# Patient Record
Sex: Female | Born: 1970
Health system: Southern US, Community
[De-identification: ages and names within clinical notes are randomized; demographics above are authoritative.]

## PROBLEM LIST (undated history)

## (undated) DIAGNOSIS — F429 Obsessive-compulsive disorder, unspecified: Secondary | ICD-10-CM

## (undated) DIAGNOSIS — F32A Depression, unspecified: Secondary | ICD-10-CM

## (undated) DIAGNOSIS — F4310 Post-traumatic stress disorder, unspecified: Secondary | ICD-10-CM

## (undated) DIAGNOSIS — F419 Anxiety disorder, unspecified: Secondary | ICD-10-CM

## (undated) DIAGNOSIS — S14106A Unspecified injury at C6 level of cervical spinal cord, initial encounter: Secondary | ICD-10-CM

## (undated) DIAGNOSIS — F329 Major depressive disorder, single episode, unspecified: Secondary | ICD-10-CM

## (undated) HISTORY — PX: BACK SURGERY: SHX140

## (undated) HISTORY — PX: NECK SURGERY: SHX720

---

## 2000-02-01 ENCOUNTER — Other Ambulatory Visit: Admission: RE | Admit: 2000-02-01 | Discharge: 2000-02-03 | Payer: Self-pay

## 2001-04-11 ENCOUNTER — Other Ambulatory Visit: Admission: RE | Admit: 2001-04-11 | Discharge: 2001-04-11 | Payer: Self-pay | Admitting: Internal Medicine

## 2001-06-26 ENCOUNTER — Emergency Department (HOSPITAL_COMMUNITY): Admission: EM | Admit: 2001-06-26 | Discharge: 2001-06-27 | Payer: Self-pay | Admitting: Emergency Medicine

## 2001-06-26 ENCOUNTER — Encounter: Payer: Self-pay | Admitting: *Deleted

## 2001-07-10 ENCOUNTER — Ambulatory Visit (HOSPITAL_COMMUNITY): Admission: RE | Admit: 2001-07-10 | Discharge: 2001-07-10 | Payer: Self-pay | Admitting: General Surgery

## 2001-07-11 ENCOUNTER — Ambulatory Visit (HOSPITAL_COMMUNITY): Admission: RE | Admit: 2001-07-11 | Discharge: 2001-07-11 | Payer: Self-pay | Admitting: General Surgery

## 2001-07-11 ENCOUNTER — Encounter: Payer: Self-pay | Admitting: General Surgery

## 2001-07-18 ENCOUNTER — Encounter: Payer: Self-pay | Admitting: General Surgery

## 2001-07-18 ENCOUNTER — Ambulatory Visit (HOSPITAL_COMMUNITY): Admission: RE | Admit: 2001-07-18 | Discharge: 2001-07-18 | Payer: Self-pay | Admitting: General Surgery

## 2001-08-21 ENCOUNTER — Ambulatory Visit (HOSPITAL_COMMUNITY): Admission: RE | Admit: 2001-08-21 | Discharge: 2001-08-21 | Payer: Self-pay | Admitting: General Surgery

## 2001-08-21 ENCOUNTER — Encounter: Payer: Self-pay | Admitting: General Surgery

## 2001-10-09 ENCOUNTER — Inpatient Hospital Stay (HOSPITAL_COMMUNITY)
Admission: AD | Admit: 2001-10-09 | Discharge: 2002-01-25 | Payer: Self-pay | Admitting: Physical Medicine & Rehabilitation

## 2001-11-14 ENCOUNTER — Encounter: Payer: Self-pay | Admitting: Urology

## 2001-12-28 ENCOUNTER — Encounter: Payer: Self-pay | Admitting: Physical Medicine & Rehabilitation

## 2002-01-10 ENCOUNTER — Encounter: Payer: Self-pay | Admitting: Physical Medicine & Rehabilitation

## 2002-01-17 ENCOUNTER — Encounter: Payer: Self-pay | Admitting: Physical Medicine & Rehabilitation

## 2002-01-25 ENCOUNTER — Encounter: Payer: Self-pay | Admitting: Physical Medicine & Rehabilitation

## 2002-04-04 ENCOUNTER — Ambulatory Visit (HOSPITAL_COMMUNITY): Admission: RE | Admit: 2002-04-04 | Discharge: 2002-04-04 | Payer: Self-pay | Admitting: Internal Medicine

## 2002-04-04 ENCOUNTER — Encounter: Payer: Self-pay | Admitting: Internal Medicine

## 2002-04-17 ENCOUNTER — Encounter: Payer: Self-pay | Admitting: Internal Medicine

## 2002-04-17 ENCOUNTER — Ambulatory Visit (HOSPITAL_COMMUNITY): Admission: RE | Admit: 2002-04-17 | Discharge: 2002-04-17 | Payer: Self-pay | Admitting: Internal Medicine

## 2002-05-23 ENCOUNTER — Emergency Department (HOSPITAL_COMMUNITY): Admission: EM | Admit: 2002-05-23 | Discharge: 2002-05-23 | Payer: Self-pay | Admitting: Emergency Medicine

## 2002-06-07 ENCOUNTER — Emergency Department (HOSPITAL_COMMUNITY): Admission: EM | Admit: 2002-06-07 | Discharge: 2002-06-07 | Payer: Self-pay | Admitting: Emergency Medicine

## 2012-05-04 DIAGNOSIS — H6120 Impacted cerumen, unspecified ear: Secondary | ICD-10-CM | POA: Diagnosis not present

## 2012-05-04 DIAGNOSIS — IMO0002 Reserved for concepts with insufficient information to code with codable children: Secondary | ICD-10-CM | POA: Diagnosis not present

## 2012-08-07 DIAGNOSIS — IMO0002 Reserved for concepts with insufficient information to code with codable children: Secondary | ICD-10-CM | POA: Diagnosis not present

## 2012-08-07 DIAGNOSIS — F3342 Major depressive disorder, recurrent, in full remission: Secondary | ICD-10-CM | POA: Diagnosis not present

## 2013-03-14 DIAGNOSIS — IMO0002 Reserved for concepts with insufficient information to code with codable children: Secondary | ICD-10-CM | POA: Diagnosis not present

## 2013-03-14 DIAGNOSIS — Z79899 Other long term (current) drug therapy: Secondary | ICD-10-CM | POA: Diagnosis not present

## 2013-03-14 DIAGNOSIS — G8929 Other chronic pain: Secondary | ICD-10-CM | POA: Diagnosis not present

## 2013-03-14 DIAGNOSIS — F411 Generalized anxiety disorder: Secondary | ICD-10-CM | POA: Diagnosis not present

## 2013-10-02 DIAGNOSIS — F411 Generalized anxiety disorder: Secondary | ICD-10-CM | POA: Diagnosis not present

## 2013-10-02 DIAGNOSIS — Z6825 Body mass index (BMI) 25.0-25.9, adult: Secondary | ICD-10-CM | POA: Diagnosis not present

## 2014-03-25 DIAGNOSIS — IMO0002 Reserved for concepts with insufficient information to code with codable children: Secondary | ICD-10-CM | POA: Diagnosis not present

## 2014-03-25 DIAGNOSIS — R51 Headache: Secondary | ICD-10-CM | POA: Diagnosis not present

## 2014-03-25 DIAGNOSIS — G8929 Other chronic pain: Secondary | ICD-10-CM | POA: Diagnosis not present

## 2014-08-22 DIAGNOSIS — R51 Headache: Secondary | ICD-10-CM | POA: Diagnosis not present

## 2014-08-22 DIAGNOSIS — G43909 Migraine, unspecified, not intractable, without status migrainosus: Secondary | ICD-10-CM | POA: Diagnosis not present

## 2014-08-28 ENCOUNTER — Emergency Department (HOSPITAL_COMMUNITY)
Admission: EM | Admit: 2014-08-28 | Discharge: 2014-08-28 | Disposition: A | Discharge status: Home / Self Care | Payer: Medicare Other | Attending: Emergency Medicine | Admitting: Emergency Medicine

## 2014-08-28 ENCOUNTER — Encounter (HOSPITAL_COMMUNITY): Payer: Self-pay | Admitting: Emergency Medicine

## 2014-08-28 DIAGNOSIS — S6990XA Unspecified injury of unspecified wrist, hand and finger(s), initial encounter: Secondary | ICD-10-CM | POA: Diagnosis not present

## 2014-08-28 DIAGNOSIS — S41112A Laceration without foreign body of left upper arm, initial encounter: Secondary | ICD-10-CM

## 2014-08-28 DIAGNOSIS — W261XXA Contact with sword or dagger, initial encounter: Secondary | ICD-10-CM | POA: Diagnosis not present

## 2014-08-28 DIAGNOSIS — Z8659 Personal history of other mental and behavioral disorders: Secondary | ICD-10-CM | POA: Diagnosis not present

## 2014-08-28 DIAGNOSIS — T148XXA Other injury of unspecified body region, initial encounter: Secondary | ICD-10-CM | POA: Diagnosis not present

## 2014-08-28 DIAGNOSIS — Z87828 Personal history of other (healed) physical injury and trauma: Secondary | ICD-10-CM | POA: Diagnosis not present

## 2014-08-28 DIAGNOSIS — S51809A Unspecified open wound of unspecified forearm, initial encounter: Secondary | ICD-10-CM | POA: Diagnosis not present

## 2014-08-28 DIAGNOSIS — W260XXA Contact with knife, initial encounter: Secondary | ICD-10-CM | POA: Insufficient documentation

## 2014-08-28 DIAGNOSIS — S59909A Unspecified injury of unspecified elbow, initial encounter: Secondary | ICD-10-CM | POA: Diagnosis not present

## 2014-08-28 DIAGNOSIS — Z23 Encounter for immunization: Secondary | ICD-10-CM | POA: Insufficient documentation

## 2014-08-28 DIAGNOSIS — Y929 Unspecified place or not applicable: Secondary | ICD-10-CM | POA: Insufficient documentation

## 2014-08-28 DIAGNOSIS — Y9389 Activity, other specified: Secondary | ICD-10-CM | POA: Insufficient documentation

## 2014-08-28 HISTORY — DX: Unspecified injury at c6 level of cervical spinal cord, initial encounter: S14.106A

## 2014-08-28 HISTORY — DX: Major depressive disorder, single episode, unspecified: F32.9

## 2014-08-28 HISTORY — DX: Depression, unspecified: F32.A

## 2014-08-28 MED ORDER — BACITRACIN ZINC 500 UNIT/GM EX OINT
TOPICAL_OINTMENT | CUTANEOUS | Status: AC
  Administered 2014-08-28: 1
  Filled 2014-08-28: qty 2.7

## 2014-08-28 MED ORDER — LIDOCAINE-EPINEPHRINE (PF) 1 %-1:200000 IJ SOLN
20.0000 mL | Freq: Once | INTRAMUSCULAR | Status: DC
  Filled 2014-08-28: qty 10

## 2014-08-28 MED ORDER — POVIDONE-IODINE 10 % EX SOLN
CUTANEOUS | Status: DC
Start: 2014-08-28 — End: 2014-08-28
  Filled 2014-08-28: qty 118

## 2014-08-28 MED ORDER — TETANUS-DIPHTH-ACELL PERTUSSIS 5-2.5-18.5 LF-MCG/0.5 IM SUSP
0.5000 mL | Freq: Once | INTRAMUSCULAR | Status: AC
  Administered 2014-08-28: 0.5 mL via INTRAMUSCULAR
  Filled 2014-08-28: qty 0.5

## 2014-08-28 MED ORDER — LIDOCAINE-EPINEPHRINE (PF) 1 %-1:200000 IJ SOLN
INTRAMUSCULAR | Status: AC
  Filled 2014-08-28: qty 10

## 2014-08-28 NOTE — ED Notes (Signed)
Non-adherent dressing with Kerlix applied to sutured wound.

## 2014-08-28 NOTE — ED Notes (Signed)
Laceration to left forearm with knife while cutting potato.  Bleeding controlled.

## 2014-08-28 NOTE — ED Provider Notes (Signed)
TIME SEEN: 11:48 AM  This chart was scribed for Natasha Maw Ward, DO by Annye Asa, ED Scribe. This patient was seen in room APA07/APA07 and the patient's care was started at 11:48 PM.   CHIEF COMPLAINT: laceration to the left forearm  HPI:  HPI Comments: Natasha [name] is a 43 y.o. female with a history of a C6 spinal cord injury who presents to the Emergency Department complaining of laceration on her left forearm that occurred PTA. Patient notes that she was peeling potatoes and slipped accidentally, cutting her forearm. There is associated controlled bleeding. She denies any other injury. She has a history of spinal cord injury and reports a baseline weakness in her upper extremities. Because of this weakness she has to cut vegetables with them against her arm and she cannot hold them with her hand. Patient notes that she is not UTD on her TDP vaccine. She reports that she has a history of depression but explained that this was an intentional. No SI or HI. No new focal numbness or weakness.   ROS: See HPI Constitutional: no fever  Eyes: no drainage  ENT: no runny nose   Cardiovascular:  no chest pain  Resp: no SOB  GI: no vomiting GU: no dysuria Integumentary: no rash  Allergy: no hives  Musculoskeletal: no leg swelling  Neurological: no slurred speech ROS otherwise negative  PAST MEDICAL HISTORY/PAST SURGICAL HISTORY:  Past Medical History  Diagnosis Date  . C6 spinal cord injury   . Depression     MEDICATIONS:  Prior to Admission medications   Not on File    ALLERGIES:  No Known Allergies  SOCIAL HISTORY:  History  Substance Use Topics  . Smoking status: Never Smoker   . Smokeless tobacco: Not on file  . Alcohol Use: No    FAMILY HISTORY: No family history on file.  EXAM: BP 119/93  Pulse 92  Temp(Src) 98.2 F (36.8 C) (Oral)  Resp 16  Ht 5' (1.524 m)  Wt 125 lb (56.7 kg)  BMI 24.41 kg/m2  SpO2 100%  LMP 08/14/2014 CONSTITUTIONAL: Alert and  oriented and responds appropriately to questions. Well-appearing; well-nourished HEAD: Normocephalic EYES: Conjunctivae clear, PERRL ENT: normal nose; no rhinorrhea; moist mucous membranes; pharynx without lesions noted NECK: Supple, no meningismus, no LAD  CARD: RRR; S1 and S2 appreciated; no murmurs, no clicks, no rubs, no gallops RESP: Normal chest excursion without splinting or tachypnea; breath sounds clear and equal bilaterally; no wheezes, no rhonchi, no rales,  ABD/GI: Normal bowel sounds; non-distended; soft, non-tender, no rebound, no guarding BACK:  The back appears normal and is non-tender to palpation, there is no CVA tenderness EXT: Normal ROM in all joints; non-tender to palpation; no edema; normal capillary refill; no cyanosis    SKIN: Normal color for age and race; warm, 4 cm superficial laceration on the volar aspect of the proximal left forearm that is hemostatic; normal sensation throughout the extremity; slightly decreased grip strength (baseline due to previous spinal injury); 2+ radial pulses bilaterally NEURO: Moves all extremities equally PSYCH: The patient's mood and manner are appropriate. Grooming and personal hygiene are appropriate. No SI or HI. Contracts for safety.  MEDICAL DECISION MAKING: Patient here with laceration to her left upper extremity. Wound is hemostatic and clean. Have cleaned the wound, irrigated and sutured using 9 prolene simple interrupted sutures.  Have updated patient's tetanus. No other injuries on exam. She is at her neurologic baseline. Normal pulses and capillary refill. We'll discharge him  with instructions for wound care and return precautions. She verbalized understanding and is comfortable with plan.    LACERATION REPAIR PROCEDURE NOTE The patient's identification was confirmed and consent was obtained. This procedure was performed by Natasha Maw Ward, DO at 12:15 PM. Site: Left volar forearm Sterile procedures observed Anesthetic used  (type and amt): 10 mL 1% lidocaine with epinephrine Suture type/size: 3-0 and 5-0 prolene Length:4 cm # of Sutures: 9 Technique: Simple interrupted Complexity superficial Antibx ointment applied Tetanus ordered Site anesthetized, irrigated with NS, explored without evidence of foreign body, wound well approximated, site covered with dry, sterile dressing.  Patient tolerated procedure well without complications. Instructions for care discussed verbally and patient provided with additional written instructions for homecare and f/u.     I personally performed the services described in this documentation, which was scribed in my presence. The recorded information has been reviewed and is accurate.    Natasha Maw Ward, DO 08/28/14 1453

## 2014-08-28 NOTE — ED Notes (Signed)
MD at bedside. 

## 2014-08-28 NOTE — Discharge Instructions (Signed)

## 2014-08-28 NOTE — ED Notes (Signed)
Pt presents to ED with an approx 2 cm laceration to LT upper forearm. Bleeding controlled at this time, cap refill brisk with strong radial pulse. NAD noted.

## 2014-09-06 DIAGNOSIS — IMO0002 Reserved for concepts with insufficient information to code with codable children: Secondary | ICD-10-CM | POA: Diagnosis not present

## 2014-09-06 DIAGNOSIS — M62838 Other muscle spasm: Secondary | ICD-10-CM | POA: Diagnosis not present

## 2014-09-06 DIAGNOSIS — Z4802 Encounter for removal of sutures: Secondary | ICD-10-CM | POA: Diagnosis not present

## 2015-01-09 DIAGNOSIS — Z6824 Body mass index (BMI) 24.0-24.9, adult: Secondary | ICD-10-CM | POA: Diagnosis not present

## 2015-01-09 DIAGNOSIS — F419 Anxiety disorder, unspecified: Secondary | ICD-10-CM | POA: Diagnosis not present

## 2015-01-09 DIAGNOSIS — G43909 Migraine, unspecified, not intractable, without status migrainosus: Secondary | ICD-10-CM | POA: Diagnosis not present

## 2015-05-08 DIAGNOSIS — F419 Anxiety disorder, unspecified: Secondary | ICD-10-CM | POA: Diagnosis not present

## 2015-05-08 DIAGNOSIS — Z6824 Body mass index (BMI) 24.0-24.9, adult: Secondary | ICD-10-CM | POA: Diagnosis not present

## 2015-06-09 ENCOUNTER — Inpatient Hospital Stay (HOSPITAL_COMMUNITY)
Admission: AD | Admit: 2015-06-09 | Discharge: 2015-06-25 | DRG: 885 | Disposition: A | Discharge status: Home / Self Care | Payer: Medicare Other | Attending: Psychiatry | Admitting: Psychiatry

## 2015-06-09 ENCOUNTER — Encounter (HOSPITAL_COMMUNITY): Payer: Self-pay | Admitting: *Deleted

## 2015-06-09 DIAGNOSIS — F322 Major depressive disorder, single episode, severe without psychotic features: Secondary | ICD-10-CM

## 2015-06-09 DIAGNOSIS — F332 Major depressive disorder, recurrent severe without psychotic features: Secondary | ICD-10-CM | POA: Diagnosis not present

## 2015-06-09 DIAGNOSIS — G4700 Insomnia, unspecified: Secondary | ICD-10-CM | POA: Diagnosis present

## 2015-06-09 DIAGNOSIS — F410 Panic disorder [episodic paroxysmal anxiety] without agoraphobia: Secondary | ICD-10-CM | POA: Diagnosis present

## 2015-06-09 DIAGNOSIS — G43909 Migraine, unspecified, not intractable, without status migrainosus: Secondary | ICD-10-CM | POA: Diagnosis present

## 2015-06-09 DIAGNOSIS — F329 Major depressive disorder, single episode, unspecified: Secondary | ICD-10-CM | POA: Diagnosis present

## 2015-06-09 DIAGNOSIS — R45851 Suicidal ideations: Secondary | ICD-10-CM | POA: Diagnosis not present

## 2015-06-09 MED ORDER — ALUM & MAG HYDROXIDE-SIMETH 200-200-20 MG/5ML PO SUSP
30.0000 mL | ORAL | Status: DC | PRN
  Administered 2015-06-13 – 2015-06-25 (×2): 30 mL via ORAL
  Filled 2015-06-09 (×2): qty 30

## 2015-06-09 MED ORDER — ACETAMINOPHEN 325 MG PO TABS: 650.0000 mg | ORAL_TABLET | Freq: Four times a day (QID) | ORAL | Status: DC | PRN

## 2015-06-09 MED ORDER — MAGNESIUM HYDROXIDE 400 MG/5ML PO SUSP: 30.0000 mL | Freq: Every day | ORAL | Status: DC | PRN

## 2015-06-09 MED ORDER — TRAZODONE HCL 50 MG PO TABS
50.0000 mg | ORAL_TABLET | Freq: Every evening | ORAL | Status: DC | PRN
  Administered 2015-06-09 – 2015-06-10 (×2): 50 mg via ORAL
  Filled 2015-06-09 (×6): qty 1

## 2015-06-09 MED ORDER — TRAZODONE HCL 50 MG PO TABS
50.0000 mg | ORAL_TABLET | Freq: Every day | ORAL | Status: DC
  Administered 2015-06-09: 50 mg via ORAL
  Filled 2015-06-09 (×2): qty 1

## 2015-06-09 NOTE — Progress Notes (Signed)
[facility] Group Notes:  (Nursing/MHT/Case Management/Adjunct)  Date:  06/09/2015  Time:  8:56 PM  Type of Therapy:  Psychoeducational Skills  Participation Level:  Active  Participation Quality:  Appropriate  Affect:  Appropriate  Cognitive:  Appropriate  Insight:  Appropriate  Engagement in Group:  Engaged  Modes of Intervention:  Discussion  Summary of Progress/Problems: Tonight in wrap up group Victorino DikeJennifer said that today started low for her but she was brought here and she realizes she needs the help so she wants to stay positive about that. She mentioned how everyone (staff and patients) are extremely nice to her so that has helped her to get adjusted.  Madaline SavageDiamond N Criston Chancellor 06/09/2015, 8:56 PM

## 2015-06-09 NOTE — Progress Notes (Signed)
D: Patient in room on approach. Pt mood and affect appeared depressed and flat. Pt report she has been off her medication for about a year. Pt reports she wanted to see how she can do off her medication. Pt reports history of suicide attempt and does not want to progress to an actual plan to hurt herself. Pt verbally contract to come to staff if feeling unsafe. Pt denies HI/AVH and pain. Pt attended evening wrap up group and engage in discussion. Pt denies any needs or concerns.  Cooperative with assessment.    A: Met with pt 1:1. Medications administered as prescribed. Support and encouragement provided. Pt encouraged to discuss feelings and come to staff with any question or concerns.   R: Patient  is safe and complaint with medications.

## 2015-06-09 NOTE — Progress Notes (Signed)
D: Pt is 44 y/o caucasian female [facility] with depression and SI. Pt Presents with flat affect and depressed mood on initial contact. Pt endorsed SI, verbally contracts for safety while in hospital, stated "I don't feel safe at home". Pt denied HI and AVH.  Pt has a h/o MDD for 23 years with cutting and past suicide attempts. Per chart and as confirmed with pt, last suicide attempt was in 2002 when she jumped off a parking garage [facility] which resulted in physical limitations due to a Spinal Cord Injury. Pt reported not seeing her psychiatrist X 6 months or greater, stated she was feeling good so she stopped seeing him and she was having problems with transportation as well due to her mother being ill with parkinson disease and was worried about safety as her mother is her main source of transport.  A: Skin assessment and search done as per protocol. Unresolved redness noted on right foot for which pt has been using vicks vapor rubb. One tattoo noted on left ankle. No contraband found in pt's belongings. Availability and emotional support offered. Pt encouraged to voice needs and concerns to promote safety. Unit orientation and schedules discussed with pt. Supper and fluids given. Q 15 minutes checks maintained for safety as ordered and pt monitored as such without falls or outburst thus far. R: Pt receptive to care. Verbalized understanding on education and admission packets. Denies adverse medication reactions from home meds (Klonopin, Flexaril and Immitrex). Remains safe on unit. Continue POC.

## 2015-06-09 NOTE — Tx Team (Signed)
Initial Interdisciplinary Treatment Plan   PATIENT STRESSORS: Financial difficulties Health problems Medication change or noncompliance Traumatic event---Death of father last week.   PATIENT STRENGTHS: Ability for insight Average or above average intelligence Capable of independent living Communication skills General fund of knowledge Motivation for treatment/growth Work skills   PROBLEM LIST: Problem List/Patient Goals Date to be addressed Date deferred Reason deferred Estimated date of resolution  "I need to get back on my medicines, I've not seen a psychiatrist in 6 months" 06-09-15     Alteration in mood (Depression) 06-09-15     Risk of self injurious behavior (SI, previous attempts, cutting) 06-09-15                                          DISCHARGE CRITERIA:  Ability to meet basic life and health needs Adequate post-discharge living arrangements Improved stabilization in mood, thinking, and/or behavior Medical problems require only outpatient monitoring Motivation to continue treatment in a less acute level of care Reduction of life-threatening or endangering symptoms to within safe limits Verbal commitment to aftercare and medication compliance  PRELIMINARY DISCHARGE PLAN: Outpatient therapy Return to previous living arrangement  PATIENT/FAMIILY INVOLVEMENT: This treatment plan has been presented to and reviewed with the patient, Natasha [name].  Sherryl Manges 06/09/2015, 7:34 PM
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
## 2015-06-10 ENCOUNTER — Encounter (HOSPITAL_COMMUNITY): Payer: Self-pay | Admitting: Psychiatry

## 2015-06-10 DIAGNOSIS — R45851 Suicidal ideations: Secondary | ICD-10-CM

## 2015-06-10 DIAGNOSIS — F332 Major depressive disorder, recurrent severe without psychotic features: Principal | ICD-10-CM

## 2015-06-10 LAB — URINALYSIS W MICROSCOPIC (NOT AT ARMC)
Bilirubin Urine: NEGATIVE
Glucose, UA: NEGATIVE mg/dL
Hgb urine dipstick: NEGATIVE
Ketones, ur: >80 mg/dL — AB
Leukocytes, UA: NEGATIVE
Nitrite: NEGATIVE
Protein, ur: NEGATIVE mg/dL
Specific Gravity, Urine: 1.016 (ref 1.005–1.030)
Urobilinogen, UA: 0.2 mg/dL (ref 0.0–1.0)
pH: 6 (ref 5.0–8.0)

## 2015-06-10 MED ORDER — BUPROPION HCL ER (XL) 150 MG PO TB24
150.0000 mg | ORAL_TABLET | Freq: Every day | ORAL | Status: DC
  Administered 2015-06-10 – 2015-06-13 (×4): 150 mg via ORAL
  Filled 2015-06-10 (×7): qty 1

## 2015-06-10 MED ORDER — CLONAZEPAM 1 MG PO TABS
1.0000 mg | ORAL_TABLET | Freq: Two times a day (BID) | ORAL | Status: DC
  Administered 2015-06-10 – 2015-06-11 (×2): 1 mg via ORAL
  Filled 2015-06-10 (×2): qty 1

## 2015-06-10 MED ORDER — SUMATRIPTAN SUCCINATE 25 MG PO TABS
25.0000 mg | ORAL_TABLET | Freq: Two times a day (BID) | ORAL | Status: DC | PRN
  Administered 2015-06-10 – 2015-06-25 (×15): 25 mg via ORAL
  Filled 2015-06-10 (×15): qty 1
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
## 2015-06-10 NOTE — Progress Notes (Signed)
[facility] Group Notes:  (Nursing/MHT/Case Management/Adjunct)  Date:  06/10/2015  Time:  8:53 PM  Type of Therapy:  Psychoeducational Skills  Participation Level:  Active  Participation Quality:  Appropriate  Affect:  Appropriate  Cognitive:  Appropriate  Insight:  Good  Engagement in Group:  Engaged  Modes of Intervention:  Discussion  Summary of Progress/Problems: Tonight in wrap up group Marsia stated that her day was very up and down for her. Right now she said she's at a 4, being around the group she said she does better. She said she talked a lot more today and the therapy dog was a plus as well.  Madaline SavageDiamond N Keitha Kolk 06/10/2015, 8:53 PM

## 2015-06-10 NOTE — [facility] Suicide Risk Assessment (Signed)
[facility] Admission Suicide Risk Assessment   Nursing information obtained from:    Demographic factors:   44 year old female , lives with mother  Current Mental Status:   see below Loss Factors:   neurologic sequelae from spinal injury, loss of father, strain with brother , mother has developed Parkinson's  Historical Factors:   Depression, history of severe suicide attempt in the past  Risk Reduction Factors:   resilience  Total Time spent with patient: 45 minutes Principal Problem:  Major Depression Diagnosis:   Patient Active Problem List   Diagnosis Date Noted  . MDD (major depressive disorder) [F32.2] 06/09/2015     Continued Clinical Symptoms:  Alcohol Use Disorder Identification Test Final Score (AUDIT): 1 The "Alcohol Use Disorders Identification Test", Guidelines for Use in Primary Care, Second Edition.  World Science writer [facility]). Score between 0-7:  no or low risk or alcohol related problems. Score between 8-15:  moderate risk of alcohol related problems. Score between 16-19:  high risk of alcohol related problems. Score 20 or above:  warrants further diagnostic evaluation for alcohol dependence and treatment.   CLINICAL FACTORS:  44 year old female. On disability. Lives with mother .  States she has a history of severe depression and in 2002 had a serious suicide attempt by jumping off a garage deck, sustaining spinal cord injury which left her quadriplegic for a period of time. She has gradually improved, but  Still has quadriparesia, worse on hands. States she had been " doing OK" but has had  A series of recent stressors which has resulted in worsening depression. Her mother, with whom she lives, has developed Parkinson's and needed a brief stay in a Nursing Home. Her father, with whom she had a strained relationship, died earlier this year 04/10/23 ).  She states she developed worsening depression, some passive thoughts of dying. Denies hallucinations  She had a history  of  Responding well to Wellbutrin XL in the past, and had actually stopped medication several years ago, because she felt better, and " at the time did not think I needed medications any longer".  She has no history of seizures or eating disorder . She has been managed with Klonopin for anxiety prior to admission- denies side effects- denies abuse. States she takes 1 mgr BID ,which is less than prescribed . Dx- Major Depression, severe , recurrent, without psychotic features, quadriparesia from spinal cord injury 2002.  Plan- start Wellbutrin XL 150 mgrs QDAY- side effects reviewed, continue Klonopin 1 mgr BID, patient ambulates independently with cane .      Musculoskeletal: Strength & Muscle Tone: decreased Gait & Station: slow gait, but describes it as steady - uses cane  Patient leans: N/A  Psychiatric Specialty Exam: Physical Exam  Review of Systems  Constitutional: Negative.   HENT: Negative.   Eyes: Negative.   Cardiovascular: Negative.   Gastrointestinal: Negative.   Genitourinary: Negative.   Musculoskeletal: Negative.   Skin: Negative.   Neurological: Positive for focal weakness.  Endo/Heme/Allergies: Negative.   Psychiatric/Behavioral: Positive for depression and suicidal ideas.  all other systems negative   Blood pressure 109/79, pulse 101, temperature 98.6 F (37 C), temperature source Oral, resp. rate 14, height  (1.575 m), weight 135 lb (61.236 kg), last menstrual period 06/02/2015, SpO2 98 %.Body mass index is 24.69 kg/(m^2).  General Appearance: Fairly Groomed  Patent attorney::  Good  Speech:  Normal Rate  Volume:  Normal  Mood:  Depressed  Affect:  Constricted  and but reactive   Thought Process:  Goal Directed and Linear  Orientation:  Full (Time, Place, and Person)  Thought Content:  denies hallucinations, no delusions  Suicidal Thoughts:  Yes.  without intent/plan- denies any current plan or intention of hurting self/killing self.   Homicidal Thoughts:   No  Memory:  recent and remote grossly intact  Judgement:  Fair  Insight:  Present  Psychomotor Activity:  Decreased  Concentration:  Good  Recall:  Good  Fund of Knowledge:Good  Language: Good  Akathisia:  Negative  Handed:  Right  AIMS (if indicated):     Assets:  Communication Skills Desire for Improvement Resilience  Sleep:     Cognition: WNL  ADL's:  Fair      COGNITIVE FEATURES THAT CONTRIBUTE TO RISK:  Loss of executive function    SUICIDE RISK:   Moderate:  Frequent suicidal ideation with limited intensity, and duration, some specificity in terms of plans, no associated intent, good self-control, limited dysphoria/symptomatology, some risk factors present, and identifiable protective factors, including available and accessible social support.  PLAN OF CARE: Patient will be admitted to inpatient psychiatric unit for stabilization and safety. Will provide and encourage milieu participation. Provide medication management and maked adjustments as needed.  Will follow daily.    Medical Decision Making:  Review of Psycho-Social Stressors (1), Review or order clinical lab tests (1), Established Problem, Worsening (2) and Review of Medication Regimen & Side Effects (2)  I certify that inpatient services furnished can reasonably be expected to improve the patient's condition.   COBOS, FERNANDO 06/10/2015, 5:50 PM
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
## 2015-06-10 NOTE — Progress Notes (Signed)
Recreation Therapy Notes  Animal-Assisted Activity (AAA) Program Checklist/Progress Notes Patient Eligibility Criteria Checklist & Daily Group note for Rec Tx Intervention  Date: 06.28.16 Time: 2:45 pm Location: [address] Dayroom  AAA/T Program Assumption of Risk Form signed by Patient/ or Parent Legal Guardian yes  Patient is free of allergies or sever asthma yes  Patient reports no fear of animals yes  Patient reports no history of cruelty to animalsyes  Patient understands his/her participation is voluntary yes  Patient washes hands before animal contact yes  Patient washes hands after animal contact yes  Behavioral Response: Engaged  Education: Hand Washing, Appropriate Animal Interaction   Education Outcome: Acknowledges understanding/In group clarification offered  Clinical Observations/Feedback: Patient attended group.   Nyaira Hodgens, LRT/CTRS         Dewan Emond A 06/10/2015 4:03 PM 

## 2015-06-10 NOTE — Plan of Care (Signed)
Problem: Diagnosis: Increased Risk For Suicide Attempt Goal: STG-Patient Will Attend All Groups On The Unit Outcome: Progressing Pt seem upbeat and attended evening wrap up group and engage in discussion.

## 2015-06-10 NOTE — Tx Team (Signed)
Interdisciplinary Treatment Plan Update (Adult)  Date:  06/10/2015  Time Reviewed:  1:24 PM   Progress in Treatment: Attending groups: Patient is attending groups. Participating in groups:  Patient engages in discussion Taking medication as prescribed:  Patient is taking medications Tolerating medication:  Patient is tolerating medications Family/Significant othe contact made:   No, will asked for consent to make collateral contact Patient understands diagnosis:Yes, patient understands diagnosis and need for treatment Discussing patient identified problems/goals with staff:  Yes, patient is able to express goals/problems Medical problems stabilized or resolved:  Yes Denies suicidal/homicidal ideation: Yes, patient is denying SI/HI. Issues/concerns per patient self-inventory:   Other:  Discharge Plan or Barriers:  To be determined  Reason for Continuation of Hospitalization: Anxiety Depression Medication stabilization Suicidal ideation  Comments:  Continue medication stabilization  Additional comments:  Patient and CSW reviewed Patient Discharge Process Letter/Patient Involvement Form.  Patient verbalized understanding and signed form.  Patient and CSW also reviewed and identified patient's goals and treatment plan.  Patient verbalized understanding and agreed to plan.  Estimated length of stay:  New goal(s):  Review of initial/current patient goals per problem list:  Please see plan of careInterdisciplinary Treatment Plan Update (Adult)  Attendees: Patient 06/10/2015 1:24 PM   Family:   06/10/2015 1:24 PM   Physician:  Nehemiah MassedFernando Cobos, MD 06/10/2015 1:24 PM   Nursing:   Earl ManySara Twyman, RN 06/10/2015 1:24 PM   Clinical Social Worker:  Juline PatchQuylle Graclyn Lawther, LCSW 06/10/2015 1:24 PM   Clinical Social Worker:  Belenda CruiseKristin Drinkard, LCSW-A 06/10/2015 1:24 PM   Case Manager:  Onnie BoerJennifer [name], RN 06/10/2015 1:24 PM   Other:  Liborio NixonPatrice White, RN 06/10/2015 1:24 PM  Other:   06/10/2015  1:24 PM   Other:   06/10/2015 1:24 PM   Other:  06/10/2015 1:24 PM   Other:  06/10/2015 1:24 PM   Other:  Chad CordialValerie Enoch, [facility] Transition Team Coordinator 06/10/2015 1:24 PM   Other:   06/10/2015 1:24 PM   Other:  06/10/2015 1:24 PM   Other:   06/10/2015 1:24 PM    Scribe for Treatment Team:   Wynn BankerHodnett, Vala Raffo Hairston, 06/10/2015   1:24 PM

## 2015-06-10 NOTE — [facility] Group Notes (Signed)
[facility] LCSW Group Therapy      Feelings About Diagnosis 1:15 - 2:30 PM         06/10/2015    Type of Therapy:  Group Therapy  Participation Level:  Active  Participation Quality:  Appropriate  Affect:  Appropriate  Cognitive:  Alert and Appropriate  Insight:  Developing/Improving and Engaged  Engagement in Therapy:  Developing/Improving and Engaged  Modes of Intervention:  Discussion, Education, Exploration, Problem-Solving, Rapport Building, Support  Summary of Progress/Problems:  Patient actively participated in group. Patient discussed past and present diagnosis and the effects it has had on  life.  Patient talked about family and society being judgmental and the stigma associated with having a mental health diagnosis.  She shared she feels her illness has caused her to miss out on meeting goals she had for her life.  She stated she has often felt labeled and less than others.  She is hopeful that someday she can move forward and work toward her educational goals again.  Wynn BankerHodnett, Hatice Bubel Hairston 06/10/2015

## 2015-06-10 NOTE — Progress Notes (Signed)
Patient ID: Natasha SartoriusJenifer K [name], female   DOB: Feb 18, 1971, 44 y.o.   MRN: [id number] PER STATE REGULATIONS 482.30  THIS CHART WAS REVIEWED FOR MEDICAL NECESSITY WITH RESPECT TO THE PATIENT'S ADMISSION/DURATION OF STAY.  NEXT REVIEW DATE: 06/13/15  Loura HaltBARBARA Edrik Rundle, RN, BSN CASE MANAGER

## 2015-06-10 NOTE — Progress Notes (Signed)
Pt attended spiritual care group on grief and loss facilitated by chaplain Burnis KingfisherMatthew Jarion Hawthorne. Group opened with brief discussion and psycho-social ed around grief and loss in relationships and in relation to self - identifying life patterns, circumstances, changes that cause losses. Established group norm of speaking from own life experience. Group goal of establishing open and affirming space for members to share loss and experience with grief, normalize grief experience and provide psycho social education and grief support.  Group drew on narrative and Alderian therapeutic modalities.  Demetric spoke with group about the death of her father, describing anger and grief around who she wished her father could have been and the childhood / relationship she wish she could have had with him.  Asked the group whether it was "normal" to grief losses of "what could have been."  She received affirmation / normalization from the group.  Described feeling particularly isolated in her grief, because others in her family and community experienced her father differently.  That is, they did not know how painful her relationship with him was.   In the course of group she described his loss feeling surreal, feeling as though she "shouldn't" be angry, feeling frustration around the unfairness of death - that two of her friends who "were good people" died early in their life.   Diem was surprised that her father died three months ago, but the death feels like last week.  The facilitator and group normalized her experience, provided emapathic presence.  Several group members spoke about what keeps them going in places of isolation.     Maquita was called from room for treatment team.  She was not able to return before end of group.       06/10/15 1400  Clinical Encounter Type  Visited With Patient;Other (Comment) (Group)  Visit Type Behavioral Health;Psychological support;Spiritual support;Other (Comment) (Group)   Spiritual Encounters  Spiritual Needs Grief support;Emotional  Stress Factors  Patient Stress Factors Loss;Family relationships

## 2015-06-10 NOTE — Progress Notes (Signed)
D: Patient in dayroom interacting with peers. Pt reports her day was well. Pt reports meeting with the treatment team has given her hope that things are going in the right direction. Pt reports she happy to be back on her Klonopin. Pt mood and affect appeared depressed and flat. Pt denies SI/HI/AVH and pain verbally contract to come to staff if feeling unsafe. Pt attended evening wrap up group and engage in discussion. Pt denies any needs or concerns.  Cooperative with assessment.    A: Met with pt 1:1. Medications administered as prescribed. Support and encouragement provided. Pt encouraged to discuss feelings and come to staff with any question or concerns.   R: Patient  is safe and complaint with medications.

## 2015-06-10 NOTE — Progress Notes (Signed)
Patient ID: Natasha SartoriusJenifer K [name], female   DOB: [birth year], 44 y.o.   MRN: [id number]   Pt currently presents with a flat affect and depressed behavior. Per self inventory, pt rates depression at a 7, hopelessness 7 and anxiety 12. Pt's daily goal is to "talk to dr/resources" and they intend to do so by "be attentive." Pt reports poor sleep, good concentration, low energy and a fair appetite.   Pt provided with medications per providers orders. Pt's labs and vitals were monitored throughout the day. Pt supported emotionally and encouraged to express concerns and questions. Pt educated on medications.  Pt's safety ensured with 15 minute and environmental checks. Pt currently denies SI/HI and A/V hallucinations. Pt verbally agrees to seek staff if SI/HI or A/VH occurs and to consult with staff before acting on these thoughts. Pt refuses to go to cafeteria for lunch today and states "I'm just not hungry." Pt also refuses ensures and says, "I just really don't like them. I'll think about, okay?" Will continue POC.

## 2015-06-10 NOTE — Tx Team (Deleted)
Initial Interdisciplinary Treatment Plan   PATIENT STRESSORS: Financial difficulties Health problems Medication change or noncompliance   PATIENT STRENGTHS: Ability for insight Average or above average intelligence General fund of knowledge   PROBLEM LIST: Problem List/Patient Goals Date to be addressed Date deferred Reason deferred Estimated date of resolution  depression 06/10/2015     Suicidal ideation 06/10/2015     ( I don't feel safe " 06/10/2015     Medication noncompliant 06/10/2015                                    DISCHARGE CRITERIA:  Ability to meet basic life and health needs Improved stabilization in mood, thinking, and/or behavior Medical problems require only outpatient monitoring Motivation to continue treatment in a less acute level of care Verbal commitment to aftercare and medication compliance  PRELIMINARY DISCHARGE PLAN: Outpatient therapy Placement in alternative living arrangements Return to previous living arrangement  PATIENT/FAMIILY INVOLVEMENT: This treatment plan has been presented to and reviewed with the patient, Natasha [name], The patient and family have been given the opportunity to ask questions and make suggestions.  JEHU-APPIAH, Nochum Fenter K 06/10/2015, 5:31 AM

## 2015-06-10 NOTE — Progress Notes (Signed)
Patient ID: [name], female   DOB: 1971-07-09, 44 y.o.   MRN: [id number]  Adult Psychoeducational Group Note  Date:  06/10/2015 Time: 09:00am  Group Topic/Focus:  Orientation:   The focus of this group is to educate the patient on the purpose and policies of crisis stabilization and provide a format to answer questions about their admission.  The group details unit policies and expectations of patients while admitted.  Participation Level:  Active  Participation Quality:  Appropriate and Attentive  Affect:  Flat  Cognitive:  Alert, Appropriate and Oriented  Insight: Improving  Engagement in Group:  Engaged  Modes of Intervention:  Discussion, Education, Orientation and Support  Additional Comments:  Pt able to identify one daily goal to accomplish today.   Aurora Maskwyman, Dylan Monforte E 06/10/2015, 2:56 PM

## 2015-06-11 LAB — CBC WITH DIFFERENTIAL/PLATELET
Basophils Absolute: 0 10*3/uL (ref 0.0–0.1)
Basophils Relative: 0 % (ref 0–1)
Eosinophils Absolute: 0 10*3/uL (ref 0.0–0.7)
Eosinophils Relative: 0 % (ref 0–5)
HCT: 43.1 % (ref 36.0–46.0)
Hemoglobin: 14.7 g/dL (ref 12.0–15.0)
Lymphocytes Relative: 23 % (ref 12–46)
Lymphs Abs: 2.1 10*3/uL (ref 0.7–4.0)
MCH: 29.3 pg (ref 26.0–34.0)
MCHC: 34.1 g/dL (ref 30.0–36.0)
MCV: 86 fL (ref 78.0–100.0)
Monocytes Absolute: 0.6 10*3/uL (ref 0.1–1.0)
Monocytes Relative: 7 % (ref 3–12)
Neutro Abs: 6.3 10*3/uL (ref 1.7–7.7)
Neutrophils Relative %: 70 % (ref 43–77)
Platelets: UNDETERMINED 10*3/uL (ref 150–400)
RBC: 5.01 MIL/uL (ref 3.87–5.11)
RDW: 13.3 % (ref 11.5–15.5)
WBC: 9 10*3/uL (ref 4.0–10.5)

## 2015-06-11 LAB — TSH: TSH: 4.924 u[IU]/mL — ABNORMAL HIGH (ref 0.350–4.500)

## 2015-06-11 LAB — BASIC METABOLIC PANEL
Anion gap: 12 (ref 5–15)
BUN: 11 mg/dL (ref 6–20)
CO2: 18 mmol/L — ABNORMAL LOW (ref 22–32)
Calcium: 8.8 mg/dL — ABNORMAL LOW (ref 8.9–10.3)
Chloride: 105 mmol/L (ref 101–111)
Creatinine, Ser: 0.62 mg/dL (ref 0.44–1.00)
GFR calc Af Amer: >60 mL/min (ref >60)
GFR calc non Af Amer: >60 mL/min (ref >60)
Glucose, Bld: 77 mg/dL (ref 65–99)
Potassium: 4 mmol/L (ref 3.5–5.1)
Sodium: 135 mmol/L (ref 135–145)

## 2015-06-11 MED ORDER — CLONAZEPAM 1 MG PO TABS: 2.0000 mg | ORAL_TABLET | Freq: Every day | ORAL | Status: DC

## 2015-06-11 MED ORDER — CLONAZEPAM 1 MG PO TABS
2.0000 mg | ORAL_TABLET | Freq: Every day | ORAL | Status: DC
  Administered 2015-06-11 – 2015-06-24 (×14): 2 mg via ORAL
  Filled 2015-06-11 (×14): qty 2

## 2015-06-11 NOTE — Progress Notes (Signed)
Pt has refused to eat meals today. Pt refused breakfast, lunch and dinner. Pt also refused snacks. Pt is consuming fluids.

## 2015-06-11 NOTE — Progress Notes (Signed)
Adult Psychoeducational Group Note  Date:  06/11/2015 Time:  9:10 PM  Group Topic/Focus:  Wrap-Up Group:   The focus of this group is to help patients review their daily goal of treatment and discuss progress on daily workbooks.  Participation Level:  Active  Participation Quality:  Appropriate  Affect:  Appropriate  Cognitive:  Appropriate  Insight: Appropriate  Engagement in Group:  Engaged  Modes of Intervention:  Discussion  Additional Comments: The expressed that she attend group.The patient also said that the group made here day great. Octavio Mannshigpen, Graycen Degan Lee 06/11/2015, 9:10 PM

## 2015-06-11 NOTE — Progress Notes (Signed)
Recreation Therapy Notes  Date: 06.29.16 Time: 9:30 am Location: [address] Dayroom  Group Topic: Stress Management  Goal Area(s) Addresses:  Patient will verbalize importance of using healthy stress management.  Patient will identify positive emotions associated with healthy stress management.   Intervention: Stress Management  Activity : Progressive Muscle Relaxation. LRT introduced and educated patients on stress management technique of progressive muscle relaxation. A script was used to to deliver the technique to patients. Patients were asked to follow script read aloud by LRT to engage in the stress management technique.  Education: Stress Management, Discharge Planning.   Education Outcome: Acknowledges edcuation/In group clarification offered/Needs additional education  Clinical Observations/Feedback: Patient did not attend group.  Caroll RancherMarjette Ruhi Kopke , LRT/CTRS         Caroll RancherLindsay, [name] A 06/11/2015 3:15 PM

## 2015-06-11 NOTE — Progress Notes (Signed)
Patient ID: Natasha SartoriusJenifer K Sobieski, female   DOB: 01/28/1971, 44 y.o.   MRN: [id number]  D: Patient pleasant on approach. Reports feeling better tonight and is starting to feel tired early. Hoping with the increase in Klonopin that she will sleep well tonight. Reports had some self harm thoughts earlier today but not at present. Using crutch and wheelchair as needed. A: Staff will monitor on q 15 minute checks, follow treatment plan, and give meds as ordered. R: Cooperative on the unit. Wanted to go to bed early while she was tired

## 2015-06-11 NOTE — Progress Notes (Signed)
D: Pt presents with flat affect and depressed mood. Pt rates depression 5/10. Anxiety 4/10. Hopeless 7/10. Pt reported self harm thoughts of cutting. Pt denies suicidal thoughts. Pt verbally contracts not to harm self. Pt reported that her self harm thoughts worsens at bedtime when she is all alone. Pt reported poor sleep and stated that the trazodone makes her feel restless. Pt requesting to take klonopin at bedtime. Dr. Jama Flavorsobos made aware of pt request. Pt compliant with attending groups.  A: Medications administered as ordered per MD. Verbal support given. Pt encouraged to attend groups. Pt encouraged to report to the nurses station when self harm thoughts increase. 15 minute checks performed for safety.  R: Pt receptive to treatment.

## 2015-06-11 NOTE — [facility] Group Notes (Signed)
[facility] LCSW Aftercare Discharge Planning Group Note   06/11/2015 9:34 AM    Participation Quality:  Appropraite  Mood/Affect:  Appropriate  Depression Rating:  6  Anxiety Rating:  8  Thoughts of Suicide:  Yes  Will you contract for safety? Yes  Current AVH:  No  Plan for Discharge/Comments:  Patient attended discharge planning group and actively participated in group. She reports she was not being seen outpatient prior to admission and is requesting to be referred to [facility].  Suicide prevention education reviewed and SPE document provided.   Transportation Means: Patient has transportation.   Supports:  Patient has a support system.   Roanne Haye, Joesph JulyQuylle Hairston

## 2015-06-11 NOTE — Progress Notes (Signed)
Pt woke up c/o chest pain. Pt reports she feel anxious, has racing thoughts. Vital signs: B/P 118/81, P 75, O2 100%. Pt refuse medication and went back to bed. Will continue to monitor.

## 2015-06-11 NOTE — Progress Notes (Signed)
[facility] MD Progress Note  06/11/2015 12:53 PM LYRIK DOCKSTADER  MRN:  [id number] Subjective:   Patient reports some improvement but continues to feel depressed, sad, and at times has urges to cut self, although denies any cutting on the unit or any intention of cutting self at this time. Denies medication  Side effects at this time. Objective : I have discussed case with treatment team and have met with patient. She remains depressed, sad, but today affect somewhat more reactive . She continues to ruminate about stressors such as her mother's Parkinson's Disorder and her father's recent death. States that she had a poor relationship with father, but feels that she never got to have a sense of closure once he passed away. She is going to groups and is visible in milieu. She denies medication side effects. She ambulates slowly due to being quadriparetic, but denies falls and states she is able to manage largely independently at home . She is responsive to support , encouragement and review of coping skills. Affect tended to improve during session.  Patient insightful about her history of cutting- states she normally cuts not to attempt suicide but to " get some release from the feelings ", and also because sometimes " it's like I need to atone, for example , if I get angry with my mother and then feel guilty about it because I know she has Parkinson's and it's not her fault " She states she is trying to develop better coping skills to address negative emotions and as noted, denies any current plans or intention of cutting on unit .  Sleep is fair- does not like Trazodone . Has been on Klonopin for years, and states has no side effects from it- states she takes 2 mgrs QHS without side effects.  Of note, UA is remarkable for ketones ( patient states she has not been eating much recently) and for bacteria- no nitirites , no WBC. Patient has no symptoms of UTI or fever.  TSH slightly elevated . Principal  Problem: MDD (major depressive disorder) Diagnosis:   Patient Active Problem List   Diagnosis Date Noted  . MDD (major depressive disorder) [F32.2] 06/09/2015   Total Time spent with patient: 25 minutes    Past Medical History:  Past Medical History  Diagnosis Date  . C6 spinal cord injury   . Depression     Past Surgical History  Procedure Laterality Date  . Back surgery     Family History: History reviewed. No pertinent family history. Social History:  History  Alcohol Use No     History  Drug Use No    History   Social History  . Marital Status: Single    Spouse Name: N/A  . Number of Children: N/A  . Years of Education: N/A   Social History Main Topics  . Smoking status: Never Smoker   . Smokeless tobacco: Not on file  . Alcohol Use: No  . Drug Use: No  . Sexual Activity: Not on file   Other Topics Concern  . None   Social History Narrative   Additional History:    Sleep: Fair  Appetite:  Good   Assessment:   Musculoskeletal: Strength & Muscle Tone: [place]: slow, walks with cane Patient leans: N/A   Psychiatric Specialty Exam: Physical Exam  ROS denies fever, chills, denies dysuria, denies urgency  Blood pressure 140/67, pulse 91, temperature 98.5 F (36.9 C), temperature source Oral, resp. rate 16, height _0  (  1.575 m), weight 135 lb (61.236 kg), last menstrual period 06/02/2015, SpO2 98 %.Body mass index is 24.69 kg/(m^2).  General Appearance: improved grooming  Eye Contact::  Good  Speech:  Normal Rate  Volume:  Normal  Mood:  depressed  Affect:  constricted, but more reactive today, smiles at times appropriately  Thought Process:  Goal Directed and Linear  Orientation:  Full (Time, Place, and Person)  Thought Content:  denies hallucinations, no delusions   Suicidal Thoughts:  No- reports urges or thoughts of cutting self as a way of addressing anxiety, depression, but denies any plan or intention of hurting  self . At this time denies suicidal ideations  Homicidal Thoughts:  No  Memory:  recent and remote grossly intact   Judgement:  Other:  present  Insight:  Present  Psychomotor Activity:  walks slowly  Concentration:  Good  Recall:  Good  Fund of Knowledge:Good  Language: Good  Akathisia:  Negative  Handed:  Right  AIMS (if indicated):     Assets:  Communication Skills Desire for Improvement Resilience  ADL's:  Improving   Cognition: WNL  Sleep:        Current Medications: Current Facility-Administered Medications  Medication Dose Route Frequency Provider Last Rate Last Dose  . acetaminophen (TYLENOL) tablet 650 mg  650 mg Oral Q6H PRN Nicholaus Bloom, MD      . alum & mag hydroxide-simeth (MAALOX/MYLANTA) 200-200-20 MG/5ML suspension 30 mL  30 mL Oral Q4H PRN Nicholaus Bloom, MD      . buPROPion (WELLBUTRIN XL) 24 hr tablet 150 mg  150 mg Oral Daily [name], MD   150 mg at 06/11/15 0751  . [START ON 06/12/2015] clonazePAM (KLONOPIN) tablet 2 mg  2 mg Oral QHS Parminder Trapani A Grant Henkes, MD      . magnesium hydroxide (MILK OF MAGNESIA) suspension 30 mL  30 mL Oral Daily PRN Nicholaus Bloom, MD      . SUMAtriptan (IMITREX) tablet 25 mg  25 mg Oral Q12H PRN [name], MD   25 mg at 06/11/15 1013    Lab Results:  Results for orders placed or performed during the hospital encounter of 06/09/15 (from the past 48 hour(s))  Urinalysis with microscopic     Status: Abnormal   Collection Time: 06/10/15  7:46 PM  Result Value Ref Range   Color, Urine YELLOW YELLOW   APPearance CLOUDY (A) CLEAR   Specific Gravity, Urine 1.016 1.005 - 1.030   pH 6.0 5.0 - 8.0   Glucose, UA NEGATIVE NEGATIVE mg/dL   Hgb urine dipstick NEGATIVE NEGATIVE   Bilirubin Urine NEGATIVE NEGATIVE   Ketones, ur >80 (A) NEGATIVE mg/dL   Protein, ur NEGATIVE NEGATIVE mg/dL   Urobilinogen, UA 0.2 0.0 - 1.0 mg/dL   Nitrite NEGATIVE NEGATIVE   Leukocytes, UA NEGATIVE NEGATIVE   Bacteria, UA MANY (A) RARE    Squamous Epithelial / LPF MANY (A) RARE    Comment: Performed at [facility]  CBC with Differential/Platelet     Status: None   Collection Time: 06/11/15  6:16 AM  Result Value Ref Range   WBC 9.0 4.0 - 10.5 K/uL    Comment: WHITE COUNT CONFIRMED ON SMEAR   RBC 5.01 3.87 - 5.11 MIL/uL   Hemoglobin 14.7 12.0 - 15.0 g/dL   HCT 43.1 36.0 - 46.0 %   MCV 86.0 78.0 - 100.0 fL   MCH 29.3 26.0 - 34.0 pg   MCHC 34.1  30.0 - 36.0 g/dL   RDW 13.3 11.5 - 15.5 %   Platelets PLATELET CLUMPS NOTED ON SMEAR, UNABLE TO ESTIMATE 150 - 400 K/uL   Neutrophils Relative % 70 43 - 77 %   Lymphocytes Relative 23 12 - 46 %   Monocytes Relative 7 3 - 12 %   Eosinophils Relative 0 0 - 5 %   Basophils Relative 0 0 - 1 %   Neutro Abs 6.3 1.7 - 7.7 K/uL   Lymphs Abs 2.1 0.7 - 4.0 K/uL   Monocytes Absolute 0.6 0.1 - 1.0 K/uL   Eosinophils Absolute 0.0 0.0 - 0.7 K/uL   Basophils Absolute 0.0 0.0 - 0.1 K/uL   Smear Review PLATELET CLUMPS NOTED ON SMEAR     Comment: Performed at [place] metabolic panel     Status: Abnormal   Collection Time: 06/11/15  6:16 AM  Result Value Ref Range   Sodium 135 135 - 145 mmol/L   Potassium 4.0 3.5 - 5.1 mmol/L   Chloride 105 101 - 111 mmol/L   CO2 18 (L) 22 - 32 mmol/L   Glucose, Bld 77 65 - 99 mg/dL   BUN 11 6 - 20 mg/dL   Creatinine, Ser 0.62 0.44 - 1.00 mg/dL   Calcium 8.8 (L) 8.9 - 10.3 mg/dL   GFR calc non Af Amer >60 >60 mL/min   GFR calc Af Amer >60 >60 mL/min    Comment: (NOTE) The eGFR has been calculated using the CKD EPI equation. This calculation has not been validated in all clinical situations. eGFR's persistently <60 mL/min signify possible Chronic Kidney Disease.    Anion gap 12 5 - 15    Comment: Performed at [facility]  TSH     Status: Abnormal   Collection Time: 06/11/15  6:16 AM  Result Value Ref Range   TSH 4.924 (H) 0.350 - 4.500 uIU/mL    Comment: Performed at [facility]    Physical Findings: AIMS: Facial and Oral Movements Muscles of Facial Expression: None, normal Lips and Perioral Area: None, normal Jaw: None, normal Tongue: None, normal,Extremity Movements Upper (arms, wrists, hands, fingers): None, normal Lower (legs, knees, ankles, toes): None, normal, Trunk Movements Neck, shoulders, hips: None, normal, Overall Severity Severity of abnormal movements (highest score from questions above): None, normal Incapacitation due to abnormal movements: None, normal Patient's awareness of abnormal movements (rate only patient's report): No Awareness, Dental Status Current problems with teeth and/or dentures?: No Does patient usually wear dentures?: No  CIWA:    COWS:      Assessment - still depressed, anxious, ruminative about stressors, and endorsing thoughts of cutting, which she has a history of engaging in to address negative affects. She states she has refrained from cutting and does not have plan or intention to do so. Responds to support, and affect improves during session. Sleep is fair. Reports at home takes Klonopin 2 mgrs QHS without side effects and with much improvement of insomnia.  Tolerating medications well thus far. TSH slightly increased - UA has bacteria but no WBC and patient is asypmtomatic . At this time tolerating  Wellbutrin trial well. Has been on Wellbutrin XL in the past with no side effects and good response .   Treatment Plan Summary: Daily contact with patient to assess and evaluate symptoms and progress in treatment, Medication management, Plan continue inpatient treatment and continue medications as below  Wellbutrin XL 150 mgrs QAM to  address depression Change Klonopin to 2 mgrs QHS to address insomnia D/C Trazodone , as patient states not working well and causing her to feel " groggy". Follow up on slightly elevated TSH - will order FT3, FT4.   Medical Decision Making:  Established Problem,  Stable/Improving (1), Review of Psycho-Social Stressors (1), Review or order clinical lab tests (1) and Review of Medication Regimen & Side Effects (2)     Jonie Burdell 06/11/2015, 12:53 PM

## 2015-06-11 NOTE — [facility] Group Notes (Signed)
[facility] LCSW Group Therapy  Emotional Regulation 1:15 - 2: 30 PM        06/11/2015     Type of Therapy:  Group Therapy  Participation Level:  Appropriate  Participation Quality:  Appropriate  Affect:  Depressed  Cognitive:  Appropriate  Insight:  Developing/Improving Engaged  Engagement in Therapy:  Developing/Improving Engaged  Modes of Intervention:  Discussion Exploration Problem-Solving Supportive  Summary of Progress/Problems:  Group topic was emotional regulations.  Patient participated in the discussion and was able to identify anger as the emotion she needs to regulate.  She talked about the unfairness of her mother's illness and how she does not know how she will be able to take care of her as the illness progresses.  Wynn BankerHodnett, Chaka Boyson Hairston 06/11/2015

## 2015-06-12 LAB — T4, FREE: Free T4: 0.92 ng/dL (ref 0.61–1.12)

## 2015-06-12 NOTE — [facility] Group Notes (Signed)
Adult Psychoeducational Group Note  Date:  06/12/2015 Time:  0900am  Group Topic/Focus:  Goals Group:   The focus of this group is to help patients establish daily goals to achieve during treatment and discuss how the patient can incorporate goal setting into their daily lives to aide in recovery. Orientation:   The focus of this group is to educate the patient on the purpose and policies of crisis stabilization and provide a format to answer questions about their admission.  The group details unit policies and expectations of patients while admitted.  Participation Level:  Active  Participation Quality:  Appropriate and Attentive  Affect:  Appropriate  Cognitive:  Alert and Appropriate  Insight: Appropriate and Good  Engagement in Group:  Engaged  Modes of Intervention:  Discussion, Education, Orientation and Support  Natasha [name], Natasha [name] 06/12/2015, 9:57 AM

## 2015-06-12 NOTE — Progress Notes (Signed)
Patient ID: Natasha [name], female   DOB: 05/22/1971, 44 y.o.   MRN: [id number] [facility] MD Progress Note  06/12/2015 2:42 PM Natasha [name]  MRN:  [id number] Subjective:   Patient  Reports ongoing depression although she denies any suicidal ideations currently. She states she still feels a sense of being overwhelmed by her psychosocial stressors, primarily  Her mother's medical illnesses/physical decline ( mother has Parkinson's Disease- patient lives with mother ). She states that Wellbutrin trial seems to be helping, and she does describe some improvement, although as noted, it is partial at this time. She complains of some anorexia/loss of appetite, which may be related to Wellbutrin trial. Objective : I have discussed case with treatment team and have met with patient. As noted, remains depressed, but some degree of improvement- not currently actively suicidal but still ruminative about stressors . She describes ongoing feelings of being unprepared, overwhelmed to deal with her mother's medical illness/decline. No disruptive behaviors on the unit- has been going to groups and is visible in milieu. History of self cutting to address  Anxiety, depression- denies any cutting on unit, and at this time denies self injurious plans/ intentions.  Patient has a history of quadriparesia, related to serious suicide attempt by jumping, which resulted in spinal cord damage. She is able to mobilize independently , but slowly. She spoke today about traumatic experiences following her suicide attempt ( was completely quadriplegic for months, and spent almost 2 years in a NH)- she denies, however, PTSD type symptoms at present. Responds to support, encouragement, empathy.  FT4 WNL  Principal Problem: MDD (major depressive disorder) Diagnosis:   Patient Active Problem List   Diagnosis Date Noted  . MDD (major depressive disorder) [F32.2] 06/09/2015   Total Time spent with patient: 25 minutes    Past Medical  History:  Past Medical History  Diagnosis Date  . C6 spinal cord injury   . Depression     Past Surgical History  Procedure Laterality Date  . Back surgery     Family History: History reviewed. No pertinent family history. Social History:  History  Alcohol Use No     History  Drug Use No    History   Social History  . Marital Status: Single    Spouse Name: N/A  . Number of Children: N/A  . Years of Education: N/A   Social History Main Topics  . Smoking status: Never Smoker   . Smokeless tobacco: Not on file  . Alcohol Use: No  . Drug Use: No  . Sexual Activity: Not on file   Other Topics Concern  . None   Social History Narrative   Additional History:    Sleep: improved   Appetite:  Poor- describes anorexia    Assessment:   Musculoskeletal: Strength & Muscle Tone: quariparesia Gait & Station: slow, walks with cane Patient leans: N/A   Psychiatric Specialty Exam: Physical Exam  ROS denies fever, chills, denies dysuria, denies urgency  Blood pressure 105/90, pulse 118, temperature 98.7 F (37.1 C), temperature source Oral, resp. rate 16, height 5' 2" (1.575 m), weight 135 lb (61.236 kg), last menstrual period 06/02/2015, SpO2 98 %.Body mass index is 24.69 kg/(m^2).  General Appearance: improved grooming  Eye Contact::  Good  Speech:  Normal Rate  Volume:  Normal  Mood:  depressed, but somewhat improved compared to admission  Affect:  Constricted and but reactive  Thought Process:  Goal Directed and Linear  Orientation:  Full (Time, Place, and   Person)  Thought Content:  denies hallucinations, no delusions   Suicidal Thoughts:  No- currently denies plan or intentions of hurting self /denies thoughts of self cutting today.   Homicidal Thoughts:  No  Memory:  recent and remote grossly intact   Judgement:  Other:  present  Insight:  Present  Psychomotor Activity:  walks slowly  Concentration:  Good  Recall:  Good  Fund of Knowledge:Good  Language:  Good  Akathisia:  Negative  Handed:  Right  AIMS (if indicated):     Assets:  Communication Skills Desire for Improvement Resilience  ADL's:  Improving   Cognition: WNL  Sleep:        Current Medications: Current Facility-Administered Medications  Medication Dose Route Frequency Provider Last Rate Last Dose  . acetaminophen (TYLENOL) tablet 650 mg  650 mg Oral Q6H PRN Irving A Lugo, MD      . alum & mag hydroxide-simeth (MAALOX/MYLANTA) 200-200-20 MG/5ML suspension 30 mL  30 mL Oral Q4H PRN Irving A Lugo, MD      . buPROPion (WELLBUTRIN XL) 24 hr tablet 150 mg  150 mg Oral Daily Fernando A Cobos, MD   150 mg at 06/12/15 0824  . clonazePAM (KLONOPIN) tablet 2 mg  2 mg Oral QHS Fernando A Cobos, MD   2 mg at 06/11/15 2056  . magnesium hydroxide (MILK OF MAGNESIA) suspension 30 mL  30 mL Oral Daily PRN Irving A Lugo, MD      . SUMAtriptan (IMITREX) tablet 25 mg  25 mg Oral Q12H PRN Fernando A Cobos, MD   25 mg at 06/11/15 1013    Lab Results:  Results for orders placed or performed during the hospital encounter of 06/09/15 (from the past 48 hour(s))  Urinalysis with microscopic     Status: Abnormal   Collection Time: 06/10/15  7:46 PM  Result Value Ref Range   Color, Urine YELLOW YELLOW   APPearance CLOUDY (A) CLEAR   Specific Gravity, Urine 1.016 1.005 - 1.030   pH 6.0 5.0 - 8.0   Glucose, UA NEGATIVE NEGATIVE mg/dL   Hgb urine dipstick NEGATIVE NEGATIVE   Bilirubin Urine NEGATIVE NEGATIVE   Ketones, ur >80 (A) NEGATIVE mg/dL   Protein, ur NEGATIVE NEGATIVE mg/dL   Urobilinogen, UA 0.2 0.0 - 1.0 mg/dL   Nitrite NEGATIVE NEGATIVE   Leukocytes, UA NEGATIVE NEGATIVE   Bacteria, UA MANY (A) RARE   Squamous Epithelial / LPF MANY (A) RARE    Comment: Performed at [facility]  CBC with Differential/Platelet     Status: None   Collection Time: 06/11/15  6:16 AM  Result Value Ref Range   WBC 9.0 4.0 - 10.5 K/uL    Comment: WHITE COUNT CONFIRMED ON SMEAR   RBC  5.01 3.87 - 5.11 MIL/uL   Hemoglobin 14.7 12.0 - 15.0 g/dL   HCT 43.1 36.0 - 46.0 %   MCV 86.0 78.0 - 100.0 fL   MCH 29.3 26.0 - 34.0 pg   MCHC 34.1 30.0 - 36.0 g/dL   RDW 13.3 11.5 - 15.5 %   Platelets PLATELET CLUMPS NOTED ON SMEAR, UNABLE TO ESTIMATE 150 - 400 K/uL   Neutrophils Relative % 70 43 - 77 %   Lymphocytes Relative 23 12 - 46 %   Monocytes Relative 7 3 - 12 %   Eosinophils Relative 0 0 - 5 %   Basophils Relative 0 0 - 1 %   Neutro Abs 6.3 1.7 - 7.7 K/uL     Lymphs Abs 2.1 0.7 - 4.0 K/uL   Monocytes Absolute 0.6 0.1 - 1.0 K/uL   Eosinophils Absolute 0.0 0.0 - 0.7 K/uL   Basophils Absolute 0.0 0.0 - 0.1 K/uL   Smear Review PLATELET CLUMPS NOTED ON SMEAR     Comment: Performed at [facility]  Basic metabolic panel     Status: Abnormal   Collection Time: 06/11/15  6:16 AM  Result Value Ref Range   Sodium 135 135 - 145 mmol/L   Potassium 4.0 3.5 - 5.1 mmol/L   Chloride 105 101 - 111 mmol/L   CO2 18 (L) 22 - 32 mmol/L   Glucose, Bld 77 65 - 99 mg/dL   BUN 11 6 - 20 mg/dL   Creatinine, Ser 0.62 0.44 - 1.00 mg/dL   Calcium 8.8 (L) 8.9 - 10.3 mg/dL   GFR calc non Af Amer >60 >60 mL/min   GFR calc Af Amer >60 >60 mL/min    Comment: (NOTE) The eGFR has been calculated using the CKD EPI equation. This calculation has not been validated in all clinical situations. eGFR's persistently <60 mL/min signify possible Chronic Kidney Disease.    Anion gap 12 5 - 15    Comment: Performed at [facility]  TSH     Status: Abnormal   Collection Time: 06/11/15  6:16 AM  Result Value Ref Range   TSH 4.924 (H) 0.350 - 4.500 uIU/mL    Comment: Performed at [facility]  T4, free     Status: None   Collection Time: 06/12/15  6:35 AM  Result Value Ref Range   Free T4 0.92 0.61 - 1.12 ng/dL    Comment: Performed at [facility]    Physical Findings: AIMS: Facial and Oral Movements Muscles of Facial Expression: None,  normal Lips and Perioral Area: None, normal Jaw: None, normal Tongue: None, normal,Extremity Movements Upper (arms, wrists, hands, fingers): None, normal Lower (legs, knees, ankles, toes): None, normal, Trunk Movements Neck, shoulders, hips: None, normal, Overall Severity Severity of abnormal movements (highest score from questions above): None, normal Incapacitation due to abnormal movements: None, normal Patient's awareness of abnormal movements (rate only patient's report): No Awareness, Dental Status Current problems with teeth and/or dentures?: No Does patient usually wear dentures?: No  CIWA:    COWS:      Assessment -  Patient is depressed , sad, ruminative, but better than upon admission. Today does not describe any further urges to cut self , and is able to contract for safety on the unit. She is sleeping better, and thus far has tolerated Klonopin well ( has been on this medication for years, without side effects- denies misuse or abuse ). Wellbutrin possibly contributing to anorexia- we discussed and as she feels medication is helping , and is optimistic about responding to it, we decided to continue current medication trial at this time. TSH slightly elevated, but FT4 WNL.   Treatment Plan Summary: Daily contact with patient to assess and evaluate symptoms and progress in treatment, Medication management, Plan continue inpatient treatment and continue medications as below  Wellbutrin XL 150 mgrs QAM to address depression Continue  Klonopin  2 mgrs QHS to address insomnia Monitor appetite , PO intake . Imitrex PRNs for Migraine if needed   Medical Decision Making:  Established Problem, Stable/Improving (1), Review of Psycho-Social Stressors (1), Review or order clinical lab tests (1) and Review of Medication Regimen & Side Effects (2)       COBOS, Piatt 06/12/2015, 2:42 PM

## 2015-06-12 NOTE — Progress Notes (Signed)
Patient ID: Natasha SartoriusJenifer K [name], female   DOB: 02-03-1971, 44 y.o.   MRN: [id number]   D: Patient pleasant on approach today. Reports that she slept well last night but says she woke up one time and thought roommate was somebody else but then she realized she was in the hospital. Did not attend Karaoke tonight but sat in hallway and spoke with a peer during that time. Depression continues with some passive thought to harm self today. Using wheelchair and crutch to get around unit. A: Staff will monitor on q 15 minute checks, follow treatment plan, and give medications as ordered. R: Cooperative on the unit.

## 2015-06-12 NOTE — [facility] Group Notes (Signed)
[facility] LCSW Group Therapy  [facility] 1:15 - 2:30 PM  06/12/2015 3:09 PM   Type of Therapy:  Group Therapy  Participation Level:  Minimal  Participation Quality:  Attentive  Affect:  Appropriate  Cognitive:  Appropriate  Insight:  Developing/Improving   Engagement in Therapy:  Developing/Improving   Modes of Intervention:  Discussion, Education, Exploration, Problem-Solving, Rapport Building, Support   Summary of Progress/Problems:   Patient was attentive to speaker from the [employer] as he shared his story of dealing with mental health/substance abuse issues and overcoming it by working a recovery program. Patient shared she really enjoyed the speaker and asked questions about the presentation.  Patient received information on their agency.    Wynn BankerHodnett, Enrico Eaddy Hairston 06/12/2015 3:10 PM

## 2015-06-12 NOTE — Progress Notes (Signed)
D:  Per pt self inventory pt reports sleeping good, appetite poor, energy level low, ability to pay attention good, rates depression at a 6 out of 10, hopelessness at a 5 out of 10, anxiety at a 4 out of 10, denies HI/AVH,  Endorses SI on and off, no plan, contracts for safety, goal today:  "try to eat and participate"     A:  Emotional support provided, Encouraged pt to continue with treatment plan and attend all group activities, q15 min checks maintained for safety.  R:  Pt is receptive, going to groups, pleasant with staff and other patients on the unit.

## 2015-06-12 NOTE — Progress Notes (Signed)
Pt did not attend karaoke group tonight.

## 2015-06-12 NOTE — [facility] Suicide Risk Assessment (Signed)
[facility] INPATIENT:  Family/Significant Other Suicide Prevention Education  Suicide Prevention Education:  Education Completed; Natasha [name], Mother, [phone];  has been identified by the patient as the family member/significant other with whom the patient will be residing, and identified as the person(s) who will aid the patient in the event of a mental health crisis (suicidal ideations/suicide attempt).  With written consent from the patient, the family member/significant other has been provided the following suicide prevention education, prior to the and/or following the discharge of the patient.  Mother advised of having lived with patient and knows what to do in a crisis.  The suicide prevention education provided includes the following:  Suicide risk factors  Suicide prevention and interventions  National Suicide Hotline telephone number  [facility] assessment telephone number  [facility] Emergency Assistance 911  [facility] and/or [facility] telephone number  Request made of family/significant other to:  Remove weapons (e.g., guns, rifles, knives), all items previously/currently identified as safety concern.  .Mother advised patient does not have access to weapons.      Remove drugs/medications (over-the-counter, prescriptions, illicit drugs), all items previously/currently identified as a safety concern.  The family member/significant other verbalizes understanding of the suicide prevention education information provided.  The family member/significant other agrees to remove the items of safety concern listed above.  Natasha [name] 06/12/2015, 3:10 PM

## 2015-06-13 LAB — T3, FREE: T3, Free: 1.9 pg/mL — ABNORMAL LOW (ref 2.0–4.4)

## 2015-06-13 MED ORDER — BUPROPION HCL ER (XL) 300 MG PO TB24
300.0000 mg | ORAL_TABLET | Freq: Every day | ORAL | Status: DC
  Administered 2015-06-14 – 2015-06-19 (×6): 300 mg via ORAL
  Filled 2015-06-13 (×7): qty 1

## 2015-06-13 MED ORDER — MIRTAZAPINE 7.5 MG PO TABS
7.5000 mg | ORAL_TABLET | Freq: Every day | ORAL | Status: DC
  Administered 2015-06-13 – 2015-06-16 (×4): 7.5 mg via ORAL
  Filled 2015-06-13 (×6): qty 1

## 2015-06-13 NOTE — Progress Notes (Signed)
Pt slept during group.

## 2015-06-13 NOTE — Progress Notes (Signed)
D.  Pt pleasant on approach, denies complaints at this time.  Interacting appropriately with peers on the unit.  Positive for evening wrap up group.  Requested night time medications after wrap up group so that she could go to bed early.  Denies HI/hallucinatons at this time.  Passive SI although earlier she was found to be making superficial scratches with her eye glasses on her wrist.  Pt does contract for safety on the unit at this time.  A.  Support and encouragement offered  R.  Pt remains safe on the unit, will continue to monitor.

## 2015-06-13 NOTE — Progress Notes (Addendum)
Patient ID: Natasha [name], female   DOB: 02-05-1971, 44 y.o.   MRN: [id number] [facility] MD Progress Note  06/13/2015 2:48 PM Natasha [name]  MRN:  [id number] Subjective:   Patient states she continues to struggle with depression, sadness and chronic - currently passive - thoughts of death.  Yesterday she impulsively rubbed her eyeglasses on her wrist , causing some inflammation, redness, but no skin break. She denies any suicidal intent in this, but rather an attempt to seek relief or release from her depression at the time. She denies medication side effects.  Objective : I have discussed case with treatment team and have met with patient. Patient visible on unit, going to most groups- no disruptive behaviors on unit- as noted , did have an episode of scratching wrist yesterday. Denies any current suicidal plan or intention, but states she has chronic, recurrent suicidal ideations, and worries that her mother's physical decline from Parkinson's Disorder, might cause suicidal ideations to worsen after she returns home.  Responds fairly well to support, encouragement, empathy, and review of coping skills. She reports decreased appetite since she started Wellbutrin XL trial, but  Does not want to stop medication as she does feel it is helping .  We discussed other options and agrees to Remeron trial , to help address depression, and also help with appetite and sleep. Patient mobilizes slowly but steadily using cane, denies falls. Insomnia has improved partially but significantly on QHS Klonopin - she denies side effects. Denies excessive sedation.   Principal Problem: MDD (major depressive disorder) Diagnosis:   Patient Active Problem List   Diagnosis Date Noted  . MDD (major depressive disorder) [F32.2] 06/09/2015   Total Time spent with patient: 25 minutes    Past Medical History:  Past Medical History  Diagnosis Date  . C6 spinal cord injury   . Depression     Past Surgical History   Procedure Laterality Date  . Back surgery     Family History: History reviewed. No pertinent family history. Social History:  History  Alcohol Use No     History  Drug Use No    History   Social History  . Marital Status: Single    Spouse Name: N/A  . Number of Children: N/A  . Years of Education: N/A   Social History Main Topics  . Smoking status: Never Smoker   . Smokeless tobacco: Not on file  . Alcohol Use: No  . Drug Use: No  . Sexual Activity: Not on file   Other Topics Concern  . None   Social History Narrative   Additional History:    Sleep:  Fair, but improving   Appetite:  Poor- describes anorexia    Assessment:   Musculoskeletal: Strength & Muscle Tone: [place]: slow, walks with cane Patient leans: N/A   Psychiatric Specialty Exam: Physical Exam  ROS denies fever, chills, denies dysuria, denies urgency  Blood pressure 119/80, pulse 93, temperature 98.5 F (36.9 C), temperature source Oral, resp. rate 18, height $RemoveBe'5\' 2"'IEYpQSyyW$  (1.575 m), weight 135 lb (61.236 kg), last menstrual period 06/02/2015, SpO2 98 %.Body mass index is 24.69 kg/(m^2).  General Appearance: improved grooming  Eye Contact::  Good  Speech:  Normal Rate  Volume:  Normal  Mood:  Reports ongoing depression, but feels medication " may be helping "   Affect:   More reactive today, smiles at times appropriately   Thought Process:  Goal Directed and Linear  Orientation:  Full (Time,  Place, and Person)  Thought Content:  denies hallucinations, no delusions   Suicidal Thoughts:  Describes chronic history of intermittent suicidal ideations- at this time denies any current thoughts, plans or intentions of suicide.   Homicidal Thoughts:  No  Memory:  recent and remote grossly intact   Judgement:  Other:  present  Insight:  Present  Psychomotor Activity:  walks slowly  Concentration:  Good  Recall:  Good  Fund of Knowledge:Good  Language: Good  Akathisia:  Negative   Handed:  Right  AIMS (if indicated):     Assets:  Communication Skills Desire for Improvement Resilience  ADL's:  Improving   Cognition: WNL  Sleep:  Number of Hours: 4.5     Current Medications: Current Facility-Administered Medications  Medication Dose Route Frequency Provider Last Rate Last Dose  . acetaminophen (TYLENOL) tablet 650 mg  650 mg Oral Q6H PRN Nicholaus Bloom, MD      . alum & mag hydroxide-simeth (MAALOX/MYLANTA) 200-200-20 MG/5ML suspension 30 mL  30 mL Oral Q4H PRN Nicholaus Bloom, MD      . buPROPion (WELLBUTRIN XL) 24 hr tablet 150 mg  150 mg Oral Daily [name], MD   150 mg at 06/13/15 0816  . clonazePAM (KLONOPIN) tablet 2 mg  2 mg Oral QHS [name], MD   2 mg at 06/12/15 2305  . magnesium hydroxide (MILK OF MAGNESIA) suspension 30 mL  30 mL Oral Daily PRN Nicholaus Bloom, MD      . SUMAtriptan [facility]) tablet 25 mg  25 mg Oral Q12H PRN [name], MD   25 mg at 06/13/15 0818    Lab Results:  Results for orders placed or performed during the hospital encounter of 06/09/15 (from the past 48 hour(s))  T3, free     Status: Abnormal   Collection Time: 06/12/15  6:35 AM  Result Value Ref Range   T3, Free 1.9 (L) 2.0 - 4.4 pg/mL    Comment: (NOTE) Performed At: [facility] [place], [place] [id number] Lindon Romp MD [id number] Performed at [facility]   T4, free     Status: None   Collection Time: 06/12/15  6:35 AM  Result Value Ref Range   Free T4 0.92 0.61 - 1.12 ng/dL    Comment: Performed at [facility]    Physical Findings: AIMS: Facial and Oral Movements Muscles of Facial Expression: None, normal Lips and Perioral Area: None, normal Jaw: None, normal Tongue: None, normal,Extremity Movements Upper (arms, wrists, hands, fingers): None, normal Lower (legs, knees, ankles, toes): None, normal, Trunk Movements Neck, shoulders, hips: None, normal, Overall Severity Severity of  abnormal movements (highest score from questions above): None, normal Incapacitation due to abnormal movements: None, normal Patient's awareness of abnormal movements (rate only patient's report): No Awareness, Dental Status Current problems with teeth and/or dentures?: No Does patient usually wear dentures?: No  CIWA:    COWS:      Assessment - patient is presenting with partially improved mood /affect, but continues to report chronic depression, and ruminates about how her mother's chronic illness may affect her mood/depression after she returns home . Yesterday had episode of scratching wrist with her eyewear-  To alleviate stress/ negative emotions. She has some erythema but no skin break /bleeding. Has history of self cutting in the past. At this time denies any ongoing thoughts of hurting self on unit and contracts for safety. Continues to report some  anorexia related to Wellbturin XL but wants to continue trial.  Does agree to Remeron  Trial as augmentation, which may also help appetite . TSH slightly elevated, T3/T4 unremarkable- no clear symptoms of hypothyroidism reported - discussed this with patient , recommended repeat TSH in 5-6 weeks to monitor.    Treatment Plan Summary: Daily contact with patient to assess and evaluate symptoms and progress in treatment, Medication management, Plan continue inpatient treatment and continue medications as below  Increase Wellbutrin XL  To 300  mgrs QAM to address depression Continue  Klonopin  2 mgrs QHS to address insomnia/night time anxiety Start Remeron 7.5 mgrs QHS for depression Imitrex PRNs for Migraine if needed   Medical Decision Making:  Established Problem, Stable/Improving (1), Review of Psycho-Social Stressors (1), Review or order clinical lab tests (1) and Review of Medication Regimen & Side Effects (2)     COBOS, FERNANDO 06/13/2015, 2:48 PM

## 2015-06-13 NOTE — Plan of Care (Signed)
Problem: Consults Goal: Suicide Risk Patient Education (See Patient Education module for education specifics)  Outcome: Progressing Nurse discussed SI thoughts/coping skills with pt.

## 2015-06-13 NOTE — Progress Notes (Addendum)
D:  Patient's self inventory sheet, patient slept good last night, sleep medication was helpful.  Poor appetite, low energy level, good concentration.  Rated depression, hopeless 10, anxiety 5.  Denied withdrawals.  SI, contracts for safety.  Denied physical problems.  Denied pain.  Goal is to talk to MD.   A:  Medications administered per MD orders.  Emotional support and encouragement given patient. R:  Denied HI.  Denied A/V hallucinations.  SI, contracts for safety.  Safety maintained with 15 minute  Checks.  Patient had taken her eye glasses this morning and rubbed on her lower left arm which was reddish, no broken skin.  Patient asked nurse to take her eyeglasses which were put in med drawer.  Patient informed, NP and MD also informed.  Later patient stated she was feeling better.

## 2015-06-13 NOTE — [facility] Group Notes (Signed)
[facility]   06/13/2015 10:17 AM    Participation Quality:  Appropraite  Mood/Affect:  Appropriate  Depression Rating:  8  Anxiety Rating:  2  Thoughts of Suicide:  Yes  Will you contract for safety?  Yes  Current AVH:  No  Plan for Discharge/Comments:  Patient attended discharge planning group and actively participated in group. She reported in group of having difficulty talking.  CSW had NP to come into group to check on patient.  Suicide prevention education reviewed and SPE document provided.   Transportation Means: Patient has transportation.   Supports:  Patient has a support system.   Natasha [name], Joesph JulyQuylle Hairston

## 2015-06-13 NOTE — Progress Notes (Signed)
Patient ID: Natasha [name], femalMelven Sartoriuse   DOB: 02-23-1971, 44 y.o.   MRN: [id number] PER STATE REGULATIONS 482.30  THIS CHART WAS REVIEWED FOR MEDICAL NECESSITY WITH RESPECT TO THE PATIENT'S ADMISSION/ DURATION OF STAY.  NEXT REVIEW DATE: 06/17/2015   Willa RoughJENNIFER JONES Raffi Milstein, RN, BSN CASE MANAGER

## 2015-06-13 NOTE — Tx Team (Addendum)
Interdisciplinary Treatment Plan Update (Adult)  Date:  06/13/2015  Time Reviewed:  9:36 AM   Progress in Treatment: Attending groups: Patient is attending groups. Participating in groups:  Patient engages in discussion Taking medication as prescribed:  Patient is taking medications Tolerating medication:  Patient is tolerating medications Family/Significant othe contact made:   Yes,  collateral contact with mother Patient understands diagnosis:Yes, patient understands diagnosis and need for treatment Discussing patient identified problems/goals with staff:  Yes, patient is able to express goals/problems Medical problems stabilized or resolved:  Yes Denies suicidal/homicidal ideation: No.  Patient is endorsing SI but able to contract for safety. Issues/concerns per patient self-inventory:   Other:  Discharge Plan or Barriers:  Follow up to be scheduled by mother with [facility]  Reason for Continuation of Hospitalization: Anxiety Depression Medication stabilization Suicidal ideation  Comments:  Continue medication stabilization  Additional comments:  Patient and CSW reviewed Patient Discharge Process Letter/Patient Involvement Form.  Patient verbalized understanding and signed form.  Patient and CSW also reviewed and identified patient's goals and treatment plan.  Patient verbalized understanding and agreed to plan.  Estimated length of stay: 3-4 days  New goal(s):  Review of initial/current patient goals per problem list:  Please see plan of careInterdisciplinary Treatment Plan Update (Adult)  Attendees: Patient 06/13/2015 9:36 AM   Family:   06/13/2015 9:36 AM   Physician:  Nehemiah MassedFernando Cobos, MD 06/13/2015 9:36 AM   Nursing:  Quintella ReichertBeverly Knight, RN 06/13/2015 9:36 AM   Clinical Social Worker:  Juline PatchQuylle Mikyle Sox, LCSW 06/13/2015 9:36 AM   Clinical Social Worker:  Samuella BruinKristin Drinkard, LCSW-A 06/13/2015 9:36 AM   Case Manager:  Onnie BoerJennifer [name], RN 06/13/2015 9:36 AM   Other:   06/13/2015 9:36 AM   Other:   06/13/2015  9:36 AM   Other:  06/13/2015 9:36 AM   Other:  06/13/2015 9:36 AM   Other:  06/13/2015 9:36 AM   Other:  Chad CordialValerie Enoch, [facility] Transition Team Coordinator 06/13/2015 9:36 AM   Other:   06/13/2015 9:36 AM   Other:  06/13/2015 9:36 AM   Other:   06/13/2015 9:36 AM    Scribe for Treatment Team:   Wynn BankerHodnett, Shell Yandow Hairston, 06/13/2015   9:36 AM

## 2015-06-13 NOTE — [facility] Group Notes (Signed)
[facility] LCSW Group Therapy 06/13/2015 1:15 PM Type of Therapy: Group Therapy Participation Level: Active  Participation Quality: Attentive, Sharing and Supportive  Affect: Appropriate  Cognitive: Alert and Oriented  Insight: Developing/Improving and Engaged  Engagement in Therapy: Developing/Improving and Engaged  Modes of Intervention: Clarification, Confrontation, Discussion, Education, Exploration, Limit-setting, Orientation, Problem-solving, Rapport Building, Dance movement psychotherapisteality Testing, Socialization and Support  Summary of Progress/Problems: The topic for today was feelings about relapse. Pt discussed what relapse prevention is to them and identified triggers that they are on the path to relapse. Pt processed their feeling towards relapse and was able to relate to peers. Pt discussed coping skills that can be used for relapse prevention. Patient identified her relapse behavior as falling back into depression or suicidal ideations. She shared that after being in denial about her depression for several years, she finally sought help for her mental illness. She reports that she used to feel "weak" for her depression and judged herself harshly for needing help, but that since her hospitalization, she is coming to view her mental well being as important as her physical wellbeing. CSW and other group members provided patient with emotional support and encouragement.   Samuella BruinKristin Rhia Blatchford, MSW, [employer]CSWA Clinical Social Worker [facility] [phone]

## 2015-06-13 NOTE — Progress Notes (Signed)
Recreation Therapy Notes  Date: 07.01.16 Time: 9:30 am Location: [address] Group Room  Group Topic: Stress Management  Goal Area(s) Addresses:  Patient will verbalize importance of using healthy stress management.  Patient will identify positive emotions associated with healthy stress management.   Intervention: Stress Management  Activity :  Guided Training and development officermagery Script.  LRT introduced and educated patients on stress management technique of guided imagery.  A script was used to deliver the technique to patients.  Patients were asked to follow script read aloud by LRT to engage in practicing the stress management technique.  Education:  Stress Management, Discharge Planning.   Education Outcome: Acknowledges edcuation/In group clarification offered/Needs additional education  Clinical Observations/Feedback: Patient did not attend group.   Caroll RancherMarjette Jesusmanuel Erbes, LRT/CTRS  Lillia AbedLindsay, Terril Chestnut A 06/13/2015 1:30 PM

## 2015-06-14 DIAGNOSIS — F322 Major depressive disorder, single episode, severe without psychotic features: Secondary | ICD-10-CM

## 2015-06-14 MED ORDER — ENSURE ENLIVE PO LIQD
237.0000 mL | Freq: Two times a day (BID) | ORAL | Status: DC
  Administered 2015-06-15 – 2015-06-22 (×6): 237 mL via ORAL

## 2015-06-14 NOTE — [facility] Group Notes (Signed)
[facility] LCSW Group Therapy Note  06/14/2015 11:15 AM  Type of Therapy and Topic:  Group Therapy: Avoiding Self-Sabotaging and Enabling Behaviors  Participation Level:  Active   Description of Group:   Learn how to identify obstacles, self-sabotaging and enabling behaviors, what are they, why do we do them and what needs do these behaviors meet? Discuss unhealthy relationships and how to have positive healthy boundaries with those that sabotage and enable. Explore aspects of self-sabotage and enabling in yourself and how to limit these self-destructive behaviors in everyday life.  Therapeutic Goals: 1. Patient will identify one obstacle that relates to self-sabotage and enabling behaviors 2. Patient will identify one personal self-sabotaging or enabling behavior they did prior to admission 3. Patient able to establish a plan to change the above identified behavior they did prior to admission:  4. Patient will demonstrate ability to communicate their needs through discussion and/or role plays.   Summary of Patient Progress: The main focus of today's process group was to explain to the adolescent what "self-sabotage" means and use Motivational Interviewing to discuss what benefits, negative or positive, were involved in a self-identified self-sabotaging behavior. We then talked about reasons the patient may want to change the behavior and their current desire to change. Patient shared stressors of living with her mother and constant intrusiveness of mother. Others in group offered support regarding asking others (friends of her mothers, family) for help yet patient continued to report belief that "asking for help is a sign of weakness." Patient reports she often keeps pain (emotional as well as physical) to herself for "fear of running people away."   Therapeutic Modalities:   Cognitive Behavioral Therapy Person-Centered Therapy Motivational Interviewing   Carney Bernatherine C Adryan Druckenmiller, LCSW

## 2015-06-14 NOTE — Progress Notes (Signed)
D: Patient reports her mood has been up and down. Pt reports this morning was rough but it got better as the day progresses. Pt continue to have passive SI without a plan. Pt reports she talked to her mom who is doing well and have support around her. Pt stated she does not feel guilty if she hurt herself because her mother will be taken care of. Pt contract to come to staff if the feeling gets worse. Pt reports she feels safe here and is engaging in groups and staying in dayroom so she will not to be alone. Pt reports she is sleeping a lot better at night. Pt denies HI/AVH. Cooperative with assessment. No acute distressed noted at this time.   A: Met with pt 1:1. Medications administered as prescribed. Support and encouragement provided. Pt encouraged to discuss feelings and come to staff with any question or concerns.   R: Patient remains safe. She is complaint with medications.

## 2015-06-14 NOTE — Progress Notes (Addendum)
D:  Patient's self inventory sheet, patient has fair sleep, sleep medication is helpful.  Poor appetite, low energy level, good concentration.  Rated depression and anxiety 7, hopeless 8.  Denied withdrawals.  SI, contracts for safety.  Worst pain in past 24 hours knee, shoulder #5.  Goal is to have hope.  "I'm trying."  A:  Medications administered per MD orders.  Emotional support and encouragement given patient. R:  Denied HI.  Denied A/V hallucinations.  SI, contracts for safety.  Safety maintained with 15 minute checks. Patient stated that she has thoughts of hurting herself.  That she is very depressed and feels very hopeless.  That she cannot face what she will have to face after discharge.  She feels very hopeless.  Needs to take care of her mother.  Knows that she cannot work, has no interests in life.  Nurse encouraged patient to eat lunch.  Encouraged patient to drink fluids.   Patient has continually decline ensure today.  Encouraged patient to eat meals/drink fluids.

## 2015-06-14 NOTE — Progress Notes (Signed)
Patient ID: Natasha SartoriusJenifer K Al, female   DOB: 1970-12-21, 44 y.o.   MRN: [id number] [facility]  06/14/2015 3:59 PM Natasha [name]  MRN:  [id number] Subjective:   Pt states: "I'm a lot worse than I was. My depression is worse and I'm having suicidal thoughts".   Objective: Pt seen and chart reviewed. Pt presents as depressed, tearful, hopeless, and worthless. Pt reports that she feels burdened by taking care of her mother with Parkinson's and that she feels helpless and hopeless about this. Pt reports that she feels like a failure, has no purpose in life, and has no identity, now that she is handicapped as a result of her suicide attempt.   During detailed discussion, pt discovered that she feels as though she forces herself to do many things, feeling as though life is rehearsed and mundane, but is honest with herself in that she truly chooses to do these things, yet is very uncomfortable with such process. Therefore, pt is receptive to the idea of exploring a shift in thought process toward consciously choosing to take care of her mother instead of ruminating about how she "has to" do so. Pt will begin with writing exercises that identify activities she is overwhelmed with and will explore her thought process and rumination to determine how to establish a feeling of control, a sense of direction and purpose, and a sense of satisfaction secondary to doing tasks which she feels are important, yet previously forced upon her.   Pt does continue to affirm suicidal ideation, yet denies plan. Denies homicidal ideation and psychosis and does not appear to be responding to internal stimuli. Pt has good insight and is motivated to change, but needs guidance on journaling and coping mechanisms. Pt reports that her only coping mechanisms thus far are reading.     Principal Problem: MDD (major depressive disorder) Diagnosis:   Patient Active Problem List   Diagnosis Date Noted  . MDD (major depressive  disorder) [F32.2] 06/09/2015   Total Time spent with patient: 25 minutes    Past Medical History:  Past Medical History  Diagnosis Date  . C6 spinal cord injury   . Depression     Past Surgical History  Procedure Laterality Date  . Back surgery     Family History: History reviewed. No pertinent family history. Social History:  History  Alcohol Use No     History  Drug Use No    History   Social History  . Marital Status: Single    Spouse Name: N/A  . Number of Children: N/A  . Years of Education: N/A   Social History Main Topics  . Smoking status: Never Smoker   . Smokeless tobacco: Not on file  . Alcohol Use: No  . Drug Use: No  . Sexual Activity: Not on file   Other Topics Concern  . None   Social History Narrative   Additional History:    Sleep:  Fair, but improving   Appetite:  Fair, improving   Assessment: See above  Musculoskeletal: Strength & Muscle Tone: quadriparesia Gait & Station: slow, walks with cane Patient leans: N/A  Psychiatric Specialty Exam: Physical Exam  Review of Systems  Psychiatric/Behavioral: Positive for depression and suicidal ideas. Negative for hallucinations. The patient is nervous/anxious.   All other systems reviewed and are negative.  denies fever, chills, denies dysuria, denies urgency  Blood pressure 125/89, pulse 109, temperature 98.2 F (36.8 C), temperature source Oral, resp. rate 16, height 5'  2" (1.575 m), weight 61.236 kg (135 lb), last menstrual period 06/02/2015, SpO2 98 %.Body mass index is 24.69 kg/(m^2).  General Appearance: improved grooming  Eye Contact::  Good  Speech:  Normal Rate  Volume:  Normal  Mood:  Reports ongoing depression, but feels medication " may be helping "   Affect:   More reactive today, smiles at times appropriately   Thought Process:  Goal Directed and Linear  Orientation:  Full (Time, Place, and Person)  Thought Content:  denies hallucinations, no delusions   Suicidal  Thoughts:  Describes chronic history of intermittent suicidal ideations- at this time, reports "better off if I were gone" but denies plan/intent   Homicidal Thoughts:  No  Memory:  recent and remote grossly intact   Judgement:  Other:  present  Insight:  Present  Psychomotor Activity:  walks slowly  Concentration:  Good  Recall:  Good  Fund of Knowledge:Good  Language: Good  Akathisia:  Negative  Handed:  Right  AIMS (if indicated):     Assets:  Communication Skills Desire for Improvement Resilience  ADL's:  Improving   Cognition: WNL  Sleep:  Number of Hours: 5.25     Current Medications: Current Facility-Administered Medications  Medication Dose Route Frequency Provider Last Rate Last Dose  . acetaminophen (TYLENOL) tablet 650 mg  650 mg Oral Q6H PRN Rachael Fee, MD      . alum & mag hydroxide-simeth (MAALOX/MYLANTA) 200-200-20 MG/5ML suspension 30 mL  30 mL Oral Q4H PRN Rachael Fee, MD   30 mL at 06/13/15 1525  . buPROPion (WELLBUTRIN XL) 24 hr tablet 300 mg  300 mg Oral Daily Craige Cotta, MD   300 mg at 06/14/15 0801  . clonazePAM (KLONOPIN) tablet 2 mg  2 mg Oral QHS Craige Cotta, MD   2 mg at 06/13/15 2117  . feeding supplement (ENSURE ENLIVE) (ENSURE ENLIVE) liquid 237 mL  237 mL Oral BID BM Beau Fanny, FNP      . magnesium hydroxide (MILK OF MAGNESIA) suspension 30 mL  30 mL Oral Daily PRN Rachael Fee, MD      . mirtazapine (REMERON) tablet 7.5 mg  7.5 mg Oral QHS Rockey Situ Cobos, MD   7.5 mg at 06/13/15 2117  . SUMAtriptan (IMITREX) tablet 25 mg  25 mg Oral Q12H PRN Craige Cotta, MD   25 mg at 06/13/15 2117    Lab Results:  No results found for this or any previous visit (from the past 48 hour(s)).  Physical Findings: AIMS: Facial and Oral Movements Muscles of Facial Expression: None, normal Lips and Perioral Area: None, normal Jaw: None, normal Tongue: None, normal,Extremity Movements Upper (arms, wrists, hands, fingers): None,  normal Lower (legs, knees, ankles, toes): None, normal, Trunk Movements Neck, shoulders, hips: None, normal, Overall Severity Severity of abnormal movements (highest score from questions above): None, normal Incapacitation due to abnormal movements: None, normal Patient's awareness of abnormal movements (rate only patient's report): No Awareness, Dental Status Current problems with teeth and/or dentures?: No Does patient usually wear dentures?: No  CIWA:  CIWA-Ar Total: 1 COWS:  COWS Total Score: 3   Assessment  -Depression is worsening secondary to rumination about lack of sense of purpose -Wellbutrin helping slightly in terms of motivation by report, but lacking efficacy for depression by report (informed pt that it may be too soon) -Pt notices no difference on Remeron thus far -TSH slightly elevated, T3/T4 unremarkable- no clear symptoms of hypothyroidism  reported - discussed this with patient , recommended repeat TSH in 5-6 weeks to monitor.  Treatment Plan Summary: MDD (major depressive disorder), moderately improving with intermittent anxiolytic exacerbation, treated as below:   Daily contact with patient to assess and evaluate symptoms and progress in treatment, Medication management, Plan continue inpatient treatment and continue medications as below  -continue Wellbutrin XL  To 300  mgrs QAM to address depression -continue  Klonopin  2 mgrs QHS to address insomnia/night time anxiety -continue Remeron 7.5 mgrs QHS for depression -continue Imitrex  PO q12h PRN for Migraine  Medical Decision Making:  Review of Psycho-Social Stressors (1), Review or order clinical lab tests (1), Established Problem, Worsening (2) and Review of Medication Regimen & Side Effects (2)  Beau Fanny, FNP-BC 06/14/2015, 3:59 PM I agree with assessment and plan Madie Reno A. Dub Mikes, M.D.

## 2015-06-14 NOTE — Progress Notes (Signed)
Adult Psychoeducational Group Note  Date:  06/14/2015 Time:  9:45 PM  Group Topic/Focus:  Wrap-Up Group:   The focus of this group is to help patients review their daily goal of treatment and discuss progress on daily workbooks.  Participation Level:  Active  Participation Quality:  Appropriate  Affect:  Appropriate  Cognitive:  Alert  Insight: Appropriate  Engagement in Group:  Engaged  Modes of Intervention:  Discussion  Additional Comments:  Pt rated her day "6 or 7". She stated she slept well last night with the med adjustment, but when the medication started to ware off she started to feel her depression increasing again.   Kaleen OdeaCOOKE, Maclaine Ahola R 06/14/2015, 9:45 PM

## 2015-06-14 NOTE — [facility] Group Notes (Signed)
The focus of this group is to educate the patient on the purpose and policies of crisis stabilization and provide a format to answer questions about their admission.  The group details unit policies and expectations of patients while admitted.  Patient attended 0900 nurse education orientation group this morning.  Patient actively participated, appropriate affect.  Patient was alert, appropriate insight and appropriate engagement.  Today patient will work on 3 goals for discharge.  

## 2015-06-15 DIAGNOSIS — F322 Major depressive disorder, single episode, severe without psychotic features: Secondary | ICD-10-CM

## 2015-06-15 MED ORDER — HYDROXYZINE HCL 50 MG PO TABS
50.0000 mg | ORAL_TABLET | Freq: Three times a day (TID) | ORAL | Status: DC | PRN
  Administered 2015-06-15 – 2015-06-25 (×16): 50 mg via ORAL
  Filled 2015-06-15 (×6): qty 1
  Filled 2015-06-15: qty 6
  Filled 2015-06-15 (×10): qty 1

## 2015-06-15 NOTE — Progress Notes (Signed)
Pt with complaints of anxiety, at the start of group time with this Clinical research associatewriter and staff. She was given a packet and then observed in front of writer trying to remove staple out of packet to hurt herself. She reported '' I used the staple from before so that I could push it where the lab stuck me. '' she then during group repeatedly hit her head with her cane despite being redirected during group. She continues to report suicidal ideation with Clinical research associatewriter, was able to contract for safety this am, but has repeatedly demonstrated self injurious behaviors. Discussed above with Renata Capriceonrad NP and orders received for 1.1. Monitoring. Notified Environmental education officerhalita RN - AC. Patient placed on 1.1. At this time. Will continue to monitor 1.1. As ordered for safety.

## 2015-06-15 NOTE — [facility] Group Notes (Signed)
[facility] Group Notes:  (Nursing/MHT/Case Management/Adjunct)  Date:  06/15/2015  Time:  12:32 PM  Type of Therapy:  Nurse Education  Participation Level:  Minimal  Participation Quality:  Inattentive  Affect:  Appropriate  Cognitive:  Alert  Insight:  Lacking  Engagement in Group:  Engaged  Modes of Intervention:  Discussion and Education  Summary of Progress/Problems: The purpose of this group is to discuss the topic of the day which is healthy support systems. Patient attended group but appeared anxious. Patient at one time was seen hitting her head against her assistive device. Patient reported that she was anxious. Her goals for today is to, "stay engaged and I need to eat today."   Marzetta BoardDopson, Jahnae Mcadoo E 06/15/2015, 12:32 PM

## 2015-06-15 NOTE — Progress Notes (Signed)
Pt remains 1.1. Monitoring for safety. Pt remains safe, continues to have depressed, anxious mood. She is not engaged in any self injurious behaviors at this time, nor has she been since being on 1.1. Pt is safe, will continue to monitor q 15 minutes for safety.

## 2015-06-15 NOTE — Progress Notes (Signed)
Adult Psychoeducational Group Note  Date:  06/15/2015 Time:  2000   Group Topic/Focus:  Wrap-Up Group:   The focus of this group is to help patients review their daily goal of treatment and discuss progress on daily workbooks.  Participation Level:  Minimal  Participation Quality:  Appropriate  Affect:  Flat  Cognitive:  Appropriate  Insight: Improving  Engagement in Group:  Engaged  Modes of Intervention:  Rapport Building  Additional Comments:  PT. STATED SHE HAD GOALS TODAY BUT THEY DIDN'T GO WELL.  PT. DID NOT GO INTO DETAIL WITH WHAT THOSE GOALS WERE.  PATIENT STATED SHE WAS ABLE TO EAT FOOD TODAY.  PATIENT STATED SHE SPOKE TO A FAMILY MEMBER TODAY.  PATIENT STATED SHE WAS GLAD TO BE HERE.    Estevan OaksWhitaker, Sumie Remsen Shaunte 06/15/2015, 9:06 PM

## 2015-06-15 NOTE — Plan of Care (Signed)
Problem: Diagnosis: Increased Risk For Suicide Attempt Goal: STG-Patient Will Comply With Medication Regime Outcome: Progressing Pt has been compliant with medications, increased observation today to 1.1. For safety due to self injurious behaviors.

## 2015-06-15 NOTE — Progress Notes (Signed)
Patient ID: Natasha SartoriusJenifer K Krajewski, female   DOB: [birth year], 44 y.o.   MRN: [id number] [facility]  06/15/2015 3:51 PM Natasha SartoriusJenifer K Eleazer  MRN:  [id number] Subjective:   Pt states: "I'm really not making any progress".  Objective: Pt seen and chart reviewed. Pt continues to present as depressed, tearful, hopeless, and worthless. Pt reports that she doesn't know where to begin and that the goals we set together during the assessment yesterday are more than she can handle. Discussed the idea of breaking large goals into smaller pieces so pt can evaluate one step at a time, primarily written down in her journal so that she can more easily process and monitor her progress. Pt resistant to this idea but willing to try. Pt reports very poor appetite and that she has not been drinking water or having snacks or Ensures. Pt in agreement to try small snacks and eat more. Per nursing staff, pt did eat pretzels and a drink although this is concerning. Pt minimizes suicidal ideation but is visibly depressed and worthless, reporting that perhaps "it might be better" if she weren't here.   Principal Problem: MDD (major depressive disorder) Diagnosis:   Patient Active Problem List   Diagnosis Date Noted  . Major depressive disorder, single episode, severe without psychotic features [F32.2]   . MDD (major depressive disorder) [F32.2] 06/09/2015   Total Time spent with patient: 25 minutes    Past Medical History:  Past Medical History  Diagnosis Date  . C6 spinal cord injury   . Depression     Past Surgical History  Procedure Laterality Date  . Back surgery     Family History: History reviewed. No pertinent family history. Social History:  History  Alcohol Use No     History  Drug Use No    History   Social History  . Marital Status: Single    Spouse Name: N/A  . Number of Children: N/A  . Years of Education: N/A   Social History Main Topics  . Smoking status: Never Smoker   . Smokeless  tobacco: Not on file  . Alcohol Use: No  . Drug Use: No  . Sexual Activity: Not on file   Other Topics Concern  . None   Social History Narrative   Additional History:    Sleep:  Fair, but improving   Appetite:  Poor   Assessment: See above  Musculoskeletal: Strength & Muscle Tone: quadriparesia Gait & Station: slow, walks with cane Patient leans: N/A  Psychiatric Specialty Exam: Physical Exam  Review of Systems  Psychiatric/Behavioral: Positive for depression and suicidal ideas. Negative for hallucinations. The patient is nervous/anxious.   All other systems reviewed and are negative.  denies fever, chills, denies dysuria, denies urgency  Blood pressure 133/94, pulse 97, temperature 97.6 F (36.4 C), temperature source Oral, resp. rate 16, height 5\' 2"  (1.575 m), weight 61.236 kg (135 lb), last menstrual period 06/02/2015, SpO2 98 %.Body mass index is 24.69 kg/(m^2).  General Appearance: improved grooming  Eye Contact::  Good  Speech:  Normal Rate  Volume:  Normal  Mood:  Reports ongoing depression, but feels medication " may be helping "   Affect:   More reactive today, smiles at times appropriately   Thought Process:  Goal Directed and Linear  Orientation:  Full (Time, Place, and Person)  Thought Content:  denies hallucinations, no delusions   Suicidal Thoughts:  Describes chronic history of intermittent suicidal ideations- at this time, reports "better off  if I were gone" but denies plan/intent   Homicidal Thoughts:  No  Memory:  recent and remote grossly intact   Judgement:  Other:  present  Insight:  Present  Psychomotor Activity:  walks slowly  Concentration:  Good  Recall:  Good  Fund of Knowledge:Good  Language: Good  Akathisia:  Negative  Handed:  Right  AIMS (if indicated):     Assets:  Communication Skills Desire for Improvement Resilience  ADL's:  Improving   Cognition: WNL  Sleep:  Number of Hours: 3     Current Medications: Current  Facility-Administered Medications  Medication Dose Route Frequency Provider Last Rate Last Dose  . acetaminophen (TYLENOL) tablet 650 mg  650 mg Oral Q6H PRN Rachael Fee, MD      . alum & mag hydroxide-simeth (MAALOX/MYLANTA) 200-200-20 MG/5ML suspension 30 mL  30 mL Oral Q4H PRN Rachael Fee, MD   30 mL at 06/13/15 1525  . buPROPion (WELLBUTRIN XL) 24 hr tablet 300 mg  300 mg Oral Daily Craige Cotta, MD   300 mg at 06/15/15 0818  . clonazePAM (KLONOPIN) tablet 2 mg  2 mg Oral QHS Craige Cotta, MD   2 mg at 06/14/15 2231  . feeding supplement (ENSURE ENLIVE) (ENSURE ENLIVE) liquid 237 mL  237 mL Oral BID BM Beau Fanny, FNP   237 mL at 06/15/15 0818  . hydrOXYzine (ATARAX/VISTARIL) tablet 50 mg  50 mg Oral TID PRN Beau Fanny, FNP   50 mg at 06/15/15 0951  . magnesium hydroxide (MILK OF MAGNESIA) suspension 30 mL  30 mL Oral Daily PRN Rachael Fee, MD      . mirtazapine (REMERON) tablet 7.5 mg  7.5 mg Oral QHS Rockey Situ Cobos, MD   7.5 mg at 06/14/15 2231  . SUMAtriptan (IMITREX) tablet 25 mg  25 mg Oral Q12H PRN Craige Cotta, MD   25 mg at 06/15/15 1329    Lab Results:  No results found for this or any previous visit (from the past 48 hour(s)).  Physical Findings: AIMS: Facial and Oral Movements Muscles of Facial Expression: None, normal Lips and Perioral Area: None, normal Jaw: None, normal Tongue: None, normal,Extremity Movements Upper (arms, wrists, hands, fingers): None, normal Lower (legs, knees, ankles, toes): None, normal, Trunk Movements Neck, shoulders, hips: None, normal, Overall Severity Severity of abnormal movements (highest score from questions above): None, normal Incapacitation due to abnormal movements: None, normal Patient's awareness of abnormal movements (rate only patient's report): No Awareness, Dental Status Current problems with teeth and/or dentures?: No Does patient usually wear dentures?: No  CIWA:  CIWA-Ar Total: 1 COWS:  COWS Total  Score: 3   Assessment  -Depression is worsening secondary to rumination about lack of sense of purpose -Wellbutrin helping slightly in terms of motivation by report, but lacking efficacy for depression by report (informed pt that it may be too soon) -Pt notices no difference on Remeron thus far -TSH slightly elevated, T3/T4 unremarkable- no clear symptoms of hypothyroidism reported - discussed this with patient , recommended repeat TSH in 5-6 weeks to monitor.  Treatment Plan Summary: MDD (major depressive disorder), intermittently improving with anxiolytic exacerbation, treated as below:   Daily contact with patient to assess and evaluate symptoms and progress in treatment, Medication management, Plan continue inpatient treatment and continue medications as below  -continue Wellbutrin XL  To 300  mgrs QAM to address depression -continue  Klonopin  2 mgrs QHS to address insomnia/night time  anxiety -continue Remeron 7.5 mgrs QHS for depression -continue Imitrex 25mg  PO q12h PRN for Migraine  *Dietitian consult for caloric management and ideas to get pt to eat  Medical Decision Making:  Review of Psycho-Social Stressors (1), Review or order clinical lab tests (1), Established Problem, Worsening (2) and Review of Medication Regimen & Side Effects (2)  Beau Fanny, FNP-BC 06/15/2015, 3:51 PM I agree with assessment and plan Madie Reno A. Dub Mikes, M.D.

## 2015-06-15 NOTE — Plan of Care (Signed)
Problem: Diagnosis: Increased Risk For Suicide Attempt Goal: STG-Patient Will Comply With Medication Regime Outcome: Progressing Pt compliant with medication     

## 2015-06-15 NOTE — Progress Notes (Signed)
Pt reports prn for anxiety helpful and is requesting to come off 1.1 observation that she can '' contract now '' notified Renata Capriceonrad NP. Patient made aware Np to come and see later today.

## 2015-06-15 NOTE — Progress Notes (Signed)
Patient ID: Natasha SartoriusJenifer K Durocher, female   DOB: 02/28/1971, 44 y.o.   MRN: [id number] D: Client visible on unit, in day room watching TV and playing cards, reports depression "5" of 10 and anxiety "7" of 10. "medication work, but don't think they have reached their full effect yet" "I really tired though" Client reports mom came to visit today although she really didn't want her to "I need my space" "I finally had to tell her I need to do this alone and after this I'll be home" client reports mom has Parkinson's disease, is 46 yo and she doesn't really want her out driving. "I'm just worried how she going to take what I told her" Client reports suicidal ideations "I have them all the time" A: Writer provided emotional support, encouraged her to notify staff if thoughts become an actual plan of hurting self. Staff will monitor 1:1 for safety as client does not contract for safety. R: Client is safe on the unit, maintained with 1:1 sitter.

## 2015-06-15 NOTE — Progress Notes (Signed)
Pt remains on 1.1. For safety . She is currently resting in the dayroom, in no acute distress. Patient in no acute distress at this time. She is safe, will continue to monitor 1.1 as ordered.

## 2015-06-15 NOTE — [facility] Group Notes (Signed)
[facility] LCSW Group Therapy  06/15/2015 11:15 AM  Type of Therapy:  Group Therapy  Participation Level:  Active  Participation Quality:  Attentive and responsive to direct questions  Affect:  Flat  Cognitive:  Oriented  Insight:  Limited  Engagement in Therapy:  Limited  Modes of Intervention:  Discussion, Problem-solving, Rapport Building, Socialization and Support  Summary of Progress/Problems: Topic for today was thoughts and feelings regarding discharge. We discussed fears of upcoming changes including judegements, expectations and stigma of mental health issues. We then discussed supports: what constitutes a supportive framework, identification of supports and what to do when others are not supportive. Patient's then identified a specific coping tool to use when others are not available.  Patient shared that she was unable to report anything she is looking forward to in the nexts 6 months. Later as others described their goals she identified with desire to have more serenity in her life yet was unable/unwilling to cite anything she could do to increase her serenity. Patient was able to identify need for group support later today following a difficult phone call she needs to make. Others offered support.   Carney Bernatherine C Riata Ikeda, LCSW

## 2015-06-16 NOTE — Plan of Care (Signed)
Problem: Alteration in mood Goal: STG-Patient is able to discuss feelings and issues (Patient is able to discuss feelings and issues leading to depression)  Outcome: Progressing Client felt comfortable enough with writer to discuss feelings about living with mom. "I need some space" "I told her I need to work through this alone and then I'll be home"

## 2015-06-16 NOTE — Progress Notes (Addendum)
1215  Patient's 1:1 has been discontinued.  Patient on phone talking to her mother.  Patient stated she continues to have SI thoughts, no plan and is not looking for a plan to hurt herself, contracts for safety.  Denied HI.  Denied A/V hallucinations.  Patient sitting in dayroom, cane beside her for use.  Respirations even and unlabored.  No signs/symptoms of pain/distress noted on patient's face/body movements.  Emotional support and encouragement given patient.  Safety maintained.  1415  Patient ate her lunch in dayroom.  Patient continues SI thoughts, no plan, contracts for safety.  Denied HI.  Denied A/V hallucinations.  Patient has been given several cups of gingerale today.  Respirations even and unlabored.  No signs/symptoms of pain/distress noted on patient's face/body movements.  Safety maintained with 15 minute checks.  1615  Patient has been sitting in dayroom this afternoon, watching tv and talking to peers and staff.  Patient continue to state she does have SI thoughts, no plan, contracts for safety.   That she does feel better today than yesterday.  Denied HI.  Denied A/V hallucinations.  Denied pain.  Respirations even and unlabored.  No signs/symptoms of pain/distress noted on patient's face/body movements.  1815  Patient was given broth, ginger ale for dinner.  Patient continues to sit in dayroom with cane, watching tv and talking to peers and staff.  Patient continues to say that she has suicidal thoughts, no plan, contracts for safety.  Denied HI.  Denied A/V hallucinations.  Denied pain.  Patient has taken short naps while sitting in dayroom.  Respirations even and unlabored.  No signs/symptoms of pain/distress noted on patient's face/body movements.  Safety maintained with 15 minute checks.

## 2015-06-16 NOTE — Progress Notes (Signed)
Post 1:1 Note  Pt day room withdrawn to self. Pt denied SI at this time but can't say what may happen when she leaves; she states, "I am not going to hurt myself while am here but I can't say what would happen when I go home". Pt also contracted for safety saying she would let staff know when she feels suicidal. 15 minutes safety checks continue.

## 2015-06-16 NOTE — Plan of Care (Signed)
Problem: Consults Goal: Suicide Risk Patient Education (See Patient Education module for education specifics)  Outcome: Progressing Nurse discussed suicidal thoughts, plans, coping skills with patient.

## 2015-06-16 NOTE — [facility] Group Notes (Signed)
   [facility]  06/16/2015  8:45 AM   Participation Quality: Alert, Appropriate and Oriented  Mood/Affect: Appropriate  Depression Rating: 5  Anxiety Rating: 7-8  Thoughts of Suicide: Pt endorses passive SI but contracts for safety  Will you contract for safety? Yes  Current AVH: Pt denies  Plan for Discharge/Comments: Pt attended discharge planning group and actively participated in group. CSW provided pt with today's workbook. Patient reports feeling "better" today.  Transportation Means: Pt reports access to transportation  Supports: No supports mentioned at this time  Samuella BruinKristin Eiley Mcginnity, MSW, [employer]CS[employer] Clinical Social Worker [facility] [phone]

## 2015-06-16 NOTE — Progress Notes (Addendum)
0745  Patient sitting in dayroom with 1:1 for safety.  Patient stated she always has SI thoughts, no plan, has stopped looking for ways to hurt herself. Contracts for safety.  Denied HI.  Denied A/V hallucinations.  Denied pain.  Ambulated to dayroom with cane.  Used wheelchair from bed to bathroom this morning.  Respirations even and unlabored.  No signs/symptoms of pain/distress noted on patient's face/body movements.  Patient stated she does not want 1:1 now.  That she will not hurt herself.  Stated she slept good last night and her appetite has returned.  That she ate a good breakfast.  1:1 remains for safety.  0900  Patient continues to sit in dayroom with cane beside chair and 1:1 present.  Respirations even and unlabored.  No signs/symptoms of pain/distress noted on patient's face/body movements.  Patient stated she still wants to have 1:1 discontinued.  That she always has SI thoughts but is trying to control those thoughts, does not have a plan to hurt herself, contracts for safety.  Denied HI.  Denied A/V hallucinations.  Denied pain.  1:1 continues for safety.

## 2015-06-17 MED ORDER — MIRTAZAPINE 15 MG PO TABS
15.0000 mg | ORAL_TABLET | Freq: Every day | ORAL | Status: DC
  Administered 2015-06-17 – 2015-06-18 (×2): 15 mg via ORAL
  Filled 2015-06-17 (×3): qty 1

## 2015-06-17 NOTE — [facility] Group Notes (Signed)
[facility] LCSW Group Therapy  06/17/2015   1:15 PM   Type of Therapy:  Group Therapy  Participation Level:  Active  Participation Quality:  Attentive, Sharing and Supportive  Affect:  Appropriate  Cognitive:  Alert and Oriented  Insight:  Developing/Improving and Engaged  Engagement in Therapy:  Developing/Improving and Engaged  Modes of Intervention:  Clarification, Confrontation, Discussion, Education, Exploration, Limit-setting, Orientation, Problem-solving, Rapport Building, Dance movement psychotherapisteality Testing, Socialization and Support  Summary of Progress/Problems: The topic for group therapy was feelings about diagnosis.  Pt actively participated in group discussion on their past and current diagnosis and how they feel towards this.  Pt also identified how society and family members judge them, based on their diagnosis as well as stereotypes and stigmas.  Patient discussed her conflict between knowing about her mental illness vs. Acceptance of her mental illness. She shared that she does not share her diagnosis with others due to feelings of shame and guilt. She discussed that she has a different perspective on the world and can relate to others better due to experiencing mental illness. CSW and other group members provided patient with emotional support and encouragement.  Samuella BruinKristin Bianco Cange, MSW, [employer]CS[employer] Clinical Social Worker [facility] [phone]

## 2015-06-17 NOTE — [facility] Group Notes (Signed)
The focus of this group is to educate the patient on the purpose and policies of crisis stabilization and provide a format to answer questions about their admission.  The group details unit policies and expectations of patients while admitted.  Patient attended 0900 nurse education orientation group this morning.  Patient actively participated, appropriate affect.  Patient was alert, appropriate insight and engagement.  Today patient will work on 3 goals for discharge.  

## 2015-06-17 NOTE — Progress Notes (Signed)
Recreation Therapy Notes  Animal-Assisted Activity (AAA) Program Checklist/Progress Notes Patient Eligibility Criteria Checklist & Daily Group note for Rec Tx Intervention  Date: 07.05.16 Time: 230 pm Location: 400 Hall Dayroom   AAA/T Program Assumption of Risk Form signed by Patient/ or Parent Legal Guardian yes  Patient is free of allergies or sever asthma yes  Patient reports no fear of animals yes  Patient reports no history of cruelty to animalsyes  Patient understands his/her participation is voluntary yes  Patient washes hands before animal contact yes  Patient washes hands after animal contact yes  Behavioral Response:  Engaged  Education: Charity fundraiserHand Washing, Appropriate Animal Interaction   Education Outcome: Acknowledges understanding/In group clarification offered/Needs additional education.   Clinical Observations/Feedback: Patient attended group.   Natasha RancherMarjette Skyy [name], LRT/CTRS   Natasha RancherLindsay, Natasha [name] A 06/17/2015 4:47 PM

## 2015-06-17 NOTE — Progress Notes (Signed)
Patient ID: Natasha [name], female   DOB: May 20, 1971, 44 y.o.   MRN: [id number] [facility] MD Progress Note  06/17/2015 1:22 PM [name]  MRN:  [id number] Subjective:  Patient reports overall improvement compared to admission and feels less depressed. She does continue to have chronic self injurious ideations, particularly of cutting or scratching on herself , but at this time does not endorse suicidal ideations. She does state she remains very anxious about being discharged and facing her stressors again- particularly concern that mother has Parkinson's Disorder and is deteriorating.  She tends to have a lot of guilt relating to not being able to help mother more, and these guilty ideations often lead to thoughts of cutting as a way of "atoning " . She is cognizant of this psychodynamic process , and states she is working to address it. Regarding medications , states she feels they are working and states " my mood is really much better". She complains of anorexia, which may be related to Wellbutrin  Trial, but is reluctant to D/C this medication as feeling better. Objective : I have discussed case with treatment team and have met with patient. Patient visible on unit, going to groups At this time off 1:1, and denying any current SI. She does have chronic self injurious ideations ( scratching, cutting thoughts ) which are chronic and have been present  ( on and off, depending on psychosocial stressors , as per her description) for years . She reports her mood is better, and feels medications are helping significantly and are overall well tolerated, although complains of anorexia, which may be related to Wellbutrin trial.. No disruptive behaviors on the unit. Significant anxiety about discharge and returning to psychosocial stressors.  Responsive to support and encouragement , empathy.  Principal Problem: MDD (major depressive disorder) Diagnosis:   Patient Active Problem List   Diagnosis Date Noted   . Major depressive disorder, single episode, severe without psychotic features [F32.2]   . MDD (major depressive disorder) [F32.2] 06/09/2015   Total Time spent with patient: 25 minutes    Past Medical History:  Past Medical History  Diagnosis Date  . C6 spinal cord injury   . Depression     Past Surgical History  Procedure Laterality Date  . Back surgery     Family History: History reviewed. No pertinent family history. Social History:  History  Alcohol Use No     History  Drug Use No    History   Social History  . Marital Status: Single    Spouse Name: N/A  . Number of Children: N/A  . Years of Education: N/A   Social History Main Topics  . Smoking status: Never Smoker   . Smokeless tobacco: Not on file  . Alcohol Use: No  . Drug Use: No  . Sexual Activity: Not on file   Other Topics Concern  . None   Social History Narrative   Additional History:    Sleep:  Fair  Appetite:  Poor   Assessment:   Musculoskeletal: Strength & Muscle Tone: [place]: slow, walks with cane Patient leans: N/A   Psychiatric Specialty Exam: Physical Exam  ROS denies fever, chills, denies dysuria, denies urgency  Blood pressure 119/71, pulse 88, temperature 98.6 F (37 C), temperature source Oral, resp. rate 16, height 5' 2"  (1.575 m), weight 135 lb (61.236 kg), last menstrual period 06/02/2015, SpO2 98 %.Body mass index is 24.69 kg/(m^2).  General Appearance: improved grooming  Eye Contact::  Good  Speech:  Normal Rate  Volume:  Normal  Mood:  Reports mood as improved   Affect:   Reactive, smiles at times appropriately. Also labile, and briefly tearful when discussing mother 's chronic illness   Thought Process:  Goal Directed and Linear  Orientation:  Full (Time, Place, and Person)  Thought Content:  denies hallucinations, no delusions   Suicidal Thoughts:  Describes chronic history of intermittent self injurious - at this time denies any current  thoughts, plans or intentions of suicide.   Homicidal Thoughts:  No  Memory:  recent and remote grossly intact   Judgement:  Other:  present  Insight:  Present  Psychomotor Activity:  walks slowly due to quadriparesia, denies falls   Concentration:  Good  Recall:  Good  Fund of Knowledge:Good  Language: Good  Akathisia:  Negative  Handed:  Right  AIMS (if indicated):     Assets:  Communication Skills Desire for Improvement Resilience  ADL's:  Improving   Cognition: WNL  Sleep:  Number of Hours: 3     Current Medications: Current Facility-Administered Medications  Medication Dose Route Frequency Provider Last Rate Last Dose  . acetaminophen (TYLENOL) tablet 650 mg  650 mg Oral Q6H PRN Nicholaus Bloom, MD      . alum & mag hydroxide-simeth (MAALOX/MYLANTA) 200-200-20 MG/5ML suspension 30 mL  30 mL Oral Q4H PRN Nicholaus Bloom, MD   30 mL at 06/13/15 1525  . buPROPion (WELLBUTRIN XL) 24 hr tablet 300 mg  300 mg Oral Daily [name], MD   300 mg at 06/17/15 0725  . clonazePAM (KLONOPIN) tablet 2 mg  2 mg Oral QHS [name], MD   2 mg at 06/16/15 2154  . feeding supplement (ENSURE ENLIVE) (ENSURE ENLIVE) liquid 237 mL  237 mL Oral BID BM Benjamine Mola, FNP   237 mL at 06/15/15 0818  . hydrOXYzine (ATARAX/VISTARIL) tablet 50 mg  50 mg Oral TID PRN Benjamine Mola, FNP   50 mg at 06/17/15 0725  . magnesium hydroxide (MILK OF MAGNESIA) suspension 30 mL  30 mL Oral Daily PRN Nicholaus Bloom, MD      . mirtazapine (REMERON) tablet 7.5 mg  7.5 mg Oral QHS [name] Dionte Blaustein, MD   7.5 mg at 06/16/15 2155  . SUMAtriptan (IMITREX) tablet 25 mg  25 mg Oral Q12H PRN [name], MD   25 mg at 06/15/15 1329    Lab Results:  No results found for this or any previous visit (from the past 48 hour(s)).  Physical Findings: AIMS: Facial and Oral Movements Muscles of Facial Expression: None, normal Lips and Perioral Area: None, normal Jaw: None, normal Tongue: None, normal,Extremity  Movements Upper (arms, wrists, hands, fingers): None, normal Lower (legs, knees, ankles, toes): None, normal, Trunk Movements Neck, shoulders, hips: None, normal, Overall Severity Severity of abnormal movements (highest score from questions above): None, normal Incapacitation due to abnormal movements: None, normal Patient's awareness of abnormal movements (rate only patient's report): No Awareness, Dental Status Current problems with teeth and/or dentures?: No Does patient usually wear dentures?: No  CIWA:  CIWA-Ar Total: 2 COWS:  COWS Total Score: 4   Assessment -  Mood is improved, and at this time affect is more reactive. Regarding self injurious ideations, these are chronic, and have exacerbated over recent weeks related to mother deteriorating medically. Denies , however, any suicidal ideations, and recent thoughts have been more about scratching or cutting on herself,  but without any suicidal intent. At this time feels she is able to abstain from self injurious behaviors/acting out on thoughts . Anxiety persists, and revolves mostly around returning home and dealing with mother's chronic illness. Reports anorexia, possibly related to Wellbutrin XL trial, but reluctant to stop medication as feeling better. Berton Lan is chronic, stable, and well managed by patient.    Treatment Plan Summary: Daily contact with patient to assess and evaluate symptoms and progress in treatment, Medication management, Plan continue inpatient treatment and continue medications as below   Continue Wellbutrin XL   300  mgrs QAM to address depression Monitor weight daily, to monitor report of poor appetite  Continue  Klonopin  2 mgrs QHS to address insomnia/night time anxiety Increase  Remeron  To 15 mgrs QHS for depression/insomnia Imitrex PRNs for Migraine if needed  Treatment team working on disposition plans /options Medical Decision Making:  Established Problem, Stable/Improving (1), Review of  Psycho-Social Stressors (1), Review or order clinical lab tests (1) and Review of Medication Regimen & Side Effects (2)     Sheritta Deeg 06/17/2015, 1:22 PM

## 2015-06-17 NOTE — Plan of Care (Signed)
Problem: Consults Goal: Depression Patient Education See Patient Education Module for education specifics.  Outcome: Progressing Nurse discussed depression/coping skills with patient.        

## 2015-06-17 NOTE — Progress Notes (Signed)
Adult Psychoeducational Group Note  Date:  06/17/2015 Time:  11:04 AM  Group Topic/Focus:  Recovery Goals:   The focus of this group is to identify appropriate goals for recovery and establish a plan to achieve them.  Participation Level:  Active  Participation Quality:  Appropriate  Affect:  Appropriate  Cognitive:  Appropriate  Insight: Good  Engagement in Group:  Engaged  Modes of Intervention:  Discussion   Additional Comments:  Pt attended recovery group this morning. Pt participate in group. Pt was able to share in group.    Oreoluwa Gilmer A 06/17/2015, 11:04 AM

## 2015-06-17 NOTE — Progress Notes (Signed)
Patient ID: [name], female   DOB: 10/15/1971, 44 y.o.   MRN: [id number] PER STATE REGULATIONS 482.30  THIS CHART WAS REVIEWED FOR MEDICAL NECESSITY WITH RESPECT TO THE PATIENT'S ADMISSION/ DURATION OF STAY.  NEXT REVIEW DATE: 06/21/2015  Natasha RoughJENNIFER JONES Charle Clear, RN, BSN CASE MANAGER'

## 2015-06-17 NOTE — Progress Notes (Signed)
D:  Patient's self inventory sheet, patient sleeps good, sleep medication is helpful.  Poor appetite, normal energy level, good concentration.  Rated depression 7, hopeless and anxiety 10.  Denied withdrawals.  SI, contracts for safety, no plan.  Denied physical problems and pain.  Goal is to concentrate on her problems to help herself.  Plans to talk to MD.   A  Medications administered per MD orders.  Emotional support and encouragement given patient. R:  Denied HI.  Denied A/V hallucinations.  SI, no plan, contracts for safety. Patient stated she feels that she should be farther along than she is at this time.  Patient stated her parents compared her to other people's children, that she is afraid she will not measure up.  That she is always being tested, and this time the test is whether or not she can take care of her mother during her mother's illness.  She was taught to "measure up, keep appearances".  Did not matter what was going on inside of her.  Neighbors could not know the truth.  Always put on a front.  "If I go home and can't make the grade, I'm going to do it again and this time I won't fail the test."

## 2015-06-17 NOTE — Progress Notes (Signed)
D: Pt with flat affect continues to c/o severely depressed with some mild anxiety.  Pt denies SI/HI and contracted for safety at this time.  Pt however verbalizes not knowing what she would do once she leaves; she states, "I will contract for safety while am here but don't can't say that I wouldn't finish it once I get home-there wouldn't be mistakes this time around."  Pt also states however that it will be hard to leave her mom with Parkinson alone by herself.  Pt is alert and oriented x 4 but withdrawn to self A: Medications administered as prescribed.  Support, encouragement and safe environment provided. 15-minute safety checks continue. R: Pt was med compliant. Safety checks continue.

## 2015-06-17 NOTE — Progress Notes (Signed)
PT'S WEIGHT IS 127 LBS
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
## 2015-06-17 NOTE — Progress Notes (Signed)
Adult Psychoeducational Group Note  Date:  06/17/2015 Time:  10:13 PM  Group Topic/Focus:  Wrap-Up Group:   The focus of this group is to help patients review their daily goal of treatment and discuss progress on daily workbooks.  Participation Level:  Active  Participation Quality:  Appropriate  Affect:  Appropriate   Cognitive:  Appropriate  Insight: Good  Engagement in Group:  Engaged  Modes of Intervention:  Support  Additional Comments:  Patient expressed that she had an ok day, rated her day a 4. Stated that she wanted to set small goals for herself rather than big goals so she could accomplish them and through her day.  Natasha DiceKiara M [name] Kunzler 06/17/2015, 10:13 PM

## 2015-06-18 NOTE — Progress Notes (Signed)
Recreation Therapy Notes  Date: 07.06.16 Time: 930 am Location: 300 Hall Group Room  Group Topic: Stress Management  Goal Area(s) Addresses:  Patient will verbalize importance of using healthy stress management.  Patient will identify positive emotions associated with healthy stress management.   Intervention: Stress Management  Activity :  Guided Imagery.  LRT introduced and educated patients on the stress management technique of guided imagery.  A script was used to deliver the technique to patients.  Patients were asked to follow the script read a loud by LRT to engage in practicing the stress management technique.  Education:  Stress Management, Discharge Planning.   Education Outcome: Acknowledges edcuation/In group clarification offered/Needs additional education  Clinical Observations/Feedback: Did not attend group.    Deonta Bomberger, LRT/CTRS         Taiven Greenley A 06/18/2015 3:48 PM 

## 2015-06-18 NOTE — [facility] Group Notes (Signed)
[facility] LCSW Group Therapy 06/18/2015  1:15 PM Type of Therapy: Group Therapy Participation Level: Active  Participation Quality: Attentive, Sharing and Supportive  Affect: Depressed and Flat  Cognitive: Alert and Oriented  Insight: Developing/Improving and Engaged  Engagement in Therapy: Developing/Improving and Engaged  Modes of Intervention: Clarification, Confrontation, Discussion, Education, Exploration, Limit-setting, Orientation, Problem-solving, Rapport Building, Dance movement psychotherapisteality Testing, Socialization and Support  Summary of Progress/Problems: The topic for group today was emotional regulation. This group focused on both positive and negative emotion identification and allowed group members to process ways to identify feelings, regulate negative emotions, and find healthy ways to manage internal/external emotions. Group members were asked to reflect on a time when their reaction to an emotion led to a negative outcome and explored how alternative responses using emotion regulation would have benefited them. Group members were also asked to discuss a time when emotion regulation was utilized when a negative emotion was experienced. Patient engaged in discussion regarding emotions, stressors, and coping skills. Patient identified often feeling "overwhelmed or conflicted". CSW and other group members provided patient with emotional support and encouragement.   Samuella BruinKristin Cletis Muma, MSW, [employer] Clinical Social Worker [facility] [phone]

## 2015-06-18 NOTE — Progress Notes (Signed)
Patient ID: Natasha [name], female   DOB: 1971-02-18, 44 y.o.   MRN: [id number] [facility] MD Progress Note  06/18/2015 6:04 PM Natasha [name]  MRN:  [id number] Subjective: Patient states her mood is better. She continues to have suicidal ideations- she states she has no intention of hurting herself on the unit , and is able to contract for safety at this time. She states she has chronic suicidal ideations, and tends to think of it as " a rational problem- is it worth living or is it not?"  She states that issues that would cause her to feel that life is not worth living would be deterioration of mother's health, loneliness .  States, however, that she has been visited by mother regularly, and that mother is currently doing better, and " seemed to be doing amazingly well when she came to visit".  Objective : I have discussed case with treatment team and have met with patient. Patient visible on unit, going to groups.  No disruptive behaviors on unit.  No self injurious ideations on unit at this time. As noted, she states she is not currently having imminent suicidal ideations or plans, but that she tends to consider suicide as a " rational way out if life gets too miserable".  Suicidal ideations have been chronic , and as noted patient does have history of suicide attempts in the past .  Wellbutrin may be associated with anorexia. She is drinking fluids, and vitals are stable  Mood is improved, and she is presenting with improved mood and affect. She also expresses being happy that her mother seems to be doing better. Responsive to support and encouragement , empathy. She states that factors that would protect her from suicide include not wanting to make her mother suffer, and states she retains some hope that " I'll be able to stay happier in the future ".   Principal Problem: MDD (major depressive disorder) Diagnosis:   Patient Active Problem List   Diagnosis Date Noted  . Major depressive disorder,  single episode, severe without psychotic features [F32.2]   . MDD (major depressive disorder) [F32.2] 06/09/2015   Total Time spent with patient: 25 minutes    Past Medical History:  Past Medical History  Diagnosis Date  . C6 spinal cord injury   . Depression     Past Surgical History  Procedure Laterality Date  . Back surgery     Family History: History reviewed. No pertinent family history. Social History:  History  Alcohol Use No     History  Drug Use No    History   Social History  . Marital Status: Single    Spouse Name: N/A  . Number of Children: N/A  . Years of Education: N/A   Social History Main Topics  . Smoking status: Never Smoker   . Smokeless tobacco: Not on file  . Alcohol Use: No  . Drug Use: No  . Sexual Activity: Not on file   Other Topics Concern  . None   Social History Narrative   Additional History:    Sleep:  Fair  Appetite:  Poor   Assessment:   Musculoskeletal: Strength & Muscle Tone: [place]: slow, walks with cane Patient leans: N/A   Psychiatric Specialty Exam: Physical Exam  ROS denies fever, chills, denies dysuria, denies urgency  Blood pressure 119/71, pulse 88, temperature 98.6 F (37 C), temperature source Oral, resp. rate 16, height 5' 2"  (1.575 m), weight 127 lb (57.607  kg), last menstrual period 06/02/2015, SpO2 98 %.Body mass index is 23.22 kg/(m^2).  General Appearance: Well Groomed  Engineer, water::  Good  Speech:  Normal Rate  Volume:  Normal  Mood:  Reports mood as improved   Affect:   Affect more reactive   Thought Process:  Goal Directed and Linear  Orientation:  Full (Time, Place, and Person)  Thought Content:  denies hallucinations, no delusions   Suicidal Thoughts:  Chronic suicidal ideations, at this time describes suicidal ideations as a " rational decision I have to make if things get worse ". Denies any current plan or intention of hurting self , SI on unit .  Homicidal  Thoughts:  No  Memory:  recent and remote grossly intact   Judgement:  Other:  present  Insight:  Present  Psychomotor Activity:  walks slowly due to quadriparesia, denies falls   Concentration:  Good  Recall:  Good  Fund of Knowledge:Good  Language: Good  Akathisia:  Negative  Handed:  Right  AIMS (if indicated):     Assets:  Communication Skills Desire for Improvement Resilience  ADL's:  Improving   Cognition: WNL  Sleep:  Number of Hours: 3     Current Medications: Current Facility-Administered Medications  Medication Dose Route Frequency Provider Last Rate Last Dose  . acetaminophen (TYLENOL) tablet 650 mg  650 mg Oral Q6H PRN Nicholaus Bloom, MD      . alum & mag hydroxide-simeth (MAALOX/MYLANTA) 200-200-20 MG/5ML suspension 30 mL  30 mL Oral Q4H PRN Nicholaus Bloom, MD   30 mL at 06/13/15 1525  . buPROPion (WELLBUTRIN XL) 24 hr tablet 300 mg  300 mg Oral Daily [name], MD   300 mg at 06/18/15 0740  . clonazePAM (KLONOPIN) tablet 2 mg  2 mg Oral QHS [name], MD   2 mg at 06/17/15 2305  . feeding supplement (ENSURE ENLIVE) (ENSURE ENLIVE) liquid 237 mL  237 mL Oral BID BM Benjamine Mola, FNP   237 mL at 06/18/15 0742  . hydrOXYzine (ATARAX/VISTARIL) tablet 50 mg  50 mg Oral TID PRN Benjamine Mola, FNP   50 mg at 06/17/15 1654  . magnesium hydroxide (MILK OF MAGNESIA) suspension 30 mL  30 mL Oral Daily PRN Nicholaus Bloom, MD      . mirtazapine (REMERON) tablet 15 mg  15 mg Oral QHS [name], MD   15 mg at 06/17/15 2305  . SUMAtriptan (IMITREX) tablet 25 mg  25 mg Oral Q12H PRN [name], MD   25 mg at 06/18/15 0740    Lab Results:  No results found for this or any previous visit (from the past 48 hour(s)).  Physical Findings: AIMS: Facial and Oral Movements Muscles of Facial Expression: None, normal Lips and Perioral Area: None, normal Jaw: None, normal Tongue: None, normal,Extremity Movements Upper (arms, wrists, hands, fingers): None,  normal Lower (legs, knees, ankles, toes): None, normal, Trunk Movements Neck, shoulders, hips: None, normal, Overall Severity Severity of abnormal movements (highest score from questions above): None, normal Incapacitation due to abnormal movements: None, normal Patient's awareness of abnormal movements (rate only patient's report): No Awareness, Dental Status Current problems with teeth and/or dentures?: No Does patient usually wear dentures?: No  CIWA:  CIWA-Ar Total: 2 COWS:  COWS Total Score: 4   Assessment -   She is less depressed . Mood improving, and affect more reactive. She does continue to ruminate about her stressors, but states  mother is doing better, which is a relief  For her. Suicidal ideations are chronic, intermittent, and patient states she finds comfort in thinking about suicide as " a way out if things get really bad". At this time she speaks of suicide as a rational decision making process but identifies mother's improvement and how hurting self would cause suffering to her mother as protective factors .  Anorexia may be related to Wellbutrin - weight today 127 lbs, BMI 23. She remains very reluctant to stop Wellbutrin, states " I am doing much better on it, I don't want to stop it".      Treatment Plan Summary: Daily contact with patient to assess and evaluate symptoms and progress in treatment, Medication management, Plan continue inpatient treatment and continue medications as below   Continue Wellbutrin XL   300  mgrs QAM to address depression Monitor weight daily, to monitor report of poor appetite Check BMP in AM , as has been eating poorly .  Continue  Klonopin  2 mgrs QHS to address insomnia/night time anxiety Continue  Remeron  15 mgrs QHS for depression/insomnia Imitrex PRNs for Migraine if needed  Treatment team working on disposition plans /options Medical Decision Making:  Established Problem, Stable/Improving (1), Review of Psycho-Social Stressors (1),  Review or order clinical lab tests (1) and Review of Medication Regimen & Side Effects (2)     Natasha [name] 06/18/2015, 6:04 PM

## 2015-06-18 NOTE — Clinical Social Work Note (Signed)
CSW spoke with patient's mother Pam Konczal, [phone] to update on patient's progDesma Maximress and discharge plans. Mother is trying to make follow up appointment for patient (fee required to schedule appointment). CSW will continue to follow.  Samuella BruinKristin Misako Roeder, MSW, [employer] Clinical Social Worker [facility] [phone]

## 2015-06-18 NOTE — Progress Notes (Addendum)
Pt states she has no appetite and will not eat or drink. Pt was instructed she must drink and was given a pitcher of gatorade. She insist that she is not trying to starve herself. NP made aware and a dietary consult was ordered. Pt is at times unsteady on her feet and uses a crutch to assist with ambulation.She does contract for safety but did state she has passive SI . Pt plans to live with her mom upon discharge. She has been in the dayroom with the other pts this am. Her affect is blunted and flat. Pt was given imtrex for the new onset of a migraine headache. She rated the pain a 4/10. 10:30a-head pain is gone. 11:30am- pt stated she was always told by her dad who was a doctor that you can never fail.Pt stated if she tired to kill herself again she will not fail. Her two plans are to check into a hotel and either take an overdose of pills which she has researched lethal doses or bleed out. Pt did contract for safety. Dr. Jama Flavorsobos made aware. Pt stated ,"I can not fail again since I already failed once. " My mom is okay at home alone and part of me thinks she still needs my help." "I have had ECT in the past which helped me a lot. " Pt did say [place] and work in [employer] or become a professor. She was encouraged to be in the dayroom with the other pts. 2p-Pt has been encouraged to continue to push po fluids. She did not go to lunch today. NP aware. Pt shared that when younger she escaped,"the perfect family by riding horses and entering in competitions." The pt is still very close to her riding instructor who resides in [place]. She also has a very good friend that would like for her to go cross country on a train ride.The pt would like to join her friend but has guilt feelings that she should stay and care for her mom. Pt stated her dad was a doctor in [place] and was very mean and had a dark side. Her mom was a physical therapist. The pt stated her whole life has resolved around  always succeeding and never failing. She stated even though she has passive thoughts of SI she is always afraid she will fail in another Suicide attempt. 6:30p- Pt was given 50mg  of visteral for her nerves.

## 2015-06-18 NOTE — Progress Notes (Signed)
D: Pt continues to verbalize severe depression and anxiety.  Pt denies SI/HI; it however took a moment for PT to say "NO" to SI.  Pt was able to contract for safety.  Pt states that she felt better until after a phone call with her mom; she states, "I felt a little better earlier today until I spoke to my mom on the phone; she was really mean to me. I which I had never talked to her." Pt still thinks her mom needs her and may stick around for her.  A: Medications administered as prescribed.  Support, encouragement, and safe environment provided.  15-minute safety checks continue. R: Pt was med compliant.  Will continually monitor Pt for safety.

## 2015-06-18 NOTE — [facility] Group Notes (Signed)
   [facility]  06/18/2015  8:45 AM   Participation Quality: Alert, Appropriate and Oriented  Mood/Affect: Appropriate  Depression Rating: 5  Anxiety Rating: Patient reports anxiety that is higher than 5  Thoughts of Suicide: Pt endorses passive SI, can contract for safety  Will you contract for safety? Yes  Current AVH: Pt denies  Plan for Discharge/Comments: Pt attended discharge planning group and actively participated in group. CSW provided pt with today's workbook. Patient plans to return home to follow up with outpatient services- patient's mother is supposed to be assisting in paying deposit for upcoming outpatient appointment.  Transportation Means: Pt reports access to transportation  Supports: No supports mentioned at this time  Samuella BruinKristin Mavis Gravelle, MSW, [employer] Clinical Social Worker [facility] [phone]

## 2015-06-19 ENCOUNTER — Other Ambulatory Visit: Payer: Self-pay

## 2015-06-19 DIAGNOSIS — F332 Major depressive disorder, recurrent severe without psychotic features: Secondary | ICD-10-CM | POA: Diagnosis present

## 2015-06-19 LAB — BASIC METABOLIC PANEL
Anion gap: 12 (ref 5–15)
BUN: 6 mg/dL (ref 6–20)
CO2: 19 mmol/L — ABNORMAL LOW (ref 22–32)
Calcium: 8.9 mg/dL (ref 8.9–10.3)
Chloride: 105 mmol/L (ref 101–111)
Creatinine, Ser: 0.67 mg/dL (ref 0.44–1.00)
GFR calc Af Amer: >60 mL/min (ref >60)
GFR calc non Af Amer: >60 mL/min (ref >60)
Glucose, Bld: 84 mg/dL (ref 65–99)
Potassium: 3.4 mmol/L — ABNORMAL LOW (ref 3.5–5.1)
Sodium: 136 mmol/L (ref 135–145)

## 2015-06-19 LAB — URINE MICROSCOPIC-ADD ON

## 2015-06-19 LAB — URINALYSIS, ROUTINE W REFLEX MICROSCOPIC
Glucose, UA: NEGATIVE mg/dL
Ketones, ur: >80 mg/dL — AB
Nitrite: NEGATIVE
Protein, ur: NEGATIVE mg/dL
Specific Gravity, Urine: 1.015 (ref 1.005–1.030)
Urobilinogen, UA: 1 mg/dL (ref 0.0–1.0)
pH: 5.5 (ref 5.0–8.0)

## 2015-06-19 MED ORDER — BUPROPION HCL ER (XL) 150 MG PO TB24
150.0000 mg | ORAL_TABLET | Freq: Every day | ORAL | Status: DC
  Administered 2015-06-20 – 2015-06-25 (×6): 150 mg via ORAL
  Filled 2015-06-19 (×2): qty 1
  Filled 2015-06-19: qty 3
  Filled 2015-06-19 (×7): qty 1

## 2015-06-19 MED ORDER — OLANZAPINE 5 MG PO TBDP
5.0000 mg | ORAL_TABLET | Freq: Every day | ORAL | Status: DC
  Administered 2015-06-19 – 2015-06-24 (×6): 5 mg via ORAL
  Filled 2015-06-19 (×7): qty 1
  Filled 2015-06-19: qty 3

## 2015-06-19 MED ORDER — LITHIUM CARBONATE 150 MG PO CAPS
150.0000 mg | ORAL_CAPSULE | Freq: Two times a day (BID) | ORAL | Status: DC
Start: 2015-06-19 — End: 2015-06-22
  Administered 2015-06-19 – 2015-06-22 (×6): 150 mg via ORAL
  Filled 2015-06-19 (×11): qty 1

## 2015-06-19 NOTE — Progress Notes (Signed)
D: Patient continues to endorse SI.  She is able to contract for safety, however, cannot contract upon discharge.  She rates her depression as a 5; hopelessness a 5/6; anxiety is a 9.  She presents with flat, depressed and sad affect.  She attended group and stated, "I'm going to go over some things with Dr. Jama Flavorsobos.  I'm make a deal with him; it's very scary. I might want my medications changed."  Her appetite remains poor stating it is "zero".  She refuses any ensure and did not go to breakfast this morning.  She complains of poor sleep.  A: Continue to monitor medication management and MD orders.  Safety checks completed every 15 minutes per protocol.  Offer encouragement and support when needed. R: Patient has been interacting with staff and peers.

## 2015-06-19 NOTE — Progress Notes (Signed)
Pt attended karaoke group this evening.  

## 2015-06-19 NOTE — Progress Notes (Signed)
[facility] MD Progress Note  06/19/2015 10:30 PM Natasha [name]  MRN:  [id number] Subjective:  Natasha [name] endorses persistent depression with suicidal ideas. States the Remeron is not helping for sleep. States she goes to bed thinking ruminating having all those negative thought. She is more agitated and does not have an appetite since started on the Wellbutrin but does not want to give up on it Principal Problem: MDD (major depressive disorder) Diagnosis:   Patient Active Problem List   Diagnosis Date Noted  . Major depressive disorder, single episode, severe without psychotic features [F32.2]   . MDD (major depressive disorder) [F32.2] 06/09/2015   Total Time spent with patient: 30 minutes   Past Medical History:  Past Medical History  Diagnosis Date  . C6 spinal cord injury   . Depression     Past Surgical History  Procedure Laterality Date  . Back surgery     Family History: History reviewed. No pertinent family history. Social History:  History  Alcohol Use No     History  Drug Use No    History   Social History  . Marital Status: Single    Spouse Name: N/A  . Number of Children: N/A  . Years of Education: N/A   Social History Main Topics  . Smoking status: Never Smoker   . Smokeless tobacco: Not on file  . Alcohol Use: No  . Drug Use: No  . Sexual Activity: Not on file   Other Topics Concern  . None   Social History Narrative   Additional History:    Sleep: Poor  Appetite:  Poor   Assessment:   Musculoskeletal: Strength & Muscle Tone: within normal limits Gait & Station: uses braces to help herself  Patient leans: Left   Psychiatric Specialty Exam: Physical Exam  Review of Systems  Constitutional: Positive for malaise/fatigue.  Eyes: Negative.   Respiratory: Negative.   Cardiovascular: Negative.   Gastrointestinal: Negative.   Genitourinary: Negative.   Musculoskeletal: Negative.   Skin: Negative.   Neurological: Positive for weakness and  headaches.  Endo/Heme/Allergies: Negative.   Psychiatric/Behavioral: Positive for depression and suicidal ideas. The patient is nervous/anxious and has insomnia.     Blood pressure 108/91, pulse 117, temperature 98.3 F (36.8 C), temperature source Oral, resp. rate 18, height $RemoveBe'5\' 2"'xNrjRTQdo$  (1.575 m), weight 57.698 kg (127 lb 3.2 oz), last menstrual period 06/02/2015, SpO2 98 %.Body mass index is 23.26 kg/(m^2).  General Appearance: Fairly Groomed  Engineer, water::  Fair  Speech:  Clear and Coherent  Volume:  Decreased  Mood:  Anxious and Depressed  Affect:  Depressed  Thought Process:  Coherent and Goal Directed  Orientation:  Full (Time, Place, and Person)  Thought Content:  symptoms events worries concerns  Suicidal Thoughts:  Yes.  without intent/plan  Homicidal Thoughts:  No  Memory:  Immediate;   Fair Recent;   Fair Remote;   Fair  Judgement:  Fair  Insight:  Present  Psychomotor Activity:  Normal  Concentration:  Fair  Recall:  [employer] of Knowledge:Fair  Language: Fair  Akathisia:  No  Handed:  Right  AIMS (if indicated):     Assets:  Desire for Improvement  ADL's:  Intact  Cognition: WNL  Sleep:  Number of Hours: 6     Current Medications: Current Facility-Administered Medications  Medication Dose Route Frequency Provider Last Rate Last Dose  . acetaminophen (TYLENOL) tablet 650 mg  650 mg Oral Q6H PRN Nicholaus Bloom, MD      .  alum & mag hydroxide-simeth (MAALOX/MYLANTA) 200-200-20 MG/5ML suspension 30 mL  30 mL Oral Q4H PRN Nicholaus Bloom, MD   30 mL at 06/13/15 1525  . [START ON 06/20/2015] buPROPion (WELLBUTRIN XL) 24 hr tablet 150 mg  150 mg Oral Daily Nicholaus Bloom, MD      . clonazePAM Bobbye [place]) tablet 2 mg  2 mg Oral QHS [name], MD   2 mg at 06/19/15 2143  . feeding supplement (ENSURE ENLIVE) (ENSURE ENLIVE) liquid 237 mL  237 mL Oral BID BM Benjamine Mola, FNP   237 mL at 06/18/15 0742  . hydrOXYzine (ATARAX/VISTARIL) tablet 50 mg  50 mg Oral TID PRN Benjamine Mola, FNP   50 mg at 06/19/15 0925  . lithium carbonate capsule 150 mg  150 mg Oral BID WC Nicholaus Bloom, MD   150 mg at 06/19/15 1713  . magnesium hydroxide (MILK OF MAGNESIA) suspension 30 mL  30 mL Oral Daily PRN Nicholaus Bloom, MD      . OLANZapine zydis (ZYPREXA) disintegrating tablet 5 mg  5 mg Oral QHS Nicholaus Bloom, MD   5 mg at 06/19/15 2142  . SUMAtriptan (IMITREX) tablet 25 mg  25 mg Oral Q12H PRN [name], MD   25 mg at 06/19/15 0754    Lab Results:  Results for orders placed or performed during the hospital encounter of 06/09/15 (from the past 48 hour(s))  Basic metabolic panel     Status: Abnormal   Collection Time: 06/19/15  6:13 AM  Result Value Ref Range   Sodium 136 135 - 145 mmol/L   Potassium 3.4 (L) 3.5 - 5.1 mmol/L   Chloride 105 101 - 111 mmol/L   CO2 19 (L) 22 - 32 mmol/L   Glucose, Bld 84 65 - 99 mg/dL   BUN 6 6 - 20 mg/dL   Creatinine, Ser 0.67 0.44 - 1.00 mg/dL   Calcium 8.9 8.9 - 10.3 mg/dL   GFR calc non Af Amer >60 >60 mL/min   GFR calc Af Amer >60 >60 mL/min    Comment: (NOTE) The eGFR has been calculated using the CKD EPI equation. This calculation has not been validated in all clinical situations. eGFR's persistently <60 mL/min signify possible Chronic Kidney Disease.    Anion gap 12 5 - 15    Comment: Performed at [facility]  Urinalysis, Routine w reflex microscopic (not at [facility])     Status: Abnormal   Collection Time: 06/19/15  3:32 PM  Result Value Ref Range   Color, Urine YELLOW YELLOW   APPearance CLOUDY (A) CLEAR   Specific Gravity, Urine 1.015 1.005 - 1.030   pH 5.5 5.0 - 8.0   Glucose, UA NEGATIVE NEGATIVE mg/dL   Hgb urine dipstick MODERATE (A) NEGATIVE   Bilirubin Urine SMALL (A) NEGATIVE   Ketones, ur >80 (A) NEGATIVE mg/dL   Protein, ur NEGATIVE NEGATIVE mg/dL   Urobilinogen, UA 1.0 0.0 - 1.0 mg/dL   Nitrite NEGATIVE NEGATIVE   Leukocytes, UA SMALL (A) NEGATIVE    Comment: Performed at [facility]  Urine microscopic-add on     Status: Abnormal   Collection Time: 06/19/15  3:32 PM  Result Value Ref Range   Squamous Epithelial / LPF MANY (A) RARE   WBC, UA 3-6 <3 WBC/hpf   RBC / HPF 0-2 <3 RBC/hpf   Bacteria, UA MANY (A) RARE    Comment: Performed at [facility]    Physical Findings: AIMS: Facial and Oral Movements Muscles of Facial Expression: None, normal Lips and Perioral Area: None, normal Jaw: None, normal Tongue: None, normal,Extremity Movements Upper (arms, wrists, hands, fingers): None, normal Lower (legs, knees, ankles, toes): None, normal, Trunk Movements Neck, shoulders, hips: None, normal, Overall Severity Severity of abnormal movements (highest score from questions above): None, normal Incapacitation due to abnormal movements: None, normal Patient's awareness of abnormal movements (rate only patient's report): No Awareness, Dental Status Current problems with teeth and/or dentures?: No Does patient usually wear dentures?: No  CIWA:  CIWA-Ar Total: 2 COWS:  COWS Total Score: 4  Treatment Plan Summary: Daily contact with patient to assess and evaluate symptoms and progress in treatment and Medication management Supportive approach/coping skills Depression; will continue the Wellbutrin but will decrease back to 150 mg and ease her into taking the 300 mg Insomnia, ruminative thinking; will use Zyprexa Zydis at night hoping it will help sleep as well as the ruminative thinking and could augment the Wellbutrin Suicidal ideas; will  Introduce lithium as an agent that can protect against suicide, 150 mg BID Work with CBT/mindfulness  Medical Decision Making:  Review of Psycho-Social Stressors (1), Review of Medication Regimen & Side Effects (2) and Review of New Medication or Change in Dosage (2)     Legrand Lasser A 06/19/2015, 10:30 PM

## 2015-06-19 NOTE — [facility] Group Notes (Signed)
[facility] LCSW Group Therapy 06/19/2015 1:15 PM Type of Therapy: Group Therapy Participation Level: Active  Participation Quality: Attentive, Sharing and Supportive  Affect: Appropriate  Cognitive: Alert and Oriented  Insight: Developing/Improving and Engaged  Engagement in Therapy: Developing/Improving and Engaged  Modes of Intervention: Activity, Clarification, Confrontation, Discussion, Education, Exploration, Limit-setting, Orientation, Problem-solving, Rapport Building, Dance movement psychotherapisteality Testing, Socialization and Support  Summary of Progress/Problems: Patient was attentive and engaged with speaker from [employer]. Patient was attentive to speaker while they shared their story of dealing with mental health and overcoming it. Patient expressed interest in their programs and services and received information on their agency. Patient processed ways they can relate to the speaker.   Natasha BruinKristin Lamoine [name], MSW, [employer]CSWA Clinical Social Worker [facility] [phone]

## 2015-06-19 NOTE — [facility] Group Notes (Signed)
[facility] Group Notes:  (Nursing/MHT/Case Management/Adjunct)  Date:  06/19/2015  Time:  0915  Type of Therapy:  Psychoeducational Skills  Participation Level:  Active  Participation Quality:  Appropriate  Affect:  Anxious  Cognitive:  Alert  Insight:  Lacking  Engagement in Group:  Supportive  Modes of Intervention:  Support  Summary of Progress/Problems: Patient states she has some big decisions to make today.  Endorses active SI.  Contracts for safety.  Feels she may have to have her medications changed.  Cranford MonBeaudry, Diantha Paxson Evans 06/19/2015, 11:57 AM

## 2015-06-19 NOTE — Progress Notes (Signed)
D: Pt in the dayroom withdrawn to self. Patient states she feels better today. She however continues to complain of severe depression with vague SI, and mild anxiety; Pt was however able to contract for safety. Pt is also worried about starting a new antidepressant; she states, "I hope it wouldn't make my depression worse."  Pt is alert and oriented x 4.  A: Medications administered as prescribed.  Support, encouragement and safe environment provided. 15-minute safety checks continue. R: Pt was med compliant. Safety checks continue.

## 2015-06-19 NOTE — Clinical Social Work Note (Signed)
[facility], demographics given, under review.  Rn notified that facility will require UA and EKG to review patient and place on wait list.  Santa GeneraAnne Cunningham, LCSW Clinical Social Worker

## 2015-06-19 NOTE — Progress Notes (Addendum)
EKG was done(3pm). May, NP made aware pt is still not eating or drinking.  The pt was confronted and denies that she is trying to starve herself however she continues to refuse to eat or drink. 3pm-pt was given a pitcher of gatorade to drink and will be monitored closely. 5:20p-Pt refused to go to the cafeteria this pm and refused to have food brought back./ pt still has a pitcher of gatorade and was given some gingerale. Pt contracted with the nurse that she will start going to the cafeteria tomorrow at breakfast. NP and MD aware pt is still not eating. (5:20pm)Pt stated,"I am not doing this on purpose but I have no appetite. "Nurse monitored pt to make sure she was not cheeking her medications.NP aware of pts potassium level. 7p-Report to Wm. Wrigley Jr. CompanySandra [name].

## 2015-06-19 NOTE — Progress Notes (Signed)
NUTRITION NOTE  Pt seen for consult for poor appetite with meal refusal, refusal to drink Ensure, and depression with 7 lb weight loss since admit 10 days ago.  Pt reports that she has struggled with depression for a long time and that she was previously on medication which caused her not to feel hungry. She states that at first that is what occurred in this instance but that it has progressed to her feeling an aversion to food. She denies nausea or abdominal pain associated with these feelings. Rather, she simply is turned off by the thought of eating and cannot make herself do it at this time. She states that this is not intentional and discloses that she has intentionally restricted in the past and that she is not currently.  She is not concerned by weight loss since admission and states that her energy level, considering she has severe depression, has been relatively stable. She has been able to do better with fluids and encouraged her to drink fluids when they are provided. She states she can only drink ~1 cup before becoming full.  Pt indicates that her MD has talked with her about switching medications and she was initially reluctant but is now considering it. Encouraged her to talk with MD about this and that a switch could help with appetite although it may take a few days to notice a difference.  Pt appreciative and does not have additional questions at this time.    Trenton GammonJessica Tyrae Alcoser, RD, LDN Inpatient Clinical Dietitian Pager # [phone] After hours/weekend pager # [phone]

## 2015-06-19 NOTE — Progress Notes (Signed)
Patient ID: Natasha SartoriusJenifer K [name], female   DOB: [birth year], 44 y.o.   MRN: [id number] D: Client in bed reports depression "6" of 10, "Dr. Dub MikesLugo changed my medication, think I might be on the right track now" "thinking about getting home" "always have suicidal thoughts" Client contracts for safety, denies plan. A: Writer provided emotional support, encouraged food, drink, and group. Staff will monitor q6315min for safety R: Client is safe on the unit. "I think I might be at the point where I could eat some crackers" Client ate two bags of pretzels and drank cup of Gatorade.

## 2015-06-19 NOTE — Clinical Social Work Note (Signed)
Per Vivien RossettiBarbara Davis, [facility]  Santa GeneraAnne Hykeem Ojeda, LCSW Clinical Social Worker

## 2015-06-20 MED ORDER — LORAZEPAM 1 MG PO TABS
1.0000 mg | ORAL_TABLET | Freq: Once | ORAL | Status: AC
  Administered 2015-06-20: 1 mg via ORAL

## 2015-06-20 MED ORDER — QUETIAPINE FUMARATE 200 MG PO TABS
200.0000 mg | ORAL_TABLET | Freq: Once | ORAL | Status: DC
  Filled 2015-06-20 (×2): qty 1

## 2015-06-20 MED ORDER — LORAZEPAM 1 MG PO TABS
ORAL_TABLET | ORAL | Status: AC
  Filled 2015-06-20: qty 1

## 2015-06-20 NOTE — Progress Notes (Signed)
D- Patient is depressed.  Her depression increased following a phone call she had.  Patient did not disclose information about the call, however she continuously referenced the call throughout the day (ie "the day started off rough especially after the call").  Patient is passive SI.  Patient reports that she has suicidal thoughts which increased after she got off the phone.  Patient states "I would never hurt myself here.  It's when I get home that I'm worried about".  Patient did not go to lunch but drank about 80% of an Ensure.  She expressed excitement about drinking that amount and stated that she would have finished the remainder but got full. On patient's Self-inventory sheet, she reports low energy and on a scale from 0-10 with "10" being the worst, she rated depression a "4", feelings of hopelessness a "6", and anxiety a "9".  Patient got tearful during the shift and expressed that she was not happy with herself but continues to contract for safety. A- Support and encouragement provided.  Routine safety checks conducted every 15 minutes.  Patient informed to notify staff with problems or concerns. R- Patient contracts for safety at this time. Safety maintained on the unit.

## 2015-06-20 NOTE — Progress Notes (Signed)
Patient ID: Natasha [name], female   DOB: Apr 11, 1971, 44 y.o.   MRN: [id number] [facility] MD Progress Note  06/20/2015 3:08 PM JALIA ZUNIGA  MRN:  [id number] Subjective:   Patient states "I feel a bit better since my medications were changed. I am really nervous about going back to the same situation. I was surprised that I rated my depression at four today. I am always pessimistic that it won't last. Of course after I talked to my mother it got a little worse. I am eating a bit better. I did not eat for nine days. The Ensure is good for me."  Subjective:  Patient is seen and chart is reviewed. She is visible on the unit and attending groups. Patient is compliant with medication regimen with no adverse effects reported. The patient reports some slight improvement in her depressive symptoms. She endorses some suicidal thoughts especially when getting anxious about going home. Denies having a specific plan. Her appetite is reported to be improving since the Wellbutrin was decreased stating "I drank an Ensure and ate some pretzels." [name] continues to ruminate about her symptoms worsening after she leaves the hospital. Patient wants Dr. Parke Poisson to set a discharge date because "I am a planner. I need to know when things will happen." Patient denies that doing this would makes her anxiety worse but stated "I can't live here in the hospital."   Principal Problem: Severe recurrent major depression without psychotic features Diagnosis:   Patient Active Problem List   Diagnosis Date Noted  . Severe recurrent major depression without psychotic features [F33.2] 06/19/2015   Total Time spent with patient: 30 minutes   Past Medical History:  Past Medical History  Diagnosis Date  . C6 spinal cord injury   . Depression     Past Surgical History  Procedure Laterality Date  . Back surgery     Family History: History reviewed. No pertinent family history. Social History:  History  Alcohol Use No      History  Drug Use No    History   Social History  . Marital Status: Single    Spouse Name: N/A  . Number of Children: N/A  . Years of Education: N/A   Social History Main Topics  . Smoking status: Never Smoker   . Smokeless tobacco: Not on file  . Alcohol Use: No  . Drug Use: No  . Sexual Activity: Not on file   Other Topics Concern  . None   Social History Narrative   Additional History:    Sleep: Fair  Appetite:  Fair  Assessment:   Musculoskeletal: Strength & Muscle Tone: within normal limits Gait & Station: uses braces to help herself  Patient leans: Left  Psychiatric Specialty Exam: Physical Exam  Review of Systems  Eyes: Negative.   Respiratory: Negative.   Cardiovascular: Negative.   Gastrointestinal: Negative.   Genitourinary: Negative.   Musculoskeletal: Negative.   Skin: Negative.   Neurological: Positive for weakness and headaches.  Endo/Heme/Allergies: Negative.   Psychiatric/Behavioral: Positive for depression and suicidal ideas. Negative for hallucinations, memory loss and substance abuse. The patient is nervous/anxious and has insomnia.     Blood pressure 103/70, pulse 102, temperature 98.3 F (36.8 C), temperature source Oral, resp. rate 16, height 5' 2"  (1.575 m), weight 57.698 kg (127 lb 3.2 oz), last menstrual period 06/02/2015, SpO2 98 %.Body mass index is 23.26 kg/(m^2).  General Appearance: Fairly Groomed  Engineer, water::  Fair  Speech:  Clear and Coherent  Volume:  Normal  Mood:  Anxious and Depressed  Affect:  Depressed  Thought Process:  Coherent and Goal Directed  Orientation:  Full (Time, Place, and Person)  Thought Content:  symptoms events worries concerns  Suicidal Thoughts:  Yes.  without intent/plan  Homicidal Thoughts:  No  Memory:  Immediate;   Fair Recent;   Fair Remote;   Fair  Judgement:  Fair  Insight:  Present  Psychomotor Activity:  Normal  Concentration:  Fair  Recall:  [employer] of Knowledge:Fair   Language: Fair  Akathisia:  No  Handed:  Right  AIMS (if indicated):     Assets:  Communication Skills Desire for Improvement Leisure Time Resilience  ADL's:  Intact  Cognition: WNL  Sleep:  Number of Hours: 4.75   Current Medications: Current Facility-Administered Medications  Medication Dose Route Frequency Provider Last Rate Last Dose  . acetaminophen (TYLENOL) tablet 650 mg  650 mg Oral Q6H PRN Nicholaus Bloom, MD      . alum & mag hydroxide-simeth (MAALOX/MYLANTA) 200-200-20 MG/5ML suspension 30 mL  30 mL Oral Q4H PRN Nicholaus Bloom, MD   30 mL at 06/13/15 1525  . buPROPion (WELLBUTRIN XL) 24 hr tablet 150 mg  150 mg Oral Daily Nicholaus Bloom, MD   150 mg at 06/20/15 0747  . clonazePAM (KLONOPIN) tablet 2 mg  2 mg Oral QHS [name], MD   2 mg at 06/19/15 2143  . feeding supplement (ENSURE ENLIVE) (ENSURE ENLIVE) liquid 237 mL  237 mL Oral BID BM Benjamine Mola, FNP   237 mL at 06/20/15 1219  . hydrOXYzine (ATARAX/VISTARIL) tablet 50 mg  50 mg Oral TID PRN Benjamine Mola, FNP   50 mg at 06/20/15 1226  . lithium carbonate capsule 150 mg  150 mg Oral BID WC Nicholaus Bloom, MD   150 mg at 06/20/15 0749  . magnesium hydroxide (MILK OF MAGNESIA) suspension 30 mL  30 mL Oral Daily PRN Nicholaus Bloom, MD      . OLANZapine zydis (ZYPREXA) disintegrating tablet 5 mg  5 mg Oral QHS Nicholaus Bloom, MD   5 mg at 06/19/15 2142  . QUEtiapine (SEROQUEL) tablet 200 mg  200 mg Oral Once Harriet Butte, NP   200 mg at 06/20/15 0045  . SUMAtriptan (IMITREX) tablet 25 mg  25 mg Oral Q12H PRN [name], MD   25 mg at 06/20/15 0807    Lab Results:  Results for orders placed or performed during the hospital encounter of 06/09/15 (from the past 48 hour(s))  Basic metabolic panel     Status: Abnormal   Collection Time: 06/19/15  6:13 AM  Result Value Ref Range   Sodium 136 135 - 145 mmol/L   Potassium 3.4 (L) 3.5 - 5.1 mmol/L   Chloride 105 101 - 111 mmol/L   CO2 19 (L) 22 - 32 mmol/L    Glucose, Bld 84 65 - 99 mg/dL   BUN 6 6 - 20 mg/dL   Creatinine, Ser 0.67 0.44 - 1.00 mg/dL   Calcium 8.9 8.9 - 10.3 mg/dL   GFR calc non Af Amer >60 >60 mL/min   GFR calc Af Amer >60 >60 mL/min    Comment: (NOTE) The eGFR has been calculated using the CKD EPI equation. This calculation has not been validated in all clinical situations. eGFR's persistently <60 mL/min signify possible Chronic Kidney Disease.    Anion gap 12 5 - 15  Comment: Performed at [facility]  Urinalysis, Routine w reflex microscopic (not at [facility])     Status: Abnormal   Collection Time: 06/19/15  3:32 PM  Result Value Ref Range   Color, Urine YELLOW YELLOW   APPearance CLOUDY (A) CLEAR   Specific Gravity, Urine 1.015 1.005 - 1.030   pH 5.5 5.0 - 8.0   Glucose, UA NEGATIVE NEGATIVE mg/dL   Hgb urine dipstick MODERATE (A) NEGATIVE   Bilirubin Urine SMALL (A) NEGATIVE   Ketones, ur >80 (A) NEGATIVE mg/dL   Protein, ur NEGATIVE NEGATIVE mg/dL   Urobilinogen, UA 1.0 0.0 - 1.0 mg/dL   Nitrite NEGATIVE NEGATIVE   Leukocytes, UA SMALL (A) NEGATIVE    Comment: Performed at [facility]  Urine microscopic-add on     Status: Abnormal   Collection Time: 06/19/15  3:32 PM  Result Value Ref Range   Squamous Epithelial / LPF MANY (A) RARE   WBC, UA 3-6 <3 WBC/hpf   RBC / HPF 0-2 <3 RBC/hpf   Bacteria, UA MANY (A) RARE    Comment: Performed at [facility]    Physical Findings: AIMS: Facial and Oral Movements Muscles of Facial Expression: None, normal Lips and Perioral Area: None, normal Jaw: None, normal Tongue: None, normal,Extremity Movements Upper (arms, wrists, hands, fingers): None, normal Lower (legs, knees, ankles, toes): None, normal, Trunk Movements Neck, shoulders, hips: None, normal, Overall Severity Severity of abnormal movements (highest score from questions above): None, normal Incapacitation due to abnormal movements: None,  normal Patient's awareness of abnormal movements (rate only patient's report): No Awareness, Dental Status Current problems with teeth and/or dentures?: No Does patient usually wear dentures?: No  CIWA:  CIWA-Ar Total: 2 COWS:  COWS Total Score: 4  Treatment Plan Summary: Daily contact with patient to assess and evaluate symptoms and progress in treatment and Medication management Supportive approach/coping skills Depression; will continue the Wellbutrin at 150 mg for depression Insomnia, ruminative thinking; will use Zyprexa Zydis 5 mg at hs Suicidal ideas; will continue lithium as an agent that can protect against suicide, 150 mg BID Continue Klonopin 2 mg at hs for insomnia/anxiety Work with CBT/mindfulness  Medical Decision Making:  Review of Psycho-Social Stressors (1), Review of Medication Regimen & Side Effects (2) and Review of New Medication or Change in Dosage (2)  DAVIS, LAURA, NP-C 06/20/2015, 3:08 PM I agree with assessment and plan Geralyn Flash A. Sabra Heck, M.D.

## 2015-06-20 NOTE — Clinical Social Work Note (Signed)
CSW provided patient with information on Al-An[employer] & income based housing options.  Samuella BruinKristin Terissa Haffey, MSW, [employer]CSWA Clinical Social Worker [facility] [phone]

## 2015-06-20 NOTE — Progress Notes (Signed)
[facility] Group Notes:  (Nursing/MHT/Case Management/Adjunct)  Date:  06/20/2015  Time:  11:26 PM  Type of Therapy:  Group Therapy  Participation Level:  Minimal  Participation Quality:  Appropriate  Affect:  Flat  Cognitive:  Alert and Appropriate  Insight:  Improving  Engagement in Group:  Developing/Improving  Modes of Intervention:  Socialization and Support  Summary of Progress/Problems: Pt. Participated in group discussion and insight improve as the discussion progressed.  Pt. Stated her day was uneasy but used her support system and talk with staff.  Sondra ComeWilson, Amory Simonetti J 06/20/2015, 11:26 PM

## 2015-06-20 NOTE — [facility] Group Notes (Signed)
   [facility]  06/20/2015  8:45 AM   Participation Quality: Alert, Appropriate and Oriented  Mood/Affect: Appropriate  Depression Rating: 4  Anxiety Rating: 9  Thoughts of Suicide: Pt endorses passive SI but contracts for safety  Will you contract for safety? Yes  Current AVH: Pt denies  Plan for Discharge/Comments: Pt attended discharge planning group and actively participated in group. CSW provided pt with today's workbook. Patient plans to return home to follow up with outpatient services at [facility].  Transportation Means: Pt reports access to transportation  Supports: No supports mentioned at this time  Natasha [name], MSW, [employer]CSWA Clinical Social Worker [facility] [phone]

## 2015-06-20 NOTE — [facility] Group Notes (Signed)
[facility] LCSW Group Therapy 06/20/2015 1:15 PM Type of Therapy: Group Therapy Participation Level: Active  Participation Quality: Attentive, Sharing and Supportive  Affect: Depressed and Flat  Cognitive: Alert and Oriented  Insight: Developing/Improving and Engaged  Engagement in Therapy: Developing/Improving and Engaged  Modes of Intervention: Clarification, Confrontation, Discussion, Education, Exploration, Limit-setting, Orientation, Problem-solving, Rapport Building, Dance movement psychotherapisteality Testing, Socialization and Support  Summary of Progress/Problems: The topic for today was feelings about relapse. Pt discussed what relapse prevention is to them and identified triggers that they are on the path to relapse. Pt processed their feeling towards relapse and was able to relate to peers. Pt discussed coping skills that can be used for relapse prevention. Patient identified suicidal thoughts and behaviors as her relapse behaviors. She discussed feeling "stuck" in her current living environment and feeling very hopeless about her future. CSW challenged patient's statement that she has no alternative choices and provided emotional support and encouragement.   Natasha BruinKristin Kynleigh [name], MSW, [employer]CSWA Clinical Social Worker [facility] [phone]
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
## 2015-06-21 ENCOUNTER — Encounter (HOSPITAL_COMMUNITY): Payer: Self-pay | Admitting: Registered Nurse

## 2015-06-21 DIAGNOSIS — G4700 Insomnia, unspecified: Secondary | ICD-10-CM

## 2015-06-21 NOTE — Progress Notes (Signed)
D- Patient's mood improved from previous day.  Patient has a brighter affect and is interacting well with her peers.  Patient still refuses to eat and will request a Feeding Supplement in the place of a meal.  Patient consumed 90% of her Ensure today which was an increase from the previous day.  On patient's self-inventory, patient describes her energy low, hopelessness 7, depression 5, and anxiety 9 with 10 being the worst.  Patient endorses passive SI and states but does contract for safety and states that she would never hurt [facility].  Denies HI, AVH, and pain.  A- Scheduled medications administered to patient, per MD orders. Support and encouragement provided.  Routine safety checks conducted every 15 minutes.  Patient informed to notify staff with problems or concerns. R- No adverse drug reactions noted. Patient compliant with medications and treatment plan. Patient remains safe at this time.

## 2015-06-21 NOTE — Progress Notes (Signed)
The focus of this group is to help patients review their daily goal of treatment and discuss progress on daily workbooks. Pt attended the evening group session but responded minimally to discussion prompts from the Writer. Pt shared that today was an okay day and cited catching up on her rest as a positive thing that happened. Pt reported having no additional requests from Nursing Staff this evening. Pt appeared depressed in group. The Writer checked with the Pt following wrap-up and found her to be more talkative. She confessed that she had actually felt more depressed and sad today, though she still contracted for safety. Pt also stated she was missing her cell phone, which ordinarily made her feel more connected to others. Natasha [name] mentioned that she did not attend recreation time in the gym earlier with her peers and was encouraged to do so tomorrow. "I might go." Pt thanked Retail bankerthe Writer for checking on her and agreed to let Nursing Staff know this evening if she felt unsafe or needed anything.

## 2015-06-21 NOTE — [facility] Group Notes (Signed)
[facility] Group Notes:  (Clinical Social Work)  06/21/2015     1:15-2:15PM  Summary of Progress/Problems:   The main focus of today's process group was to learn how to use a decisional balance exercise to move forward in the Stages of Change, which were described and discussed.  Motivational Interviewing and a worksheet plus whiteboard were utilized to help patients explore in depth the perceived benefits and costs of unhealthy coping techniques, as well as the  benefits and costs of replacing that with a healthy coping skills.   The patient expressed herself throughout group and was very involved, but often hopeless about the answers.  She intellectualized a lot of things and acknowledged that she is aware of this.  Type of Therapy:  Group Therapy - Process   Participation Level:  Active  Participation Quality:  Attentive and Sharing  Affect:  Depressed and Flat  Cognitive:  Alert and Appropriate  Insight:  Developing/Improving  Engagement in Therapy:  Engaged  Modes of Intervention:  Education, Motivational Interviewing  Ambrose MantleMareida Grossman-Orr, LCSW 06/21/2015, 4:48 PM

## 2015-06-21 NOTE — Progress Notes (Signed)
Patient ID: Natasha [name], female   DOB: 19-Jul-1971, 44 y.o.   MRN: [id number] [facility] MD Progress Note  06/21/2015 6:04 PM Natasha [name]  MRN:  [id number] Subjective:   Patient states that she feel "pretty good."  Patient states that she is taking Ensure now and that she is sleeping better.   States that she feels like she is improving.    Objective: Patient is seen and chart is reviewed. Patient states that she is attending and participating in group sessions.  Tolerating medications without adverse effects.     Principal Problem: Severe recurrent major depression without psychotic features Diagnosis:   Patient Active Problem List   Diagnosis Date Noted  . Severe recurrent major depression without psychotic features [F33.2] 06/19/2015   Total Time spent with patient: 30 minutes   Past Medical History:  Past Medical History  Diagnosis Date  . C6 spinal cord injury   . Depression     Past Surgical History  Procedure Laterality Date  . Back surgery     Family History: History reviewed. No pertinent family history. Social History:  History  Alcohol Use No     History  Drug Use No    History   Social History  . Marital Status: Single    Spouse Name: N/A  . Number of Children: N/A  . Years of Education: N/A   Social History Main Topics  . Smoking status: Never Smoker   . Smokeless tobacco: Not on file  . Alcohol Use: No  . Drug Use: No  . Sexual Activity: Not on file   Other Topics Concern  . None   Social History Narrative   Additional History:    Sleep: Fair  Appetite:  Fair  Assessment:   Musculoskeletal: Strength & Muscle Tone: within normal limits Gait & Station: uses braces to help herself  Patient leans: Left  Psychiatric Specialty Exam: Physical Exam  Review of Systems  Eyes: Negative.   Respiratory: Negative.   Cardiovascular: Negative.   Gastrointestinal: Negative.   Genitourinary: Negative.   Musculoskeletal: Negative.   Skin:  Negative.   Neurological: Positive for weakness and headaches.  Endo/Heme/Allergies: Negative.   Psychiatric/Behavioral: Positive for depression and suicidal ideas. Negative for hallucinations, memory loss and substance abuse. The patient is nervous/anxious and has insomnia.     Blood pressure 102/75, pulse 96, temperature 98.1 F (36.7 C), temperature source Oral, resp. rate 18, height  (1.575 m), weight 57.698 kg (127 lb 3.2 oz), last menstrual period 06/02/2015, SpO2 98 %.Body mass index is 23.26 kg/(m^2).  General Appearance: Fairly Groomed  Patent attorney::  Fair  Speech:  Clear and Coherent  Volume:  Normal  Mood:  Anxious and Depressed  Affect:  Depressed  Thought Process:  Coherent and Goal Directed  Orientation:  Full (Time, Place, and Person)  Thought Content:  symptoms events worries concerns  Suicidal Thoughts:  Denies at this time  Homicidal Thoughts:  No  Memory:  Immediate;   Fair Recent;   Fair Remote;   Fair  Judgement:  Fair  Insight:  Present  Psychomotor Activity:  Normal  Concentration:  Fair  Recall:  [employer] of Knowledge:Fair  Language: Fair  Akathisia:  No  Handed:  Right  AIMS (if indicated):     Assets:  Communication Skills Desire for Improvement Leisure Time Resilience  ADL's:  Intact  Cognition: WNL  Sleep:  Number of Hours: 6.5   Current Medications: Current Facility-Administered Medications  Medication Dose  Route Frequency Provider Last Rate Last Dose  . acetaminophen (TYLENOL) tablet 650 mg  650 mg Oral Q6H PRN Rachael FeeIrving A Lugo, MD      . alum & mag hydroxide-simeth (MAALOX/MYLANTA) 200-200-20 MG/5ML suspension 30 mL  30 mL Oral Q4H PRN Rachael FeeIrving A Lugo, MD   30 mL at 06/13/15 1525  . buPROPion (WELLBUTRIN XL) 24 hr tablet 150 mg  150 mg Oral Daily Rachael FeeIrving A Lugo, MD   150 mg at 06/21/15 0851  . clonazePAM (KLONOPIN) tablet 2 mg  2 mg Oral QHS Craige CottaFernando A Cobos, MD   2 mg at 06/20/15 2231  . feeding supplement (ENSURE ENLIVE) (ENSURE ENLIVE)  liquid 237 mL  237 mL Oral BID BM Beau FannyJohn C Withrow, FNP   237 mL at 06/21/15 1222  . hydrOXYzine (ATARAX/VISTARIL) tablet 50 mg  50 mg Oral TID PRN Beau FannyJohn C Withrow, FNP   50 mg at 06/20/15 1226  . lithium carbonate capsule 150 mg  150 mg Oral BID WC Rachael FeeIrving A Lugo, MD   150 mg at 06/21/15 [id number]  . magnesium hydroxide (MILK OF MAGNESIA) suspension 30 mL  30 mL Oral Daily PRN Rachael FeeIrving A Lugo, MD      . OLANZapine zydis (ZYPREXA) disintegrating tablet 5 mg  5 mg Oral QHS Rachael FeeIrving A Lugo, MD   5 mg at 06/20/15 2231  . QUEtiapine (SEROQUEL) tablet 200 mg  200 mg Oral Once Worthy FlankIjeoma E Nwaeze, NP   200 mg at 06/20/15 0045  . SUMAtriptan (IMITREX) tablet 25 mg  25 mg Oral Q12H PRN Craige CottaFernando A Cobos, MD   25 mg at 06/20/15 [id number]    Lab Results:  No results found for this or any previous visit (from the past 48 hour(s)).  Physical Findings: AIMS: Facial and Oral Movements Muscles of Facial Expression: None, normal Lips and Perioral Area: None, normal Jaw: None, normal Tongue: None, normal,Extremity Movements Upper (arms, wrists, hands, fingers): None, normal Lower (legs, knees, ankles, toes): None, normal, Trunk Movements Neck, shoulders, hips: None, normal, Overall Severity Severity of abnormal movements (highest score from questions above): None, normal Incapacitation due to abnormal movements: None, normal Patient's awareness of abnormal movements (rate only patient's report): No Awareness, Dental Status Current problems with teeth and/or dentures?: No Does patient usually wear dentures?: No  CIWA:  CIWA-Ar Total: 2 COWS:  COWS Total Score: 4  Treatment Plan Summary: Daily contact with patient to assess and evaluate symptoms and progress in treatment and Medication management Supportive approach/coping skills Depression; will continue the Wellbutrin at 150 mg for depression Insomnia, ruminative thinking; will use Zyprexa Zydis 5 mg at hs Suicidal ideas; will continue lithium as an agent that can protect  against suicide, 150 mg BID Continue Klonopin 2 mg at hs for insomnia/anxiety Work with CBT/mindfulness  Will continue with current treatment plan at this time  Medical Decision Making:  Review of Psycho-Social Stressors (1), Review of Medication Regimen & Side Effects (2) and Review of New Medication or Change in Dosage (2)  Rankin, Shuvon, FNP-BC 06/21/2015, 6:04 PM

## 2015-06-21 NOTE — Progress Notes (Signed)
D: Pt reports that she is feeling relieved after speaking with her physiatrist on Friday Afternoon. She is looking forward to hearing an update about her discharge plans on Monday. Pt reports that her SI remains constant. She continues to struggle with the possibility of acting upon [facility]. Pt is observed as visible and active within the milieu. Pt is negative for any HI/AVH. A: Writer administered scheduled medications to pt, per MD orders. Continued support and availability as needed was extended to this pt. Staff continue to monitor pt with q6915min checks.  R: No adverse drug reactions noted. Pt receptive to treatment. Pt remains safe at this time.

## 2015-06-22 MED ORDER — LITHIUM CARBONATE 300 MG PO CAPS
300.0000 mg | ORAL_CAPSULE | Freq: Two times a day (BID) | ORAL | Status: DC
  Administered 2015-06-22 – 2015-06-25 (×7): 300 mg via ORAL
  Filled 2015-06-22 (×2): qty 1
  Filled 2015-06-22: qty 6
  Filled 2015-06-22 (×3): qty 1
  Filled 2015-06-22: qty 6
  Filled 2015-06-22 (×4): qty 1

## 2015-06-22 MED ORDER — POTASSIUM CHLORIDE CRYS ER 20 MEQ PO TBCR
20.0000 meq | EXTENDED_RELEASE_TABLET | Freq: Two times a day (BID) | ORAL | Status: AC
  Administered 2015-06-22 – 2015-06-24 (×4): 20 meq via ORAL
  Filled 2015-06-22 (×4): qty 1

## 2015-06-22 NOTE — Progress Notes (Signed)
Nursing Progress Note: 7-7p  D- Mood is depressed and anxious,rates anxiety at 8/10. Affect is blunted and appropriate. Pt is able to contract for safety. Reports mood is better in am and gradually gets worst thru out the day.   A - Observed pt interacting in group and in the milieu.Support and encouragement offered, safety maintained with q 15 minutes. Group discussion included future planning pt would like to be less depress and anxious . C/o headache, reports suffering from chronic headaches and [employer] helps. Appetite remains poor, ensure taken.Refused lunch, offered options refused.  R-Contracts for safety and continues to follow treatment plan, working on learning new coping skills.

## 2015-06-22 NOTE — [facility] Group Notes (Signed)
[facility] Group Notes:  (Nursing/MHT/Case Management/Adjunct)  Date:  06/22/2015  Time: 0915 am  Type of Therapy:  Psychoeducational Skills  Participation Level:  Did Not Attend  Cranford MonBeaudry, Raisa Ditto Evans 06/22/2015, 9:35 AM

## 2015-06-22 NOTE — Progress Notes (Signed)
D: Pt reports that her mood did not have that same "boost" that she had on Friday. She is afraid to be discharged with her same feelings of SI. Pt desires to feel better. Despite pt's mood, she still puts effort in participating within the milieu. Pt is currently negative for any HI/AVH.  A: Writer administered scheduled medications to pt, per MD orders. Continued support and availability as needed was extended to this pt. Staff continue to monitor pt with q615min checks.  R: No adverse drug reactions noted. Pt receptive to treatment. Pt verbally contracts for safety.Pt remains safe at this time.

## 2015-06-22 NOTE — [facility] Group Notes (Signed)
[facility] Group Notes:  (Clinical Social Work)  06/22/2015  1:15-2:15PM  Summary of Progress/Problems:   The main focus of today's process group was to   1)  discuss the importance of adding supports  2)  define health supports versus unhealthy supports  3)  identify the patient's current unhealthy supports and plan how to handle them  4)  Identify the patient's current healthy supports and plan what to add.  An emphasis was placed on using counselor, doctor, therapy groups, 12-step groups, and problem-specific support groups to expand supports.    The patient expressed full comprehension of the concepts presented, and agreed that there is a need to add more supports.  The patient stated her horse and 2 dogs are several sources of positive support that she currently uses.  She is interested in others, expressed hope.  She did apologize for herself numerous times throughout group, although there was no true cause for such.  Type of Therapy:  Process Group with Motivational Interviewing  Participation Level:  Active  Participation Quality:  Attentive, Sharing and Supportive  Affect:  Anxious, Blunted and Depressed  Cognitive:  Alert  Insight:  Developing/Improving  Engagement in Therapy:  Engaged  Modes of Intervention:   Education, Support and Processing, Activity  Ambrose MantleMareida Grossman-Orr, LCSW 06/22/2015

## 2015-06-22 NOTE — Progress Notes (Signed)
Patient ID: Natasha [name], female   DOB: 03-29-1971, 44 y.o.   MRN: [id number] [facility] MD Progress Note  06/22/2015 12:45 PM Natasha [name]  MRN:  [id number] Subjective:   Patient states "I feel more depressed. I've been upset over not having a scheduled discharge date. I feel like people are judging me and I worry what people think. But I am truly fighting this. I have been struggling since my 20's. The destructive thoughts come and go. Last night I thought about harming myself but told a tech instead. That is huge for me to do."   Objective: Patient is seen and chart is reviewed. Patient states that she is attending and participating in group sessions.  Tolerating medications without adverse effects. However, she continues to have a very poor appetite. Nursing staff report that the patient has continued to lose weight. Patient reports that she is only drinking two Ensure's per day. She is requesting that her Lithium be increased to help with recurrent suicidal thoughts. Patient educated about how her poor nutrition could be affecting how medications are working. The patient appeared to give this some thoughtful consideration. She agreed to eat lunch today and try to increase her intake of food.  Informed the patient of her recorded weight loss on the flow-sheets.   Principal Problem: Severe recurrent major depression without psychotic features Diagnosis:   Patient Active Problem List   Diagnosis Date Noted  . Severe recurrent major depression without psychotic features [F33.2] 06/19/2015   Total Time spent with patient: 30 minutes   Past Medical History:  Past Medical History  Diagnosis Date  . C6 spinal cord injury   . Depression     Past Surgical History  Procedure Laterality Date  . Back surgery     Family History: History reviewed. No pertinent family history. Social History:  History  Alcohol Use No     History  Drug Use No    History   Social History  . Marital Status:  Single    Spouse Name: N/A  . Number of Children: N/A  . Years of Education: N/A   Social History Main Topics  . Smoking status: Never Smoker   . Smokeless tobacco: Not on file  . Alcohol Use: No  . Drug Use: No  . Sexual Activity: Not on file   Other Topics Concern  . None   Social History Narrative   Additional History:    Sleep: Fair  Appetite:  Fair  Assessment:   Musculoskeletal: Strength & Muscle Tone: within normal limits Gait & Station: uses braces to help herself  Patient leans: Left  Psychiatric Specialty Exam: Physical Exam  Psychiatric: Her speech is normal. She is withdrawn. Cognition and memory are normal. She expresses impulsivity. She exhibits a depressed mood. She expresses suicidal ideation.    Review of Systems  HENT: Negative.   Eyes: Negative.   Respiratory: Negative.   Cardiovascular: Negative.   Gastrointestinal: Negative.   Genitourinary: Negative.   Musculoskeletal: Negative.   Skin: Negative.   Neurological: Negative.   Endo/Heme/Allergies: Negative.   Psychiatric/Behavioral: Positive for depression and suicidal ideas. Negative for hallucinations, memory loss and substance abuse. The patient is nervous/anxious and has insomnia.     Blood pressure 111/83, pulse 110, temperature 98.2 F (36.8 C), temperature source Oral, resp. rate 16, height  (1.575 m), weight 56.7 kg (125 lb), last menstrual period 06/02/2015, SpO2 98 %.Body mass index is 22.86 kg/(m^2).  General Appearance: Fairly Groomed  Eye  Contact::  Fair  Speech:  Clear and Coherent  Volume:  Normal  Mood:  Anxious and Depressed  Affect:  Depressed  Thought Process:  Coherent and Goal Directed  Orientation:  Full (Time, Place, and Person)  Thought Content:  symptoms events worries concerns  Suicidal Thoughts:  Denies at this time  Homicidal Thoughts:  No  Memory:  Immediate;   Fair Recent;   Fair Remote;   Fair  Judgement:  Fair  Insight:  Present  Psychomotor  Activity:  Normal  Concentration:  Fair  Recall:  [employer] of Knowledge:Fair  Language: Fair  Akathisia:  No  Handed:  Right  AIMS (if indicated):     Assets:  Communication Skills Desire for Improvement Leisure Time Resilience  ADL's:  Intact  Cognition: WNL  Sleep:  Number of Hours: 6.5   Current Medications: Current Facility-Administered Medications  Medication Dose Route Frequency Provider Last Rate Last Dose  . acetaminophen (TYLENOL) tablet 650 mg  650 mg Oral Q6H PRN Rachael Fee, MD      . alum & mag hydroxide-simeth (MAALOX/MYLANTA) 200-200-20 MG/5ML suspension 30 mL  30 mL Oral Q4H PRN Rachael Fee, MD   30 mL at 06/13/15 1525  . buPROPion (WELLBUTRIN XL) 24 hr tablet 150 mg  150 mg Oral Daily Rachael Fee, MD   150 mg at 06/22/15 0801  . clonazePAM (KLONOPIN) tablet 2 mg  2 mg Oral QHS Craige Cotta, MD   2 mg at 06/21/15 2256  . feeding supplement (ENSURE ENLIVE) (ENSURE ENLIVE) liquid 237 mL  237 mL Oral BID BM Beau Fanny, FNP   237 mL at 06/22/15 0947  . hydrOXYzine (ATARAX/VISTARIL) tablet 50 mg  50 mg Oral TID PRN Beau Fanny, FNP   50 mg at 06/22/15 0146  . lithium carbonate capsule 300 mg  300 mg Oral BID WC Thermon Leyland, NP      . magnesium hydroxide (MILK OF MAGNESIA) suspension 30 mL  30 mL Oral Daily PRN Rachael Fee, MD      . OLANZapine zydis (ZYPREXA) disintegrating tablet 5 mg  5 mg Oral QHS Rachael Fee, MD   5 mg at 06/21/15 2256  . potassium chloride SA (K-DUR,KLOR-CON) CR tablet 20 mEq  20 mEq Oral BID Thermon Leyland, NP      . QUEtiapine (SEROQUEL) tablet 200 mg  200 mg Oral Once Worthy Flank, NP   200 mg at 06/20/15 0045  . SUMAtriptan (IMITREX) tablet 25 mg  25 mg Oral Q12H PRN Craige Cotta, MD   25 mg at 06/22/15 4098    Lab Results:  No results found for this or any previous visit (from the past 48 hour(s)).  Physical Findings: AIMS: Facial and Oral Movements Muscles of Facial Expression: None, normal Lips and Perioral  Area: None, normal Jaw: None, normal Tongue: None, normal,Extremity Movements Upper (arms, wrists, hands, fingers): None, normal Lower (legs, knees, ankles, toes): None, normal, Trunk Movements Neck, shoulders, hips: None, normal, Overall Severity Severity of abnormal movements (highest score from questions above): None, normal Incapacitation due to abnormal movements: None, normal Patient's awareness of abnormal movements (rate only patient's report): No Awareness, Dental Status Current problems with teeth and/or dentures?: No Does patient usually wear dentures?: No  CIWA:  CIWA-Ar Total: 2 COWS:  COWS Total Score: 4  Treatment Plan Summary: Daily contact with patient to assess and evaluate symptoms and progress in treatment and Medication management Supportive  approach/coping skills Depression; will continue the Wellbutrin at 150 mg for depression Insomnia, ruminative thinking; will continue Zyprexa Zydis 5 mg at hs Suicidal ideas; will increase lithium as an agent that can protect against suicide, 300 mg BID, Lithium level on 06/24/15 Order K-Dur 20 meq times four does for hypokalemia, repeat BMP on 06/24/15  Continue Klonopin 2 mg at hs for insomnia/anxiety Work with CBT/mindfulness Continue to encourage the patient to improve oral intake Will continue with current treatment plan at this time  Medical Decision Making:  Review of Psycho-Social Stressors (1), Review of Medication Regimen & Side Effects (2) and Review of New Medication or Change in Dosage (2)  Felipa Laroche, NP-C 06/22/2015, 12:45 PM

## 2015-06-22 NOTE — Plan of Care (Signed)
Problem: Ineffective individual coping Goal: STG: Patient will remain free from self harm Outcome: Progressing PT and this writer discussed urges to self harm, patient stated she not having any urges.

## 2015-06-22 NOTE — Plan of Care (Signed)
Problem: Diagnosis: Increased Risk For Suicide Attempt Goal: STG-Patient Will Report Suicidal Feelings to Staff Outcome: Progressing Patient and this Clinical research associatewriter discussed seeking help from staff when feeling SI

## 2015-06-22 NOTE — Progress Notes (Signed)
[facility] Group Notes:  (Nursing/MHT/Case Management/Adjunct)  Date:  06/22/2015  Time:  9:24 PM  Type of Therapy:  Psychoeducational Skills  Participation Level:  Active  Participation Quality:  Appropriate  Affect:  Flat  Cognitive:  Appropriate  Insight:  Appropriate  Engagement in Group:  Engaged  Modes of Intervention:  Education  Summary of Progress/Problems: The patient described her day as having been "pretty good" overall. The patient credited her positive day to being able to make it to the cafeteria for meals and because her medication has been changed. She is unclear about her discharge plans but has agreed to talk to the case manager tomorrow morning. As a theme for the day, her relapse prevention will include "staying in treatment" and building up a support system.   Hazle CocaGOODMAN, Stiven Kaspar S 06/22/2015, 9:24 PM

## 2015-06-22 NOTE — Plan of Care (Signed)
Problem: Diagnosis: Increased Risk For Suicide Attempt Goal: LTG-Patient Will Report Improved Mood and Deny Suicidal LTG (by discharge) Patient will report improved mood and deny suicidal ideation.  Outcome: Not Progressing Pt reports feeling like she [facility] . Pt continues to remain with constant SI.

## 2015-06-22 NOTE — Progress Notes (Addendum)
D: PT alert and oriented x 3. Pt denies pain SI/HI/AVH. Pt stated she was a little  Anxious about being discharged home to her mother's home. Advised to talk with her social worker or councilor about the transition. Pt asked for something for a headache 6 out of 10 at 0250. PRN dose of Imitrex  given with effective results.  A: Staff to monitor Q 15 mins for safety. Encouragement and support offered. Scheduled medications administered per orders. R: Patient remains safe on the unit. Patient attended group tonight. Patient visible on hte unit and interacting with peers. Patient taking administered medications.

## 2015-06-23 NOTE — [facility] Group Notes (Signed)
[facility] LCSW Group Therapy  06/23/2015 1:15pm  Type of Therapy:  Group Therapy vercoming Obstacles  Participation Level:  Active  Participation Quality:  Appropriate   Affect:  Appropriate  Cognitive:  Appropriate and Oriented  Insight:  Developing/Improving and Improving  Engagement in Therapy:  Improving  Modes of Intervention:  Discussion, Exploration, Problem-solving and Support  Description of Group:   In this group patients will be encouraged to explore what they see as obstacles to their own wellness and recovery. They will be guided to discuss their thoughts, feelings, and behaviors related to these obstacles. The group will process together ways to cope with barriers, with attention given to specific choices patients can make. Each patient will be challenged to identify changes they are motivated to make in order to overcome their obstacles. This group will be process-oriented, with patients participating in exploration of their own experiences as well as giving and receiving support and challenge from other group members.  Summary of Patient Progress: Pt participated appropriately in group discussion, engaging with other peers by asking questions and challenging thoughts. Pt expressed that she views obstacles as a challenge which often illicit feelings of fear, anger, and weariness. Pt described how she often internalizes those feelings towards the obstacles which increases her depression. Pt reports that she would like to find ways to express those feelings in a healthy manner.   Therapeutic Modalities:   Cognitive Behavioral Therapy Solution Focused Therapy Motivational Interviewing Relapse Prevention Therapy   Chad CordialLauren Carter, LCSWA 06/23/2015 5:27 PM

## 2015-06-23 NOTE — Clinical Social Work Note (Signed)
[facility] called to confirm that patient remains inpatient, [facility].  Santa GeneraAnne Cunningham, LCSW Clinical Social Worker

## 2015-06-23 NOTE — Progress Notes (Signed)
POST FALL NOTE: D: Pt discovered by [facility]HT Mack in the bathroom floor. Reported fall was not witnessed. Pt reports "It's not a big deal, I just twisted around too fast." Pt reports some light headedness prior to reported fall but denies upon RN assessment. Pt states "I hit my eyebrow on the sink." Pt denies pain, reports "stinging," pt unable to rate pain and minimizes experience. Pt does not appear to be in any distress or pain. Pt denies any other pain or injury. Pt is alert and oriented x4. Respirations are even and unlabored. Pt's VS are stable. Skin intact, no bleeding, some reddness noted on right eye brow. Pt refuses intervention. Pt refuses any additional procedures, stating "I don't need a CT or anything like that." No change in ROM post fall.  A: RN offered to contact family, pt refused. NP Anges and NP Charisse MarchLaura D. made aware of occurrence at 1705, verbal orders placed with read back and acknowledged. VS obtained, neuro assessment completed, will reassess/monitor neuro status and VS per providers orders. Pt refused cold pack. Fall precautions/prevention education reviewed with pt. 15 minute checks continued per protocol for patient safety.  R: Patient cooperative and receptive to nursing interventions. Pt refuses cold pack.

## 2015-06-23 NOTE — [facility] Group Notes (Signed)
[facility]  06/23/2015 8:45 AM  Participation Quality: Alert, Appropriate and Oriented  Mood/Affect: Flat Affect, reports improving mood  Depression Rating: 4  Anxiety Rating: 8-9  Thoughts of Suicide: Pt endorses passive SI/HI  Will you contract for safety? Yes  Current AVH: Pt denies  Plan for Discharge/Comments: Pt attended discharge planning group and actively participated in group. CSW discussed suicide prevention education with the group and encouraged them to discuss discharge planning and any relevant barriers. Pt reports that her depression is at a good level today, however feeling anxious due to uncertainty regarding discharge date. [place]  Transportation Means: Pt reports access to transportation  Supports: Mother  Chad CordialLauren Carter, Theresia MajorsLCSWA 06/23/2015 9:28 AM

## 2015-06-23 NOTE — Progress Notes (Signed)
Pt refused to go to dining room to eat, even after this nurse encouraged pt to go. Pt stated "I will drink an ensure, I'm just not hungry." Advised pt that even though she my not be hungry she still needed to intake some food at meals, That our bodies need the food for fuel. Ensure given,  Pt is actively drinking ensure at this time.

## 2015-06-23 NOTE — Progress Notes (Signed)
D: Pt alert and oriented x4. Pt continues on neuro checks for fall at 1700. Neuro checks have been WDL. Pt continues to have passive SI with no plan. No urges to self harm. Patient was able to verbally contract with this Clinical research associatewriter for safety. Pt does have a small reddened area on right eye brow. Pt states area is a little sore. Patient states pain is 2-3 out of 10.   A: Staff to monitor Q 15 mins for safety. Encouragement and support offered. Scheduled medications administered per orders. R: Patient remains safe on the unit. Patient attended group tonight. Patient visible on hte unit and interacting with peers. Patient taking administered medications.

## 2015-06-23 NOTE — Progress Notes (Signed)
Recreation Therapy Notes  Date: 07.11.16 Time: 9:30 am Location: [address] Group Room  Group Topic: Stress Management  Goal Area(s) Addresses:  Patient will verbalize importance of using healthy stress management.  Patient will identify positive emotions associated with healthy stress management.   Intervention: Stress Management  Activity :  Progressive Muscle Relaxation.  LRT introduced and educated patients on stress management technique of progressive muscle relaxation.  A script was used to deliver both techniques to patients.  Patients were asked to follow the script read a loud by the LRT to engage in practicing the technique.  Education:  Stress Management, Discharge Planning.   Education Outcome: Acknowledges edcuation/In group clarification offered/Needs additional education  Clinical Observations/Feedback: Patient did not attend group.    Ashaun Gaughan, LRT/CTRS         Jurell Basista A 06/23/2015 3:39 PM 

## 2015-06-23 NOTE — Progress Notes (Signed)
D: Patient is alert and oriented. Pt's mood and affect is depressed, flat, and anxious. Pt denies HI and AVH. Pt reports passive suicidal thoughts. Pt rates depression and hopelessness both 4/10, and anxiety 7/10. Pt reports her goal for the day is "eating and prepare for discharge." Pt ambulates with the assistance of wheelchair and forearm cane. Pt tachycardic upon standing. Pt refused ensure supplement, pt states "I'm going to try to eat lunch today." Pt reports fall this evening at 1700, see additional note. Pt C/O anxiety this evening which decreased with PRN medication. Pt C/O of "migraine" this evening, onset "after I ate." Pt is attending unit groups today. A: Active listening by RN. Encouragement/Support provided to pt. Pt encouraged to drink ensure supplement, pt encouraged to eat meals. Medication education reviewed with pt. PRN medication administered for anxiety and migraines per providers orders. Scheduled medications administered per providers orders (See MAR). 15 minute checks continued per protocol for patient safety.  R: Pt verbally contracts for safety, agrees not to harm self and agrees to come to staff with increased intensity of suicidal thoughts. Patient cooperative and receptive to nursing interventions. Pt remains safe.

## 2015-06-23 NOTE — Progress Notes (Signed)
Patient ID: Natasha [name], female   DOB: 1971-06-22, 44 y.o.   MRN: [id number] [facility] MD Progress Note  06/23/2015 1:47 PM CHEETARA HOGE  MRN:  [id number] Subjective:   Patient states she continues to feel very anxious, and that even thinking of discharge is very anxiety provoking. She states she worries about what is going to happen as her mother deteriorates further, due to Parkinson's Disease, although acknowledges that mother is doing very well at present, and even visiting her on unit often. She states " I am so conflicted, I know I can't be in a hospital for ever, but  The idea of going home makes me so nervous ". She has chronic suicidal ideations, and as noted in prior notes, often thinks of suicide as a way to avoid further stress or suffering . She states " on the other hand , I do not want to be like that, I know I should want to get better and to do better ".  She does state " my mood is getting better", and states she is less depressed than she had been . Regarding suppressed appetite, states it is " a little better now". At this time is not endorsing medication side effects.   Objective:  I have discussed case with treatment team and have met with patient. Patient does present with improved mood and affect, but continues to have chronic suicidal ideations, and ruminates about returning home and facing stressors ( mother with whom she lives chronically ill with Parkinson's Disease ) . She denies any current plan or intention of hurting self on unit. She is visible on unit and has been going to groups. Appetite has been poor , probably in part due to anxiety, depression and in part due to Wellbutrin XL- as mentioned in prior notes, patient reluctant to stop Wellbutrin as she does feel it is working for her. Weight today is 125 lbs, down from 127 lbs last week. She does state she is starting to eat better and it may be that her appetite is now improving with addition of Zyprexa . Denies  medication side effects. Patient does , with support, encouragement, review of  Her coping skills, tend to improve insofar as her mood, affect and motivation in returning home, but ruminations about home stressors are ongoing.  No further self injurious behaviors on unit.    Principal Problem: Severe recurrent major depression without psychotic features Diagnosis:   Patient Active Problem List   Diagnosis Date Noted  . Severe recurrent major depression without psychotic features [F33.2] 06/19/2015   Total Time spent with patient: 25 minutes    Past Medical History:  Past Medical History  Diagnosis Date  . C6 spinal cord injury   . Depression     Past Surgical History  Procedure Laterality Date  . Back surgery     Family History: History reviewed. No pertinent family history. Social History:  History  Alcohol Use No     History  Drug Use No    History   Social History  . Marital Status: Single    Spouse Name: N/A  . Number of Children: N/A  . Years of Education: N/A   Social History Main Topics  . Smoking status: Never Smoker   . Smokeless tobacco: Not on file  . Alcohol Use: No  . Drug Use: No  . Sexual Activity: Not on file   Other Topics Concern  . None   Social History Narrative   Additional  History:    Sleep: Fair  Appetite:  Fair  Assessment:   Musculoskeletal: Strength & Muscle Tone: within normal limits Gait & Station: uses braces to help herself  Patient leans: Left  Psychiatric Specialty Exam: Physical Exam  Psychiatric: Her speech is normal. She is withdrawn. Cognition and memory are normal. She expresses impulsivity. She exhibits a depressed mood. She expresses suicidal ideation.    Review of Systems  HENT: Negative.   Eyes: Negative.   Respiratory: Negative.   Cardiovascular: Negative.   Gastrointestinal: Negative.   Genitourinary: Negative.   Musculoskeletal: Negative.   Skin: Negative.   Neurological: Negative.    Endo/Heme/Allergies: Negative.   Psychiatric/Behavioral: Positive for depression and suicidal ideas. Negative for hallucinations, memory loss and substance abuse. The patient is nervous/anxious and has insomnia.     Blood pressure 109/77, pulse 117, temperature 97.9 F (36.6 C), temperature source Oral, resp. rate 16, height 5' 2"  (1.575 m), weight 125 lb (56.7 kg), last menstrual period 06/02/2015, SpO2 98 %.Body mass index is 22.86 kg/(m^2).  General Appearance: Well Groomed  Engineer, water::  Good  Speech:  Clear and Coherent  Volume:  Normal  Mood:  Anxious, Depressed and although mood has been improving gradually, and anxiety seems more prominent now   Affect:  Constricted, anxious   Thought Process:  Coherent and Goal Directed  Orientation:  Full (Time, Place, and Person)  Thought Content:  Rumination as above. No hallucinations, no delusions  Suicidal Thoughts:  Chronic thoughts of suicide, but denies plan or intention on unit, at this time, and contracts for safety on the unit. SI at this time relate to returning home and not being able to deal with mother's chronic illness   Homicidal Thoughts:  No  Memory:  Recent and remote grossly intact   Judgement:  Fair  Insight:  Present  Psychomotor Activity:  Normal  Concentration:  Good  Recall:  Good  Fund of Knowledge:Good  Language: Good  Akathisia:  No  Handed:  Right  AIMS (if indicated):     Assets:  Communication Skills Desire for Improvement Leisure Time Resilience  ADL's:  Intact  Cognition: WNL  Sleep:  Number of Hours: 5   Current Medications: Current Facility-Administered Medications  Medication Dose Route Frequency Provider Last Rate Last Dose  . acetaminophen (TYLENOL) tablet 650 mg  650 mg Oral Q6H PRN Nicholaus Bloom, MD      . alum & mag hydroxide-simeth (MAALOX/MYLANTA) 200-200-20 MG/5ML suspension 30 mL  30 mL Oral Q4H PRN Nicholaus Bloom, MD   30 mL at 06/13/15 1525  . buPROPion (WELLBUTRIN XL) 24 hr tablet  150 mg  150 mg Oral Daily Nicholaus Bloom, MD   150 mg at 06/23/15 8527  . clonazePAM (KLONOPIN) tablet 2 mg  2 mg Oral QHS [name], MD   2 mg at 06/22/15 2201  . feeding supplement (ENSURE ENLIVE) (ENSURE ENLIVE) liquid 237 mL  237 mL Oral BID BM Benjamine Mola, FNP   237 mL at 06/22/15 1333  . hydrOXYzine (ATARAX/VISTARIL) tablet 50 mg  50 mg Oral TID PRN Benjamine Mola, FNP   50 mg at 06/22/15 1630  . lithium carbonate capsule 300 mg  300 mg Oral BID WC Niel Hummer, NP   300 mg at 06/23/15 7824  . magnesium hydroxide (MILK OF MAGNESIA) suspension 30 mL  30 mL Oral Daily PRN Nicholaus Bloom, MD      . OLANZapine zydis [facility]) disintegrating tablet  5 mg  5 mg Oral QHS Nicholaus Bloom, MD   5 mg at 06/22/15 2201  . potassium chloride SA (K-DUR,KLOR-CON) CR tablet 20 mEq  20 mEq Oral BID Niel Hummer, NP   20 mEq at 06/23/15 0828  . QUEtiapine (SEROQUEL) tablet 200 mg  200 mg Oral Once Harriet Butte, NP   200 mg at 06/20/15 0045  . SUMAtriptan (IMITREX) tablet 25 mg  25 mg Oral Q12H PRN [name], MD   25 mg at 06/23/15 0252    Lab Results:  No results found for this or any previous visit (from the past 48 hour(s)).  Physical Findings: AIMS: Facial and Oral Movements Muscles of Facial Expression: None, normal Lips and Perioral Area: None, normal Jaw: None, normal Tongue: None, normal,Extremity Movements Upper (arms, wrists, hands, fingers): None, normal Lower (legs, knees, ankles, toes): None, normal, Trunk Movements Neck, shoulders, hips: None, normal, Overall Severity Severity of abnormal movements (highest score from questions above): None, normal Incapacitation due to abnormal movements: None, normal Patient's awareness of abnormal movements (rate only patient's report): No Awareness, Dental Status Current problems with teeth and/or dentures?: No Does patient usually wear dentures?: No  CIWA:  CIWA-Ar Total: 2 COWS:  COWS Total Score: 4   Assessment- patient's mood  seems to be improving gradually as evidenced by improved range of affect and self report that she is feeling better. Suicidal ideations persist and are chronic, but at this time are not so much in the present, but rather contingent on returning home and finding self unable to cope with mother's Parkinson's Disease, although states mother has actually been doing very well recently. Anxiety has increased, as she approaches discharge, and presents quite anxious. Anorexia is ongoing but has improved partially , probably related to Zyprexa trial. She is tolerating medications well.     Treatment Plan Summary: Daily contact with patient to assess and evaluate symptoms and progress in treatment and Medication management  Depression; will continue the Wellbutrin at 150 mg  QAM  Insomnia, ruminative thinking; will continue Zyprexa Zydis 5 mg QHS  Suicidal ideas;  Continue lithium 300 mg BID as a medication to decrease suicidal ideations-  Lithium level on 06/24/15 Anorexia: continue to encourage PO intake- likely appetite will continue to improve on Zyprexa . Anxiety, Insomnia: Continue Klonopin 2 mg QHS  Have encouraged patient to consider family meeting with  Mother, patient ambivalent but states she will think about it .  Medical Decision Making:  Review of Psycho-Social Stressors (1), Review of Medication Regimen & Side Effects (2) and Review of New Medication or Change in Dosage (2)  Neita Garnet,  MD  06/23/2015, 1:47 PM

## 2015-06-23 NOTE — Plan of Care (Signed)
Problem: Alteration in mood Goal: LTG-Patient reports reduction in suicidal thoughts (Patient reports reduction in suicidal thoughts and is able to verbalize a safety plan for whenever patient is feeling suicidal)  Outcome: Not Progressing Patient continues to endorse having passive suicidal thoughts today. Pt is able to verbally contract for safety.  Problem: Diagnosis: Increased Risk For Suicide Attempt Goal: STG-Patient Will Attend All Groups On The Unit Outcome: Progressing Patient is attending unit groups today. Goal: STG-Patient Will Comply With Medication Regime Outcome: Progressing Patient has adhered to medication regimen today with ease.

## 2015-06-23 NOTE — Progress Notes (Signed)
Patient ID: Melven SartoriusJenifer K Garmany, female   DOB: 12/13/1971, 44 y.o.   MRN: [id number] PER STATE REGULATIONS 482.30  THIS CHART WAS REVIEWED FOR MEDICAL NECESSITY WITH RESPECT TO THE PATIENT'S ADMISSION/ DURATION OF STAY.  NEXT REVIEW DATE: 06/25/2015  Willa RoughJENNIFER JONES Deslyn Cavenaugh, RN, BSN CASE MANAGER

## 2015-06-24 LAB — BASIC METABOLIC PANEL
Anion gap: 8 (ref 5–15)
BUN: 11 mg/dL (ref 6–20)
CO2: 24 mmol/L (ref 22–32)
Calcium: 9.3 mg/dL (ref 8.9–10.3)
Chloride: 106 mmol/L (ref 101–111)
Creatinine, Ser: 0.77 mg/dL (ref 0.44–1.00)
GFR calc Af Amer: >60 mL/min (ref >60)
GFR calc non Af Amer: >60 mL/min (ref >60)
Glucose, Bld: 97 mg/dL (ref 65–99)
Potassium: 3.9 mmol/L (ref 3.5–5.1)
Sodium: 138 mmol/L (ref 135–145)

## 2015-06-24 LAB — LITHIUM LEVEL: Lithium Lvl: 0.58 mmol/L — ABNORMAL LOW (ref 0.60–1.20)

## 2015-06-24 NOTE — Progress Notes (Signed)
Patient ID: Natasha [name], female   DOB: 09-22-1971, 44 y.o.   MRN: [id number] [facility] MD Progress Note  06/24/2015 12:40 PM Natasha [name]  MRN:  [id number] Subjective:   Patient reports ongoing significant anxiety about upcoming discharge, but states she is starting to feel less severely apprehensive about it, and a little more optimistic about her ability to manage home related stressors. She had a fall yesterday- states she did not seriously injure self , no LOC, no cuts, bruises or bleeding, no headache, and neuro checks reported as negative . She states she occasionally falls  Due to her quadriparesia, but it is relatively uncommon. Tolerating medications well- appetite still poor.   Objective:  I have discussed case with treatment team and have met with patient. As discussed with staff patient has chronic , ongoing suicidal ideations. Patient states she has been having these on and off for years, and that they often are not related to any actual intent but just " force of habit", or as a way of dealing with anxiety/depression " knowing that it is a way out if things get really bad ". She has had no further self injurious behaviors on unit. Although still quite anxious about discharge, today seems less so, and describes a gradually increasing sense of optimism that  She will be able to return home and " do all right". She denies medication side effects, other than poor appetite, although states she is " forcing myself to eat more". Of note, weight today is 126 lbs, up from 125 lbs .  She does not feel yesterday's fall was related to any medication side effect.  More responsive to support , encouragement and review of her coping skills . I have encouraged patient to consider IOP level of care after discharge , but she states this would be difficult due to where she lives and transportation challenges . Lithium level 0.58, BMP unremarkable .   Principal Problem: Severe recurrent major  depression without psychotic features Diagnosis:   Patient Active Problem List   Diagnosis Date Noted  . Severe recurrent major depression without psychotic features [F33.2] 06/19/2015   Total Time spent with patient: 25 minutes    Past Medical History:  Past Medical History  Diagnosis Date  . C6 spinal cord injury   . Depression     Past Surgical History  Procedure Laterality Date  . Back surgery     Family History: History reviewed. No pertinent family history. Social History:  History  Alcohol Use No     History  Drug Use No    History   Social History  . Marital Status: Single    Spouse Name: N/A  . Number of Children: N/A  . Years of Education: N/A   Social History Main Topics  . Smoking status: Never Smoker   . Smokeless tobacco: Not on file  . Alcohol Use: No  . Drug Use: No  . Sexual Activity: Not on file   Other Topics Concern  . None   Social History Narrative   Additional History:    Sleep:  Improved   Appetite:  Fair  Assessment:   Musculoskeletal: Strength & Muscle Tone: within normal limits Gait & Station: uses braces to help herself  Patient leans: Left  Psychiatric Specialty Exam: Physical Exam  Psychiatric: Her speech is normal. She is withdrawn. Cognition and memory are normal. She expresses impulsivity. She exhibits a depressed mood. She expresses suicidal ideation.    Review of Systems  HENT: Negative.   Eyes: Negative.   Respiratory: Negative.   Cardiovascular: Negative.   Gastrointestinal: Negative.   Genitourinary: Negative.   Musculoskeletal: Negative.   Skin: Negative.   Neurological: Negative.   Endo/Heme/Allergies: Negative.   Psychiatric/Behavioral: Positive for depression and suicidal ideas. Negative for hallucinations, memory loss and substance abuse. The patient is nervous/anxious and has insomnia.     Blood pressure 115/96, pulse 81, temperature 98.7 F (37.1 C), temperature source Oral, resp. rate 17, height  5' 2"  (1.575 m), weight 126 lb (57.153 kg), last menstrual period 06/02/2015, SpO2 99 %.Body mass index is 23.04 kg/(m^2).  General Appearance: Well Groomed  Engineer, water::  Good  Speech:  Clear and Coherent  Volume:  Normal  Mood:   Anxious, depressed, but partial improvement noted compared to prior   Affect:   Anxious, but more reactive affect, smiling at times appropriately  Thought Process:  Coherent and Goal Directed  Orientation:  Full (Time, Place, and Person)  Thought Content:  Rumination as above. No hallucinations, no delusions  Suicidal Thoughts:  Chronic thoughts of suicide, but denies any current plan or intention  Homicidal Thoughts:  No  Memory:  Recent and remote grossly intact   Judgement:  Fair  Insight:  Present  Psychomotor Activity:  Normal  Concentration:  Good  Recall:  Good  Fund of Knowledge:Good  Language: Good  Akathisia:  No  Handed:  Right  AIMS (if indicated):     Assets:  Communication Skills Desire for Improvement Leisure Time Resilience  ADL's:  Intact  Cognition: WNL  Sleep:  Number of Hours: 5   Current Medications: Current Facility-Administered Medications  Medication Dose Route Frequency Provider Last Rate Last Dose  . acetaminophen (TYLENOL) tablet 650 mg  650 mg Oral Q6H PRN Nicholaus Bloom, MD      . alum & mag hydroxide-simeth (MAALOX/MYLANTA) 200-200-20 MG/5ML suspension 30 mL  30 mL Oral Q4H PRN Nicholaus Bloom, MD   30 mL at 06/13/15 1525  . buPROPion (WELLBUTRIN XL) 24 hr tablet 150 mg  150 mg Oral Daily Nicholaus Bloom, MD   150 mg at 06/24/15 0802  . clonazePAM (KLONOPIN) tablet 2 mg  2 mg Oral QHS [name], MD   2 mg at 06/23/15 2150  . feeding supplement (ENSURE ENLIVE) (ENSURE ENLIVE) liquid 237 mL  237 mL Oral BID BM Benjamine Mola, FNP   237 mL at 06/22/15 1333  . hydrOXYzine (ATARAX/VISTARIL) tablet 50 mg  50 mg Oral TID PRN Benjamine Mola, FNP   50 mg at 06/24/15 0810  . lithium carbonate capsule 300 mg  300 mg Oral BID WC  Niel Hummer, NP   300 mg at 06/24/15 0802  . magnesium hydroxide (MILK OF MAGNESIA) suspension 30 mL  30 mL Oral Daily PRN Nicholaus Bloom, MD      . OLANZapine zydis (ZYPREXA) disintegrating tablet 5 mg  5 mg Oral QHS Nicholaus Bloom, MD   5 mg at 06/23/15 2150  . QUEtiapine (SEROQUEL) tablet 200 mg  200 mg Oral Once Harriet Butte, NP   200 mg at 06/20/15 0045  . SUMAtriptan (IMITREX) tablet 25 mg  25 mg Oral Q12H PRN [name], MD   25 mg at 06/24/15 1610    Lab Results:  Results for orders placed or performed during the hospital encounter of 06/09/15 (from the past 48 hour(s))  Lithium level     Status: Abnormal   Collection  Time: 06/24/15  6:20 AM  Result Value Ref Range   Lithium Lvl 0.58 (L) 0.60 - 1.20 mmol/L    Comment: Performed at [facility] metabolic panel     Status: None   Collection Time: 06/24/15  6:20 AM  Result Value Ref Range   Sodium 138 135 - 145 mmol/L   Potassium 3.9 3.5 - 5.1 mmol/L   Chloride 106 101 - 111 mmol/L   CO2 24 22 - 32 mmol/L   Glucose, Bld 97 65 - 99 mg/dL   BUN 11 6 - 20 mg/dL   Creatinine, Ser 0.77 0.44 - 1.00 mg/dL   Calcium 9.3 8.9 - 10.3 mg/dL   GFR calc non Af Amer >60 >60 mL/min   GFR calc Af Amer >60 >60 mL/min    Comment: (NOTE) The eGFR has been calculated using the CKD EPI equation. This calculation has not been validated in all clinical situations. eGFR's persistently <60 mL/min signify possible Chronic Kidney Disease.    Anion gap 8 5 - 15    Comment: Performed at [facility]    Physical Findings: AIMS: Facial and Oral Movements Muscles of Facial Expression: None, normal Lips and Perioral Area: None, normal Jaw: None, normal Tongue: None, normal,Extremity Movements Upper (arms, wrists, hands, fingers): None, normal Lower (legs, knees, ankles, toes): None, normal, Trunk Movements Neck, shoulders, hips: None, normal, Overall Severity Severity of abnormal movements (highest  score from questions above): None, normal Incapacitation due to abnormal movements: None, normal Patient's awareness of abnormal movements (rate only patient's report): No Awareness, Dental Status Current problems with teeth and/or dentures?: No Does patient usually wear dentures?: No  CIWA:  CIWA-Ar Total: 2 COWS:  COWS Total Score: 4   Assessment-  Although patient remains quite anxious about discharging, her anxiety seems to be less severe, and she reports an increased sense of optimism that she will be able to deal with stressors and " be OK". Suicidal ideations are chronic, but at this time patient denies current plans or intention of hurting self, and has not exhibited any ongoing self injurious behaviors on unit . Her mood presents as improved, and she does smile more, and seems less focused on negatives and fears at this time. She is tolerating new medications ( Lithium, Zyprexa) well at this time . Quadriparesia is chronic,stable, unchanged over a period of years- patient states she occasionally falls, but generally is able to ambulate with can assistance without difficulties .     Treatment Plan Summary: Daily contact with patient to assess and evaluate symptoms and progress in treatment and Medication management  Depression; will continue the Wellbutrin at 150 mg  QAM  Insomnia, ruminative thinking; will continue Zyprexa Zydis 5 mg QHS  Suicidal ideas;  Continue lithium 300 mg BID as a medication to decrease suicidal ideations. Anorexia: continue to encourage PO intake Anxiety, Insomnia: Continue Klonopin 2 mg QHS  Consider discharge soon as she continues to improve   Medical Decision Making:  Review of Psycho-Social Stressors (1), Review of Medication Regimen & Side Effects (2) and Review of New Medication or Change in Dosage (2)  Neita Garnet,  MD  06/24/2015, 12:40 PM

## 2015-06-24 NOTE — Progress Notes (Signed)
D: Pt presents with flat affect and depressed mood. Pt rates depression 4/10. Hopeless 6/10. Anxiety 9.5. Pt given vistaril as requested for anxiety. Pt endorse suicidal thoughts. Pt verbally contracts [facility]. Pt stated that if she do kill herself once she is d/c'd, she would go somewhere where she can't be found, "like a motel". Pt stated that she wouldn't overdose because that's not always effective. Pt stated that she might do something that causes her to bleed out. Pt stated that she do not plan on doing those things but she do think about it. Pt reported having to go back to her same living situation causes her to be increasingly depressed. Pt continues to use wheelchair d/t unsteady gait.  A: Medication administered as ordered per MD. Verbal support given. Pt encouraged to attend groups. Pt contracts for safety. Writer continues to monitor post fall vitals. 15 minute checks performed for safety. R: Pt stated goal is "prepare for tomorrow".

## 2015-06-24 NOTE — Progress Notes (Signed)
The focus of this group is to help patients review their daily goal of treatment and discuss progress on daily workbooks. Pt did not attend the evening group. 

## 2015-06-24 NOTE — [facility] Group Notes (Signed)
[facility] LCSW Group Therapy 06/24/2015 1:15 PM  Type of Therapy: Group Therapy- Feelings about Diagnosis  Participation Level: Active   Participation Quality:  Appropriate  Affect:  Appropriate  Cognitive: Alert and Oriented   Insight:  Developing   Engagement in Therapy: Developing/Improving and Engaged   Modes of Intervention: Clarification, Confrontation, Discussion, Education, Exploration, Limit-setting, Orientation, Problem-solving, Rapport Building, Dance movement psychotherapisteality Testing, Socialization and Support  Description of Group:   This group will allow patients to explore their thoughts and feelings about diagnoses they have received. Patients will be guided to explore their level of understanding and acceptance of these diagnoses. Facilitator will encourage patients to process their thoughts and feelings about the reactions of others to their diagnosis, and will guide patients in identifying ways to discuss their diagnosis with significant others in their lives. This group will be process-oriented, with patients participating in exploration of their own experiences as well as giving and receiving support and challenge from other group members.  Summary of Progress/Problems:  Pt continues to demonstrate developing insight AEB her ability to discuss symptoms and consider alternatives to coping strategies. Pt is receptive to feedback from peers. Pt identified a feeling of relief when when she was given a diagnosis. Pt continues to appropriately challenge peers and interact with them appropriately.  Therapeutic Modalities:   Cognitive Behavioral Therapy Solution Focused Therapy Motivational Interviewing Relapse Prevention Therapy  Chad CordialLauren Carter, LCSWA 06/24/2015 4:27 PM

## 2015-06-24 NOTE — Progress Notes (Signed)
Recreation Therapy Notes  Animal-Assisted Activity (AAA) Program Checklist/Progress Notes Patient Eligibility Criteria Checklist & Daily Group note for Rec Tx Intervention  Date: 07.12.16 Time: 2:45 pm Location: [address] Dayroom   AAA/T Program Assumption of Risk Form signed by Patient/ or Parent Legal Guardian yes  Patient is free of allergies or sever asthma yes  Patient reports no fear of animals yes  Patient reports no history of cruelty to animalsyes  Patient understands his/her participation is voluntary yes  Patient washes hands before animal contact yes  Patient washes hands after animal contact yes  Behavioral Response: Engaged  Education: Hand Washing, Appropriate Animal Interaction   Education Outcome: Acknowledges understanding/In group clarification offered/Needs additional education.   Clinical Observations/Feedback:  Patient attended group.   Linkin Vizzini, LRT/CTRS         Jelani Trueba A 06/24/2015 3:53 PM 

## 2015-06-24 NOTE — Progress Notes (Signed)
Pt refusing ensure as scheduled. Pt ate 25% of her lunch. Pt observed drinking fluids.
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
## 2015-06-24 NOTE — Progress Notes (Signed)
Pt attended spiritual care group on grief and loss facilitated by chaplain Kerith Sherley. Group opened with brief discussion and psycho-social ed around grief and loss in relationships and in relation to self - identifying life patterns, circumstances, changes that cause losses. Established group norm of speaking from own life experience. Group goal of establishing open and affirming space for members to share loss and experience with grief, normalize grief experience and provide psycho social education and grief support.  Group drew on narrative and Alderian therapeutic modalities.   Natasha [name] Wayne MDiv  

## 2015-06-24 NOTE — Progress Notes (Signed)
Adult Psychoeducational Group Note  Date:  06/24/2015 Time:  0900  Group Topic/Focus:  Recovery Goals:   The focus of this group is to identify appropriate goals for recovery and establish a plan to achieve them.  Participation Level:  Active  Participation Quality:  Appropriate  Affect:  Appropriate  Cognitive:  Alert and Appropriate  Insight: Appropriate  Engagement in Group:  Engaged  Modes of Intervention:  Education  Additional Comments:    Mandisa Persinger L 06/24/2015, 10:47 AM

## 2015-06-24 NOTE — Progress Notes (Signed)
D: Patient alert and oriented x 3. Patient is having a lot of anxiety on pending discharge. Patient Denied pain during assessment but came to this nurse at 2105 complaining of a headache 4 out of 10 and requested Imitrex. Imitrex giver per order. At 2210 reassessed pain was 1 out of 10. Pt stated "If I catch it before it gets bad the Imitrex works great." Patient states she is having passive SI but verbally contracted with this Clinical research associatewriter. Patient did not attend evening group. She isolated her self in room during that time.  A: Staff to monitor Q 15 mins for safety. Encouragement and support offered. Scheduled medications administered per orders. R: Patient remains safe on the unit. Patient attended group tonight. Patient visible on hte unit and interacting with peers. Patient taking administered medications.

## 2015-06-25 ENCOUNTER — Encounter (HOSPITAL_COMMUNITY): Payer: Self-pay | Admitting: Registered Nurse

## 2015-06-25 DIAGNOSIS — F332 Major depressive disorder, recurrent severe without psychotic features: Secondary | ICD-10-CM | POA: Insufficient documentation

## 2015-06-25 MED ORDER — BUPROPION HCL ER (XL) 150 MG PO TB24: 150.0000 mg | ORAL_TABLET | Freq: Every day | ORAL | Status: DC

## 2015-06-25 MED ORDER — OLANZAPINE 5 MG PO TBDP: 5.0000 mg | ORAL_TABLET | Freq: Every day | ORAL | Status: DC

## 2015-06-25 MED ORDER — CLONAZEPAM 2 MG PO TABS: 2.0000 mg | ORAL_TABLET | Freq: Every day | ORAL | Status: DC

## 2015-06-25 MED ORDER — SUMATRIPTAN SUCCINATE 50 MG PO TABS: 50.0000 mg | ORAL_TABLET | ORAL | Status: DC | PRN

## 2015-06-25 MED ORDER — LITHIUM CARBONATE 300 MG PO CAPS
300.0000 mg | ORAL_CAPSULE | Freq: Two times a day (BID) | ORAL | Status: DC
Start: 2015-06-25 — End: 2015-09-25

## 2015-06-25 MED ORDER — HYDROXYZINE HCL 50 MG PO TABS: 50.0000 mg | ORAL_TABLET | Freq: Three times a day (TID) | ORAL | Status: DC | PRN

## 2015-06-25 NOTE — Progress Notes (Signed)
Discharge note: Pt received both written and verbal discharge instructions. Pt verbalized understanding of discharge instructions. Pt agreed to f/u appt and med regimen. Pt contracts not to harm self and ready to discharge. Pt received sample meds, prescriptions and belongings. Pt picked up by friend and [facility].
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
## 2015-06-25 NOTE — Progress Notes (Signed)
Recreation Therapy Notes  Date: 07.13.16 Time: 9:30 am Location: [address] Dayroom  Group Topic: Stress Management  Goal Area(s) Addresses:  Patient will verbalize importance of using healthy stress management.  Patient will identify positive emotions associated with healthy stress management.   Intervention: Stress Management  Activity :  Guided Imagery.  LRT introduced and explained the stress management technique of guided imagery.  LRT used a script to deliver the technique.  Patients were asked to follow the scripts read a loud by the LRT to participate in the stress management technique.  Education:  Stress Management, Discharge Planning.   Education Outcome: Acknowledges edcuation/In group clarification offered/Needs additional education  Clinical Observations/Feedback: Patient did not attend group.   Christophr Calix, LRT/CTRS         Velma Hanna A 06/25/2015 1:39 PM 

## 2015-06-25 NOTE — Progress Notes (Signed)
Patient ID: Melven SartoriusJenifer K Ringel, female   DOB: [birth year], 44 y.o.   MRN: [id number] PER STATE REGULATIONS 482.30  THIS CHART WAS REVIEWED FOR MEDICAL NECESSITY WITH RESPECT TO THE PATIENT'S ADMISSION/ DURATION OF STAY.  NEXT REVIEW DATE: 06/29/2015  Willa RoughJENNIFER JONES Faustino Luecke, RN, BSN CASE MANAGER

## 2015-06-25 NOTE — Progress Notes (Signed)
  [facility] Dis[facility] :  Will you be returning to the same living situation after discharge:  Yes,  Pt will return home with mother At discharge, do you have transportation home?: Yes,  friend to provide transportation Do you have the ability to pay for your medications: Yes,  Pt provided with samples and prescriptions  Release of information consent forms completed and in the chart;  Patient's signature needed at discharge.  Patient to Follow up at: Follow-up Information    Follow up with [facility] On 07/08/2015.   Why:  Appointment with Dr. Betti Cruzeddy on Tuesday July 26th at 4:50pm. Please call office if you need to reschedule appointment. Ask about referral to therapist in office.   Contact information:   [address],  [place], [place] [address] Phone: [phone]      Follow [facility].   Why:  A referral for the Mental Health IOP program was made on 7/13. A staff member will contact you in the next few days to set up an appointment. If you do not hear from them, please call the number below to schedule intake appointment   Contact information:   [address] [place] [address] [phone]      Patient denies SI/HI: Yes,  Pt denies. However, Pt suffers with chronic, passive SI. See MD notes for details    Safety Planning and Suicide Prevention discussed: Yes,  with mother. See SPE note for further details  Have you used any form of tobacco in the last 30 days? (Cigarettes, Smokeless Tobacco, Cigars, and/or Pipes): No  Has patient been referred to the Quitline?: N/A patient is not a smoker  Elaina HoopsCarter, Jacklin Zwick M 06/25/2015, 4:45 PM

## 2015-06-25 NOTE — BHH Suicide Risk Assessment (Signed)
[facility] Discharge Suicide Risk Assessment   Demographic Factors:  44 year old single female, lives with mother, on disability  Total Time spent with patient: 30 minutes  Musculoskeletal: Strength & Muscle Tone: within normal limits Gait & Station: normal Patient leans: N/A  Psychiatric Specialty Exam: Physical Exam  ROS  Blood pressure 110/82, pulse 109, temperature 98.5 F (36.9 C), temperature source Oral, resp. rate 15, height  (1.575 m), weight 126 lb (57.153 kg), last menstrual period 06/02/2015, SpO2 99 %.Body mass index is 23.04 kg/(m^2).  General Appearance: Well Groomed  Eye Contact::  Good  Speech:  Normal Rate409  Volume:  Normal  Mood:  today feeling better, less depressed  Affect:  more reactive, less constricted - does remain anxious   Thought Process:  Goal Directed and Linear  Orientation:  Full (Time, Place, and Person)  Thought Content:  no hallucinations, no delusions   Suicidal Thoughts:  today denies any plan or intention of suicide or of hurting self  Homicidal Thoughts:  No  Memory:  recent and remote grossly intact   Judgement:  Other:  improved   Insight:  improved   Psychomotor Activity:  Normal  Concentration:  Good  Recall:  Good  Fund of Knowledge:Good  Language: Good  Akathisia:  No  Handed:  Right  AIMS (if indicated):     Assets:  Communication Skills Housing Resilience Social Support  Sleep:  Number of Hours: 5  Cognition: WNL  ADL's:  Improved    Have you used any form of tobacco in the last 30 days? (Cigarettes, Smokeless Tobacco, Cigars, and/or Pipes): No  Has this patient used any form of tobacco in the last 30 days? (Cigarettes, Smokeless Tobacco, Cigars, and/or Pipes) No  Mental Status Per Nursing Assessment::   On Admission:     Current Mental Status by Physician: At this time patient is alert , attentive, well related. Today she is feeling more prepared and more optimistic about discharge. She is expressing a lot of  interest and motivation in going to IOP, and as discussed with CSW, who has spoken with mother, mother has stated she will be able to transport patient for said program. Today patient is reporting improved mood and affect does seem more reactive. Although still anxious, she says she is feeling better today. She denies any current suicidal plans or intentions at the present time. There are no psychotic symptoms. She is tolerating medications well.  Appetite has been decreased on Wellbutrin trial, but now improving with ongoing treatment and addition of Zyprexa .[name] Kitchen  Loss Factors: Disability, mother has developed Parkinson's Disease , father passed away earlier this year .  Historical Factors: Prior suicide attempts  Risk Reduction Factors:   Sense of responsibility to family, Living with another person, especially a relative, Positive social support and Positive coping skills or problem solving skills  * Mother will be keeping, helping to manage patient's medications.  Continued Clinical Symptoms:  As noted patient is currently improved - mood is better, affect more reactive, less anxious and less apprehensive about discharge, denying any current suicidal plan or intention, and optimistic about continuing care as outpatient ( is being referred to IOP) . As noted, patient has  History of chronic, persistent suicidal ideations, and history of serious suicide attempt in the past - At this time states she feels medications are helping decrease these thoughts and has none today.   Cognitive Features That Contribute To Risk:  No gross cognitive deficits noted upon discharge.  Is alert , attentive, and oriented x 3   Suicide Risk:  Moderate:  Frequent suicidal ideation with limited intensity, and duration, some specificity in terms of plans, no associated intent, good self-control, limited dysphoria/symptomatology, some risk factors present, and identifiable protective factors, including available and  accessible social support.  Principal Problem: Severe recurrent major depression without psychotic features Discharge Diagnoses:  Patient Active Problem List   Diagnosis Date Noted  . Severe recurrent major depression without psychotic features [F33.2] 06/19/2015    Follow-up Information    Follow up with [facility] On 07/08/2015.   Why:  Appointment with Dr. Betti Cruzeddy on Tuesday July 26th at 4:50pm. Please call office if you need to reschedule appointment. Ask about referral to therapist in office.   Contact information:   [address],  [place], [place] [address] Phone: [phone]      [facility].   Contact information:   [address] [place] [address] [phone]      Plan Of Care/Follow-up recommendations:  Activity:  as tolerated Diet:  Regular Tests:  NA Other:  See below  Is patient on multiple antipsychotic therapies at discharge:  No   Has Patient had three or more failed trials of antipsychotic monotherapy by history:  No  Recommended Plan for Multiple Antipsychotic Therapies: NA  Patient is  Returning home after discharge. Mother will pick her up. Plans to follow up with IOP and states she is excited about this treatment option. At this time agrees to return to ED if symptoms worsen again.   Loralai Eisman 06/25/2015, 12:04 PM

## 2015-06-25 NOTE — [facility] Group Notes (Signed)
[facility] Note  06/25/2015 8:45 AM  Participation Quality: Alert, Appropriate and Oriented  Mood/Affect: Appropriate  Depression Rating: 4  Anxiety Rating: 10  Thoughts of Suicide: Pt endorses passive SI  Will you contract for safety? Yes  Current AVH: Pt denies  Plan for Discharge/Comments: Pt attended discharge planning group and actively participated in group. CSW discussed suicide prevention education with the group and encouraged them to discuss discharge planning and any relevant barriers. Pt expressed that she is very anxious about going home today but identified using distraction as a coping mechanism.  Transportation Means: Pt reports access to transportation  Supports: Emeline GeneralFriend  Alonia Dibuono Carter, Theresia MajorsLCSWA 06/25/2015 9:31 AM

## 2015-06-26 ENCOUNTER — Telehealth (HOSPITAL_COMMUNITY): Payer: Self-pay | Admitting: Psychiatry

## 2015-06-26 DIAGNOSIS — N898 Other specified noninflammatory disorders of vagina: Secondary | ICD-10-CM | POA: Diagnosis not present

## 2015-06-26 DIAGNOSIS — T192XXA Foreign body in vulva and vagina, initial encounter: Secondary | ICD-10-CM | POA: Diagnosis not present

## 2015-06-26 NOTE — Telephone Encounter (Signed)
D:  Placed call to patient's home number and cell number and left messages on both for her to call writer back re: new start date in [facility]-IOP (06-27-15 @ 8:30 a.m.).  A:  Informed Dr. Jama Flavorsobos and Dr. Ladona Ridgelaylor.

## 2015-06-27 ENCOUNTER — Encounter (HOSPITAL_COMMUNITY): Payer: Self-pay | Admitting: Psychiatry

## 2015-06-27 ENCOUNTER — Other Ambulatory Visit (HOSPITAL_COMMUNITY): Payer: Medicare Other | Attending: Psychiatry | Admitting: Behavioral Health

## 2015-06-27 DIAGNOSIS — F332 Major depressive disorder, recurrent severe without psychotic features: Secondary | ICD-10-CM | POA: Insufficient documentation

## 2015-06-27 DIAGNOSIS — F331 Major depressive disorder, recurrent, moderate: Secondary | ICD-10-CM

## 2015-06-27 DIAGNOSIS — R45851 Suicidal ideations: Secondary | ICD-10-CM | POA: Diagnosis not present

## 2015-06-27 NOTE — Telephone Encounter (Signed)
Patient using crutch to ambulate upon arrival. While Security retrieved wheelchair from foyer patient states "My cane slipped on the wet floor and I fell over." Patient insists "It was nothing we are fine." Patient assisted to wheelchair, no injury noted. Outpatient MD made aware. Patient escorted to outpatient for IOP appointment. Upon walking out, around noon patient states "I am fine, I do not need to be seen." Encouraged patient to seek attention if necessary, verbalizes understanding
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
## 2015-06-30 ENCOUNTER — Other Ambulatory Visit (HOSPITAL_COMMUNITY): Payer: Medicare Other | Admitting: Psychiatry

## 2015-06-30 DIAGNOSIS — F331 Major depressive disorder, recurrent, moderate: Secondary | ICD-10-CM

## 2015-06-30 DIAGNOSIS — F332 Major depressive disorder, recurrent severe without psychotic features: Secondary | ICD-10-CM | POA: Diagnosis not present

## 2015-06-30 NOTE — Progress Notes (Signed)
Natasha [name] is a 44 y.o. female patient, who was transitioned from the inpatient unit.  Met briefly with pt, to inquire about SI within the past twenty four hours (according to the daily self inventory sheet she had completed).  Pt admits to superficially cutting on her neck with a knife yesterday.  States she didn't have anything to do and was wanting to know how it would feel to cut on her neck.  According to pt, the cuts (superficial) bled a little.  Discussed safety options at length with pt.  Pt able to contract for safety.  A:  Encouraged pt to increase her self care activities.  Strongly encouraged patient to journal and to start the journal today.  Encouraged pt to not go straight home today; to maybe go out for lunch, library, movie, etc.  Informed Dr. Taylor.  R:  Pt receptive.        , RITA, CNA, M.Ed 

## 2015-06-30 NOTE — Progress Notes (Signed)
    Daily Group Progress Note  Program: IOP  Group Time:   Participation Level: Active/Minimal/None   Behavioral Response: Appropriate/Sharing/Drowsy/Resistant/Nonresponsive   Type of Therapy:  Group Therapy   Summary of Progress: This was client's first group and she came in late after her assessment and orientation to the program was complete. Client was open and sharing and states that it is difficult for her change her negative thinking and she gets into a cycle where she puts herself down often. She states that it might be easier for her look at her ways of coping and start there to help change her perspective and thoughts and feelings. She was receptive to other client's feedback and engaged in group session.  Clinician introduced the topic of the cognitive triangle and explained that our thoughts feelings and actions are all interconnected and if we can change one we can change them all. The group members were asked to think of a scenario when they lost a relationship that was important to them and give examples of how depression has affected their thoughts, feelings and coping skills. They were then asked to shift their thought process and give examples of how these thoughts, feelings and actions may change if they were to look at the situation in a different perspective. Clients gave examples of how changing thoughts contributes to different feelings and more positive ways of coping.  For the last part of group the Chaplain engaged clients in a grief and loss discussion.   PPL CorporationKristin Cheshire

## 2015-07-01 ENCOUNTER — Other Ambulatory Visit (HOSPITAL_COMMUNITY): Payer: Medicare Other | Admitting: Psychiatry

## 2015-07-01 DIAGNOSIS — F332 Major depressive disorder, recurrent severe without psychotic features: Secondary | ICD-10-CM | POA: Diagnosis not present

## 2015-07-01 DIAGNOSIS — F331 Major depressive disorder, recurrent, moderate: Secondary | ICD-10-CM

## 2015-07-01 NOTE — Progress Notes (Signed)
    Daily Group Progress Note  Program: IOP  Group Time: 9:00-10:30  Participation Level: Active  Behavioral Response: Appropriate  Type of Therapy:  Psycho-education Group  Summary of Progress: Pt. Participated in medication education group with Michelle NasutiElena      Group Time: 10:30-12:00  Participation Level:  Active  Behavioral Response: Appropriate  Type of Therapy: Group Therapy  Summary of Progress: Pt. Participated in discussion about developing practice of self-care. Pt. Discussed co-dependent relationship with her mother, stress of providing care for her mother who has Parkinson's disease.   Shaune PollackBrown, Jazzmyn Filion B, LPC

## 2015-07-02 ENCOUNTER — Other Ambulatory Visit (HOSPITAL_COMMUNITY): Payer: Medicare Other | Admitting: Psychiatry

## 2015-07-02 DIAGNOSIS — F331 Major depressive disorder, recurrent, moderate: Secondary | ICD-10-CM

## 2015-07-02 DIAGNOSIS — F332 Major depressive disorder, recurrent severe without psychotic features: Secondary | ICD-10-CM | POA: Diagnosis not present
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
## 2015-07-03 ENCOUNTER — Ambulatory Visit (HOSPITAL_COMMUNITY)
Admission: RE | Admit: 2015-07-03 | Discharge: 2015-07-03 | Disposition: A | Discharge status: Home / Self Care | Payer: Medicare Other | Attending: Psychiatry | Admitting: Psychiatry

## 2015-07-03 ENCOUNTER — Emergency Department (HOSPITAL_COMMUNITY)
Admission: EM | Admit: 2015-07-03 | Discharge: 2015-07-03 | Disposition: A | Discharge status: Home / Self Care | Payer: Medicare Other | Attending: Emergency Medicine | Admitting: Emergency Medicine

## 2015-07-03 ENCOUNTER — Encounter (HOSPITAL_COMMUNITY): Payer: Self-pay | Admitting: Emergency Medicine

## 2015-07-03 ENCOUNTER — Other Ambulatory Visit (HOSPITAL_COMMUNITY): Payer: Medicare Other | Admitting: Psychiatry

## 2015-07-03 DIAGNOSIS — Y998 Other external cause status: Secondary | ICD-10-CM | POA: Diagnosis not present

## 2015-07-03 DIAGNOSIS — Z62810 Personal history of physical and sexual abuse in childhood: Secondary | ICD-10-CM | POA: Diagnosis present

## 2015-07-03 DIAGNOSIS — S1081XA Abrasion of other specified part of neck, initial encounter: Secondary | ICD-10-CM | POA: Diagnosis not present

## 2015-07-03 DIAGNOSIS — X788XXA Intentional self-harm by other sharp object, initial encounter: Secondary | ICD-10-CM | POA: Insufficient documentation

## 2015-07-03 DIAGNOSIS — Y9289 Other specified places as the place of occurrence of the external cause: Secondary | ICD-10-CM | POA: Diagnosis not present

## 2015-07-03 DIAGNOSIS — Z79899 Other long term (current) drug therapy: Secondary | ICD-10-CM | POA: Insufficient documentation

## 2015-07-03 DIAGNOSIS — Z3202 Encounter for pregnancy test, result negative: Secondary | ICD-10-CM | POA: Diagnosis not present

## 2015-07-03 DIAGNOSIS — F1310 Sedative, hypnotic or anxiolytic abuse, uncomplicated: Secondary | ICD-10-CM | POA: Diagnosis not present

## 2015-07-03 DIAGNOSIS — Z62811 Personal history of psychological abuse in childhood: Secondary | ICD-10-CM | POA: Diagnosis present

## 2015-07-03 DIAGNOSIS — F322 Major depressive disorder, single episode, severe without psychotic features: Secondary | ICD-10-CM | POA: Diagnosis not present

## 2015-07-03 DIAGNOSIS — F329 Major depressive disorder, single episode, unspecified: Secondary | ICD-10-CM | POA: Insufficient documentation

## 2015-07-03 DIAGNOSIS — Z87828 Personal history of other (healed) physical injury and trauma: Secondary | ICD-10-CM | POA: Insufficient documentation

## 2015-07-03 DIAGNOSIS — F332 Major depressive disorder, recurrent severe without psychotic features: Secondary | ICD-10-CM | POA: Diagnosis present

## 2015-07-03 DIAGNOSIS — R45851 Suicidal ideations: Secondary | ICD-10-CM | POA: Diagnosis present

## 2015-07-03 DIAGNOSIS — F32A Depression, unspecified: Secondary | ICD-10-CM

## 2015-07-03 DIAGNOSIS — Y9389 Activity, other specified: Secondary | ICD-10-CM | POA: Insufficient documentation

## 2015-07-03 DIAGNOSIS — T1491 Suicide attempt: Secondary | ICD-10-CM | POA: Diagnosis not present

## 2015-07-03 DIAGNOSIS — Z7289 Other problems related to lifestyle: Secondary | ICD-10-CM

## 2015-07-03 DIAGNOSIS — IMO0002 Reserved for concepts with insufficient information to code with codable children: Secondary | ICD-10-CM

## 2015-07-03 DIAGNOSIS — S3982XS Other specified injuries of lower back, sequela: Secondary | ICD-10-CM | POA: Diagnosis not present

## 2015-07-03 LAB — RAPID URINE DRUG SCREEN, HOSP PERFORMED
Amphetamines: NOT DETECTED
Barbiturates: NOT DETECTED
Benzodiazepines: POSITIVE — AB
Cocaine: NOT DETECTED
Opiates: NOT DETECTED
Tetrahydrocannabinol: NOT DETECTED

## 2015-07-03 LAB — COMPREHENSIVE METABOLIC PANEL
ALT: 18 U/L (ref 14–54)
AST: 21 U/L (ref 15–41)
Albumin: 4.6 g/dL (ref 3.5–5.0)
Alkaline Phosphatase: 69 U/L (ref 38–126)
Anion gap: 8 (ref 5–15)
BUN: 9 mg/dL (ref 6–20)
CO2: 26 mmol/L (ref 22–32)
Calcium: 9.6 mg/dL (ref 8.9–10.3)
Chloride: 104 mmol/L (ref 101–111)
Creatinine, Ser: 0.75 mg/dL (ref 0.44–1.00)
GFR calc Af Amer: >60 mL/min (ref >60)
GFR calc non Af Amer: >60 mL/min (ref >60)
Glucose, Bld: 90 mg/dL (ref 65–99)
Potassium: 3.4 mmol/L — ABNORMAL LOW (ref 3.5–5.1)
Sodium: 138 mmol/L (ref 135–145)
Total Bilirubin: 0.5 mg/dL (ref 0.3–1.2)
Total Protein: 7.8 g/dL (ref 6.5–8.1)

## 2015-07-03 LAB — POC URINE PREG, ED: Preg Test, Ur: NEGATIVE

## 2015-07-03 LAB — CBC
HCT: 46.7 % — ABNORMAL HIGH (ref 36.0–46.0)
Hemoglobin: 15.7 g/dL — ABNORMAL HIGH (ref 12.0–15.0)
MCH: 28.9 pg (ref 26.0–34.0)
MCHC: 33.6 g/dL (ref 30.0–36.0)
MCV: 86 fL (ref 78.0–100.0)
Platelets: 290 10*3/uL (ref 150–400)
RBC: 5.43 MIL/uL — ABNORMAL HIGH (ref 3.87–5.11)
RDW: 13.1 % (ref 11.5–15.5)
WBC: 8.4 10*3/uL (ref 4.0–10.5)

## 2015-07-03 LAB — ACETAMINOPHEN LEVEL: Acetaminophen (Tylenol), Serum: <10 ug/mL — ABNORMAL LOW (ref 10–30)

## 2015-07-03 LAB — ETHANOL: Alcohol, Ethyl (B): <5 mg/dL (ref <5)

## 2015-07-03 LAB — SALICYLATE LEVEL: Salicylate Lvl: <4 mg/dL (ref 2.8–30.0)

## 2015-07-03 MED ORDER — ZOLPIDEM TARTRATE 5 MG PO TABS: 5.0000 mg | ORAL_TABLET | Freq: Every evening | ORAL | Status: DC | PRN

## 2015-07-03 MED ORDER — BUPROPION HCL ER (XL) 150 MG PO TB24: 150.0000 mg | ORAL_TABLET | Freq: Every day | ORAL | Status: DC

## 2015-07-03 MED ORDER — CLONAZEPAM 0.5 MG PO TABS: 2.0000 mg | ORAL_TABLET | Freq: Every day | ORAL | Status: DC

## 2015-07-03 MED ORDER — IBUPROFEN 200 MG PO TABS: 600.0000 mg | ORAL_TABLET | Freq: Three times a day (TID) | ORAL | Status: DC | PRN

## 2015-07-03 MED ORDER — ONDANSETRON HCL 4 MG PO TABS: 4.0000 mg | ORAL_TABLET | Freq: Three times a day (TID) | ORAL | Status: DC | PRN

## 2015-07-03 MED ORDER — ALUM & MAG HYDROXIDE-SIMETH 200-200-20 MG/5ML PO SUSP: 30.0000 mL | ORAL | Status: DC | PRN

## 2015-07-03 MED ORDER — LITHIUM CARBONATE 300 MG PO CAPS
300.0000 mg | ORAL_CAPSULE | Freq: Two times a day (BID) | ORAL | Status: DC
  Administered 2015-07-03: 300 mg via ORAL
  Filled 2015-07-03: qty 1

## 2015-07-03 MED ORDER — ACETAMINOPHEN 325 MG PO TABS: 650.0000 mg | ORAL_TABLET | ORAL | Status: DC | PRN

## 2015-07-03 MED ORDER — NICOTINE 21 MG/24HR TD PT24: 21.0000 mg | MEDICATED_PATCH | Freq: Every day | TRANSDERMAL | Status: DC | PRN

## 2015-07-03 MED ORDER — HYDROXYZINE HCL 25 MG PO TABS: 50.0000 mg | ORAL_TABLET | Freq: Three times a day (TID) | ORAL | Status: DC | PRN

## 2015-07-03 MED ORDER — OLANZAPINE 5 MG PO TBDP: 5.0000 mg | ORAL_TABLET | Freq: Every day | ORAL | Status: DC

## 2015-07-03 NOTE — ED Notes (Signed)
Pt wanded and belongings searched by security. 

## 2015-07-03 NOTE — ED Notes (Addendum)
Pt requests [facility] placement and was under the impression she was going back to [facility] facility. Spoke with TTS and SW. Patient says she spoke with MD Kobos and "he told me I could come right back there." Tom at bedside to speak with patient about options for placement. Patient acknowledges understanding.  Offered meal tray twice. Refused both times.

## 2015-07-03 NOTE — BH Assessment (Addendum)
B[facility] Assessment Progress Note  Berneice Heinrich, RN, [facility] spoke to this writer to report that this pt was being sent to [facility] for medical clearance.  She reports that pt needs psychiatric hospitalization, but that a bed appropriate to her needs is not available at this time and alternative placement will need to be sought.  At 15:35 Christiane Ha from [facility] called.  Pt has been accepted to their facility by Dr. Wendall Stade; she will go to the [facility] unit.  Please call report to [phone].  Pt is not agreeable to voluntary admission to any facility other than [facility].  This has been staffed with EDP Mancel Bale, MD, who agrees to initiate IVC.  Doylene Canning, MA Triage Specialist [phone]  Addendum:  At 16:20 petition was faxed to [facility]; service of Findings and Custody Order is pending at this time.  Petition and 1st Examination have been faxed to [facility] [facility].  Pt's nurse has been notified.  Doylene Canning, MA Triage Specialist [phone]

## 2015-07-03 NOTE — ED Provider Notes (Addendum)
CSN: [id number]     Arrival date & time 07/03/15  1024 History   First MD Initiated Contact with Patient 07/03/15 1028     Chief Complaint  Patient presents with  . Suicidal     (Consider location/radiation/quality/duration/timing/severity/associated sxs/prior Treatment) HPI   Natasha [name] is a 44 y.o. femaleWho presents for evaluation of suicidal ideation with self-inflicted injury and ongoing depression. She saw her therapist this morning who advised that she come here for treatment and readmission. She has been in outpatient intensive therapy, for 4 days, since hospital discharge. She denies other suicide attempts. She states that she was trying to carve her neck to see what it felt like as a trial, for future suicide attempt by slashing neck.she used a razor to injure herself. She has never done this previously.She states that she was feeling out of control when she did it during the early morning hours today. She is taking her usual prescribed medications. There are no other known modifying factors.   Past Medical History  Diagnosis Date  . C6 spinal cord injury   . Depression    Past Surgical History  Procedure Laterality Date  . Back surgery     Family History  Problem Relation Age of Onset  . Alcohol abuse Father   . Alcohol abuse Brother    History  Substance Use Topics  . Smoking status: Never Smoker   . Smokeless tobacco: Not on file  . Alcohol Use: No   OB History    No data available     Review of Systems  All other systems reviewed and are negative.     Allergies  Review of patient's allergies indicates no known allergies.  Home Medications   Prior to Admission medications   Medication Sig Start Date End Date Taking? Authorizing Provider  buPROPion (WELLBUTRIN XL) 150 MG 24 hr tablet Take 1 tablet (150 mg total) by mouth daily. 06/25/15   Shuvon B Rankin, NP  clonazePAM (KLONOPIN) 2 MG tablet Take 1 tablet (2 mg total) by mouth at bedtime. 06/25/15    Shuvon B Rankin, NP  hydrOXYzine (ATARAX/VISTARIL) 50 MG tablet Take 1 tablet (50 mg total) by mouth 3 (three) times daily as needed for anxiety. 06/25/15   Shuvon B Rankin, NP  lithium carbonate 300 MG capsule Take 1 capsule (300 mg total) by mouth 2 (two) times daily with a meal. 06/25/15   Shuvon B Rankin, NP  OLANZapine zydis (ZYPREXA) 5 MG disintegrating tablet Take 1 tablet (5 mg total) by mouth at bedtime. 06/25/15   Shuvon B Rankin, NP  SUMAtriptan (IMITREX) 50 MG tablet Take 1 tablet (50 mg total) by mouth every 2 (two) hours as needed for migraine. May repeat in 2 hours if headache persists or recurs. 06/25/15   Talmage Nap, NP   LMP 06/02/2015 (Exact Date) Physical Exam  Constitutional: She is oriented to person, place, and time. She appears well-developed and well-nourished. No distress.  HENT:  Head: Normocephalic and atraumatic.  Right Ear: External ear normal.  Left Ear: External ear normal.  Eyes: Conjunctivae and EOM are normal. Pupils are equal, round, and reactive to light.  Neck: Normal range of motion and phonation normal. Neck supple.  Cardiovascular: Normal rate, regular rhythm and normal heart sounds.   Pulmonary/Chest: Effort normal and breath sounds normal. She exhibits no bony tenderness.  Abdominal: Soft. There is no tenderness.  Musculoskeletal: Normal range of motion.  Neurological: She is alert and oriented to person, place,  and time. No cranial nerve deficit or sensory deficit. She exhibits normal muscle tone. Coordination normal.  Skin: Skin is warm, dry and intact.  5 Superficial inearabrasions (about 12 cm each) of the left neck, horizontal aspect, not bleeding or draining.  Psychiatric: She has a normal mood and affect. Her behavior is normal. Judgment and thought content normal.  Nursing note and vitals reviewed.   ED Course  Procedures (including critical care  time)  ________________________________________________________________________________ Note from Dr. Rosette Reveal about IOP treatment: Date of Admission: 06/27/2015  Date of Discharge: 07/03/2015  Reason for Admission:transition from inpatient for depression and suicidal thoughts  IOP Course: [name] attended and participated in groups. Initially she felt some better than she had before inpatient admission but gradually felt more and more that she could not control her impulses to hurt herself. She continued having no appetite and no encouragement seemed to make a difference. Today she made cuts on her neck with thoughts of killing herself. The impulses have a feel of obsessive compulsive thinking. She says she had these obsessive thoughts to kill herself and that she has to do it and do it right. At the same time she does not want to die and is more optimistic about her future. She realizes she will be seen as malingering and silly And hates that but has to do it anyway. IOP was helpful she says and she hopes she can come back.  Mental Status at Discharge:suicidal thoughts with cuts on her neck _________________________________________________________________________________   Medications - No data to display  Patient Vitals for the past 24 hrs:  BP Temp Temp src Pulse Resp SpO2  07/03/15 1033 110/81 mmHg 98 F (36.7 C) Oral 81 17 100 %    TTS consultation- to arrange for admission.   15:25-the patient has become resistant to staying. She will therefore be involuntarily committed and placed in a psychiatric facility.  CRITICAL CARE Performed by: Flint Melter Total critical care time: 35 minutes Critical care time was exclusive of separately billable procedures and treating other patients. Critical care was necessary to treat or prevent imminent or life-threatening deterioration. Critical care was time spent personally by me on the following activities: development of  treatment plan with patient and/or surrogate as well as nursing, discussions with consultants, evaluation of patient's response to treatment, examination of patient, obtaining history from patient or surrogate, ordering and performing treatments and interventions, ordering and review of laboratory studies, ordering and review of radiographic studies, pulse oximetry and re-evaluation of patient's condition.  Labs Review Labs Reviewed - No data to display  Imaging Review No results found.   EKG Interpretation None      MDM   Final diagnoses:  Depression  Self-inflicted injury    Depression with suicidal gesture, and progressive symptoms. Patient has made statements that are concerning for rapidly advancing risk behavior.  Nursing Notes Reviewed/ Care Coordinated, and agree without changes. Applicable Imaging Reviewed.  Interpretation of Laboratory Data incorporated into ED treatment  Plan- TTS evaluation, to arrange admission.   Mancel Bale, MD 07/03/15 1202  Mancel Bale, MD 07/03/15 1530  She has been [facility] at [facility] for [facility] treatment  Mancel Bale, MD 07/03/15 1546

## 2015-07-03 NOTE — ED Notes (Signed)
Bed: WA30 Expected date:  Expected time:  Means of arrival:  Comments: Tr4 

## 2015-07-03 NOTE — ED Notes (Signed)
Went to collect the blood sample,lead tech stated that they would collect the sample as soon as pt changed her clothes

## 2015-07-03 NOTE — Progress Notes (Signed)
Pt pcp listed as [facility]  Address: [address] [place], [place] [address]  Telephone: [phone] Telephone: [phone]

## 2015-07-03 NOTE — ED Notes (Addendum)
Pt brought in by [facility] from [facility] for medical clearance.  Pt is suicidal with self inflicted superficial lacerations to left side of neck.  Pt states that she has been battling depression for a while then got worse yesterday.  Pt uses walking crutch/cane and states that she has a wheelchair at home that she uses as well.

## 2015-07-03 NOTE — Progress Notes (Signed)
    Daily Group Progress Note  Program: IOP  Group Time: 9:00-10:30  Participation Level: Active  Behavioral Response: sharing, lethargic  Type of Therapy:  Group Therapy  Summary of Progress: Pt. Presented low energy, talkative. Pt. Reported that she has lost desire to eat. Pt. Discussed history of eating disorder twenty years ago. Pt. Reported that she has not eaten in seven days, has had some coffee and drank a meal replacement last night. Pt. Reported that she notices problems with her gait, low energy. Pt. Was instructed related to concerns about dehydration. Pt. Met with nurse manager who checked blood pressure which was low and provided water.      Group Time: 10:30-12:00  Participation Level:  Active  Behavioral Response: Appropriate  Type of Therapy: Psycho-education Group  Summary of Progress: Pt. Watched and discussed Cristie Hem video about self-compassion and participated in discussion about components of self-compassion i.e., mindfulness, words of kindness to self, acknowledgement of common humanity.   Nancie Neas, LPC
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
## 2015-07-03 NOTE — ED Notes (Addendum)
Pt. Is unable to use the restroom at this time, but is aware that we need a urine specimen. Pt. Was given coke. 

## 2015-07-03 NOTE — ED Notes (Signed)
Pt getting changed into paper scrubs.

## 2015-07-03 NOTE — Progress Notes (Signed)
Patient ID: [name], female   DOB: 03/04/1971, 44 y.o.   MRN: [id number] Discharge Note  Patient:  [name] is an 44 y.o., female DOB:  Apr 21, 1971  Date of Admission:  06/27/2015  Date of Discharge:  07/03/2015  Reason for Admission:transition from inpatient for depression and suicidal thoughts  IOP Course:  Ms Causey attended and participated in groups.  Initially she felt some better than she had before inpatient admission but gradually felt more and more that she could not control her impulses to hurt herself.  She continued having no appetite and no encouragement seemed to make a difference.  Today she made cuts on her neck with thoughts of killing herself.  The impulses have a feel of obsessive compulsive thinking.  She says she had these obsessive thoughts to kill herself and that she has to do it and do it right.  At the same time she does not want to die and is more optimistic about her future.  She realizes she will be seen as malingering and silly  And hates that but has to do it anyway.  IOP was helpful she says and she hopes she can come back.  Mental Status at Discharge:suicidal thoughts with cuts on her neck  Lab Results: No results found for this or any previous visit (from the past 48 hour(s)).   Current outpatient prescriptions:  .  buPROPion (WELLBUTRIN XL) 150 MG 24 hr tablet, Take 1 tablet (150 mg total) by mouth daily., Disp: 30 tablet, Rfl: 0 .  clonazePAM (KLONOPIN) 2 MG tablet, Take 1 tablet (2 mg total) by mouth at bedtime., Disp: 15 tablet, Rfl: 0 .  hydrOXYzine (ATARAX/VISTARIL) 50 MG tablet, Take 1 tablet (50 mg total) by mouth 3 (three) times daily as needed for anxiety., Disp: 60 tablet, Rfl: 0 .  lithium carbonate 300 MG capsule, Take 1 capsule (300 mg total) by mouth 2 (two) times daily with a meal., Disp: 60 capsule, Rfl: 0 .  OLANZapine zydis (ZYPREXA) 5 MG disintegrating tablet, Take 1 tablet (5 mg total) by mouth at bedtime., Disp: 30 tablet, Rfl:  0 .  SUMAtriptan (IMITREX) 50 MG tablet, Take 1 tablet (50 mg total) by mouth every 2 (two) hours as needed for migraine. May repeat in 2 hours if headache persists or recurs., Disp: 10 tablet, Rfl: 0  Axis Diagnosis:  Major depression, recurrent,severe without psychotic thoughts.  Rule out personality disorder   Level of Care:  IOP  Discharge destination:  Other:  inpatient hospital recommended to treat depression and suicidal thoughts  Is patient on multiple antipsychotic therapies at discharge:  No    Has Patient had three or more failed trials of antipsychotic monotherapy by history:  Negative  Patient phone:  [phone] (home)  Patient address:   [address] [place] [address],   Follow-up recommendations:  Activity:  continue current activity Diet:  encouraged to at least eat  Comments:  Needs inpatient treatment  The patient received suicide prevention pamphlet:  No Belongings returned:  na Carolanne Grumbling 07/03/2015, 9:46 AM

## 2015-07-04 ENCOUNTER — Other Ambulatory Visit (HOSPITAL_COMMUNITY): Payer: Medicare Other

## 2015-07-07 ENCOUNTER — Other Ambulatory Visit (HOSPITAL_COMMUNITY): Payer: Medicare Other

## 2015-07-08 ENCOUNTER — Other Ambulatory Visit (HOSPITAL_COMMUNITY): Payer: Medicare Other

## 2015-07-09 ENCOUNTER — Other Ambulatory Visit (HOSPITAL_COMMUNITY): Payer: Medicare Other

## 2015-07-10 ENCOUNTER — Other Ambulatory Visit (HOSPITAL_COMMUNITY): Payer: Medicare Other

## 2015-07-10 DIAGNOSIS — F332 Major depressive disorder, recurrent severe without psychotic features: Secondary | ICD-10-CM | POA: Diagnosis not present

## 2015-07-10 DIAGNOSIS — Z915 Personal history of self-harm: Secondary | ICD-10-CM | POA: Diagnosis not present

## 2015-07-10 DIAGNOSIS — R45851 Suicidal ideations: Secondary | ICD-10-CM | POA: Diagnosis not present

## 2015-07-10 DIAGNOSIS — F99 Mental disorder, not otherwise specified: Secondary | ICD-10-CM | POA: Diagnosis not present

## 2015-07-10 DIAGNOSIS — S14106S Unspecified injury at C6 level of cervical spinal cord, sequela: Secondary | ICD-10-CM | POA: Diagnosis not present

## 2015-07-10 DIAGNOSIS — F319 Bipolar disorder, unspecified: Secondary | ICD-10-CM | POA: Diagnosis not present

## 2015-07-10 DIAGNOSIS — F419 Anxiety disorder, unspecified: Secondary | ICD-10-CM | POA: Diagnosis not present

## 2015-07-10 DIAGNOSIS — F329 Major depressive disorder, single episode, unspecified: Secondary | ICD-10-CM | POA: Diagnosis not present

## 2015-07-10 DIAGNOSIS — F603 Borderline personality disorder: Secondary | ICD-10-CM | POA: Diagnosis not present

## 2015-07-11 ENCOUNTER — Other Ambulatory Visit (HOSPITAL_COMMUNITY): Payer: Medicare Other

## 2015-07-14 ENCOUNTER — Other Ambulatory Visit (HOSPITAL_COMMUNITY): Payer: Medicare Other

## 2015-07-15 ENCOUNTER — Other Ambulatory Visit (HOSPITAL_COMMUNITY): Payer: Medicare Other

## 2015-07-16 ENCOUNTER — Other Ambulatory Visit (HOSPITAL_COMMUNITY): Payer: Medicare Other

## 2015-07-17 ENCOUNTER — Other Ambulatory Visit (HOSPITAL_COMMUNITY): Payer: Medicare Other

## 2015-07-18 ENCOUNTER — Other Ambulatory Visit (HOSPITAL_COMMUNITY): Payer: Medicare Other

## 2015-07-21 ENCOUNTER — Other Ambulatory Visit (HOSPITAL_COMMUNITY): Payer: Medicare Other

## 2015-07-22 ENCOUNTER — Other Ambulatory Visit (HOSPITAL_COMMUNITY): Payer: Medicare Other

## 2015-07-23 ENCOUNTER — Other Ambulatory Visit (HOSPITAL_COMMUNITY): Payer: Medicare Other

## 2015-07-24 ENCOUNTER — Other Ambulatory Visit (HOSPITAL_COMMUNITY): Payer: Medicare Other

## 2015-07-25 ENCOUNTER — Other Ambulatory Visit (HOSPITAL_COMMUNITY): Payer: Medicare Other

## 2015-07-25 DIAGNOSIS — F338 Other recurrent depressive disorders: Secondary | ICD-10-CM | POA: Diagnosis not present

## 2015-07-25 DIAGNOSIS — F332 Major depressive disorder, recurrent severe without psychotic features: Secondary | ICD-10-CM | POA: Diagnosis not present

## 2015-07-28 ENCOUNTER — Other Ambulatory Visit (HOSPITAL_COMMUNITY): Payer: Medicare Other

## 2015-07-28 DIAGNOSIS — F322 Major depressive disorder, single episode, severe without psychotic features: Secondary | ICD-10-CM | POA: Diagnosis not present

## 2015-07-28 DIAGNOSIS — F332 Major depressive disorder, recurrent severe without psychotic features: Secondary | ICD-10-CM | POA: Diagnosis not present

## 2015-07-29 ENCOUNTER — Other Ambulatory Visit (HOSPITAL_COMMUNITY): Payer: Medicare Other

## 2015-07-30 ENCOUNTER — Other Ambulatory Visit (HOSPITAL_COMMUNITY): Payer: Medicare Other

## 2015-07-31 ENCOUNTER — Other Ambulatory Visit (HOSPITAL_COMMUNITY): Payer: Medicare Other

## 2015-08-01 ENCOUNTER — Other Ambulatory Visit (HOSPITAL_COMMUNITY): Payer: Medicare Other

## 2015-08-04 ENCOUNTER — Other Ambulatory Visit (HOSPITAL_COMMUNITY): Payer: Medicare Other

## 2015-08-05 ENCOUNTER — Other Ambulatory Visit (HOSPITAL_COMMUNITY): Payer: Medicare Other

## 2015-08-06 ENCOUNTER — Other Ambulatory Visit (HOSPITAL_COMMUNITY): Payer: Medicare Other

## 2015-08-07 ENCOUNTER — Other Ambulatory Visit (HOSPITAL_COMMUNITY): Payer: Medicare Other

## 2015-08-08 ENCOUNTER — Other Ambulatory Visit (HOSPITAL_COMMUNITY): Payer: Medicare Other

## 2015-08-11 ENCOUNTER — Other Ambulatory Visit (HOSPITAL_COMMUNITY): Payer: Medicare Other

## 2015-08-12 ENCOUNTER — Other Ambulatory Visit (HOSPITAL_COMMUNITY): Payer: Medicare Other

## 2015-08-13 ENCOUNTER — Other Ambulatory Visit (HOSPITAL_COMMUNITY): Payer: Medicare Other

## 2015-08-14 ENCOUNTER — Other Ambulatory Visit (HOSPITAL_COMMUNITY): Payer: Medicare Other

## 2015-08-15 ENCOUNTER — Other Ambulatory Visit (HOSPITAL_COMMUNITY): Payer: Medicare Other

## 2015-08-21 DIAGNOSIS — F331 Major depressive disorder, recurrent, moderate: Secondary | ICD-10-CM | POA: Diagnosis not present

## 2015-08-22 DIAGNOSIS — F332 Major depressive disorder, recurrent severe without psychotic features: Secondary | ICD-10-CM | POA: Diagnosis not present

## 2015-08-25 DIAGNOSIS — F332 Major depressive disorder, recurrent severe without psychotic features: Secondary | ICD-10-CM | POA: Diagnosis not present

## 2015-09-07 ENCOUNTER — Ambulatory Visit (HOSPITAL_COMMUNITY)
Admission: RE | Admit: 2015-09-07 | Discharge: 2015-09-07 | Disposition: A | Discharge status: Home / Self Care | Payer: Medicare Other | Source: Home / Self Care | Attending: Psychiatry | Admitting: Psychiatry

## 2015-09-07 ENCOUNTER — Encounter (HOSPITAL_COMMUNITY): Payer: Self-pay | Admitting: Emergency Medicine

## 2015-09-07 ENCOUNTER — Emergency Department (HOSPITAL_COMMUNITY)
Admission: EM | Admit: 2015-09-07 | Discharge: 2015-09-07 | Disposition: A | Discharge status: Home / Self Care | Payer: Medicare Other | Attending: Emergency Medicine | Admitting: Emergency Medicine

## 2015-09-07 DIAGNOSIS — Z79899 Other long term (current) drug therapy: Secondary | ICD-10-CM | POA: Insufficient documentation

## 2015-09-07 DIAGNOSIS — F32A Depression, unspecified: Secondary | ICD-10-CM

## 2015-09-07 DIAGNOSIS — Z87828 Personal history of other (healed) physical injury and trauma: Secondary | ICD-10-CM | POA: Diagnosis not present

## 2015-09-07 DIAGNOSIS — F332 Major depressive disorder, recurrent severe without psychotic features: Secondary | ICD-10-CM | POA: Insufficient documentation

## 2015-09-07 DIAGNOSIS — F329 Major depressive disorder, single episode, unspecified: Secondary | ICD-10-CM | POA: Insufficient documentation

## 2015-09-07 DIAGNOSIS — Z3202 Encounter for pregnancy test, result negative: Secondary | ICD-10-CM | POA: Diagnosis not present

## 2015-09-07 DIAGNOSIS — R45851 Suicidal ideations: Secondary | ICD-10-CM | POA: Diagnosis not present

## 2015-09-07 LAB — COMPREHENSIVE METABOLIC PANEL
ALT: 17 U/L (ref 14–54)
AST: 24 U/L (ref 15–41)
Albumin: 4.4 g/dL (ref 3.5–5.0)
Alkaline Phosphatase: 67 U/L (ref 38–126)
Anion gap: 8 (ref 5–15)
BUN: 8 mg/dL (ref 6–20)
CO2: 21 mmol/L — ABNORMAL LOW (ref 22–32)
Calcium: 9 mg/dL (ref 8.9–10.3)
Chloride: 107 mmol/L (ref 101–111)
Creatinine, Ser: 0.62 mg/dL (ref 0.44–1.00)
GFR calc Af Amer: >60 mL/min (ref >60)
GFR calc non Af Amer: >60 mL/min (ref >60)
Glucose, Bld: 88 mg/dL (ref 65–99)
Potassium: 3.9 mmol/L (ref 3.5–5.1)
Sodium: 136 mmol/L (ref 135–145)
Total Bilirubin: 0.8 mg/dL (ref 0.3–1.2)
Total Protein: 7.5 g/dL (ref 6.5–8.1)

## 2015-09-07 LAB — ACETAMINOPHEN LEVEL: Acetaminophen (Tylenol), Serum: <10 ug/mL — ABNORMAL LOW (ref 10–30)

## 2015-09-07 LAB — CBC
HCT: 42 % (ref 36.0–46.0)
Hemoglobin: 14 g/dL (ref 12.0–15.0)
MCH: 28.6 pg (ref 26.0–34.0)
MCHC: 33.3 g/dL (ref 30.0–36.0)
MCV: 85.7 fL (ref 78.0–100.0)
Platelets: 253 10*3/uL (ref 150–400)
RBC: 4.9 MIL/uL (ref 3.87–5.11)
RDW: 14 % (ref 11.5–15.5)
WBC: 7.9 10*3/uL (ref 4.0–10.5)

## 2015-09-07 LAB — I-STAT BETA HCG BLOOD, ED (MC, WL, AP ONLY): I-stat hCG, quantitative: <5 m[IU]/mL (ref <5)

## 2015-09-07 LAB — ETHANOL: Alcohol, Ethyl (B): <5 mg/dL (ref <5)

## 2015-09-07 LAB — SALICYLATE LEVEL: Salicylate Lvl: <4 mg/dL (ref 2.8–30.0)

## 2015-09-07 MED ORDER — BUPROPION HCL ER (XL) 300 MG PO TB24
300.0000 mg | ORAL_TABLET | Freq: Every day | ORAL | Status: DC
  Administered 2015-09-07: 300 mg via ORAL
  Filled 2015-09-07: qty 1

## 2015-09-07 MED ORDER — IBUPROFEN 200 MG PO TABS: 600.0000 mg | ORAL_TABLET | Freq: Three times a day (TID) | ORAL | Status: DC | PRN

## 2015-09-07 MED ORDER — LORAZEPAM 1 MG PO TABS: 1.0000 mg | ORAL_TABLET | Freq: Three times a day (TID) | ORAL | Status: DC | PRN

## 2015-09-07 MED ORDER — OLANZAPINE 5 MG PO TBDP: 5.0000 mg | ORAL_TABLET | Freq: Every day | ORAL | Status: DC

## 2015-09-07 MED ORDER — HYDROXYZINE HCL 25 MG PO TABS: 50.0000 mg | ORAL_TABLET | Freq: Three times a day (TID) | ORAL | Status: DC | PRN

## 2015-09-07 MED ORDER — CHOLECALCIFEROL 50 MCG (2000 UT) PO CAPS: 2000.0000 [IU] | ORAL_CAPSULE | Freq: Every day | ORAL | Status: DC

## 2015-09-07 MED ORDER — ONDANSETRON HCL 4 MG PO TABS: 4.0000 mg | ORAL_TABLET | Freq: Three times a day (TID) | ORAL | Status: DC | PRN

## 2015-09-07 MED ORDER — VITAMIN D3 25 MCG (1000 UNIT) PO TABS
2000.0000 [IU] | ORAL_TABLET | Freq: Every day | ORAL | Status: DC
  Administered 2015-09-07: 2000 [IU] via ORAL
  Filled 2015-09-07: qty 2

## 2015-09-07 MED ORDER — ZOLPIDEM TARTRATE 5 MG PO TABS: 5.0000 mg | ORAL_TABLET | Freq: Every evening | ORAL | Status: DC | PRN

## 2015-09-07 MED ORDER — SUMATRIPTAN SUCCINATE 50 MG PO TABS
50.0000 mg | ORAL_TABLET | ORAL | Status: DC | PRN
  Filled 2015-09-07: qty 1

## 2015-09-07 NOTE — ED Notes (Signed)
Bed: WA31 Expected date:  Expected time:  Means of arrival:  Comments: 

## 2015-09-07 NOTE — BH Assessment (Signed)
Tele Assessment Note   [name] is an 44 y.o. female.   Axis I: Major Depression, Recurrent severe Axis II: Deferred Axis III:  Past Medical History  Diagnosis Date  . C6 spinal cord injury   . Depression    Axis IV: other psychosocial or environmental problems, problems related to social environment and problems with access to health care services Axis V: 31-40 impairment in reality testing  Past Medical History:  Past Medical History  Diagnosis Date  . C6 spinal cord injury   . Depression     Past Surgical History  Procedure Laterality Date  . Back surgery      Family History:  Family History  Problem Relation Age of Onset  . Alcohol abuse Father   . Alcohol abuse Brother     Social History:  reports that she has never smoked. She does not have any smokeless tobacco history on file. She reports that she does not drink alcohol or use illicit drugs.  Additional Social History:  Alcohol / Drug Use Pain Medications: Pt denies  Prescriptions: Zyprexa, Klonopin, Wellbutrin Over the Counter: Pt denies History of alcohol / drug use?: No history of alcohol / drug abuse Longest period of sobriety (when/how long): NA  CIWA:   COWS:    PATIENT STRENGTHS: (choose at least two) Average or above average intelligence Communication skills  Allergies: No Known Allergies  Home Medications:  (Not in a hospital admission)  OB/GYN Status:  No LMP recorded.  General Assessment Data Location of Assessment: [facility] TTS Assessment: In system Is this a Tele or Face-to-Face Assessment?: Face-to-Face Is this an Initial Assessment or a Re-assessment for this encounter?: Initial Assessment Marital status: Single Maiden name: Boruff Is patient pregnant?: No Pregnancy Status: No Living Arrangements: Parent Can pt return to current living arrangement?: Yes Admission Status: Voluntary Is patient capable of signing voluntary admission?: Yes Referral Source:  Self/Family/Friend Insurance type: Medicare     Crisis Care Plan Living Arrangements: Parent Name of Psychiatrist: NA Name of Therapist: NA  Education Status Is patient currently in school?: No Current Grade: NA Highest grade of school patient has completed: BA Name of school: NA Contact person: NA  Risk to self with the past 6 months Suicidal Ideation: Yes-Currently Present Has patient been a risk to self within the past 6 months prior to admission? : Yes Suicidal Intent: Yes-Currently Present Has patient had any suicidal intent within the past 6 months prior to admission? : Yes Is patient at risk for suicide?: Yes Suicidal Plan?: Yes-Currently Present Has patient had any suicidal plan within the past 6 months prior to admission? : Yes Specify Current Suicidal Plan: Pt reports multiple Access to Means: Yes Specify Access to Suicidal Means: Pt states I think of ways daily What has been your use of drugs/alcohol within the last 12 months?: NA Previous Attempts/Gestures: Yes How many times?: 20 Other Self Harm Risks: cutting Triggers for Past Attempts: Unpredictable Intentional Self Injurious Behavior: Cutting Comment - Self Injurious Behavior: Pt reports cutting Family Suicide History: Yes Recent stressful life event(s): Other (Comment) (Unkn[place]w) Persecutory voices/beliefs?: No Depression: Yes Depression Symptoms: Despondent, Insomnia, Tearfulness, Isolating, Fatigue, Guilt, Loss of interest in usual pleasures, Feeling worthless/self pity, Feeling angry/irritable Substance abuse history and/or treatment for substance abuse?: No Suicide prevention information given to non-admitted patients: Not applicable  Risk to Others within the past 6 months Homicidal Ideation: No Does patient have any lifetime risk of violence toward others beyond the six months  prior to admission? : No Thoughts of Harm to Others: No Current Homicidal Intent: No Current Homicidal Plan: No Access to  Homicidal Means: No Identified Victim: NA History of harm to others?: No Assessment of Violence: None Noted Violent Behavior Description: NA Does patient have access to weapons?: No Criminal Charges Pending?: No Does patient have a court date: No Is patient on probation?: No  Psychosis Hallucinations: None noted Delusions: None noted  Mental Status Report Appearance/Hygiene: Unremarkable Eye Contact: Fair Motor Activity: Freedom of movement Speech: Logical/coherent Level of Consciousness: Alert Mood: Depressed Affect: Depressed Anxiety Level: Moderate Thought Processes: Coherent, Relevant Judgement: Unimpaired Orientation: Person, Place, Time, Situation, Appropriate for developmental age Obsessive Compulsive Thoughts/Behaviors: None  Cognitive Functioning Concentration: Normal Memory: Recent Intact, Remote Intact IQ: Average Insight: Fair Impulse Control: Fair Appetite: Poor Weight Loss: 0 Weight Gain: 0 Sleep: Decreased Total Hours of Sleep: 4 Vegetative Symptoms: None  ADLScreening [facility] Assessment Services) Patient's cognitive ability adequate to safely complete daily activities?: No Patient able to express need for assistance with ADLs?: Yes Independently performs ADLs?: No  Prior Inpatient Therapy Prior Inpatient Therapy: Yes Prior Therapy Dates: 30+ Prior Therapy Facilty/Provider(s): Multiple Reason for Treatment: MDD  Prior Outpatient Therapy Prior Outpatient Therapy: Yes Prior Therapy Dates: 2016 Prior Therapy Facilty/Provider(s): [facility] Reason for Treatment: MDD Does patient have an ACCT team?: No Does patient have Intensive In-House Services?  : No Does patient have [facility] services? : No Does patient have P4CC services?: No  ADL Screening (condition at time of admission) Patient's cognitive ability adequate to safely complete daily activities?: No Is the patient deaf or have difficulty hearing?: No Does the patient have difficulty  seeing, even when wearing glasses/contacts?: No Does the patient have difficulty concentrating, remembering, or making decisions?: No Patient able to express need for assistance with ADLs?: Yes Does the patient have difficulty dressing or bathing?: No Independently performs ADLs?: No Does the patient have difficulty walking or climbing stairs?: No Weakness of Legs: None Weakness of Arms/Hands: None       Abuse/Neglect Assessment (Assessment to be complete while patient is alone) Physical Abuse: Denies Verbal Abuse: Denies Sexual Abuse: Denies Exploitation of patient/patient's resources: Denies Self-Neglect: Denies Values / Beliefs Cultural Requests During Hospitalization: None Spiritual Requests During Hospitalization: None   Advance Directives (For Healthcare) Does patient have an advance directive?: No Would patient like information on creating an advanced directive?: No - patient declined information    Additional Information 1:1 In Past 12 Months?: No CIRT Risk: No Elopement Risk: No Does patient have medical clearance?: No     Disposition:  Disposition Initial Assessment Completed for this Encounter: Yes Disposition of Patient: Inpatient treatment program Type of inpatient treatment program: Adult  Emmit Pomfret 09/07/2015 3:49 PM

## 2015-09-07 NOTE — ED Provider Notes (Signed)
CSN: [id number]     Arrival date & time 09/07/15  1625 History   First MD Initiated Contact with Patient 09/07/15 1656     Chief Complaint  Patient presents with  . Depression  . Suicidal     (Consider location/radiation/quality/duration/timing/severity/associated sxs/prior Treatment) HPI Comments: 44 year old female with a past medical history of depression presenting from Behavioral Health for medical clearance. Patient reports ongoing depression for many years, chronic suicidal thoughts. For a while, her symptoms were well controlled until a few months ago when she was admitted. After the admission, she was well controlled and started to feel much better until recently her depression has returned. She presented today to behavioral health with return of depression and suicidal thoughts, told him that she felt scared and they advised her to go to the emergency department. She is supposed to see an outpatient psychiatrist in 3 days for intensive outpatient therapy. Ideally she would like to have outpatient therapy and have her medications straightened out. At her last admission, states she was "jumped around" from facility to facility and does not want to have to go through that again. Denies any suicide attempts. Denies alcohol or drug use. No other complaints at this time.  Patient is a 44 y.o. female presenting with depression. The history is provided by the patient.  Depression This is a chronic problem. The current episode started more than 1 year ago. The problem occurs constantly. The problem has been gradually worsening.    Past Medical History  Diagnosis Date  . C6 spinal cord injury   . Depression    Past Surgical History  Procedure Laterality Date  . Back surgery     Family History  Problem Relation Age of Onset  . Alcohol abuse Father   . Alcohol abuse Brother    Social History  Substance Use Topics  . Smoking status: Never Smoker   . Smokeless tobacco: None  . Alcohol  Use: No   OB History    No data available     Review of Systems  Psychiatric/Behavioral: Positive for depression, suicidal ideas and dysphoric mood. The patient is nervous/anxious.   All other systems reviewed and are negative.     Allergies  Review of patient's allergies indicates no known allergies.  Home Medications   Prior to Admission medications   Medication Sig Start Date End Date Taking? Authorizing Provider  buPROPion (WELLBUTRIN XL) 300 MG 24 hr tablet Take 300 mg by mouth daily. 07/24/15  Yes Historical Provider, MD  Cholecalciferol (PA VITAMIN D-3) 2000 UNITS CAPS Take 1 tablet by mouth daily. 07/23/15  Yes Historical Provider, MD  clonazePAM (KLONOPIN) 2 MG tablet Take 1 tablet (2 mg total) by mouth at bedtime. 06/25/15  Yes Shuvon B Rankin, NP  cyclobenzaprine (FLEXERIL) 10 MG tablet Take 10 mg by mouth 2 (two) times daily as needed for muscle spasms.  06/26/15  Yes Historical Provider, MD  hydrOXYzine (ATARAX/VISTARIL) 50 MG tablet Take 1 tablet (50 mg total) by mouth 3 (three) times daily as needed for anxiety. 06/25/15  Yes Shuvon B Rankin, NP  OLANZapine zydis (ZYPREXA) 5 MG disintegrating tablet Take 1 tablet (5 mg total) by mouth at bedtime. 06/25/15  Yes Shuvon B Rankin, NP  SUMAtriptan (IMITREX) 50 MG tablet Take 1 tablet (50 mg total) by mouth every 2 (two) hours as needed for migraine. May repeat in 2 hours if headache persists or recurs. 06/25/15  Yes Shuvon B Rankin, NP  buPROPion (WELLBUTRIN XL) 150 MG 24  hr tablet Take 1 tablet (150 mg total) by mouth daily. Patient not taking: Reported on 09/07/2015 06/25/15   Shuvon B Rankin, NP  lithium carbonate 300 MG capsule Take 1 capsule (300 mg total) by mouth 2 (two) times daily with a meal. Patient not taking: Reported on 09/07/2015 06/25/15   Shuvon B Rankin, NP   BP 130/91 mmHg  Pulse 84  Temp(Src) 98 F (36.7 C) (Oral)  Resp 16  SpO2 100%  LMP 08/07/2015 (Approximate) Physical Exam  Constitutional: She is  oriented to person, place, and time. She appears well-developed and well-nourished. No distress.  HENT:  Head: Normocephalic and atraumatic.  Mouth/Throat: Oropharynx is clear and moist.  Eyes: Conjunctivae and EOM are normal.  Neck: Normal range of motion. Neck supple.  Cardiovascular: Normal rate, regular rhythm and normal heart sounds.   Pulmonary/Chest: Effort normal and breath sounds normal. No respiratory distress.  Musculoskeletal: Normal range of motion. She exhibits no edema.  Neurological: She is alert and oriented to person, place, and time. No sensory deficit.  Skin: Skin is warm and dry.  Psychiatric: Her speech is normal and behavior is normal. She exhibits a depressed mood. She expresses suicidal ideation. She expresses no homicidal ideation. She expresses no suicidal plans.  Nursing note and vitals reviewed.   ED Course  Procedures (including critical care time) Labs Review Labs Reviewed  COMPREHENSIVE METABOLIC PANEL - Abnormal; Notable for the following:    CO2 21 (*)    All other components within normal limits  ACETAMINOPHEN LEVEL - Abnormal; Notable for the following:    Acetaminophen (Tylenol), Serum <10 (*)    All other components within normal limits  ETHANOL  SALICYLATE LEVEL  CBC  URINE RAPID DRUG SCREEN, HOSP PERFORMED  I-STAT BETA HCG BLOOD, ED ([facility], [facility], [facility] ONLY)    Imaging Review No results found. I have personally reviewed and evaluated these images and lab results as part of my medical decision-making.   EKG Interpretation None      MDM   Final diagnoses:  Depression   Non-toxic appearing, NAD. AFVSS. Chronic depression and chronic suicidal ideations without plan. Has close outpatient follow-up. Sent here from behavioral health for medical clearance for inpatient treatment. Medically cleared. It is noted patient's mother had mentioned to nursing that the patient would prefer to follow outpatient. Seen by Dr. Denton Lank, contracted for safety and  was discharged home.  [name] Speed, PA-C 09/07/15 1958  Cathren Laine, MD 09/07/15 2125

## 2015-09-07 NOTE — ED Notes (Signed)
Pt states that she has had an ongoing problem with depression and chronic suicidal thoughts and went to [facility] today to get continuity of care and told them she is suicidal so they sent her here for medical clearance and to see a counselor. Pt denies any active thoughts of wanting to act on her suicide. States that she simply wants to get back in with [facility] outpatient therapy and get her meds straightened out. Does not want to go to another facility. Alert and oriented.

## 2015-09-07 NOTE — Discharge Instructions (Signed)
It was our pleasure to provide your ER care today - we hope that you feel better.  Follow up with your therapist/psychiatrist as planned.  Return to ER right away if worse, worsening or severe depression, thoughts of self harm, medical emergency, other concern.      Depression Depression refers to feeling sad, low, down in the dumps, blue, gloomy, or empty. In general, there are two kinds of depression: 1. Normal sadness or normal grief. This kind of depression is one that we all feel from time to time after upsetting life experiences, such as the loss of a job or the ending of a relationship. This kind of depression is considered normal, is short lived, and resolves within a few days to 2 weeks. Depression experienced after the loss of a loved one (bereavement) often lasts longer than 2 weeks but normally gets better with time. 2. Clinical depression. This kind of depression lasts longer than normal sadness or normal grief or interferes with your ability to function at home, at work, and in school. It also interferes with your personal relationships. It affects almost every aspect of your life. Clinical depression is an illness. Symptoms of depression can also be caused by conditions other than those mentioned above, such as:  Physical illness. Some physical illnesses, including underactive thyroid gland (hypothyroidism), severe anemia, specific types of cancer, diabetes, uncontrolled seizures, heart and lung problems, strokes, and chronic pain are commonly associated with symptoms of depression.  Side effects of some prescription medicine. In some people, certain types of medicine can cause symptoms of depression.  Substance abuse. Abuse of alcohol and illicit drugs can cause symptoms of depression. SYMPTOMS Symptoms of normal sadness and normal grief include the following:  Feeling sad or crying for short periods of time.  Not caring about anything (apathy).  Difficulty sleeping or  sleeping too much.  No longer able to enjoy the things you used to enjoy.  Desire to be by oneself all the time (social isolation).  Lack of energy or motivation.  Difficulty concentrating or remembering.  Change in appetite or weight.  Restlessness or agitation. Symptoms of clinical depression include the same symptoms of normal sadness or normal grief and also the following symptoms:  Feeling sad or crying all the time.  Feelings of guilt or worthlessness.  Feelings of hopelessness or helplessness.  Thoughts of suicide or the desire to harm yourself (suicidal ideation).  Loss of touch with reality (psychotic symptoms). Seeing or hearing things that are not real (hallucinations) or having false beliefs about your life or the people around you (delusions and paranoia). DIAGNOSIS  The diagnosis of clinical depression is usually based on how bad the symptoms are and how long they have lasted. Your health care provider will also ask you questions about your medical history and substance use to find out if physical illness, use of prescription medicine, or substance abuse is causing your depression. Your health care provider may also order blood tests. TREATMENT  Often, normal sadness and normal grief do not require treatment. However, sometimes antidepressant medicine is given for bereavement to ease the depressive symptoms until they resolve. The treatment for clinical depression depends on how bad the symptoms are but often includes antidepressant medicine, counseling with a mental health professional, or both. Your health care provider will help to determine what treatment is best for you. Depression caused by physical illness usually goes away with appropriate medical treatment of the illness. If prescription medicine is causing depression, talk with  your health care provider about stopping the medicine, decreasing the dose, or changing to another medicine. Depression caused by the  abuse of alcohol or illicit drugs goes away when you stop using these substances. Some adults need professional help in order to stop drinking or using drugs. SEEK IMMEDIATE MEDICAL CARE IF:  You have thoughts about hurting yourself or others.  You lose touch with reality (have psychotic symptoms).  You are taking medicine for depression and have a serious side effect. FOR MORE INFORMATION  National Alliance on Mental Illness: www.nami.[employer] of Mental Health: http://www.maynard.net/ Document Released: 11/26/2000 Document Revised: 04/15/2014 Document Reviewed: 02/28/2012 [facility] Patient Information 2015 [place], [place]. This information is not intended to replace advice given to you by your health care provider. Make sure you discuss any questions you have with your health care provider.

## 2015-09-07 NOTE — ED Notes (Signed)
Mother at desk and reported that the pt does not need to stay overnight and she plans to take care of her home and will take full responsibility of pt care. Spoken to [facility] counselor concerning pt's and family concerns. He reported that they will not be able to speak with the psych MD until tomorrow. Pt and mother made aware.

## 2015-09-07 NOTE — ED Provider Notes (Signed)
Patient indicates she has long hx depression, and chronic suicidal thoughts.  She indicates had come in with interest of discussion coordinating care between therapist, psychiatrist, including her outpt meds and ect tx.   Patient now indicates that she does not want to stay in ED overnight to see psychiatrist, and that she already has outpt appt w her therapist in the next day, indicating she will follow up there tomorrow.  She does not feel she needs a new assessment, was just trying to better coordinate her care.  Pts mother verifies pts story.  Patient requests d/c to home, she denies any current thoughts of harm to self.  Neither mother nor pt feel she is danger to self, both verbally contract for safety, and commit to following up tomorrow.   Pt exhibits normal mood/affect. Denies SI.  Pt currently appears stable for d/c.      Cathren Laine, MD 09/07/15 [phone]

## 2015-09-11 ENCOUNTER — Inpatient Hospital Stay (HOSPITAL_COMMUNITY)
Admission: AD | Admit: 2015-09-11 | Discharge: 2015-09-25 | DRG: 885 | Disposition: A | Discharge status: Home / Self Care | Payer: Medicare Other | Attending: Psychiatry | Admitting: Psychiatry

## 2015-09-11 ENCOUNTER — Encounter (HOSPITAL_COMMUNITY): Payer: Self-pay

## 2015-09-11 ENCOUNTER — Other Ambulatory Visit (HOSPITAL_COMMUNITY): Payer: Medicare Other | Admitting: Licensed Clinical Social Worker

## 2015-09-11 ENCOUNTER — Encounter (HOSPITAL_COMMUNITY): Payer: Self-pay | Admitting: Licensed Clinical Social Worker

## 2015-09-11 DIAGNOSIS — G4700 Insomnia, unspecified: Secondary | ICD-10-CM | POA: Diagnosis not present

## 2015-09-11 DIAGNOSIS — F332 Major depressive disorder, recurrent severe without psychotic features: Secondary | ICD-10-CM | POA: Diagnosis not present

## 2015-09-11 DIAGNOSIS — R45851 Suicidal ideations: Secondary | ICD-10-CM

## 2015-09-11 DIAGNOSIS — Z811 Family history of alcohol abuse and dependence: Secondary | ICD-10-CM | POA: Diagnosis not present

## 2015-09-11 DIAGNOSIS — F410 Panic disorder [episodic paroxysmal anxiety] without agoraphobia: Secondary | ICD-10-CM | POA: Diagnosis present

## 2015-09-11 DIAGNOSIS — Z820 Family history of epilepsy and other diseases of the nervous system: Secondary | ICD-10-CM | POA: Diagnosis not present

## 2015-09-11 DIAGNOSIS — R630 Anorexia: Secondary | ICD-10-CM | POA: Diagnosis present

## 2015-09-11 MED ORDER — CLONAZEPAM 1 MG PO TABS
2.0000 mg | ORAL_TABLET | Freq: Every day | ORAL | Status: DC
  Administered 2015-09-11 – 2015-09-24 (×14): 2 mg via ORAL
  Filled 2015-09-11 (×14): qty 2

## 2015-09-11 MED ORDER — ALUM & MAG HYDROXIDE-SIMETH 200-200-20 MG/5ML PO SUSP: 30.0000 mL | ORAL | Status: DC | PRN

## 2015-09-11 MED ORDER — ACETAMINOPHEN 325 MG PO TABS
650.0000 mg | ORAL_TABLET | Freq: Four times a day (QID) | ORAL | Status: DC | PRN
  Administered 2015-09-13 – 2015-09-21 (×7): 650 mg via ORAL
  Filled 2015-09-11 (×7): qty 2

## 2015-09-11 MED ORDER — MAGNESIUM HYDROXIDE 400 MG/5ML PO SUSP: 30.0000 mL | Freq: Every day | ORAL | Status: DC | PRN

## 2015-09-11 MED ORDER — BUPROPION HCL ER (XL) 150 MG PO TB24
150.0000 mg | ORAL_TABLET | Freq: Every day | ORAL | Status: DC
  Administered 2015-09-11 – 2015-09-12 (×2): 150 mg via ORAL
  Filled 2015-09-11 (×5): qty 1

## 2015-09-11 MED ORDER — LITHIUM CARBONATE 150 MG PO CAPS
150.0000 mg | ORAL_CAPSULE | Freq: Two times a day (BID) | ORAL | Status: DC
  Administered 2015-09-11 – 2015-09-16 (×11): 150 mg via ORAL
  Filled 2015-09-11 (×15): qty 1

## 2015-09-11 MED ORDER — METHOCARBAMOL 750 MG PO TABS
750.0000 mg | ORAL_TABLET | Freq: Four times a day (QID) | ORAL | Status: DC | PRN
  Filled 2015-09-11 (×2): qty 1

## 2015-09-11 MED ORDER — HYDROXYZINE HCL 50 MG PO TABS
50.0000 mg | ORAL_TABLET | Freq: Three times a day (TID) | ORAL | Status: DC | PRN
  Administered 2015-09-12 – 2015-09-25 (×13): 50 mg via ORAL
  Filled 2015-09-11 (×15): qty 1

## 2015-09-11 NOTE — Psych (Signed)
Natasha [name] is a 44 y.o. female patient who came for appointment for PHP.  Therapist explained that PHP has been a program focus on college age students ages 86 to 46 years old and it was determined by the [facility] doctor that she would not meet criteria. Patient refused PHP referral to [facility] and she does not think PHP is what she needs.  She explains that her suicide thoughts are worse and she is scared due to worsening depression. She believes that she needs inpatient. She has researched on the internet about obtaining cyanide pills. She said that her c-spine injury is related to a past suicide attempt. She explains that there can be no room for error since this would cause problems especially since she she lives with her mom who has Parkinson's disease. In thinking about suicide, she thinks about where she would be and who would find her. Patient explains that her depression has worsened due to her life situation. She does not know what is going to happen with her mom and she believes she should be more compassionate but she does not know how to be more compassionate. She feels that she has run into roadblocks with the system and that going from place to place for treatment has made things worse. She also explains that her psychiatrist, Dr. Betti Cruz, cancelled on her first appointment with her and that she is out of Zyprexa, Lithium, Wellbutrin(not working anyway), and only still has the Klonopin for her c-spine. Her maintenance ECT is to be set up with [facility]. She believes she needs inpatient to get medications adjusted and set up a plan for support while she gets maintenance ECT. She reports history of suicide attempts by cutting. She was in IOP last August and relates that she cut her neck and was placed inpatient due to concern of harm. She said though that this was not a suicide attempt but that she was only practicing.   Plan: Patient is being referred inpatient to [facility] due to suicide  thoughts, with plan and steps she has taken in furtherance of her plan.  She has has history of suicide attempts, currently worsening depression and having either her medications not working or out of medications.  Therapist escorted patient to TSS.         Bowman,Mary A

## 2015-09-11 NOTE — Progress Notes (Signed)
Patient did not attend the evening karaoke group. Pt was notified that group was beginning but remained on unit.  

## 2015-09-11 NOTE — Tx Team (Signed)
Initial Interdisciplinary Treatment Plan   PATIENT STRESSORS: Health problems Traumatic event   PATIENT STRENGTHS: Ability for insight Active sense of humor Average or above average intelligence Communication skills Supportive family/friends   PROBLEM LIST: Problem List/Patient Goals Date to be addressed Date deferred Reason deferred Estimated date of resolution  Thoughts of cutting self 09/11/2015     Resume/adjust medications 09/11/2015                                                DISCHARGE CRITERIA:  Ability to meet basic life and health needs Improved stabilization in mood, thinking, and/or behavior  PRELIMINARY DISCHARGE PLAN: Outpatient therapy  PATIENT/FAMIILY INVOLVEMENT: This treatment plan has been presented to and reviewed with the patient, [name], and/or family member.  The patient and family have been given the opportunity to ask questions and make suggestions.  Maurine Simmering 09/11/2015, 1:27 PM

## 2015-09-11 NOTE — Progress Notes (Signed)
D: Patient in in the dayroom on approach.  Patient has affect and depressed mood.  Patient state she has been SI prior to admission with multiple plans she had been researching.  Patient complains of anxiety and cannot identify triggers.  Patient states her goal is to get better. Patient currentlydenies SI/HI/AVH and verbally contracts for safety.  A: Staff to monitor Q 15 mins for safety.  Encouragement and support offered.  Scheduled medications administered per orders. R: Patient remains safe on the unit.  Patient did not attend group tonight.  Patient visible on the unit.  Patient taking administered medications.

## 2015-09-11 NOTE — Progress Notes (Signed)
Natasha [name], 44, was admitted to [address] hall after citing thoughts of cutting herself to "bleed out" as well as just thoughts of cutting. She denies current SI and contracts for safety. She says she feels safe on unit. She denies HI/AVH/pain. She reports feeling anxious and tired. She reports running out of meds and not having an outpatient provider. Pt was discharged from here in June. Pt's skin assessment showed no break in skin integrity. Pt has a C6 spinal cord injury from 2002, which stemmed from a suicide attempt. She has a small 1-2 inch scar on her left neck from surgery, as well as some bruising to the right foot, an area where she reports poor circulation. Her fingers are contracted. She reports limited mobility and uses a cane to ambulate. She requests a wheelchair to use at night. Pt was oriented to unit and shown to room, where she is now resting with eyes closed. Will continue to monitor for needs/safety.
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
## 2015-09-11 NOTE — BHH Suicide Risk Assessment (Signed)
[facility] Admission Suicide Risk Assessment   Nursing information obtained from:   patient and chart Demographic factors:   44 year old single female, lives with mother, unemployed  Current Mental Status:   see below Loss Factors:   chronic mental illness, quadriparesia, mother has parkinson's disease  Historical Factors:   history of depression, history of severe suicide attempt in the past  Risk Reduction Factors:   resilience  Total Time spent with patient: 45 minutes Principal Problem: MDD (major depressive disorder), recurrent severe, without psychosis Diagnosis:   Patient Active Problem List   Diagnosis Date Noted  . MDD (major depressive disorder), recurrent severe, without psychosis [F33.2] 09/11/2015  . Major depressive disorder, recurrent, severe without psychotic features [F33.2]   . Severe recurrent major depression without psychotic features [F33.2] 06/19/2015     Continued Clinical Symptoms:  Alcohol Use Disorder Identification Test Final Score (AUDIT): 1 The "Alcohol Use Disorders Identification Test", Guidelines for Use in Primary Care, Second Edition.  World Science writer [facility]). Score between 0-7:  no or low risk or alcohol related problems. Score between 8-15:  moderate risk of alcohol related problems. Score between 16-19:  high risk of alcohol related problems. Score 20 or above:  warrants further diagnostic evaluation for alcohol dependence and treatment.   CLINICAL FACTORS:  Patient is a 44 year old female, well known to our service from prior psychiatric admission ( 6/27 through 06/25/15). She has had more recent psychiatric admissions at other hospitals, most recently at [facility] , where she had a total of 6 ECT treatments . She has a history of severe, recurrent MDD, without psychotic symptoms, and she has a history of chronic suicidal ideations. She does have a history of a severe, nearly fatal suicide attempt by jumping off a building years ago, resulting in  [place].  She reports she returned home ( lives with mother, who has developed Parkinson's Disease ) about 2-3 weeks ago, but has again been feeling more depressed and feeling suicidal.  She states her medications have been " changed around a lot " recently and feels this is partly causing her depression. She has a history of doing better on Lithium, Wellbutrin, Zyprexa in the past , but states tends to tolerate lower doses better and higher doses cause her to feel worse . She admits ECT has helped but that " I feel depressed soon after I stop". She denies ECT side effects or memory loss . Dx- MDD, Severe, Chronic, no psychotic features  Plan- Admit to inpatient unit - Wellbutrin XL 150 mgrs QAM, Lithium 150 mgrs BID. Consider maintenance ECT as outpatient  Treatment .     Musculoskeletal: Strength & Muscle Tone: decreased Gait & Station: slow, walks with cane  Patient leans: N/A  Psychiatric Specialty Exam: Physical Exam  ROS  Blood pressure 103/84, pulse 90, temperature 98.6 F (37 C), temperature source Oral, resp. rate 17, height 5' 1.5" (1.562 m), weight 140 lb (63.504 kg), last menstrual period 08/07/2015, SpO2 100 %.Body mass index is 26.03 kg/(m^2).  General Appearance: Fairly Groomed  Patent attorney::  Good  Speech:  Normal Rate  Volume:  Normal  Mood:  Depressed  Affect:  Constricted and but reactive   Thought Process:  Linear  Orientation:  Full (Time, Place, and Person)  Thought Content:  no hallucinations,no delusions  Suicidal Thoughts:  No- at this time denies active SI, denies plan or intention of hurting self on unit and is able to contract for safety on unit  Homicidal Thoughts:  No  Memory:  recent and remote grossly intact   Judgement:  Fair  Insight:  Present  Psychomotor Activity:  Normal  Concentration:  Good  Recall:  Good  Fund of Knowledge:Good  Language: Good  Akathisia:  Negative  Handed:  Right  AIMS (if indicated):     Assets:  Communication  Skills Resilience  Sleep:     Cognition: WNL  ADL's:   Fair      COGNITIVE FEATURES THAT CONTRIBUTE TO RISK:  Closed-mindedness and Loss of executive function    SUICIDE RISK:   Moderate:  Frequent suicidal ideation with limited intensity, and duration, some specificity in terms of plans, no associated intent, good self-control, limited dysphoria/symptomatology, some risk factors present, and identifiable protective factors, including available and accessible social support.  PLAN OF CARE: Patient will be admitted to inpatient psychiatric unit for stabilization and safety. Will provide and encourage milieu participation. Provide medication management and maked adjustments as needed.  Will follow daily.    Medical Decision Making:  Review of Psycho-Social Stressors (1), Review or order clinical lab tests (1), Established Problem, Worsening (2) and Review of Medication Regimen & Side Effects (2)  I certify that inpatient services furnished can reasonably be expected to improve the patient's condition.   COBOS, FERNANDO 09/11/2015, 6:07 PM

## 2015-09-11 NOTE — Tx Team (Signed)
Interdisciplinary Treatment Plan Update (Adult) Date: 09/11/2015   Date: 09/11/2015 1:37 PM  Progress in Treatment:  Attending groups: Yes  Participating in groups: Yes  Taking medication as prescribed: Yes  Tolerating medication: Yes  Family/Significant othe contact made: No, CSW assessing for appropriate contact Patient understands diagnosis: Yes AEB seeking help for depression Discussing patient identified problems/goals with staff: Yes  Medical problems stabilized or resolved: Yes  Denies suicidal/homicidal ideation: No, Natasha [name] recently admitted with SI Patient has not harmed self or Others: Yes   New problem(s) identified: None identified at this time.   Discharge Plan or Barriers: CSW will assess for appropriate discharge plan and relevant barriers.   Additional comments: n/a   Reason for Continuation of Hospitalization:  Anxiety Depression Medication stabilization Suicidal ideation  Estimated length of stay: 3-5 days  Review of initial/current patient goals per problem list:   1.  Goal(s): Patient will participate in aftercare plan  Met:  No  Target date: 3-5 days from date of admission   As evidenced by: Patient will participate within aftercare plan AEB aftercare provider and housing plan at discharge being identified.  09/11/15: CSW to work with Natasha [name] to assess for appropriate discharge plan and faciliate appointments and referrals as needed prior to d/c.  2.  Goal (s): Patient will exhibit decreased depressive symptoms and suicidal ideations.  Met:  No  Target date: 3-5 days from date of admission   As evidenced by: Patient will utilize self rating of depression at 3 or below and demonstrate decreased signs of depression or be deemed stable for discharge by MD. 09/11/15: Natasha [name] was admitted with symptoms of depression, rating 10/10. Natasha [name] continues to present with flat affect and depressive symptoms.  Natasha [name] will demonstrate decreased symptoms of depression and rate depression  at 3/10 or lower prior to discharge.  3.  Goal(s): Patient will demonstrate decreased signs and symptoms of anxiety.  Met:  No  Target date: 3-5 days from date of admission   As evidenced by: Patient will utilize self rating of anxiety at 3 or below and demonstrated decreased signs of anxiety, or be deemed stable for discharge by MD 09/11/15: Natasha [name] was admitted with increased levels of anxiety and is currently rating those symptoms highly. Natasha [name] will demonstrated decreased symptoms of anxiety and rate it at 3/10 prior to d/c.  Attendees:  Patient:    Family:    Physician: Dr. Parke Poisson, MD  09/11/2015 1:37 PM  Nursing: Lars Pinks, RN Case manager  09/11/2015 1:37 PM  Clinical Social Worker Norman Clay, MSW 09/11/2015 1:37 PM  Other: Lucinda Dell, Beverly Sessions Liasion 09/11/2015 1:37 PM  Clinical: Kerby Nora, RN 09/11/2015 1:37 PM  Other: , RN Charge Nurse 09/11/2015 1:37 PM  Other:     Peri Maris, Latanya Presser MSW

## 2015-09-11 NOTE — BHH Group Notes (Signed)
[facility] [employer] Group Therapy 09/11/2015 1:15pm  Type of Therapy: Mental Health Association Presentation  Participation Level: Active  Participation Quality: Attentive  Affect: Flat  Cognitive: Oriented  Insight: Developing/Improving  Engagement in Therapy: Engaged  Modes of Intervention: Discussion, Education and Socialization  Summary of Progress/Problems: Mental Health Association (MHA) Speaker came to talk about his personal journey with substance abuse and addiction. The pt processed ways by which to relate to the speaker. MHA speaker provided handouts and educational information pertaining to groups and services offered by the [facility]. Pt was engaged in speaker's presentation and was receptive to resources provided.    Chad Cordial, LCSWA 09/11/2015 1:36 PM

## 2015-09-11 NOTE — Progress Notes (Signed)
Patient ID: Natasha [name], female   DOB: March 25, 1971, 44 y.o.   MRN: [id number] PER STATE REGULATIONS 482.30  THIS CHART WAS REVIEWED FOR MEDICAL NECESSITY WITH RESPECT TO THE PATIENT'S ADMISSION/DURATION OF STAY.  NEXT REVIEW DATE:09/15/15  Loura Halt, RN, BSN CASE MANAGER

## 2015-09-12 MED ORDER — BUPROPION HCL ER (XL) 300 MG PO TB24
300.0000 mg | ORAL_TABLET | Freq: Every day | ORAL | Status: DC
  Administered 2015-09-13 – 2015-09-19 (×7): 300 mg via ORAL
  Filled 2015-09-12 (×9): qty 1

## 2015-09-12 NOTE — Progress Notes (Addendum)
D Pt done well today. Took meds. Processed with this nurse. Trying to help her understand reality vs feeings. A She  Enjoys playing cards with other pts. R Safety is in place.

## 2015-09-12 NOTE — Progress Notes (Signed)
Adult Psychoeducational Group Note  Date:  09/12/2015 Time:  9:28 PM  Group Topic/Focus:  Wrap-Up Group:   The focus of this group is to help patients review their daily goal of treatment and discuss progress on daily workbooks.  Participation Level:  Active  Participation Quality:  Appropriate and Attentive  Affect:  Appropriate  Cognitive:  Appropriate  Insight: Appropriate and Good  Engagement in Group:  Engaged  Modes of Intervention:  Education  Additional Comments:  Pt relapse prevention goal is to seek after care plan upon discharging. Pt also tends to seek outside help such as seeing a therapist or psychiatrist. Pt looks forward to building a support network.   Merlinda Frederick 09/12/2015, 9:28 PM

## 2015-09-12 NOTE — [facility] Group Notes (Signed)
[facility] LCSW Group Therapy 09/12/2015 1:15pm  Type of Therapy: Group Therapy- Feelings Around Relapse and Recovery  Participation Level: Inactive  Participation Quality:  Withdrawn/Minimal  Affect:  Flat  Cognitive: Alert and Oriented   Insight:  Developing   Engagement in Therapy: Limited   Modes of Intervention: Clarification, Confrontation, Discussion, Education, Exploration, Limit-setting, Orientation, Problem-solving, Rapport Building, Dance movement psychotherapist, Socialization and Support  Summary of Progress/Problems: The topic for today was feelings about relapse. The group discussed what relapse prevention is to them and identified triggers that they are on the path to relapse. Members also processed their feeling towards relapse and were able to relate to common experiences. Group also discussed coping skills that can be used for relapse prevention.  Pt did not participate in group therapy, was observed to be withdrawn with minimal eye contact.   Therapeutic Modalities:   Cognitive Behavioral Therapy Solution-Focused Therapy Assertiveness Training Relapse Prevention Therapy    Natasha [name] [phone] 09/12/2015 4:43 PM

## 2015-09-12 NOTE — Progress Notes (Signed)
[facility] MD Progress Note  09/12/2015 5:00 PM Natasha [name]  MRN:  [id number] Subjective:   Patient reports ongoing depression, sadness, and ruminations about her stressors . She denies medication side effects, but feels medications are helping " only a little ". Objective : I have discussed case with treatment team and have met with patient. She remains depressed, sad, but not tearful, and affect is somewhat more reactive . Smiles briefly at times.  She ruminates about her stressors- her main concern is mother's deteriorating physical health. She lives with mother, and has limited support other than her . Mother has developed Parkinson's Disease, and as per  Patient this condition has been progressing . Of note, patient does identify mother as a protective factor preventing her from suicide, as states that her mother depends on her for daily activities, dealing with finances, companionship, etc. Patient has a history of chronic suicidal ideations, which do not completely resolve even during periods of time when she is doing better. She ahs history of severe suicide attempts in the past, and is quadri-paretic following a suicide attempt by jumping. She is visible on unit, day room, and has been going to some groups. She denies medication side effects at present . We discussed Wellbutrin titration and she agrees .  Principal Problem: MDD (major depressive disorder), recurrent severe, without psychosis Diagnosis:   Patient Active Problem List   Diagnosis Date Noted  . MDD (major depressive disorder), recurrent severe, without psychosis [F33.2] 09/11/2015  . Major depressive disorder, recurrent, severe without psychotic features [F33.2]   . Severe recurrent major depression without psychotic features [F33.2] 06/19/2015   Total Time spent with patient: 25 minutes     Past Medical History:  Past Medical History  Diagnosis Date  . C6 spinal cord injury   . Depression     Past Surgical History   Procedure Laterality Date  . Back surgery     Family History:  Family History  Problem Relation Age of Onset  . Alcohol abuse Father   . Alcohol abuse Brother     Social History:  History  Alcohol Use  . Yes    Comment: One drink a week at most; rarely drinks     History  Drug Use No    Social History   Social History  . Marital Status: Single    Spouse Name: N/A  . Number of Children: N/A  . Years of Education: N/A   Social History Main Topics  . Smoking status: Never Smoker   . Smokeless tobacco: Never Used     Comment: No smoking hx; no need for cessation materials  . Alcohol Use: Yes     Comment: One drink a week at most; rarely drinks  . Drug Use: No  . Sexual Activity: No   Other Topics Concern  . None   Social History Narrative   Additional Social History:    Pain Medications: Pt denies  Prescriptions: Zyprexa, Klonopin, Wellbutrin Over the Counter: Pt denies History of alcohol / drug use?: No history of alcohol / drug abuse Longest period of sobriety (when/how long): NA  Sleep: Fair  Appetite:  Fair  Current Medications: Current Facility-Administered Medications  Medication Dose Route Frequency Provider Last Rate Last Dose  . acetaminophen (TYLENOL) tablet 650 mg  650 mg Oral Q6H PRN Benjamine Mola, FNP      . alum & mag hydroxide-simeth (MAALOX/MYLANTA) 200-200-20 MG/5ML suspension 30 mL  30 mL Oral Q4H PRN Benjamine Mola,  FNP      . buPROPion (WELLBUTRIN XL) 24 hr tablet 150 mg  150 mg Oral Daily Benjamine Mola, FNP   150 mg at 09/12/15 1016  . clonazePAM (KLONOPIN) tablet 2 mg  2 mg Oral QHS Benjamine Mola, FNP   2 mg at 09/11/15 2259  . hydrOXYzine (ATARAX/VISTARIL) tablet 50 mg  50 mg Oral TID PRN Benjamine Mola, FNP   50 mg at 09/12/15 1152  . lithium carbonate capsule 150 mg  150 mg Oral BID WC Benjamine Mola, FNP   150 mg at 09/12/15 1014  . magnesium hydroxide (MILK OF MAGNESIA) suspension 30 mL  30 mL Oral Daily PRN Benjamine Mola, FNP       . methocarbamol (ROBAXIN) tablet 750 mg  750 mg Oral Q6H PRN Benjamine Mola, FNP        Lab Results: No results found for this or any previous visit (from the past 48 hour(s)).  Physical Findings: AIMS: Facial and Oral Movements Muscles of Facial Expression: None, normal Lips and Perioral Area: None, normal Jaw: None, normal Tongue: None, normal,Extremity Movements Upper (arms, wrists, hands, fingers): None, normal Lower (legs, knees, ankles, toes): None, normal, Trunk Movements Neck, shoulders, hips: None, normal, Overall Severity Severity of abnormal movements (highest score from questions above): None, normal Incapacitation due to abnormal movements: None, normal Patient's awareness of abnormal movements (rate only patient's report): No Awareness, Dental Status Current problems with teeth and/or dentures?: No Does patient usually wear dentures?: No  CIWA:    COWS:     Musculoskeletal: Strength & Muscle Tone: decreased Gait & Station: walks with cane, or mobilizes in wheelchair  Patient leans: N/A  Psychiatric Specialty Exam: ROS denies shortness of breath or chest pain  Blood pressure 116/80, pulse 109, temperature 98.2 F (36.8 C), temperature source Oral, resp. rate 16, height 5' 1.5" (1.562 m), weight 140 lb (63.504 kg), last menstrual period 08/07/2015, SpO2 100 %.Body mass index is 26.03 kg/(m^2).  General Appearance: improved grooming   Eye Contact::  Good  Speech:  Normal Rate  Volume:  Decreased  Mood:  Depressed  Affect:  cosntricted but briefly reactive   Thought Process:  Linear  Orientation:  Full (Time, Place, and Person)  Thought Content:  no psychotic symptoms, ruminative about stressors   Suicidal Thoughts:  Yes.  without intent/plan- chronic suicidal thoughts- at this time denies any plan , intention of hurting self, identifies mother as protective factor against  hurting self,  Contracts for safety on the unit   Homicidal Thoughts:  No  Memory:   recent and remote grossly intact   Judgement:  Fair  Insight:  Present  Psychomotor Activity:  Decreased  Concentration:  Good  Recall:  Good  Fund of Knowledge:Good  Language: Good  Akathisia:  Negative  Handed:  Right  AIMS (if indicated):     Assets:  Communication Skills Resilience  ADL's:   Improved   Cognition: WNL  Sleep:  Number of Hours: 4.5  Assessment - patient has chronic , severe depression, and chronic suicidal ruminations. At this time presents partially improved , with a somewhat improved range of affect. Able to contract for safety on the unit at this time. Denies medication side effects at this time and is agreeing to titrate Wellbutrin XL . We discussed restarting Zyprexa, which she had been taking in the past for insomnia /antidepressant augmentation ( patient is not psychotic), but she prefers not to restart This medication at  this time. Treatment Plan Summary: Daily contact with patient to assess and evaluate symptoms and progress in treatment, Medication management, Plan inpatient treatment and medications as below  Increase Wellbutrin XL to 300 mgrs QDAY to address depression Continue Klonopin 2 mgrs QHS to address insomnia, anxiety Continue Lithium 150 mgrs BID as antidepressant augmentation and to help decrease long term suicidal risk. Continue Vistaril PRNs for severe anxiety if needed  Encourage increased group participation to work on coping skills and help with symptom reduction  COBOS, Edgar 09/12/2015, 5:00 PM

## 2015-09-12 NOTE — [facility] Group Notes (Signed)
[facility] LCSW Aftercare Discharge Planning Group Note  09/12/2015 8:45 AM  Participation Quality: Alert, Appropriate and Oriented  Mood/Affect: Flat  Depression Rating: 10  Anxiety Rating: 10  Thoughts of Suicide: Pt endorses SI  Will you contract for safety? Yes  Current AVH: Pt denies  Plan for Discharge/Comments: Pt attended discharge planning group and actively participated in group. CSW discussed suicide prevention education with the group and encouraged them to discuss discharge planning and any relevant barriers. Pt reports that she is very depressed and that her aftercare plan "fell apart" last admission after she "relapsed severely" during IOP. She reports that her mother's health is declining significantly which is a major stressor.  Transportation Means: Pt reports access to transportation  Supports: No supports mentioned at this time  Chad Cordial, LCSWA 09/12/2015 9:20 AM
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
## 2015-09-12 NOTE — Progress Notes (Signed)
D: Natasha [name] states today was not the worst nor the best for her. Her mother came to visit today and Natasha [name] states she was not in the best mood with her mother and she feels guilty about this. She rates her depression tonight 8/10. She still endorses having little interest in doing things here and feeling hopeless but not as much as when she initially was admitted. She has been attending groups.  A: Encouraged Natasha [name] to call her mother back later before she went to bed if she thought that would help with her guilt. She did call her mother tonight and stated she felt much better after speaking with her.  R: Continue to monitor patient for safety and medication effectiveness.

## 2015-09-13 NOTE — Progress Notes (Signed)
D: Pt has depressed affect and mood.  Pt reports "I said some stuff I needed to say earlier" when asked what her goal was today.  Pt reports thoughts of self-harm and verbally contracts for safety.  Pt denies HI, denies hallucinations, denies pain.  Pt has been visible in milieu interacting with peers and staff appropriately.  Pt attended and participated in evening group.   A: Introduced self to pt.  Met with pt and provided support and encouragement.  Actively listened to pt.  Medications administered per order.   R: Pt is compliant with medications.  Pt verbally contracts for safety.  Will continue to monitor and assess.

## 2015-09-13 NOTE — [facility] Group Notes (Signed)
[facility] Group Notes:  (Clinical Social Work)  09/13/2015     1:15-2:15PM  Summary of Progress/Problems:   The main focus of today's process group was to learn how to use a decisional balance exercise to move forward in the Stages of Change, which were described and discussed.  Patients listed needs on the whiteboard and unhealthy coping techniques often used to fill needs.  Motivational Interviewing and the whiteboard were utilized to help patients explore in depth the perceived benefits and costs of unhealthy coping techniques, as well as the  benefits and costs of replacing that with a healthy coping skills.     The patient expressed that her own unhealthy coping involves isolation.  She was very involved in the discussion and thoughtful about her comments.  She talked at length about how deceptive the trap of isolation is, making her feel that it is the best thing to do, and eventually leading her to believe suicide is really the most reasonable thing for her to do.  She asked clinician and group numerous times how to develop more of a support system to try to avoid this.  Type of Therapy:  Group Therapy - Process   Participation Level:  Active  Participation Quality:  Appropriate, Attentive and Sharing  Affect:  Depressed and Flat  Cognitive:  Appropriate and Oriented  Insight:  Engaged  Engagement in Therapy:  Engaged  Modes of Intervention:  Education, Motivational Interviewing  Ambrose Mantle, LCSW 09/13/2015, 2:52 PM

## 2015-09-13 NOTE — Progress Notes (Signed)
[name] cont to struggle with her intense emotions.She is Anxious  about being honest with her mother ( whom she lives with ) and she struggles with the  guilt she feels when she entertains ideas of NOT living with her mother ( who suffers from Parkinsons). She requested this nurse sit and speak with her today to process  these internal conflicts and stated she appreciated   The insight and the ability to practice new coping skills.   A She requested and was given  Prn vistaril today ( for c/o anxiety) and stated relief afterwards.    R Safety in place.

## 2015-09-13 NOTE — Tx Team (Signed)
Initial Interdisciplinary Treatment Plan   PATIENT STRESSORS: Financial difficulties Health problems Legal issue Marital or family conflict Medication change or noncompliance Substance abuse   PATIENT STRENGTHS: Ability for insight Active sense of humor Average or above average intelligence Capable of independent living Communication skills   PROBLEM LIST: Problem List/Patient Goals Date to be addressed Date deferred Reason deferred Estimated date of resolution  Depression with HI  [id number]     Suicidal Attempt with SI                                                 DISCHARGE CRITERIA:  Ability to meet basic life and health needs Adequate post-discharge living arrangements Improved stabilization in mood, thinking, and/or behavior  PRELIMINARY DISCHARGE PLAN: Attend aftercare/continuing care group Attend PHP/IOP Attend 12-step recovery group  PATIENT/FAMIILY INVOLVEMENT: This treatment plan has been presented to and reviewed with the patient, [name], and/or family member, .  The patient and family have been given the opportunity to ask questions and make suggestions.  Rich Brave 09/13/2015, 1:50 PM

## 2015-09-13 NOTE — [facility] Group Notes (Signed)
[facility] Group Notes:  (Nursing/MHT/Case Management/Adjunct)  Date:  09/13/2015  Time:  9:27 AM  Type of Therapy:  Psychoeducational Skills  Participation Level:  Active  Participation Quality:  Appropriate  Affect:  Appropriate  Cognitive:  Appropriate  Insight:  Appropriate  Engagement in Group:  Engaged  Modes of Intervention:  Discussion  Summary of Progress/Problems: Pt did attend self inventory group.  Jacquelyne Balint Shanta 09/13/2015, 9:27 AM

## 2015-09-13 NOTE — Progress Notes (Signed)
The focus of this group is to help patients review their daily goal of treatment and discuss progress on daily workbooks. Pt attended the evening group but responded minimally to discussion prompts from the Writer. Pt was initially hesitant to participate in group, saying "I don't want to be a buzzkill." Both the Writer and many of the Pt's peers assured her that she was not a buzzkill. She subsequently appeared receptive to this encouragement. Pt commented the she found the Nursing Staff very helpful today. Isra reported having no additional needs this evening and refused her snack after group. Pt appeared depressed in group.

## 2015-09-13 NOTE — Plan of Care (Signed)
Problem: Diagnosis: Increased Risk For Suicide Attempt Goal: STG-Patient Will Attend All Groups On The Unit Outcome: Progressing Pt attended evening group on 09/13/15.     

## 2015-09-13 NOTE — [facility] Group Notes (Signed)
[facility] Group Notes:  (Nursing/MHT/Case Management/Adjunct)  Date:  09/13/2015  Time:  11:26 AM  Type of Therapy:  Psychoeducational Skills  Participation Level:  Active  Participation Quality:  Appropriate  Affect:  Appropriate  Cognitive:  Appropriate  Insight:  Appropriate  Engagement in Group:  Engaged  Modes of Intervention:  Discussion  Summary of Progress/Problems: Pt did attend healthy coping skills group.    Jacquelyne Balint Shanta 09/13/2015, 11:26 AM

## 2015-09-13 NOTE — Progress Notes (Signed)
Patient ID: Natasha [name], female   DOB: 30-Oct-1971, 44 y.o.   MRN: [id number] [facility] MD Progress Note  09/13/2015 2:43 PM Natasha [name]  MRN:  [id number] Subjective:    She reports feeling " the same", with chronic depression, and chronic suicidal thoughts . She does acknowledge she has been able to enjoy herself a little more, and has been going to groups, interacting more with peers, and reading , which is a favorite pastime for her . At present denies medication side effects, but states she feels current doses " not really working a whole lot yet ".  Objective : I have discussed case with treatment team and have met with patient. She reports ongoing depression, sadness. She states she felt guilty yesterday evening when her mother came to visit because she feels she was somewhat abrupt with mother and asked her to leave before visiting hours had ended. Tends to ruminate, but states she has been able to distract self more by going to groups or reading. She stated " the book I am reading right now is really good, I love that author". As per above, seems less anhedonic, less intensely ruminative about suicide , as she had been prior to admission. She contracts for safety on the unit, denies any plan or intention of hurting self on unit at this time. Wants to proceed with current medication regimen, titrate Wellbutrin further.  She is visible in day room and going to some groups .  Principal Problem: MDD (major depressive disorder), recurrent severe, without psychosis ([place]) Diagnosis:   Patient Active Problem List   Diagnosis Date Noted  . MDD (major depressive disorder), recurrent severe, without psychosis [F33.2] 09/11/2015  . Major depressive disorder, recurrent, severe without psychotic features [F33.2]   . Severe recurrent major depression without psychotic features [F33.2] 06/19/2015   Total Time spent with patient: 20 minutes    Past Medical History:  Past Medical History  Diagnosis  Date  . C6 spinal cord injury   . Depression     Past Surgical History  Procedure Laterality Date  . Back surgery     Family History:  Family History  Problem Relation Age of Onset  . Alcohol abuse Father   . Alcohol abuse Brother     Social History:  History  Alcohol Use  . Yes    Comment: One drink a week at most; rarely drinks     History  Drug Use No    Social History   Social History  . Marital Status: Single    Spouse Name: N/A  . Number of Children: N/A  . Years of Education: N/A   Social History Main Topics  . Smoking status: Never Smoker   . Smokeless tobacco: Never Used     Comment: No smoking hx; no need for cessation materials  . Alcohol Use: Yes     Comment: One drink a week at most; rarely drinks  . Drug Use: No  . Sexual Activity: No   Other Topics Concern  . None   Social History Narrative   Additional Social History:    Pain Medications: Pt denies  Prescriptions: Zyprexa, Klonopin, Wellbutrin Over the Counter: Pt denies History of alcohol / drug use?: No history of alcohol / drug abuse Longest period of sobriety (when/how long): NA  Sleep:  improving  Appetite:  Fair  Current Medications: Current Facility-Administered Medications  Medication Dose Route Frequency Provider Last Rate Last Dose  . acetaminophen (TYLENOL) tablet 650 mg  650 mg Oral Q6H PRN Benjamine Mola, FNP   650 mg at 09/13/15 1104  . alum & mag hydroxide-simeth (MAALOX/MYLANTA) 200-200-20 MG/5ML suspension 30 mL  30 mL Oral Q4H PRN Benjamine Mola, FNP      . buPROPion (WELLBUTRIN XL) 24 hr tablet 300 mg  300 mg Oral Daily [name], MD   300 mg at 09/13/15 0819  . clonazePAM (KLONOPIN) tablet 2 mg  2 mg Oral QHS Benjamine Mola, FNP   2 mg at 09/12/15 2242  . hydrOXYzine (ATARAX/VISTARIL) tablet 50 mg  50 mg Oral TID PRN Benjamine Mola, FNP   50 mg at 09/13/15 1104  . lithium carbonate capsule 150 mg  150 mg Oral BID WC Benjamine Mola, FNP   150 mg at 09/13/15  0819  . magnesium hydroxide (MILK OF MAGNESIA) suspension 30 mL  30 mL Oral Daily PRN Benjamine Mola, FNP      . methocarbamol (ROBAXIN) tablet 750 mg  750 mg Oral Q6H PRN Benjamine Mola, FNP        Lab Results: No results found for this or any previous visit (from the past 48 hour(s)).  Physical Findings: AIMS: Facial and Oral Movements Muscles of Facial Expression: None, normal Lips and Perioral Area: None, normal Jaw: None, normal Tongue: None, normal,Extremity Movements Upper (arms, wrists, hands, fingers): None, normal Lower (legs, knees, ankles, toes): None, normal, Trunk Movements Neck, shoulders, hips: None, normal, Overall Severity Severity of abnormal movements (highest score from questions above): None, normal Incapacitation due to abnormal movements: None, normal Patient's awareness of abnormal movements (rate only patient's report): No Awareness, Dental Status Current problems with teeth and/or dentures?: No Does patient usually wear dentures?: No  CIWA:  CIWA-Ar Total: 0 COWS:  COWS Total Score: 2  Musculoskeletal: Strength & Muscle Tone: decreased Gait & Station: walks with cane, or mobilizes in wheelchair  Patient leans: N/A  Psychiatric Specialty Exam: ROS denies shortness of breath or chest pain  Blood pressure 116/64, pulse 115, temperature 98.1 F (36.7 C), temperature source Oral, resp. rate 16, height 5' 1.5" (1.562 m), weight 140 lb (63.504 kg), last menstrual period 08/07/2015, SpO2 100 %.Body mass index is 26.03 kg/(m^2).  General Appearance: improved grooming   Eye Contact::  Good  Speech:  Normal Rate  Volume:  Normal  Mood:  Depressed  Affect:   Less constricted, smiles at times appropriately  Thought Process:  Linear  Orientation:  Full (Time, Place, and Person)  Thought Content:   No hallucinations, no delusions  Suicidal Thoughts:  Yes.  without intent/plan- chronic suicidal thoughts- at this time denies any plan , intention of hurting self,  identifies mother as protective factor against  hurting self,  Contracts for safety on the unit   Homicidal Thoughts:  No  Memory:  recent and remote grossly intact   Judgement:  Fair  Insight:  Present  Psychomotor Activity:   Improving- has been more visible in day room, less isolated   Concentration:  Good  Recall:  Good  Fund of Knowledge:Good  Language: Good  Akathisia:  Negative  Handed:  Right  AIMS (if indicated):     Assets:  Communication Skills Resilience  ADL's:   Improved   Cognition: WNL  Sleep:  Number of Hours: 4.25  Assessment - patient is improving gradually compared to admission- although reports ongoing depression, sadness, ruminations about mother's medical illness, and chronic suicidal ideations, presents with improving range of affect, less anhedonic,  more interactive and reading as a favorite pastime. Thus far she is tolerating medications well ( Wellbutrin XL and Lithium ) .  At this time denies any current plan or intention of hurting self .  Treatment Plan Summary: Daily contact with patient to assess and evaluate symptoms and progress in treatment, Medication management, Plan inpatient treatment and medications as below  Continue  Wellbutrin XL  300 mgrs QDAY to address depression Continue Klonopin 2 mgrs QHS to address insomnia, anxiety Continue Lithium 150 mgrs BID as antidepressant augmentation and to help decrease long term suicidal risk. Continue Vistaril PRNs for severe anxiety if needed  Encourage increased group participation to work on coping skills and help with symptom reduction  COBOS, [place] 09/13/2015, 2:43 PM

## 2015-09-14 MED ORDER — CLONAZEPAM 0.5 MG PO TABS
ORAL_TABLET | ORAL | Status: AC
Start: 2015-09-14 — End: 2015-09-15
  Filled 2015-09-14: qty 1

## 2015-09-14 MED ORDER — MEGESTROL ACETATE 40 MG/ML PO SUSP
400.0000 mg | Freq: Every day | ORAL | Status: DC
  Administered 2015-09-14 – 2015-09-18 (×5): 400 mg via ORAL
  Filled 2015-09-14 (×6): qty 10

## 2015-09-14 MED ORDER — OLANZAPINE 2.5 MG PO TABS
2.5000 mg | ORAL_TABLET | Freq: Every day | ORAL | Status: DC
  Administered 2015-09-14 – 2015-09-15 (×2): 2.5 mg via ORAL
  Filled 2015-09-14 (×5): qty 1

## 2015-09-14 MED ORDER — CLONAZEPAM 0.5 MG PO TABS
0.5000 mg | ORAL_TABLET | Freq: Once | ORAL | Status: AC
  Administered 2015-09-14: 0.5 mg via ORAL

## 2015-09-14 MED ORDER — ENSURE ENLIVE PO LIQD
237.0000 mL | Freq: Two times a day (BID) | ORAL | Status: DC
  Administered 2015-09-14 – 2015-09-17 (×5): 237 mL via ORAL

## 2015-09-14 NOTE — [facility] Group Notes (Signed)
[facility] Group Notes: (Clinical Social Work)   09/14/2015      Type of Therapy:  Group Therapy   Participation Level:  Did Not Attend despite MHT prompting   Hermione Havlicek Grossman-Orr, LCSW 09/14/2015, 3:08 PM     

## 2015-09-14 NOTE — Plan of Care (Signed)
Problem: Diagnosis: Increased Risk For Suicide Attempt Goal: STG-Patient Will Comply With Medication Regime Outcome: Progressing Patience is compliant with medications at this time and is utilizing PRN medications.

## 2015-09-14 NOTE — Progress Notes (Signed)
Patient ID: Natasha [name], female   DOB: 12/30/1970, 44 y.o.   MRN: [id number] [facility] MD Progress Note  09/14/2015 5:22 PM Natasha [name]  MRN:  [id number] Subjective:    She reports feeling  Depressed and anxious . States she is making efforts to address these feelings by reading, going to groups, talking to people.  At this time denies medication side effects. Remains ruminative about her mother's declining health. . Reports increased anxiety  And anorexia today, which may be related to Wellbutrin  Trial.  Objective : I have discussed case with treatment team and have met with patient. As noted, patient reports ongoing depression, sadness, low energy level,some anhedonia, although she states she has enjoyed reading and speaking with people on the unit. As noted, describes some increased anxiety today, and is unsure why- she does have significant stress and worry revolving around her mother's medical illness ( Parkinson's) and her gradual decline in health. We discussed medication issues- she states Wellbutrin has helped better than others so at this time we decided to continue Wellbutrin. She did well with Zyprexa in the past, which was well tolerated and appeared to improve anorexia, improve sleep , and decrease night time anxious ruminations in the past .  She is visible on unit,  day room . Mobilizing in wheel chair .   Principal Problem: MDD (major depressive disorder), recurrent severe, without psychosis ([place]) Diagnosis:   Patient Active Problem List   Diagnosis Date Noted  . MDD (major depressive disorder), recurrent severe, without psychosis ([place]) [F33.2] 09/11/2015  . Major depressive disorder, recurrent, severe without psychotic features ([place]) [F33.2]   . Severe recurrent major depression without psychotic features ([place]) [F33.2] 06/19/2015   Total Time spent with patient: 20 minutes    Past Medical History:  Past Medical History  Diagnosis Date  . C6 spinal cord injury   .  Depression     Past Surgical History  Procedure Laterality Date  . Back surgery     Family History:  Family History  Problem Relation Age of Onset  . Alcohol abuse Father   . Alcohol abuse Brother     Social History:  History  Alcohol Use  . Yes    Comment: One drink a week at most; rarely drinks     History  Drug Use No    Social History   Social History  . Marital Status: Single    Spouse Name: N/A  . Number of Children: N/A  . Years of Education: N/A   Social History Main Topics  . Smoking status: Never Smoker   . Smokeless tobacco: Never Used     Comment: No smoking hx; no need for cessation materials  . Alcohol Use: Yes     Comment: One drink a week at most; rarely drinks  . Drug Use: No  . Sexual Activity: No   Other Topics Concern  . None   Social History Narrative   Additional Social History:    Pain Medications: Pt denies  Prescriptions: Zyprexa, Klonopin, Wellbutrin Over the Counter: Pt denies History of alcohol / drug use?: No history of alcohol / drug abuse Longest period of sobriety (when/how long): NA  Sleep:  Fair   Appetite:   Poor today  Current Medications: Current Facility-Administered Medications  Medication Dose Route Frequency Provider Last Rate Last Dose  . acetaminophen (TYLENOL) tablet 650 mg  650 mg Oral Q6H PRN Benjamine Mola, FNP   650 mg at 09/14/15 0753  .  alum & mag hydroxide-simeth (MAALOX/MYLANTA) 200-200-20 MG/5ML suspension 30 mL  30 mL Oral Q4H PRN Benjamine Mola, FNP      . buPROPion (WELLBUTRIN XL) 24 hr tablet 300 mg  300 mg Oral Daily [name], MD   300 mg at 09/14/15 0752  . clonazePAM (KLONOPIN) tablet 2 mg  2 mg Oral QHS Benjamine Mola, FNP   2 mg at 09/13/15 2155  . feeding supplement (ENSURE ENLIVE) (ENSURE ENLIVE) liquid 237 mL  237 mL Oral BID BM [name] [name], MD   237 mL at 09/14/15 1427  . hydrOXYzine (ATARAX/VISTARIL) tablet 50 mg  50 mg Oral TID PRN Benjamine Mola, FNP   50 mg at 09/14/15  1204  . lithium carbonate capsule 150 mg  150 mg Oral BID WC Benjamine Mola, FNP   150 mg at 09/14/15 1704  . magnesium hydroxide (MILK OF MAGNESIA) suspension 30 mL  30 mL Oral Daily PRN Benjamine Mola, FNP      . megestrol (MEGACE) 40 MG/ML suspension 400 mg  400 mg Oral Daily [name] [name], MD   400 mg at 09/14/15 1320  . methocarbamol (ROBAXIN) tablet 750 mg  750 mg Oral Q6H PRN Benjamine Mola, FNP        Lab Results: No results found for this or any previous visit (from the past 48 hour(s)).  Physical Findings: AIMS: Facial and Oral Movements Muscles of Facial Expression: None, normal Lips and Perioral Area: None, normal Jaw: None, normal Tongue: None, normal,Extremity Movements Upper (arms, wrists, hands, fingers): None, normal Lower (legs, knees, ankles, toes): None, normal, Trunk Movements Neck, shoulders, hips: None, normal, Overall Severity Severity of abnormal movements (highest score from questions above): None, normal Incapacitation due to abnormal movements: None, normal Patient's awareness of abnormal movements (rate only patient's report): No Awareness, Dental Status Current problems with teeth and/or dentures?: No Does patient usually wear dentures?: No  CIWA:  CIWA-Ar Total: 0 COWS:  COWS Total Score: 0  Musculoskeletal: Strength & Muscle Tone: decreased Gait & Station: walks with cane, or mobilizes in wheelchair  Patient leans: N/A  Psychiatric Specialty Exam: ROS denies shortness of breath or chest pain, reports some anorexia today  Blood pressure 106/85, pulse 89, temperature 98.1 F (36.7 C), temperature source Oral, resp. rate 16, height 5' 1.5" (1.562 m), weight 140 lb (63.504 kg), last menstrual period 08/07/2015, SpO2 100 %.Body mass index is 26.03 kg/(m^2).  General Appearance: improved grooming   Eye Contact::  Good  Speech:  Normal Rate  Volume:  Normal  Mood:  Depressed  Affect:    Constricted, but improved compared to admission , anxious    Thought Process:  Linear  Orientation:  Full (Time, Place, and Person)  Thought Content:   No hallucinations, no delusions  Suicidal Thoughts:  Yes.  without intent/plan- chronic suicidal thoughts- at this time  Continues to deny any  Current plan , intention of hurting self,  And has identified her sense of duty to her  mother as protective factor against  hurting self,  Contracts for safety on the unit   Homicidal Thoughts:  No  Memory:  recent and remote grossly intact   Judgement:  Fair  Insight:  Present  Psychomotor Activity:   Improving- has been more visible in day room, less isolated   Concentration:  Good  Recall:  Good  Fund of Knowledge:Good  Language: Good  Akathisia:  Negative  Handed:  Right  AIMS (if  indicated):     Assets:  Communication Skills Resilience  ADL's:   Improved   Cognition: WNL  Sleep:  Number of Hours: 3.25  Assessment -  Patient has chronic depression, chronic suicidal ruminations. She is partially improved compared to admission presentation but reports ongoing depression, and feels more anxious today, with ongoing worries and ruminations about her mother's health. She is on Wellbutrin XL and recent increase to 300 mgrs may be contributing to anorexia and some anxiety, but has history of responding better to this medication than to others and at this time is agreeing to continue this trial. Has history of good response to Zyprexa as augmentation and on last admission improved on Wellbutrin/Zyprexa combination. Agrees to Zyprexa trial.  Treatment Plan Summary: Daily contact with patient to assess and evaluate symptoms and progress in treatment, Medication management, Plan inpatient treatment and medications as below  Continue  Wellbutrin XL  300 mgrs QDAY to address depression- monitor for further side effects. Start Zyprexa 2.5 mgrs QHS initially, to address depression, anxiety, insomnia, mood disorder - side effects reviewed  Start Megace to assist with  anorexia., as discussed with Pharmacist .  Continue Klonopin 2 mgrs QHS to address insomnia, anxiety Continue Lithium 150 mgrs BID as antidepressant augmentation and to help decrease long term suicidal risk. Continue Vistaril PRNs for severe anxiety if needed  Encourage increased group participation to work on coping skills and help with symptom reduction Of note, patient states she did well on ECT course, felt better on it, and had no side effects, but quickly lapsed back into depression after stopping ECT - we have discussed referring for maintenance ECT after discharge as a treatment option.   Genesi Stefanko, [place] 09/14/2015, 5:22 PM

## 2015-09-14 NOTE — [facility] Group Notes (Signed)
[facility] Group Notes:  (Nursing/MHT/Case Management/Adjunct)  Date:  09/14/2015  Time:  10:51 AM  Type of Therapy:  Nurse Education  Participation Level:  Active  Participation Quality:  Drowsy  Affect:  Anxious and Depressed  Cognitive:  Appropriate  Insight:  Limited  Engagement in Group:  Engaged  Modes of Intervention:  Discussion and Education  Summary of Progress/Problems: Patient attended group however reports she does not want her mother to visit because seeing her mom makes her feel more depressed. Patient was encouraged to focus on a goal for the day and review booklet of the day which is healthy support systems.   Marzetta Board E 09/14/2015, 10:51 AM

## 2015-09-14 NOTE — Progress Notes (Signed)
Patient ID: Natasha [name], female   DOB: 04/07/1971, 44 y.o.   MRN: [id number]  DAR: Pt. Denies HI and A/V Hallucinations. She reports her sleep was good last night, appetite is poor, energy level is low, and concentration level is good. She has not eaten anything solid today. Writer encouraged patient to eat snacks and meals however patient refused. Patient was given 2 Ensures throughout the day and was able to drink some. However, appetite remains very poor. She rates her depression is 8/10, hopelessness is 10/10, and 10/10 for anxiety. She continues to report SI which patient says is chronic and continuous for her. She is able to contract for safety. Patient did report headache and she received PRN Tylenol which provided some relief. Patient reported high anxiety throughout the day. She received PRN Vistaril however reports that was not helpful. Writer administered a one time dose of 0.5 mg of Klonopin and she reports it was effective. Patient came to writer a little while after and reported high anxiety again. Writer spoke to patient 1:1 and offered support and listening. Patient revealed that the loud sounds on the hall were a stressor. After the football game was over the hall quieted down and Clinical research associate assessed that patient's anxiety was decreasing. Support and encouragement provided to the patient. Scheduled medications administered to patient per physician's orders. Patient is receptive and cooperative. She continues to use her wheelchair as needed. Q15 minute checks are maintained for safety.

## 2015-09-14 NOTE — Progress Notes (Signed)
Adult Psychoeducational Group Note  Date:  09/14/2015 Time:  8:55 PM  Group Topic/Focus:  Wrap-Up Group:   The focus of this group is to help patients review their daily goal of treatment and discuss progress on daily workbooks.  Participation Level:  Active  Participation Quality:  Appropriate and Attentive  Affect:  Appropriate  Cognitive:  Appropriate  Insight: Appropriate and Good  Engagement in Group:  Engaged  Modes of Intervention:  Education  Additional Comments:  Pt's was asked to provide whom they consider a health support system based on their group topic this morning. Pt mentioned she need to build a support system because as of right now she do not have a support system.   Merlinda Frederick 09/14/2015, 8:55 PM

## 2015-09-14 NOTE — Progress Notes (Signed)
D: Pt presents flat in affect and depressed in mood. Anxiety inferred and verbalized by patient. Pt reports that she continues to have SI but no direct plans to use at [facility]. Pt reports that she came to [facility] to be safe. Pt verbally contracts for safety. Pt actively participates within the milieu.  A: Writer administered scheduled medications to pt, per MD orders. Continued support and availability as needed was extended to this pt. Staff continue to monitor pt with q63min checks.  R: No adverse drug reactions noted. Pt receptive to treatment. Pt remains safe at this time.

## 2015-09-14 NOTE — Progress Notes (Signed)
Per report, pt has not been eating for the past few days.  She was given an ensure last night and ensure was ordered BID between meals.  She did not go to breakfast this morning.

## 2015-09-15 MED ORDER — SUMATRIPTAN SUCCINATE 25 MG PO TABS
25.0000 mg | ORAL_TABLET | ORAL | Status: DC | PRN
  Administered 2015-09-15 – 2015-09-25 (×3): 25 mg via ORAL
  Filled 2015-09-15 (×3): qty 1

## 2015-09-15 NOTE — [facility] Group Notes (Signed)
[facility] LCSW Group Therapy  09/15/2015 1:15pm  Type of Therapy:  Group Therapy vercoming Obstacles  Participation Level:  Withdrawn but Participates  Participation Quality:  Appropriate   Affect:  Flat  Cognitive:  Appropriate and Oriented  Insight:  Developing/Improving and Improving  Engagement in Therapy:  Improving  Modes of Intervention:  Discussion, Exploration, Problem-solving and Support  Description of Group:   In this group patients will be encouraged to explore what they see as obstacles to their own wellness and recovery. They will be guided to discuss their thoughts, feelings, and behaviors related to these obstacles. The group will process together ways to cope with barriers, with attention given to specific choices patients can make. Each patient will be challenged to identify changes they are motivated to make in order to overcome their obstacles. This group will be process-oriented, with patients participating in exploration of their own experiences as well as giving and receiving support and challenge from other group members.  Summary of Patient Progress: Pt participated voluntarily, however when participated was observed to be withdrawn with low volume of speech. Pt identified fear as an obstacle in her life currently as it causes her to not address other obstacles in her life. She expressed that she has a goal of becoming more stable and independent by overcoming the obstacle of depression.    Therapeutic Modalities:   Cognitive Behavioral Therapy Solution Focused Therapy Motivational Interviewing Relapse Prevention Therapy   Chad Cordial, LCSWA 09/15/2015 2:57 PM

## 2015-09-15 NOTE — Progress Notes (Signed)
Adult Psychoeducational Group Note  Date:  09/15/2015 Time:  8:45 PM  Group Topic/Focus:  Wrap-Up Group:   The focus of this group is to help patients review their daily goal of treatment and discuss progress on daily workbooks.  Participation Level:  Minimal  Participation Quality:  Attentive  Affect:  Depressed  Cognitive:  Appropriate  Insight: Appropriate  Engagement in Group:  Limited  Modes of Intervention:  Discussion  Additional Comments:  Pt seemed depressed during wrap-up group and spoke very softly. Pt reported that her day started off "horrible" but progressed throughout the day. Pt noted that a positive note of interacting with her mother today. Pt reported that her goal for the day was "to get through it", which she feels that she achieved.   Natasha [name] 09/15/2015, 9:20 PM

## 2015-09-15 NOTE — Progress Notes (Signed)
Patient ID: Natasha [name], female   DOB: 1971/09/14, 44 y.o.   MRN: [id number] PER STATE REGULATIONS 482.30  THIS CHART WAS REVIEWED FOR MEDICAL NECESSITY WITH RESPECT TO THE PATIENT'S ADMISSION/ DURATION OF STAY.  NEXT REVIEW DATE: 09/19/2015  Willa Rough, RN, BSN CASE MANAGER

## 2015-09-15 NOTE — Progress Notes (Signed)
Patient ID: MONALISA BAYLESS, female   DOB: [birth year], 44 y.o.   MRN: [id number]  Pt currently presents with a flat affect and depressed, needy behavior. Per self inventory, pt rates depression at a 8-10, hopelessness 10-11 and anxiety 10-11. Pt's daily goal is to "distract myself" and they intend to do so by "go to groups." Pt reports good sleep, a poor appetite, low energy and poor concentration. Pt reports to Clinical research associate, "I feel a little better today, I don't know what happened yesterday I felt like I was having chest pains."  Pt provided with medications per providers orders. Pt's labs and vitals were monitored throughout the day. Pt supported emotionally and encouraged to express concerns and questions. Pt encouraged to use coping skills like deep breathing and talking with staff during periods of increased anxiety. Pt educated on medications. Pt's safety ensured with 15 minute and environmental checks. Pt endorses SI, no plan. States "I wouldn't do anything while I'm here." Pt currently denies HI and A/V hallucinations. Pt verbally agrees to seek staff if HI or A/VH occurs, if SI worsens and to consult with staff before acting on these thoughts. Will continue POC.

## 2015-09-15 NOTE — [facility] Group Notes (Signed)
[facility] LCSW Aftercare Discharge Planning Group Note  09/15/2015 8:45 AM  Participation Quality: Alert, Appropriate and Oriented  Mood/Affect: Flat and Withdrawn  Depression Rating: "11"  Anxiety Rating: "11"  Thoughts of Suicide: Pt endorses SI  Will you contract for safety? Yes  Current AVH: Pt denies  Plan for Discharge/Comments: Pt attended discharge planning group and actively participated in group. CSW discussed suicide prevention education with the group and encouraged them to discuss discharge planning and any relevant barriers. Pt remains flat and depressed and expresses that she is feeling no better since Friday.  Transportation Means: Pt reports access to transportation  Supports: No supports mentioned at this time  Chad Cordial, LCSWA 09/15/2015 9:41 AM

## 2015-09-15 NOTE — Plan of Care (Signed)
Problem: Alteration in mood Goal: LTG-Patient reports reduction in suicidal thoughts (Patient reports reduction in suicidal thoughts and is able to verbalize a safety plan for whenever patient is feeling suicidal)  Outcome: Not Progressing D: Pt continues to present with constant SI. Pt reports that [facility]. Pt verbally contracts for safety.

## 2015-09-15 NOTE — Progress Notes (Signed)
Patient ID: ANESIA BLACKWELL, female   DOB: 09/07/1971, 44 y.o.   MRN: [id number] Patient ID: ALEXISMARIE FLAIM, female   DOB: Mar 03, 1971, 44 y.o.   MRN: [id number] [facility] MD Progress Note  09/15/2015 1:59 PM LOGYN DEDOMINICIS  MRN:  [id number]  Subjective: Natasha [name] is seen, chart reviewed. She remains very depressed, tearful & negative. She presents with a lot of regrets & self blame for attempting suicide in 2002 that rendered her almost paralyzed. She says she suffere from chronic suicidal ideations. She complains of being & feeling frustrated because of familial stressors. Natasha [name] says she lives with her mother who is battling parkinson's disease. She says she yells & screams at her mother due to her own anxiety & worsening depression. She blames that she has failed in life with nothing to for her life. She is tolerating her treatment regimen without any adverse effects reported.  Objective: I have discussed case with treatment team and have met with patient. As noted, Natasha [name] reports ongoing depression, sadness, low energy level,some anhedonia, although she states she has enjoyed reading and speaking with people on the unit. As noted, describes some increased anxiety, and is unsure why- she does have significant stress and worry revolving around her mother's medical illness ( Parkinson's) and her gradual decline in health. We discussed medication issues- she states Wellbutrin has helped better than others so at this time we decided to continue Wellbutrin. She did well with Zyprexa in the past, which was well tolerated and appeared to improve anorexia, improve sleep , and decrease night time anxious ruminations in the past. She is visible on unit,  day room . Mobilizing in wheel chair/crutch .   Principal Problem: MDD (major depressive disorder), recurrent severe, without psychosis ([place]) Diagnosis:   Patient Active Problem List   Diagnosis Date Noted  . MDD (major depressive disorder), recurrent severe,  without psychosis ([place]) [F33.2] 09/11/2015  . Major depressive disorder, recurrent, severe without psychotic features ([place]) [F33.2]   . Severe recurrent major depression without psychotic features ([place]) [F33.2] 06/19/2015   Total Time spent with patient: 15 minutes  Past Medical History:  Past Medical History  Diagnosis Date  . C6 spinal cord injury   . Depression     Past Surgical History  Procedure Laterality Date  . Back surgery     Family History:  Family History  Problem Relation Age of Onset  . Alcohol abuse Father   . Alcohol abuse Brother    Social History:  History  Alcohol Use  . Yes    Comment: One drink a week at most; rarely drinks     History  Drug Use No    Social History   Social History  . Marital Status: Single    Spouse Name: N/A  . Number of Children: N/A  . Years of Education: N/A   Social History Main Topics  . Smoking status: Never Smoker   . Smokeless tobacco: Never Used     Comment: No smoking hx; no need for cessation materials  . Alcohol Use: Yes     Comment: One drink a week at most; rarely drinks  . Drug Use: No  . Sexual Activity: No   Other Topics Concern  . None   Social History Narrative   Additional Social History:    Pain Medications: Pt denies  Prescriptions: Zyprexa, Klonopin, Wellbutrin Over the Counter: Pt denies History of alcohol / drug use?: No history of alcohol / drug abuse Longest period of  sobriety (when/how long): NA  Sleep:  Fair   Appetite: "I don't have an appetite"  Current Medications: Current Facility-Administered Medications  Medication Dose Route Frequency Provider Last Rate Last Dose  . acetaminophen (TYLENOL) tablet 650 mg  650 mg Oral Q6H PRN Benjamine Mola, FNP   650 mg at 09/15/15 3614  . alum & mag hydroxide-simeth (MAALOX/MYLANTA) 200-200-20 MG/5ML suspension 30 mL  30 mL Oral Q4H PRN Benjamine Mola, FNP      . buPROPion (WELLBUTRIN XL) 24 hr tablet 300 mg  300 mg Oral Daily [name], MD   300 mg at 09/15/15 0824  . clonazePAM (KLONOPIN) tablet 2 mg  2 mg Oral QHS Benjamine Mola, FNP   2 mg at 09/14/15 2204  . feeding supplement (ENSURE ENLIVE) (ENSURE ENLIVE) liquid 237 mL  237 mL Oral BID BM [name] Cobos, MD   237 mL at 09/14/15 1427  . hydrOXYzine (ATARAX/VISTARIL) tablet 50 mg  50 mg Oral TID PRN Benjamine Mola, FNP   50 mg at 09/15/15 1145  . lithium carbonate capsule 150 mg  150 mg Oral BID WC Benjamine Mola, FNP   150 mg at 09/15/15 0825  . magnesium hydroxide (MILK OF MAGNESIA) suspension 30 mL  30 mL Oral Daily PRN Benjamine Mola, FNP      . megestrol (MEGACE) 40 MG/ML suspension 400 mg  400 mg Oral Daily [name], MD   400 mg at 09/15/15 0825  . methocarbamol (ROBAXIN) tablet 750 mg  750 mg Oral Q6H PRN Benjamine Mola, FNP      . OLANZapine (ZYPREXA) tablet 2.5 mg  2.5 mg Oral QHS [name], MD   2.5 mg at 09/14/15 2203   Lab Results: No results found for this or any previous visit (from the past 48 hour(s)).  Physical Findings: AIMS: Facial and Oral Movements Muscles of Facial Expression: None, normal Lips and Perioral Area: None, normal Jaw: None, normal Tongue: None, normal,Extremity Movements Upper (arms, wrists, hands, fingers): None, normal Lower (legs, knees, ankles, toes): None, normal, Trunk Movements Neck, shoulders, hips: None, normal, Overall Severity Severity of abnormal movements (highest score from questions above): None, normal Incapacitation due to abnormal movements: None, normal Patient's awareness of abnormal movements (rate only patient's report): No Awareness, Dental Status Current problems with teeth and/or dentures?: No Does patient usually wear dentures?: No  CIWA:  CIWA-Ar Total: 0 COWS:  COWS Total Score: 0  Musculoskeletal: Strength & Muscle Tone: decreased Gait & Station: walks with cane, or mobilizes in wheelchair  Patient leans: N/A  Psychiatric Specialty Exam: ROS denies shortness of breath or  chest pain, reports some anorexia today  Blood pressure 124/83, pulse 105, temperature 98.5 F (36.9 C), temperature source Oral, resp. rate 16, height 5' 1.5" (1.562 m), weight 63.504 kg (140 lb), last menstrual period 08/07/2015, SpO2 100 %.Body mass index is 26.03 kg/(m^2).  General Appearance: improved grooming   Eye Contact::  Good  Speech:  Normal Rate  Volume:  Normal  Mood:  Depressed  Affect: Constricted, but improved compared to admission , anxious   Thought Process:  Linear  Orientation:  Full (Time, Place, and Person)  Thought Content:   No hallucinations, no delusions  Suicidal Thoughts:  Yes.  without intent/plan- chronic suicidal thoughts- at this time  Continues to deny any  Current plan , intention of hurting self,  And has identified her sense of duty to her  mother as protective  factor against  hurting self,  Contracts for safety on the unit   Homicidal Thoughts:  No  Memory:  recent and remote grossly intact   Judgement:  Fair  Insight:  Present  Psychomotor Activity:   Improving- has been more visible in day room, less isolated   Concentration:  Good  Recall:  Good  Fund of Knowledge:Good  Language: Good  Akathisia:  Negative  Handed:  Right  AIMS (if indicated):     Assets:  Communication Skills Resilience  ADL's:   Improved   Cognition: WNL  Sleep:  Number of Hours: 5.25   Assessment -  Patient has chronic depression, chronic suicidal ruminations. She is partially improved compared to admission presentation but reports ongoing depression, and feels more anxious on daily basis with ongoing worries and ruminations about her mother's health. She is on Wellbutrin XL and recent increase to 300 mgrs may be contributing to anorexia and some anxiety, but has history of responding better to this medication than to others and at this time is agreeing to continue this trial. Has history of good response to Zyprexa as augmentation and on last admission improved on  Wellbutrin/Zyprexa combination. Agrees to Zyprexa trial.  Treatment Plan Summary: Daily contact with patient to assess and evaluate symptoms and progress in treatment, Medication management, Plan inpatient treatment and medications as below  Continue  Wellbutrin XL  300 mg daily to address depression- monitor for further side effects. Start  continue Zyprexa 2.5 mgrs QHS initially, to address depression, anxiety, insomnia, mood disorder - side effects reviewed   Start Megace to assist with anorexia., as discussed with Pharmacist .   Continue Klonopin 2 mgrs QHS to address insomnia, anxiety  Continue Lithium 150 mgrs BID as antidepressant augmentation and to help decrease long term suicidal risk.  Continue Vistaril PRNs for severe anxiety if needed   Encourage increased group participation to work on coping skills and help with symptom reduction Of note, patient states she did well on ECT course, felt better on it, and had no side effects, but quickly lapsed back into depression after stopping ECT - we have discussed referring for maintenance ECT after discharge as a treatment option.   Encarnacion Slates, [place], FNP 09/15/2015, 1:59 PM I agree with assessment and plan Woodroe Chen. Sabra Heck, M.D.

## 2015-09-15 NOTE — Progress Notes (Signed)
D: Patient in the hallway sitting in her wheelchair on approach.  Patient appears sad and depressed.  Patient rates depression 9/10 and anxiety 10/10.  Patient states she has no had an appetite today.  Patient states her goal for today was to get through her visit with her mother.  Patient states she was able to get through the visit and her mother brought her books to read.  Patient states she is passive SI but verbally contracts for safety.  Patient denies HI and denies AVH. A: Staff to monitor Q 15 mins for safety.  Encouragement and support offered.  Scheduled medications administered per orders. R: Patient remains safe on the unit.  Patient attended group tonight.  Patient visible on the unit.  Patient taking adminisitered medications.

## 2015-09-16 MED ORDER — OLANZAPINE 5 MG PO TABS
5.0000 mg | ORAL_TABLET | Freq: Every day | ORAL | Status: DC
  Administered 2015-09-16 – 2015-09-17 (×2): 5 mg via ORAL
  Filled 2015-09-16 (×4): qty 1

## 2015-09-16 MED ORDER — LITHIUM CARBONATE 150 MG PO CAPS
150.0000 mg | ORAL_CAPSULE | Freq: Three times a day (TID) | ORAL | Status: DC
  Administered 2015-09-17 – 2015-09-25 (×26): 150 mg via ORAL
  Filled 2015-09-16 (×33): qty 1

## 2015-09-16 NOTE — Progress Notes (Signed)
Patient ID: HOUSTON SURGES, female   DOB: 1971/05/14, 44 y.o.   MRN: [id number]  Pt currently presents with a flat affect and manipulative, needy behavior. Pt reports a negative attitude towards self and recovery this morning and worries that "he (the doctor) won't think I'm doing well enough." Per self inventory, pt rates depression at a 7-8, hopelessness 10+ and anxiety 10+. Pt's daily goal is to "drink at least one ensure."  Pt reports poor sleep, a poor appetite, low energy and good concentration. Pt complains of "anxiety and chest pain" at various times during the day. Pts vitals monitored, pt given 1:1 and pt states "Ok, I'm sorry, I'm better now."  Pt provided with medications per providers orders. Pt's labs and vitals were monitored throughout the day. Pt supported emotionally and encouraged to express concerns and questions. Pt educated on medications, alternative stress techniques and diet/nutrition.  Pt's safety ensured with 15 minute and environmental checks. Pt currently denies HI and A/V hallucinations. Pt verbally agrees to seek staff if HI or A/VH occurs. Pt endorses SI but "never while I'm here [facility])." Pt also agrees to consult with staff before acting on harmful thoughts. Will continue POC.

## 2015-09-16 NOTE — Progress Notes (Signed)
Adult Psychoeducational Group Note  Date:  09/16/2015 Time:  9:28 PM  Group Topic/Focus:  Wrap-Up Group:   The focus of this group is to help patients review their daily goal of treatment and discuss progress on daily workbooks.  Participation Level:  Active  Participation Quality:  Appropriate  Affect:  Appropriate  Cognitive:  Appropriate  Insight: Appropriate  Engagement in Group:  Engaged  Modes of Intervention:  Discussion  Additional Comments: The patient expressed that she attended group.The patient also said group was about excepting diagnosis.  Octavio Manns 09/16/2015, 9:28 PM

## 2015-09-16 NOTE — Progress Notes (Signed)
D- Patient found in room upon approach. Patient denies SI/ HI/AVH and pain. Contracts for safety during inpatient stay and states that if she had the means to harm herself that she would not in this current moment. States that this is the first time in the long time she has decreased suicidal ideation.  Patient rates depression a 8/10  and anxiety a 10/10 and claims that she feels "fragile" but stated, "Once I talked to the doctor and called my mother tonight I feel reassured".  A- Nurse provided reassurance and helped maintain a safe environment. Given scheduled medications. Patient monitored every 15 minutes for safety.  R-  Patient visible on the unit, attended evening group, took all scheduled medications and remained safe.

## 2015-09-16 NOTE — [facility] Group Notes (Signed)
[facility] LCSW Group Therapy 09/16/2015 1:15 PM  Type of Therapy: Group Therapy- Feelings about Diagnosis  Participation Level: Active   Participation Quality:  Appropriate  Affect:  Flat  Cognitive: Alert and Oriented   Insight:  Developing   Engagement in Therapy: Developing/Improving and Engaged   Modes of Intervention: Clarification, Confrontation, Discussion, Education, Exploration, Limit-setting, Orientation, Problem-solving, Rapport Building, Dance movement psychotherapist, Socialization and Support  Description of Group:   This group will allow patients to explore their thoughts and feelings about diagnoses they have received. Patients will be guided to explore their level of understanding and acceptance of these diagnoses. Facilitator will encourage patients to process their thoughts and feelings about the reactions of others to their diagnosis, and will guide patients in identifying ways to discuss their diagnosis with significant others in their lives. This group will be process-oriented, with patients participating in exploration of their own experiences as well as giving and receiving support and challenge from other group members.  Summary of Progress/Problems:  Pt continues to present with flat affect and depressed mood but participates actively in therapy. Pt maintains low volume and rate of speech. Pt is also self-deprecating in her participation, with negative self-talk and has difficulty receiving positive feedback from peers. Pt identified feelings of embarrassment related to her interactions with family due to her diagnosis. She reports in turn feeling high levels of guilt, also because she "did this to her self" in reference to her previous suicide attempt.   Therapeutic Modalities:   Cognitive Behavioral Therapy Solution Focused Therapy Motivational Interviewing Relapse Prevention Therapy  Chad Cordial, LCSWA 09/16/2015 2:59 PM
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
## 2015-09-16 NOTE — Plan of Care (Signed)
Problem: Diagnosis: Increased Risk For Suicide Attempt Goal: LTG-Patient Will Report Improved Mood and Deny Suicidal LTG (by discharge) Patient will report improved mood and deny suicidal ideation.  Outcome: Progressing Patient states that she does not have suicidal ideation and that she would not harm herself even if given the means to do so. States that this is the first time in a long time she has felt this way. Anxious mood has improved.

## 2015-09-16 NOTE — Tx Team (Signed)
Interdisciplinary Treatment Plan Update (Adult) Date: 09/16/2015   Date: 09/16/2015 8:38 AM  Progress in Treatment:  Attending groups: Yes  Participating in groups: Yes, minimally   Taking medication as prescribed: Yes  Tolerating medication: Yes  Family/Significant othe contact made: No, CSW attempting to make contact with mother Patient understands diagnosis: Yes AEB seeking help for depression Discussing patient identified problems/goals with staff: Yes  Medical problems stabilized or resolved: Yes  Denies suicidal/homicidal ideation: No, Pt endorses passive Patient has not harmed self or Others: Yes   New problem(s) identified: None identified at this time.   Discharge Plan or Barriers: CSW will assess for appropriate discharge plan and relevant barriers.   09/16/15: Pt is considering outpatient E[facility] at [facility] and an IOP program  Additional comments: n/a   Reason for Continuation of Hospitalization:  Anxiety Depression Medication stabilization Suicidal ideation  Estimated length of stay: 3-5 days  Review of initial/current patient goals per problem list:   1.  Goal(s): Patient will participate in aftercare plan  Met:  Progressing  Target date: 3-5 days from date of admission   As evidenced by: Patient will participate within aftercare plan AEB aftercare provider and housing plan at discharge being identified.  09/11/15: CSW to work with Pt to assess for appropriate discharge plan and faciliate appointments and referrals as needed prior to d/c. 09/16/15: CSW made referral to [place]; pt will return home and would like to follow-up with an IOP program.  2.  Goal (s): Patient will exhibit decreased depressive symptoms and suicidal ideations.  Met:  No  Target date: 3-5 days from date of admission   As evidenced by: Patient will utilize self rating of depression at 3 or below and demonstrate decreased signs of depression or be deemed stable for discharge by  MD. 09/11/15: Pt was admitted with symptoms of depression, rating 10/10. Pt continues to present with flat affect and depressive symptoms.  Pt will demonstrate decreased symptoms of depression and rate depression at 3/10 or lower prior to discharge. 09/16/15: Pt rating depression at "11/10"; endorses passive SI  3.  Goal(s): Patient will demonstrate decreased signs and symptoms of anxiety.  Met:  No  Target date: 3-5 days from date of admission   As evidenced by: Patient will utilize self rating of anxiety at 3 or below and demonstrated decreased signs of anxiety, or be deemed stable for discharge by MD 09/11/15: Pt was admitted with increased levels of anxiety and is currently rating those symptoms highly. Pt will demonstrated decreased symptoms of anxiety and rate it at 3/10 prior to d/c. 09/16/15: Pt rates anxiety at "11/10" and expresses that her anxiety is not improving.  Attendees:  Patient:    Family:    Physician: Dr. Parke Poisson, MD  09/16/2015 8:38 AM  Nursing: Lars Pinks, RN Case manager  09/16/2015 8:38 AM  Clinical Social Worker Norman Clay, MSW 09/16/2015 8:38 AM  Other: Jake Bathe Liasion 09/16/2015 8:38 AM  Clinical: Marcella Dubs, RN 09/16/2015 8:38 AM  Other: , RN Charge Nurse 09/16/2015 8:38 AM  Other:     Peri Maris, Latanya Presser MSW

## 2015-09-16 NOTE — Progress Notes (Signed)
Patient ID: Natasha [name], female   DOB: 29-Aug-1971, 44 y.o.   MRN: [id number] Adult Psychoeducational Group Note  Date:  09/16/2015 Time: 09:00am  Group Topic/Focus:  Goals Group:   The focus of this group is to help patients establish daily goals to achieve during treatment and discuss how the patient can incorporate goal setting into their daily lives to aide in recovery.  Participation Level:  Active  Participation Quality:  Appropriate  Affect:  Flat  Cognitive:  Appropriate  Insight: Appropriate  Engagement in Group:  Engaged  Modes of Intervention:  Discussion, Education, Orientation and Support  Additional Comments:  Pt able to identify daily goal to accomplish with treatment team today.  Aurora Mask 09/16/2015, 9:38 AM

## 2015-09-16 NOTE — Progress Notes (Signed)
Recreation Therapy Notes  Animal-Assisted Activity (AAA) Program Checklist/Progress Notes Patient Eligibility Criteria Checklist & Daily Group note for Rec Tx Intervention  Date: 10.04.2016 Time: 2:45pm Location: [address]    AAA/T Program Assumption of Risk Form signed by Patient/ or Parent Legal Guardian yes  Patient is free of allergies or sever asthma yes  Patient reports no fear of animals yes  Patient reports no history of cruelty to animals yes  Patient understands his/her participation is voluntary yes  Patient washes hands before animal contact yes  Patient washes hands after animal contact yes  Behavioral Response: Attentive  Education: Hand Washing, Appropriate Animal Interaction   Education Outcome: Acknowledges education.   Clinical Observations/Feedback: Patient actively engaged in session, petting therapy dog appropriately and interacting with peers appropriately.   [name] [name], LRT/CTRS  [name] L 09/16/2015 3:14 PM

## 2015-09-17 NOTE — Progress Notes (Signed)
Recreation Therapy Notes   Date: 10.05.2016 Time: 9:30am Location: [address]   Group Topic: Stress Management  Goal Area(s) Addresses:  Patient will actively participate in stress management techniques presented during session.   Behavioral Response: Did not attend.   [name] Eland Lamantia, LRT/CTRS        Eryanna Regal L 09/17/2015 1:15 PM

## 2015-09-17 NOTE — Progress Notes (Signed)
D: Per patient self inventory form pt reports she slept fair last. She reports a poor appetite, low energy level, good concentration. She rates depression 6/10, hopelessness 6/10, anxiety 9/10- all on 0-10 scale, 10 being the worse. Pt reports passive SI, reports " I would never act on these thoughts." Pt able to verbally contract for safety. Denies HI. Denies AVH. Pt reports she will "work on her coping skills." Pt denies physical pain. Using wheelchair to assist with ambulation.  A:Special checks q 15 mins in place for safety.  Medication administered per MD order(See eMAR). Encouragement and support provided. Pt encouraged to drink fluids and eat meals.  R:Safety maintained. No falls. Pt able to verbally contract for safety. Will continue to monitor.

## 2015-09-17 NOTE — Progress Notes (Signed)
D: Patient alert and oriented x 4. Patient denies pain/SI/HI/AVH. Patient uses wheelchair on the unit and in room. Patient states earlier in the day she was having passive SI but would never act out on thoughts. Will continue to monitor.  A: Staff to monitor Q 15 mins for safety. Encouragement and support offered. Scheduled medications administered per orders. R: Patient remains safe on the unit. Patient attended group tonight. Patient visible on hte unit and interacting with peers. Patient taking administered medications.

## 2015-09-17 NOTE — [facility] Group Notes (Addendum)
[facility] LCSW Aftercare Discharge Planning Group Note  09/17/2015 8:45 AM  Participation Quality: Alert, Appropriate and Oriented  Mood/Affect: Improving, some brighter  Depression Rating: 6/7  Anxiety Rating: 9/10  Thoughts of Suicide: Pt endorses chronic SI but reports no intent while hospitalized  Will you contract for safety? Yes  Current AVH: Pt denies  Plan for Discharge/Comments: Pt attended discharge planning group and actively participated in group. CSW discussed suicide prevention education with the group and encouraged them to discuss discharge planning and any relevant barriers. Pt presents with brighter affect today and is more interactive with peers. She reports that her anxiety seems to be more problematic this admission over her depression. Pt is requesting that all of her aftercare be here at [facility] due to transportation concerns.  Transportation Means: Pt reports access to transportation  Supports: No supports mentioned at this time  Chad Cordial, LCSWA 09/17/2015 9:22 AM

## 2015-09-17 NOTE — Progress Notes (Signed)
Patient ID: Natasha [name], female   DOB: 09-27-1971, 44 y.o.   MRN: [id number] [facility] MD Progress Note  09/17/2015 5:58 PM Natasha [name]  MRN:  [id number] Subjective:   She reports ongoing depression, and states " today I have been feeling more depressed, I do not know why". She denies suicidal plans /intentions at this time and contracts for safety on unit, but as noted in previous notes, endorses chronic ruminations about death , dying . She is describing worsening anorexia-  I have discussed case with Nursing staff- patient not eating regular meals, but has accepted and drank 2 ensure supplements today.   Objective : I have discussed case with treatment team and have met with patient. Patient visible on unit. She has been going to some groups and interacts with other patients, although in a limited way. No disruptive or agitated behaviors on unit. She reports that Wellbutrin seems to be helping, and although has felt more depressed today, does state that overall medication seems to be addressing her mood . She is aware that Wellbutrin can cause anorexia as side effect, but at this time is reluctant to taper it off /decrease dose. Denies any other side effects from this medication. She is tolerating Zyprexa well- no akathisia, no excessive sedation. Patient spoke with [facility] coordinator, states " I think I would like to stick with ECT rather than TMCS". As noted, she responded well to ECT trial in the past, but mood worsened fairly rapidly after ECT stopped, leading to recommendation of ongoing maintenance ECT as an ongoing antidepressant strategy. Fingerstick blood glucose level done - 89.    Principal Problem: MDD (major depressive disorder), recurrent severe, without psychosis ([place]) Diagnosis:   Patient Active Problem List   Diagnosis Date Noted  . MDD (major depressive disorder), recurrent severe, without psychosis ([place]) [F33.2] 09/11/2015  . Major depressive disorder, recurrent, severe  without psychotic features ([place]) [F33.2]   . Severe recurrent major depression without psychotic features ([place]) [F33.2] 06/19/2015   Total Time spent with patient: 25 minutes    Past Medical History:  Past Medical History  Diagnosis Date  . C6 spinal cord injury   . Depression     Past Surgical History  Procedure Laterality Date  . Back surgery     Family History:  Family History  Problem Relation Age of Onset  . Alcohol abuse Father   . Alcohol abuse Brother     Social History:  History  Alcohol Use  . Yes    Comment: One drink a week at most; rarely drinks     History  Drug Use No    Social History   Social History  . Marital Status: Single    Spouse Name: N/A  . Number of Children: N/A  . Years of Education: N/A   Social History Main Topics  . Smoking status: Never Smoker   . Smokeless tobacco: Never Used     Comment: No smoking hx; no need for cessation materials  . Alcohol Use: Yes     Comment: One drink a week at most; rarely drinks  . Drug Use: No  . Sexual Activity: No   Other Topics Concern  . None   Social History Narrative   Additional Social History:    Pain Medications: Pt denies  Prescriptions: Zyprexa, Klonopin, Wellbutrin Over the Counter: Pt denies History of alcohol / drug use?: No history of alcohol / drug abuse Longest period of sobriety (when/how long): NA  Sleep:  Fair   Appetite:   Poor today  Current Medications: Current Facility-Administered Medications  Medication Dose Route Frequency Provider Last Rate Last Dose  . acetaminophen (TYLENOL) tablet 650 mg  650 mg Oral Q6H PRN Benjamine Mola, FNP   650 mg at 09/15/15 1436  . alum & mag hydroxide-simeth (MAALOX/MYLANTA) 200-200-20 MG/5ML suspension 30 mL  30 mL Oral Q4H PRN Benjamine Mola, FNP      . buPROPion (WELLBUTRIN XL) 24 hr tablet 300 mg  300 mg Oral Daily [name], MD   300 mg at 09/17/15 0813  . clonazePAM (KLONOPIN) tablet 2 mg  2 mg Oral QHS Benjamine Mola, FNP   2 mg at 09/16/15 2201  . feeding supplement (ENSURE ENLIVE) (ENSURE ENLIVE) liquid 237 mL  237 mL Oral BID BM [name] Feliz Herard, MD   237 mL at 09/17/15 1445  . hydrOXYzine (ATARAX/VISTARIL) tablet 50 mg  50 mg Oral TID PRN Benjamine Mola, FNP   50 mg at 09/16/15 1815  . lithium carbonate capsule 150 mg  150 mg Oral TID WC [name], MD   150 mg at 09/17/15 1701  . magnesium hydroxide (MILK OF MAGNESIA) suspension 30 mL  30 mL Oral Daily PRN Benjamine Mola, FNP      . megestrol (MEGACE) 40 MG/ML suspension 400 mg  400 mg Oral Daily [name], MD   400 mg at 09/17/15 0813  . methocarbamol (ROBAXIN) tablet 750 mg  750 mg Oral Q6H PRN Benjamine Mola, FNP      . OLANZapine (ZYPREXA) tablet 5 mg  5 mg Oral QHS [name], MD   5 mg at 09/16/15 2201  . SUMAtriptan (IMITREX) tablet 25 mg  25 mg Oral Q2H PRN Encarnacion Slates, NP   25 mg at 09/15/15 1558    Lab Results: No results found for this or any previous visit (from the past 48 hour(s)).  Physical Findings: AIMS: Facial and Oral Movements Muscles of Facial Expression: None, normal Lips and Perioral Area: None, normal Jaw: None, normal Tongue: None, normal,Extremity Movements Upper (arms, wrists, hands, fingers): None, normal Lower (legs, knees, ankles, toes): None, normal, Trunk Movements Neck, shoulders, hips: None, normal, Overall Severity Severity of abnormal movements (highest score from questions above): None, normal Incapacitation due to abnormal movements: None, normal Patient's awareness of abnormal movements (rate only patient's report): No Awareness, Dental Status Current problems with teeth and/or dentures?: No Does patient usually wear dentures?: No  CIWA:  CIWA-Ar Total: 0 COWS:  COWS Total Score: 0  Musculoskeletal: Strength & Muscle Tone: decreased Gait & Station: walks with cane, or mobilizes in wheelchair  Patient leans: N/A  Psychiatric Specialty Exam: ROS denies shortness of breath  or chest pain, reports  anorexia , no nausea, no vomiting   Blood pressure 121/89, pulse 117, temperature 98.6 F (37 C), temperature source Oral, resp. rate 18, height 5' 1.5" (1.562 m), weight 140 lb (63.504 kg), last menstrual period 08/07/2015, SpO2 100 %.Body mass index is 26.03 kg/(m^2).  General Appearance: improved grooming   Eye Contact::  Good  Speech:  Normal Rate  Volume:  Normal  Mood:   Depressed   Affect:    Constricted ,  But does smile at times appropriately  Thought Process:  Linear  Orientation:  Full (Time, Place, and Person)  Thought Content:   No hallucinations, no delusions  Suicidal Thoughts:  Yes.  without intent/plan- chronic suicidal thoughts-denies plans or intentions  of hurting self and is able to contract for safety on the unit at present   Homicidal Thoughts:  No  Memory:  recent and remote grossly intact   Judgement:  Fair  Insight:  Present  Psychomotor Activity:   Improving- has been more visible in day room, less isolated   Concentration:  Good  Recall:  Good  Fund of Knowledge:Good  Language: Good  Akathisia:  Negative  Handed:  Right  AIMS (if indicated):     Assets:  Communication Skills Resilience  ADL's:   Improved   Cognition: WNL  Sleep:  Number of Hours: 4.5  Assessment - Remains depressed, has chronic suicidal ideations, at this time without plan or intention, and is able to contract for safety on unit . She is presenting with a slowly improved range of affect. She feels Wellbutrin XL trial is helping but it is likely causing or contributing to anorexia. She wants to continue regimen and in the past, Zyprexa has helped counteract anorexia and also help with sleep.    Treatment Plan Summary: Daily contact with patient to assess and evaluate symptoms and progress in treatment, Medication management, Plan inpatient treatment and medications as below  Continue  Wellbutrin XL  300 mgrs QDAY to address depression- monitor for  Worsening  anorexia  Continue Zyprexa  5  mgrs QHS -  to address depression, anxiety, insomnia, mood disorder - side effects reviewed  Continue Megace  to assist with anorexia. Continue Klonopin 2 mgrs QHS to address insomnia, anxiety Continue  Lithium 150 mgrs TID as antidepressant augmentation and to help decrease long term suicidal risk. Continue Vistaril PRNs for severe anxiety if needed  Encourage increased group participation to work on coping skills and help with symptom reduction Consider outpatient ECT - maintenance ECT - as part of ongoing outpatient treatment after discharge, based on history of good response and no side effects from this treatment modality in the past . Obtain PT consult to help address ambulation difficulties . Monitor BMP in AM , due to anorexia, decreased PO intake .   Alleya Demeter, [place] 09/17/2015, 5:58 PM

## 2015-09-17 NOTE — Progress Notes (Signed)
Pt reports a poor appetite, MD notified

## 2015-09-17 NOTE — Progress Notes (Signed)
Pt stated that she had a rough day. She was not able to reach her goal today, but tomorrow she plans to be more positive.

## 2015-09-17 NOTE — [facility] Group Notes (Signed)
[facility] LCSW Group Therapy 09/17/2015 1:15 PM  Type of Therapy: Group Therapy- Emotion Regulation  Participation Level: Active   Participation Quality:  Appropriate  Affect: Flat and Tearful  Cognitive: Alert and Oriented   Insight:  Developing/Improving  Engagement in Therapy: Developing/Improving and Engaged   Modes of Intervention: Clarification, Confrontation, Discussion, Education, Exploration, Limit-setting, Orientation, Problem-solving, Rapport Building, Dance movement psychotherapist, Socialization and Support  Summary of Progress/Problems: The topic for group today was emotional regulation. This group focused on both positive and negative emotion identification and allowed group members to process ways to identify feelings, regulate negative emotions, and find healthy ways to manage internal/external emotions. Group members were asked to reflect on a time when their reaction to an emotion led to a negative outcome and explored how alternative responses using emotion regulation would have benefited them. Group members were also asked to discuss a time when emotion regulation was utilized when a negative emotion was experienced. Pt was actively involved in group discussion, however reported feeling "deeply sad" in "a way I have never felt before." Pt described guilt as the most difficult feeling to regulate as she has a constant inner dialogue which reminds her of her guilt. She was able to process how guilt provides no benefit but had difficulty identifying why she cannot move on from the feeling. She eventually bent down and began crying. She continues to be self-deprecating, frequently apologizing to the group for being disruptive even when her behavior is very appropriate.    Chad Cordial, LCSWA 09/17/2015 3:56 PM

## 2015-09-18 LAB — BASIC METABOLIC PANEL
Anion gap: 10 (ref 5–15)
BUN: 13 mg/dL (ref 6–20)
CO2: 20 mmol/L — ABNORMAL LOW (ref 22–32)
Calcium: 9.2 mg/dL (ref 8.9–10.3)
Chloride: 105 mmol/L (ref 101–111)
Creatinine, Ser: 0.67 mg/dL (ref 0.44–1.00)
GFR calc Af Amer: >60 mL/min (ref >60)
GFR calc non Af Amer: >60 mL/min (ref >60)
Glucose, Bld: 73 mg/dL (ref 65–99)
Potassium: 3.8 mmol/L (ref 3.5–5.1)
Sodium: 135 mmol/L (ref 135–145)

## 2015-09-18 LAB — GLUCOSE, CAPILLARY: Glucose-Capillary: 89 mg/dL (ref 65–99)

## 2015-09-18 MED ORDER — OLANZAPINE 7.5 MG PO TABS: 7.5000 mg | ORAL_TABLET | Freq: Every day | ORAL | Status: DC

## 2015-09-18 MED ORDER — ENSURE ENLIVE PO LIQD
237.0000 mL | Freq: Three times a day (TID) | ORAL | Status: DC
  Administered 2015-09-18 – 2015-09-24 (×16): 237 mL via ORAL

## 2015-09-18 MED ORDER — OLANZAPINE 10 MG PO TABS
10.0000 mg | ORAL_TABLET | Freq: Every day | ORAL | Status: DC
  Administered 2015-09-18 – 2015-09-24 (×7): 10 mg via ORAL
  Filled 2015-09-18 (×9): qty 1

## 2015-09-18 NOTE — Progress Notes (Signed)
Patient ID: Natasha [name], female   DOB: [birth year], 44 y.o.   MRN: [id number]  Pt placed on a 1:1 per MD orders due to self injury. Pt seen by Clinical research associate in dayroom hunched over in chair. Writer called to MHT from med room, MHT went to pt. Pt reports she had banged her head against the leg of her wheelchair. Pt states "I just feel really bad, like I did yesterday." Pt tearful. Small erythematic petechiae noted on pts forehead. Pt given prn anxiolytic and and ice pack. Pt denies any pain, shakes head and states "I'm okay." Pt could not verbally contract to contact staff before acting harming herself. MD notified.   Sitter currently at side in the dayroom. Pt in no current distress. Flat affect noted. Pt breathing even and unlabored. Will continue to monitor.

## 2015-09-18 NOTE — [facility] Group Notes (Signed)
[facility] Group Therapy 09/18/2015 1:15pm  Type of Therapy: Mental Health Association Presentation  Participation Level: Active  Participation Quality: Attentive  Affect: Appropriate  Cognitive: Oriented  Insight: Developing/Improving  Engagement in Therapy: Engaged  Modes of Intervention: Discussion, Education and Socialization  Summary of Progress/Problems: Mental Health Association (MHA) Speaker came to talk about his personal journey with substance abuse and addiction. The pt processed ways by which to relate to the speaker. MHA speaker provided handouts and educational information pertaining to groups and services offered by the [facility]. Pt was engaged in speaker's presentation and was receptive to resources provided.    Chad Cordial, LCSWA 09/18/2015 1:28 PM

## 2015-09-18 NOTE — Progress Notes (Addendum)
1615  1:1  Patient sitting in chair in dayroom.  Stated she felt she deserved to hurt herself for things that she had done.  Patient has 1:1 in dayroom for safety.  Physical therapy visited with patient and walked her down the hallway.  Respirations even and unlabored.  No signs/symptoms of pain/distress noted on patient's face/body movements.  1:1 continues for safety per MD orders.  1725  1:1  Patient sitting in wheelchair in dayroom with 1:1 for safety.  Patient denied pain.  Patient's weight is 131 lbs.  Respirations even and unlabored.  No signs/symptoms of pain/distress noted on patient's face/body movements.  Safety maintained with 1:1 for safety.   Patient's self inventory sheet, patient slept good last night, no sleep medication given.  Poor appetite, low energy level, poor concentration.  Rated depression 9, hopeless 10, anxiety 6-7.  Denied withdrawals.  SI, off/on, no plan, contracts for safety.  Denied physical problems.  Denied pain.  Goal is to have less SI.  Plans to talk in groups and individually.  Does have discharge plans.

## 2015-09-18 NOTE — [facility] Suicide Risk Assessment (Signed)
[facility] INPATIENT:  Family/Significant Other Suicide Prevention Education  Suicide Prevention Education:  Education Completed; Teira Arcilla, Pt's mother [phone]), has been identified by the patient as the family member/significant other with whom the patient will be residing, and identified as the person(s) who will aid the patient in the event of a mental health crisis (suicidal ideations/suicide attempt).  With written consent from the patient, the family member/significant other has been provided the following suicide prevention education, prior to the and/or following the discharge of the patient.  The suicide prevention education provided includes the following:  Suicide risk factors  Suicide prevention and interventions  National Suicide Hotline telephone number  [facility] assessment telephone number  [facility] Emergency Assistance 911  [facility] and/or Residential Mobile Crisis Unit telephone number  Request made of family/significant other to:  Remove weapons (e.g., guns, rifles, knives), all items previously/currently identified as safety concern.    Remove drugs/medications (over-the-counter, prescriptions, illicit drugs), all items previously/currently identified as a safety concern.  The family member/significant other verbalizes understanding of the suicide prevention education information provided.  The family member/significant other agrees to remove the items of safety concern listed above.  Elaina Hoops 09/18/2015, 8:57 AM

## 2015-09-18 NOTE — Progress Notes (Signed)
Patient ID: Natasha [name], female   DOB: 1971/08/18, 44 y.o.   MRN: [id number] [facility] MD Progress Note  09/18/2015 9:04 AM Natasha [name]  MRN:  [id number] Subjective:   Reports ongoing depression, at times severe, with ongoing ruminations about death, but without plan or intention of hurting self at this time.  Struggles with sense of low self esteem, apprehension about mother's declining health. Patient denies medication side effects- she has developed anorexia, but states that this may not be solely a side effect from Wellbutrin, but also is insightful - says " eating is one of the few things I have real  control over in my life , so maybe I just do not eat to have that control". Of note, she has no concerns about her actual weight or physical appearance and states anorexia is not related to feeling overweight.  Objective : I have discussed case with treatment team and have met with patient. Patient visible on unit. Going to groups . No self injurious or suicidal behaviors on unit and has been able to contract for safety on the unit.  Tends to ruminate about mother's declining health, but as discussed with CSW, who has spoken with mother, mother is still quite independent, driving, and told CSW she is still riding her horse at times . As reviewed with team, patient's concern/report that mother is severely ill with Parkinson's disease may be related to catastrophic thinking related to her chronic, severe depression.  She  States she likes her current combination of Zyprexa and Wellbutrin. States that she had done better on higher doses of Zyprexa in the past, and does not remember having had side effects. Although still severely depressed, seems to be more reactive in affect and more amenable to reviewing coping skills, strategies to address  Mood , thinking patterns that perpetuate depression.  No akathisia or excessive sedation noted    As discussed with staff patient's food intake has been poor but  she has been drinking fluids consistently and has been drinking ensure supplements twice a day.  BMP, electrolytes unremarakable/ WNL.   Principal Problem: MDD (major depressive disorder), recurrent severe, without psychosis ([place]) Diagnosis:   Patient Active Problem List   Diagnosis Date Noted  . MDD (major depressive disorder), recurrent severe, without psychosis ([place]) [F33.2] 09/11/2015  . Major depressive disorder, recurrent, severe without psychotic features ([place]) [F33.2]   . Severe recurrent major depression without psychotic features ([place]) [F33.2] 06/19/2015   Total Time spent with patient: 25 minutes    Past Medical History:  Past Medical History  Diagnosis Date  . C6 spinal cord injury   . Depression     Past Surgical History  Procedure Laterality Date  . Back surgery     Family History:  Family History  Problem Relation Age of Onset  . Alcohol abuse Father   . Alcohol abuse Brother     Social History:  History  Alcohol Use  . Yes    Comment: One drink a week at most; rarely drinks     History  Drug Use No    Social History   Social History  . Marital Status: Single    Spouse Name: N/A  . Number of Children: N/A  . Years of Education: N/A   Social History Main Topics  . Smoking status: Never Smoker   . Smokeless tobacco: Never Used     Comment: No smoking hx; no need for cessation materials  . Alcohol Use: Yes  Comment: One drink a week at most; rarely drinks  . Drug Use: No  . Sexual Activity: No   Other Topics Concern  . None   Social History Narrative   Additional Social History:    Pain Medications: Pt denies  Prescriptions: Zyprexa, Klonopin, Wellbutrin Over the Counter: Pt denies History of alcohol / drug use?: No history of alcohol / drug abuse Longest period of sobriety (when/how long): NA  Sleep:  Fair   Appetite:   Poor today  Current Medications: Current Facility-Administered Medications  Medication Dose Route  Frequency Provider Last Rate Last Dose  . acetaminophen (TYLENOL) tablet 650 mg  650 mg Oral Q6H PRN Benjamine Mola, FNP   650 mg at 09/15/15 1436  . alum & mag hydroxide-simeth (MAALOX/MYLANTA) 200-200-20 MG/5ML suspension 30 mL  30 mL Oral Q4H PRN Benjamine Mola, FNP      . buPROPion (WELLBUTRIN XL) 24 hr tablet 300 mg  300 mg Oral Daily [name], MD   300 mg at 09/18/15 0809  . clonazePAM (KLONOPIN) tablet 2 mg  2 mg Oral QHS Benjamine Mola, FNP   2 mg at 09/17/15 2303  . feeding supplement (ENSURE ENLIVE) (ENSURE ENLIVE) liquid 237 mL  237 mL Oral TID BM Faolan [place] A Diasia Henken, MD      . hydrOXYzine (ATARAX/VISTARIL) tablet 50 mg  50 mg Oral TID PRN Benjamine Mola, FNP   50 mg at 09/16/15 1815  . lithium carbonate capsule 150 mg  150 mg Oral TID WC [name], MD   150 mg at 09/18/15 0645  . magnesium hydroxide (MILK OF MAGNESIA) suspension 30 mL  30 mL Oral Daily PRN Benjamine Mola, FNP      . methocarbamol (ROBAXIN) tablet 750 mg  750 mg Oral Q6H PRN Benjamine Mola, FNP      . OLANZapine (ZYPREXA) tablet 7.5 mg  7.5 mg Oral QHS [name], MD      . SUMAtriptan (IMITREX) tablet 25 mg  25 mg Oral Q2H PRN Encarnacion Slates, NP   25 mg at 09/17/15 2304    Lab Results:  Results for orders placed or performed during the hospital encounter of 09/11/15 (from the past 48 hour(s))  Glucose, capillary     Status: None   Collection Time: 09/17/15  5:05 PM  Result Value Ref Range   Glucose-Capillary 89 65 - 99 mg/dL  Basic metabolic panel     Status: Abnormal   Collection Time: 09/18/15  6:25 AM  Result Value Ref Range   Sodium 135 135 - 145 mmol/L   Potassium 3.8 3.5 - 5.1 mmol/L   Chloride 105 101 - 111 mmol/L   CO2 20 (L) 22 - 32 mmol/L   Glucose, Bld 73 65 - 99 mg/dL   BUN 13 6 - 20 mg/dL   Creatinine, Ser 0.67 0.44 - 1.00 mg/dL   Calcium 9.2 8.9 - 10.3 mg/dL   GFR calc non Af Amer >60 >60 mL/min   GFR calc Af Amer >60 >60 mL/min    Comment: (NOTE) The eGFR has been  calculated using the CKD EPI equation. This calculation has not been validated in all clinical situations. eGFR's persistently <60 mL/min signify possible Chronic Kidney Disease.    Anion gap 10 5 - 15    Comment: Performed at [facility]    Physical Findings: AIMS: Facial and Oral Movements Muscles of Facial Expression: None, normal Lips and Perioral Area: None,  normal Jaw: None, normal Tongue: None, normal,Extremity Movements Upper (arms, wrists, hands, fingers): None, normal Lower (legs, knees, ankles, toes): None, normal, Trunk Movements Neck, shoulders, hips: None, normal, Overall Severity Severity of abnormal movements (highest score from questions above): None, normal Incapacitation due to abnormal movements: None, normal Patient's awareness of abnormal movements (rate only patient's report): No Awareness, Dental Status Current problems with teeth and/or dentures?: No Does patient usually wear dentures?: No  CIWA:  CIWA-Ar Total: 0 COWS:  COWS Total Score: 0  Musculoskeletal: Strength & Muscle Tone: decreased Gait & Station: walks with cane, or mobilizes in wheelchair  Patient leans: N/A  Psychiatric Specialty Exam: ROS denies shortness of breath or chest pain, reports  anorexia , no nausea, no vomiting   Blood pressure 115/86, pulse 105, temperature 98 F (36.7 C), temperature source Oral, resp. rate 16, height 5' 1.5" (1.562 m), weight 140 lb (63.504 kg), last menstrual period 08/07/2015, SpO2 100 %.Body mass index is 26.03 kg/(m^2).  General Appearance: improved grooming   Eye Contact::  Good  Speech:  Normal Rate  Volume:  Normal  Mood:    Reports ongoing , severe depression  Affect:     Gradually presenting less constricted in affect   Thought Process:  Linear  Orientation:  Full (Time, Place, and Person)  Thought Content:   No hallucinations, no delusions  Suicidal Thoughts:  Yes.  without intent/plan- chronic suicidal thoughts-denies plans  or intentions of hurting self and is able to contract for safety on the unit at present   Homicidal Thoughts:  No  Memory:  recent and remote grossly intact   Judgement:  Fair  Insight:  Present  Psychomotor Activity:   Improving- has been more visible in day room, less isolated   Concentration:  Good  Recall:  Good  Fund of Knowledge:Good  Language: Good  Akathisia:  Negative  Handed:  Right  AIMS (if indicated):     Assets:  Communication Skills Resilience  ADL's:   Improved   Cognition: WNL  Sleep:  Number of Hours: 4.5  Assessment -  Patient continues to report severe depression, low self esteem, low energy, and chronic, currently passive, suicidal thoughts. At this time denying any plan or intention of hurting self and able to contract for safety on the unit . Denies medication side effects , and likes Zyprexa, Wellbutrin XL combination. Anorexia may be partially related to Wellbutrin but as noted by patient may be more volitional . She is drinking fluids, drinking ensure supplements ,and electrolytes are WNL.  Tends to ruminate about mother 's health and describes her as having severe , debilitating Parkinson's , but report from mother to CSW is that she is still independent and able to function in daily activities . She is becoming more amenable to support, feedback, helping her recognize and address automatic negative thoughts, tendency towards negative thinking.    Treatment Plan Summary: Daily contact with patient to assess and evaluate symptoms and progress in treatment, Medication management, Plan inpatient treatment and medications as below  Continue  Wellbutrin XL  300 mgrs QDAY to address depression- monitor for  Worsening anorexia  Increase Zyprexa  To 10   mgrs QHS -  to address depression, anxiety, insomnia, mood disorder - side effects reviewed  Discontinue Megace, patient states she does not want to take it, cannot tolerate taste or nausea it causes  Continue Klonopin  2 mgrs QHS to address insomnia, anxiety Continue  Lithium 150 mgrs TID as antidepressant augmentation  and to help decrease long term suicidal risk. Continue Vistaril PRNs for severe anxiety if needed  Continue Ensure supplementation.  Encourage increased group participation to work on Radiographer, therapeutic and help with symptom reduction CSW is working in setting up further  outpatient ECT / maintenance ECT - as part of ongoing outpatient treatment after discharge   Makenah Karas, [place] 09/18/2015, 9:04 AM

## 2015-09-18 NOTE — [facility] Group Notes (Signed)

## 2015-09-18 NOTE — Progress Notes (Signed)
1:1  Patient continues to sit in dayroom watching TV and talking to peers.  Patient denied pain.  Respirations even and unlabored.  No signs/symptoms of pain/distress noted on patient's face/body movements.  Patient stated she will not hurt herself at this time, contracts for safety.  Denied HI.  Denied A/V hallucinations.  1:1 continues for safety per MD orders.

## 2015-09-18 NOTE — Evaluation (Signed)
Physical Therapy Evaluation Patient Details Name: Natasha [name] MRN: [id number] DOB: [birth year] Today's Date: 09/18/2015   History of Present Illness  Pt admitted with MDD and hx of quadriparesia (C-6 injury).  Pt reports typically mobilizes in wc at home but uses R lofstrum crutch for short distances out of house  Clinical Impression  Pt admitted as above and presenting at supervision level for mobility including ambulation x 160' with single [place] crutch.  Pt states she is pleased with her ability to mobilize and that she is approx at baseline level of function.  Will dc PT services at this time.    Follow Up Recommendations No PT follow up    Equipment Recommendations  None recommended by PT    Recommendations for Other Services       Precautions / Restrictions Precautions Precautions: Fall Restrictions Weight Bearing Restrictions: No      Mobility  Bed Mobility               General bed mobility comments: NT, pt states she is able to get self in/out of bed "her way"  Transfers Overall transfer level: Needs assistance Equipment used: None Transfers: Sit to/from Stand Sit to Stand: Supervision         General transfer comment: Pt unassisted sit<>stand and able to balance on standing  Ambulation/Gait Ambulation/Gait assistance: Min guard;Supervision Ambulation Distance (Feet): 160 Feet Assistive device: Lofstrands (Single crutch R UE) Gait Pattern/deviations: Step-through pattern;Decreased stance time - right;Decreased dorsiflexion - right;Shuffle;Trunk flexed Gait velocity: decreased   General Gait Details: Slow deliberate pace with pt compensating visually for proprioceptive deficits (I can't make this work if I don't look at my feet".  Pt with no balance loss.  Stairs            Wheelchair Mobility    Modified Rankin (Stroke Patients Only)       Balance                                             Pertinent  Vitals/Pain Pain Assessment: No/denies pain    Home Living Family/patient expects to be discharged to:: Private residence Living Arrangements: Parent Available Help at Discharge: Family Type of Home: House Home Access: Stairs to enter Entrance Stairs-Rails: Right Entrance Stairs-Number of Steps: 2 Home Layout: One level Home Equipment: Crutches;Wheelchair - manual      Prior Function Level of Independence: Independent with assistive device(s)               Hand Dominance        Extremity/Trunk Assessment   Upper Extremity Assessment: RUE deficits/detail;LUE deficits/detail RUE Deficits / Details: Ltd triceps 2* C-6 injury     LUE Deficits / Details: No triceps 2* C-6 injury   Lower Extremity Assessment: RLE deficits/detail;LLE deficits/detail RLE Deficits / Details: AAROM WFL with 4/5 strength at hip; 3+ knee ext and trace dorsiflex.  Increased Extensor tone noted. LLE Deficits / Details: Strength ~ 4/5 hip and knee and 3+ dorsiflex     Communication   Communication: No difficulties  Cognition Arousal/Alertness: Awake/alert Behavior During Therapy: WFL for tasks assessed/performed Overall Cognitive Status: Within Functional Limits for tasks assessed                      General Comments      Exercises  Assessment/Plan    PT Assessment Patent does not need any further PT services  PT Diagnosis Difficulty walking;Other (comment) Glendell Docker)   PT Problem List    PT Treatment Interventions     PT Goals (Current goals can be found in the Care Plan section) Acute Rehab PT Goals Patient Stated Goal: Resume previous lifestyle without lose of IND.  Pt happy with ability this date and states she is ~ at baseline level of mobility. PT Goal Formulation: All assessment and education complete, DC therapy    Frequency     Barriers to discharge        Co-evaluation               End of Session Equipment Utilized During Treatment:  Gait belt Activity Tolerance: Patient tolerated treatment well Patient left: in chair;with nursing/sitter in room Nurse Communication: Mobility status    Functional Assessment Tool Used: Clinical judgement Functional Limitation: Mobility: Walking and moving around Mobility: Walking and Moving Around Current Status (Y7829): At least 20 percent but less than 40 percent impaired, limited or restricted Mobility: Walking and Moving Around Goal Status [phone]): At least 20 percent but less than 40 percent impaired, limited or restricted Mobility: Walking and Moving Around Discharge Status [phone]): At least 20 percent but less than 40 percent impaired, limited or restricted    Time: 8469-6295 PT Time Calculation (min) (ACUTE ONLY): 16 min   Charges:   PT Evaluation $Initial PT Evaluation Tier I: 1 Procedure     PT G Codes:   PT G-Codes **NOT FOR INPATIENT CLASS** Functional Assessment Tool Used: Clinical judgement Functional Limitation: Mobility: Walking and moving around Mobility: Walking and Moving Around Current Status (M8413): At least 20 percent but less than 40 percent impaired, limited or restricted Mobility: Walking and Moving Around Goal Status [phone]): At least 20 percent but less than 40 percent impaired, limited or restricted Mobility: Walking and Moving Around Discharge Status [phone]): At least 20 percent but less than 40 percent impaired, limited or restricted    Lonzo Saulter 09/18/2015, 6:11 PM

## 2015-09-18 NOTE — Progress Notes (Signed)
D: Patient in the hallway on first approach.  Patient states she had a rough day.  Patient states she was upset today after a group and banged her head on her wheel chair.  Patient is now on a 1:1 for safety.  Patient states she is passive SI.  Patient denies HI and denies AVH.  Patient verbally contracts for safety.   A: Staff to monitor Q 15 mins for safety.  Encouragement and support offered.  Scheduled medications administered per orders. R: Patient remains safe on the unit.  Patient did not attend group tonight.  Patient visible on the unit.

## 2015-09-18 NOTE — Progress Notes (Signed)
CSW made outpatient ECT referral to [facility] Psychiatry Department for discharge planning purposes. CSW awaiting decision regarding referral.  Chad Cordial, [facility] Clinical Social Work [phone]

## 2015-09-18 NOTE — Tx Team (Signed)
Interdisciplinary Treatment Plan Update (Adult) Date: 09/18/2015   Date: 09/18/2015 1:28 PM  Progress in Treatment:  Attending groups: Yes  Participating in groups: Yes Taking medication as prescribed: Yes  Tolerating medication: Yes  Family/Significant othe contact made: Yes, with mother Patient understands diagnosis: Yes AEB seeking help for depression Discussing patient identified problems/goals with staff: Yes  Medical problems stabilized or resolved: Yes  Denies suicidal/homicidal ideation: No, Pt endorses passive SI Patient has not harmed self or Others: Yes   New problem(s) identified: None identified at this time.   Discharge Plan or Barriers: CSW will assess for appropriate discharge plan and relevant barriers.   09/16/15: Pt is considering outpatient ECT at [facility] and an [place] program.  09/18/15: Referral made to [place] program. Will follow-up outpatient at [facility] and with her PCP  Additional comments: n/a   Reason for Continuation of Hospitalization:  Anxiety Depression Medication stabilization Suicidal ideation  Estimated length of stay: 3-5 days  Review of initial/current patient goals per problem list:   1.  Goal(s): Patient will participate in aftercare plan  Met:  Progressing  Target date: 3-5 days from date of admission   As evidenced by: Patient will participate within aftercare plan AEB aftercare provider and housing plan at discharge being identified.  09/11/15: CSW to work with Pt to assess for appropriate discharge plan and faciliate appointments and referrals as needed prior to d/c. 09/16/15: CSW made referral to [place]; pt will return home and would like to follow-up with an IOP program. 09/18/15: Pt will discharge home; awaiting ECT decision  2.  Goal (s): Patient will exhibit decreased depressive symptoms and suicidal ideations.  Met:  No  Target date: 3-5 days from date of admission   As evidenced by:  Patient will utilize self rating of depression at 3 or below and demonstrate decreased signs of depression or be deemed stable for discharge by MD. 09/11/15: Pt was admitted with symptoms of depression, rating 10/10. Pt continues to present with flat affect and depressive symptoms.  Pt will demonstrate decreased symptoms of depression and rate depression at 3/10 or lower prior to discharge. 09/16/15: Pt rating depression at "11/10"; endorses passive SI 09/18/15: Pt rates depression at 7/10, endorses passive SI  3.  Goal(s): Patient will demonstrate decreased signs and symptoms of anxiety.  Met:  No  Target date: 3-5 days from date of admission   As evidenced by: Patient will utilize self rating of anxiety at 3 or below and demonstrated decreased signs of anxiety, or be deemed stable for discharge by MD 09/11/15: Pt was admitted with increased levels of anxiety and is currently rating those symptoms highly. Pt will demonstrated decreased symptoms of anxiety and rate it at 3/10 prior to d/c. 09/16/15: Pt rates anxiety at "11/10" and expresses that her anxiety is not improving. 09/18/15: Pt rates anxiety at 9/10 and presents with anxious affect.  Attendees:  Patient:    Family:    Physician: Dr. Parke Poisson, MD  09/18/2015 1:28 PM  Nursing: Lars Pinks, RN Case manager  09/18/2015 1:28 PM  Clinical Social Worker Peri Maris, Latanya Presser, MSW 09/18/2015 1:28 PM  Other: Lucinda Dell, Beverly Sessions Liasion 09/18/2015 1:28 PM  Clinical: Grayland Ormond, RN 09/18/2015 1:28 PM  Other: , RN Charge Nurse 09/18/2015 1:28 PM  Other:     Peri Maris, [place] MSW

## 2015-09-18 NOTE — Progress Notes (Signed)
Nursing 1:1 Note: D: Patient in the hallway to get medications tonight.  Patient states she wants to start a new day tomorrow.  Patient states she is embarrassed for her behavior during the day.  Patient denies SI/HI and denies AVH.   A: Staff to monitor Q 15 mins for safety.  Encouragement and support offered.  Scheduled medications administered per orders.  Patient remains on 1:1 for safety.   R: Patient remains safe on the unit.  Patient did not attend group tonight.  Patient visible on the unit   Patient taking administered medications.

## 2015-09-19 MED ORDER — BUPROPION HCL ER (XL) 300 MG PO TB24
450.0000 mg | ORAL_TABLET | Freq: Every day | ORAL | Status: DC
  Administered 2015-09-20 – 2015-09-25 (×6): 450 mg via ORAL
  Filled 2015-09-19 (×8): qty 1
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
## 2015-09-19 NOTE — Progress Notes (Signed)
Nursing 1:1 Note D: Patient resting in bed with eyes closed.  Respirations even and unlabored.  Patient appears to be in no apparent distress. A: Staff to monitor Q 15 mins for safety.   R:Patient remains safe on the unit.'   

## 2015-09-19 NOTE — Progress Notes (Signed)
See 1:1 notes on flowsheet at bedside

## 2015-09-19 NOTE — Progress Notes (Signed)
Pt stated that she is now ok with being on the 1:1. She ate about 30% of dinner today. She also stated that she was able to get some things off her chest today.

## 2015-09-19 NOTE — [facility] Group Notes (Signed)
[facility] LCSW Group Therapy 09/19/2015 1:15pm  Type of Therapy: Group Therapy- Feelings Around Relapse and Recovery  Participation Level: Active   Participation Quality:  Appropriate  Affect:  Appropriate  Cognitive: Alert and Oriented   Insight:  Developing   Engagement in Therapy: Developing/Improving and Engaged   Modes of Intervention: Clarification, Confrontation, Discussion, Education, Exploration, Limit-setting, Orientation, Problem-solving, Rapport Building, Dance movement psychotherapist, Socialization and Support  Summary of Progress/Problems: The topic for today was feelings about relapse. The group discussed what relapse prevention is to them and identified triggers that they are on the path to relapse. Members also processed their feeling towards relapse and were able to relate to common experiences. Group also discussed coping skills that can be used for relapse prevention.  Pt presented with slightly brighter affect in group today and was more involved in group discussion. Pt was less tearful today and was open to challenging and probing by CSW. Pt identified that anxiety and a feeling of failure are triggers for her relapse. She identified self-harm and similar urges as an early warning sign related to relapse of depression. Pt related with other peers and was receptive to feedback and encouragement. Pt continues to be self-deprecating.   Therapeutic Modalities:   Cognitive Behavioral Therapy Solution-Focused Therapy Assertiveness Training Relapse Prevention Therapy    Natasha [name] [phone] 09/19/2015 4:40 PM

## 2015-09-19 NOTE — [facility] Group Notes (Signed)
[facility] LCSW Aftercare Discharge Planning Group Note  09/19/2015 8:45 AM  Participation Quality: Alert, Appropriate and Oriented  Mood/Affect: Flat  Depression Rating: 10  Anxiety Rating: 10  Thoughts of Suicide: Pt endorses passive SI  Will you contract for safety? "I'm struggling to do that"  Current AVH: Pt denies  Plan for Discharge/Comments: Pt attended discharge planning group and actively participated in group. CSW discussed suicide prevention education with the group and encouraged them to discuss discharge planning and any relevant barriers. Pt expresses that she does not like the patient she is or the person she is right now. Pt describes dealing with "things I thought I had put away."  Transportation Means: Pt reports access to transportation  Supports: Mother  Chad Cordial, LCSWA 09/19/2015 9:20 AM

## 2015-09-19 NOTE — Progress Notes (Signed)
Nursing 1:1 note D: Patient lying in bed on her back.  Patient just used the restroom.  Patient did not need anything from staff at this time. A:  Patient remains on 1:1 for safety. R: Patient safe on the unit at this time.

## 2015-09-19 NOTE — Plan of Care (Signed)
Problem: Diagnosis: Increased Risk For Suicide Attempt Goal: STG-Patient Will Report Suicidal Feelings to Staff Outcome: Not Met (add Reason) Patient on 1:1 for self harming behaviors today.

## 2015-09-19 NOTE — Progress Notes (Signed)
Patient ID: Natasha [name], female   DOB: 05-Sep-1971, 44 y.o.   MRN: [id number] PER STATE REGULATIONS 482.30  THIS CHART WAS REVIEWED FOR MEDICAL NECESSITY WITH RESPECT TO THE PATIENT'S ADMISSION/ DURATION OF STAY.  NEXT REVIEW DATE: 09/23/2015  Willa Rough, RN, BSN CASE MANAGER

## 2015-09-19 NOTE — Progress Notes (Signed)
1:1 Note    D) Pt sits in the dayroom and is interacting with her 1:1 and with her peers. Has been attending the class. Upset that the 1:1 was not taken away and that she is still on it. Talked about eating and how she really doesn't have a desire for food and doesn't want to eat. Is willing to drink the Ensure A) given support and gentle confrontation about her not want to eat food.  R) Pt remains safe. Does not want to be on a 1:1. States that she will try to eat a little bit more at dinner time

## 2015-09-19 NOTE — Progress Notes (Signed)
1:1 Note  D: Pt approached this writer this morning and stated "I do not want to be a bad patient. I want to be a good Pt. I don't want to be hard to deal with. I think that I can contract for safety now. I still have the bad thoughts of wanting to hurt myself, but I think I can contract not to hurt myself. A) Provided Pt with a 1:1 and encouraged to continue to work on her issues with eating R) Continues to have thoughts on and off and is unable to contract.

## 2015-09-19 NOTE — Progress Notes (Signed)
1:1 Note  D) Pt has been in the dayroom interacting with her peers and interacting with her 1:1. Remains upset over the fact that she has a 1:1 still. States that she wants to get off of it tomorrow.  A) Confronted Pt again, gently, about her not eating and how that is showing [place] that she is slowly starving herself. That in order to be off the 1:1 and be ready to go home she must be able to show up she is serious and helping herself. R) Pt states she understands and will try to really eat at her dinner time.

## 2015-09-19 NOTE — Progress Notes (Signed)
Recreation Therapy Notes  Date: 10.07.2016 Time: 9:30am Location: [address] Group room   Group Topic: Stress Management  Goal Area(s) Addresses:  Patient will actively participate in stress management techniques presented during session.   Behavioral Response: Appropriate   Intervention: Stress management techniques  Activity :  Deep Breathing and Guided Imagery. LRT provided instruction and demonstration on practice of Guided Imagery. Technique was coupled with deep breathing.   Education:  Stress Management, Discharge Planning.   Education Outcome: Acknowledges education  Clinical Observations/Feedback: Patient actively engaged in technique introduced, expressed no concerns and demonstrated ability to practice independently post d/c.  Catheryn Slifer L Kenan Moodie, LRT/CTRS  Tracy Kinner L 09/19/2015 10:13 AM 

## 2015-09-20 NOTE — Progress Notes (Signed)
D) Pt has been taken off her 1:1. Contracts for safety. Pt states her depression is a 7, her hopelessness is at a 6 and her anxiety is at a 7. Continues to have thoughts of SI but states she wants to contract. Pt has asked to not have to go to the cafeteria for meals due to "it being so very hard for me to eat in front of others".  A) will try to work with Pt on this today. Given support, reassurance and praise. Provided with a 1:1. R) Pt states that she is feeling suicidal, but is contracting for her safety. I really want to get help this time.

## 2015-09-20 NOTE — Progress Notes (Signed)
.  Psychoeducational Group Note    Date: 09/20/2015 Time:  0930    Goal Setting Purpose of Group: To be able to set a goal that is measurable and that can be accomplished in one day Participation Level:  Active  Participation Quality:  Appropriate  Affect:  Appropriate  Cognitive:  Oriented  Insight:  Improving  Engagement in Group:  Engaged  Additional Comments:  Pt attended the group and participated.  Natasha [name] A 

## 2015-09-20 NOTE — Progress Notes (Signed)
D.  Pt pleasant on approach in dayroom watching movie with peers.  Pt positive for evening wrap up group and continues to contract for safety since being taken off the 1:1.  Pt denies HI/hallucinations at this time, does continue to endorse passive SI but contracts.  A.  Support and encouragement offered  R.  Pt remains safe on the unit, will continue to monitor.

## 2015-09-20 NOTE — Progress Notes (Signed)
Patient ID: Natasha [name], female   DOB: 1971/12/09, 44 y.o.   MRN: [id number] [facility] MD Progress Note  09/20/2015 4:26 PM Natasha [name]  MRN:  [id number] Subjective:    Patient has continued to report significant depression and anxiety, but states she is improving. She states she is in a continuous learning process. She continues to endorse high levels of depression, anxiety, and hopelessness 5-6/10, 8/10 and10-11/10 respectively. Statins I don't know if there is a life ou there for me, haven't decided yes or no yet.  States head banging was impulsive,and " I was just frustrated, wanted to thoughts to stop". Today, she is apologetic about above, stating " I don't want to be a difficult patient". Currently wanting to D/C from one to one because feels it is somewhat intrusive . Denies medication side effects today.  Objective : I have discussed case with treatment team and have met with patient. Patient visible on unit. Going to groups . As above, yesterday had brief episode of head banging against her wheel chair arm rests , resulting in soft tissue swelling- no LOC, no vomiting, no change in level of alertness .  Due to this she was placed on one to one observation for safety. Although she has ongoing severe depression and report of passive SI, decision has been made to D/C one to one for now, reevaluate further prn. Pt is able to identy triggers that caused her depression.  Today patient verbal, focused on her feeling " bad about having banged my head , I  Should not have done it , I know everyone is trying to help me ". She responds well to support, empathy. She does seem more insightful about other , better strategies she can access if acutely upset rather than self harm thoughts or actions. She states, for example, that she notes that when she is reading something , she can disconnect from her thoughts / stressors for " a little while ".  Patient encouraged to consider journaling, reading as a  strategy to cope with acute negative affects. Appetite remains fair, may be somewhat better on increased Zyprexa dose .  Ha gained  Weight on unit .  Of note, although reporting depression as 9/10, affect does appear more reactive today.  No akathisia or excessive sedation noted      Principal Problem: MDD (major depressive disorder), recurrent severe, without psychosis ([place]) Diagnosis:   Patient Active Problem List   Diagnosis Date Noted  . MDD (major depressive disorder), recurrent severe, without psychosis ([place]) [F33.2] 09/11/2015  . Major depressive disorder, recurrent, severe without psychotic features ([place]) [F33.2]   . Severe recurrent major depression without psychotic features ([place]) [F33.2] 06/19/2015   Total Time spent with patient: 25 minutes    Past Medical History:  Past Medical History  Diagnosis Date  . C6 spinal cord injury   . Depression     Past Surgical History  Procedure Laterality Date  . Back surgery     Family History:  Family History  Problem Relation Age of Onset  . Alcohol abuse Father   . Alcohol abuse Brother     Social History:  History  Alcohol Use  . Yes    Comment: One drink a week at most; rarely drinks     History  Drug Use No    Social History   Social History  . Marital Status: Single    Spouse Name: N/A  . Number of Children: N/A  . Years of  Education: N/A   Social History Main Topics  . Smoking status: Never Smoker   . Smokeless tobacco: Never Used     Comment: No smoking hx; no need for cessation materials  . Alcohol Use: Yes     Comment: One drink a week at most; rarely drinks  . Drug Use: No  . Sexual Activity: No   Other Topics Concern  . None   Social History Narrative   Additional Social History:    Pain Medications: Pt denies  Prescriptions: Zyprexa, Klonopin, Wellbutrin Over the Counter: Pt denies History of alcohol / drug use?: No history of alcohol / drug abuse Longest period of sobriety (when/how  long): NA  Sleep:  Fair   Appetite:   Poor today, drinks 3 Ensure a day.   Current Medications: Current Facility-Administered Medications  Medication Dose Route Frequency Provider Last Rate Last Dose  . acetaminophen (TYLENOL) tablet 650 mg  650 mg Oral Q6H PRN Benjamine Mola, FNP   650 mg at 09/19/15 1845  . alum & mag hydroxide-simeth (MAALOX/MYLANTA) 200-200-20 MG/5ML suspension 30 mL  30 mL Oral Q4H PRN Benjamine Mola, FNP      . buPROPion (WELLBUTRIN XL) 24 hr tablet 450 mg  450 mg Oral Daily [name], MD   450 mg at 09/20/15 0960  . clonazePAM (KLONOPIN) tablet 2 mg  2 mg Oral QHS Benjamine Mola, FNP   2 mg at 09/19/15 2235  . feeding supplement (ENSURE ENLIVE) (ENSURE ENLIVE) liquid 237 mL  237 mL Oral TID BM [name] Cobos, MD   237 mL at 09/20/15 1513  . hydrOXYzine (ATARAX/VISTARIL) tablet 50 mg  50 mg Oral TID PRN Benjamine Mola, FNP   50 mg at 09/20/15 1148  . lithium carbonate capsule 150 mg  150 mg Oral TID WC [name], MD   150 mg at 09/20/15 1147  . magnesium hydroxide (MILK OF MAGNESIA) suspension 30 mL  30 mL Oral Daily PRN Benjamine Mola, FNP      . methocarbamol (ROBAXIN) tablet 750 mg  750 mg Oral Q6H PRN Benjamine Mola, FNP      . OLANZapine (ZYPREXA) tablet 10 mg  10 mg Oral QHS [name], MD   10 mg at 09/19/15 2235  . SUMAtriptan (IMITREX) tablet 25 mg  25 mg Oral Q2H PRN Encarnacion Slates, NP   25 mg at 09/17/15 2304    Lab Results:  No results found for this or any previous visit (from the past 48 hour(s)).  Physical Findings: AIMS: Facial and Oral Movements Muscles of Facial Expression: None, normal Lips and Perioral Area: None, normal Jaw: None, normal Tongue: None, normal,Extremity Movements Upper (arms, wrists, hands, fingers): None, normal Lower (legs, knees, ankles, toes): None, normal, Trunk Movements Neck, shoulders, hips: None, normal, Overall Severity Severity of abnormal movements (highest score from questions above): None,  normal Incapacitation due to abnormal movements: None, normal Patient's awareness of abnormal movements (rate only patient's report): No Awareness, Dental Status Current problems with teeth and/or dentures?: No Does patient usually wear dentures?: No  CIWA:  CIWA-Ar Total: 1 COWS:  COWS Total Score: 3  Musculoskeletal: Strength & Muscle Tone: decreased Gait & Station: walks with cane, or mobilizes in wheelchair  Patient leans: N/A  Psychiatric Specialty Exam: ROS  denies shortness of breath or chest pain, reports  anorexia , no nausea, no vomiting   Blood pressure 128/82, pulse 108, temperature 98.4 F (36.9 C), temperature  source Oral, resp. rate 16, height 5' 1.5" (1.562 m), weight 58.514 kg (129 lb), last menstrual period 08/07/2015, SpO2 100 %.Body mass index is 23.98 kg/(m^2).  General Appearance: Well Groomed  Engineer, water::  Good  Speech:  Normal Rate  Volume:  Normal  Mood:    Reports ongoing , severe depression  Affect:     Less severely constricted in affect  Thought Process:  Linear  Orientation:  Full (Time, Place, and Person)  Thought Content:   No hallucinations, no delusions  Suicidal Thoughts:  Yes.  without intent/plan- chronic suicidal thoughts- at present time she denies plans or intentions of hurting self and is able to contract for safety on the unit at present , as noted, however, had self injurious episode where she banged head repeatedly yesterday  Homicidal Thoughts:  No  Memory:  recent and remote grossly intact   Judgement:  Fair  Insight:  Present  Psychomotor Activity:   Improving- has been more visible in day room, less isolated   Concentration:  Good  Recall:  Good  Fund of Knowledge:Good  Language: Good  Akathisia:  Negative  Handed:  Right  AIMS (if indicated):     Assets:  Communication Skills Resilience  ADL's:   Improved   Cognition: WNL  Sleep:  Number of Hours: 4.5  Assessment -  Patient reports ongoing depression and sadness.  Of Note, although reports father was an angry, verbally abusive person, particularly with her mother, she is not endorsing any clear history of childhood trauma or PTSD symptoms at this time. Today tends to present apologetic about this event and denying any ongoing plan or intention of hurting self , but staff concerned as continues to have passive SI and continues to endorse severe depression. She is tolerating medications well, and appetite may be increasing with current Zyprexa dose .    Treatment Plan Summary: Daily contact with patient to assess and evaluate symptoms and progress in treatment, Medication management, Plan inpatient treatment and medications as below  Increase   Wellbutrin XL  To 450  mgrs QDAY to address ongoing  Depression- I am less concerned about reported anorexia, as now slightly improved on Zyprexa and documented weight gain rather than loss since her inpatient admission Continue Zyprexa  10   mgrs QHS -  to address depression, anxiety, insomnia, mood disorder - side effects reviewed  Continue Klonopin 2 mgrs QHS to address insomnia, anxiety Continue  Lithium 150 mgrs TID as antidepressant augmentation and to help decrease long term suicidal risk. Continue Vistaril PRNs for severe anxiety if needed  As discussed with  Nursing Staff, treatment team, continue  One to one for safety at this time. Reevaluate whether still indicated on daily basis. Continue Ensure supplementation.  Encourage increased group participation to work on Radiographer, therapeutic and help with symptom reduction Consider   outpatient ECT / maintenance ECT - as part of ongoing outpatient treatment after discharge   Nanci Pina FNP-BC 09/20/2015, 4:26 PM

## 2015-09-20 NOTE — Progress Notes (Signed)
.  Psychoeducational Group Note    Date: 09/20/2015 Time:  0930    Goal Setting Purpose of Group: To be able to set a goal that is measurable and that can be accomplished in one day Participation Level:  Active  Participation Quality:  Appropriate  Affect:  Appropriate  Cognitive:  Oriented  Insight:  Improving  Engagement in Group:  Engaged  Additional Comments:  Pt attended the group and participated.  Tanee Henery A 

## 2015-09-20 NOTE — Progress Notes (Signed)
D) Pt has been attending the program and went to lie down for a little while this afternoon. Pt states that she is having some difficulty with her mother. 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 A) Given support, Provided with a 1:1 R) admits to thoughts of SI but continues to contact.

## 2015-09-20 NOTE — [facility] Group Notes (Signed)
[facility] Group Notes:  (Clinical Social Work)  09/20/2015     1:15-2:15PM  Summary of Progress/Problems:   The main focus of today's process group was to learn how to use a decisional balance exercise to move forward in the Stages of Change.  Patients listed needs on the whiteboard and unhealthy coping techniques often used to fill needs.  Motivational Interviewing and the whiteboard were utilized to help patients explore in depth the perceived benefits and costs of unhealthy coping techniques, as well as the  benefits and costs of replacing that with a healthy coping skills.   The patient expressed that she needs help with filtering her negative self-talk, which was discussed at some length.  We also talked about learning coping techniques to "live in the moment" and CSW initiated talk about mindfulness, gave examples and homework.  Type of Therapy:  Group Therapy - Process   Participation Level:  Active  Participation Quality:  Appropriate, Attentive, Sharing and Supportive  Affect:  Depressed and Flat  Cognitive:  Appropriate  Insight:  Engaged  Engagement in Therapy:  Engaged  Modes of Intervention:  Education, Motivational Interviewing  Ambrose Mantle, LCSW 09/20/2015, 4:02 PM

## 2015-09-20 NOTE — Progress Notes (Signed)
D) Pt has been attending the program and participating in the groups. Continues to state that she is having suicidal thoughts, but continues to contract. Affect is flat and mood depressed but is attending the program and is participating fully in the groups. Also is doing her workbook. States "I am trying hard. I don't want to be on a 1:1 anymore". A) Given support, reassurance and praise. R) Has stayed out in the day room interacting and attending the groups.

## 2015-09-20 NOTE — Progress Notes (Signed)
D) Pt getting ready to attend group therapy. Sat in the dayroom and ate a little food. States "I am trying to eat some, but it is hard because I don't have an appetite. Talked about her mother coming this evening to visit and how she is looking at this visit with some good thoughts and some bad thoughts. States she does not really get along well with her mother. A) given support, provided with a 1:1.  R) Affect is a little less intense this afternoon. Some spontaneous smiling with staff.

## 2015-09-20 NOTE — Progress Notes (Signed)
Pt stated that she is feeling good about being off the 1:1. One positive coping skill that she plans to use is to talk about what's going on with her to prevent her from coming back. What she hopes to gain from this hospitalization is to learn how to catch her depression before she starts to have self-harm thoughts.

## 2015-09-21 NOTE — Progress Notes (Signed)
Patient ID: Natasha [name], female   DOB: 1971-07-20, 44 y.o.   MRN: [id number] [facility] MD Progress Note  09/21/2015 1:40 PM LABELLA ZAHRADNIK  MRN:  [id number] Subjective:    Patient has continued to report significant depression and anxiety, not knowing what her purpose in life is. She states she cant help her mother and her mother cant help her. So Im going back to a place where I have no help or hope.  States I don't know if there is a life out there for me. She states she is being forced to eat, but I eat. Denies medication side effects today. Denies SI/HI/AVH.   Objective : I have discussed case with treatment team and have met with patient. Patient visible on unit. Going to groups. She responds well to support, empathy. She does seem more insightful about other , better strategies she can access if acutely upset rather than self harm thoughts or actions. She states, for example, that she notes that when she is reading something , she can disconnect from her thoughts / stressors for " a little while ".  Patient encouraged to consider journaling, reading as a strategy to cope with acute negative affects. Appetite remains fair, may be somewhat better on increased Zyprexa dose .  Ha gained  Weight on unit . Of note, although reporting depression as 9/10, affect does appear more reactive today.  No akathisia or excessive sedation noted      Principal Problem: MDD (major depressive disorder), recurrent severe, without psychosis ([place]) Diagnosis:   Patient Active Problem List   Diagnosis Date Noted  . MDD (major depressive disorder), recurrent severe, without psychosis ([place]) [F33.2] 09/11/2015  . Major depressive disorder, recurrent, severe without psychotic features ([place]) [F33.2]   . Severe recurrent major depression without psychotic features ([place]) [F33.2] 06/19/2015   Total Time spent with patient: 25 minutes    Past Medical History:  Past Medical History  Diagnosis Date  . C6 spinal cord  injury   . Depression     Past Surgical History  Procedure Laterality Date  . Back surgery     Family History:  Family History  Problem Relation Age of Onset  . Alcohol abuse Father   . Alcohol abuse Brother     Social History:  History  Alcohol Use  . Yes    Comment: One drink a week at most; rarely drinks     History  Drug Use No    Social History   Social History  . Marital Status: Single    Spouse Name: N/A  . Number of Children: N/A  . Years of Education: N/A   Social History Main Topics  . Smoking status: Never Smoker   . Smokeless tobacco: Never Used     Comment: No smoking hx; no need for cessation materials  . Alcohol Use: Yes     Comment: One drink a week at most; rarely drinks  . Drug Use: No  . Sexual Activity: No   Other Topics Concern  . None   Social History Narrative   Additional Social History:    Pain Medications: Pt denies  Prescriptions: Zyprexa, Klonopin, Wellbutrin Over the Counter: Pt denies History of alcohol / drug use?: No history of alcohol / drug abuse Longest period of sobriety (when/how long): NA  Sleep:  Fair   Appetite:   Poor today, drinks 3 Ensure a day.   Current Medications: Current Facility-Administered Medications  Medication Dose Route Frequency Provider Last Rate  Last Dose  . acetaminophen (TYLENOL) tablet 650 mg  650 mg Oral Q6H PRN Benjamine Mola, FNP   650 mg at 09/21/15 8341  . alum & mag hydroxide-simeth (MAALOX/MYLANTA) 200-200-20 MG/5ML suspension 30 mL  30 mL Oral Q4H PRN Benjamine Mola, FNP      . buPROPion (WELLBUTRIN XL) 24 hr tablet 450 mg  450 mg Oral Daily [name], MD   450 mg at 09/21/15 0750  . clonazePAM (KLONOPIN) tablet 2 mg  2 mg Oral QHS Benjamine Mola, FNP   2 mg at 09/20/15 2230  . feeding supplement (ENSURE ENLIVE) (ENSURE ENLIVE) liquid 237 mL  237 mL Oral TID BM [name] Cobos, MD   237 mL at 09/21/15 0752  . hydrOXYzine (ATARAX/VISTARIL) tablet 50 mg  50 mg Oral TID PRN Benjamine Mola, FNP   50 mg at 09/20/15 1815  . lithium carbonate capsule 150 mg  150 mg Oral TID WC [name], MD   150 mg at 09/21/15 1310  . magnesium hydroxide (MILK OF MAGNESIA) suspension 30 mL  30 mL Oral Daily PRN Benjamine Mola, FNP      . methocarbamol (ROBAXIN) tablet 750 mg  750 mg Oral Q6H PRN Benjamine Mola, FNP      . OLANZapine (ZYPREXA) tablet 10 mg  10 mg Oral QHS [name], MD   10 mg at 09/20/15 2230  . SUMAtriptan (IMITREX) tablet 25 mg  25 mg Oral Q2H PRN Encarnacion Slates, NP   25 mg at 09/17/15 2304    Lab Results:  No results found for this or any previous visit (from the past 48 hour(s)).  Physical Findings: AIMS: Facial and Oral Movements Muscles of Facial Expression: None, normal Lips and Perioral Area: None, normal Jaw: None, normal Tongue: None, normal,Extremity Movements Upper (arms, wrists, hands, fingers): None, normal Lower (legs, knees, ankles, toes): None, normal, Trunk Movements Neck, shoulders, hips: None, normal, Overall Severity Severity of abnormal movements (highest score from questions above): None, normal Incapacitation due to abnormal movements: None, normal Patient's awareness of abnormal movements (rate only patient's report): No Awareness, Dental Status Current problems with teeth and/or dentures?: No Does patient usually wear dentures?: No  CIWA:  CIWA-Ar Total: 1 COWS:  COWS Total Score: 3  Musculoskeletal: Strength & Muscle Tone: decreased Gait & Station: walks with cane, or mobilizes in wheelchair  Patient leans: N/A  Psychiatric Specialty Exam: ROS  denies shortness of breath or chest pain, reports  anorexia , no nausea, no vomiting   Blood pressure 110/74, pulse 109, temperature 98.1 F (36.7 C), temperature source Oral, resp. rate 16, height 5' 1.5" (1.562 m), weight 58.514 kg (129 lb), last menstrual period 08/07/2015, SpO2 100 %.Body mass index is 23.98 kg/(m^2).  General Appearance: Well Groomed  Engineer, water::  Good   Speech:  Normal Rate  Volume:  Normal  Mood:    Reports ongoing , severe depression  Affect:     Less severely constricted in affect  Thought Process:  Linear  Orientation:  Full (Time, Place, and Person)  Thought Content:   No hallucinations, no delusions  Suicidal Thoughts:  Yes.  without intent/plan- chronic suicidal thoughts- at present time she denies plans or intentions of hurting self and is able to contract for safety on the unit at present .  Homicidal Thoughts:  No  Memory:  recent and remote grossly intact   Judgement:  Fair  Insight:  Present  Psychomotor  Activity:   Improving- has been more visible in day room, less isolated   Concentration:  Good  Recall:  Good  Fund of Knowledge:Good  Language: Good  Akathisia:  Negative  Handed:  Right  AIMS (if indicated):     Assets:  Communication Skills Resilience  ADL's:   Improved   Cognition: WNL  Sleep:  Number of Hours: 4  Assessment -  Patient reports ongoing depression and sadness. Of Note, although reports father was an angry, verbally abusive person, particularly with her mother, she is not endorsing any clear history of childhood trauma or PTSD symptoms at this time. Today tends to present apologetic about this event and denying any ongoing plan or intention of hurting self , but staff concerned as continues to have passive SI and continues to endorse severe depression. She is tolerating medications well, and appetite may be increasing with current Zyprexa dose .    Treatment Plan Summary: Daily contact with patient to assess and evaluate symptoms and progress in treatment, Medication management, Plan inpatient treatment and medications as below  Increase   Wellbutrin XL  To 450  mgrs QDAY to address ongoing  Depression- I am less concerned about reported anorexia, as now slightly improved on Zyprexa and documented weight gain rather than loss since her inpatient admission Continue Zyprexa  10   mgrs QHS -  to address  depression, anxiety, insomnia, mood disorder - side effects reviewed  Continue Klonopin 2 mgrs QHS to address insomnia, anxiety Continue  Lithium 150 mgrs TID as antidepressant augmentation and to help decrease long term suicidal risk. Continue Vistaril PRNs for severe anxiety if needed  As discussed with  Nursing Staff, treatment team, continue  One to one for safety at this time. Reevaluate whether still indicated on daily basis. Continue Ensure supplementation.  Encourage increased group participation to work on Radiographer, therapeutic and help with symptom reduction Consider   outpatient ECT / maintenance ECT - as part of ongoing outpatient treatment after discharge   Nanci Pina FNP-BC 09/21/2015, 1:40 PM

## 2015-09-21 NOTE — Progress Notes (Signed)
D) Pt has attended the groups and interacts with her peers. Affect is flat and mood depressed. Pt rates her depression at a 7 hopelessness at an 8 and her anxiety at a 9. Pt continues to have thoughts of Suicide, but contracts for her safety on the unit. States that she is struggling with the fact that she is having difficulty with her mother. Becomes verbally abusive when her mother falls down. 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 Pt is tearful and sad. Doesn't understand why she responds the way she does to her mother.  A) Provided Pt with a 1:1. Given support, reassurance and praise.  Encouraged Pt to think about the reasons she might be feeling as she does about her mother. Verbal contract obtained from Pt for her safety. R) Pt states that one of the reasons she is feeling as she does is related to fear. Fear of what is going to happen to both her mother and herself.

## 2015-09-21 NOTE — Plan of Care (Signed)
Problem: Diagnosis: Increased Risk For Suicide Attempt Goal: STG-Patient Will Attend All Groups On The Unit Outcome: Progressing Pt did not attend evening wrap up group

## 2015-09-21 NOTE — Progress Notes (Signed)
Patient ID: Natasha [name], female   DOB: [birth year], 44 y.o.   MRN: [id number] D: Patient in hallway visiting with her mother. Pt reports visit went well. Pt mood and affect appeared sad but appropriate to situation. Pt reports she is tolerating medication well. Pt denies SI/HI/AVH and pain. Cooperative with assessment. No physical distress.   A: Met with pt 1:1. Medications administered as prescribed. Support and encouragement provided to attend groups and engage in milieu. Pt encouraged to discuss feelings and come to staff with any question or concerns.   R: Patient remains safe and complaint with medications.

## 2015-09-21 NOTE — Progress Notes (Signed)
Psychoeducational Group Note  Psychoeducational Group Note  Date: 09/21/2015 Time:  0930 Group Topic/Focus:  Gratefulness:  The focus of this group is to help patients identify what two things they are most grateful for in their lives. What helps ground them and to center them on their work to their recovery.  Participation Level:  Active  Participation Quality:  Appropriate  Affect:  Appropriate  Cognitive:  Oriented  Insight:  Improving  Engagement in Group:  Engaged  Additional Comments:  Pt attended and participated.  Dione Housekeeper

## 2015-09-21 NOTE — Progress Notes (Signed)
Psychoeducational Group Note  Date:  09/21/2015 Time:  2100  Group Topic/Focus:  wrap up group  Participation Level: Did Not Attend  Participation Quality:  Not Applicable  Affect:  Not Applicable  Cognitive:  Not Applicable  Insight:  Not Applicable  Engagement in Group: Not Applicable  Additional Comments:  Pt was notified that group was beginning but returned to her room reporting wanting quiet time after her visitor left. Pt had her mother come to visit for about 30 min and pt reported visit going well and not stressful.   Shelah Lewandowsky 09/21/2015, 10:54 PM

## 2015-09-21 NOTE — [facility] Group Notes (Signed)
[facility] Group Notes:  (Nursing/MHT/Case Management/Adjunct)  Date:  09/21/2015  Time:  1045 Type of Therapy:  Nurse Education  / life Skills : The focus of the group is on teaching pts the importance of healthy support systems in their lives. Participation Level:  Active  Participation Quality:  Attentive  Affect:  Anxious  Cognitive:  Oriented  Insight:  Limited  Engagement in Group:  Lacking  Modes of Intervention:  Discussion  Summary of Progress/Problems:  Natasha [name] 09/21/2015, 6:36 PM

## 2015-09-21 NOTE — [facility] Group Notes (Signed)
[facility] Group Notes:  (Clinical Social Work)  09/21/2015  1:15-2:15PM  Summary of Progress/Problems:   The main focus of today's process group was to   1)  discuss the importance of adding supports  2)  define health supports versus unhealthy supports  3)  identify the patient's current unhealthy supports and plan how to handle them  4)  Identify the patient's current healthy supports and plan what to add.  An emphasis was placed on using counselor, doctor, therapy groups, 12-step groups, and problem-specific support groups to expand supports.    The patient expressed full comprehension of the concepts presented, and agreed that there is a need to add more supports.  The patient stated she wants to start taking her mother with her to doctor's appointments.  Type of Therapy:  Process Group with Motivational Interviewing  Participation Level:  Active  Participation Quality:  Appropriate, Attentive and Sharing  Affect:  Depressed and Flat  Cognitive:  Confused  Insight:  Engaged  Engagement in Therapy:  Engaged  Modes of Intervention:   Education, Support and Processing, Activity  Ambrose Mantle, LCSW 09/21/2015

## 2015-09-22 LAB — GLUCOSE, CAPILLARY: Glucose-Capillary: 101 mg/dL — ABNORMAL HIGH (ref 65–99)

## 2015-09-22 NOTE — Progress Notes (Signed)
Patient ID: Natasha [name], female   DOB: 11-15-1971, 44 y.o.   MRN: [id number] D: Patient reports she had an altercation with mother over finances. Pt stated she called her back to apologize. Pt reports possible discharge this week and is worried about taking care of herself and her helping her mother. Pt mood and affect appeared sad but appropriate to situation. Pt reports she is tolerating medication well. Pt endorses passive SI without a plan and contract to come to staff. denies HI/AVH and pain. Cooperative with assessment.  A: Met with pt 1:1. Medications administered as prescribed. Support and encouragement provided. Pt encouraged to discuss feelings and come to staff with any question or concerns.  R: Patient remains safe and complaint with medications.

## 2015-09-22 NOTE — Progress Notes (Signed)
Adult Psychoeducational Group Note  Date:  09/22/2015 Time:  8:25 PM  Group Topic/Focus:  Wrap-Up Group:   The focus of this group is to help patients review their daily goal of treatment and discuss progress on daily workbooks.  Participation Level:  Active  Participation Quality:  Appropriate  Affect:  Appropriate  Cognitive:  Appropriate  Insight: Good  Engagement in Group:  Engaged  Modes of Intervention:  Discussion  Additional Comments:  Pt rated overall day a 7 out of 10 because she was able to rate her depression higher. Pt reported that she is feeling better, which she stated "is a great feeling because I forgot what if feels like to feel better". Pt noted that being able to get around better along with her feeling better was a positive part of her day. Pt reported that her goal for the day was to speak with Dr. Jama Flavors "without freaking out" about discharge, which she feels that she achieved.   Natasha [name] 09/22/2015, 8:50 PM

## 2015-09-22 NOTE — Progress Notes (Signed)
Patient ID: Natasha [name], female   DOB: 10-03-1971, 44 y.o.   MRN: [id number] [facility] MD Progress Note  09/22/2015 5:42 PM Natasha [name]  MRN:  [id number] Subjective:  Patient reports that she is less depressed and states " the medications I am on now seem to be a good combination for my depression". In spite of improved depressive symptoms , however, she continues to report high degree of hopelessness, poor self esteem, guilty ruminations, particularly with regards to her desire to have a more independent life, versus dedicating herself to the care of her mother . States she has been feeling more anxious recently after mother fell and " now has a bruise on her face ".  Denies medication side effects.  Objective : I have discussed case with treatment team and have met with patient. Patient visible on unit, active in milieu and groups, and interactive with peers , staff, although often keeps to self. Now off one to one observation, which she had been put on due to head banging  Episode ( with resulting ecchymoses on forehead.) No further self injurious behaviors and  At this time patient denies thoughts of hurting self although reports ongoing chronic suicidal thoughts, which have tended to decrease in frequency and have become more passive . Denies medication side effects . Appetite is fair , but she is eating some of her food/ensure supplements . She is anxious about potential discharge, but agreeing that she feels " more ready to work on it now ". We have discussed  Outpatient treatment options , and also have reviewed ways of decreasing social isolation and lack of daily structure, such as volunteering, or going to vocational rehab .    Principal Problem: MDD (major depressive disorder), recurrent severe, without psychosis ([place]) Diagnosis:   Patient Active Problem List   Diagnosis Date Noted  . MDD (major depressive disorder), recurrent severe, without psychosis ([place]) [F33.2] 09/11/2015  .  Major depressive disorder, recurrent, severe without psychotic features ([place]) [F33.2]   . Severe recurrent major depression without psychotic features ([place]) [F33.2] 06/19/2015   Total Time spent with patient: 25 minutes    Past Medical History:  Past Medical History  Diagnosis Date  . C6 spinal cord injury   . Depression     Past Surgical History  Procedure Laterality Date  . Back surgery     Family History:  Family History  Problem Relation Age of Onset  . Alcohol abuse Father   . Alcohol abuse Brother     Social History:  History  Alcohol Use  . Yes    Comment: One drink a week at most; rarely drinks     History  Drug Use No    Social History   Social History  . Marital Status: Single    Spouse Name: N/A  . Number of Children: N/A  . Years of Education: N/A   Social History Main Topics  . Smoking status: Never Smoker   . Smokeless tobacco: Never Used     Comment: No smoking hx; no need for cessation materials  . Alcohol Use: Yes     Comment: One drink a week at most; rarely drinks  . Drug Use: No  . Sexual Activity: No   Other Topics Concern  . None   Social History Narrative   Additional Social History:    Pain Medications: Pt denies  Prescriptions: Zyprexa, Klonopin, Wellbutrin Over the Counter: Pt denies History of alcohol / drug use?: No history of  alcohol / drug abuse Longest period of sobriety (when/how long): NA  Sleep:  Fair   Appetite:   Poor today, drinks 3 Ensure a day.   Current Medications: Current Facility-Administered Medications  Medication Dose Route Frequency Provider Last Rate Last Dose  . acetaminophen (TYLENOL) tablet 650 mg  650 mg Oral Q6H PRN Benjamine Mola, FNP   650 mg at 09/21/15 2924  . alum & mag hydroxide-simeth (MAALOX/MYLANTA) 200-200-20 MG/5ML suspension 30 mL  30 mL Oral Q4H PRN Benjamine Mola, FNP      . buPROPion (WELLBUTRIN XL) 24 hr tablet 450 mg  450 mg Oral Daily [name], MD   450 mg at 09/22/15  0755  . clonazePAM (KLONOPIN) tablet 2 mg  2 mg Oral QHS Benjamine Mola, FNP   2 mg at 09/21/15 2239  . feeding supplement (ENSURE ENLIVE) (ENSURE ENLIVE) liquid 237 mL  237 mL Oral TID BM [name] Cobos, MD   237 mL at 09/22/15 1454  . hydrOXYzine (ATARAX/VISTARIL) tablet 50 mg  50 mg Oral TID PRN Benjamine Mola, FNP   50 mg at 09/22/15 1046  . lithium carbonate capsule 150 mg  150 mg Oral TID WC [name], MD   150 mg at 09/22/15 1637  . magnesium hydroxide (MILK OF MAGNESIA) suspension 30 mL  30 mL Oral Daily PRN Benjamine Mola, FNP      . methocarbamol (ROBAXIN) tablet 750 mg  750 mg Oral Q6H PRN Benjamine Mola, FNP      . OLANZapine (ZYPREXA) tablet 10 mg  10 mg Oral QHS [name], MD   10 mg at 09/21/15 2240  . SUMAtriptan (IMITREX) tablet 25 mg  25 mg Oral Q2H PRN Encarnacion Slates, NP   25 mg at 09/17/15 2304    Lab Results:  Results for orders placed or performed during the hospital encounter of 09/11/15 (from the past 48 hour(s))  Glucose, capillary     Status: Abnormal   Collection Time: 09/21/15  2:57 PM  Result Value Ref Range   Glucose-Capillary 101 (H) 65 - 99 mg/dL    Physical Findings: AIMS: Facial and Oral Movements Muscles of Facial Expression: None, normal Lips and Perioral Area: None, normal Jaw: None, normal Tongue: None, normal,Extremity Movements Upper (arms, wrists, hands, fingers): None, normal Lower (legs, knees, ankles, toes): None, normal, Trunk Movements Neck, shoulders, hips: None, normal, Overall Severity Severity of abnormal movements (highest score from questions above): None, normal Incapacitation due to abnormal movements: None, normal Patient's awareness of abnormal movements (rate only patient's report): No Awareness, Dental Status Current problems with teeth and/or dentures?: No Does patient usually wear dentures?: No  CIWA:  CIWA-Ar Total: 1 COWS:  COWS Total Score: 3  Musculoskeletal: Strength & Muscle Tone: decreased Gait &  Station: walks with cane, or mobilizes in wheelchair  Patient leans: N/A  Psychiatric Specialty Exam: ROS denies shortness of breath or chest pain, reports  anorexia , no nausea, no vomiting   Blood pressure 114/79, pulse 88, temperature 97.6 F (36.4 C), temperature source Oral, resp. rate 20, height 5' 1.5" (1.562 m), weight 130 lb (58.968 kg), last menstrual period 08/07/2015, SpO2 100 %.Body mass index is 24.17 kg/(m^2).  General Appearance: Well Groomed  Engineer, water::  Good  Speech:  Normal Rate  Volume:  Normal  Mood:    Still depressed but states she is feeling better and that current medication regimen is effective   Affect:  Less constricted, still anxious   Thought Process:  Linear  Orientation:  Full (Time, Place, and Person)  Thought Content:   No hallucinations, no delusions  Suicidal Thoughts:  Yes.  without intent/plan- chronic- currently passive - suicidal thoughts- at present time she denies plans or intentions of hurting self and is able to contract for safety on the unit at present .  Homicidal Thoughts:  No  Memory:  recent and remote grossly intact   Judgement:  Fair  Insight:  Present  Psychomotor Activity:   Improving- has been more visible in day room, less isolated   Concentration:  Good  Recall:  Good  Fund of Knowledge:Good  Language: Good  Akathisia:  Negative  Handed:  Right  AIMS (if indicated):     Assets:  Communication Skills Resilience  ADL's:   Improved   Cognition: WNL  Sleep:  Number of Hours: 4.75  Assessment -  Patient reports  Overall improvement of mood and states she is less depressed, although still struggling with chronic passive SI, feelings of guilt and also anxiety symptoms. She is tolerating medications well, and anorexia seems to be less significant at this time. She is starting to be able to discuss disposition planning without as much anxiety, sense of dread as she had expressed earlier.    Treatment Plan Summary: Daily  contact with patient to assess and evaluate symptoms and progress in treatment, Medication management, Plan inpatient treatment and medications as below  Continue    Wellbutrin XL  Now at  450  mgrs QDAY to address ongoing  Depression Continue Zyprexa  10   mgrs QHS -  to address depression, anxiety, insomnia, mood disorder  Continue Klonopin 2 mgrs QHS to address insomnia, anxiety Continue  Lithium 150 mgrs TID as antidepressant augmentation and to help decrease long term suicidal risk. Continue Vistaril PRNs for severe anxiety if needed  Obtain Lithium serum level in AM Continue Ensure supplementation.  Encourage ongoing group participation to work on Radiographer, therapeutic and help with symptom reduction Consider   outpatient ECT / maintenance ECT - as part of ongoing outpatient treatment after discharge   COBOS, Felicita Gage  MD 09/22/2015, 5:42 PM

## 2015-09-22 NOTE — [facility] Group Notes (Signed)
[facility] LCSW Group Therapy 09/22/2015  1:15 pm  Type of Therapy: Group Therapy Participation Level: Active  Participation Quality: Attentive, Sharing and Supportive  Affect: Appropriate  Cognitive: Alert and Oriented  Insight: Developing/Improving and Engaged  Engagement in Therapy: Developing/Improving and Engaged  Modes of Intervention: Clarification, Confrontation, Discussion, Education, Exploration,  Limit-setting, Orientation, Problem-solving, Rapport Building, Dance movement psychotherapist, Socialization and Support  Summary of Progress/Problems: Pt identified obstacles faced currently and processed barriers involved in overcoming these obstacles. Pt identified steps necessary for overcoming these obstacles and explored motivation (internal and external) for facing these difficulties head on. Pt further identified one area of concern in their lives and chose a goal to focus on for today. Patient identified her mother's illness and her role as a caregiver as an obstacle. CSW and other group members provided patient with emotional support and encouragement.  Samuella Bruin, MSW, [employer] Clinical Social Worker [facility] [phone]

## 2015-09-22 NOTE — [facility] Group Notes (Signed)
   [facility] LCSW Aftercare Discharge Planning Group Note  09/22/2015  8:45 AM   Participation Quality: Alert, Appropriate and Oriented  Mood/Affect: Depressed and Flat  Depression Rating: 3  Anxiety Rating: 10  Thoughts of Suicide: Pt reports chronic SI but contracts for safety  Will you contract for safety? Yes  Current AVH: Pt denies  Plan for Discharge/Comments: Pt attended discharge planning group and actively participated in group. CSW provided pt with today's workbook. Patient hopes to return home with outpatient E[facility] at [facility] and [facility] at [facility]. Referrals pending.  Transportation Means: Pt reports access to transportation  Supports: No supports mentioned at this time  Samuella Bruin, MSW, [employer] Clinical Social Worker [employer] [phone]

## 2015-09-22 NOTE — Plan of Care (Signed)
Problem: Consults Goal: Depression Patient Education See Patient Education Module for education specifics.  Outcome: Progressing Nurse discussed depression/coping skills with patient.        

## 2015-09-22 NOTE — Progress Notes (Addendum)
D:  Patient's self inventory sheet, patient slept good last night, no sleep medication given.  Poor appetite, low energy level, poor concentration.  Rated depression 9, hopeless 10, high anxiety.  Denied withdrawals.  SI, contracts for safety.  No physical problems today.  Feels very anxious today, concerned about discharge.  A:  Medications administered per MD orders.   Emotional support and encouargement given patient. R:  Denied HI.  Denied A/V hallucinations.  SI, no plan, contracts for safety.  Safety maintained with 15 minute checks. Patient denied SI first thing this morning to RN, contracts for safety, then admitted SI.   Patient talked with RN this morning.  "If I go home this week, I don't know how I'm going to keep my focus, what difference does it matter.  I don't really care at all, feel numb about mom.  No feeling about her concern, love, compassion.  I don't really care.  I don't have any sense of self.  I don't think my presence really matter.  It doesn't change the way I feel.  I feel I could be hit by a train and it wouldn't matter.  Where are my feelings?  Depression is a lot better.  Hopelessness off chart.  I can't see a life for me at all.  We have a lot of debt.  Can't take on any more debt.  Brother was eager in the past to help, but cannot help now.  Brother lives in [place] and able to help [place].  Would like to smack him now.   Brother is an alcoholic, goes to AA, does not drink now, is self righteous."

## 2015-09-23 LAB — LITHIUM LEVEL: Lithium Lvl: 0.8 mmol/L (ref 0.60–1.20)

## 2015-09-23 NOTE — Progress Notes (Signed)
Adult Psychoeducational Group Note  Date:  09/23/2015 Time:  8:45 PM  Group Topic/Focus:  Wrap-Up Group:   The focus of this group is to help patients review their daily goal of treatment and discuss progress on daily workbooks.  Participation Level:  Active  Participation Quality:  Appropriate and Attentive  Affect:  Appropriate  Cognitive:  Appropriate  Insight: Good  Engagement in Group:  Engaged  Modes of Intervention:  Discussion  Additional Comments:  Pt rated overall day an 8 out of 10 because she says that she can "really feel her depression lifting". Pt reported that something positive that happened today was that she ate dinner. Pt reported that her goal for the day was to continue to feel good, which she feels that she achieved.   Natasha [name] 09/23/2015, 9:48 PM

## 2015-09-23 NOTE — Progress Notes (Signed)
Patient ID: Natasha [name], female   DOB: [birth year], 44 y.o.   MRN: [id number] D: Patient reports staying positive about discharge this week. Reports she is going on a retreat with mother after discharge. Currently denies SI/HI/AVH and pain. Pt visible on the unit reading and interacting with peers. Pt reports she is tolerating medication well. Cooperative with assessment.  A: Met with pt 1:1. Medications administered as prescribed. Support and encouragement provided. Pt encouraged to discuss feelings and come to staff with any question or concerns.  R: Patient remains safe and complaint with medications.

## 2015-09-23 NOTE — Plan of Care (Signed)
Problem: Diagnosis: Increased Risk For Suicide Attempt Goal: STG-Patient Will Attend All Groups On The Unit Outcome: Progressing Pt attended evening wrap up group     

## 2015-09-23 NOTE — Progress Notes (Signed)
Recreation Therapy Notes  Animal-Assisted Activity (AAA) Program Checklist/Progress Notes Patient Eligibility Criteria Checklist & Daily Group note for Rec Tx Intervention  Date: 10.11.2016 Time: 2:45pm Location: [address] [employer]    AAA/T Program Assumption of Risk Form signed by Patient/ or Parent Legal Guardian yes  Patient is free of allergies or sever asthma yes  Patient reports no fear of animals yes  Patient reports no history of cruelty to animals yes  Patient understands his/her participation is voluntary yes  Patient washes hands before animal contact yes  Patient washes hands after animal contact yes  Behavioral Response: Appropriate   Education: Hand Washing, Appropriate Animal Interaction   Education Outcome: Acknowledges education.   Clinical Observations/Feedback: Patient engaged appropriately in session, petting therapy appropriately and interacting with peers appropriately.   Natasha [name], Natasha [name]        Natasha [name] 09/23/2015 3:13 PM

## 2015-09-23 NOTE — Progress Notes (Signed)
Patient ID: Natasha [name], female   DOB: 05/22/1971, 44 y.o.   MRN: [id number] [facility] MD Progress Note  09/23/2015 5:35 PM MARCELE KOSTA  MRN:  [id number] Subjective:   Patient continues to report overall improved mood and less severe depression, but reports lingering suicidal ideations, often after " thinking about my situation", or more recently following a disagreement with her mother . States , however, that the intensity of her suicidal ideations has decreased and that she is better able to " think about other things ". She states these thoughts are currently more passive thoughts of dying /death, rather than any actual plan or intention of hurting self . She continues to state that her current medication regimen has been effective and that " I definitely feel better on them". Denies side effects- less focused on anorexia, appetite suppression, and accepting Ensure .  As she improves and we start reviewing discharge plans / options, she does report increased anxiety, " feeling a little panicky " at times .   Objective : I have discussed case with treatment team and have met with patient. Patient  Now visible on unit, active in milieu and groups. Improvement is partial but noticeable- she admits to feeling better, and less depressed . Anxiety is ongoing, but she seems less anxious today. Anxiety has increased as she starts discussing discharge planning. She seems less anhedonic and less constricted in affect, smiles and even laughs appropriately at times. She has been noted to be more conversant with staff and peers. She is looking forward to a horse show this weekend. She also seems somewhat more hopeful about her future, and today spoke about possibly becoming a horse show judge .  No further self injurious episodes on unit . Has been able to contract for safety on unit at this time. Tolerating medications well .  Responsive to support, encouragement , review of coping skills . Lithium level  0.6     Principal Problem: MDD (major depressive disorder), recurrent severe, without psychosis ([place]) Diagnosis:   Patient Active Problem List   Diagnosis Date Noted  . MDD (major depressive disorder), recurrent severe, without psychosis ([place]) [F33.2] 09/11/2015  . Major depressive disorder, recurrent, severe without psychotic features ([place]) [F33.2]   . Severe recurrent major depression without psychotic features ([place]) [F33.2] 06/19/2015   Total Time spent with patient: 25 minutes    Past Medical History:  Past Medical History  Diagnosis Date  . C6 spinal cord injury   . Depression     Past Surgical History  Procedure Laterality Date  . Back surgery     Family History:  Family History  Problem Relation Age of Onset  . Alcohol abuse Father   . Alcohol abuse Brother     Social History:  History  Alcohol Use  . Yes    Comment: One drink a week at most; rarely drinks     History  Drug Use No    Social History   Social History  . Marital Status: Single    Spouse Name: N/A  . Number of Children: N/A  . Years of Education: N/A   Social History Main Topics  . Smoking status: Never Smoker   . Smokeless tobacco: Never Used     Comment: No smoking hx; no need for cessation materials  . Alcohol Use: Yes     Comment: One drink a week at most; rarely drinks  . Drug Use: No  . Sexual Activity: No   Other  Topics Concern  . None   Social History Narrative   Additional Social History:    Pain Medications: Pt denies  Prescriptions: Zyprexa, Klonopin, Wellbutrin Over the Counter: Pt denies History of alcohol / drug use?: No history of alcohol / drug abuse Longest period of sobriety (when/how long): NA  Sleep:  Fair   Appetite:   Poor today, drinks 3 Ensure a day.   Current Medications: Current Facility-Administered Medications  Medication Dose Route Frequency Provider Last Rate Last Dose  . acetaminophen (TYLENOL) tablet 650 mg  650 mg Oral Q6H PRN Benjamine Mola, FNP   650 mg at 09/21/15 9604  . alum & mag hydroxide-simeth (MAALOX/MYLANTA) 200-200-20 MG/5ML suspension 30 mL  30 mL Oral Q4H PRN Benjamine Mola, FNP      . buPROPion (WELLBUTRIN XL) 24 hr tablet 450 mg  450 mg Oral Daily [name], MD   450 mg at 09/23/15 0829  . clonazePAM (KLONOPIN) tablet 2 mg  2 mg Oral QHS Benjamine Mola, FNP   2 mg at 09/22/15 2232  . feeding supplement (ENSURE ENLIVE) (ENSURE ENLIVE) liquid 237 mL  237 mL Oral TID BM [name] , MD   237 mL at 09/22/15 2024  . hydrOXYzine (ATARAX/VISTARIL) tablet 50 mg  50 mg Oral TID PRN Benjamine Mola, FNP   50 mg at 09/22/15 1046  . lithium carbonate capsule 150 mg  150 mg Oral TID WC [name], MD   150 mg at 09/23/15 1709  . magnesium hydroxide (MILK OF MAGNESIA) suspension 30 mL  30 mL Oral Daily PRN Benjamine Mola, FNP      . methocarbamol (ROBAXIN) tablet 750 mg  750 mg Oral Q6H PRN Benjamine Mola, FNP      . OLANZapine (ZYPREXA) tablet 10 mg  10 mg Oral QHS [name], MD   10 mg at 09/22/15 2232  . SUMAtriptan (IMITREX) tablet 25 mg  25 mg Oral Q2H PRN Encarnacion Slates, NP   25 mg at 09/17/15 2304    Lab Results:  Results for orders placed or performed during the hospital encounter of 09/11/15 (from the past 48 hour(s))  Lithium level     Status: None   Collection Time: 09/23/15  7:20 AM  Result Value Ref Range   Lithium Lvl 0.80 0.60 - 1.20 mmol/L    Comment: Performed at [facility]    Physical Findings: AIMS: Facial and Oral Movements Muscles of Facial Expression: None, normal Lips and Perioral Area: None, normal Jaw: None, normal Tongue: None, normal,Extremity Movements Upper (arms, wrists, hands, fingers): None, normal Lower (legs, knees, ankles, toes): None, normal, Trunk Movements Neck, shoulders, hips: None, normal, Overall Severity Severity of abnormal movements (highest score from questions above): None, normal Incapacitation due to abnormal movements:  None, normal Patient's awareness of abnormal movements (rate only patient's report): No Awareness, Dental Status Current problems with teeth and/or dentures?: No Does patient usually wear dentures?: No  CIWA:  CIWA-Ar Total: 1 COWS:  COWS Total Score: 3  Musculoskeletal: Strength & Muscle Tone: decreased Gait & Station: walks with cane, or mobilizes in wheelchair  Patient leans: N/A  Psychiatric Specialty Exam: ROS denies shortness of breath or chest pain, reports  anorexia , no nausea, no vomiting   Blood pressure 121/92, pulse 124, temperature 97.5 F (36.4 C), temperature source Oral, resp. rate 20, height 5' 1.5" (1.562 m), weight 129 lb 8 oz (58.741 kg), last menstrual period  08/07/2015, SpO2 100 %.Body mass index is 24.08 kg/(m^2).  General Appearance: Well Groomed  Eye Contact::  Good  Speech:  Normal Rate  Volume:  Normal  Mood:    Gradually improving .  Affect:   More reactive, smiles at times appropriately, anxious   Thought Process:  Linear  Orientation:  Full (Time, Place, and Person)  Thought Content:   No hallucinations, no delusions  Suicidal Thoughts:  Yes.  without intent/plan- chronic- currently passive - able to contract for safety  Homicidal Thoughts:  No  Memory:  recent and remote grossly intact   Judgement:  Fair  Insight:  Present  Psychomotor Activity:   Improved, more mobile, more visible on unit   Concentration:  Good  Recall:  Good  Fund of Knowledge:Good  Language: Good  Akathisia:  Negative  Handed:  Right  AIMS (if indicated):     Assets:  Communication Skills Resilience  ADL's:   Improved   Cognition: WNL  Sleep:  Number of Hours: 4.75  Assessment -  Patient  Presenting with gradual but noticeable improvement of mood and improved range of affect. She does remain anxious. Chronic suicidal ideations, present for years, persist, but have become passive thoughts with no plan or intention, and she is noticed to be more future oriented. Patient  reports current medication regimen is effective.  Lithium level therapeutic and no side effects reported or noted     Treatment Plan Summary: Daily contact with patient to assess and evaluate symptoms and progress in treatment, Medication management, Plan inpatient treatment and medications as below  Continue  Wellbutrin XL  450  mgrs QDAY to address   Depression Continue Zyprexa  10   mgrs QHS -  to address depression, anxiety, insomnia, mood disorder  Continue Klonopin 2 mgrs QHS to address insomnia, anxiety Continue  Lithium 150 mgrs TID as antidepressant augmentation and to help decrease long term suicidal risk. Continue Vistaril PRNs for severe anxiety if needed  Continue Ensure supplementation.  Encourage ongoing group participation to work on Radiographer, therapeutic and help with symptom reduction Consider   outpatient ECT / maintenance ECT - as part of ongoing outpatient treatment after discharge   , Felicita Gage  MD 09/23/2015, 5:35 PM

## 2015-09-23 NOTE — [facility] Group Notes (Signed)
The focus of this group is to educate the patient on the purpose and policies of crisis stabilization and provide a format to answer questions about their admission.  The group details unit policies and expectations of patients while admitted.  Patient attended 0900 nurse education orientation group this morning.  Patient actively participated and had appropriate affect.  Patient was alert.  Patient had appropriate insight and listened attentively in group.  Today patient will work on 3 goals for discharge.

## 2015-09-23 NOTE — Progress Notes (Signed)
Patient ID: Natasha [name], female   DOB: 04/30/1971, 44 y.o.   MRN: [id number]  Pt currently presents with a flat affect and isolative behavior. Per self inventory, pt rates depression at a 3, hopelessness 9 and anxiety 2. Pt's daily goal is to "be hopeful." Pt reports good sleep, a poor appetite, low energy and good concentration. Pt interacts with peers on unit, jokes with Clinical research associate. Pt anxious about going home and living with her mother.  Pt provided with medications per providers orders. Pt's labs and vitals were monitored throughout the day. Pt supported emotionally and encouraged to express concerns and questions. Pt educated on medications.  Pt's safety ensured with 15 minute and environmental checks. Pt currently denies SI/HI and A/V hallucinations. Pt verbally agrees to seek staff if SI/HI or A/VH occurs and to consult with staff before acting on these thoughts. Pt drank half of an ensure for lunch and finished 25% of a chicken salad for dinner. Will continue POC.

## 2015-09-23 NOTE — Tx Team (Signed)
Interdisciplinary Treatment Plan Update (Adult) Date: 09/23/2015   Date: 09/23/2015 12:39 PM  Progress in Treatment:  Attending groups: Yes  Participating in groups: Yes Taking medication as prescribed: Yes  Tolerating medication: Yes  Family/Significant othe contact made: Yes, with mother Patient understands diagnosis: Yes AEB seeking help for depression Discussing patient identified problems/goals with staff: Yes  Medical problems stabilized or resolved: Yes  Denies suicidal/homicidal ideation: No, Pt endorses passive SI Patient has not harmed self or Others: Yes   New problem(s) identified: None identified at this time.   Discharge Plan or Barriers: CSW will assess for appropriate discharge plan and relevant barriers.   09/16/15: Pt is considering outpatient ECT at [facility] and an [place] program.  09/18/15: Referral made to [place] program. Will follow-up outpatient at [facility] and with her PCP  09/23/15: Awaiting decision from ECT program at [facility].  Additional comments: n/a   Reason for Continuation of Hospitalization:  Anxiety Depression Medication stabilization Suicidal ideation  Estimated length of stay: 2-3 days  Review of initial/current patient goals per problem list:   1.  Goal(s): Patient will participate in aftercare plan  Met:  Yes  Target date: 3-5 days from date of admission   As evidenced by: Patient will participate within aftercare plan AEB aftercare provider and housing plan at discharge being identified.  09/11/15: CSW to work with Pt to assess for appropriate discharge plan and faciliate appointments and referrals as needed prior to d/c. 09/16/15: CSW made referral to [facility]; pt will return home and would like to follow-up with an IOP program. 09/18/15: Pt will discharge home; awaiting ECT decision 09/23/15: Pt will discharge home with maintenance ECT; after improvement with ECT, Pt plans to restart [facility]  2.  Goal  (s): Patient will exhibit decreased depressive symptoms and suicidal ideations.  Met:  Progressing  Target date: 3-5 days from date of admission   As evidenced by: Patient will utilize self rating of depression at 3 or below and demonstrate decreased signs of depression or be deemed stable for discharge by MD. 09/11/15: Pt was admitted with symptoms of depression, rating 10/10. Pt continues to present with flat affect and depressive symptoms.  Pt will demonstrate decreased symptoms of depression and rate depression at 3/10 or lower prior to discharge. 09/16/15: Pt rating depression at "11/10"; endorses passive SI 09/18/15: Pt rates depression at 7/10, endorses passive SI 09/23/15: Pt rates depression at 3/10 but hopelessness at 10/10; endorses passive SI but contracts for safety in the hospital.  3.  Goal(s): Patient will demonstrate decreased signs and symptoms of anxiety.  Met:  No  Target date: 3-5 days from date of admission   As evidenced by: Patient will utilize self rating of anxiety at 3 or below and demonstrated decreased signs of anxiety, or be deemed stable for discharge by MD 09/11/15: Pt was admitted with increased levels of anxiety and is currently rating those symptoms highly. Pt will demonstrated decreased symptoms of anxiety and rate it at 3/10 prior to d/c. 09/16/15: Pt rates anxiety at "11/10" and expresses that her anxiety is not improving. 09/18/15: Pt rates anxiety at 9/10 and presents with anxious affect. 09/23/15: Pt rates anxiety at 10/10.   Attendees:  Patient:    Family:    Physician: Dr. Parke Poisson, MD  09/23/2015 12:39 PM  Nursing: Lars Pinks, RN Case manager  09/23/2015 12:39 PM  Clinical Social Worker [facility], MSW 09/23/2015 12:39 PM  Other: Lucinda Dell, Thera Flake  09/23/2015 12:39 PM  Clinical: Grayland Ormond, RN 09/23/2015 12:39 PM  Other: , RN Charge Nurse 09/23/2015 12:39 PM  Other:     Peri Maris, Latanya Presser MSW

## 2015-09-23 NOTE — Progress Notes (Signed)
Patient ID: Natasha [name], female   DOB: 1971-04-24, 44 y.o.   MRN: [id number] PER STATE REGULATIONS 482.30  THIS CHART WAS REVIEWED FOR MEDICAL NECESSITY WITH RESPECT TO THE PATIENT'S ADMISSION/ DURATION OF STAY.  NEXT REVIEW DATE:  09/27/2015  Willa Rough, RN, BSN CASE MANAGER

## 2015-09-23 NOTE — [facility] Group Notes (Signed)
[facility] LCSW Group Therapy 09/23/2015 1:15 PM  Type of Therapy: Group Therapy- Feelings about Diagnosis  Participation Level: Active   Participation Quality:  Appropriate  Affect:  Appropriate  Cognitive: Alert and Oriented   Insight:  Developing   Engagement in Therapy: Developing/Improving and Engaged   Modes of Intervention: Clarification, Confrontation, Discussion, Education, Exploration, Limit-setting, Orientation, Problem-solving, Rapport Building, Dance movement psychotherapist, Socialization and Support  Description of Group:   This group will allow patients to explore their thoughts and feelings about diagnoses they have received. Patients will be guided to explore their level of understanding and acceptance of these diagnoses. Facilitator will encourage patients to process their thoughts and feelings about the reactions of others to their diagnosis, and will guide patients in identifying ways to discuss their diagnosis with significant others in their lives. This group will be process-oriented, with patients participating in exploration of their own experiences as well as giving and receiving support and challenge from other group members.  Summary of Progress/Problems:  Pt affect brighter today during discussion; also more audible with louder volume of speech. Pt also less self-deprecating and was able to quickly identify a quality about herself that she liked, describing herself as tenacious with her physical disability.  She describes that being prescribed medication for the first time was "unnerving" as it made her diagnosis "real" to her.  She reports that she often feels stigmatized by society and even judged at the pharmacy when picking up medication.  Therapeutic Modalities:   Cognitive Behavioral Therapy Solution Focused Therapy Motivational Interviewing Relapse Prevention Therapy  Chad Cordial, LCSWA 09/23/2015 4:39 PM

## 2015-09-23 NOTE — Progress Notes (Signed)
Patient ID: Natasha [name], female   DOB: [birth year], 44 y.o.   MRN: [id number] Pt fell out of bed this evening. Reported she had her feet tangled in blanket. Vital signs stable. Denies hitting her head and pain. Reported to [facility]. No new orders given.

## 2015-09-24 NOTE — Progress Notes (Signed)
D-Patient express some anxiety about going home tomorrow. "I feel safe and comfortable here."  It is hard to leave."  Patient states she has moments of depression.  She rates this as a 5/10.  Denies SI/HI/AVH.   She is trying to develop positive coping skills and not think about self harm, A-Meds given as ordered by MD. Routine 15 checks continue.  Patient up to tub for bath.  States the bath helped to relax her. Support and encouragement provided. Patient told to notify staff with any problems or concerns. R-Patient will contract for safety at this time.  No adverse reactions to meds noted.  Pt up going to group therapy with active participation.  Will continue to monitor for safety.

## 2015-09-24 NOTE — Progress Notes (Signed)
Recreation Therapy Notes  Date: 10.12.2016 Time: 9:30am Location: [address] Group Room   Group Topic: Stress Management  Goal Area(s) Addresses:  Patient will actively participate in stress management techniques presented during session.   Behavioral Response: Did not attend.  Syeda Prickett L Jacilyn Sanpedro, LRT/CTRS        Jamilee Lafosse L 09/24/2015 1:05 PM 

## 2015-09-24 NOTE — Progress Notes (Signed)
Patient ID: Natasha [name], female   DOB: [birth year], 44 y.o.   MRN: [id number] [facility] MD Progress Note  09/24/2015 5:47 PM Natasha [name]  MRN:  [id number] Subjective:   Patient continues to report  Partial improvement and today seems more future oriented, and seems less anxious and less ruminative about discharging soon. Denies medication side effects- states she feels current medication regimen has been helpful, and at this time does not endorse side effects. Anorexia seems to be improving and patient states she had some of her breakfast this morning.  Objective : I have discussed case with treatment team and have met with patient. She remains  visible on unit, active in milieu and groups. Has not exhibited any further episodes of self injurious behaviors . She is tolerating medications well. She is still reporting a subjective sense of hopelessness, but reports her mood is better, and does present with a significantly improved range of affect. As her mood has improved, her focus on suicidal ideations has decreased and she is talking more about other issues , and less negative issues . 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 She plans to return home upon discharge , and plans to continue medications and " I think I am going to continue maintenance ECT as well ". She is more responsive to and amenable to reviewing coping skills and ego strengths, and seems less focused on negative issues and less prone to self deprecation / demonstrating some improved sense of self esteem at this time.    Principal Problem: MDD (major depressive disorder), recurrent severe, without psychosis ([place]) Diagnosis:   Patient Active Problem List   Diagnosis Date Noted  . MDD (major depressive disorder), recurrent severe, without psychosis ([place]) [F33.2] 09/11/2015  . Major depressive disorder,  recurrent, severe without psychotic features ([place]) [F33.2]   . Severe recurrent major depression without psychotic features ([address]) [F33.2] 06/19/2015   Total Time spent with patient: 20 minutes    Past Medical History:  Past Medical History  Diagnosis Date  . C6 spinal cord injury   . Depression     Past Surgical History  Procedure Laterality Date  . Back surgery     Family History:  Family History  Problem Relation Age of Onset  . Alcohol abuse Father   . Alcohol abuse Brother     Social History:  History  Alcohol Use  . Yes    Comment: One drink a week at most; rarely drinks     History  Drug Use No    Social History   Social History  . Marital Status: Single    Spouse Name: N/A  . Number of Children: N/A  . Years of Education: N/A   Social History Main Topics  . Smoking status: Never Smoker   . Smokeless tobacco: Never Used     Comment: No smoking hx; no need for cessation materials  . Alcohol Use: Yes     Comment: One drink a week at most; rarely drinks  . Drug Use: No  . Sexual Activity: No   Other Topics Concern  . None   Social History Narrative   Additional Social History:    Pain Medications: Pt denies  Prescriptions: Zyprexa, Klonopin, Wellbutrin Over the Counter: Pt denies History of alcohol / drug use?: No history of alcohol / drug abuse Longest period of  sobriety (when/how long): NA  Sleep:  Fair   Appetite:   Poor today, drinks 3 Ensure a day.   Current Medications: Current Facility-Administered Medications  Medication Dose Route Frequency Provider Last Rate Last Dose  . acetaminophen (TYLENOL) tablet 650 mg  650 mg Oral Q6H PRN Benjamine Mola, FNP   650 mg at 09/21/15 6168  . alum & mag hydroxide-simeth (MAALOX/MYLANTA) 200-200-20 MG/5ML suspension 30 mL  30 mL Oral Q4H PRN Benjamine Mola, FNP      . buPROPion (WELLBUTRIN XL) 24 hr tablet 450 mg  450 mg Oral Daily [name], MD   450 mg at 09/24/15 0808  . clonazePAM  (KLONOPIN) tablet 2 mg  2 mg Oral QHS Benjamine Mola, FNP   2 mg at 09/23/15 2206  . feeding supplement (ENSURE ENLIVE) (ENSURE ENLIVE) liquid 237 mL  237 mL Oral TID BM [name] Gaynell Eggleton, MD   237 mL at 09/24/15 1535  . hydrOXYzine (ATARAX/VISTARIL) tablet 50 mg  50 mg Oral TID PRN Benjamine Mola, FNP   50 mg at 09/22/15 1046  . lithium carbonate capsule 150 mg  150 mg Oral TID WC [name], MD   150 mg at 09/24/15 1610  . magnesium hydroxide (MILK OF MAGNESIA) suspension 30 mL  30 mL Oral Daily PRN Benjamine Mola, FNP      . methocarbamol (ROBAXIN) tablet 750 mg  750 mg Oral Q6H PRN Benjamine Mola, FNP      . OLANZapine (ZYPREXA) tablet 10 mg  10 mg Oral QHS [name], MD   10 mg at 09/23/15 2207  . SUMAtriptan (IMITREX) tablet 25 mg  25 mg Oral Q2H PRN Encarnacion Slates, NP   25 mg at 09/17/15 2304    Lab Results:  Results for orders placed or performed during the hospital encounter of 09/11/15 (from the past 48 hour(s))  Lithium level     Status: None   Collection Time: 09/23/15  7:20 AM  Result Value Ref Range   Lithium Lvl 0.80 0.60 - 1.20 mmol/L    Comment: Performed at [facility]    Physical Findings: AIMS: Facial and Oral Movements Muscles of Facial Expression: None, normal Lips and Perioral Area: None, normal Jaw: None, normal Tongue: None, normal,Extremity Movements Upper (arms, wrists, hands, fingers): None, normal Lower (legs, knees, ankles, toes): None, normal, Trunk Movements Neck, shoulders, hips: None, normal, Overall Severity Severity of abnormal movements (highest score from questions above): None, normal Incapacitation due to abnormal movements: None, normal Patient's awareness of abnormal movements (rate only patient's report): No Awareness, Dental Status Current problems with teeth and/or dentures?: No Does patient usually wear dentures?: No  CIWA:  CIWA-Ar Total: 1 COWS:  COWS Total Score: 3  Musculoskeletal: Strength & Muscle  Tone: decreased Gait & Station: walks with cane, or mobilizes in wheelchair  Patient leans: N/A  Psychiatric Specialty Exam: ROS denies shortness of breath or chest pain, reports  anorexia , no nausea, no vomiting   Blood pressure 108/74, pulse 96, temperature 98.5 F (36.9 C), temperature source Oral, resp. rate 16, height 5' 1.5" (1.562 m), weight 130 lb (58.968 kg), last menstrual period 08/07/2015, SpO2 100 %.Body mass index is 24.17 kg/(m^2).  General Appearance: Well Groomed  Engineer, water::  Good  Speech:  Normal Rate  Volume:  Normal  Mood:   improving  Affect:   More reactive,  Less anxious today  Thought Process:  Linear  Orientation:  Full (Time, Place, and Person)  Thought Content:   No hallucinations, no delusions  Suicidal Thoughts:  No-  Today denies current suicidal ideations - able to contract for safety  Homicidal Thoughts:  No  Memory:  recent and remote grossly intact   Judgement:  Fair  Insight:  Present  Psychomotor Activity:   Improved, more mobile, more visible on unit   Concentration:  Good  Recall:  Good  Fund of Knowledge:Good  Language: Good  Akathisia:  Negative  Handed:  Right  AIMS (if indicated):     Assets:  Communication Skills Resilience  ADL's:   Improved   Cognition: WNL  Sleep:  Number of Hours: 6.75  Assessment -  Patient 's mood and  Affect continue to improve and today presents less  anxious.  Also, today denying current suicidal ideations,  And is more future oriented.   No medication side effects reported     Treatment Plan Summary: Daily contact with patient to assess and evaluate symptoms and progress in treatment, Medication management, Plan inpatient treatment and medications as below  Continue  Wellbutrin XL  450  mgrs QDAY to address   Depression Continue Zyprexa  10   mgrs QHS -  to address depression, anxiety, insomnia, mood disorder  Continue Klonopin 2 mgrs QHS to address insomnia, anxiety Continue  Lithium 150 mgrs TID as  antidepressant augmentation and to help decrease long term suicidal risk. Continue Vistaril PRNs for severe anxiety if needed  Continue Ensure supplementation.  Encourage ongoing group participation to work on Radiographer, therapeutic and help with symptom reduction Consider   outpatient ECT / maintenance ECT - as part of ongoing outpatient treatment after discharge   Aldin Drees, Felicita Gage  MD 09/24/2015, 5:47 PM

## 2015-09-24 NOTE — Progress Notes (Signed)
D: Patient in the dayroom on approach.  Patient states she had a much better day  Today.  Patient states she is supposed to be discharged tomorrow but states she is anxious about it. Patient states she had been eating better and attending groups.  Patient states she cannot remember her goal for today. Patient denies SI/HI and denies AVH.   A: Staff to monitor Q 15 mins for safety.  Encouragement and support offered.  Scheduled medications administered per orders. R: Patient remains safe on the unit.  Patient attended group tonight.  Patient visible on the unit and interacting with peers.  Patient taking administered medications.

## 2015-09-24 NOTE — [facility] Group Notes (Signed)
[facility] LCSW Group Therapy 09/24/2015 1:15 PM  Type of Therapy: Group Therapy- Emotion Regulation  Participation Level: Active   Participation Quality:  Appropriate  Affect: Appropriate  Cognitive: Alert and Oriented   Insight:  Developing/Improving  Engagement in Therapy: Developing/Improving and Engaged   Modes of Intervention: Clarification, Confrontation, Discussion, Education, Exploration, Limit-setting, Orientation, Problem-solving, Rapport Building, Dance movement psychotherapisteality Testing, Socialization and Support  Summary of Progress/Problems: The topic for group today was emotional regulation. This group focused on both positive and negative emotion identification and allowed group members to process ways to identify feelings, regulate negative emotions, and find healthy ways to manage internal/external emotions. Group members were asked to reflect on a time when their reaction to an emotion led to a negative outcome and explored how alternative responses using emotion regulation would have benefited them. Group members were also asked to discuss a time when emotion regulation was utilized when a negative emotion was experienced. Pt continues to be active in group discussion, identifying sadness and guilt as emotions that are difficult for her to regulate. Pt expresses that a negative coping mechanism that she uses to regulate these emotions is self-harm. Pt reports that she is motivated to change this behavior but comments are ambivalent as she describes feeling like this coping skill is the only one that works.   Chad CordialLauren Carter, LCSWA 09/24/2015 2:44 PM

## 2015-09-24 NOTE — [facility] Group Notes (Signed)
[facility]  09/24/2015 8:45 AM  Participation Quality: Alert, Appropriate and Oriented  Mood/Affect: Appropriate  Depression Rating: 2  Anxiety Rating: 7-8  Thoughts of Suicide: Pt denies SI/HI; passive SH urges  Will you contract for safety? Yes  Current AVH: Pt denies  Plan for Discharge/Comments: Pt attended discharge planning group and actively participated in group. CSW discussed suicide prevention education with the group and encouraged them to discuss discharge planning and any relevant barriers. Pt presents with brighter affect and reports decreased depression. Pt expressed that she is ready to go home tomorrow and feels good about her aftercare plan.  Transportation Means: Pt reports access to transportation  Supports: No supports mentioned at this time  Chad CordialLauren Carter, LCSWA 09/24/2015 9:15 AM

## 2015-09-25 MED ORDER — LITHIUM CARBONATE 150 MG PO CAPS: 150.0000 mg | ORAL_CAPSULE | Freq: Three times a day (TID) | ORAL | Status: DC

## 2015-09-25 MED ORDER — CLONAZEPAM 2 MG PO TABS: 2.0000 mg | ORAL_TABLET | Freq: Every day | ORAL | Status: DC

## 2015-09-25 MED ORDER — OLANZAPINE 10 MG PO TABS: 10.0000 mg | ORAL_TABLET | Freq: Every day | ORAL | Status: DC

## 2015-09-25 MED ORDER — BUPROPION HCL ER (XL) 450 MG PO TB24: 450.0000 mg | ORAL_TABLET | Freq: Every day | ORAL | Status: DC

## 2015-09-25 MED ORDER — SUMATRIPTAN SUCCINATE 25 MG PO TABS: 25.0000 mg | ORAL_TABLET | ORAL | Status: DC | PRN

## 2015-09-25 MED ORDER — METHOCARBAMOL 750 MG PO TABS: 750.0000 mg | ORAL_TABLET | Freq: Four times a day (QID) | ORAL | Status: DC | PRN

## 2015-09-25 MED ORDER — HYDROXYZINE HCL 50 MG PO TABS: 50.0000 mg | ORAL_TABLET | Freq: Three times a day (TID) | ORAL | Status: DC | PRN

## 2015-09-25 NOTE — Plan of Care (Signed)
Problem: Alteration in mood Goal: STG-Patient is able to discuss feelings and issues (Patient is able to discuss feelings and issues leading to depression)  Patient discussed feelings about discharge.

## 2015-09-25 NOTE — Plan of Care (Signed)
Problem: Alteration in mood Goal: LTG-Patient reports reduction in suicidal thoughts (Patient reports reduction in suicidal thoughts and is able to verbalize a safety plan for whenever patient is feeling suicidal)  Outcome: Progressing Patient denies SI.  Patient verbally contracts for safety.     

## 2015-09-25 NOTE — [facility] Group Notes (Signed)
[facility] 09/25/2015 1:15pm  Type of Therapy: Mental Health Association Presentation  Pt did not attend, declined invitation. Pt has seen speaker multiple times  Chad CordialLauren Carter, LCSWA 09/25/2015 2:00 PM

## 2015-09-25 NOTE — Tx Team (Signed)
Interdisciplinary Treatment Plan Update (Adult) Date: 09/25/2015   Date: 09/25/2015 10:13 AM  Progress in Treatment:  Attending groups: Yes  Participating in groups: Yes Taking medication as prescribed: Yes  Tolerating medication: Yes  Family/Significant othe contact made: Yes, with mother Patient understands diagnosis: Yes AEB seeking help for depression Discussing patient identified problems/goals with staff: Yes  Medical problems stabilized or resolved: Yes  Denies suicidal/homicidal ideation: No, Pt endorses passive SI Patient has not harmed self or Others: Yes   New problem(s) identified: None identified at this time.   Discharge Plan or Barriers: CSW will assess for appropriate discharge plan and relevant barriers.   09/16/15: Pt is considering outpatient ECT at [facility] and an [place] program.  09/18/15: Referral made to [place] program. Will follow-up outpatient at [facility] and with her PCP  09/23/15: Awaiting decision from ECT program at [facility].  09/25/15: Pt will discharge home today and follow-up with her PCP, [facility], and ECT with [facility]  Additional comments: n/a   Reason for Continuation of Hospitalization:  Anxiety Depression Medication stabilization Suicidal ideation  Estimated length of stay: 0 days; Pt stable for DC today  Review of initial/current patient goals per problem list:   1.  Goal(s): Patient will participate in aftercare plan  Met:  Yes  Target date: 3-5 days from date of admission   As evidenced by: Patient will participate within aftercare plan AEB aftercare provider and housing plan at discharge being identified.  09/11/15: CSW to work with Pt to assess for appropriate discharge plan and faciliate appointments and referrals as needed prior to d/c. 09/16/15: CSW made referral to [place]; pt will return home and would like to follow-up with an IOP program. 09/18/15: Pt will discharge home; awaiting ECT  decision 09/23/15: Pt will discharge home with maintenance ECT; after improvement with ECT, Pt plans to restart [facility]OP  2.  Goal (s): Patient will exhibit decreased depressive symptoms and suicidal ideations.  Met:  Yes  Target date: 3-5 days from date of admission   As evidenced by: Patient will utilize self rating of depression at 3 or below and demonstrate decreased signs of depression or be deemed stable for discharge by MD. 09/11/15: Pt was admitted with symptoms of depression, rating 10/10. Pt continues to present with flat affect and depressive symptoms.  Pt will demonstrate decreased symptoms of depression and rate depression at 3/10 or lower prior to discharge. 09/16/15: Pt rating depression at "11/10"; endorses passive SI 09/18/15: Pt rates depression at 7/10, endorses passive SI 09/23/15: Pt rates depression at 3/10 but hopelessness at 10/10; endorses passive SI but contracts for safety in the hospital. 09/25/15: Pt rates depression at 3/10; denies SI  3.  Goal(s): Patient will demonstrate decreased signs and symptoms of anxiety.  Met:  Adequate for DC  Target date: 3-5 days from date of admission   As evidenced by: Patient will utilize self rating of anxiety at 3 or below and demonstrated decreased signs of anxiety, or be deemed stable for discharge by MD 09/11/15: Pt was admitted with increased levels of anxiety and is currently rating those symptoms highly. Pt will demonstrated decreased symptoms of anxiety and rate it at 3/10 prior to d/c. 09/16/15: Pt rates anxiety at "11/10" and expresses that her anxiety is not improving. 09/18/15: Pt rates anxiety at 9/10 and presents with anxious affect. 09/23/15: Pt rates anxiety at 10/10.  09/25/15: Pt reports her anxiety is decreasing and MD feels that symptoms have decreased  to the point where they can be managed in the outpatient setting.  Attendees:  Patient:    Family:    Physician: Dr. Parke Poisson, MD  09/25/2015 10:13 AM  Nursing:  Lars Pinks, RN Case manager  09/25/2015 10:13 AM  Clinical Social Worker Norman Clay, MSW 09/25/2015 10:13 AM  Other: Jake Bathe Liasion 09/25/2015 10:13 AM  Clinical: Loletta Specter, RN 09/25/2015 10:13 AM  Other: , RN Charge Nurse 09/25/2015 10:13 AM  Other:     Peri Maris, Latanya Presser MSW

## 2015-09-25 NOTE — Progress Notes (Signed)
  [facility] Dis[facility] :  Will you be returning to the same living situation after discharge:  Yes,  Pt returning home with mother At discharge, do you have transportation home?: Yes,  mother to provide transportation Do you have the ability to pay for your medications: Yes,  Pt provided with prescriptions'  Release of information consent forms completed and in the chart;  Patient's signature needed at discharge.  Patient to Follow up at: Follow-up Information    Follow up with [facility] On 10/06/2015.   Specialty:  Behavioral Health   Why:  at 8:15 for therapy with Mikel CellaFrankie   Contact information:   [address] [place] [id number] [phone]      [facility] 10[facility].   Specialty:  Behavioral Health   Why:  at 8:30am for medication management with Dr. Lolly MustacheArfeen.   Contact information:   [address] [place] [id number] [phone]      [facility].   Why:  at 11:00am with Terie PurserSamantha [place] for medication management/hospital discharge appointment. Please arrive at 10:30am   Contact information:   [address], [address] [place], [place] [address] Phone: [phone]  Fax: [phone]       [facility]   Why:  for your maintenance ECT appointment. Expect to be there at 8am; staff will call you with your specific procedure time   Contact information:   [address] [place], [place] [address]  [phone] Fax: [phone]      Patient denies SI/HI: Yes,  Pt denies    Safety Planning and Suicide Prevention discussed: Yes,  with mother; see SPE note for further details  Have you used any form of tobacco in the last 30 days? (Cigarettes, Smokeless Tobacco, Cigars, and/or Pipes): No  Has patient been referred to  the Quitline?: N/A patient is not a smoker  Elaina HoopsCarter, Larysa Pall M 09/25/2015, 10:18 AM

## 2015-09-25 NOTE — Progress Notes (Signed)
Discharge Note D: Patient ready for discharge. Patient's mother is coming to pick up patient, and patient to return to previous living situation with mother. Patient reports some anxiety in regards to discharge, but states "I'm apprehensive about going home, but I feel more equipped this time and the after-care is in place."  A: Provided discharge education and materials. Provided RX. Returned all patient belongings.  R: Patient acknowledged understanding of discharge teaching. Patient had no questions nor concerns. Patient left with mother. Patient left with all belongings. Patient accompanied to to mom's car via wheelchair.

## 2015-09-25 NOTE — Discharge Summary (Signed)
Physician Discharge Summary Note  Patient:  Natasha [name] is an 44 y.o., female MRN:  [id number] DOB:  09/07/1971 Patient phone:  [phone] (home)  Patient address:   [address] [place] [address],  Total Time spent with patient: 45 minutes  Date of Admission:  09/11/2015 Date of Discharge: 06/25/2015  Reason for Admission:  Per H&P Note:  History of Present Illness: Natasha [name] is an 44 y.o. female who came to [facility] to have an intake for the partial program, but upon arrival was told she Was not appropriate for the program since she was 30. Pt states she had been in the IOP program, but stopped coming. PT was most recently hospitalized at [facility] and also given ECT at [facility], which she says helped her. Pt states, "I need structure, and a coordinated plan". She also states she had an intake with Dr. Betti [name] for OP recently and arrived and was told she had been called and had been rescheduled. She states she has seen him in the past, and that "it worked", but she has not seen him recently. Pt voiced preference not to go back to [facility].  Pt states that she is suicidal with a plan to cut herself and "bleed out, but you have to make sure you aren't at home" and also states she has been researching OD with Cyanide on the Internet. She has a long history of attempts and stated that in 2002, [facility] discharged her "before I was ready" and she jumped off the roof and injured her spine. "I have a lot of regrets", pt states. Pt states that things are very stressful at home sine her mom has been diagnosed with Parkinson's, and pt does not drive, and her mom is limited with driving. Pt is on disability. She states she has a brother who lives in [place], and is a limited support. She states she has good friends who are a support, but, "They have their own lives".  [name] reports that she has a history of MDD and multiple past suicide attempts, but has been doing well since 2002 when  she made her last attempt by jumping off the parking deck immediately after discharge from [facility]. She reports that her depression has been managed well since her physical recovery took place, but that the hardest part has been that when she was feeling her best emotionally she know had the physical limitations of her spinal cord injury since the attempt.  Pt seen and chart reviewed on 09/11/15 for H&P. Pt known to this provider and Dr. Jama [name] from last admission. Pt is alert/oriented x4, calm, cooperative, and appropriate, yet clearly depressed. Pt was in [facility] in June 2016, then went to IOP, then to [facility] for ECT, then to [facility], and then home. Pt reports that she was intermittently non-compliant recently,b ut that she was frustrating with having to stop all her medications for ECT, only to achieve minimal results. She reports feeling bounced around by the healthcare system.  Pt reports her long-term triggers of family strain and her disabilities from her suicide attempt, but the primary trigger seems to be the medication changes. Pt reportedly stopped taking her lithium 1 month ago and that it was not yet therapeutic because they had her stop taking it for ECT reasons. Pt had only been taking it a few weeks. She reports spasms in her back that have been going on for a long time and that this helps with that.  Pt affirms suicidal ideation with intent, but is able to contract for safety while inpatient. Denies homicidal ideation and psychosis and does not appear to be responding to internal stimuli. Pt is visibly depressed and rates it 10/10. Pt minimizes her anxiety but pt is known to have a history of severe anxiety and does admit to having panic episodes in the past. We will take this into consideration when evaluating her SNRI dosage vs. SSRI and mood stabilizers as well because pt also has a history of severe demotivation and has responded very well to Wellbutrin in that  regard.  Principal Problem: MDD (major depressive disorder), recurrent severe, without psychosis [facility]) Discharge Diagnoses: Patient Active Problem List   Diagnosis Date Noted  . MDD (major depressive disorder), recurrent severe, without psychosis (HCC) [F33.2] 09/11/2015    Priority: High  . Major depressive disorder, recurrent, severe without psychotic features (HCC) [F33.2]   . Severe recurrent major depression without psychotic features (HCC) [F33.2] 06/19/2015    Musculoskeletal: Strength & Muscle Tone: within normal limits Gait & Station: normal Patient leans: N/A  Psychiatric Specialty Exam:  See Suicide Risk Assessment Physical Exam  Nursing note and vitals reviewed. Constitutional: She is oriented to person, place, and time.  Neck: Normal range of motion.  Respiratory: Effort normal.  Musculoskeletal: Normal range of motion.  Neurological: She is alert and oriented to person, place, and time.  Psychiatric: Her behavior is normal. Thought content normal. Her mood appears anxious (stable).    Review of Systems  Psychiatric/Behavioral: Positive for depression (Stable). Negative for suicidal ideas and hallucinations. The patient is nervous/anxious (Stable) and has insomnia (stable).   All other systems reviewed and are negative.   Blood pressure 113/92, pulse 110, temperature 98.7 F (37.1 C), temperature source Oral, resp. rate 16, height 5' 1.5" (1.562 m), weight 58.968 kg (130 lb), last menstrual period 08/07/2015, SpO2 100 %.Body mass index is 24.17 kg/(m^2).  Have you used any form of tobacco in the last 30 days? (Cigarettes, Smokeless Tobacco, Cigars, and/or Pipes): No  Has this patient used any form of tobacco in the last 30 days? (Cigarettes, Smokeless Tobacco, Cigars, and/or Pipes) No  Past Medical History:  Past Medical History  Diagnosis Date  . C6 spinal cord injury   . Depression     Past Surgical History  Procedure Laterality Date  . Back surgery      Family History:  Family History  Problem Relation Age of Onset  . Alcohol abuse Father   . Alcohol abuse Brother    Social History:  History  Alcohol Use  . Yes    Comment: One drink a week at most; rarely drinks     History  Drug Use No    Social History   Social History  . Marital Status: Single    Spouse Name: N/A  . Number of Children: N/A  . Years of Education: N/A   Social History Main Topics  . Smoking status: Never Smoker   . Smokeless tobacco: Never Used     Comment: No smoking hx; no need for cessation materials  . Alcohol Use: Yes     Comment: One drink a week at most; rarely drinks  . Drug Use: No  . Sexual Activity: No   Other Topics Concern  . None   Social History Narrative   Risk to Self: Suicidal Ideation: Yes-Currently Present Suicidal Intent: Yes-Currently Present Is patient at risk for suicide?: Yes Suicidal Plan?: Yes-Currently Present Specify Current Suicidal Plan:  Bleeding out, Cyanide Access to Means: Yes Specify Access to Suicidal Means: razor, internet to order cyanide What has been your use of drugs/alcohol within the last 12 months?: denies Other Self Harm Risks: cutting Triggers for Past Attempts: Unpredictable Intentional Self Injurious Behavior: Cutting Risk to Others: Homicidal Ideation: No Thoughts of Harm to Others: No Current Homicidal Intent: No Current Homicidal Plan: No Access to Homicidal Means: No History of harm to others?: No Assessment of Violence: None Noted Does patient have access to weapons?: No Criminal Charges Pending?: No Does patient have a court date: No Prior Inpatient Therapy: Prior Inpatient Therapy: Yes Prior Therapy Dates: 30+ Prior Therapy Facilty/Provider(s): Multiple Reason for Treatment: MDD Prior Outpatient Therapy: Prior Outpatient Therapy: Yes Prior Therapy Dates: 2016 [facility] Reason for Treatment: MDD Does patient have an ACCT team?:  No Does patient have Intensive In-House Services?  : No Does patient have [facility] services? : No Does patient have P4CC services?: No  Level of Care:  OP  Hospital Course:  Aline Philipp OvensK Tom[place] was admitted for MDD (major depressive disorder), recurrent severe, without psychosis (HCC) and crisis management.  He was treated discharged with the medications listed below under Medication List.  Medical problems were identified and treated as needed.  Home medications were restarted as appropriate.  Improvement was monitored by observation and Melven SartoriusJenifer K Maturin daily report of symptom reduction.  Emotional and mental status was monitored by daily self-inventory reports completed by Melven SartoriusJenifer K Doody and clinical staff.         Trude Philipp OvensK Hickox was evaluated by the treatment team for stability and plans for continued recovery upon discharge.  Calvin Philipp OvensK [place] motivation was an integral factor for scheduling further treatment.  Employment, transportation, bed availability, health status, family support, and any pending legal issues were also considered during his hospital stay.  He was offered further treatment options upon discharge including but not limited to Residential, Intensive Outpatient, and Outpatient treatment.  Britzy Philipp OvensK Teare will follow up with the services as listed below under Follow Up Information.     Upon completion of this admission the patient was both mentally and medically stable for discharge denying suicidal/homicidal ideation, auditory/visual/tactile hallucinations, delusional thoughts and paranoia.      Consults:  psychiatry  Significant Diagnostic Studies:  labs: Lithium level, MMP, Urinalysis, Urine microscopic,   Discharge Vitals:   Blood pressure 113/92, pulse 110, temperature 98.7 F (37.1 C), temperature source Oral, resp. rate 16, height 5' 1.5" (1.562 m), weight 58.968 kg (130 lb), last menstrual period 08/07/2015, SpO2 100 %. Body mass index is 24.17 kg/(m^2). Lab Results:    Results for orders placed or performed during the hospital encounter of 09/11/15 (from the past 72 hour(s))  Lithium level     Status: None   Collection Time: 09/23/15  7:20 AM  Result Value Ref Range   Lithium Lvl 0.80 0.60 - 1.20 mmol/L    Comment[facility]    Physical Findings: AIMS: Facial and Oral Movements Muscles of Facial Expression: None, normal Lips and Perioral Area: None, normal Jaw: None, normal Tongue: None, normal,Extremity Movements Upper (arms, wrists, hands, fingers): None, normal Lower (legs, knees, ankles, toes): None, normal, Trunk Movements Neck, shoulders, hips: None, normal, Overall Severity Severity of abnormal movements (highest score from questions above): None, normal Incapacitation due to abnormal movements: None, normal Patient's awareness of abnormal movements (rate only patient's report): No Awareness, Dental Status Current problems with teeth  and/or dentures?: No Does patient usually wear dentures?: No  CIWA:  CIWA-Ar Total: 1 COWS:  COWS Total Score: 3   See Psychiatric Specialty Exam and Suicide Risk Assessment completed by Attending Physician prior to discharge.  Discharge destination:  Home  Is patient on multiple antipsychotic therapies at discharge:  Yes,   Do you recommend tapering to monotherapy for antipsychotics?  No   Has Patient had three or more failed trials of antipsychotic monotherapy by history:  No    Recommended Plan for Multiple Antipsychotic Therapies: Additional reason(s) for multiple antispychotic treatment:  Patient stabilization     Medication List    STOP taking these medications        cyclobenzaprine 10 MG tablet  Commonly known as:  FLEXERIL     OLANZapine zydis 5 MG disintegrating tablet  Commonly known as:  ZYPREXA  Replaced by:  OLANZapine 10 MG tablet     Vitamin D3 2000 UNITS Tabs      TAKE these medications      Indication   BuPROPion HCl ER (XL) 450 MG  Tb24  Take 450 mg by mouth daily.   Indication:  Major Depressive Disorder     clonazePAM 2 MG tablet  Commonly known as:  KLONOPIN  Take 1 tablet (2 mg total) by mouth at bedtime.   Indication:  anxiety/sleep     hydrOXYzine 50 MG tablet  Commonly known as:  ATARAX/VISTARIL  Take 1 tablet (50 mg total) by mouth 3 (three) times daily as needed for anxiety.   Indication:  Anxiety     lithium carbonate 150 MG capsule  Take 1 capsule (150 mg total) by mouth 3 (three) times daily with meals.   Indication:  Mood control     methocarbamol 750 MG tablet  Commonly known as:  ROBAXIN  Take 1 tablet (750 mg total) by mouth every 6 (six) hours as needed for muscle spasms.   Indication:  Musculoskeletal Pain     OLANZapine 10 MG tablet  Commonly known as:  ZYPREXA  Take 1 tablet (10 mg total) by mouth at bedtime.   Indication:  mood stabilization     SUMAtriptan 25 MG tablet  Commonly known as:  IMITREX  Take 1 tablet (25 mg total) by mouth every 2 (two) hours as needed for migraine or headache. May repeat in 2 hours if headache persists or recurs.   Indication:  Headache, Migraine Headache       Follow-up Information    Follow up with [facility] On 10/06/2015.   Specialty:  Behavioral Health   Why:  at 8:15 for therapy with Mikel Cella information:   [address] [place] [address] [phone]      Follow up with [facility] On 10/23/2015.   Specialty:  Behavioral Health   Why:  at 8:30am for medication management with Dr. Lolly Mustache.   Contact information:   [address] [place] [address] [phone]      Follow up with [facility] On 10/01/2015.   Why:  at 11:00am with Terie Purser for medication management/hospital discharge appointment. Please arrive at 10:30am   Contact information:   [address] [place], [place] [address] Phone: [phone]  Fax: [phone]       Follow up with [facility] On 09/30/2015.   Why:  for your maintenance ECT appointment. Expect to be there at 8am;  staff will call you with your specific procedure time   Contact information:   [address] [place], [place] [address]  [phone] Fax: [phone]      Follow-up recommendations:  Activity:  As tolerated Diet:  As tolerated  Comments:   Patient has been instructed to take medications as prescribed; and report adverse effects to outpatient provider.  Follow up with primary doctor for any medical issues and If symptoms recur report to nearest emergency or crisis hot line.    Total Discharge Time: 45 minutes  Signed: Beau Fanny, FNP-BC 09/25/2015, 11:52 AM  Patient seen, Suicide Assessment Completed.  Disposition Plan Reviewed

## 2015-09-25 NOTE — [facility] Group Notes (Signed)
[facility] Group Notes:  (Nursing/MHT/Case Management/Adjunct)  Date:  09/25/2015  Time:  1000 Type of Therapy:  Nurse Education  Participation Level:  Active  Participation Quality:  Appropriate and Attentive  Affect:  Appropriate  Cognitive:  Alert and Appropriate  Insight:  Appropriate and Good  Engagement in Group:  Engaged  Modes of Intervention:  Discussion, Education and Exploration  Summary of Progress/Problems: Group topic was on leisure and lifestyle changes.  Discussed the importance of engaging in a positive activities.  Also discussed power of positive thinking which helps boost self esteem.  Patient was receptive and participated.  States goal is "to be calm as much as possible." Natasha [name] 09/25/2015, 12:34 PM

## 2015-09-25 NOTE — [facility] Suicide Risk Assessment (Signed)
[facility] Discharge Suicide Risk Assessment   Demographic Factors:  44 year old single female, lives with mother, disabled   Total Time spent with patient: 30 minutes  Musculoskeletal: Strength & Muscle Tone: decreased- history or quadriparesia Gait & Station: walks slowly Patient leans: N/A  Psychiatric Specialty Exam: Physical Exam  ROS  Blood pressure 113/92, pulse 110, temperature 98.7 F (37.1 C), temperature source Oral, resp. rate 16, height 5' 1.5" (1.562 m), weight 130 lb (58.968 kg), last menstrual period 08/07/2015, SpO2 100 %.Body mass index is 24.17 kg/(m^2).  General Appearance: Well Groomed  Eye Contact::  Good  Speech:  Normal Rate409  Volume:  Normal  Mood:  improved, states she feels she is doing better and states she is " feeling pretty good today"  Affect:  Appropriate, reactive, today smiling appropriately  Thought Process:  Goal Directed and Linear  Orientation:  Full (Time, Place, and Person)  Thought Content:  no hallucinations , no delusions   Suicidal Thoughts:  No at this time denies suicidal ideations or any self injurious ideations  Homicidal Thoughts:  No  Memory:  recent and remote grossly intact   Judgement:  Other:  improved  Insight:  Present  Psychomotor Activity:  Normal  Concentration:  Good  Recall:  Good  Fund of Knowledge:Good  Language: Good  Akathisia:  Negative  Handed:  Right  AIMS (if indicated):     Assets:  Communication Skills Desire for Improvement Resilience  Sleep:  Number of Hours: 6.25  Cognition: WNL  ADL's:  Intact   Have you used any form of tobacco in the last 30 days? (Cigarettes, Smokeless Tobacco, Cigars, and/or Pipes): No  Has this patient used any form of tobacco in the last 30 days? (Cigarettes, Smokeless Tobacco, Cigars, and/or Pipes) No  Mental Status Per Nursing Assessment::   On Admission:     Current Mental Status by Physician:  Patient presents significantly improved compared to admission - she   Presents with improved mood, improved range of affect, no thought disorder, at this time denies any SI, denies any HI, denies any hallucinations, no delusions. She is 0x3. She is more future oriented, and states she is looking forward to going to a horse show later this week and states she is giving serious thought to training to become a horse show judge.  Loss Factors: Quadriparesia, mother ( with whom she lives ) has Parkinson's Disease   Historical Factors: History of chronic and severe depression, history of serious suicide attempts in the past, prior psychiatric admissions  Risk Reduction Factors:   Sense of responsibility to family  Continued Clinical Symptoms:  As noted, at this time patient significantly improved compared to admission, and not currently presenting with any SI. She is more future oriented and not as ruminative as she had been . Not feeling hopeless and seems more optimistic at present.  Cognitive Features That Contribute To Risk:  No gross cognitive deficits noted upon discharge. Is alert , attentive, and oriented x 3   Suicide Risk:  Mild:  Suicidal ideation of limited frequency, intensity, duration, and specificity.  There are no identifiable plans, no associated intent, mild dysphoria and related symptoms, good self-control (both objective and subjective assessment), few other risk factors, and identifiable protective factors, including available and accessible social support.  Principal Problem: MDD (major depressive disorder), recurrent severe, without psychosis [facility](HCC) Discharge Diagnoses:  Patient Active Problem List   Diagnosis Date Noted  . MDD (major depressive disorder), recurrent severe, without psychosis (  HCC) [F33.2] 09/11/2015  . Major depressive disorder, recurrent, severe without psychotic features (HCC) [F33.2]   . Severe recurrent major depression without psychotic features (HCC) [F33.2] 06/19/2015    Follow-up Information    Follow up with  [facility] On 10/06/2015.   Specialty:  Behavioral Health   Why:  at 8:15 for therapy with Mikel Cella information:   [address] [place] [address] [phone]      Follow up with [facility] On 10/23/2015.   Specialty:  Behavioral Health   Why:  at 8:30am for medication management with Dr. Lolly Mustache.   Contact information:   [address] [place] [address] [phone]      Follow up with [facility] On 10/01/2015.   Why:  at 11:00am with Terie Purser for medication management/hospital discharge appointment. Please arrive at 10:30am   Contact information:   [address] [place], [place] [address] Phone: [phone]  Fax: [phone]       Follow up with [facility] On 09/30/2015.   Why:  for your maintenance ECT appointment. Expect to be there at 8am; staff will call you with your specific procedure time   Contact information:   [address] [place], [place] [address]  [phone] Fax: [phone]      Plan Of Care/Follow-up recommendations:  Activity:  as tolerated Diet:  Regular Tests:  NA Other:  see below  Is patient on multiple antipsychotic therapies at discharge:  No   Has Patient had three or more failed trials of antipsychotic monotherapy by history:  No  Recommended Plan for Multiple Antipsychotic Therapies: NA Patient is leaving unit in good spirits. Plans to return home- lives with mother Plans to follow up as above, to include continuing maintenance ECT , which has been effective and well tolerated in the past .   Natasha [name] 09/25/2015, 2:20 PM

## 2015-09-25 NOTE — Progress Notes (Signed)
D: Patient alert and oriented x4. Patient denies SI/HI/AVH. Patient states "That's really big for me," in regards to not having suicidal thoughts.  Per patient inventory, depression rated as 2 hopelessness as 3, and anxiety as 10. Patient affect appeared anxious and sad. Patient mood is pleasant and cooperative.  A: Provide active listening and support. Discussed patient status with MD and treatment team. Administered scheduled medications. Gave vistaril PRN for anxiety.  R: Patient reports the vistaril "took the edge off the anxiety", but patient has a baseline state of anxiety. Will continue Q15 min. checks.

## 2015-09-25 NOTE — Progress Notes (Signed)
   09/25/15 1500  Clinical Encounter Type  Visited With Patient  Visit Type Follow-up;Psychological support;Spiritual support;Behavioral Health  Consult/Referral To Physician  Stress Factors  Patient Stress Factors Major life changes;Health changes;Lack of caregivers;Exhausted;Family relationships    Follow up with Dareen providing support around discharge planning.  Clio relates feeling nervous about leaving hospital today.  She is hopeful to attend an equestrian even this weekend and spoke with chaplain about finding moments of rest and rejuvanation at this event.  Alyzabeth communicated need for "hope and resiliency" as she leaves hospital today.  Spoke with chaplain about ways she can carry these with her.  She is comfortable with her discharge plans and stated "I feel like we have a handle on the depression this time"

## 2015-09-26 ENCOUNTER — Telehealth: Payer: Self-pay | Admitting: Registered Nurse

## 2015-09-30 DIAGNOSIS — F332 Major depressive disorder, recurrent severe without psychotic features: Secondary | ICD-10-CM | POA: Diagnosis not present

## 2015-10-01 ENCOUNTER — Ambulatory Visit (HOSPITAL_COMMUNITY)
Admission: RE | Admit: 2015-10-01 | Discharge: 2015-10-01 | Disposition: A | Discharge status: Home / Self Care | Payer: Medicare Other | Attending: Psychiatry | Admitting: Psychiatry

## 2015-10-01 ENCOUNTER — Emergency Department (HOSPITAL_COMMUNITY)
Admission: EM | Admit: 2015-10-01 | Discharge: 2015-10-02 | Disposition: A | Discharge status: Home / Self Care | Payer: Medicare Other | Attending: Emergency Medicine | Admitting: Emergency Medicine

## 2015-10-01 ENCOUNTER — Encounter (HOSPITAL_COMMUNITY): Payer: Self-pay

## 2015-10-01 DIAGNOSIS — Z79899 Other long term (current) drug therapy: Secondary | ICD-10-CM | POA: Diagnosis not present

## 2015-10-01 DIAGNOSIS — F1310 Sedative, hypnotic or anxiolytic abuse, uncomplicated: Secondary | ICD-10-CM | POA: Insufficient documentation

## 2015-10-01 DIAGNOSIS — Z811 Family history of alcohol abuse and dependence: Secondary | ICD-10-CM | POA: Diagnosis not present

## 2015-10-01 DIAGNOSIS — Z6824 Body mass index (BMI) 24.0-24.9, adult: Secondary | ICD-10-CM | POA: Diagnosis not present

## 2015-10-01 DIAGNOSIS — F332 Major depressive disorder, recurrent severe without psychotic features: Secondary | ICD-10-CM | POA: Insufficient documentation

## 2015-10-01 DIAGNOSIS — Z87828 Personal history of other (healed) physical injury and trauma: Secondary | ICD-10-CM | POA: Diagnosis not present

## 2015-10-01 DIAGNOSIS — R45851 Suicidal ideations: Secondary | ICD-10-CM | POA: Diagnosis not present

## 2015-10-01 DIAGNOSIS — F329 Major depressive disorder, single episode, unspecified: Secondary | ICD-10-CM | POA: Diagnosis not present

## 2015-10-01 DIAGNOSIS — F32A Depression, unspecified: Secondary | ICD-10-CM

## 2015-10-01 DIAGNOSIS — Z1389 Encounter for screening for other disorder: Secondary | ICD-10-CM | POA: Diagnosis not present

## 2015-10-01 LAB — CBC
HCT: 40.3 % (ref 36.0–46.0)
Hemoglobin: 13.4 g/dL (ref 12.0–15.0)
MCH: 28.3 pg (ref 26.0–34.0)
MCHC: 33.3 g/dL (ref 30.0–36.0)
MCV: 85.2 fL (ref 78.0–100.0)
Platelets: 346 10*3/uL (ref 150–400)
RBC: 4.73 MIL/uL (ref 3.87–5.11)
RDW: 14.3 % (ref 11.5–15.5)
WBC: 9.2 10*3/uL (ref 4.0–10.5)

## 2015-10-01 LAB — RAPID URINE DRUG SCREEN, HOSP PERFORMED
Amphetamines: NOT DETECTED
Barbiturates: NOT DETECTED
Benzodiazepines: POSITIVE — AB
Cocaine: NOT DETECTED
Opiates: NOT DETECTED
Tetrahydrocannabinol: NOT DETECTED

## 2015-10-01 LAB — COMPREHENSIVE METABOLIC PANEL
ALT: 21 U/L (ref 14–54)
AST: 21 U/L (ref 15–41)
Albumin: 4.4 g/dL (ref 3.5–5.0)
Alkaline Phosphatase: 61 U/L (ref 38–126)
Anion gap: 8 (ref 5–15)
BUN: 10 mg/dL (ref 6–20)
CO2: 21 mmol/L — ABNORMAL LOW (ref 22–32)
Calcium: 9.2 mg/dL (ref 8.9–10.3)
Chloride: 109 mmol/L (ref 101–111)
Creatinine, Ser: 0.71 mg/dL (ref 0.44–1.00)
GFR calc Af Amer: >60 mL/min (ref >60)
GFR calc non Af Amer: >60 mL/min (ref >60)
Glucose, Bld: 89 mg/dL (ref 65–99)
Potassium: 4 mmol/L (ref 3.5–5.1)
Sodium: 138 mmol/L (ref 135–145)
Total Bilirubin: 0.6 mg/dL (ref 0.3–1.2)
Total Protein: 7.5 g/dL (ref 6.5–8.1)

## 2015-10-01 LAB — ETHANOL: Alcohol, Ethyl (B): <5 mg/dL (ref <5)

## 2015-10-01 LAB — LITHIUM LEVEL: Lithium Lvl: <0.06 mmol/L — ABNORMAL LOW (ref 0.60–1.20)

## 2015-10-01 LAB — ACETAMINOPHEN LEVEL: Acetaminophen (Tylenol), Serum: <10 ug/mL — ABNORMAL LOW (ref 10–30)

## 2015-10-01 LAB — SALICYLATE LEVEL: Salicylate Lvl: <4 mg/dL (ref 2.8–30.0)

## 2015-10-01 MED ORDER — LITHIUM CARBONATE 150 MG PO CAPS
150.0000 mg | ORAL_CAPSULE | Freq: Three times a day (TID) | ORAL | Status: DC
  Administered 2015-10-02 (×2): 150 mg via ORAL
  Filled 2015-10-01 (×2): qty 1

## 2015-10-01 MED ORDER — OLANZAPINE 10 MG PO TABS
10.0000 mg | ORAL_TABLET | Freq: Every day | ORAL | Status: DC
  Administered 2015-10-01: 10 mg via ORAL
  Filled 2015-10-01: qty 1

## 2015-10-01 MED ORDER — CLONAZEPAM 0.5 MG PO TABS
2.0000 mg | ORAL_TABLET | Freq: Every day | ORAL | Status: DC
  Administered 2015-10-01: 2 mg via ORAL
  Filled 2015-10-01: qty 4

## 2015-10-01 MED ORDER — OXYMETAZOLINE HCL 0.05 % NA SOLN: 1.0000 | Freq: Two times a day (BID) | NASAL | Status: DC | PRN

## 2015-10-01 MED ORDER — BUPROPION HCL ER (XL) 150 MG PO TB24
450.0000 mg | ORAL_TABLET | Freq: Every day | ORAL | Status: DC
  Administered 2015-10-01 – 2015-10-02 (×2): 450 mg via ORAL
  Filled 2015-10-01 (×2): qty 1

## 2015-10-01 NOTE — ED Notes (Addendum)
Pt belongings in locker #26. Pt sates no cell phone neither wallet are in her bags. Pt brought only clothes with her. There one plastic bag and one sac.

## 2015-10-01 NOTE — ED Notes (Signed)
TTS at bedside. 
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
## 2015-10-01 NOTE — ED Provider Notes (Signed)
CSN: [id number]     Arrival date & time 10/01/15  1742 History   First MD Initiated Contact with Patient 10/01/15 2001     Chief Complaint  Patient presents with  . Medical Clearance  . Suicidal   HPI Patient presents to the emergency room for evaluation of depression and suicidal ideation. The patient has chronic issues with depression. She has been treated with medications as well as ECT in the past. Patient states her symptoms having getting worse over the last several days. She has not had her medications for a few days. She thinks this may be the trigger. She saw her primary doctor today and when she discussed her symptoms patient did not feel like she could contract for safety. She was told to come to the emergency room.  Past Medical History  Diagnosis Date  . C6 spinal cord injury (HCC)   . Depression    Past Surgical History  Procedure Laterality Date  . Back surgery     Family History  Problem Relation Age of Onset  . Alcohol abuse Father   . Alcohol abuse Brother    Social History  Substance Use Topics  . Smoking status: Never Smoker   . Smokeless tobacco: Never Used     Comment: No smoking hx; no need for cessation materials  . Alcohol Use: Yes     Comment: One drink a week at most; rarely drinks   OB History    No data available     Review of Systems  All other systems reviewed and are negative.     Allergies  Review of patient's allergies indicates no known allergies.  Home Medications   Prior to Admission medications   Medication Sig Start Date End Date Taking? Authorizing Provider  buPROPion 450 MG TB24 Take 450 mg by mouth daily. 09/25/15  Yes Beau Fanny, FNP  clonazePAM (KLONOPIN) 2 MG tablet Take 1 tablet (2 mg total) by mouth at bedtime. 09/25/15  Yes Beau Fanny, FNP  lithium carbonate 150 MG capsule Take 1 capsule (150 mg total) by mouth 3 (three) times daily with meals. 09/25/15  Yes Beau Fanny, FNP  OLANZapine (ZYPREXA) 10 MG  tablet Take 1 tablet (10 mg total) by mouth at bedtime. 09/25/15  Yes Beau Fanny, FNP  oxymetazoline (AFRIN) 0.05 % nasal spray Place 1 spray into both nostrils 2 (two) times daily as needed for congestion.   Yes Historical Provider, MD  SUMAtriptan (IMITREX) 25 MG tablet Take 1 tablet (25 mg total) by mouth every 2 (two) hours as needed for migraine or headache. May repeat in 2 hours if headache persists or recurs. 09/25/15  Yes Beau Fanny, FNP  hydrOXYzine (ATARAX/VISTARIL) 50 MG tablet Take 1 tablet (50 mg total) by mouth 3 (three) times daily as needed for anxiety. Patient not taking: Reported on 10/01/2015 09/25/15   Beau Fanny, FNP  methocarbamol (ROBAXIN) 750 MG tablet Take 1 tablet (750 mg total) by mouth every 6 (six) hours as needed for muscle spasms. Patient not taking: Reported on 10/01/2015 09/25/15   Beau Fanny, FNP   BP 144/98 mmHg  Pulse 80  Temp(Src) 98.6 F (37 C) (Oral)  Resp 18  SpO2 100%  LMP 09/07/2015 Physical Exam  Constitutional: She appears well-developed and well-nourished. No distress.  HENT:  Head: Normocephalic and atraumatic.  Right Ear: External ear normal.  Left Ear: External ear normal.  Eyes: Conjunctivae are normal. Right eye exhibits no discharge. Left  eye exhibits no discharge. No scleral icterus.  Neck: Neck supple. No tracheal deviation present.  Cardiovascular: Normal rate, regular rhythm and intact distal pulses.   Pulmonary/Chest: Effort normal and breath sounds normal. No stridor. No respiratory distress. She has no wheezes. She has no rales.  Abdominal: Soft. Bowel sounds are normal. She exhibits no distension. There is no tenderness. There is no rebound and no guarding.  Musculoskeletal: She exhibits no edema or tenderness.  Neurological: She is alert. She has normal strength. No cranial nerve deficit (no facial droop, extraocular movements intact, no slurred speech) or sensory deficit. She exhibits normal muscle tone. She  displays no seizure activity.  Skin: Skin is warm and dry. No rash noted.  Psychiatric: She is slowed. She exhibits a depressed mood. She expresses suicidal ideation.  Nursing note and vitals reviewed.   ED Course  Procedures (including critical care time) Labs Review Labs Reviewed  COMPREHENSIVE METABOLIC PANEL - Abnormal; Notable for the following:    CO2 21 (*)    All other components within normal limits  ACETAMINOPHEN LEVEL - Abnormal; Notable for the following:    Acetaminophen (Tylenol), Serum <10 (*)    All other components within normal limits  URINE RAPID DRUG SCREEN, HOSP PERFORMED - Abnormal; Notable for the following:    Benzodiazepines POSITIVE (*)    All other components within normal limits  ETHANOL  SALICYLATE LEVEL  CBC     I have personally reviewed and evaluated these lab results as part of my medical decision-making.   MDM    patient's laboratory tests are normal. She is medically stable for psychiatric evaluation. I will consult with the psychiatry team for her trouble with depression and suicidal ideation   Linwood DibblesJon Azion Centrella, MD 10/01/15 2024

## 2015-10-01 NOTE — BHH Counselor (Signed)
Pt is concerned about placement. Pt does not want to be placed [facility]. [facility]  Natasha PhoenixBrandi Margeaux [name], [facility] Triage Specialist

## 2015-10-01 NOTE — ED Notes (Signed)
Patient is requesting medical clearance and states she is suicidal. Patient states she has several suicide plans, Patient stated,"they sound extreme, like bleeding out from my wrist or neck, various poisoning techniques" The thought of suicide never goes away." Patient denies any auditory or visual hallucinations. Patient denies any alcohol or drug use.

## 2015-10-01 NOTE — ED Notes (Signed)
Delay in lab draw, pt changing into scrubs 
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
## 2015-10-02 ENCOUNTER — Encounter (HOSPITAL_COMMUNITY): Payer: Self-pay | Admitting: Registered Nurse

## 2015-10-02 DIAGNOSIS — F329 Major depressive disorder, single episode, unspecified: Secondary | ICD-10-CM

## 2015-10-02 NOTE — Discharge Instructions (Addendum)
For your ongoing behavioral health needs, you are advised to keep your existing appointments at [facility]:  Kathryne SharperSyed Arfeen, MD Thursday, 10/23/2015 at 9:00am (plan to be at the office at 8:00 am for your first visit only) You have also been placed on Dr. Sheela StackArfeen's cancellation list.  If an appointment becomes available sooner, you will receive a call from the office.  Radene KneeFrances Powell, LCSW Tuesday, 11/04/2015 at 1:30 pm - PLEASE NOTE THAT THIS IS A RESCHEDULED APPOINTMENT!       [facility]      [address]      [place], [place] [address]      [phone]

## 2015-10-02 NOTE — BH Assessment (Addendum)
BHH Assessment Progress Note  Per Thedore MinsMojeed Akintayo, MD, this pt does not require psychiatric hospitalization at this time.  [facility] [place].  These include an appointment with Radene KneeFrances Powell, LCSW on Monday, 10/06/2015 at 09:00 and an appointment with Kathryne SharperSyed Arfeen, MD on Thursday, 10/23/2015 at 09:00.  Pt is advised to present at the clinic at 08:00 for her appointment with Scarlette CalicoFrances, which will be an intake appointment.  She has also been placed on the cancellation list for her appointment with Dr Lolly MustacheArfeen, and will be called if an appointment with him becomes available sooner.  This information has been included in pt's discharge instructions.  Pt's nurse has been notified.  Doylene Canninghomas Lakeem Rozo, MA Triage Specialist [phone]   Addendum:  The Outpatient Clinic calls me back to report that pt's appointment with Scarlette CalicoFrances has been rescheduled for Tuesday, 11/04/2015 at 13:30.  Since the visit with Dr Lolly MustacheArfeen will now be pt's initial visit with the practice, pt is advised to present an hour early for pt's appointment with him.  Pt's discharge instructions have been updated accordingly, and pt's nurse has been notified.  Doylene Canninghomas Milam Allbaugh, MA Triage Specialist [phone]

## 2015-10-02 NOTE — BHH Counselor (Signed)
10/02/15 Referral packet sent to [place], [facility], [place] [place], [place], [place], [place], [place], [place], [place], [place], [place], [place], [place], and [facility]  Tajai Suder K. Shadai Mcclane,LCASA, LPCA, NCC  Counselor 10/02/2015 5:36 AM
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
## 2015-10-02 NOTE — BHH Suicide Risk Assessment (Cosign Needed)
Suicide Risk Assessment  Discharge Assessment   [facility]   Demographic Factors:  Caucasian  Total Time spent with patient: 20 minutes  Psychiatric Specialty Exam: See Consult Note     Blood pressure 110/93, pulse 87, temperature 98.4 F (36.9 C), temperature source Oral, resp. rate 18, last menstrual period 09/07/2015, SpO2 100 %.There is no weight on file to calculate BMI.     Has this patient used any form of tobacco in the last 30 days? (Cigarettes, Smokeless Tobacco, Cigars, and/or Pipes) No  Mental Status Per Nursing Assessment::   On Admission:     Current Mental Status by Physician: Denies hallucinations, delusions, and paranoia  Loss Factors: NA  Historical Factors: History of psychiatric illness  Risk Reduction Factors:   Sense of responsibility to family, Positive social support and Positive coping skills or problem solving skills  Continued Clinical Symptoms:  Previous Psychiatric Diagnoses and Treatments  Cognitive Features That Contribute To Risk:  Polarized thinking    Suicide Risk:  Minimal: No identifiable suicidal ideation.  Patients presenting with no risk factors but with morbid ruminations; may be classified as minimal risk based on the severity of the depressive symptoms  Principal Problem: MDD (major depressive disorder) [facility](HCC) Discharge Diagnoses:  Patient Active Problem List   Diagnosis Date Noted  . MDD (major depressive disorder) (HCC) [F32.9] 10/02/2015  . MDD (major depressive disorder), recurrent severe, without psychosis (HCC) [F33.2] 09/11/2015  . Major depressive disorder, recurrent, severe without psychotic features ([facility]) [F33.2]   . Severe recurrent major depression without psychotic features ([facility]) [F33.2] 06/19/2015      Plan Of Care/Follow-up recommendations:  Activity:  As tolerated Diet:  As tolerated Other:  Follow up With [facility]  Is patient on multiple antipsychotic therapies  at discharge:  No   Has Patient had three or more failed trials of antipsychotic monotherapy by history:  No  Recommended Plan for Multiple Antipsychotic Therapies: NA    Rankin, Shuvon, FNP-BC 10/02/2015, 2:06 PM

## 2015-10-06 ENCOUNTER — Ambulatory Visit (HOSPITAL_COMMUNITY): Payer: Self-pay | Admitting: Clinical

## 2015-10-12 NOTE — Telephone Encounter (Signed)
No refill

## 2015-10-21 DIAGNOSIS — F332 Major depressive disorder, recurrent severe without psychotic features: Secondary | ICD-10-CM | POA: Diagnosis not present

## 2015-10-23 ENCOUNTER — Encounter (HOSPITAL_COMMUNITY): Payer: Self-pay | Admitting: Psychiatry

## 2015-10-23 ENCOUNTER — Ambulatory Visit (INDEPENDENT_AMBULATORY_CARE_PROVIDER_SITE_OTHER): Payer: Medicare Other | Admitting: Psychiatry

## 2015-10-23 VITALS — BP 112/70 | HR 98 | Ht 60.0 in | Wt 135.0 lb

## 2015-10-23 DIAGNOSIS — F331 Major depressive disorder, recurrent, moderate: Secondary | ICD-10-CM | POA: Diagnosis not present

## 2015-10-23 DIAGNOSIS — Z79899 Other long term (current) drug therapy: Secondary | ICD-10-CM

## 2015-10-23 MED ORDER — LITHIUM CARBONATE 300 MG PO CAPS: 300.0000 mg | ORAL_CAPSULE | Freq: Two times a day (BID) | ORAL | Status: DC

## 2015-10-23 MED ORDER — BUPROPION HCL ER (XL) 300 MG PO TB24: 300.0000 mg | ORAL_TABLET | Freq: Every day | ORAL | Status: DC

## 2015-10-23 MED ORDER — OLANZAPINE 10 MG PO TABS: 10.0000 mg | ORAL_TABLET | Freq: Every day | ORAL | Status: DC
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
## 2015-10-31 DIAGNOSIS — Z79899 Other long term (current) drug therapy: Secondary | ICD-10-CM | POA: Diagnosis not present

## 2015-10-31 LAB — HEMOGLOBIN A1C
Hgb A1c MFr Bld: 5.4 % (ref <5.7)
Mean Plasma Glucose: 108 mg/dL (ref <117)

## 2015-11-01 LAB — LITHIUM LEVEL: Lithium Lvl: 0.4 mEq/L — ABNORMAL LOW (ref 0.80–1.40)

## 2015-11-04 ENCOUNTER — Encounter (HOSPITAL_COMMUNITY): Payer: Self-pay | Admitting: Clinical

## 2015-11-04 ENCOUNTER — Ambulatory Visit (INDEPENDENT_AMBULATORY_CARE_PROVIDER_SITE_OTHER): Payer: Medicare Other | Admitting: Clinical

## 2015-11-04 DIAGNOSIS — F4310 Post-traumatic stress disorder, unspecified: Secondary | ICD-10-CM | POA: Diagnosis not present

## 2015-11-04 DIAGNOSIS — F332 Major depressive disorder, recurrent severe without psychotic features: Secondary | ICD-10-CM
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
## 2015-11-11 DIAGNOSIS — F332 Major depressive disorder, recurrent severe without psychotic features: Secondary | ICD-10-CM | POA: Diagnosis not present

## 2015-11-11 DIAGNOSIS — F329 Major depressive disorder, single episode, unspecified: Secondary | ICD-10-CM | POA: Diagnosis not present
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
## 2015-11-25 ENCOUNTER — Other Ambulatory Visit (HOSPITAL_COMMUNITY): Payer: Self-pay | Admitting: Psychiatry

## 2015-11-28 NOTE — Telephone Encounter (Signed)
Met with Dr. Adele Schilder who approved a one time refill of patient's Olanzapine 80m, one at bedtime until patient returns for evaluation on 12/17/15.  [facility] order [facility] [address].

## 2015-12-01 DIAGNOSIS — F332 Major depressive disorder, recurrent severe without psychotic features: Secondary | ICD-10-CM | POA: Diagnosis not present

## 2015-12-03 ENCOUNTER — Ambulatory Visit (INDEPENDENT_AMBULATORY_CARE_PROVIDER_SITE_OTHER): Payer: Medicare Other | Admitting: Clinical

## 2015-12-03 DIAGNOSIS — F4310 Post-traumatic stress disorder, unspecified: Secondary | ICD-10-CM

## 2015-12-03 DIAGNOSIS — F332 Major depressive disorder, recurrent severe without psychotic features: Secondary | ICD-10-CM

## 2015-12-08 ENCOUNTER — Encounter (HOSPITAL_COMMUNITY): Payer: Self-pay | Admitting: Clinical

## 2015-12-08 NOTE — Progress Notes (Signed)
   THERAPIST PROGRESS NOTE  Session Time: 3:30 -4:28  Participation Level: Active  Behavioral Response: CasualAlertDepressed  Type of Therapy: Individual Therapy  Treatment Goals addressed: improve psychiatric symptoms, improve unhelpful thought patterns  Interventions: CBT and Motivational Interviewing grounding technique   Summary: Natasha [name] is a 44 y.o. female who presents with Major depressive disorder, recurrent, severe without psychotic features, and PTSD.    Suicidal/Homicidal: Nowithout intent/plan  Therapist Response: Natasha [name] met with clinician for an individual session. Natasha [name] discussed her psychiatric symptoms, her current life event, and her goals for therapy. Natasha [name] shared that she was feeling okay today which is a bit new for her. She shared that the ECT she is doing has helped a little. Client and clinician discussed her desire to feel better. She shared that one of the things that is most difficult for her to deal with is the fact that she has caused such damage to her body that she feels like everything is a struggle and she is a burden to her mother who is her main support. Natasha [name] shared some of her thoughts and emotions. Clinician validated her feelings. Clinician introduced some basic concepts of cbt. Client and clinician discussed what might be different if she thought about her situation differently. Natasha [name] and clinician began by discussing a topic that was not too emotional charge. Natasha [name] shared that it takes her an hour to put on her socks and that while doing so she has a lot of negative thoughts and emotions about her situation. Client and clinician discussed what would be different if she just expected it to take an hour and used the time to listen to good music, play a ted talk, or a variety of other things she might enjoy. Natasha [name] shared that she hadn't considered thinking about the chore another way. Client and clinician discussed how our thoughts affect  our emotions and our experiences. Client and clinician began a discussion about challenging unhealthy thoughts. Client and clinician agreed to discuss it further at a future session. Clinician introduced a grounding technique.Clinician explained the process, purpose, and application of the technique. Natasha [name] and clinician practiced the technique together. Natasha [name] agreed to practice daily until next session.  Plan: Return again in 1 -2weeks.  Diagnosis: Axis I: Major depressive disorder, recurrent, severe without psychotic features, and PTSD     Natasha [name] A, LCSW 12/03/2015

## 2015-12-11 ENCOUNTER — Other Ambulatory Visit (HOSPITAL_COMMUNITY): Payer: Self-pay | Admitting: Psychiatry

## 2015-12-11 DIAGNOSIS — F331 Major depressive disorder, recurrent, moderate: Secondary | ICD-10-CM

## 2015-12-11 NOTE — Telephone Encounter (Signed)
Met with Dr. Salem Senate, helping to cover for Dr. Adele Schilder out this week, who approved a one time refill of patient's requested Lithium until patient returns to see Dr. Adele Schilder on 12/17/15.  A new one time order for patient's prescribed Lithium Carbonate e-scribed to patient's [facility] on [address] in [place].  Patient to return on 12/17/15 for next evaluation with Dr. Adele Schilder.

## 2015-12-16 ENCOUNTER — Ambulatory Visit (INDEPENDENT_AMBULATORY_CARE_PROVIDER_SITE_OTHER): Payer: Medicare Other | Admitting: Clinical

## 2015-12-16 ENCOUNTER — Encounter (HOSPITAL_COMMUNITY): Payer: Self-pay | Admitting: Clinical

## 2015-12-16 DIAGNOSIS — F4310 Post-traumatic stress disorder, unspecified: Secondary | ICD-10-CM | POA: Diagnosis not present

## 2015-12-16 DIAGNOSIS — F332 Major depressive disorder, recurrent severe without psychotic features: Secondary | ICD-10-CM | POA: Diagnosis not present

## 2015-12-16 NOTE — Progress Notes (Addendum)
   THERAPIST PROGRESS NOTE  Session Time: 1:30 -2:30  Participation Level: Active  Behavioral Response: CasualAlertDepressed  Type of Therapy: Individual Therapy  Treatment Goals addressed: improve psychiatric symptoms, elevate mood (increase hopefulness, decrease self-harming behavior), improve unhelpful thought patterns,   Interventions: CBT and Motivational Interviewing, Grounding and Mindfulness Techniques  Summary: Natasha [name] is a 45 y.o. female who presents with Major Depressive Disorder, recurrent, severe without psychotic features and PTSD   Suicidal/Homicidal: No -without intent/plan  Therapist Response:  Natasha [name] met with clinician for an individual session. Natasha [name] discussed her psychiatric symptoms, her current life events, and her goals for therapy.  Natasha [name] shared that she was doing "okay" today. She shared that she did have some rough days this past week where she self harmed. She stated that she cut herself but not deep. She denied any suicidal or homicidal ideation. Natasha [name] and clinician discussed the events and thoughts that led to her self harming behavior. Natasha [name] shared that she was feeling really good, and then her internal dialog starts to tell her that she shouldn't feel good and then her thoughts grow increasingly negative and she feels as if she should punish herself.  Natasha [name] and clinician dicussed the use of grounding techniques to interrupt these thoughts.She shared she had practiced these and had been successful at sometimes. Natasha [name] and clinician discussed how to improve her practice. Clinician introduced some  Basic CBT concepts. Clinician introduced a 7 panel thought record sheet. Natasha [name] clinician used her example to complete the worksheet. Natasha [name] and clinician discussed her emotions her negative automatic thoughts, the evidence for and against the thoughts, and some healthier alternative thoughts. Clanging clinician discussed how our thoughts and perform our  emotions. Natasha [name] and clinician discussed ways to challenge those negative automatic thoughts. Natasha [name] and clinician discussed the self harming behavior and alternatives to dealing with negative thoughts. Clinician discussed some mindfulness techniques which included focusing on something she loves - horses. Natasha [name] agreed to continue to practice her grounding techniques and to practice some basic CBT techniques for homework. Natasha [name] denied any current suicidal or homicidal ideation.  Plan: Return again in 1 weeks.  Diagnosis: Axis I: Major Depressive Disorder, recurrent, severe without psychotic features and PTSD     Sejla Marzano A, LCSW 12/16/2015

## 2015-12-17 ENCOUNTER — Encounter (HOSPITAL_COMMUNITY): Payer: Self-pay | Admitting: Psychiatry

## 2015-12-17 ENCOUNTER — Ambulatory Visit (INDEPENDENT_AMBULATORY_CARE_PROVIDER_SITE_OTHER): Payer: Medicare Other | Admitting: Psychiatry

## 2015-12-17 VITALS — BP 124/86 | HR 85 | Ht 60.0 in | Wt 140.4 lb

## 2015-12-17 DIAGNOSIS — F331 Major depressive disorder, recurrent, moderate: Secondary | ICD-10-CM

## 2015-12-17 MED ORDER — LITHIUM CARBONATE 300 MG PO CAPS: ORAL_CAPSULE | ORAL | Status: DC

## 2015-12-17 MED ORDER — CLONAZEPAM 2 MG PO TABS: 2.0000 mg | ORAL_TABLET | Freq: Every day | ORAL | Status: DC

## 2015-12-17 MED ORDER — OLANZAPINE 10 MG PO TABS: ORAL_TABLET | ORAL | Status: DC

## 2015-12-17 MED ORDER — BUPROPION HCL ER (XL) 300 MG PO TB24: 300.0000 mg | ORAL_TABLET | Freq: Every day | ORAL | Status: DC
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
## 2015-12-30 ENCOUNTER — Ambulatory Visit (INDEPENDENT_AMBULATORY_CARE_PROVIDER_SITE_OTHER): Payer: Medicare Other | Admitting: Clinical

## 2015-12-30 ENCOUNTER — Encounter (HOSPITAL_COMMUNITY): Payer: Self-pay | Admitting: Clinical

## 2015-12-30 DIAGNOSIS — F331 Major depressive disorder, recurrent, moderate: Secondary | ICD-10-CM | POA: Diagnosis not present

## 2015-12-30 DIAGNOSIS — F4310 Post-traumatic stress disorder, unspecified: Secondary | ICD-10-CM

## 2015-12-30 NOTE — Progress Notes (Signed)
   THERAPIST PROGRESS NOTE  Session Time: 1:30 -2:30  Participation Level: Active  Behavioral Response: CasualAlertDepressed and  alternatively hopeful  Type of Therapy: Individual Therapy  Treatment Goals addressed: improve psychiatric symptoms, elevate mood 9improve self-esteem, increase hopefulness, decrease suicidal thoughts, decrease self-harming behavior), improve unhelpful thought patterns, decrease irrational worries, discuss and process trauma  Interventions: CBT and Motivational Interviewing, Grounding and Mindfulness Techniques  Summary: Natasha [name] is a 45 y.o. female who presents with Major Depressive Disorder, recurrent, severe without psychotic features and PTSD   Suicidal/Homicidal: No -without intent/plan  Therapist Response:  Altair met with clinician for an individual session. [name] discussed her psychiatric symptoms, her current life events, and her homework. [name] shared that she was doing okay today though she had some difficult days. She shared that she had completed some 7 panel thought record sheets. One of the sheets she shared that she self harmed 1 time by banging her head. Client and clinician discussed the trigger situation. Her emotions, her negative automatic thoughts, the evidence for and against the thoughts and healthier alternative thoughts. [name] shared that it was difficult for her to be kind to herself because of a belief that she shouldn't be easy on herself and that things are suppose to be difficult or you don't value them. Client and clinician discussed this belief. Clinician asked if she ever valued anything that was easy and felt good. She shared that her friendship with [name] was this way. [name] discussed her friendship and cried some ([name] has passed away). [name] shared some of her sadness about [name] being gone. [name] also shared some of the the things that she enjoyed about the friendship. Client and clinician revisited the  belief about ease. [name] shared that her friend would have laughed at her negative belief. [name] and clinician discussed some of her other negative automatic beliefs and [name] formulated alternative ones. She shared that she didn't know if she was ready to believe them but that it was new for her to consider another option. [name] agreed to continue her homework until next session.   She denied any suicidal or homicidal ideation.   Plan: Return again in 1 weeks.  Diagnosis: Axis I: Major Depressive Disorder, recurrent, severe without psychotic features and PTSD   [name] A, LCSW 12/30/2015

## 2016-01-09 DIAGNOSIS — F332 Major depressive disorder, recurrent severe without psychotic features: Secondary | ICD-10-CM | POA: Diagnosis not present

## 2016-01-13 ENCOUNTER — Ambulatory Visit (HOSPITAL_COMMUNITY): Payer: Self-pay | Admitting: Clinical

## 2016-01-27 ENCOUNTER — Encounter (HOSPITAL_COMMUNITY): Payer: Self-pay | Admitting: Clinical

## 2016-01-27 ENCOUNTER — Ambulatory Visit (INDEPENDENT_AMBULATORY_CARE_PROVIDER_SITE_OTHER): Payer: Medicare Other | Admitting: Clinical

## 2016-01-27 DIAGNOSIS — F4310 Post-traumatic stress disorder, unspecified: Secondary | ICD-10-CM

## 2016-01-27 DIAGNOSIS — F332 Major depressive disorder, recurrent severe without psychotic features: Secondary | ICD-10-CM
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
## 2016-02-11 DIAGNOSIS — F332 Major depressive disorder, recurrent severe without psychotic features: Secondary | ICD-10-CM | POA: Diagnosis not present

## 2016-02-12 DIAGNOSIS — E663 Overweight: Secondary | ICD-10-CM | POA: Diagnosis not present

## 2016-02-12 DIAGNOSIS — F332 Major depressive disorder, recurrent severe without psychotic features: Secondary | ICD-10-CM | POA: Diagnosis not present

## 2016-02-12 DIAGNOSIS — R252 Cramp and spasm: Secondary | ICD-10-CM | POA: Diagnosis not present

## 2016-02-12 DIAGNOSIS — F329 Major depressive disorder, single episode, unspecified: Secondary | ICD-10-CM | POA: Diagnosis not present

## 2016-02-12 DIAGNOSIS — M6250 Muscle wasting and atrophy, not elsewhere classified, unspecified site: Secondary | ICD-10-CM | POA: Diagnosis not present

## 2016-02-12 DIAGNOSIS — G8250 Quadriplegia, unspecified: Secondary | ICD-10-CM | POA: Diagnosis not present

## 2016-02-12 DIAGNOSIS — Z6826 Body mass index (BMI) 26.0-26.9, adult: Secondary | ICD-10-CM | POA: Diagnosis not present

## 2016-02-12 DIAGNOSIS — Z1389 Encounter for screening for other disorder: Secondary | ICD-10-CM | POA: Diagnosis not present

## 2016-02-16 ENCOUNTER — Encounter (HOSPITAL_COMMUNITY): Payer: Self-pay | Admitting: Psychiatry

## 2016-02-16 ENCOUNTER — Ambulatory Visit (INDEPENDENT_AMBULATORY_CARE_PROVIDER_SITE_OTHER): Payer: Medicare Other | Admitting: Psychiatry

## 2016-02-16 VITALS — BP 109/74 | HR 79 | Ht 60.0 in | Wt 142.4 lb

## 2016-02-16 DIAGNOSIS — F331 Major depressive disorder, recurrent, moderate: Secondary | ICD-10-CM

## 2016-02-16 DIAGNOSIS — Z79899 Other long term (current) drug therapy: Secondary | ICD-10-CM

## 2016-02-16 MED ORDER — CLONAZEPAM 2 MG PO TABS: 2.0000 mg | ORAL_TABLET | Freq: Every day | ORAL | Status: DC

## 2016-02-16 MED ORDER — LITHIUM CARBONATE 300 MG PO CAPS: ORAL_CAPSULE | ORAL | Status: DC

## 2016-02-16 MED ORDER — OLANZAPINE 10 MG PO TABS: ORAL_TABLET | ORAL | Status: DC

## 2016-02-16 MED ORDER — BUPROPION HCL ER (XL) 300 MG PO TB24: 300.0000 mg | ORAL_TABLET | Freq: Every day | ORAL | Status: DC
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
## 2016-02-17 ENCOUNTER — Encounter (HOSPITAL_COMMUNITY): Payer: Self-pay | Admitting: Clinical

## 2016-02-17 ENCOUNTER — Ambulatory Visit (INDEPENDENT_AMBULATORY_CARE_PROVIDER_SITE_OTHER): Payer: Medicare Other | Admitting: Clinical

## 2016-02-17 DIAGNOSIS — F331 Major depressive disorder, recurrent, moderate: Secondary | ICD-10-CM

## 2016-02-17 DIAGNOSIS — F4310 Post-traumatic stress disorder, unspecified: Secondary | ICD-10-CM | POA: Diagnosis not present

## 2016-02-17 NOTE — Progress Notes (Signed)
   THERAPIST PROGRESS NOTE  Session Time: 2:35 -3:33  Participation Level: Active  Behavioral Response: CasualAlertDepressed  Type of Therapy: Individual Therapy  Treatment Goals addressed: improve psychiatric symptoms, elevate mood (improve self-esteem, increase hopefulness, decrease suicidal thoughts, decrease self-harming behavior), improve unhelpful thought patterns,   Interventions: CBT and Motivational Interviewing,   Summary: Natasha [name] is a 45 y.o. female who presents with Major Depressive Disorder, recurrent, severe without psychotic features and PTSD   Suicidal/Homicidal: No -without intent/plan  Therapist Response:  Latrica met with clinician for an individual session. [name] discussed her psychiatric symptoms, her current life events, and her homework. [name] shared that she had not self harmed this past week. She shared that she has continued to practice her grounding and mindfulness techniques. She shared that she is going to write 2 articles for [employer]. She stated that it was not a big deal. When she said this her expression was depressed. Client and clinician discussed her habit of blocking her positive emotions. [name] shared that she had a belief that she shouldn't be excited about such things. She shared about how this belief was formed. Client and clinician discussed how this belief keeps her locked into depression. Client and clinician discussed the way to challenge this belief. Client and clinician discussed the benefit of challenging this belief. [name] formulated a healthy alternative belief. Clinician asked open ended questions and [name] shared the positive things about the writing assignments. She allowed her self to be excited. Client and clinician discussed her experience. [name] shared her insight that her disallowing excitement came from a need for perfection. Clinician gave her a homework packet on Perfectionism. [name] agreed to complete the  packet and bring it back with her to next session. [name] shared she continues to use her grounding and mindfulness techniques and she agreed to continue until next session.   Plan: Return again in 1 weeks.  Diagnosis: Axis I: Major Depressive Disorder, recurrent, severe without psychotic features and PTSD    [name], LCSW 02/17/2016

## 2016-02-24 ENCOUNTER — Ambulatory Visit (HOSPITAL_COMMUNITY): Payer: Self-pay | Admitting: Clinical

## 2016-03-02 ENCOUNTER — Ambulatory Visit (INDEPENDENT_AMBULATORY_CARE_PROVIDER_SITE_OTHER): Payer: Medicare Other | Admitting: Clinical

## 2016-03-02 ENCOUNTER — Encounter (HOSPITAL_COMMUNITY): Payer: Self-pay | Admitting: Clinical

## 2016-03-02 DIAGNOSIS — F331 Major depressive disorder, recurrent, moderate: Secondary | ICD-10-CM | POA: Diagnosis not present

## 2016-03-02 DIAGNOSIS — F4310 Post-traumatic stress disorder, unspecified: Secondary | ICD-10-CM

## 2016-03-02 NOTE — Progress Notes (Signed)
   THERAPIST PROGRESS NOTE  Session Time: 2:30 -3:28  Participation Level: Active  Behavioral Response: NeatAlertDepressed  Type of Therapy: Individual Therapy  Treatment Goals addressed: improve psychiatric symptoms, elevate mood (improve self-esteem, increase hopefulness, decrease suicidal thoughts, decrease self-harming behavior), improve unhelpful thought patterns,   Interventions: CBT and Motivational Interviewing,   Summary: MELYNA HURON is a 45 y.o. female who presents with Major Depressive Disorder, recurrent, severe without psychotic features and PTSD   Suicidal/Homicidal: No -without intent/plan  Therapist Response: enifer met with clinician for an individual session. Ryelee discussed her psychiatric symptoms, her current life events, and her homework. Maurene shared that she had not self harmed this past week. She shared that she did experience some passive suicidal thoughts without intent or plan. She shared that she has continued to practice her grounding and mindfulness techniques. She shared that this was a very difficult past week. She shared that she felt like she regressed because she was walking using a cane the week before last and this past week when walking better she would stop and be frozen by fear. Client and clinician discussed her experience. She shared the negative automatic thoughts. Client and clinician discussed the evidence for and against the thoughts. Andrienne was able to formulate healthier alternative thoughts.  Talecia shared about witnessing her mother's falls and her thoughts about them. Harmonii and clinician discussed her thoughts about her body and its desire to protect her. Client and clinician discussed using her grounding techniques and mindfulness techniques (including soothing phrases). Client and clinician briefly reviewed and discuss her  Homework. [place] agreed to complete another packet and also continue her grounding and mindfulness  techniques.  Plan: Return again in 1- 2 weeks.  Diagnosis: Axis I: Major Depressive Disorder, recurrent, severe without psychotic features and PTSD   Sophonie Goforth A, LCSW 03/02/2016

## 2016-03-12 DIAGNOSIS — F332 Major depressive disorder, recurrent severe without psychotic features: Secondary | ICD-10-CM | POA: Diagnosis not present

## 2016-03-29 ENCOUNTER — Encounter (HOSPITAL_COMMUNITY): Payer: Self-pay | Admitting: Clinical

## 2016-03-29 ENCOUNTER — Ambulatory Visit (INDEPENDENT_AMBULATORY_CARE_PROVIDER_SITE_OTHER): Payer: Medicare Other | Admitting: Clinical

## 2016-03-29 DIAGNOSIS — F331 Major depressive disorder, recurrent, moderate: Secondary | ICD-10-CM | POA: Diagnosis not present

## 2016-03-29 DIAGNOSIS — F4310 Post-traumatic stress disorder, unspecified: Secondary | ICD-10-CM

## 2016-03-29 NOTE — Progress Notes (Signed)
   THERAPIST PROGRESS NOTE  Session Time: 3:33 -4:30  Participation Level: Active  Behavioral Response: NeatAlertDepressed  Type of Therapy: Individual Therapy  Treatment Goals addressed: improve psychiatric symptoms, elevate mood (improve self-esteem, increase hopefulness, decrease suicidal thoughts), improve unhelpful thought patterns,   Interventions: CBT and Motivational Interviewing,   Summary: KANESHIA CATER is a 45 y.o. female who presents with Major Depressive Disorder, recurrent, severe without psychotic features and PTSD   Suicidal/Homicidal: No -without intent/plan  Therapist Response: enifer met with clinician for an individual session. Xara discussed her psychiatric symptoms, her current life events, and her homework. [name] and clinician reviewed and discussed her homeowork packets ( perfectionism 1&2 - picked because effects self esteem and depression and has skills to challenge unhelpful thought procesess).  Jackquelyn shared that she recognized a belief "If I am not a complete success , I am waisting my life." Client and clinician discussed this belief and how it affects her mood and experience. Client and clinician discussed the evidence for and against the thought. [name] shared that she has been having difficulty functioning because she has been very depressed. [name] and clinician discussed her depression and her recurring negative thoughts. Client and clinician practice a grounding technique together. Client and clinician discussed what she might say to her younger self (where the thoughts originated). Client and clinician discussed what her older self might say to her now. Adalea shared her insight that it would be a similar message about living life with what is true now. [name] and clinician discussed action that [name] might take to challenge her thoughts and beliefs. [name] agreed to complete the next homework before next session.   Plan: Return again in  1- 2 weeks.  Diagnosis:Axis I: Major Depressive Disorder, recurrent, severe without psychotic features and PTSD   Alianah Lofton A, LCSW 03/29/2016

## 2016-04-05 ENCOUNTER — Ambulatory Visit (INDEPENDENT_AMBULATORY_CARE_PROVIDER_SITE_OTHER): Payer: Medicare Other | Admitting: Clinical

## 2016-04-05 DIAGNOSIS — F4310 Post-traumatic stress disorder, unspecified: Secondary | ICD-10-CM | POA: Diagnosis not present

## 2016-04-05 DIAGNOSIS — F331 Major depressive disorder, recurrent, moderate: Secondary | ICD-10-CM

## 2016-04-08 NOTE — Progress Notes (Signed)
   THERAPIST PROGRESS NOTE  Session Time: 2:32 - 3:30  Participation Level: Active  Behavioral Response: CasualAlertAnxious and Depressed  Type of Therapy: Individual Therapy  Treatment Goals addressed: improve psychiatric symptoms, elevate mood, (improve self-esteem, increase hopefulness, decrease self-harming behavior), improve unhelpful thought patterns, decrease irrational worries  Interventions: CBT and Motivational Interviewing,   Summary: Natasha [name] is a 45 y.o. female who presents with Major Depressive Disorder, recurrent, severe without psychotic features and PTSD   Suicidal/Homicidal: No -without intent/plan  Therapist Response:  [name] met with clinician for an individual session. [name] discussed her psychiatric symptoms, her current life events, and her homework. [name] and clinician reviewed and discussed her homework. [name] shared her thoughts and insights from the homework packet (depression #4 and #5). [name] shared that she had a fall this week. She shared that because of her negative thoughts she self harmed 1x. Client and clinician discussed the self harm. She denied any suicidal ideation. Client and clinician discussed the fall and her perceptions and negative thoughts about the fall. Client and clinician discussed her negative automatic thoughts. Client and clinician discussed the evidence for and against her thoughts. [name] shared her thoughts and insights as she formulated healthier alternative thoughts. [name] and clinician discussed the process. [name] and clinician discussed her change in perspective and how her actions might change. Client and clinician discussed how she could apply the same skill to future life events. [name] agreed to complete the next homework packet and to practice questioning her negative thoughts. [name] and clinician discussed her mindfulness techniques. [name] shared that her Mom suggest she go to physical therapist to  help her build her skills and strength. [name] shared that she felt  Better about the thought of doing it since working her negative thoughts.    Plan: Return again in 1 weeks.  Diagnosis: Axis I: Major Depressive Disorder, recurrent, severe without psychotic features and PTSD    Nashon Erbes A, LCSW 04/08/2016

## 2016-04-09 ENCOUNTER — Encounter (HOSPITAL_COMMUNITY): Payer: Self-pay | Admitting: Clinical

## 2016-04-09 DIAGNOSIS — F332 Major depressive disorder, recurrent severe without psychotic features: Secondary | ICD-10-CM | POA: Diagnosis not present

## 2016-04-13 ENCOUNTER — Ambulatory Visit (INDEPENDENT_AMBULATORY_CARE_PROVIDER_SITE_OTHER): Payer: Medicare Other | Admitting: Clinical

## 2016-04-13 ENCOUNTER — Encounter (HOSPITAL_COMMUNITY): Payer: Self-pay | Admitting: Clinical

## 2016-04-13 DIAGNOSIS — F332 Major depressive disorder, recurrent severe without psychotic features: Secondary | ICD-10-CM

## 2016-04-13 DIAGNOSIS — F4310 Post-traumatic stress disorder, unspecified: Secondary | ICD-10-CM

## 2016-04-14 NOTE — Progress Notes (Signed)
   THERAPIST PROGRESS NOTE  Session Time: 2:00 -3:00  Participation Level: Active  Behavioral Response: CasualAlertDepressed  Type of Therapy: Individual Therapy  Treatment Goals addressed: improve psychiatric symptoms, elevate mood (improve self-esteem, increase hopefulness, decrease self-harming behavior), improve unhelpful thought patterns, decrease irrational worries,  Interventions: CBT and Motivational Interviewing, Grounding and Mindfulness Techniques  Summary: Natasha [name] is a 45 y.o. female who presents with Major Depressive Disorder, recurrent, severe without psychotic features and PTSD   Suicidal/Homicidal: No -without intent/plan  Therapist Response:  Leda met with clinician for an individual session. Natasha [name] discussed her psychiatric symptoms, her current life events, and her homework. Natasha [name] shared that she has had some good and some bad days since last session but did not self harm. Client and clinician discussed her insights about her thoughts that had led to past self harm verse this week.Natasha [name] shared that she had completed the homework packet. Client and clinician reviewed and discussed her homework. She shared about her work - disputing unhelpful thoughts. Natasha [name] gave examples from her daily life. Consepcion and clinician discussed her actions since last session. Natasha [name] shared that she was working on her negative thoughts and was able to get her self to walk. Client and clinician discussed her process of challenging her fear of falling. She shared that she also challenged herself to go to the barn. She used to love horses and compete. Client and clinician discussed her experience there. Natasha [name] shared about challenging some of her negative thoughts there. Natasha [name] shared about her concern about how others would interact with her. She shared that she challenged those thoughts and interacted with others. Client and clinician discussed her thoughts and insights about  challenging her thoughts this past week. Natasha [name] shared that she was finding her negative thoughts to be less true than she had believed before. Client and clinician discussed how she could continue to challenge her thoughts.    Plan: Return again in 1 weeks.  Diagnosis: Axis I: Major Depressive Disorder, recurrent, severe without psychotic features and PTSD    Julina Altmann A, LCSW 04/14/2016

## 2016-05-04 ENCOUNTER — Ambulatory Visit (INDEPENDENT_AMBULATORY_CARE_PROVIDER_SITE_OTHER): Payer: Medicare Other | Admitting: Clinical

## 2016-05-04 ENCOUNTER — Encounter (HOSPITAL_COMMUNITY): Payer: Self-pay | Admitting: Clinical

## 2016-05-04 DIAGNOSIS — F332 Major depressive disorder, recurrent severe without psychotic features: Secondary | ICD-10-CM

## 2016-05-04 DIAGNOSIS — F331 Major depressive disorder, recurrent, moderate: Secondary | ICD-10-CM | POA: Diagnosis not present

## 2016-05-04 DIAGNOSIS — F4310 Post-traumatic stress disorder, unspecified: Secondary | ICD-10-CM

## 2016-05-04 DIAGNOSIS — Z79899 Other long term (current) drug therapy: Secondary | ICD-10-CM | POA: Diagnosis not present
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
## 2016-05-05 LAB — LITHIUM LEVEL: Lithium Lvl: 1.1 mEq/L (ref 0.80–1.40)

## 2016-05-11 ENCOUNTER — Ambulatory Visit (INDEPENDENT_AMBULATORY_CARE_PROVIDER_SITE_OTHER): Payer: Medicare Other | Admitting: Clinical

## 2016-05-11 ENCOUNTER — Encounter (HOSPITAL_COMMUNITY): Payer: Self-pay | Admitting: Clinical

## 2016-05-11 DIAGNOSIS — F332 Major depressive disorder, recurrent severe without psychotic features: Secondary | ICD-10-CM | POA: Diagnosis not present

## 2016-05-11 DIAGNOSIS — F4310 Post-traumatic stress disorder, unspecified: Secondary | ICD-10-CM | POA: Diagnosis not present

## 2016-05-11 NOTE — Progress Notes (Signed)
   THERAPIST PROGRESS NOTE  Session Time: 1:30 - 2:30  Participation Level: Active  Behavioral Response: CasualAlertDepressed  Type of Therapy: Individual Therapy  Treatment Goals addressed: improve psychiatric symptoms, elevate mood  (decrease suicidal thoughts, decrease self-harming behavior), improve unhelpful thought patterns, decrease irrational worries, discuss and process trauma  Interventions: CBT and Motivational Interviewing, Grounding and Mindfulness Techniques  Summary: TRENITY PHA is a 45 y.o. female who presents with Major Depressive Disorder, recurrent, severe without psychotic features and PTSD   Suicidal/Homicidal: No -without intent/plan  Therapist Response:  Jamison met with clinician for an individual session. Milka discussed her psychiatric symptoms, her current life events, and her homework. Raizel shared that she had not self-harm but she had had some passive thoughts this past week. Client and clinician discussed the thoughts.  Bobie denied any current suicidal or homicidal ideation. Client and clinician discussed options should she have active suicidal ideation.  Yarexi shared that in addition she had not been walking and therefore has been isolating in her house. Cli clinician asked open ended questions and Tifanie shared a bout why she was a free to walk. Client and clinician discussed how this fear differed from when she was walking. Sloka shared that she was afraid of falling client and clinician discussed how this is related to her everyday life and her trauma. Skie shared a bout her thoughts at the time the trauma to place. Marelyn presented the evidence for and against her negative thoughts. [name] then formulated healthier alternative thoughts. Client and clinician discussed her negative automatic thoughts a bout walking. [name] had the insight that the evidence for and against was the same as her negative thoughts when the trauma to place. She  had the insight that the healthier alternative thought would be applicable to her current situation. Client and clinician discussed how she might use this information to help elevate her mood and to become more aware what's available to her. Ricca and clinician agreed to discuss her homework packet at next session. Amilee a agreed to practice her grounding and mindfulness techniques until next session. Ciana again denied any current suicidal or homicidal ideation. Client and clinician practiced a grounding technique together.   Plan: Return again in 1 weeks.  Diagnosis: Axis I: Major Depressive Disorder, recurrent, severe without psychotic features and PTSD    Tyler Cubit A, LCSW 05/11/2016

## 2016-05-18 ENCOUNTER — Ambulatory Visit (HOSPITAL_COMMUNITY): Payer: Self-pay | Admitting: Psychiatry

## 2016-05-19 ENCOUNTER — Ambulatory Visit (INDEPENDENT_AMBULATORY_CARE_PROVIDER_SITE_OTHER): Payer: Medicare Other | Admitting: Clinical

## 2016-05-19 ENCOUNTER — Encounter (HOSPITAL_COMMUNITY): Payer: Self-pay | Admitting: Clinical

## 2016-05-19 DIAGNOSIS — F332 Major depressive disorder, recurrent severe without psychotic features: Secondary | ICD-10-CM | POA: Diagnosis not present

## 2016-05-19 DIAGNOSIS — F4310 Post-traumatic stress disorder, unspecified: Secondary | ICD-10-CM | POA: Diagnosis not present

## 2016-05-19 NOTE — Progress Notes (Signed)
   THERAPIST PROGRESS NOTE  Session Time: 2:32 -3:30  Participation Level: Active  Behavioral Response: CasualAlertDepressed  Type of Therapy: Individual Therapy  Treatment Goals addressed: improve psychiatric symptoms, elevate mood, (decrease suicidal thoughts, decrease self-harming behavior), improve unhelpful thought patterns, decrease irrational worries,  Interventions: CBT and Motivational Interviewing, Grounding and Mindfulness Techniques  Summary: MCKELL RIECKE is a 45 y.o. female who presents with Major Depressive Disorder, recurrent, severe without psychotic features and PTSD   Suicidal/Homicidal: No -without intent/plan  Therapist Response:  Maisyn met with clinician for an individual session. Talia discussed her psychiatric symptoms, her current life events, and her homework. [name] shared that she has self harmed by since last session. Client and clinician discussed the self harm she shared that it serves 2 purposes one as punishment and to release. Client and clinician discussed alternative healthier alternative coping skills. Kashonda shared that she has continued to avoid walking. Senta discussed her negative automatic thoughts. She shared her rumination and Negative core. Client and clinician discussed the evidence for and against some of the thoughts. Client and clinician discussed how she might interact in the world differently her thoughts were different. Deniesha shared some of her negative automatic thoughts a bout herself. Clinician asked her to imagine somebody she cared about and then telling them that these were true for them. She shared that she would never tell anybody else is negative things. Clinician pointed out that if they could be true or anybody else is likelihood that they are not true for her. Client and clinician discussed how we others there are honesty and  Support. Client and clinician discussed how she could do this or herself. Simcha and clinician  formulated two symbols -when she symbolize something he would happily give to another love and 1 to symbolize unhealthy unkind thought. Mekaela agreed to evaluate her thoughts to see which symbol would represent them.Jonasia agreed to use her grounding and mindfulness techniques. Client and clinician reviewed and practice a grounding technique..       Plan: Return again in 1 weeks.  Diagnosis: Axis I: Major Depressive Disorder, recurrent, severe without psychotic features and PTSD    Dora Simeone A, LCSW 05/19/2016

## 2016-05-28 DIAGNOSIS — F329 Major depressive disorder, single episode, unspecified: Secondary | ICD-10-CM | POA: Diagnosis not present

## 2016-05-28 DIAGNOSIS — F319 Bipolar disorder, unspecified: Secondary | ICD-10-CM | POA: Diagnosis not present

## 2016-05-28 DIAGNOSIS — R531 Weakness: Secondary | ICD-10-CM | POA: Diagnosis not present

## 2016-05-28 DIAGNOSIS — F332 Major depressive disorder, recurrent severe without psychotic features: Secondary | ICD-10-CM | POA: Diagnosis not present

## 2016-05-28 DIAGNOSIS — M4322 Fusion of spine, cervical region: Secondary | ICD-10-CM | POA: Diagnosis not present

## 2016-05-30 ENCOUNTER — Other Ambulatory Visit (HOSPITAL_COMMUNITY): Payer: Self-pay | Admitting: Psychiatry

## 2016-05-31 ENCOUNTER — Ambulatory Visit (HOSPITAL_COMMUNITY): Payer: Self-pay | Admitting: Clinical

## 2016-06-03 ENCOUNTER — Ambulatory Visit (HOSPITAL_COMMUNITY): Payer: Self-pay | Admitting: Psychiatry

## 2016-06-09 ENCOUNTER — Encounter (HOSPITAL_COMMUNITY): Payer: Self-pay | Admitting: Psychiatry

## 2016-06-09 ENCOUNTER — Ambulatory Visit (INDEPENDENT_AMBULATORY_CARE_PROVIDER_SITE_OTHER): Payer: Medicare Other | Admitting: Psychiatry

## 2016-06-09 VITALS — BP 122/70 | HR 91 | Ht 60.0 in | Wt 141.8 lb

## 2016-06-09 DIAGNOSIS — F331 Major depressive disorder, recurrent, moderate: Secondary | ICD-10-CM | POA: Diagnosis not present

## 2016-06-09 MED ORDER — BUPROPION HCL ER (XL) 300 MG PO TB24: 300.0000 mg | ORAL_TABLET | Freq: Every day | ORAL | Status: DC

## 2016-06-09 MED ORDER — OLANZAPINE 10 MG PO TABS: ORAL_TABLET | ORAL | Status: DC

## 2016-06-09 MED ORDER — LITHIUM CARBONATE 300 MG PO CAPS: ORAL_CAPSULE | ORAL | Status: DC

## 2016-06-09 MED ORDER — CLONAZEPAM 2 MG PO TABS: 2.0000 mg | ORAL_TABLET | Freq: Every day | ORAL | Status: DC
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
## 2016-06-10 ENCOUNTER — Other Ambulatory Visit (HOSPITAL_COMMUNITY): Payer: Self-pay | Admitting: Psychiatry

## 2016-06-13 ENCOUNTER — Other Ambulatory Visit (HOSPITAL_COMMUNITY): Payer: Self-pay | Admitting: Psychiatry

## 2016-06-17 ENCOUNTER — Ambulatory Visit (INDEPENDENT_AMBULATORY_CARE_PROVIDER_SITE_OTHER): Payer: Medicare Other | Admitting: Clinical

## 2016-06-17 ENCOUNTER — Encounter (HOSPITAL_COMMUNITY): Payer: Self-pay | Admitting: Clinical

## 2016-06-17 DIAGNOSIS — F4310 Post-traumatic stress disorder, unspecified: Secondary | ICD-10-CM | POA: Diagnosis not present

## 2016-06-17 DIAGNOSIS — F332 Major depressive disorder, recurrent severe without psychotic features: Secondary | ICD-10-CM | POA: Diagnosis not present

## 2016-06-17 NOTE — Progress Notes (Addendum)
   THERAPIST PROGRESS NOTE  Session Time: 1:32 - 2:28  Participation Level: Active  Behavioral Response: CasualAlertDepressed  Type of Therapy: Individual Therapy  Treatment Goals addressed: improve psychiatric symptoms, elevate mood,  increase hopefulness, improve unhelpful thought patterns, decrease irrational worries,   Interventions: CBT and Motivational Interviewing,   Summary: CLIFFORD COUDRIET is a 45 y.o. female who presents with Major Depressive Disorder, recurrent, severe without psychotic features and PTSD   Suicidal/Homicidal: No -without intent/plan  Therapist Response:  Norie met with clinician for an individual session. Aleea discussed her psychiatric symptoms, her current life events, and her homework. [name] shared that she had been tracking her thoughts this week and found that there was a good portion of them that she would not say to others. Client and clinician discussed her thoughts and insights about her thought tracking. Christeena shared that she has continued to avoid walking. Client and clinician discussed [name]'s thoughts about walking. [name] shared a core belief about fear keeping her safe. Clinician asked how she felt when she listened to fear about her abilities and potential. Eunique shared that listening to her fear made her depressed but kept her safe. Client and clinician discussed her negative core belief, the evidence for and against the belief. Syrita shared her insight that while her fears kept her safe in the immediate listening to her fears did not keep her safe and actually made her unsafe in the long run. Amaree shared that she would like to work towards walking this week. Clinician asked open ended questions. Astou explored her motivations for making this change. Leena also discussed some anger she has been experiencing. Client and clinician discussed the situations that triggered her anger ( which when acted on makes her feel bad about  herself). Client and clinician discussed her negative automatic thoughts. She explored the evidence for and against the negative automatic thoughts. [name] was able to formulate healthier alternative thoughts. [name] agreed to challenge her negative thoughts when she recognized them and report back on her progress at next session.      Plan: Return again in 1 weeks.  Diagnosis: Axis I: Major Depressive Disorder, recurrent, severe without psychotic features and PTSD   Sy Saintjean A, LCSW 06/17/2016

## 2016-06-24 ENCOUNTER — Encounter (HOSPITAL_COMMUNITY): Payer: Self-pay | Admitting: Clinical

## 2016-06-24 ENCOUNTER — Ambulatory Visit (INDEPENDENT_AMBULATORY_CARE_PROVIDER_SITE_OTHER): Payer: Medicare Other | Admitting: Clinical

## 2016-06-24 DIAGNOSIS — F332 Major depressive disorder, recurrent severe without psychotic features: Secondary | ICD-10-CM

## 2016-06-24 DIAGNOSIS — F4310 Post-traumatic stress disorder, unspecified: Secondary | ICD-10-CM | POA: Diagnosis not present

## 2016-06-24 NOTE — Progress Notes (Signed)
   THERAPIST PROGRESS NOTE  Session Time: 1:30 - 2:26  Participation Level: Active  Behavioral Response: CasualAlertDepressed  Type of Therapy: Individual Therapy  Treatment Goals addressed: improve psychiatric symptoms, elevate mood (improve self-esteem, increase hopefulness), improve unhelpful thought patterns, decrease irrational worries,  Interventions: CBT and Motivational Interviewing,   Summary: Natasha [name] is a 45 y.o. female who presents with Major Depressive Disorder, recurrent, severe without psychotic features and PTSD   Suicidal/Homicidal: No -without intent/plan  Therapist Response:  Natasha [name] met with clinician for an individual session. Natasha [name] discussed her psychiatric symptoms, her current life events, and her homework. Natasha [name] shared that she had been tracking her thoughts but has been having difficulty changing them. She shared that she has been ruminating on the thought that her life has no meaning. She stated she was not having suicidal ideation or homicidal ideation. Client and clinician discussed core belief. Client and clinician discussed the evidence for and against the belief. Natasha [name] was able to identify some evidence that her life did have meaning. Client and clinician discussed how she would feel if she held the belief my life has some meaning. Clinician asked open ended questions and Natasha [name] shared how her thoughts and actions might change. Natasha [name] shared that she believed it would be difficult to change this belief. Clinician asked open ended questions and Natasha [name] identified several times in her life prior that she had chnaged her beliefs easily. Natasha [name] agreed to practice the improve belief until next session and report back on her experience at next session.   Plan: Return again in 1 weeks.  Diagnosis: Axis I: Major Depressive Disorder, recurrent, severe without psychotic features and PTSD   [name] A, LCSW 06/24/2016

## 2016-07-01 ENCOUNTER — Ambulatory Visit (INDEPENDENT_AMBULATORY_CARE_PROVIDER_SITE_OTHER): Payer: Medicare Other | Admitting: Clinical

## 2016-07-01 ENCOUNTER — Encounter (HOSPITAL_COMMUNITY): Payer: Self-pay | Admitting: Clinical

## 2016-07-01 DIAGNOSIS — F4310 Post-traumatic stress disorder, unspecified: Secondary | ICD-10-CM

## 2016-07-01 DIAGNOSIS — F332 Major depressive disorder, recurrent severe without psychotic features: Secondary | ICD-10-CM

## 2016-07-01 NOTE — Progress Notes (Signed)
   THERAPIST PROGRESS NOTE  Session Time: 1:30 -2:28  Participation Level: Active  Behavioral Response: CasualAlertDepressed  Type of Therapy: Individual Therapy  Treatment Goals addressed: improve psychiatric symptoms, elevate mood (improve self-esteem, increase hopefulness), improve unhelpful thought patterns, decrease irrational worries,  Interventions: CBT and Motivational Interviewing, Grounding and Mindfulness Techniques  Summary: Natasha [name] is a 45 y.o. female who presents with Major Depressive Disorder, recurrent, severe without psychotic features and PTSD   Suicidal/Homicidal: No -without intent/plan  Therapist Response:  Natasha [name] met with [name] for an individual session. Natasha [name] discussed her psychiatric symptoms, her current life events, and her homework. Natasha [name] shared that she is having ETC tomorrow. Natasha [name] denied any suicidal or homicidal ideation or any current self harming behaviors. Natasha [name] shared that she had practiced her improved belief, she shared that it went very good for a few days but then decreased in effectiveness. She shared that she felt good but then worse as the new belief lost its effectiveness. Client and [name] discussed the belief she would like to hold. [name] asked open ended questions and [name] argued for the new belief, as she did so her voice became fuller and her posture improved. Client and [name] discussed her experience with the new belief. Natasha [name] shared that it felt like it was true and that she felt stronger. Client and [name] discussed how holding this belief would change her perceptions and actions. Natasha [name] shared that she would be more social and less afraid to walk if she held this belief. (my life has meaning). Natasha [name] and [name] discussed how to challenge and re-challenge limiting beliefs and how to strengthen new beliefs. Natasha [name] discussed progress rather than perfection. Natasha [name] agreed to continue to  practice the new belief and to report back on her progress.    Plan: Return again in 1 weeks.  Diagnosis: Axis I: Major Depressive Disorder, recurrent, severe without psychotic features and PTSD    [name] A, LCSW 07/01/2016

## 2016-07-07 ENCOUNTER — Emergency Department (HOSPITAL_COMMUNITY)
Admission: EM | Admit: 2016-07-07 | Discharge: 2016-07-07 | Disposition: A | Discharge status: Home / Self Care | Payer: Medicare Other | Attending: Emergency Medicine | Admitting: Emergency Medicine

## 2016-07-07 ENCOUNTER — Emergency Department (HOSPITAL_COMMUNITY): Payer: Medicare Other

## 2016-07-07 ENCOUNTER — Encounter (HOSPITAL_COMMUNITY): Payer: Self-pay | Admitting: Emergency Medicine

## 2016-07-07 DIAGNOSIS — Y999 Unspecified external cause status: Secondary | ICD-10-CM | POA: Diagnosis not present

## 2016-07-07 DIAGNOSIS — F329 Major depressive disorder, single episode, unspecified: Secondary | ICD-10-CM | POA: Diagnosis not present

## 2016-07-07 DIAGNOSIS — W19XXXA Unspecified fall, initial encounter: Secondary | ICD-10-CM | POA: Insufficient documentation

## 2016-07-07 DIAGNOSIS — Z79899 Other long term (current) drug therapy: Secondary | ICD-10-CM | POA: Insufficient documentation

## 2016-07-07 DIAGNOSIS — S60212A Contusion of left wrist, initial encounter: Secondary | ICD-10-CM | POA: Diagnosis not present

## 2016-07-07 DIAGNOSIS — Y939 Activity, unspecified: Secondary | ICD-10-CM | POA: Diagnosis not present

## 2016-07-07 DIAGNOSIS — Z791 Long term (current) use of non-steroidal anti-inflammatories (NSAID): Secondary | ICD-10-CM | POA: Diagnosis not present

## 2016-07-07 DIAGNOSIS — S6992XA Unspecified injury of left wrist, hand and finger(s), initial encounter: Secondary | ICD-10-CM | POA: Diagnosis present

## 2016-07-07 DIAGNOSIS — S63502A Unspecified sprain of left wrist, initial encounter: Secondary | ICD-10-CM | POA: Diagnosis not present

## 2016-07-07 DIAGNOSIS — Y929 Unspecified place or not applicable: Secondary | ICD-10-CM | POA: Diagnosis not present

## 2016-07-07 DIAGNOSIS — S63501A Unspecified sprain of right wrist, initial encounter: Secondary | ICD-10-CM | POA: Diagnosis not present

## 2016-07-07 MED ORDER — HYDROCODONE-ACETAMINOPHEN 5-325 MG PO TABS: 2.0000 | ORAL_TABLET | ORAL | 0 refills | Status: DC | PRN

## 2016-07-07 NOTE — ED Triage Notes (Signed)
Patient states she fell today at 1330 landing on her left hand. Bruising and obvious deformity noted to left wrist at triage.

## 2016-07-07 NOTE — ED Provider Notes (Signed)
[facility]-EMERGENCY DEPT Provider Note   CSN: [id number] Arrival date & time: 07/07/16  1521  First Provider Contact:  15:38pm       History   Chief Complaint Chief Complaint  Patient presents with  . Fall  . Wrist Injury    HPI Natasha [name] is a 45 y.o. female. She reports falling on outstretched left wrist about one half hour prior to arrival. Ecchymosis and soft tissue swelling and apparent deformity to her left wrist. Apparently has a baseline deformity to the wrist that holds her in flexion. Denies numbness. Denies break in the skin or bleeding. No other areas of pain or injury.  HPI  Past Medical History:  Diagnosis Date  . C6 spinal cord injury (HCC)   . Depression     Patient Active Problem List   Diagnosis Date Noted  . MDD (major depressive disorder) (HCC) 10/02/2015  . Severe recurrent major depression without psychotic features (HCC) 06/19/2015    Past Surgical History:  Procedure Laterality Date  . BACK SURGERY      OB History    No data available       Home Medications    Prior to Admission medications   Medication Sig Start Date End Date Taking? Authorizing Provider  buPROPion (WELLBUTRIN XL) 300 MG 24 hr tablet Take 1 tablet (300 mg total) by mouth daily. 06/09/16  Yes Cleotis Nipper, MD  clonazePAM (KLONOPIN) 2 MG tablet Take 1 tablet (2 mg total) by mouth at bedtime. 06/09/16  Yes Cleotis Nipper, MD  cyclobenzaprine (FLEXERIL) 10 MG tablet Take 20 mg by mouth at bedtime.  06/26/15  Yes Historical Provider, MD  ibuprofen (ADVIL,MOTRIN) 200 MG tablet Take 800 mg by mouth every 6 (six) hours as needed for moderate pain.    Yes Historical Provider, MD  lithium carbonate 300 MG capsule Take 1 in am and 2 at bed time 06/09/16  Yes Cleotis Nipper, MD  OLANZapine (ZYPREXA) 10 MG tablet TAKE 1 TABLET(10 MG) BY MOUTH AT BEDTIME 06/09/16  Yes Cleotis Nipper, MD  oxymetazoline (AFRIN) 0.05 % nasal spray Place 1 spray into both nostrils 2 (two) times daily as needed  for congestion.   Yes Historical Provider, MD  HYDROcodone-acetaminophen (NORCO/VICODIN) 5-325 MG tablet Take 2 tablets by mouth every 4 (four) hours as needed. 07/07/16   Rolland Porter, MD  SUMAtriptan (IMITREX) 25 MG tablet Take 1 tablet (25 mg total) by mouth every 2 (two) hours as needed for migraine or headache. May repeat in 2 hours if headache persists or recurs. 09/25/15   Beau Fanny, FNP    Family History Family History  Problem Relation Age of Onset  . Alcohol abuse Father   . Alcohol abuse Brother   . Depression Mother   . Alcohol abuse Mother     Social History Social History  Substance Use Topics  . Smoking status: Never Smoker  . Smokeless tobacco: Never Used     Comment: No smoking hx; no need for cessation materials  . Alcohol use 0.0 oz/week     Comment: One drink a week at most; rarely drinks     Allergies   Review of patient's allergies indicates no known allergies.   Review of Systems Review of Systems  Constitutional: Negative for appetite change, chills, diaphoresis, fatigue and fever.  HENT: Negative for mouth sores, sore throat and trouble swallowing.   Eyes: Negative for visual disturbance.  Respiratory: Negative for cough, chest tightness, shortness of breath  and wheezing.   Cardiovascular: Negative for chest pain.  Gastrointestinal: Negative for abdominal distention, abdominal pain, diarrhea, nausea and vomiting.  Endocrine: Negative for polydipsia, polyphagia and polyuria.  Genitourinary: Negative for dysuria, frequency and hematuria.  Musculoskeletal: Positive for arthralgias. Negative for gait problem.       Left wrist pain swelling and deformity  Skin: Negative for color change, pallor and rash.  Neurological: Negative for dizziness, syncope, light-headedness and headaches.  Hematological: Does not bruise/bleed easily.  Psychiatric/Behavioral: Negative for behavioral problems and confusion.     Physical Exam Updated Vital Signs BP  100/81 (BP Location: Right Arm)   Pulse 83   Temp 98.5 F (36.9 C) (Oral)   Resp 14   Ht 5' (1.524 m)   Wt 140 lb (63.5 kg)   LMP 06/23/2016   SpO2 100%   BMI 27.34 kg/m   Physical Exam  Constitutional: She is oriented to person, place, and time. She appears well-developed and well-nourished. No distress.  HENT:  Head: Normocephalic.  Eyes: Conjunctivae are normal. Pupils are equal, round, and reactive to light. No scleral icterus.  Neck: Normal range of motion. Neck supple. No thyromegaly present.  Cardiovascular: Normal rate and regular rhythm.  Exam reveals no gallop and no friction rub.   No murmur heard. Pulmonary/Chest: Effort normal and breath sounds normal. No respiratory distress. She has no wheezes. She has no rales.  Abdominal: Soft. Bowel sounds are normal. She exhibits no distension. There is no tenderness. There is no rebound.  Musculoskeletal: Normal range of motion.  Limited range of motion of the left wrist due to pain. She has soft tissue swelling dorsally, ecchymosis. Normal range of motion of the fingers and thumb. No pain to the metacarpals. No elbow or shoulder pain.  Neurological: She is alert and oriented to person, place, and time.  Skin: Skin is warm and dry. No rash noted.  Psychiatric: She has a normal mood and affect. Her behavior is normal.     ED Treatments / Results  Labs (all labs ordered are listed, but only abnormal results are displayed) Labs Reviewed - No data to display  EKG  EKG Interpretation None       Radiology Dg Wrist Complete Left  Result Date: 07/07/2016 CLINICAL DATA:  Larey Seat about 1:30 p.m. today atretic catch herself. Bruising swelling on top of wrist. EXAM: LEFT WRIST - COMPLETE 3+ VIEW COMPARISON:  None. FINDINGS: There is no evidence of fracture or dislocation. There is no evidence of arthropathy or other focal bone abnormality. Soft tissues are unremarkable. IMPRESSION: Negative. Electronically Signed   By: Kennith Center  M.D.   On: 07/07/2016 15:55   Procedures Procedures (including critical care time)  Medications Ordered in ED Medications - No data to display   Initial Impression / Assessment and Plan / ED Course  I have reviewed the triage vital signs and the nursing notes.  Pertinent labs & imaging results that were available during my care of the patient were reviewed by me and considered in my medical decision making (see chart for details).  Clinical Course    X-ray noted. No fracture. Patient placed into an Ace wrap and volar splint. I reexamined her after this was position is properly placed.  SPLINT APPLICATION Date/Time: 4:38 PM Authorized by: Claudean Kinds Consent: Verbal consent obtained. Risks and benefits: risks, benefits and alternatives were discussed Consent given by: patient Splint applied by: ED technician Location details: Lt wrist Splint type: Volar, ortho-glass Supplies used: ace  x2, othho-glass Post-procedure: The splinted body part was neurovascularly unchanged following the procedure. Patient tolerance: Patient tolerated the procedure well with no immediate complications.     Final Clinical Impressions(s) / ED Diagnoses   Final diagnoses:  Wrist sprain, left, initial encounter    New Prescriptions New Prescriptions   HYDROCODONE-ACETAMINOPHEN (NORCO/VICODIN) 5-325 MG TABLET    Take 2 tablets by mouth every 4 (four) hours as needed.     Rolland Porter, MD 07/07/16 [phone]

## 2016-07-07 NOTE — ED Notes (Signed)
Pt just returned from xray 

## 2016-07-07 NOTE — Discharge Instructions (Signed)
Apply ice for 20 minutes 2-3 times per day.  You may remove the splint and slowly increase your use of the wrist and hand after 48 hours as the swelling and pain improve.

## 2016-07-08 ENCOUNTER — Ambulatory Visit (INDEPENDENT_AMBULATORY_CARE_PROVIDER_SITE_OTHER): Payer: Medicare Other | Admitting: Clinical

## 2016-07-08 DIAGNOSIS — F332 Major depressive disorder, recurrent severe without psychotic features: Secondary | ICD-10-CM | POA: Diagnosis not present

## 2016-07-08 DIAGNOSIS — F4310 Post-traumatic stress disorder, unspecified: Secondary | ICD-10-CM

## 2016-07-08 NOTE — Progress Notes (Signed)
   THERAPIST PROGRESS NOTE  Session Time: 1:30 - 2:28  Participation Level: Active  Behavioral Response: CasualAlertAnxious and Depressed  Type of Therapy: Individual Therapy  Treatment Goals addressed: improve psychiatric symptoms, elevate mood, improve self-esteem, (increase hopefulness, decrease self-harming behavior), improve unhelpful thought patterns, decrease irrational worries,  Interventions: CBT and Motivational Interviewing, Grounding and Mindfulness Techniques  Summary: Natasha [name] is a 44 y.o. female who presents with Major Depressive Disorder, recurrent, severe without psychotic features and PTSD   Suicidal/Homicidal: No -without intent/plan  Therapist Response:  Jonna met with clinician for an individual session. [name] discussed her psychiatric symptoms, her current life events, and her homework. [name] shared that she felt good after leaving therapy and that good feeling lasted until a few days before today. She shared that she had been practicing her new belief (my life has meaning) and was able to maintain it until a few days ago.  She shared that when she was no longer able to hold the new thought, she self harmed. [name] denied any current suicidal or homicidal ideation. Client and clinician discussed the negative thoughts that led to self harm. She shared that she felt like she failed. Client and clinician discussed perspective. Client and clinician discussed the fact that she was able to maintain the new belief for 6 out of the last 8 days. Client and clinician discussed progress rather than perfectionism. Client and clinician the process of changing a belief and how old thoughts and beliefs sometimes reappear. Client and clinician explored and identified the evidence for the new belief. [name] identified the benefits of the new belief and she shared about how her experience was different (more positive)  when she was practicing the new belief. [name] and  clinician discussed her practice for the upcoming week with the allowance for set backs. [name] agreed not to self harm.  [name] agreed to continue to practice the new belief and to report back on her progress. She was smiling when she left session.   Plan: Return again in 1 weeks.  Diagnosis: Axis I: Major Depressive Disorder, recurrent, severe without psychotic features and PTSD     Troy Hartzog A, LCSW 07/08/2016

## 2016-07-09 DIAGNOSIS — F332 Major depressive disorder, recurrent severe without psychotic features: Secondary | ICD-10-CM | POA: Diagnosis not present

## 2016-07-09 DIAGNOSIS — F329 Major depressive disorder, single episode, unspecified: Secondary | ICD-10-CM | POA: Diagnosis not present

## 2016-07-09 DIAGNOSIS — R531 Weakness: Secondary | ICD-10-CM | POA: Diagnosis not present

## 2016-07-14 ENCOUNTER — Ambulatory Visit (INDEPENDENT_AMBULATORY_CARE_PROVIDER_SITE_OTHER): Payer: Medicare Other | Admitting: Orthopaedic Surgery

## 2016-07-14 ENCOUNTER — Encounter: Payer: Self-pay | Admitting: Orthopaedic Surgery

## 2016-07-14 DIAGNOSIS — S60222A Contusion of left hand, initial encounter: Secondary | ICD-10-CM | POA: Diagnosis not present

## 2016-07-14 NOTE — Progress Notes (Signed)
Subjective: I fell and hurt my left hand    Patient ID: Natasha [name], female    DOB: August 16, 1971, 45 y.o.   MRN: [id number]  HPI She fell at home and hurt her left hand and wrist on 07-07-16.  She was seen in the ER.  She had x-rays done which were negative.  She had significant swelling and hematoma of the hand.  She was given a splint.  She had no other injury.  Past history is significant for contracture of the left hand the little, ring and long fingers.  She has motion of the index and thumb.  She has chronic pain of the left hand and some of the left wrist. She has weakness of the left side of the body and uses a Canadian crutch on the right hand and arm.  Her swelling has continued and her hand is tender.  She is seen after the ER visit.  Some of the swelling has decreased.  Her mother accompanies her.  Her mother is a physical therapist retired.     Review of Systems  HENT: Negative for congestion.   Respiratory: Negative for cough and shortness of breath.   Cardiovascular: Negative for chest pain and leg swelling.  Endocrine: Positive for cold intolerance.  Musculoskeletal: Positive for arthralgias, gait problem and joint swelling.  Allergic/Immunologic: Positive for environmental allergies.   Past Medical History:  Diagnosis Date  . C6 spinal cord injury (HCC)   . Depression     Past Surgical History:  Procedure Laterality Date  . BACK SURGERY      Current Outpatient Prescriptions on File Prior to Visit  Medication Sig Dispense Refill  . buPROPion (WELLBUTRIN XL) 300 MG 24 hr tablet Take 1 tablet (300 mg total) by mouth daily. 30 tablet 2  . clonazePAM (KLONOPIN) 2 MG tablet Take 1 tablet (2 mg total) by mouth at bedtime. 30 tablet 2  . cyclobenzaprine (FLEXERIL) 10 MG tablet Take 20 mg by mouth at bedtime.     [name] Kitchen HYDROcodone-acetaminophen (NORCO/VICODIN) 5-325 MG tablet Take 2 tablets by mouth every 4 (four) hours as needed. 10 tablet 0  . ibuprofen (ADVIL,MOTRIN)  200 MG tablet Take 800 mg by mouth every 6 (six) hours as needed for moderate pain.     [name] Kitchen lithium carbonate 300 MG capsule Take 1 in am and 2 at bed time 90 capsule 2  . OLANZapine (ZYPREXA) 10 MG tablet TAKE 1 TABLET(10 MG) BY MOUTH AT BEDTIME 30 tablet 2  . oxymetazoline (AFRIN) 0.05 % nasal spray Place 1 spray into both nostrils 2 (two) times daily as needed for congestion.    . SUMAtriptan (IMITREX) 25 MG tablet Take 1 tablet (25 mg total) by mouth every 2 (two) hours as needed for migraine or headache. May repeat in 2 hours if headache persists or recurs. 10 tablet 0   No current facility-administered medications on file prior to visit.     Social History   Social History  . Marital status: Single    Spouse name: N/A  . Number of children: N/A  . Years of education: N/A   Occupational History  . Not on file.   Social History Main Topics  . Smoking status: Never Smoker  . Smokeless tobacco: Never Used     Comment: No smoking hx; no need for cessation materials  . Alcohol use 0.0 oz/week     Comment: One drink a week at most; rarely drinks  . Drug use: No  .  Sexual activity: No   Other Topics Concern  . Not on file   Social History Narrative  . No narrative on file    Family History  Problem Relation Age of Onset  . Alcohol abuse Father   . Alcohol abuse Brother   . Depression Mother   . Alcohol abuse Mother     LMP 06/23/2016      Objective:   Physical Exam  Constitutional: She is oriented to person, place, and time. She appears well-developed and well-nourished.  HENT:  Head: Normocephalic and atraumatic.  Eyes: Conjunctivae and EOM are normal. Pupils are equal, round, and reactive to light.  Neck: Normal range of motion. Neck supple.  Cardiovascular: Normal rate, regular rhythm and intact distal pulses.   Pulmonary/Chest: Effort normal.  Abdominal: Soft.  Musculoskeletal: She exhibits tenderness (Large dorsal hematoma of the hand and slightly of the left  wrist.  Contracture of the little, ring and long fingers left hand.  Bruising of the palm.  Right side negative.  Left wrist good motion but tender.).  Neurological: She is alert and oriented to person, place, and time. She displays normal reflexes. No cranial nerve deficit. She exhibits abnormal muscle tone. Coordination (Left sided weakness and partial contracture of the left hand.) abnormal.  Skin: Skin is warm and dry.  Psychiatric: She has a normal mood and affect. Her behavior is normal. Judgment and thought content normal.          Assessment & Plan:   Encounter Diagnosis  Name Primary?  . Traumatic hematoma of left hand, initial encounter Yes   A cock-up splint was given.  Instructions on Contrast Baths given.  Elevate hand after the contrast baths.  Return in two weeks.  Call if any problem  Consider x-rays of the hand if pain continues on return visit.  Electronically Signed Darreld Mclean, MD 8/2/201711:30 AM

## 2016-07-15 ENCOUNTER — Ambulatory Visit (INDEPENDENT_AMBULATORY_CARE_PROVIDER_SITE_OTHER): Payer: Medicare Other | Admitting: Clinical

## 2016-07-15 DIAGNOSIS — F4310 Post-traumatic stress disorder, unspecified: Secondary | ICD-10-CM | POA: Diagnosis not present

## 2016-07-15 DIAGNOSIS — F332 Major depressive disorder, recurrent severe without psychotic features: Secondary | ICD-10-CM

## 2016-07-15 NOTE — Progress Notes (Signed)
   THERAPIST PROGRESS NOTE  Session Time: 1:30 - 2:28  Participation Level: Active  Behavioral Response: CasualAlertNA and Depressed  Type of Therapy: Individual Therapy  Treatment Goals addressed: improve psychiatric symptoms, elevate mood (improve self-esteem, increase hopefulness, decrease suicidal thoughts, decrease self-harming behavior), improve unhelpful thought patterns, decrease irrational worries,   Interventions: CBT and Motivational Interviewing,   Summary: Natasha [name] is a 45 y.o. female who presents with Major Depressive Disorder, recurrent, severe without psychotic features and PTSD   Suicidal/Homicidal: No -without intent/plan  Therapist Response:  Emil met with clinician for an individual session. Tonetta discussed her psychiatric symptoms, her current life events, and her homework. [name] shared that she had some days she felt okay and some days that she felt depressed. She denied any self harming behavior since last session. Client and clinician discussed healthier alternative coping skills. [name] shared that she continued to work on challenging and changing her belief that her life has no purpose. She shared that she was able to do so half the days. She shared that she worked on walking more but felt like a failure because she relied on her mother to be next to her most of the days. Client and clinician discussed thoughts and perspective. Client and clinician discussed the possibility of viewing her progress as a success. Clinician asked open ended questions and [name] identified other activities she became successful in through progress (whichn included set backs). Client and clinician discussed how to apply the principle to her efforts to change belief and also to walk again. Neville repeatedly said that doing so was hard. Clinician asked open ended questions and [name] had the insight that  the things she is most proud of and were worth while were hard  (challenging) until they were not. [name] shared that she felt much better at end of session and willing to work on her beliefs and walking until next session. She also agreed to do her grounding and mindfulness techniques.   Plan: Return again in 1 weeks.  Diagnosis: Axis I: Major Depressive Disorder, recurrent, severe without psychotic features and PTSD    [name] A, LCSW 07/15/2016

## 2016-07-16 ENCOUNTER — Encounter (HOSPITAL_COMMUNITY): Payer: Self-pay | Admitting: Clinical

## 2016-07-22 ENCOUNTER — Ambulatory Visit (INDEPENDENT_AMBULATORY_CARE_PROVIDER_SITE_OTHER): Payer: Medicare Other | Admitting: Clinical

## 2016-07-22 ENCOUNTER — Encounter (HOSPITAL_COMMUNITY): Payer: Self-pay | Admitting: Clinical

## 2016-07-22 DIAGNOSIS — F4310 Post-traumatic stress disorder, unspecified: Secondary | ICD-10-CM

## 2016-07-22 DIAGNOSIS — F332 Major depressive disorder, recurrent severe without psychotic features: Secondary | ICD-10-CM | POA: Diagnosis not present

## 2016-07-22 NOTE — Progress Notes (Signed)
  THERAPIST PROGRESS NOTE  Session Time: 1:30 - 2: 28  Participation Level: Active  Behavioral Response: casual/Alert/Anxious  Interventions: CBT and Motivational Interviewing, Grounding  Techniques,   Summary: Natasha [name] is a 45 y.o. female who presents with Major Depressive Disorder, recurrent, severe without psychotic features and PTSD   Suicidal/Homicidal: No -without intent/plan  Therapist Response:  Natasha [name] met with clinician for an individual session. Natasha [name] discussed her psychiatric symptoms, her current life events, and her homework. Natasha [name] shared that she had a difficult week. She shared she experienced some passive suicidal thoughts but did not self harm. She denied any current suicidal or homicidal ideation. Clinician asked open ended questions and [name] shared her frustration about her life position. Client and clinician discussed some of her  Her negative thoughts. Client and clinician discussed the evidence for and against the thoughts. Natasha [name] was then able to formulate healthier alternative thoughts. Natasha [name] had the insight that some of the things she was depressed about were goals she didn't achieve. Clinician asked open ended questions and Natasha [name] had the insight that she no longer had an interest in those goals, she was more interested in other ones. Client and clinician discussed her current interest and steps she could take to achieve them. Client and clinician practiced a grounding technique together. [name] agreed to practice her techniques until next session.   Plan: Return again in 1 weeks.  Diagnosis: Axis I: Major Depressive Disorder, recurrent, severe without psychotic features and PTSD    Miran Kautzman A, LCSW 07/22/2016     Eryck Negron A, LCSW 07/22/2016

## 2016-07-28 ENCOUNTER — Encounter: Payer: Self-pay | Admitting: Orthopaedic Surgery

## 2016-07-28 ENCOUNTER — Ambulatory Visit (INDEPENDENT_AMBULATORY_CARE_PROVIDER_SITE_OTHER): Payer: Medicare Other | Admitting: Orthopaedic Surgery

## 2016-07-28 VITALS — BP 113/83 | HR 80 | Temp 98.1°F

## 2016-07-28 DIAGNOSIS — S60222D Contusion of left hand, subsequent encounter: Secondary | ICD-10-CM | POA: Diagnosis not present

## 2016-07-28 NOTE — Patient Instructions (Signed)
Precautions discussed.  

## 2016-07-28 NOTE — Progress Notes (Signed)
Patient WU:JWJXBJY:Natasha Philipp OvensK Wrinkle, female DOB:[birth year], 45 y.o. NWG:[id number]  Chief Complaint  Patient presents with  . Follow-up    Left wrist pain    HPI  [name] is a 45 y.o. female who had traumatic hematoma of the left hand and wrist.  She has been in Cock-up Splint and is much, much better.  She has less pain and much less swelling.  She is post neurological event and has partial paralysis of the left hand. HPI  There is no height or weight on file to calculate BMI.  ROS  Review of Systems  HENT: Negative for congestion.   Respiratory: Negative for cough and shortness of breath.   Cardiovascular: Negative for chest pain and leg swelling.  Endocrine: Positive for cold intolerance.  Musculoskeletal: Positive for arthralgias, gait problem and joint swelling.  Allergic/Immunologic: Positive for environmental allergies.  Neurological: Positive for weakness.    Past Medical History:  Diagnosis Date  . C6 spinal cord injury (HCC)   . Depression     Past Surgical History:  Procedure Laterality Date  . BACK SURGERY      Family History  Problem Relation Age of Onset  . Alcohol abuse Father   . Alcohol abuse Brother   . Depression Mother   . Alcohol abuse Mother     Social History Social History  Substance Use Topics  . Smoking status: Never Smoker  . Smokeless tobacco: Never Used     Comment: No smoking hx; no need for cessation materials  . Alcohol use 0.0 oz/week     Comment: One drink a week at most; rarely drinks    No Known Allergies  Current Outpatient Prescriptions  Medication Sig Dispense Refill  . buPROPion (WELLBUTRIN XL) 300 MG 24 hr tablet Take 1 tablet (300 mg total) by mouth daily. 30 tablet 2  . clonazePAM (KLONOPIN) 2 MG tablet Take 1 tablet (2 mg total) by mouth at bedtime. 30 tablet 2  . cyclobenzaprine (FLEXERIL) 10 MG tablet Take 20 mg by mouth at bedtime.     [name] Kitchen. HYDROcodone-acetaminophen (NORCO/VICODIN) 5-325 MG tablet Take 2 tablets  by mouth every 4 (four) hours as needed. 10 tablet 0  . ibuprofen (ADVIL,MOTRIN) 200 MG tablet Take 800 mg by mouth every 6 (six) hours as needed for moderate pain.     [name] Kitchen. lithium carbonate 300 MG capsule Take 1 in am and 2 at bed time 90 capsule 2  . OLANZapine (ZYPREXA) 10 MG tablet TAKE 1 TABLET(10 MG) BY MOUTH AT BEDTIME 30 tablet 2  . oxymetazoline (AFRIN) 0.05 % nasal spray Place 1 spray into both nostrils 2 (two) times daily as needed for congestion.    . SUMAtriptan (IMITREX) 25 MG tablet Take 1 tablet (25 mg total) by mouth every 2 (two) hours as needed for migraine or headache. May repeat in 2 hours if headache persists or recurs. 10 tablet 0   No current facility-administered medications for this visit.      Physical Exam  Blood pressure 113/83, pulse 80, temperature 98.1 F (36.7 C), last menstrual period 06/23/2016.  Constitutional: overall normal hygiene, normal nutrition, well developed, normal grooming, normal body habitus. Assistive device:braces, wheelchair, cock-up splint left  Musculoskeletal: gait and station Limp in wheelchair, post C-6 injury and has partial paralysis both sides, muscle tone and strength are abnormal,   atrophy is present.  .  Neurological: coordination overall abnormal.  Deep tendon reflex/nerve stretch intact.  Sensation normal.  Cranial nerves II-XII intact.  Skin:   Surgical scars of hands, arm; otherwise overall no scars, lesions, ulcers or rashes. No psoriasis.  Psychiatric: Alert and oriented x 3.  Recent memory intact, remote memory unclear.  Normal mood and affect. Well groomed.  Good eye contact.  Cardiovascular: overall no swelling, no varicosities, no edema bilaterally, normal temperatures of the legs and arms, no clubbing, cyanosis and good capillary refill.  Lymphatic: palpation is normal.  The left hand and wrist show resolving ecchymosis.  There is no swelling today, a big improvement.  She has weakness secondary to the  neurological pre-existing problem but she has no pain today.  Motion is better.  The patient has been educated about the nature of the problem(s) and counseled on treatment options.  The patient appeared to understand what I have discussed and is in agreement with it.  Encounter Diagnosis  Name Primary?  . Traumatic hematoma of hand, left, subsequent encounter Yes    PLAN Call if any problems.  Precautions discussed.  Continue current medications.   Return to clinic PRN   Electronically Signed Darreld McleanWayne Helton Oleson, MD 8/16/20173:08 PM

## 2016-07-29 ENCOUNTER — Ambulatory Visit (INDEPENDENT_AMBULATORY_CARE_PROVIDER_SITE_OTHER): Payer: Medicare Other | Admitting: Clinical

## 2016-07-29 DIAGNOSIS — F332 Major depressive disorder, recurrent severe without psychotic features: Secondary | ICD-10-CM | POA: Diagnosis not present

## 2016-07-29 DIAGNOSIS — F4310 Post-traumatic stress disorder, unspecified: Secondary | ICD-10-CM | POA: Diagnosis not present

## 2016-07-29 NOTE — Progress Notes (Signed)
THERAPIST PROGRESS NOTE  Session Time: 1:30 - 2:25  Participation Level: Active  Behavioral Response: CasualAlertDepressed  Type of Therapy: Individual Therapy  Treatment Goals addressed: improve psychiatric symptoms, elevate mood, improve self-esteem, increase hopefulness, improve unhelpful thought patterns, decrease irrational worries,   Interventions: CBT and Motivational Interviewing, Grounding and Mindfulness Techniques  Summary: Natasha [name] is [name] 46 y.o. female who presents with Major Depressive Disorder, recurrent, severe without psychotic features and PTSD   Suicidal/Homicidal: No -without intent/plan  Therapist Response:  Natasha [name] met with clinician for an individual session. Natasha [name] discussed her psychiatric symptoms and her current life events. Natasha [name] shared that she had [name] difficult week with her symptoms. She shared that she had been depressed and frustrated at home. She shared that she had some yelling outburst. She denied any suicidal or homicidal ideation or self harming behaviors. Client and clinician discussed the events that led up to her outburst. Client and clinician discussed her negative thoughts and emotions. Client and clinician discussed the evidence for and against the thoughts. Client and clinician discussed healthier alternative thoughts and healthier alternative ways to view her situations. Client and clinician discussed how her approach to situations might change if she viewed things through the alternative healthier perspective. Client and clinician discussed her desire to walk more. [name] used to work with  horses. Clinician asked open ended questions about techniques she would use to address fear or change bad habbits in the horse. [name] identified some techniques. Clinician and [name] shared how she could use these same behavioral techniques to improve her fear and habits.    Plan: Return again in 1 weeks.  Diagnosis: Axis I: Major Depressive  Disorder, recurrent, severe without psychotic features and PTSD    Natasha Barron A, LCSW 07/29/2016

## 2016-08-04 ENCOUNTER — Encounter (HOSPITAL_COMMUNITY): Payer: Self-pay | Admitting: Clinical

## 2016-08-05 ENCOUNTER — Encounter (HOSPITAL_COMMUNITY): Payer: Self-pay | Admitting: Clinical

## 2016-08-05 ENCOUNTER — Ambulatory Visit (INDEPENDENT_AMBULATORY_CARE_PROVIDER_SITE_OTHER): Payer: Medicare Other | Admitting: Clinical

## 2016-08-05 DIAGNOSIS — F4310 Post-traumatic stress disorder, unspecified: Secondary | ICD-10-CM

## 2016-08-05 DIAGNOSIS — F332 Major depressive disorder, recurrent severe without psychotic features: Secondary | ICD-10-CM

## 2016-08-05 NOTE — Progress Notes (Signed)
   THERAPIST PROGRESS NOTE  Session Time: 1:32 -2:28  Participation Level: Active  Behavioral Response: CasualAlertDepressed  Type of Therapy: Individual Therapy  Treatment Goals addressed: improve psychiatric symptoms, elevate mood (improve self-esteem, increase hopefulness, decrease suicidal thoughts, decrease self-harming behavior), improve unhelpful thought patterns, decrease irrational worries, discuss and process trauma  Interventions: CBT and Motivational Interviewing, Grounding and Mindfulness Techniques  Summary: Natasha [name] is a 45 y.o. female who presents with Major Depressive Disorder, recurrent, severe without psychotic features and PTSD   Suicidal/Homicidal: No -without intent/plan  Therapist Response:  [name] met with clinician for an individual session. [name] discussed her psychiatric symptoms, her current life events, and her homework. [name] shared that she felt good some of the time and had been improving her walking(a personal goal). She stated that she had read of a friend who was doing well in life and it made her feel bad about herself. She stated that she then overdosed on Klonopin and was revived by EMTs after her mother called them. Client and clinician discussed her negative thoughts at the time. Client and clinician discussed the evidence for and against her negative thoughts. [name] and clinician discussed healthier alternative thoughts. Client and clinician discussed an alternative way to view others doing well in the world. [name] had the insight that her friends success reminded her that she would like to be doing something. Client and clinician discussed the kind of things she would like to do. Client and clinician discussed the process of doing those things. Client and clinician discussed her ability to make progress in this direction. [name] identified some steps she would be willing to take. Client and clinician discussed alternative strategies to  dealing with negative thoughts when they show up such as grounding and mindfulness, focusing else where, challenging negative thoughts, doing something for someone else. [name] denied any current suicidal or homicidal ideation.  Plan: Return again in 1 weeks.  Diagnosis: Axis I: Major Depressive Disorder, recurrent, severe without psychotic features and PTSD    [name] Vorce A, LCSW 08/05/2016

## 2016-08-12 ENCOUNTER — Ambulatory Visit (INDEPENDENT_AMBULATORY_CARE_PROVIDER_SITE_OTHER): Payer: Medicare Other | Admitting: Clinical

## 2016-08-12 ENCOUNTER — Encounter (HOSPITAL_COMMUNITY): Payer: Self-pay

## 2016-08-12 ENCOUNTER — Emergency Department (HOSPITAL_COMMUNITY)
Admission: EM | Admit: 2016-08-12 | Discharge: 2016-08-13 | Disposition: A | Discharge status: Other Acute Inpatient Hospital | Payer: Medicare Other | Attending: Emergency Medicine | Admitting: Emergency Medicine

## 2016-08-12 ENCOUNTER — Ambulatory Visit (HOSPITAL_COMMUNITY)
Admission: RE | Admit: 2016-08-12 | Discharge: 2016-08-12 | Disposition: A | Discharge status: Home / Self Care | Payer: Medicare Other | Attending: Psychiatry | Admitting: Psychiatry

## 2016-08-12 DIAGNOSIS — R45851 Suicidal ideations: Secondary | ICD-10-CM | POA: Insufficient documentation

## 2016-08-12 DIAGNOSIS — F4310 Post-traumatic stress disorder, unspecified: Secondary | ICD-10-CM

## 2016-08-12 DIAGNOSIS — F332 Major depressive disorder, recurrent severe without psychotic features: Secondary | ICD-10-CM | POA: Diagnosis present

## 2016-08-12 DIAGNOSIS — Z791 Long term (current) use of non-steroidal anti-inflammatories (NSAID): Secondary | ICD-10-CM | POA: Insufficient documentation

## 2016-08-12 DIAGNOSIS — Z79899 Other long term (current) drug therapy: Secondary | ICD-10-CM | POA: Insufficient documentation

## 2016-08-12 LAB — COMPREHENSIVE METABOLIC PANEL
ALT: 16 U/L (ref 14–54)
AST: 19 U/L (ref 15–41)
Albumin: 4.3 g/dL (ref 3.5–5.0)
Alkaline Phosphatase: 76 U/L (ref 38–126)
Anion gap: 6 (ref 5–15)
BUN: 10 mg/dL (ref 6–20)
CO2: 23 mmol/L (ref 22–32)
Calcium: 9.6 mg/dL (ref 8.9–10.3)
Chloride: 111 mmol/L (ref 101–111)
Creatinine, Ser: 0.72 mg/dL (ref 0.44–1.00)
GFR calc Af Amer: >60 mL/min (ref >60)
GFR calc non Af Amer: >60 mL/min (ref >60)
Glucose, Bld: 87 mg/dL (ref 65–99)
Potassium: 3.9 mmol/L (ref 3.5–5.1)
Sodium: 140 mmol/L (ref 135–145)
Total Bilirubin: 0.3 mg/dL (ref 0.3–1.2)
Total Protein: 7.6 g/dL (ref 6.5–8.1)

## 2016-08-12 LAB — CBC
HCT: 42.7 % (ref 36.0–46.0)
Hemoglobin: 14.3 g/dL (ref 12.0–15.0)
MCH: 29.4 pg (ref 26.0–34.0)
MCHC: 33.5 g/dL (ref 30.0–36.0)
MCV: 87.7 fL (ref 78.0–100.0)
Platelets: 266 10*3/uL (ref 150–400)
RBC: 4.87 MIL/uL (ref 3.87–5.11)
RDW: 13.8 % (ref 11.5–15.5)
WBC: 7.5 10*3/uL (ref 4.0–10.5)

## 2016-08-12 LAB — SALICYLATE LEVEL: Salicylate Lvl: <4 mg/dL (ref 2.8–30.0)

## 2016-08-12 LAB — ETHANOL: Alcohol, Ethyl (B): <5 mg/dL (ref <5)

## 2016-08-12 LAB — ACETAMINOPHEN LEVEL: Acetaminophen (Tylenol), Serum: <10 ug/mL — ABNORMAL LOW (ref 10–30)

## 2016-08-12 MED ORDER — ZOLPIDEM TARTRATE 5 MG PO TABS: 5.0000 mg | ORAL_TABLET | Freq: Every evening | ORAL | Status: DC | PRN

## 2016-08-12 MED ORDER — ACETAMINOPHEN 325 MG PO TABS: 650.0000 mg | ORAL_TABLET | ORAL | Status: DC | PRN

## 2016-08-12 MED ORDER — ZIPRASIDONE MESYLATE 20 MG IM SOLR: 10.0000 mg | Freq: Once | INTRAMUSCULAR | Status: DC

## 2016-08-12 MED ORDER — ONDANSETRON HCL 4 MG PO TABS: 4.0000 mg | ORAL_TABLET | Freq: Three times a day (TID) | ORAL | Status: DC | PRN

## 2016-08-12 NOTE — Progress Notes (Signed)
Patient presenting to Ed with SI with plan to overdose or cut her wrists. Patient listed as having Medicare insurance. EDCM spoke to patient at bedside. Patient confirms her pcp is Dr. Sherwood GamblerFusco.

## 2016-08-12 NOTE — ED Notes (Signed)
Bed: [facility] Expected date:  Expected time:  Means of arrival:  Comments: Hold for triage 4 

## 2016-08-12 NOTE — ED Notes (Addendum)
Pt has jeans,bra,white shirt,brown shirt. Locked in locker 29 in TCU
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
## 2016-08-12 NOTE — ED Notes (Signed)
Pt attempted to get urine sample but stated she was unable at this time

## 2016-08-12 NOTE — ED Provider Notes (Signed)
[facility]-EMERGENCY DEPT Provider Note   CSN: [id number] Arrival date & time: 08/12/16  1641     History   Chief Complaint Chief Complaint  Patient presents with  . Suicidal    HPI Natasha [name] is a 45 y.o. female.  HPI Pt with hx of depression comes in with cc of SI. Pt reports that her depression is getting worse, despite her taking her meds as prescribed, and now she is having SI. She hasnt needed admission in 1 year to psych. She denies any new stressors. Pt would want to OD. She denies overdosing prior to ER arrival. Pt denies nausea, emesis, fevers, chills, chest pains, shortness of breath, headaches, abdominal pain, uti like symptoms.   Past Medical History:  Diagnosis Date  . C6 spinal cord injury (HCC)   . Depression     Patient Active Problem List   Diagnosis Date Noted  . MDD (major depressive disorder) (HCC) 10/02/2015  . Severe recurrent major depression without psychotic features (HCC) 06/19/2015    Past Surgical History:  Procedure Laterality Date  . BACK SURGERY      OB History    No data available       Home Medications    Prior to Admission medications   Medication Sig Start Date End Date Taking? Authorizing Provider  buPROPion (WELLBUTRIN XL) 300 MG 24 hr tablet Take 1 tablet (300 mg total) by mouth daily. 06/09/16  Yes Cleotis NipperSyed T Arfeen, MD  clonazePAM (KLONOPIN) 2 MG tablet Take 1 tablet (2 mg total) by mouth at bedtime. 06/09/16  Yes Cleotis NipperSyed T Arfeen, MD  cyclobenzaprine (FLEXERIL) 10 MG tablet Take 20 mg by mouth at bedtime.  06/26/15  Yes Historical Provider, MD  ibuprofen (ADVIL,MOTRIN) 200 MG tablet Take 800 mg by mouth every 6 (six) hours as needed for moderate pain.    Yes Historical Provider, MD  lithium carbonate 300 MG capsule Take 1 in am and 2 at bed time 06/09/16  Yes Cleotis NipperSyed T Arfeen, MD  OLANZapine (ZYPREXA) 10 MG tablet TAKE 1 TABLET(10 MG) BY MOUTH AT BEDTIME 06/09/16  Yes Cleotis NipperSyed T Arfeen, MD  oxymetazoline (AFRIN) 0.05 % nasal spray Place  1 spray into both nostrils 2 (two) times daily as needed for congestion.   Yes Historical Provider, MD  SUMAtriptan (IMITREX) 25 MG tablet Take 1 tablet (25 mg total) by mouth every 2 (two) hours as needed for migraine or headache. May repeat in 2 hours if headache persists or recurs. 09/25/15  Yes Beau FannyJohn C Withrow, FNP  HYDROcodone-acetaminophen (NORCO/VICODIN) 5-325 MG tablet Take 2 tablets by mouth every 4 (four) hours as needed. Patient not taking: Reported on 08/12/2016 07/07/16   Rolland PorterMark James, MD    Family History Family History  Problem Relation Age of Onset  . Alcohol abuse Father   . Alcohol abuse Brother   . Depression Mother   . Alcohol abuse Mother     Social History Social History  Substance Use Topics  . Smoking status: Never Smoker  . Smokeless tobacco: Never Used     Comment: No smoking hx; no need for cessation materials  . Alcohol use 0.0 oz/week     Comment: One drink a week at most; rarely drinks     Allergies   Review of patient's allergies indicates no known allergies.   Review of Systems Review of Systems  ROS 10 Systems reviewed and are negative for acute change except as noted in the HPI.     Physical Exam Updated  Vital Signs BP 113/88 (BP Location: Left Arm)   Pulse 88   Temp 97.7 F (36.5 C) (Oral)   Resp 16   Ht 5' (1.524 m)   Wt 140 lb (63.5 kg)   LMP 08/05/2016 (Approximate)   SpO2 98%   BMI 27.34 kg/m   Physical Exam Physical Exam  Constitutional: He appears well-developed.  HENT:  Head: Atraumatic.  Neck: Neck supple.  Cardiovascular: Normal rate.   Pulmonary/Chest: Effort normal.  Neurological: He is alert.  Skin: Skin is warm.  Nursing note and vitals reviewed. Psych: flat affect  ED Treatments / Results  Labs (all labs ordered are listed, but only abnormal results are displayed) Labs Reviewed  ACETAMINOPHEN LEVEL - Abnormal; Notable for the following:       Result Value   Acetaminophen (Tylenol), Serum <10 (*)     All other components within normal limits  COMPREHENSIVE METABOLIC PANEL  ETHANOL  SALICYLATE LEVEL  CBC  URINE RAPID DRUG SCREEN, HOSP PERFORMED    EKG  EKG Interpretation None       Radiology No results found.  Procedures Procedures (including critical care time)  Medications Ordered in ED Medications  acetaminophen (TYLENOL) tablet 650 mg (not administered)  ziprasidone (GEODON) injection 10 mg (not administered)  ondansetron (ZOFRAN) tablet 4 mg (not administered)  zolpidem (AMBIEN) tablet 5 mg (not administered)     Initial Impression / Assessment and Plan / ED Course  I have reviewed the triage vital signs and the nursing notes.  Pertinent labs & imaging results that were available during my care of the patient were reviewed by me and considered in my medical decision making (see chart for details).  Clinical Course    Pt is medically cleared for psych to assess for SI and worsening depression.  Final Clinical Impressions(s) / ED Diagnoses   Final diagnoses:  Suicidal ideation    New Prescriptions New Prescriptions   No medications on file     Derwood Kaplan, MD 08/12/16 1924

## 2016-08-12 NOTE — Progress Notes (Signed)
THERAPIST PROGRESS NOTE  Session Time: 1:30 - 2:10  Participation Level: Active  Behavioral Response: NeatAlertDepressed  Type of Therapy: Individual Therapy  Treatment Goals addressed: Crisis care improve psychiatric symptoms, elevate mood (decrease suicidal thoughts, decrease self-harming behavior), improve unhelpful thought patterns,   Interventions: CBT and Motivational Interviewing, Grounding and Mindfulness Techniques  Summary: Juliet K Hauger is a 45 y.o. female who presents with Major Depressive Disorder, recurrent, severe without psychotic features and PTSD   Suicidal/Homicidal: No -without intent/plan  Therapist Response:  Jelena met with clinician for an individual session. Rubina discussed her psychiatric symptoms and her current life events. Cloria shared that she has been struggling this past week. She denied any self harm but shared that she has really been struggling. Clinician asked open ended questions and [name] shared that she was battling suicidal thoughts and that it was a big struggle. Clinician asked if she had a plan Angelina shared that she was thinking a bout pills and cutting herself. Clinician asked a bout going inpatient. Dhrithi was concerned that her mother would not be able take care of herself she did. Clinician invited [name]'s mother into the room. Admire's mother agreed to get help from friends so that [name] could get the help she needs. Clinician notified Regan ( Nurse) and  Rita took Aleyda upstairs to be evaluated to be admitted.    Plan: Return again in 1 weeks.  Diagnosis: Axis I: Major Depressive Disorder, recurrent, severe without psychotic features and PTSD    , A, LCSW 08/12/2016  

## 2016-08-12 NOTE — ED Notes (Signed)
Pt. Stated she was able to give urine.

## 2016-08-12 NOTE — ED Triage Notes (Signed)
Pt presents with c/o SI. Pt has a hx of suicide attempt in 2002. Pt reports she was planning on overdosing in order to kill herself or cut her wrists. Pt walks with an assistive device.

## 2016-08-12 NOTE — ED Notes (Signed)
Pt wanded by security and changed into purple scrubs. 

## 2016-08-13 DIAGNOSIS — R45851 Suicidal ideations: Secondary | ICD-10-CM | POA: Diagnosis present

## 2016-08-13 DIAGNOSIS — Z79899 Other long term (current) drug therapy: Secondary | ICD-10-CM | POA: Diagnosis not present

## 2016-08-13 DIAGNOSIS — F332 Major depressive disorder, recurrent severe without psychotic features: Secondary | ICD-10-CM | POA: Diagnosis not present

## 2016-08-13 DIAGNOSIS — Z791 Long term (current) use of non-steroidal anti-inflammatories (NSAID): Secondary | ICD-10-CM | POA: Diagnosis not present

## 2016-08-13 LAB — RAPID URINE DRUG SCREEN, HOSP PERFORMED
Amphetamines: NOT DETECTED
Barbiturates: NOT DETECTED
Benzodiazepines: NOT DETECTED
Cocaine: NOT DETECTED
Opiates: NOT DETECTED
Tetrahydrocannabinol: NOT DETECTED

## 2016-08-13 MED ORDER — BUPROPION HCL ER (XL) 150 MG PO TB24
300.0000 mg | ORAL_TABLET | Freq: Every day | ORAL | Status: DC
  Administered 2016-08-13: 300 mg via ORAL
  Filled 2016-08-13: qty 2

## 2016-08-13 MED ORDER — IBUPROFEN 800 MG PO TABS: 800.0000 mg | ORAL_TABLET | Freq: Four times a day (QID) | ORAL | Status: DC | PRN

## 2016-08-13 MED ORDER — CLONAZEPAM 0.5 MG PO TABS
2.0000 mg | ORAL_TABLET | Freq: Every day | ORAL | Status: DC
Start: 2016-08-13 — End: 2016-08-13

## 2016-08-13 MED ORDER — LITHIUM CARBONATE 300 MG PO CAPS
300.0000 mg | ORAL_CAPSULE | Freq: Two times a day (BID) | ORAL | Status: DC
Start: 2016-08-13 — End: 2016-08-13
  Administered 2016-08-13: 300 mg via ORAL
  Filled 2016-08-13: qty 1

## 2016-08-13 MED ORDER — OLANZAPINE 10 MG PO TABS: 10.0000 mg | ORAL_TABLET | Freq: Every day | ORAL | Status: DC

## 2016-08-13 NOTE — BH Assessment (Signed)
[facility] Assessment Progress Note  Per Fredna Dowakia, NP this pt requires psychiatric hospitalization at this time.  At 09:13 Christiane HaJonathan [facility].  Pt has been accepted to their facility by Dr Wendall StadeKohl.  Nehemiah MassedFernando Cobos, MD, concurs with this decision.  Pt is currently under voluntary status, and she also agrees to transfer.  Pt's nurse has been notified, and agrees to call report to [phone].  [facility].  Laylanie Kruczek, MA Triage Specialist [phone]

## 2016-08-13 NOTE — ED Notes (Signed)
Pt attempted urine but unsuccessful.

## 2016-08-13 NOTE — ED Notes (Signed)
Bed: WA33 Expected date:  Expected time:  Means of arrival:  Comments: 

## 2016-08-13 NOTE — BH Assessment (Signed)
Patient has been referred to the following facilities for treatment:  [facility] - [phone] [facility] [phone] [facility] - [phone] [facility] - [phone] [name] - [phone]  Natasha PokeJoVea [name], LCSW Therapeutic Triage Specialist [facility] 08/13/2016 6:05 AM

## 2016-08-13 NOTE — ED Notes (Signed)
Psychiatrist at bedside conversing with patient.

## 2016-08-13 NOTE — ED Notes (Signed)
[facility] [place] [facility].  [facility] called for transport.

## 2016-08-13 NOTE — ED Notes (Signed)
[facility] has [facility] to [facility] and [facility].

## 2016-08-13 NOTE — ED Notes (Signed)
Patient moved from ED to TCU bed 30.  Alert and oriented.  Sitter at bedside.  Obtaining urine for drug screen.  Patient is calm and cooperative.  Walks with assistive divide from failed suicide attempt.  Long history of anxiety and depression.

## 2016-08-18 ENCOUNTER — Encounter (HOSPITAL_COMMUNITY): Payer: Self-pay | Admitting: Clinical

## 2016-08-19 ENCOUNTER — Ambulatory Visit (HOSPITAL_COMMUNITY): Payer: Self-pay | Admitting: Clinical

## 2016-08-26 ENCOUNTER — Ambulatory Visit (HOSPITAL_COMMUNITY): Payer: Self-pay | Admitting: Clinical

## 2016-09-02 ENCOUNTER — Ambulatory Visit (INDEPENDENT_AMBULATORY_CARE_PROVIDER_SITE_OTHER): Payer: Medicare Other | Admitting: Clinical

## 2016-09-02 DIAGNOSIS — F332 Major depressive disorder, recurrent severe without psychotic features: Secondary | ICD-10-CM

## 2016-09-02 DIAGNOSIS — F4310 Post-traumatic stress disorder, unspecified: Secondary | ICD-10-CM

## 2016-09-09 ENCOUNTER — Encounter (HOSPITAL_COMMUNITY): Payer: Self-pay | Admitting: Clinical

## 2016-09-09 ENCOUNTER — Ambulatory Visit (INDEPENDENT_AMBULATORY_CARE_PROVIDER_SITE_OTHER): Payer: Medicare Other | Admitting: Psychiatry

## 2016-09-09 DIAGNOSIS — F331 Major depressive disorder, recurrent, moderate: Secondary | ICD-10-CM | POA: Diagnosis not present

## 2016-09-09 MED ORDER — FLUOXETINE HCL 20 MG PO CAPS: 60.0000 mg | ORAL_CAPSULE | Freq: Every day | ORAL | 2 refills | Status: DC

## 2016-09-09 MED ORDER — LITHIUM CARBONATE 300 MG PO CAPS: ORAL_CAPSULE | ORAL | 2 refills | Status: DC

## 2016-09-09 MED ORDER — CLONAZEPAM 2 MG PO TABS: 2.0000 mg | ORAL_TABLET | Freq: Every day | ORAL | 2 refills | Status: DC

## 2016-09-09 MED ORDER — OLANZAPINE 10 MG PO TABS: ORAL_TABLET | ORAL | 2 refills | Status: DC

## 2016-09-09 NOTE — Progress Notes (Signed)
   THERAPIST PROGRESS NOTE  Session Time: 1:35 -2:33  Participation Level: Active  Behavioral Response: CasualAlertNA  Type of Therapy: Individual Therapy  Treatment Goals addressed: improve psychiatric symptoms, elevate mood , increase hopefulness, decrease suicidal thoughts, decrease self-harming behavior, improve unhelpful thought patterns,  Interventions: CBT and Motivational Interviewing, Grounding and Mindfulness Techniques  Summary: Natasha [name] is a 45 y.o. female who presents with Major Depressive Disorder, recurrent, severe without psychotic features and PTSD   Suicidal/Homicidal: No -without intent/plan  Therapist Response:  Macon met with clinician for an individual session. Truth discussed her psychiatric symptoms and  her current life events. At last session June her had shared that she was suicidal and clinician and staff helped her to be admitted into the hospital. [name] shared that after evaluation she spent 2 nights in the hospital and then was transferred to old [facility] and stayed there for 2 weeks. With a smile on her face she shared that she is not feeling as destructive today and is glad that she went to the hospital and to inpatient care. [name] shared that she felt interested in things and felt like her thoughts were more her own clinician asked open ended questions and [name] identified the direction she would like to move. She shared that that she would like to continue practicing her walking and she would like to be more active. Client and clinician reviewed some of her grounding and mindfulness techniques. clinician and client discussed CBT and practicing those techniques to  Help her in continued to improve her thought patterns. [name] shared some of the challenging thought she is has had. Clinician asked open ended questions and [name] was able to identify ways she could improve her thought process.  Plan: Return again in 1 weeks.  Diagnosis: Axis  I: Major Depressive Disorder, recurrent, severe without psychotic features and PTSD    Jasmia Angst A, LCSW 09/02/16
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
## 2016-09-16 ENCOUNTER — Ambulatory Visit (HOSPITAL_COMMUNITY): Payer: Self-pay | Admitting: Clinical

## 2016-09-17 DIAGNOSIS — F332 Major depressive disorder, recurrent severe without psychotic features: Secondary | ICD-10-CM | POA: Diagnosis not present

## 2016-09-17 DIAGNOSIS — F329 Major depressive disorder, single episode, unspecified: Secondary | ICD-10-CM | POA: Diagnosis not present

## 2016-09-23 ENCOUNTER — Ambulatory Visit (INDEPENDENT_AMBULATORY_CARE_PROVIDER_SITE_OTHER): Payer: Medicare Other | Admitting: Clinical

## 2016-09-23 DIAGNOSIS — F332 Major depressive disorder, recurrent severe without psychotic features: Secondary | ICD-10-CM | POA: Diagnosis not present

## 2016-09-23 DIAGNOSIS — F4310 Post-traumatic stress disorder, unspecified: Secondary | ICD-10-CM | POA: Diagnosis not present

## 2016-09-23 NOTE — Progress Notes (Signed)
   THERAPIST PROGRESS NOTE  Session Time: 2:30 - 3:27  Participation Level: Active  Behavioral Response: CasualAlertDepressed  Type of Therapy: Individual Therapy  Treatment Goals addressed: improve psychiatric symptoms, elevate mood (improve self-esteem, increase hopefulness, decrease suicidal thoughts, decrease self-harming behavior), improve unhelpful thought patterns,   Interventions: CBT and Motivational Interviewing,   Summary: Natasha [name] is [name] 45 y.o. female who presents with Major Depressive Disorder, recurrent, severe without psychotic features and PTSD   Suicidal/Homicidal: No -without intent/plan  Therapist Response:  Mileah met with clinician for an individual session. Natasha [name] discussed her psychiatric symptoms and her current life events. Natasha [name] shared that she has not self harmed but she has felt depressed and has had some  Passive suicidal thoughts. She shared that she has been comparing herself to her peers. Clinician asked open ended questions and Natasha [name] shared about some of her comparisons. Clinician asked open ended questions and Natasha [name] explained her comparisons. When asked further she shared that the things she was feeling jealous of were not even things she wanted for herself. Clinician asked open ended questions and Natasha [name] shared she was unsure of what she would like for herself. Client and clinician discussed how she could begin to identified some things she did want for herself. Client and clinician discussed her volunteering. She had tried to at [employer] but her physical limitations made it so she couldn't help with book stacks. Client and clinician discussed alternative options like literacy programs. Client and clinician agreed that she would be happier if she felt like she was helping or contributing (even though she does with her mother). Client and clinician also discussed exercise to help her mood and improve her mobility (independence). Clinician asked  open ended questions and [name] to improve her muscle tone  And improve her mood.    Plan: Return again in 1 weeks.  Diagnosis: Axis I: Major Depressive Disorder, recurrent, severe without psychotic features and PTSD    Natasha Iglesia A, LCSW 09/23/2016

## 2016-09-29 ENCOUNTER — Encounter (HOSPITAL_COMMUNITY): Payer: Self-pay | Admitting: Clinical

## 2016-09-30 ENCOUNTER — Ambulatory Visit (INDEPENDENT_AMBULATORY_CARE_PROVIDER_SITE_OTHER): Payer: Medicare Other | Admitting: Clinical

## 2016-09-30 DIAGNOSIS — F332 Major depressive disorder, recurrent severe without psychotic features: Secondary | ICD-10-CM | POA: Diagnosis not present

## 2016-09-30 DIAGNOSIS — F4310 Post-traumatic stress disorder, unspecified: Secondary | ICD-10-CM

## 2016-10-06 ENCOUNTER — Encounter (HOSPITAL_COMMUNITY): Payer: Self-pay | Admitting: Clinical

## 2016-10-06 NOTE — Progress Notes (Signed)
Type of Therapy: Individual Therapy  Treatment Goals addressed: improve psychiatric symptoms, elevate mood (improve self-esteem, increase hopefulness), improve unhelpful thought patterns, decrease irrational worries,   Interventions: CBT and Motivational Interviewing, Grounding and Mindfulness Techniques  Summary: Natasha [name] is a 45 y.o. female who presents with Major Depressive Disorder, recurrent, severe without psychotic features and PTSD   Suicidal/Homicidal: No -without intent/plan  Therapist Response:  Natasha [name] met with Natasha [name] for an individual session. Natasha [name] discussed her psychiatric symptoms, her current life events, and her homework. Natasha [name] shared that he was depressed but was working on using her skills. She shared one of frustrated things that happened was that she fell 3 times since last session. Natasha [name] asked open ended questions and Natasha [name] shared that she needs to get into physical therapy or figure out a way to exercise. She shared that she went to the wife discussed in last session and they told her that she would need a spotter due to her physical limitations. Natasha [name] shared her negative automatic thoughts a bout having to have somebody watch her. Natasha [name] and Natasha [name] discussed her negative automatic thoughts the evidence for and against the thoughts. Natasha [name] asked open ended questions about her motivation. She shared that her motivation was high because of her desire to have more mobility and be more independent Natasha [name] had the insight that her concerns about what others think or the hassle of doing so. She her out of getting what she desires she stated that she was willing to look into it further. She shared that this would give her a sense of accomplishment. Natasha [name] and Natasha [name] discussed how this might help increase her self-esteem. Natasha [name] denied any suicidal or homicidal ideation. She denied any self harm. Natasha [name] shared that she was ruminating a lot last week and  a bout past mistakes. Natasha [name] and Natasha [name] discussed the stress mistakes. Natasha [name] asked open ended questions and Natasha [name] identified the evidence for and against her negative thoughts about past mistakes. Natasha [name] and Natasha [name] discussed the fact that she was judging her self more harshly then she would judge another. Natasha [name] asked open ended questions and Natasha [name] had the insight that doing so decreased her motivation and contributed to her depression. Natasha [name] and Natasha [name] agreed to discuss these topics further at future sessions.  Plan: Return again in 1 weeks.  Diagnosis: Axis I: Major Depressive Disorder, recurrent, severe without psychotic features and PTSD

## 2016-10-07 ENCOUNTER — Ambulatory Visit (INDEPENDENT_AMBULATORY_CARE_PROVIDER_SITE_OTHER): Payer: Medicare Other | Admitting: Clinical

## 2016-10-07 DIAGNOSIS — F332 Major depressive disorder, recurrent severe without psychotic features: Secondary | ICD-10-CM

## 2016-10-07 DIAGNOSIS — F4310 Post-traumatic stress disorder, unspecified: Secondary | ICD-10-CM

## 2016-10-07 NOTE — Progress Notes (Signed)
   THERAPIST PROGRESS NOTE  Session Time: 1:30 -2:30   Participation Level: Active  Behavioral Response: CasualAlertDepressed  Type of Therapy: Individual Therapy  Treatment Goals addressed: improve psychiatric symptoms, elevate mood (improve self-esteem, increase hopefulness, decrease suicidal thoughts, decrease self-harming behavior), improve unhelpful thought patterns, decrease irrational worries,   Interventions: CBT and Motivational Interviewing, Grounding and Mindfulness Techniques  Summary: Natasha [name] is a 45 y.o. female who presents with Major Depressive Disorder, recurrent, severe without psychotic features and PTSD   Suicidal/Homicidal: No -without intent/plan  Therapist Response:  [name] met with clinician for an individual session. [name] discussed her psychiatric symptoms and her current life events. [name] shared that she has been practicing her grounding and mindfulness techniques. She denied any suicidal or homicidal ideation. She stated that she has been working diligently to improve her negative thoughts. [name] shared that she is having difficulty with come back thoughts - resurfacing negative thoughts. She shared that she has been experiencing a lot of regret and shame about her past. Clinician asked open ended questions and [name] shared about some of the regrets that have been replaying in her mind. Clinician asked open ended questions and [name] shared a bout why the particular events created such guilt and regret. Clinician asked which part of the events created the most regret . Client and clinician discussed her negative thoughts about those parts of the events. Client and clinician discussed the evidence for and against. Client and clinician discussed what the events meant a bout Co[place] as a human being. Client and clinician discussed the evidence for and against her thoughts. Client clinician discussed alternative healthier ways to view the events.  Client and clinician agreed that it would be nice to erase the events but that accepting them as mistakes and opportunities for more growth and understanding would be more beneficial. Client and clinician discussed how to have compassion for herself and her mistakes. Client and clinician discussed the use of grounding and mindfulness.   Plan: Return again in 1 weeks.  Diagnosis: Axis I: Major Depressive Disorder, recurrent, severe without psychotic features and PTSD    Caylin Raby A, LCSW 10/07/2016

## 2016-10-12 ENCOUNTER — Encounter (HOSPITAL_COMMUNITY): Payer: Self-pay | Admitting: Clinical

## 2016-10-13 ENCOUNTER — Ambulatory Visit (INDEPENDENT_AMBULATORY_CARE_PROVIDER_SITE_OTHER): Payer: Medicare Other | Admitting: Clinical

## 2016-10-13 DIAGNOSIS — F4310 Post-traumatic stress disorder, unspecified: Secondary | ICD-10-CM | POA: Diagnosis not present

## 2016-10-13 DIAGNOSIS — F332 Major depressive disorder, recurrent severe without psychotic features: Secondary | ICD-10-CM

## 2016-10-13 NOTE — Progress Notes (Signed)
   THERAPIST PROGRESS NOTE  Session Time: 1:30 - 2:30   Participation Level: Active  Behavioral Response: CasualAlertDepressed  Type of Therapy: Individual Therapy  Treatment Goals addressed: improve psychiatric symptoms, elevate mood (improve self-esteem, increase hopefulness, decrease suicidal thoughts, decrease self-harming behavior), improve unhelpful thought patterns, decrease irrational worries, discuss and process trauma  Interventions: CBT and Motivational Interviewing,   Summary: Natasha [name] is a 45 y.o. female who presents with Major Depressive Disorder, recurrent, severe without psychotic features and PTSD   Suicidal/Homicidal: No -without intent/plan  Therapist Response:  Natasha [name] met with clinician for an individual session. Natasha [name] discussed her psychiatric symptoms and her current life events. Natasha [name] shared that she has been depressed . She shared that she has had a few falls and is walking a lot slower than she had been previously. Natasha [name] is very hard on herself about her lack of speed and confidence in walking. Natasha [name] is very hard on herself and has a difficult time being kind to herself. Natasha [name] and clinician discussed how her negative thoughts about herself make her feel emotionally. Clinician asked open ended questions and Natasha [name] identified alternative ways to view her walking. Natasha [name] also shared about a regret that continues to haunt her (related to her trauma). Clinician asked open ended questions and Natasha [name] shared about the event, her negative thoughts about the event. Natasha [name] and clinician discussed the evidence for and against her negative thoughts. Natasha [name] and clinician discussed the meaning she gave to the event and what she thought it meant about her. Clinician asked open ended questions and Natasha [name] identified the evidence for and against the meaning she assigned and what it meant about her. Natasha [name] and clinician discussed the process of challenging negative  thoughts and emotions. Natasha [name] and clinician discussed the challenge of accepting and alternative belief or thought.   Plan: Return again in 1 weeks.  Diagnosis: Axis I: Major Depressive Disorder, recurrent, severe without psychotic features and PTSD    Kellyann Ordway A, LCSW 10/13/2016

## 2016-10-19 ENCOUNTER — Encounter (HOSPITAL_COMMUNITY): Payer: Self-pay | Admitting: Clinical

## 2016-10-20 DIAGNOSIS — F329 Major depressive disorder, single episode, unspecified: Secondary | ICD-10-CM | POA: Diagnosis not present

## 2016-10-20 DIAGNOSIS — R531 Weakness: Secondary | ICD-10-CM | POA: Diagnosis not present

## 2016-10-20 DIAGNOSIS — F332 Major depressive disorder, recurrent severe without psychotic features: Secondary | ICD-10-CM | POA: Diagnosis not present

## 2016-10-20 DIAGNOSIS — M4322 Fusion of spine, cervical region: Secondary | ICD-10-CM | POA: Diagnosis not present

## 2016-11-02 ENCOUNTER — Ambulatory Visit (INDEPENDENT_AMBULATORY_CARE_PROVIDER_SITE_OTHER): Payer: Medicare Other | Admitting: Clinical

## 2016-11-02 DIAGNOSIS — F332 Major depressive disorder, recurrent severe without psychotic features: Secondary | ICD-10-CM

## 2016-11-02 DIAGNOSIS — F4310 Post-traumatic stress disorder, unspecified: Secondary | ICD-10-CM | POA: Diagnosis not present
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
## 2016-11-02 NOTE — Progress Notes (Signed)
THERAPIST PROGRESS NOTE  Session Time: 3:34 - 4:30  Participation Level: Active  Behavioral Response: CasualAlertDepressed  Type of Therapy: Individual Therapy  Treatment Goals addressed: improve psychiatric symptoms, elevate mood (improve self-esteem, increase hopefulness, decrease suicidal thoughts, decrease self-harming behavior), improve unhelpful thought patterns,   Interventions: CBT and Motivational Interviewing, Grounding and Mindfulness Techniques  Summary: MERCEDEZ BOULE is a 45 y.o. female who presents with Major Depressive Disorder, recurrent, severe without psychotic features and PTSD   Suicidal/Homicidal: No -without intent/plan  Therapist Response:  Larrisha met with clinician for an individual session. Taiesha discussed her psychiatric symptoms and her current life events. [name] and clinician worked together to update her assessment. Yuritza shared that she has had self harming thoughts, though she has not acted on them. Clinician asked open ended questions and [name] shared the negative thoughts that led up to the self harming thoughts. She shared that she beats herself up for not being productive. Clinician asked what she would like to be productive doing. She shared writing. Clinician asked what would happen if when the thoughts came she sat down and used her dictation device to write. She shared then she would be productive. Client and clinician discussed what it would take for [place] to try this when she felt like she wasn't being productive or if she would be willing to set aside a given amount of time to devote to writing. She said she was willing to give it a try. She shared [facility] because her lack of mobility also increases her depression. Clinician asked open ended questions about what it would take to do that as well as what her level of motivation is to do so. Saralyn had the insight that she has the power to  change her mood based on her actions.  Plan: Return again in 1 weeks.  Diagnosis: Axis I: Major Depressive Disorder, recurrent, severe without psychotic features and PTSD    Dally Oshel A, LCSW 11/02/2016

## 2016-11-04 ENCOUNTER — Encounter (HOSPITAL_COMMUNITY): Payer: Self-pay | Admitting: Clinical

## 2016-11-10 ENCOUNTER — Ambulatory Visit (INDEPENDENT_AMBULATORY_CARE_PROVIDER_SITE_OTHER): Payer: Medicare Other | Admitting: Clinical

## 2016-11-10 DIAGNOSIS — F4310 Post-traumatic stress disorder, unspecified: Secondary | ICD-10-CM

## 2016-11-10 DIAGNOSIS — F332 Major depressive disorder, recurrent severe without psychotic features: Secondary | ICD-10-CM

## 2016-11-10 NOTE — Progress Notes (Signed)
   THERAPIST PROGRESS NOTE  Session Time: 2:30 -  Participation Level: Active  Behavioral Response: CasualAlertDepressed  Type of Therapy: Individual Therapy  Treatment Goals addressed: improve psychiatric symptoms, elevate mood (improve self-esteem, increase hopefulness, decrease suicidal thoughts, decrease self-harming behavior), improve unhelpful thought patterns, decrease irrational worries, discuss and process trauma  Interventions: Motivational Interviewing,  Summary: Natasha [name] is a 45 y.o. female who presents with Major Depressive Disorder, recurrent, severe without psychotic features and PTSD   Suicidal/Homicidal: No -without intent/plan  Therapist Response:  Natasha [name] met with clinician for an individual session. Natasha [name] discussed her psychiatric symptoms and  her current life events. Natasha [name] shared that she has had some okay days and some depressive days. Natasha [name] shared that she has been thinking a lot about her trauma  (walking off a parking garage deck).  She shared that she ruminates on what her life would be like if someone had stopped her. Clinician asked open ended questions about what Natasha [name] need to hear to have stopped her. Natasha [name] shared about the potential she had at the time. Clinician asked about her current potential. Natasha [name] identified some of her current potential. Client and clinician discussed her current potential. Client and clinician discussed how she could use her potential. Clinician asked open ended questions and Natasha [name] identified some of her motivations for using her potential. Natasha [name] shared that if she was using some of her potential then she would feel better about herself. Client and clinician discussed the difference in her mood when she thinks about her lost potential and when she thinks about her current potential. Client and clinician discussed the power of choosing her focus. She denied any current suicidal or homicidal ideation  Plan: Return  again in 1 weeks.  Diagnosis: Axis I: Major Depressive Disorder, recurrent, severe without psychotic features and PTSD    Tadd Holtmeyer A, LCSW 11/10/2016

## 2016-11-16 ENCOUNTER — Encounter (HOSPITAL_COMMUNITY): Payer: Self-pay | Admitting: Clinical

## 2016-11-17 ENCOUNTER — Ambulatory Visit (HOSPITAL_COMMUNITY): Payer: Self-pay | Admitting: Clinical

## 2016-11-24 ENCOUNTER — Ambulatory Visit (HOSPITAL_COMMUNITY): Payer: Self-pay | Admitting: Clinical

## 2016-12-01 ENCOUNTER — Ambulatory Visit (HOSPITAL_COMMUNITY): Payer: Self-pay | Admitting: Clinical

## 2016-12-08 ENCOUNTER — Ambulatory Visit (INDEPENDENT_AMBULATORY_CARE_PROVIDER_SITE_OTHER): Payer: Medicare Other | Admitting: Clinical

## 2016-12-08 DIAGNOSIS — F332 Major depressive disorder, recurrent severe without psychotic features: Secondary | ICD-10-CM | POA: Diagnosis not present

## 2016-12-08 DIAGNOSIS — F4310 Post-traumatic stress disorder, unspecified: Secondary | ICD-10-CM

## 2016-12-08 NOTE — Progress Notes (Signed)
   THERAPIST PROGRESS NOTE  Session Time: 2:33 - 3:28  Participation Level: Active  Behavioral Response: CasualAlertDepressed  Type of Therapy: Individual Therapy  Treatment Goals addressed: improve psychiatric symptoms, elevate mood (improve self-esteem, increase hopefulness, decrease suicidal thoughts, decrease self-harming behavior), improve unhelpful thought patterns, decrease irrational worries,   Interventions: CBT and Motivational Interviewing,  Summary: Natasha [name] is [name] 45 y.o. female who presents with Major Depressive Disorder, recurrent, severe without psychotic features and PTSD   Suicidal/Homicidal: No -without intent/plan  Therapist Response:  Natasha [name] met with clinician for an individual session. Natasha [name] discussed her psychiatric symptoms and  her current life events. Natasha [name] that she has been depressed and has had increased trouble walking since her last session. Clinician asked open ended questions and Natasha [name] [name] that she has continued difficulty ruminating over past events. Client and clinician discussed how to interrupt the ruminations. Clinician asked open ended questions and Natasha [name] identified the "benefit" of doing so (for motivation). Clinician asked open ended questions and Natasha [name] had the insight that the ruminating had the opposite effect since it left her feeling bad about her self and that her efforts would never be enough. Client and clinician discussed her perspective on her past events and how they might be seen through the eyes of another. Client and clinician discussed what the "cost" would be if she changed her perspective. Natasha [name] and clinician discussed how to challenge and change perspective. Client and clinician discussed acceptance and self care. Natasha [name] [name] she would feel less destructive if she changed her perspective. Client and clinician agreed to discuss this further at future sessions.  Plan: Return again in 1  weeks.  Diagnosis: Axis I: Major Depressive Disorder, recurrent, severe without psychotic features and PTSD    Natasha Long A, LCSW 12/08/2016

## 2016-12-10 ENCOUNTER — Encounter (HOSPITAL_COMMUNITY): Payer: Self-pay | Admitting: Clinical

## 2016-12-14 ENCOUNTER — Ambulatory Visit (HOSPITAL_COMMUNITY): Payer: Self-pay | Admitting: Psychiatry

## 2016-12-15 ENCOUNTER — Ambulatory Visit (INDEPENDENT_AMBULATORY_CARE_PROVIDER_SITE_OTHER): Payer: Medicare Other | Admitting: Clinical

## 2016-12-15 DIAGNOSIS — F4310 Post-traumatic stress disorder, unspecified: Secondary | ICD-10-CM

## 2016-12-15 DIAGNOSIS — F332 Major depressive disorder, recurrent severe without psychotic features: Secondary | ICD-10-CM

## 2016-12-15 NOTE — Progress Notes (Signed)
   THERAPIST PROGRESS NOTE  Session Time: 2:30 - 3:25  Participation Level: Active  Behavioral Response: CasualAlertDepressed  Type of Therapy: Individual Therapy  Treatment Goals addressed: improve psychiatric symptoms, elevate mood (improve self-esteem, increase hopefulness, decrease suicidal thoughts, decrease self-harming behavior), improve unhelpful thought patterns, decrease irrational worries, discuss and process trauma  Interventions: CBT and Motivational Interviewing,   Summary: Natasha [name] is a 46 y.o. female who presents with Major Depressive Disorder, recurrent, severe without psychotic features and PTSD   Suicidal/Homicidal: No -without intent/plan  Therapist Response:  Hamsini met with clinician for an individual session. [name] discussed her psychiatric symptoms and  her current life events. [name] shared she was depressed but did not self harm or have suicidal ideation. [name] shared that she has been working to interrupt her ruminations as discussed in last session. Client and clinician discussed her progress, her success and the areas with room for improvement. [name] shared that she was working on challenging and changing her perspective (thoughts about her past traumas), she shared her successes and her perceived failures. Client and clinician discussed her "failures" and the negative thoughts that blocked her from changing her thoughts. Clinician asked open ended questions and Tiann was able to identify the evidence for and against the thoughts. She had the insight that she had some fear about changing the thoughts. She shared that they feel like they protect her. Clinician again asked open ended questions and she had the insight that they didn't protect her but instead kept her from participating fully in life. Client and clinician discussed her insight and options available if she changed her unhelpful thoughts. Client and clinician agreed to discuss this  further at future sessions. [name] agreed to practice her grounding and mindfulness techniques and to work to choose her focus and report back on her progress next session.    Plan: Return again in 1 weeks.  Diagnosis: Axis I: Major Depressive Disorder, recurrent, severe without psychotic features and PTSD   Yobana Culliton A, LCSW 12/15/2016

## 2016-12-16 ENCOUNTER — Encounter (HOSPITAL_COMMUNITY): Payer: Self-pay | Admitting: Clinical

## 2016-12-22 DIAGNOSIS — F332 Major depressive disorder, recurrent severe without psychotic features: Secondary | ICD-10-CM | POA: Diagnosis not present

## 2016-12-22 DIAGNOSIS — F329 Major depressive disorder, single episode, unspecified: Secondary | ICD-10-CM | POA: Diagnosis not present

## 2016-12-23 ENCOUNTER — Ambulatory Visit (HOSPITAL_COMMUNITY): Payer: Self-pay | Admitting: Clinical

## 2017-01-02 ENCOUNTER — Other Ambulatory Visit (HOSPITAL_COMMUNITY): Payer: Self-pay | Admitting: Psychiatry

## 2017-01-02 DIAGNOSIS — F331 Major depressive disorder, recurrent, moderate: Secondary | ICD-10-CM

## 2017-01-06 ENCOUNTER — Telehealth (HOSPITAL_COMMUNITY): Payer: Self-pay

## 2017-01-06 DIAGNOSIS — F331 Major depressive disorder, recurrent, moderate: Secondary | ICD-10-CM

## 2017-01-06 MED ORDER — CLONAZEPAM 2 MG PO TABS: 2.0000 mg | ORAL_TABLET | Freq: Every day | ORAL | 0 refills | Status: DC

## 2017-01-06 NOTE — Telephone Encounter (Signed)
Called in the Klonopin for 15 day supply

## 2017-01-06 NOTE — Telephone Encounter (Signed)
Medication refill request - [facility] [place] [facility] a [place] of patient's prescribed Clonazepam, last ordered 09/09/16 + 2 refills. Patient no showed 12/14/16 and is rescheduled for 01/18/17.

## 2017-01-06 NOTE — Telephone Encounter (Signed)
We can provide 15 day supply until she see the provider.

## 2017-01-07 ENCOUNTER — Other Ambulatory Visit (HOSPITAL_COMMUNITY): Payer: Self-pay | Admitting: Psychiatry

## 2017-01-07 DIAGNOSIS — F331 Major depressive disorder, recurrent, moderate: Secondary | ICD-10-CM

## 2017-01-12 ENCOUNTER — Other Ambulatory Visit (HOSPITAL_COMMUNITY): Payer: Self-pay | Admitting: Psychiatry

## 2017-01-12 DIAGNOSIS — F331 Major depressive disorder, recurrent, moderate: Secondary | ICD-10-CM

## 2017-01-12 MED ORDER — OLANZAPINE 10 MG PO TABS: ORAL_TABLET | ORAL | 0 refills | Status: DC

## 2017-01-13 ENCOUNTER — Encounter (HOSPITAL_COMMUNITY): Payer: Self-pay | Admitting: Clinical

## 2017-01-13 ENCOUNTER — Ambulatory Visit (INDEPENDENT_AMBULATORY_CARE_PROVIDER_SITE_OTHER): Payer: Medicare Other | Admitting: Clinical

## 2017-01-13 DIAGNOSIS — F332 Major depressive disorder, recurrent severe without psychotic features: Secondary | ICD-10-CM

## 2017-01-13 NOTE — Progress Notes (Signed)
   THERAPIST PROGRESS NOTE  Session Time: 12:02 - 12:58  Participation Level: Active  Behavioral Response: CasualAlertDepressed  Type of Therapy: Individual Therapy  Treatment Goals addressed: improve psychiatric symptoms, elevate mood, decrease suicidal thoughts, decrease self-harming behavior), improve unhelpful thought patterns,   Interventions: CBT and Motivational Interviewing,   Summary: DELAINIE CHAVANA is a 46 y.o. female who presents with Major Depressive Disorder, recurrent, severe without psychotic features and PTSD   Suicidal/Homicidal: No -without intent/plan  Therapist Response:  Aaleeyah met with clinician for an individual session. Aubreanna discussed her psychiatric symptoms, her current life events, and her homework. Zakeria shared that she was struggling with her emotions. She said she was (and appeared to be) very depressed. She shared that she missed appointments due to weather and issues with her mother. She shared that she has become overwhelmed with caring for her mother. She said they are working on having help come in. She shared that she has been struggling to focus on healthier thoughts. She shared that she felt guilty when she thought better thoughts. Clinician asked open ended questions and [name] shared that she believed that she was being untruthful if she focused on the thoughts that feel better. Client and clinician discussed the unhelpful belief. Clinician asked her open ended questions and Korinne identified the evidence for and against the thoughts. Client and clinician discussed the healthier alternative thoughts she identified. Client and clinician discussed self punishment. Client and clinician discussed how her belief about it is different than the reality . If I punish my self I will improve versus  The reality  "Makes me feel bad, makes me less likely to try again, discourages, sets stage for additional punishment." Client and clinician discussed healthier  coping skills.    Plan: Return again in 1 weeks.  Diagnosis: Axis I: Major Depressive Disorder, recurrent, severe without psychotic features and PTSD    Kailey Esquilin A, LCSW 01/13/2017

## 2017-01-18 ENCOUNTER — Encounter (HOSPITAL_COMMUNITY): Payer: Self-pay | Admitting: Clinical

## 2017-01-18 ENCOUNTER — Ambulatory Visit (HOSPITAL_COMMUNITY): Payer: Self-pay | Admitting: Psychiatry

## 2017-01-27 ENCOUNTER — Ambulatory Visit (HOSPITAL_COMMUNITY): Payer: Self-pay | Admitting: Psychiatry

## 2017-01-31 DIAGNOSIS — T1490XA Injury, unspecified, initial encounter: Secondary | ICD-10-CM | POA: Diagnosis not present

## 2017-02-01 ENCOUNTER — Ambulatory Visit (HOSPITAL_COMMUNITY): Payer: Self-pay | Admitting: Clinical

## 2017-02-02 ENCOUNTER — Encounter (HOSPITAL_COMMUNITY): Payer: Self-pay | Admitting: Emergency Medicine

## 2017-02-02 ENCOUNTER — Ambulatory Visit (INDEPENDENT_AMBULATORY_CARE_PROVIDER_SITE_OTHER): Payer: Medicare Other | Admitting: Clinical

## 2017-02-02 ENCOUNTER — Inpatient Hospital Stay (HOSPITAL_COMMUNITY)
Admission: AD | Admit: 2017-02-02 | Discharge: 2017-02-16 | DRG: 885 | Disposition: A | Discharge status: Home / Self Care | Payer: Medicare Other | Attending: Psychiatry | Admitting: Psychiatry

## 2017-02-02 ENCOUNTER — Encounter (HOSPITAL_COMMUNITY): Payer: Self-pay | Admitting: Clinical

## 2017-02-02 DIAGNOSIS — Z818 Family history of other mental and behavioral disorders: Secondary | ICD-10-CM | POA: Diagnosis not present

## 2017-02-02 DIAGNOSIS — G4700 Insomnia, unspecified: Secondary | ICD-10-CM | POA: Diagnosis present

## 2017-02-02 DIAGNOSIS — Z811 Family history of alcohol abuse and dependence: Secondary | ICD-10-CM | POA: Diagnosis not present

## 2017-02-02 DIAGNOSIS — Z791 Long term (current) use of non-steroidal anti-inflammatories (NSAID): Secondary | ICD-10-CM | POA: Diagnosis not present

## 2017-02-02 DIAGNOSIS — F4310 Post-traumatic stress disorder, unspecified: Secondary | ICD-10-CM

## 2017-02-02 DIAGNOSIS — F411 Generalized anxiety disorder: Secondary | ICD-10-CM | POA: Diagnosis present

## 2017-02-02 DIAGNOSIS — F332 Major depressive disorder, recurrent severe without psychotic features: Secondary | ICD-10-CM

## 2017-02-02 DIAGNOSIS — Z79899 Other long term (current) drug therapy: Secondary | ICD-10-CM

## 2017-02-02 DIAGNOSIS — Z915 Personal history of self-harm: Secondary | ICD-10-CM | POA: Diagnosis not present

## 2017-02-02 DIAGNOSIS — G8250 Quadriplegia, unspecified: Secondary | ICD-10-CM | POA: Diagnosis present

## 2017-02-02 DIAGNOSIS — R8271 Bacteriuria: Secondary | ICD-10-CM | POA: Diagnosis not present

## 2017-02-02 DIAGNOSIS — R45851 Suicidal ideations: Secondary | ICD-10-CM | POA: Diagnosis present

## 2017-02-02 MED ORDER — TRAZODONE HCL 50 MG PO TABS: 50.0000 mg | ORAL_TABLET | Freq: Every evening | ORAL | Status: DC | PRN

## 2017-02-02 MED ORDER — OLANZAPINE 10 MG PO TBDP
10.0000 mg | ORAL_TABLET | Freq: Every day | ORAL | Status: DC
  Administered 2017-02-02 – 2017-02-03 (×2): 10 mg via ORAL
  Filled 2017-02-02 (×6): qty 1

## 2017-02-02 MED ORDER — FLUOXETINE HCL 20 MG PO CAPS
20.0000 mg | ORAL_CAPSULE | Freq: Every day | ORAL | Status: DC
  Administered 2017-02-03 – 2017-02-04 (×2): 20 mg via ORAL
  Filled 2017-02-02 (×5): qty 1

## 2017-02-02 MED ORDER — MAGNESIUM HYDROXIDE 400 MG/5ML PO SUSP: 30.0000 mL | Freq: Every day | ORAL | Status: DC | PRN

## 2017-02-02 MED ORDER — LITHIUM CARBONATE 300 MG PO CAPS
300.0000 mg | ORAL_CAPSULE | Freq: Two times a day (BID) | ORAL | Status: DC
  Administered 2017-02-02 – 2017-02-03 (×2): 300 mg via ORAL
  Filled 2017-02-02 (×8): qty 1

## 2017-02-02 MED ORDER — CLONAZEPAM 1 MG PO TABS
2.0000 mg | ORAL_TABLET | Freq: Every day | ORAL | Status: DC
  Administered 2017-02-02 – 2017-02-13 (×12): 2 mg via ORAL
  Filled 2017-02-02 (×11): qty 2
  Filled 2017-02-02: qty 3

## 2017-02-02 MED ORDER — ACETAMINOPHEN 325 MG PO TABS
650.0000 mg | ORAL_TABLET | Freq: Four times a day (QID) | ORAL | Status: DC | PRN
  Administered 2017-02-07 – 2017-02-13 (×2): 650 mg via ORAL
  Filled 2017-02-02 (×2): qty 2

## 2017-02-02 MED ORDER — HYDROXYZINE HCL 25 MG PO TABS
25.0000 mg | ORAL_TABLET | Freq: Four times a day (QID) | ORAL | Status: DC | PRN
  Administered 2017-02-04 – 2017-02-06 (×2): 25 mg via ORAL
  Filled 2017-02-02 (×3): qty 1

## 2017-02-02 MED ORDER — ALUM & MAG HYDROXIDE-SIMETH 200-200-20 MG/5ML PO SUSP: 30.0000 mL | ORAL | Status: DC | PRN

## 2017-02-02 MED ORDER — ACETAMINOPHEN 325 MG PO TABS: 650.0000 mg | ORAL_TABLET | Freq: Four times a day (QID) | ORAL | Status: DC | PRN

## 2017-02-02 MED ORDER — CYCLOBENZAPRINE HCL 10 MG PO TABS
10.0000 mg | ORAL_TABLET | Freq: Every day | ORAL | Status: DC
  Administered 2017-02-02: 10 mg via ORAL
  Filled 2017-02-02 (×4): qty 1

## 2017-02-02 NOTE — Progress Notes (Signed)
Patient ID: Melven SartoriusJenifer K Baskin, female   DOB: 06-15-1971, 46 y.o.   MRN: [id number] PER STATE REGULATIONS 482.30  THIS CHART WAS REVIEWED FOR MEDICAL NECESSITY WITH RESPECT TO THE PATIENT'S ADMISSION/DURATION OF STAY.  NEXT REVIEW DATE: 02/06/17 Loura HaltBARBARA Elius Etheredge, RN, BSN CASE MANAGER

## 2017-02-02 NOTE — Tx Team (Addendum)
Initial Treatment Plan 02/02/2017 3:50 PM [name] [id number]    PATIENT STRESSORS: Loss of Pt's pet dog passed away recently   PATIENT STRENGTHS: Ability for insight Average or above average intelligence Communication skills General fund of knowledge   PATIENT IDENTIFIED PROBLEMS: "thoughts of suicide"  "thoughts of self-harm"  depression                 DISCHARGE CRITERIA:  Ability to meet basic life and health needs Adequate post-discharge living arrangements Improved stabilization in mood, thinking, and/or behavior Medical problems require only outpatient monitoring Motivation to continue treatment in a less acute level of care Need for constant or close observation no longer present Reduction of life-threatening or endangering symptoms to within safe limits Safe-care adequate arrangements made Verbal commitment to aftercare and medication compliance  PRELIMINARY DISCHARGE PLAN: Outpatient therapy Return to previous living arrangement  PATIENT/FAMILY INVOLVEMENT: This treatment plan has been presented to and reviewed with the patient, Natasha [name], and/or family member, .  The patient and family have been given the opportunity to ask questions and make suggestions.  Beatrix ShipperWright, Jan Martin, RN 02/02/2017, 3:50 PM

## 2017-02-02 NOTE — Progress Notes (Signed)
   THERAPIST PROGRESS NOTE  Session Time: 12:00 -12:23  Participation Level: Active  Behavioral Response: CasualLethargicAnxious and Depressed  Type of Therapy: Individual Therapy  Treatment Goals addressed: improve psychiatric symptoms, Crisis Care  Interventions:  Motivational Interviewing,  Summary: Natasha [name] is [name] 46 y.o. female who presents with Major Depressive Disorder, recurrent, severe without psychotic features and PTSD   Suicidal/Homicidal: No -without intent/plan  Therapist Response:  Zaya met with clinician for an individual session. Natasha [name] discussed her psychiatric symptoms and her current life events. Natasha [name] shared that she has been having consistent thoughts of suicide. She denied any current self harm but shared that he impulse control was decreasing as her suicidal  thoughts have been increasing. When asked if she had [name] plan she said "she would cut to commit suicide. She does have access and [name] history of doing so. She shared that she her depression had increased in the last few weeks. She shared that she has been increasingly isolating, she has been feeling numb, she has been sleeping more, and her appetite has disappeared. She lives with her mother who has parkinson's disease. They both have limited mobility and Natasha [name] has concerns about what will happen to them if one she to fall or become more limited. Client and clinician discussed briefly about coming up with [name] contingency plan. She shared she recently put [name] dog down and is depressed because it is likely she will need to do the same with her other dog.   Plan: Return again after inpatient treatment  Diagnosis: Axis I: Major Depressive Disorder, recurrent, severe without psychotic features and PTSD    Natasha Mentor A, LCSW 02/02/2017
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
## 2017-02-02 NOTE — Progress Notes (Signed)
Patient ID: Natasha SartoriusJenifer K Trew, female   DOB: 08-Mar-1971, 46 y.o.   MRN: [id number]  Patient was offered fluids, dinner, and/or a supplemental nutrition drink. However, patient refused all of these options.
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
## 2017-02-02 NOTE — Progress Notes (Signed)
Patient ID: Natasha SartoriusJenifer K [name], female   DOB: 1971/03/12, 46 y.o.   MRN: [id number]  Writer introduced self to patient. EKG was performed with no apparent distress. Patient was given a urine specimen cup and instructed to give a urine sample when able. Patient verbalized understanding however has not provided a sample at this time.

## 2017-02-03 LAB — LIPID PANEL
Cholesterol: 214 mg/dL — ABNORMAL HIGH (ref 0–200)
HDL: 42 mg/dL (ref >40)
LDL Cholesterol: 149 mg/dL — ABNORMAL HIGH (ref 0–99)
Total CHOL/HDL Ratio: 5.1 RATIO
Triglycerides: 117 mg/dL (ref <150)
VLDL: 23 mg/dL (ref 0–40)

## 2017-02-03 LAB — COMPREHENSIVE METABOLIC PANEL
ALT: 14 U/L (ref 14–54)
AST: 16 U/L (ref 15–41)
Albumin: 3.9 g/dL (ref 3.5–5.0)
Alkaline Phosphatase: 67 U/L (ref 38–126)
Anion gap: 7 (ref 5–15)
BUN: 14 mg/dL (ref 6–20)
CO2: 24 mmol/L (ref 22–32)
Calcium: 9.6 mg/dL (ref 8.9–10.3)
Chloride: 108 mmol/L (ref 101–111)
Creatinine, Ser: 0.67 mg/dL (ref 0.44–1.00)
GFR calc Af Amer: >60 mL/min (ref >60)
GFR calc non Af Amer: >60 mL/min (ref >60)
Glucose, Bld: 85 mg/dL (ref 65–99)
Potassium: 3.7 mmol/L (ref 3.5–5.1)
Sodium: 139 mmol/L (ref 135–145)
Total Bilirubin: 0.4 mg/dL (ref 0.3–1.2)
Total Protein: 6.9 g/dL (ref 6.5–8.1)

## 2017-02-03 LAB — CBC
HCT: 41.1 % (ref 36.0–46.0)
Hemoglobin: 13.3 g/dL (ref 12.0–15.0)
MCH: 28 pg (ref 26.0–34.0)
MCHC: 32.4 g/dL (ref 30.0–36.0)
MCV: 86.5 fL (ref 78.0–100.0)
Platelets: 245 10*3/uL (ref 150–400)
RBC: 4.75 MIL/uL (ref 3.87–5.11)
RDW: 14.3 % (ref 11.5–15.5)
WBC: 8.1 10*3/uL (ref 4.0–10.5)

## 2017-02-03 LAB — TSH: TSH: 5.474 u[IU]/mL — ABNORMAL HIGH (ref 0.350–4.500)

## 2017-02-03 LAB — LITHIUM LEVEL: Lithium Lvl: 0.57 mmol/L — ABNORMAL LOW (ref 0.60–1.20)

## 2017-02-03 MED ORDER — CYCLOBENZAPRINE HCL 10 MG PO TABS
10.0000 mg | ORAL_TABLET | Freq: Every evening | ORAL | Status: DC | PRN
  Administered 2017-02-04: 10 mg via ORAL

## 2017-02-03 MED ORDER — LITHIUM CARBONATE 300 MG PO CAPS
600.0000 mg | ORAL_CAPSULE | Freq: Every day | ORAL | Status: DC
  Administered 2017-02-03 – 2017-02-15 (×13): 600 mg via ORAL
  Filled 2017-02-03 (×17): qty 2

## 2017-02-03 MED ORDER — LITHIUM CARBONATE 300 MG PO CAPS
300.0000 mg | ORAL_CAPSULE | Freq: Every day | ORAL | Status: DC
  Administered 2017-02-04 – 2017-02-16 (×13): 300 mg via ORAL
  Filled 2017-02-03 (×16): qty 1

## 2017-02-03 NOTE — H&P (Signed)
Psychiatric Admission Assessment Adult  Patient Identification: Natasha [name] MRN:  [id number] Date of Evaluation:  02/03/2017 Chief Complaint:   " My depression is getting worse " Principal Diagnosis:  MDD, recurrent, severe, no psychotic features  Diagnosis:   Patient Active Problem List   Diagnosis Date Noted  . MDD (major depressive disorder), recurrent severe, without psychosis ([place]) [F33.2] 02/02/2017  . MDD (major depressive disorder) [F32.9] 10/02/2015  . Severe recurrent major depression without psychotic features ([place]) [F33.2] 06/19/2015   History of Present Illness: Patient is 46 year old single female, lives with her mother. She has history of Major Depression. She reports she " had been doing relatively well for a while", and had felt more stable for several months. She says her mood has been worse recently, which she attributes in part to psychosocial stressors, as follows . She lives with her mother, who has been diagnosed with Parkinson's Disease, which has gradually been worsening . 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 States that it has been increasingly difficult for patient and mother to help each other out in daily activities, they have discussed possible transition into assisted living, but patient feels unready to make this step at this time. She reports recent suicidal ideations, with thoughts of self cutting, which she has not acted on. Patient reports worsening neurovegetative symptoms as below.  Associated Signs/Symptoms: Depression Symptoms:  depressed mood, anhedonia, suicidal thoughts with specific plan, anxiety, loss of energy/fatigue, (Hypo) Manic Symptoms:   No  Anxiety Symptoms: anxious ruminations, mainly about above stressors, some panic attacks Psychotic Symptoms:  Denies  PTSD Symptoms: Does not endorse  Total Time spent with patient: 45 minutes  Past Psychiatric History: Has been  diagnosed with Major Depression. History of multiple prior psychiatric admissions for depression. Most recent admission was 9/17, for suicidal ideations. History of severe suicide attempt by jumping off a building years ago, which resulted in spinal cord damage and quadriparesia. Does not endorse history of mania or of psychosis.  Of note, has had ECT treatments in the past .    Is the patient at risk to self? Yes.    Has the patient been a risk to self in the past 6 months? Yes.    Has the patient been a risk to self within the distant past? Yes.    Is the patient a risk to others? No.  Has the patient been a risk to others in the past 6 months? No.  Has the patient been a risk to others within the distant past? No.   Prior Inpatient Therapy: Prior Inpatient Therapy: Yes Prior Therapy Dates: over several years Prior Therapy Facilty/Provider(s): [facility] Reason for Treatment: SI, MDD Prior Outpatient Therapy: Prior Outpatient Therapy: Yes Prior Therapy Dates: currently Prior Therapy Facilty/Provider(s): [facility] Reason for Treatment: med management, therapy Does patient have an ACCT team?: No Does patient have Intensive In-House Services?  : No Does patient have [facility] services? : No Does patient have P4CC services?: Unknown  Alcohol Screening: 1. How often do you have a drink containing alcohol?: Never 9. Have you or someone else been injured as a result of your drinking?: No 10. Has a relative or friend or a doctor or another health worker been concerned about your drinking or suggested you cut down?: No Alcohol Use Disorder Identification Test Final Score (AUDIT): 0 Brief Intervention: AUDIT score less than 7 or less-screening  does not suggest unhealthy drinking-brief intervention not indicated Substance Abuse History in the last 12 months:  Denies alcohol or drug abuse  Consequences of Substance Abuse:  denies  Previous Psychotropic  Medications: most recently on Prozac 60 mgrs QDAY, Klonopin 2 mgrs QHS , Lithium Carbonate 300 mgrs QAM and 600 mgrs QHS, Zyprexa 10 mgrs QHS .  Psychological Evaluations:  No  Past Medical History:  Past Medical History:  Diagnosis Date  . C6 spinal cord injury ([place])   . Depression     Past Surgical History:  Procedure Laterality Date  . BACK SURGERY     Family History: Father deceased, has one brother who lives out of state, lives with mother Family History  Problem Relation Age of Onset  . Alcohol abuse Father   . Alcohol abuse Brother   . Depression Mother   . Alcohol abuse Mother    Family Psychiatric  History: history of depression and of alcohol abuse in family  Tobacco Screening: Have you used any form of tobacco in the last 30 days? (Cigarettes, Smokeless Tobacco, Cigars, and/or Pipes): No Social History:  History  Alcohol Use No     History  Drug Use No    Additional Social History: Marital status: Single    Pain Medications: pt denies abuse - see pta meds list Prescriptions: pt denies abuse - see pta meds list Over the Counter: pt denies abuse - see pta meds list History of alcohol / drug use?: No history of alcohol / drug abuse Longest period of sobriety (when/how long): n/a  Allergies:  No Known Allergies Lab Results:  Results for orders placed or performed during the hospital encounter of 02/02/17 (from the past 48 hour(s))  CBC     Status: None   Collection Time: 02/03/17  6:32 AM  Result Value Ref Range   WBC 8.1 4.0 - 10.5 K/uL   RBC 4.75 3.87 - 5.11 MIL/uL   Hemoglobin 13.3 12.0 - 15.0 g/dL   HCT 41.1 36.0 - 46.0 %   MCV 86.5 78.0 - 100.0 fL   MCH 28.0 26.0 - 34.0 pg   MCHC 32.4 30.0 - 36.0 g/dL   RDW 14.3 11.5 - 15.5 %   Platelets 245 150 - 400 K/uL    Comment: Performed at [facility], [place] [address] [place], [place] [address]  Lipid panel     Status: Abnormal   Collection Time: 02/03/17  6:32 AM  Result Value Ref Range    Cholesterol 214 (H) 0 - 200 mg/dL   Triglycerides 117 <150 mg/dL   HDL 42 >40 mg/dL   Total CHOL/HDL Ratio 5.1 RATIO   VLDL 23 0 - 40 mg/dL   LDL Cholesterol 149 (H) 0 - 99 mg/dL    Comment:        Total Cholesterol/HDL:CHD Risk Coronary Heart Disease Risk Table                     Men   Women  1/2 Average Risk   3.4   3.3  Average Risk       5.0   4.4  2 X Average Risk   9.6   7.1  3 X Average Risk  23.4   11.0        Use the calculated Patient Ratio above and the CHD Risk Table to determine the patient's CHD Risk.        ATP III CLASSIFICATION (LDL):  <100  mg/dL   Optimal  100-129  mg/dL   Near or Above                    Optimal  130-159  mg/dL   Borderline  160-189  mg/dL   High  >190     mg/dL   Very High Performed at [facility] [address] [place], [place] [address]   TSH     Status: Abnormal   Collection Time: 02/03/17  6:32 AM  Result Value Ref Range   TSH 5.474 (H) 0.350 - 4.500 uIU/mL    Comment: Performed by a 3rd Generation assay with a functional sensitivity of <=0.01 uIU/mL. Performed at [facility], [place] [address] [place], [place] [address]   Lithium level     Status: Abnormal   Collection Time: 02/03/17  6:32 AM  Result Value Ref Range   Lithium Lvl 0.57 (L) 0.60 - 1.20 mmol/L    Comment: Performed at [facility], [place] [address], [place],  [address]  Comprehensive metabolic panel     Status: None   Collection Time: 02/03/17  6:32 AM  Result Value Ref Range   Sodium 139 135 - 145 mmol/L   Potassium 3.7 3.5 - 5.1 mmol/L   Chloride 108 101 - 111 mmol/L   CO2 24 22 - 32 mmol/L   Glucose, Bld 85 65 - 99 mg/dL   BUN 14 6 - 20 mg/dL   Creatinine, Ser 0.67 0.44 - 1.00 mg/dL   Calcium 9.6 8.9 - 10.3 mg/dL   Total Protein 6.9 6.5 - 8.1 g/dL   Albumin 3.9 3.5 - 5.0 g/dL   AST 16 15 - 41 U/L   ALT 14 14 - 54 U/L   Alkaline Phosphatase 67 38 - 126 U/L   Total Bilirubin 0.4 0.3 - 1.2 mg/dL    GFR calc non Af Amer >60 >60 mL/min   GFR calc Af Amer >60 >60 mL/min    Comment: (NOTE) The eGFR has been calculated using the CKD EPI equation. This calculation has not been validated in all clinical situations. eGFR's persistently <60 mL/min signify possible Chronic Kidney Disease.    Anion gap 7 5 - 15    Comment: Performed at [facility], [place] [address], [place],  [address]    Blood Alcohol level:  Lab Results  Component Value Date   [facility] <5 08/12/2016   ETH <5 68/07/8109    Metabolic Disorder Labs:  Lab Results  Component Value Date   HGBA1C 5.4 10/31/2015   MPG 108 10/31/2015   No results found for: PROLACTIN Lab Results  Component Value Date   CHOL 214 (H) 02/03/2017   TRIG 117 02/03/2017   HDL 42 02/03/2017   CHOLHDL 5.1 02/03/2017   VLDL 23 02/03/2017   LDLCALC 149 (H) 02/03/2017    Current Medications: Current Facility-Administered Medications  Medication Dose Route Frequency Provider Last Rate Last Dose  . acetaminophen (TYLENOL) tablet 650 mg  650 mg Oral Q6H PRN Ethelene Hal, NP      . alum & mag hydroxide-simeth (MAALOX/MYLANTA) 200-200-20 MG/5ML suspension 30 mL  30 mL Oral Q4H PRN Ethelene Hal, NP      . alum & mag hydroxide-simeth (MAALOX/MYLANTA) 200-200-20 MG/5ML suspension 30 mL  30 mL Oral Q4H PRN Ethelene Hal, NP      . clonazePAM Bobbye [place]) tablet 2 mg  2 mg Oral QHS Ethelene Hal, NP  2 mg at 02/02/17 2221  . cyclobenzaprine (FLEXERIL) tablet 10 mg  10 mg Oral QHS Ethelene Hal, NP   10 mg at 02/02/17 2221  . FLUoxetine (PROZAC) capsule 20 mg  20 mg Oral Daily Ethelene Hal, NP   20 mg at 02/03/17 2595  . hydrOXYzine (ATARAX/VISTARIL) tablet 25 mg  25 mg Oral Q6H PRN Ethelene Hal, NP      . lithium carbonate capsule 300 mg  300 mg Oral BID Ethelene Hal, NP   300 mg at 02/03/17 6387  . magnesium hydroxide (MILK OF MAGNESIA) suspension 30 mL  30 mL Oral Daily  PRN Ethelene Hal, NP      . OLANZapine zydis (ZYPREXA) disintegrating tablet 10 mg  10 mg Oral QHS Ethelene Hal, NP   10 mg at 02/02/17 2221  . traZODone (DESYREL) tablet 50 mg  50 mg Oral QHS PRN Ethelene Hal, NP       PTA Medications: Prescriptions Prior to Admission  Medication Sig Dispense Refill Last Dose  . Cholecalciferol (VITAMIN D-3 PO) Take 1 tablet by mouth every morning.   Past Week  . clonazePAM (KLONOPIN) 2 MG tablet Take 1 tablet (2 mg total) by mouth at bedtime. 15 tablet 0 02/01/2017  . FLUoxetine (PROZAC) 20 MG capsule Take 3 capsules (60 mg total) by mouth daily. 90 capsule 2 02/02/2017  . ibuprofen (ADVIL,MOTRIN) 200 MG tablet Take 800 mg by mouth every 6 (six) hours as needed for moderate pain.    02/01/2017  . lithium carbonate 300 MG capsule Take 1 in am and 2 at bed time (Patient taking differently: Take 300-600 mg by mouth 2 (two) times daily. Take one capsule in the morning and two at bedtime.) 90 capsule 2 02/02/2017  . OLANZapine (ZYPREXA) 10 MG tablet TAKE 1 TABLET(10 MG) BY MOUTH AT BEDTIME (Patient taking differently: Take 10 mg by mouth at bedtime. ) 30 tablet 0 02/01/2017  . oxymetazoline (AFRIN) 0.05 % nasal spray Place 1 spray into both nostrils 2 (two) times daily as needed for congestion.   unknown  . SUMAtriptan (IMITREX) 25 MG tablet Take 1 tablet (25 mg total) by mouth every 2 (two) hours as needed for migraine or headache. May repeat in 2 hours if headache persists or recurs. 10 tablet 0 unknown    Musculoskeletal: Strength & Muscle Tone: decreased Gait & Station: walks slowly with cane  Patient leans: N/A  Psychiatric Specialty Exam: Physical Exam  Review of Systems  Constitutional: Negative.   HENT: Negative.   Eyes: Negative.   Respiratory: Negative.   Cardiovascular: Negative.   Gastrointestinal: Negative.   Genitourinary: Negative.   Musculoskeletal: Negative.   Skin: Negative.   Neurological: Negative for seizures.        Quadriparesia   Endo/Heme/Allergies: Negative.   Psychiatric/Behavioral: Positive for depression and suicidal ideas. The patient is nervous/anxious.   All other systems reviewed and are negative.   Blood pressure 103/69, pulse 67, temperature 98.8 F (37.1 C), temperature source Oral, resp. rate 18, height 5' 5"  (1.651 m), weight 62.1 kg (137 lb), last menstrual period 01/22/2017, SpO2 100 %.Body mass index is 22.8 kg/m.  General Appearance: Fairly Groomed  Eye Contact:  Good  Speech:  Normal Rate  Volume:  Decreased  Mood:  Depressed  Affect:  constricted and vaguely anxious   Thought Process:  Linear  Orientation:  Full (Time, Place, and Person)  Thought Content:  Denies hallucinations, no delusions, not internally preoccupied  Suicidal Thoughts:  denies active suicidal ideations at this time, denies self injurious ideations, contracts for safety on unit at this time  Homicidal Thoughts:  No  Memory:  recent and remote grossly intact   Judgement:  Fair  Insight:  Fair  Psychomotor Activity:  Normal  Concentration:  Concentration: Good and Attention Span: Good  Recall:  Good  Fund of Knowledge:  Good  Language:  Good  Akathisia:  Negative  Handed:  Right  AIMS (if indicated):     Assets:  Communication Skills Desire for Improvement Resilience  ADL's:  Intact  Cognition:  WNL  Sleep:       Treatment Plan Summary: Daily contact with patient to assess and evaluate symptoms and progress in treatment, Medication management, Plan inpatient treatment  and medications as below  Observation Level/Precautions:  15 minute checks  Laboratory:  as needed   Psychotherapy:  Milieu, support , group therapy   Medications:  With patient's consent have contacted Dr. Adele Schilder, her outpatient psychiatrist, to discuss potential medication adjustments or changes For now continue Klonopin 2 mgrs QHS, Lithium Carbonate at  300 mgrs QAM and 600 mgrs QHS, Zyprexa 10 mgrs QHS. Will decrease  Prozac to 20 mgrs QDAY as higher doses may be contributing to increased sense of anxiety.   Consultations:  As needed  Discharge Concerns:  - see HPI  Estimated LOS: 5-6 days   Other:     Physician Treatment Plan for Primary Diagnosis: Severe recurrent major depression without psychotic features ([place]) Long Term Goal(s): Improvement in symptoms so as ready for discharge  Short Term Goals: Ability to verbalize feelings will improve, Ability to disclose and discuss suicidal ideas, Ability to demonstrate self-control will improve, Ability to identify and develop effective coping behaviors will improve and Ability to maintain clinical measurements within normal limits will improve  Physician Treatment Plan for Secondary Diagnosis: Principal Problem:   Severe recurrent major depression without psychotic features ([place]) Active Problems:   MDD (major depressive disorder), recurrent severe, without psychosis ([place])  Long Term Goal(s): Improvement in symptoms so as ready for discharge  Short Term Goals: Ability to verbalize feelings will improve, Ability to disclose and discuss suicidal ideas, Ability to demonstrate self-control will improve, Ability to identify and develop effective coping behaviors will improve and Ability to maintain clinical measurements within normal limits will improve  I certify that inpatient services furnished can reasonably be expected to improve the patient's condition.    Neita Garnet, MD 2/22/20181:53 PM

## 2017-02-03 NOTE — Tx Team (Signed)
Interdisciplinary Treatment and Diagnostic Plan Update  02/03/2017 Time of Session: 9:30am Natasha [name] MRN: [id number]  Principal Diagnosis: Severe recurrent major depression without psychotic features [facility])  Secondary Diagnoses: Principal Problem:   Severe recurrent major depression without psychotic features ([place]) Active Problems:   MDD (major depressive disorder), recurrent severe, without psychosis ([place])   Current Medications:  Current Facility-Administered Medications  Medication Dose Route Frequency Provider Last Rate Last Dose  . acetaminophen (TYLENOL) tablet 650 mg  650 mg Oral Q6H PRN Ethelene Hal, NP      . alum & mag hydroxide-simeth (MAALOX/MYLANTA) 200-200-20 MG/5ML suspension 30 mL  30 mL Oral Q4H PRN Ethelene Hal, NP      . alum & mag hydroxide-simeth (MAALOX/MYLANTA) 200-200-20 MG/5ML suspension 30 mL  30 mL Oral Q4H PRN Ethelene Hal, NP      . clonazePAM Bobbye [place]) tablet 2 mg  2 mg Oral QHS Ethelene Hal, NP   2 mg at 02/02/17 2221  . cyclobenzaprine (FLEXERIL) tablet 10 mg  10 mg Oral QHS Ethelene Hal, NP   10 mg at 02/02/17 2221  . FLUoxetine (PROZAC) capsule 20 mg  20 mg Oral Daily Ethelene Hal, NP   20 mg at 02/03/17 3710  . hydrOXYzine (ATARAX/VISTARIL) tablet 25 mg  25 mg Oral Q6H PRN Ethelene Hal, NP      . lithium carbonate capsule 300 mg  300 mg Oral BID Ethelene Hal, NP   300 mg at 02/03/17 6269  . magnesium hydroxide (MILK OF MAGNESIA) suspension 30 mL  30 mL Oral Daily PRN Ethelene Hal, NP      . OLANZapine zydis (ZYPREXA) disintegrating tablet 10 mg  10 mg Oral QHS Ethelene Hal, NP   10 mg at 02/02/17 2221  . traZODone (DESYREL) tablet 50 mg  50 mg Oral QHS PRN Ethelene Hal, NP        PTA Medications: Prescriptions Prior to Admission  Medication Sig Dispense Refill Last Dose  . Cholecalciferol (VITAMIN D-3 PO) Take 1 tablet by mouth every morning.   Past Week  .  clonazePAM (KLONOPIN) 2 MG tablet Take 1 tablet (2 mg total) by mouth at bedtime. 15 tablet 0 02/01/2017  . FLUoxetine (PROZAC) 20 MG capsule Take 3 capsules (60 mg total) by mouth daily. 90 capsule 2 02/02/2017  . ibuprofen (ADVIL,MOTRIN) 200 MG tablet Take 800 mg by mouth every 6 (six) hours as needed for moderate pain.    02/01/2017  . lithium carbonate 300 MG capsule Take 1 in am and 2 at bed time (Patient taking differently: Take 300-600 mg by mouth 2 (two) times daily. Take one capsule in the morning and two at bedtime.) 90 capsule 2 02/02/2017  . OLANZapine (ZYPREXA) 10 MG tablet TAKE 1 TABLET(10 MG) BY MOUTH AT BEDTIME (Patient taking differently: Take 10 mg by mouth at bedtime. ) 30 tablet 0 02/01/2017  . oxymetazoline (AFRIN) 0.05 % nasal spray Place 1 spray into both nostrils 2 (two) times daily as needed for congestion.   unknown  . SUMAtriptan (IMITREX) 25 MG tablet Take 1 tablet (25 mg total) by mouth every 2 (two) hours as needed for migraine or headache. May repeat in 2 hours if headache persists or recurs. 10 tablet 0 unknown    Treatment Modalities: Medication Management, Group therapy, Case management,  1 to 1 session with clinician, Psychoeducation, Recreational therapy.  Patient Stressors: Loss of Pt's pet dog passed away recently  Patient  Strengths: Ability for insight Average or above average intelligence Communication skills General fund of knowledge  Physician Treatment Plan for Primary Diagnosis: Severe recurrent major depression without psychotic features ([place]) Long Term Goal(s): Improvement in symptoms so as ready for discharge  Short Term Goals: Ability to verbalize feelings will improve Ability to disclose and discuss suicidal ideas Ability to demonstrate self-control will improve Ability to identify and develop effective coping behaviors will improve Ability to maintain clinical measurements within normal limits will improve Ability to verbalize feelings will  improve Ability to disclose and discuss suicidal ideas Ability to demonstrate self-control will improve Ability to identify and develop effective coping behaviors will improve Ability to maintain clinical measurements within normal limits will improve  Medication Management: Evaluate patient's response, side effects, and tolerance of medication regimen.  Therapeutic Interventions: 1 to 1 sessions, Unit Group sessions and Medication administration.  Evaluation of Outcomes: Not Met  Physician Treatment Plan for Secondary Diagnosis: Principal Problem:   Severe recurrent major depression without psychotic features ([place]) Active Problems:   MDD (major depressive disorder), recurrent severe, without psychosis ([place])   Long Term Goal(s): Improvement in symptoms so as ready for discharge  Short Term Goals: Ability to verbalize feelings will improve Ability to disclose and discuss suicidal ideas Ability to demonstrate self-control will improve Ability to identify and develop effective coping behaviors will improve Ability to maintain clinical measurements within normal limits will improve Ability to verbalize feelings will improve Ability to disclose and discuss suicidal ideas Ability to demonstrate self-control will improve Ability to identify and develop effective coping behaviors will improve Ability to maintain clinical measurements within normal limits will improve  Medication Management: Evaluate patient's response, side effects, and tolerance of medication regimen.  Therapeutic Interventions: 1 to 1 sessions, Unit Group sessions and Medication administration.  Evaluation of Outcomes: Not Met   RN Treatment Plan for Primary Diagnosis: Severe recurrent major depression without psychotic features ([place]) Long Term Goal(s): Knowledge of disease and therapeutic regimen to maintain health will improve  Short Term Goals: Ability to verbalize feelings will improve and Ability to disclose  and discuss suicidal ideas  Medication Management: RN will administer medications as ordered by provider, will assess and evaluate patient's response and provide education to patient for prescribed medication. RN will report any adverse and/or side effects to prescribing provider.  Therapeutic Interventions: 1 on 1 counseling sessions, Psychoeducation, Medication administration, Evaluate responses to treatment, Monitor vital signs and CBGs as ordered, Perform/monitor CIWA, COWS, AIMS and Fall Risk screenings as ordered, Perform wound care treatments as ordered.  Evaluation of Outcomes: Not Met   LCSW Treatment Plan for Primary Diagnosis: Severe recurrent major depression without psychotic features ([place]) Long Term Goal(s): Safe transition to appropriate next level of care at discharge, Engage patient in therapeutic group addressing interpersonal concerns.  Short Term Goals: Engage patient in aftercare planning with referrals and resources, Identify triggers associated with mental health/substance abuse issues and Increase skills for wellness and recovery  Therapeutic Interventions: Assess for all discharge needs, 1 to 1 time with Social worker, Explore available resources and support systems, Assess for adequacy in community support network, Educate family and significant other(s) on suicide prevention, Complete Psychosocial Assessment, Interpersonal group therapy.  Evaluation of Outcomes: Not Met   Progress in Treatment: Attending groups: Pt is new to milieu, continuing to assess  Participating in groups: Pt is new to milieu, continuing to assess  Taking medication as prescribed: Yes, MD continues to assess for medication changes as needed Toleration  medication: Yes, no side effects reported at this time Family/Significant other contact made: No, CSW attempting to make contact with mother Patient understands diagnosis: Continuing to assess Discussing patient identified problems/goals with  staff: Yes Medical problems stabilized or resolved: Yes Denies suicidal/homicidal ideation: No, Pt endorses passive SI Issues/concerns per patient self-inventory: None Other: N/A  New problem(s) identified: None identified at this time.   New Short Term/Long Term Goal(s): None identified at this time.   Discharge Plan or Barriers: Pt will return home and follow-up with outpatient services at [facility].  Reason for Continuation of Hospitalization: Anxiety Depression Medication stabilization Suicidal ideation  Estimated Length of Stay: 3-5 days  Attendees: Patient: 02/03/2017  1:22 PM  Physician: Dr. Parke Poisson 02/03/2017  1:22 PM  Nursing: Gaylan Gerold, RN; Mayra Neer, RN 02/03/2017  1:22 PM  RN Care Manager: Lars Pinks, RN 02/03/2017  1:22 PM  Social Worker: Adriana Reams, LCSW 02/03/2017  1:22 PM  Recreational Therapist:  02/03/2017  1:22 PM  Other: Lindell Spar, NP; Samuel Jester, NP 02/03/2017  1:22 PM  Other:  02/03/2017  1:22 PM  Other: 02/03/2017  1:22 PM    Scribe for Treatment Team: Gladstone Lighter, LCSW 02/03/2017 1:22 PM

## 2017-02-03 NOTE — Progress Notes (Signed)
Nursing Progress Note: 7p-7a D: Pt currently presents with a sad/depressed affect and behavior. Pt states "I don't want to talk. I'm just feeling really helpless, hopeless, etc." Interacting minimally with milieu. Pt reports ok sleep with current medication regimen.   A: Pt provided with medications per providers orders. Pt's labs and vitals were monitored throughout the night. Pt supported emotionally and encouraged to express concerns and questions. Pt educated on medications.  R: Pt's safety ensured with 15 minute and environmental checks. Pt currently denies HI and AVH and endorses passive SI. Pt verbally contracts to seek staff if SI/HI or A/VH occurs and to consult with staff before acting on any harmful thoughts. Will continue to monitor.

## 2017-02-03 NOTE — [facility] Group Notes (Signed)
[facility] Group Notes:  (Nursing/MHT/Case Management/Adjunct)  Date:  02/03/2017  Time:  0900 am  Type of Therapy:  Psychoeducational Skills  Participation Level:  Did Not Attend  Patient invited; declined to attend.  Cranford MonBeaudry, Mika Griffitts Evans 02/03/2017, 2:23 PM

## 2017-02-03 NOTE — Plan of Care (Signed)
Problem: Activity: Goal: Interest or engagement in activities will improve Outcome: Not Progressing Patient remains isolative and withdrawn.  She is not attending groups.
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
## 2017-02-03 NOTE — [facility] Group Notes (Signed)
[facility] 02/03/2017 1:15pm  Type of Therapy: Mental Health Association Presentation  Pt did not attend, declined invitation.     Vernie ShanksLauren Daiana Vitiello, LCSW 02/03/2017 1:25 PM

## 2017-02-03 NOTE — Progress Notes (Signed)
D: Patient is lying in bed.  Requested that she give a urine sample and patient states, "I can't right now."  Informed her to drink some fluids and return it to staff soon.  She reports passive self harm thoughts.  She can contract for safety on the unit.  She is isolative to her room.  Patient rates her depression as a 7; hopelessness as a 9; anxiety as an 8.  Patient goal today is to "stay out of room." Patient presents with flat, blunted affect; her mood is depressed.   A: Continue to monitor medication management and MD orders.  Safety checks completed every 15 minutes per protocol.  Offer support and encouragement as needed. R: Patient remains isolated and withdrawn.

## 2017-02-04 LAB — HEMOGLOBIN A1C
Hgb A1c MFr Bld: 4.7 % — ABNORMAL LOW (ref 4.8–5.6)
Mean Plasma Glucose: 88

## 2017-02-04 LAB — URINALYSIS, ROUTINE W REFLEX MICROSCOPIC
Bilirubin Urine: NEGATIVE
Glucose, UA: NEGATIVE mg/dL
Hgb urine dipstick: NEGATIVE
Ketones, ur: 5 mg/dL — AB
Leukocytes, UA: NEGATIVE
Nitrite: NEGATIVE
Protein, ur: 30 mg/dL — AB
Specific Gravity, Urine: 1.027 (ref 1.005–1.030)
pH: 5 (ref 5.0–8.0)

## 2017-02-04 LAB — PROLACTIN: Prolactin: 121.9 ng/mL — ABNORMAL HIGH (ref 4.8–23.3)

## 2017-02-04 MED ORDER — OLANZAPINE 5 MG PO TBDP
5.0000 mg | ORAL_TABLET | Freq: Every day | ORAL | Status: DC
  Administered 2017-02-04 – 2017-02-07 (×4): 5 mg via ORAL
  Filled 2017-02-04 (×7): qty 1

## 2017-02-04 MED ORDER — ESCITALOPRAM OXALATE 5 MG PO TABS
5.0000 mg | ORAL_TABLET | Freq: Every day | ORAL | Status: DC
  Administered 2017-02-05 – 2017-02-08 (×4): 5 mg via ORAL
  Filled 2017-02-04 (×7): qty 1

## 2017-02-04 NOTE — Plan of Care (Signed)
Problem: Safety: Goal: Periods of time without injury will increase Outcome: Progressing Patient denies active SI and contracts for safety. Patient is on q15 minute safety checks.   

## 2017-02-04 NOTE — Progress Notes (Signed)
[name] is observed pushing herself up the hall.as she sits in her wheelchair. She uses her arms to propel the wheelchari forward and shuffles her feet a little to slowly move forward. A She is flat, blunted and makes little eye contact. She does complete her daily assessment and on it she wrote she has experienced SI today and she rates her depressioon ,, anxiety and hopelessness " 7/10/7". When writer asks her how she feels , in relationship to when she was first admitted here, she repleis " the same". She does take her medicaitons as planned and she does attend ehr groups. R Safety is in place. RN to cont to reinforce distress tolerance, to offer pos feedback about pt attending therapies and discussed developing additional healthy coping skills to help pt.

## 2017-02-04 NOTE — Progress Notes (Signed)
Recreation Therapy Notes  Date: 02/04/17 Time: 0930 Location: [address] Dayroom  Group Topic: Stress Management  Goal Area(s) Addresses:  Patient will verbalize importance of using healthy stress management.  Patient will identify positive emotions associated with healthy stress management.   Intervention: Stress Management  Activity :  Focus Meditation.  LRT introduced the stress management technique of meditation.  LRT played a meditation on focus from the Calm app so patients could engage in the activity.  Patients were to follow along as the meditation was played to participate.  Education:  Stress Management, Discharge Planning.   Education Outcome: Acknowledges edcuation/In group clarification offered/Needs additional education  Clinical Observations/Feedback: Pt did not attend group.   Caroll RancherMarjette Nikaya Nasby, LRT/CTRS         Caroll RancherLindsay, Okley Magnussen A 02/04/2017 12:41 PM

## 2017-02-04 NOTE — [facility] Group Notes (Signed)
In wrap-up group patients were asked to describe their day in one word and then elaborate.  Pts word to describe her day was "depressing".  Pt states that her day was a bit depressing because she has not been feeling like herself and has been isolative.  Pt states that her goal is to become more engaged with peers and to attend the group sessions.  Tomi BambergerMariya Amor Hyle, MHT

## 2017-02-04 NOTE — Progress Notes (Signed)
Adult Psychoeducational Group Note  Date:  02/04/2017 Time:  10:09 AM  Group Topic/Focus:  Relapse Prevention Planning:   The focus of this group is to define relapse and discuss the need for planning to combat relapse.  Participation Level:  Active  Participation Quality:  Appropriate  Affect:  Appropriate  Cognitive:  Appropriate  Insight: Appropriate and Good  Engagement in Group:  Engaged  Modes of Intervention:  Activity and Discussion  Additional Comments:  Pt attended group this morning and participated. Pt and peers discussed what causes them to relapse and healthy ways to deal with stress, depression, and anxiety. Pt and peers were able to identify their triggers and coping skills. Pt was pleasant and appropriate in group.    Marik Sedore A 02/04/2017, 10:09 AM

## 2017-02-04 NOTE — [facility] Group Notes (Signed)
[facility] LCSW Group Therapy 02/04/2017 1:15pm  Type of Therapy: Group Therapy- Feelings Around Relapse and Recovery  Pt did not attend, declined invitation.   Vernie ShanksLauren Anne Boltz, LCSW [phone] 02/04/2017 4:06 PM

## 2017-02-04 NOTE — Progress Notes (Signed)
[facility] MD Progress Note  02/04/2017 1:58 PM Natasha [name]  MRN:  [id number] Subjective:  Patient reports ongoing depression, low energy level , anhedonia. Denies active suicidal ideations and contracts for safety on unit. Denies medication side effects, but feels current medication regimen is not effectively addressing her symptoms. Objective : I have discussed case with treatment team and have met with patient. Patient continues to present sad, depressed, although affect is somewhat reactive and smiles briefly at times. Reports passive SI, thoughts of dying, but denies plan or intention of hurting self or of suicide, and identifies her sense of responsibility towards her mother as a protective factor from hurting self.  Denies medication side effects. She had been taking Prozac up to 60 mgrs prior to her admission , may have been experiencing some activation and increased anxiety at this higher dose, and states her depression worsened in spite of dose and good compliance. With her consent , I discussed case with Dr. Adele Schilder, her outpatient psychiatrist, who reported he has not seen patient in a few months, agrees with medication adjustments, and will continue to follow patient after discharge. More visible on unit, going to some groups. Labs reviewed as below.   Principal Problem: MDD (major depressive disorder), recurrent severe, without psychosis ([place]) Diagnosis:   Patient Active Problem List   Diagnosis Date Noted  . MDD (major depressive disorder), recurrent severe, without psychosis ([place]) [F33.2] 02/02/2017  . MDD (major depressive disorder) [F32.9] 10/02/2015  . Severe recurrent major depression without psychotic features ([place]) [F33.2] 06/19/2015   Total Time spent with patient: 20 minutes  Past Medical History:  Past Medical History:  Diagnosis Date  . C6 spinal cord injury ([place])   . Depression     Past Surgical History:  Procedure Laterality Date  . BACK SURGERY     Family  History:  Family History  Problem Relation Age of Onset  . Alcohol abuse Father   . Alcohol abuse Brother   . Depression Mother   . Alcohol abuse Mother    Social History:  History  Alcohol Use No     History  Drug Use No    Social History   Social History  . Marital status: Single    Spouse name: N/A  . Number of children: N/A  . Years of education: N/A   Social History Main Topics  . Smoking status: Never Smoker  . Smokeless tobacco: Never Used     Comment: No smoking hx; no need for cessation materials  . Alcohol use No  . Drug use: No  . Sexual activity: No   Other Topics Concern  . None   Social History Narrative  . None   Additional Social History:    Pain Medications: pt denies abuse - see pta meds list Prescriptions: pt denies abuse - see pta meds list Over the Counter: pt denies abuse - see pta meds list History of alcohol / drug use?: No history of alcohol / drug abuse Longest period of sobriety (when/how long): n/a  Sleep: Good  Appetite:  Good  Current Medications: Current Facility-Administered Medications  Medication Dose Route Frequency Provider Last Rate Last Dose  . acetaminophen (TYLENOL) tablet 650 mg  650 mg Oral Q6H PRN Ethelene Hal, NP      . alum & mag hydroxide-simeth (MAALOX/MYLANTA) 200-200-20 MG/5ML suspension 30 mL  30 mL Oral Q4H PRN Ethelene Hal, NP      . alum & mag hydroxide-simeth (MAALOX/MYLANTA) 200-200-20 MG/5ML suspension  30 mL  30 mL Oral Q4H PRN Ethelene Hal, NP      . clonazePAM [facility]) tablet 2 mg  2 mg Oral QHS Ethelene Hal, NP   2 mg at 02/03/17 2343  . cyclobenzaprine (FLEXERIL) tablet 10 mg  10 mg Oral QHS PRN [name], MD   10 mg at 02/04/17 0849  . FLUoxetine (PROZAC) capsule 20 mg  20 mg Oral Daily Ethelene Hal, NP   20 mg at 02/04/17 0849  . hydrOXYzine (ATARAX/VISTARIL) tablet 25 mg  25 mg Oral Q6H PRN Ethelene Hal, NP      . lithium carbonate capsule  300 mg  300 mg Oral Daily [name], MD   300 mg at 02/04/17 0849  . lithium carbonate capsule 600 mg  600 mg Oral QHS [name], MD   600 mg at 02/03/17 2344  . magnesium hydroxide (MILK OF MAGNESIA) suspension 30 mL  30 mL Oral Daily PRN Ethelene Hal, NP      . OLANZapine zydis (ZYPREXA) disintegrating tablet 10 mg  10 mg Oral QHS Ethelene Hal, NP   10 mg at 02/03/17 2344    Lab Results:  Results for orders placed or performed during the hospital encounter of 02/02/17 (from the past 48 hour(s))  CBC     Status: None   Collection Time: 02/03/17  6:32 AM  Result Value Ref Range   WBC 8.1 4.0 - 10.5 K/uL   RBC 4.75 3.87 - 5.11 MIL/uL   Hemoglobin 13.3 12.0 - 15.0 g/dL   HCT 41.1 36.0 - 46.0 %   MCV 86.5 78.0 - 100.0 fL   MCH 28.0 26.0 - 34.0 pg   MCHC 32.4 30.0 - 36.0 g/dL   RDW 14.3 11.5 - 15.5 %   Platelets 245 150 - 400 K/uL    Comment: Performed at [facility], [place] [address], [place], [place] [address]  Hemoglobin A1c     Status: Abnormal   Collection Time: 02/03/17  6:32 AM  Result Value Ref Range   Hgb A1c MFr Bld 4.7 (L) 4.8 - 5.6 %    Comment: (NOTE)         Pre-diabetes: 5.7 - 6.4         Diabetes: >6.4         Glycemic control for adults with diabetes: <7.0    Mean Plasma Glucose 88     Comment: (NOTE) Performed At: [facility] [place], [place] [id number] Lindon Romp MD [id number] Performed at [facility], [place] [address] [place], [place] [address]   Lipid panel     Status: Abnormal   Collection Time: 02/03/17  6:32 AM  Result Value Ref Range   Cholesterol 214 (H) 0 - 200 mg/dL   Triglycerides 117 <150 mg/dL   HDL 42 >40 mg/dL   Total CHOL/HDL Ratio 5.1 RATIO   VLDL 23 0 - 40 mg/dL   LDL Cholesterol 149 (H) 0 - 99 mg/dL    Comment:        Total Cholesterol/HDL:CHD Risk Coronary Heart Disease Risk Table                     Men   Women  1/2 Average Risk    3.4   3.3  Average Risk       5.0   4.4  2 X Average Risk   9.6  7.1  3 X Average Risk  23.4   11.0        Use the calculated Patient Ratio above and the CHD Risk Table to determine the patient's CHD Risk.        ATP III CLASSIFICATION (LDL):  <100     mg/dL   Optimal  100-129  mg/dL   Near or Above                    Optimal  130-159  mg/dL   Borderline  160-189  mg/dL   High  >190     mg/dL   Very High Performed at [place] [address], [place], [place] [address]   TSH     Status: Abnormal   Collection Time: 02/03/17  6:32 AM  Result Value Ref Range   TSH 5.474 (H) 0.350 - 4.500 uIU/mL    Comment: Performed by a 3rd Generation assay with a functional sensitivity of <=0.01 uIU/mL. Performed at [facility], [place] [address], [place], [place] [address]   Prolactin     Status: Abnormal   Collection Time: 02/03/17  6:32 AM  Result Value Ref Range   Prolactin 121.9 (H) 4.8 - 23.3 ng/mL    Comment: (NOTE) Performed At: [facility] [place], [place] [id number] Lindon Romp MD [id number] Performed at [facility], [place] [address] [place], [place] [address]   Lithium level     Status: Abnormal   Collection Time: 02/03/17  6:32 AM  Result Value Ref Range   Lithium Lvl 0.57 (L) 0.60 - 1.20 mmol/L    Comment: Performed at [facility], [place] [address] [place], [place] [address]  Comprehensive metabolic panel     Status: None   Collection Time: 02/03/17  6:32 AM  Result Value Ref Range   Sodium 139 135 - 145 mmol/L   Potassium 3.7 3.5 - 5.1 mmol/L   Chloride 108 101 - 111 mmol/L   CO2 24 22 - 32 mmol/L   Glucose, Bld 85 65 - 99 mg/dL   BUN 14 6 - 20 mg/dL   Creatinine, Ser 0.67 0.44 - 1.00 mg/dL   Calcium 9.6 8.9 - 10.3 mg/dL   Total Protein 6.9 6.5 - 8.1 g/dL   Albumin 3.9 3.5 - 5.0 g/dL   AST 16 15 - 41 U/L   ALT 14 14 - 54 U/L   Alkaline Phosphatase 67 38 - 126 U/L    Total Bilirubin 0.4 0.3 - 1.2 mg/dL   GFR calc non Af Amer >60 >60 mL/min   GFR calc Af Amer >60 >60 mL/min    Comment: (NOTE) The eGFR has been calculated using the CKD EPI equation. This calculation has not been validated in all clinical situations. eGFR's persistently <60 mL/min signify possible Chronic Kidney Disease.    Anion gap 7 5 - 15    Comment: Performed at [facility], [place] [address], [place], [place] [address]  Urinalysis, Routine w reflex microscopic     Status: Abnormal   Collection Time: 02/04/17  6:30 AM  Result Value Ref Range   Color, Urine AMBER (A) YELLOW    Comment: BIOCHEMICALS MAY BE AFFECTED BY COLOR   APPearance CLOUDY (A) CLEAR   Specific Gravity, Urine 1.027 1.005 - 1.030   pH 5.0 5.0 - 8.0   Glucose, UA NEGATIVE NEGATIVE mg/dL   Hgb urine dipstick NEGATIVE NEGATIVE   Bilirubin  Urine NEGATIVE NEGATIVE   Ketones, ur 5 (A) NEGATIVE mg/dL   Protein, ur 30 (A) NEGATIVE mg/dL   Nitrite NEGATIVE NEGATIVE   Leukocytes, UA NEGATIVE NEGATIVE   RBC / HPF 0-5 0 - 5 RBC/hpf   WBC, UA 6-30 0 - 5 WBC/hpf   Bacteria, UA MANY (A) NONE SEEN   Squamous Epithelial / LPF 6-30 (A) NONE SEEN   Mucous PRESENT    Ca Oxalate Crys, UA PRESENT     Comment: Performed at [facility], [place] [address] [place], [place] [address]    Blood Alcohol level:  Lab Results  Component Value Date   [facility] <5 08/12/2016   ETH <5 38/17/7116    Metabolic Disorder Labs: Lab Results  Component Value Date   HGBA1C 4.7 (L) 02/03/2017   MPG 88 02/03/2017   MPG 108 10/31/2015   Lab Results  Component Value Date   PROLACTIN 121.9 (H) 02/03/2017   Lab Results  Component Value Date   CHOL 214 (H) 02/03/2017   TRIG 117 02/03/2017   HDL 42 02/03/2017   CHOLHDL 5.1 02/03/2017   VLDL 23 02/03/2017   LDLCALC 149 (H) 02/03/2017    Physical Findings: AIMS: Facial and Oral Movements Muscles of Facial Expression: None, normal Lips and Perioral Area:  None, normal Jaw: None, normal Tongue: None, normal,Extremity Movements Upper (arms, wrists, hands, fingers): None, normal Lower (legs, knees, ankles, toes): None, normal, Trunk Movements Neck, shoulders, hips: None, normal, Overall Severity Severity of abnormal movements (highest score from questions above): None, normal Incapacitation due to abnormal movements: None, normal Patient's awareness of abnormal movements (rate only patient's report): No Awareness, Dental Status Current problems with teeth and/or dentures?: No Does patient usually wear dentures?: No  CIWA:    COWS:     Musculoskeletal: Strength & Muscle Tone: history of quadriparesia Gait & Station: mobilizes slowly with cane or in wheel chair Patient leans: N/A  Psychiatric Specialty Exam: Physical Exam  ROS no chest pain, no shortness of breath, no vomiting, no dysuria reported, no urgency, no fever, no chills   Blood pressure 103/69, pulse 67, temperature 98.8 F (37.1 C), temperature source Oral, resp. rate 18, height 5' 5"  (1.651 m), weight 62.1 kg (137 lb), last menstrual period 01/22/2017, SpO2 100 %.Body mass index is 22.8 kg/m.  General Appearance: Fairly Groomed  Eye Contact:  Good  Speech:  Normal Rate  Volume:  Decreased  Mood:  Depressed  Affect:  constricted, but smiles briefly at times   Thought Process:  Linear and Descriptions of Associations: Intact  Orientation:  Full (Time, Place, and Person)  Thought Content:  no hallucinations, no delusions, not internally preoccpied   Suicidal Thoughts:  Yes.  without intent/plan denies active suicidal or self injurious ideations, contracts for safety on unit, denies any homicidal or violent ideations   Homicidal Thoughts:  No  Memory:  recent and remote grossly intact   Judgement:  Fair  Insight:  Present  Psychomotor Activity:  Decreased, but more visible in day room, milieu today  Concentration:  Concentration: Good and Attention Span: Good  Recall:  Good   Fund of Knowledge:  Good  Language:  Good  Akathisia:  Negative  Handed:  Right  AIMS (if indicated):     Assets:  Communication Skills Desire for Improvement Resilience  ADL's:  Intact  Cognition:  WNL  Sleep:  Number of Hours: 4.5   Assessment - patient has history of chronic depression, intermittent suicidal ideations. She is  facing significant stressors, mainly related to limited support system other than her mother, who has become progressively more affected by Parknson's Disease . Patient is tolerating medications well, but reports some restlessness and worsening of depression in spite of high dose of Prozac. She is on Zyprexa- not as mood stabilizer ( denies history of bipolar disorder, or history of psychosis) . States it has been prescribed mostly for sleep and improvement of appetite, which it has helped with . She has been on Lithium for years, feels it has worked , no suicidal attempts since on Lithium.  She is interested in adjusting, changing medication regimen in hopes of improved clinical response. Of note, reports she has a history of having responded to Lexapro in the past. Patient has also responded to ECT in the past, and has been on maintenance ECT regimen with some maintenance of improvement, but states that at this time she does not want to consider restarting an ECT course .   Treatment Plan Summary: Daily contact with patient to assess and evaluate symptoms and progress in treatment, Medication management, Plan inpatient treatment and medications as below Encourage group and milieu participation to work on coping skills and symptom reduction D/C Prozac - see above  Start  Lexapro 5 mgrs QDAY for depression , anxiety - see above  Decrease Zyprexa to 5 mgrs QHS - will consider tapering off gradually, possibly replacing with Remeron for depression and insomnia  Continue Lithium 300 mgrs QAM and 600 mgrs QHS for mood disorder, antidepressant augmentation,suicidal  ideations. Continue Klonopin 2 mgrs QHS for anxiety, insomnia Treatment team working on disposition planning options  Neita Garnet, MD 02/04/2017, 1:58 PM

## 2017-02-04 NOTE — Progress Notes (Signed)
Nursing Progress Note 7p-7a  D) Patient presents anxious, flat and depressed. Patient reports having a "good visit with my mom". Patient states she is anxious because "my mom has Parkinson's and she has a flat tire. I'm worried about her getting home". [facility] and is helping her mother. Patient reports to Clinical research associatewriter later that her mom "got home safely". Patient states "I need to be able to take care of her". Patient denies SI/HI/AVH or pain. Patient contracts for safety at this time. Patient takes medications without complaints. Patient is observed in the dayroom watching TV during shift.  A) Emotional support given. 1:1 interaction and active listening provided. Patient medicated with PM orders as prescribed. Medications reviewed with patient. Patient verbalized understanding of medications without further questions.  Snacks and fluids provided. Opportunities for questions or concerns presented to patient. Patient encouraged to continue to work on treatment goals. Labs, vital signs and patient behavior monitored throughout shift. Patient safety maintained with q15 min safety checks.  R) Patient receptive to interaction with nurse. Patient remains safe on the unit at this time. Patient denies any adverse medication reactions at this time. Patient is resting in bed without complaints. Will continue to monitor.

## 2017-02-05 ENCOUNTER — Encounter (HOSPITAL_COMMUNITY): Payer: Self-pay | Admitting: Registered Nurse

## 2017-02-05 DIAGNOSIS — R45851 Suicidal ideations: Secondary | ICD-10-CM

## 2017-02-05 DIAGNOSIS — Z79899 Other long term (current) drug therapy: Secondary | ICD-10-CM

## 2017-02-05 DIAGNOSIS — F332 Major depressive disorder, recurrent severe without psychotic features: Principal | ICD-10-CM

## 2017-02-05 DIAGNOSIS — Z811 Family history of alcohol abuse and dependence: Secondary | ICD-10-CM

## 2017-02-05 DIAGNOSIS — Z818 Family history of other mental and behavioral disorders: Secondary | ICD-10-CM

## 2017-02-05 NOTE — Progress Notes (Signed)
[facility] MD Progress Note  02/05/2017 11:30 AM Natasha [name]  MRN:  [id number]     Subjective:  Patient  Reports that she is feeling ok.  States that she continues to have suicidal thoughts but is able to contract for safety.  Reports that she is tolerating her medication without adverse reaction; sleeping ok.  States that she is eating better but not back to normal. Rates depression 7/10, anxiety 7/10 on scale 0/none and 1/worse.  Denies homicidal ideation, psychosis, and paranoia  Objective : Reviewed chart and discussed case with treatment team.   Patient continues to present depressed and report suicidal thoughts but is able to contract for safety.  Denies plan of hurting her self.  Started Lexapro this morning.  Tolerating the changes in medications at this time without adverse reaction.      Principal Problem: MDD (major depressive disorder), recurrent severe, without psychosis (HCC) Diagnosis:   Patient Active Problem List   Diagnosis Date Noted  . MDD (major depressive disorder), recurrent severe, without psychosis (HCC) [F33.2] 02/02/2017  . MDD (major depressive disorder) [F32.9] 10/02/2015  . Severe recurrent major depression without psychotic features (HCC) [F33.2] 06/19/2015   Total Time spent with patient: 20 minutes  Past Medical History:  Past Medical History:  Diagnosis Date  . C6 spinal cord injury (HCC)   . Depression     Past Surgical History:  Procedure Laterality Date  . BACK SURGERY     Family History:  Family History  Problem Relation Age of Onset  . Alcohol abuse Father   . Alcohol abuse Brother   . Depression Mother   . Alcohol abuse Mother    Social History:  History  Alcohol Use No     History  Drug Use No    Social History   Social History  . Marital status: Single    Spouse name: N/A  . Number of children: N/A  . Years of education: N/A   Social History Main Topics  . Smoking status: Never Smoker  . Smokeless tobacco: Never Used   Comment: No smoking hx; no need for cessation materials  . Alcohol use No  . Drug use: No  . Sexual activity: No   Other Topics Concern  . None   Social History Narrative  . None   Additional Social History:    Pain Medications: pt denies abuse - see pta meds list Prescriptions: pt denies abuse - see pta meds list Over the Counter: pt denies abuse - see pta meds list History of alcohol / drug use?: No history of alcohol / drug abuse Longest period of sobriety (when/how long): n/a  Sleep: Good  Appetite:  Fair  Current Medications: Current Facility-Administered Medications  Medication Dose Route Frequency Provider Last Rate Last Dose  . acetaminophen (TYLENOL) tablet 650 mg  650 mg Oral Q6H PRN Laveda Abbe, NP      . alum & mag hydroxide-simeth (MAALOX/MYLANTA) 200-200-20 MG/5ML suspension 30 mL  30 mL Oral Q4H PRN Laveda Abbe, NP      . clonazePAM Scarlette Calico) tablet 2 mg  2 mg Oral QHS Laveda Abbe, NP   2 mg at 02/04/17 2259  . cyclobenzaprine (FLEXERIL) tablet 10 mg  10 mg Oral QHS PRN Craige Cotta, MD   10 mg at 02/04/17 0849  . escitalopram (LEXAPRO) tablet 5 mg  5 mg Oral Daily Craige Cotta, MD   5 mg at 02/05/17 0743  . hydrOXYzine (ATARAX/VISTARIL) tablet 25  mg  25 mg Oral Q6H PRN Laveda Abbe, NP   25 mg at 02/04/17 2027  . lithium carbonate capsule 300 mg  300 mg Oral Daily Craige Cotta, MD   300 mg at 02/05/17 0743  . lithium carbonate capsule 600 mg  600 mg Oral QHS Craige Cotta, MD   600 mg at 02/04/17 2259  . magnesium hydroxide (MILK OF MAGNESIA) suspension 30 mL  30 mL Oral Daily PRN Laveda Abbe, NP      . OLANZapine zydis (ZYPREXA) disintegrating tablet 5 mg  5 mg Oral QHS Craige Cotta, MD   5 mg at 02/04/17 2259    Lab Results:  Results for orders placed or performed during the hospital encounter of 02/02/17 (from the past 48 hour(s))  Urinalysis, Routine w reflex microscopic     Status: Abnormal    Collection Time: 02/04/17  6:30 AM  Result Value Ref Range   Color, Urine AMBER (A) YELLOW    Comment: BIOCHEMICALS MAY BE AFFECTED BY COLOR   APPearance CLOUDY (A) CLEAR   Specific Gravity, Urine 1.027 1.005 - 1.030   pH 5.0 5.0 - 8.0   Glucose, UA NEGATIVE NEGATIVE mg/dL   Hgb urine dipstick NEGATIVE NEGATIVE   Bilirubin Urine NEGATIVE NEGATIVE   Ketones, ur 5 (A) NEGATIVE mg/dL   Protein, ur 30 (A) NEGATIVE mg/dL   Nitrite NEGATIVE NEGATIVE   Leukocytes, UA NEGATIVE NEGATIVE   RBC / HPF 0-5 0 - 5 RBC/hpf   WBC, UA 6-30 0 - 5 WBC/hpf   Bacteria, UA MANY (A) NONE SEEN   Squamous Epithelial / LPF 6-30 (A) NONE SEEN   Mucous PRESENT    Ca Oxalate Crys, UA PRESENT     Comment: Performed at [facility] [address] [place], [place] [address]    Blood Alcohol level:  Lab Results  Component Value Date   [facility] <5 08/12/2016   ETH <5 10/01/2015    Metabolic Disorder Labs: Lab Results  Component Value Date   HGBA1C 4.7 (L) 02/03/2017   MPG 88 02/03/2017   MPG 108 10/31/2015   Lab Results  Component Value Date   PROLACTIN 121.9 (H) 02/03/2017   Lab Results  Component Value Date   CHOL 214 (H) 02/03/2017   TRIG 117 02/03/2017   HDL 42 02/03/2017   CHOLHDL 5.1 02/03/2017   VLDL 23 02/03/2017   LDLCALC 149 (H) 02/03/2017    Physical Findings: AIMS: Facial and Oral Movements Muscles of Facial Expression: None, normal Lips and Perioral Area: None, normal Jaw: None, normal Tongue: None, normal,Extremity Movements Upper (arms, wrists, hands, fingers): None, normal Lower (legs, knees, ankles, toes): None, normal, Trunk Movements Neck, shoulders, hips: None, normal, Overall Severity Severity of abnormal movements (highest score from questions above): None, normal Incapacitation due to abnormal movements: None, normal Patient's awareness of abnormal movements (rate only patient's report): No Awareness, Dental Status Current problems with teeth and/or  dentures?: No Does patient usually wear dentures?: No  CIWA:    COWS:     Musculoskeletal: Strength & Muscle Tone: history of quadriparesia Gait & Station: mobilizes slowly with cane or in wheel chair Patient leans: N/A  Psychiatric Specialty Exam: Physical Exam  ROS no chest pain, no shortness of breath, no vomiting, no dysuria reported, no urgency, no fever, no chills   Blood pressure 103/67, pulse 71, temperature 97.6 F (36.4 C), resp. rate 18, height 5\' 5"  (1.651 m), weight 62.1 kg (137 lb),  last menstrual period 01/22/2017, SpO2 100 %.Body mass index is 22.8 kg/m.  General Appearance: Fairly Groomed  Eye Contact:  Good  Speech:  Normal Rate  Volume:  Decreased  Mood:  Depressed  Affect:  constricted, but smiles briefly at times   Thought Process:  Linear and Descriptions of Associations: Intact  Orientation:  Full (Time, Place, and Person)  Thought Content:  no hallucinations, no delusions, not internally preoccpied   Suicidal Thoughts:  Yes.  without intent/plan Continues to denies active suicidal or self injurious ideations, contracts for safety on unit, denies any homicidal or violent ideations   Homicidal Thoughts:  No  Memory:  recent and remote grossly intact   Judgement:  Fair  Insight:  Present  Psychomotor Activity:  Decreased, but more visible in day room, milieu today  Concentration:  Concentration: Good and Attention Span: Good  Recall:  Good  Fund of Knowledge:  Good  Language:  Good  Akathisia:  Negative  Handed:  Right  AIMS (if indicated):     Assets:  Communication Skills Desire for Improvement Resilience  ADL's:  Intact  Cognition:  WNL  Sleep:  Number of Hours: 5.75   Assessment - patient has history of chronic depression, intermittent suicidal ideations. She is facing significant stressors, mainly related to limited support system other than her mother, who has become progressively more affected by Parknson's Disease . Patient is tolerating  medications well, but reports some restlessness and worsening of depression in spite of high dose of Prozac. She is on Zyprexa- not as mood stabilizer ( denies history of bipolar disorder, or history of psychosis) . States it has been prescribed mostly for sleep and improvement of appetite, which it has helped with . She has been on Lithium for years, feels it has worked , no suicidal attempts since on Lithium.  She is interested in adjusting, changing medication regimen in hopes of improved clinical response. Of note, reports she has a history of having responded to Lexapro in the past. Patient has also responded to ECT in the past, and has been on maintenance ECT regimen with some maintenance of improvement, but states that at this time she does not want to consider restarting an ECT course .   Treatment Plan Summary: Daily contact with patient to assess and evaluate symptoms and progress in treatment, Medication management, Plan inpatient treatment and medications as below Encourage group and milieu participation to work on coping skills and symptom reduction D/C Prozac - see above  Continue  Lexapro 5 mgrs QDAY for depression , anxiety - see above  Decrease Zyprexa to 5 mgrs QHS - will consider tapering off gradually, possibly replacing with Remeron for depression and insomnia  Continue Lithium 300 mgrs QAM and 600 mgrs QHS for mood disorder, antidepressant augmentation,suicidal ideations. Continue Klonopin 2 mgrs QHS for anxiety, insomnia Treatment team working on disposition planning options  Rankin, Shuvon, NP 02/05/2017, 11:30 AM

## 2017-02-05 NOTE — Plan of Care (Signed)
Problem: Safety: Goal: Periods of time without injury will increase Outcome: Progressing Patient has been educated on the importance of requesting assistance when ambulating without wheelchair.  She remains a high fall risk.

## 2017-02-05 NOTE — [facility] Group Notes (Signed)
Best Self   Date:  02/05/2017  Time:  10:02 AM  Type of Therapy:  Nurse Education  /  Identifying One's Best Self:  The group is focused on teaching patients how to identify their " best self" and then identify one tangible step they can take today...towards returning to that self.  Participation Level:  Active  Participation Quality:  Drowsy  Affect:  Anxious  Cognitive:  Alert  Insight:  Lacking  Engagement in Group:  Engaged  Modes of Intervention:  Education  Summary of Progress/Problems:  Rich BraveDuke, Mareta Chesnut Lynn 02/05/2017, 10:02 AM

## 2017-02-05 NOTE — Progress Notes (Signed)
D.  Pt pleasant on approach, denies complaints at this time.  Pt was positive for evening wrap up group, observed interacting appropriately with peers on the unit.  Pt continues to endorse passive SI but contracts for safety on the unit.  PT is a very high fall risk due to poor compliance with continuous use of wheelchair and failure twice on day shift to request assistance from staff before getting out of it.  A.  Support and encouragement offered, medication given as ordered.  Reinforced fall precautions and stressed importance of asking for assistance before getting up without wheelchair.  R.  Pt remains safe on the unit, verbalized understanding of fall precautions.  Will continue to monitor.

## 2017-02-05 NOTE — [facility] Group Notes (Signed)
Adult Therapy Group Note (Clinical Social Work)  Date:  02/05/2017  Time:  10:00-11:10AM  Group Topic/Focus: Unhealthy vs Healthy Coping Techniques  Building Self Esteem:    The focus of this group was to determine what unhealthy coping techniques patients tend to use and what healthy coping techniques various patients would like to learn.   Patients were guided in becoming aware of the differences between healthy and unhealthy coping techniques.  Methods to being learning the desire healthy coping skills were discussed with a focus on sleep routines and acceptance of one's illness. Stages of Change theory was discussed and shown on the whiteboard.    Participation Level:  Active  Participation Quality:  Appropriate, Attentive, Sharing   Affect:  Blunted and Depressed  Cognitive:  Alert and Appropriate  Insight: Improving  Engagement in Group:  Engaged  Modes of Intervention:  Exercise, Discussion and Support  Additional Comments:  The patient expressed that unhealthy coping currently used includes self-harm and shutting down, while healthy coping desired is confronting the anxiety/depression and accepting them.  The patient expressed a willingness to get started on her goal by learning to accept her illness. We discussed affirmations she could say to herself while looking in a mirror.  Ambrose MantleMareida Grossman-Orr, LCSW 02/05/2017   12:29 PM

## 2017-02-05 NOTE — Progress Notes (Signed)
D: Patient observed on telephone this am.  Patient was at nurse's station earlier and she attempted to get out of her wheelchair without assistance and had a near fall.  Patient did not fall to the floor.  She remains a high fall risk and has been advised by staff not to ambulate out of her wheelchair without assistance.  Patient reports hopelessness and depression.  She has ongoing thoughts of self harm.  She denies HI/AVH.  Patient rates her depression and anxiety as a 7; hopelessness as a 10.  She presents with flat affect and depressed mood.   A: Continue to monitor medication management and MD orders. Safety checks continued every 15 minutes per protocol.  Offer support and encouragement as needed. R: Patient is receptive to staff; her behavior is appropriate.

## 2017-02-06 NOTE — Progress Notes (Signed)
D.  Pt pleasant on approach, denies complaints at this time.  Pt was positive for wrap up group, observed in dayroom interacting appropriately with peers on the unit.  PT denies SI/HI/hallucinations at this time.  A.  Support and encouragement offered, medications given as ordered  R.  Pt remains safe on the unit, will continue to monitor.

## 2017-02-06 NOTE — Progress Notes (Signed)
Patient ID: Natasha SartoriusJenifer K Helwig, female   DOB: 1971-07-17, 46 y.o.   MRN: [id number]     D: Pt has been very flat and depressed on the unit today. Pt did not interact with peers or staff. Pt did report some anxiety and requested her Vistaril, patient was given Vistaril which helped with her anxiety. Pt reported that her depression was  6, her hopelessness was a 6, and her anxiety was a 6. Pt reported that her goal for today was to work on participation. Pt reported that she was positive SI, but could contract for safety. Pt reported being negative HI, no AH/VH noted. A: 15 min checks continued for patient safety. R: Pt safety maintained.

## 2017-02-06 NOTE — Progress Notes (Signed)
[facility] MD Progress Note  02/06/2017 10:48 AM Natasha [name]  MRN:  [id number]     Subjective:  Patient reports that she continues to feel depressed. Rates depression 6/10 and anxiety 7/10 (scale 0/none and 10/worse).  Also reports that she continues to have passive suicidal thoughts but is able to contract for safety.  States that her biggest stressor is her mother having Parkinson disease and that she is not in the physical shape that she can help her mother.  Reports that she is tolerating her medications without adverse effect; eating is improving; and sleeping without difficulty.  Denies homicidal ideation,  psychosis, and paranoia   Objective : Reviewed chart and discussed case with treatment team.   Slight improvement in depression from 7/10 to 6/10.  No improvement in anxiety.  Patient is tolerating the changes in medication and sleeping well.   Tolerating the Lexapro without adverse reactions; Consider stopping the Zyprexa tomorrow and starting the Remeron as stated below in assessment)       Principal Problem: MDD (major depressive disorder), recurrent severe, without psychosis (HCC) Diagnosis:   Patient Active Problem List   Diagnosis Date Noted  . MDD (major depressive disorder), recurrent severe, without psychosis (HCC) [F33.2] 02/02/2017  . MDD (major depressive disorder) [F32.9] 10/02/2015  . Severe recurrent major depression without psychotic features (HCC) [F33.2] 06/19/2015   Total Time spent with patient: 15 minutes  Past Medical History:  Past Medical History:  Diagnosis Date  . C6 spinal cord injury (HCC)   . Depression     Past Surgical History:  Procedure Laterality Date  . BACK SURGERY     Family History:  Family History  Problem Relation Age of Onset  . Alcohol abuse Father   . Alcohol abuse Brother   . Depression Mother   . Alcohol abuse Mother    Social History:  History  Alcohol Use No     History  Drug Use No    Social History   Social  History  . Marital status: Single    Spouse name: N/A  . Number of children: N/A  . Years of education: N/A   Social History Main Topics  . Smoking status: Never Smoker  . Smokeless tobacco: Never Used     Comment: No smoking hx; no need for cessation materials  . Alcohol use No  . Drug use: No  . Sexual activity: No   Other Topics Concern  . None   Social History Narrative  . None   Additional Social History:    Pain Medications: pt denies abuse - see pta meds list Prescriptions: pt denies abuse - see pta meds list Over the Counter: pt denies abuse - see pta meds list History of alcohol / drug use?: No history of alcohol / drug abuse Longest period of sobriety (when/how long): n/a  Sleep: Good  Appetite:  Fair, Improving  Current Medications: Current Facility-Administered Medications  Medication Dose Route Frequency Provider Last Rate Last Dose  . acetaminophen (TYLENOL) tablet 650 mg  650 mg Oral Q6H PRN Laveda Abbe, NP      . alum & mag hydroxide-simeth (MAALOX/MYLANTA) 200-200-20 MG/5ML suspension 30 mL  30 mL Oral Q4H PRN Laveda Abbe, NP      . clonazePAM Scarlette Calico) tablet 2 mg  2 mg Oral QHS Laveda Abbe, NP   2 mg at 02/05/17 2232  . cyclobenzaprine (FLEXERIL) tablet 10 mg  10 mg Oral QHS PRN Craige Cotta, MD  10 mg at 02/04/17 0849  . escitalopram (LEXAPRO) tablet 5 mg  5 mg Oral Daily Craige CottaFernando A Cobos, MD   5 mg at 02/06/17 0816  . hydrOXYzine (ATARAX/VISTARIL) tablet 25 mg  25 mg Oral Q6H PRN Laveda AbbeLaurie Britton Parks, NP   25 mg at 02/04/17 2027  . lithium carbonate capsule 300 mg  300 mg Oral Daily Craige CottaFernando A Cobos, MD   300 mg at 02/06/17 0816  . lithium carbonate capsule 600 mg  600 mg Oral QHS Craige CottaFernando A Cobos, MD   600 mg at 02/05/17 2232  . magnesium hydroxide (MILK OF MAGNESIA) suspension 30 mL  30 mL Oral Daily PRN Laveda AbbeLaurie Britton Parks, NP      . OLANZapine zydis (ZYPREXA) disintegrating tablet 5 mg  5 mg Oral QHS Craige CottaFernando A  Cobos, MD   5 mg at 02/05/17 2232    Lab Results:  No results found for this or any previous visit (from the past 48 hour(s)).  Blood Alcohol level:  Lab Results  Component Value Date   [facility] <[facility] 08/12/2016   ETH <5 10/01/2015    Metabolic Disorder Labs: Lab Results  Component Value Date   HGBA1C 4.7 (L) 02/03/2017   MPG 88 02/03/2017   MPG 108 10/31/2015   Lab Results  Component Value Date   PROLACTIN 121.9 (H) 02/03/2017   Lab Results  Component Value Date   CHOL 214 (H) 02/03/2017   TRIG 117 02/03/2017   HDL 42 02/03/2017   CHOLHDL 5.1 02/03/2017   VLDL 23 02/03/2017   LDLCALC 149 (H) 02/03/2017    Physical Findings: AIMS: Facial and Oral Movements Muscles of Facial Expression: None, normal Lips and Perioral Area: None, normal Jaw: None, normal Tongue: None, normal,Extremity Movements Upper (arms, wrists, hands, fingers): None, normal Lower (legs, knees, ankles, toes): None, normal, Trunk Movements Neck, shoulders, hips: None, normal, Overall Severity Severity of abnormal movements (highest score from questions above): None, normal Incapacitation due to abnormal movements: None, normal Patient's awareness of abnormal movements (rate only patient's report): No Awareness, Dental Status Current problems with teeth and/or dentures?: No Does patient usually wear dentures?: No  CIWA:    COWS:     Musculoskeletal: Strength & Muscle Tone: history of quadriparesia Gait & Station: mobilizes slowly with cane or in wheel chair Patient leans: N/A  Psychiatric Specialty Exam: Physical Exam  Nursing note and vitals reviewed.   ROS no chest pain, no shortness of breath, no vomiting, no dysuria reported, no urgency, no fever, no chills   Blood pressure 98/62, pulse 74, temperature 97.8 F (36.6 C), temperature source Oral, resp. rate 16, height 5\' 5"  (1.651 m), weight 62.1 kg (137 lb), last menstrual period 01/22/2017, SpO2 100 %.Body mass index is 22.8 kg/m.  General  Appearance: Fairly Groomed  Eye Contact:  Good  Speech:  Normal Rate  Volume:  Decreased  Mood:  Depressed  Affect:  constricted, but smiles briefly at times   Thought Process:  Linear and Descriptions of Associations: Intact  Orientation:  Full (Time, Place, and Person)  Thought Content:  no hallucinations, no delusions, not internally preoccpied   Suicidal Thoughts:  Yes.  without intent/plan Continues to denies active suicidal or self injurious ideations, contracts for safety on unit, denies any homicidal or violent ideations   Homicidal Thoughts:  No  Memory:  recent and remote grossly intact   Judgement:  Fair  Insight:  Present  Psychomotor Activity:  Decreased  Concentration:  Concentration: Good and Attention Span:  Good  Recall:  Good  Fund of Knowledge:  Good  Language:  Good  Akathisia:  Negative  Handed:  Right  AIMS (if indicated):     Assets:  Communication Skills Desire for Improvement Resilience  ADL's:  Intact  Cognition:  WNL  Sleep:  Number of Hours: 5.5   Assessment - patient has history of chronic depression, intermittent suicidal ideations. She is facing significant stressors, mainly related to limited support system other than her mother, who has become progressively more affected by Parkinson's Disease . Patient is tolerating medications well, but reports some restlessness and worsening of depression in spite of high dose of Prozac. She is on Zyprexa- not as mood stabilizer ( denies history of bipolar disorder, or history of psychosis) . States it has been prescribed mostly for sleep and improvement of appetite, which it has helped with . She has been on Lithium for years, feels it has worked , no suicidal attempts since on Lithium.  She is interested in adjusting, changing medication regimen in hopes of improved clinical response. Of note, reports she has a history of having responded to Lexapro in the past. Patient has also responded to ECT in the past, and has  been on maintenance ECT regimen with some maintenance of improvement, but states that at this time she does not want to consider restarting an ECT course .   Continue with current treatment plan; no changes at this time.  Consider discontinuing Zyprexa tomorrow and starting Remeron.    Treatment Plan Summary: Daily contact with patient to assess and evaluate symptoms and progress in treatment, Medication management, Plan inpatient treatment and medications as below Encourage group and milieu participation to work on coping skills and symptom reduction D/C Prozac - see above  Continue  Lexapro 5 mgrs QDAY for depression , anxiety - see above  Decrease Zyprexa to 5 mgrs QHS - will consider tapering off gradually, possibly replacing with Remeron for depression and insomnia  Continue Lithium 300 mgrs QAM and 600 mgrs QHS for mood disorder, antidepressant augmentation,suicidal ideations. Continue Klonopin 2 mgrs QHS for anxiety, insomnia Treatment team working on disposition planning options   Aulani Shipton, NP 02/06/2017, 10:48 AM

## 2017-02-06 NOTE — Plan of Care (Signed)
Problem: Medication: Goal: Compliance with prescribed medication regimen will improve Outcome: Progressing Pt has been compliant with medication regimen   

## 2017-02-06 NOTE — [facility] Group Notes (Signed)
[facility] Group Notes:  (Nursing/MHT/Case Management/Adjunct)  Date:  02/06/2017  Time:  0900 am  Type of Therapy:  Psychoeducational Skills  Participation Level:  Did Not Attend  Patient invited; declined to attend.  Cranford MonBeaudry, Lyndzee Kliebert Evans 02/06/2017, 9:40 AM

## 2017-02-06 NOTE — [facility] Group Notes (Signed)
[facility] Group Notes:  (Clinical Social Work)   02/06/2017    10:00-11:00AM  Summary of Progress/Problems:   The main focus of today's process group was to   1)  discuss the importance of adding supports  2)  define health supports versus unhealthy supports  3)  identify the patient's current unhealthy supports and plan how to handle them  4)  Identify the patient's current healthy supports and plan what to add.  An emphasis was placed on using counselor, doctor, therapy groups, 12-step groups, and problem-specific support groups to expand supports.    The patient expressed full comprehension of the concepts presented, and agreed that there is a need to add more supports.  The patient stated her mother is both healthy and unhealthy for her in different ways, and it seems they are co-dependents.  She stated her brother is very judgmental and unhealthy for her.  She was very drowsy.  Type of Therapy:  Process Group with Motivational Interviewing  Participation Level:  Minimal  Participation Quality:  Drowsy  Affect:  Flat  Cognitive:  Oriented  Insight:  Limited  Engagement in Therapy:  Limited  Modes of Intervention:   Education, Support and Processing  Ambrose MantleMareida Grossman-Orr, LCSW 02/06/2017    3:19 PM

## 2017-02-06 NOTE — Progress Notes (Signed)
Adult Psychoeducational Group Note  Date:  02/06/2017 Time:  10:04 PM  Group Topic/Focus:  Wrap-Up Group:   The focus of this group is to help patients review their daily goal of treatment and discuss progress on daily workbooks.  Participation Level:  Active  Participation Quality:  Appropriate  Affect:  Appropriate  Cognitive:  Appropriate  Insight: Appropriate  Engagement in Group:  Engaged  Modes of Intervention:  Discussion  Additional Comments:  Patient attended wrap-up group and said that her day was a 5. She was excited that she was able to come out of her room and socialize.    Rillie Riffel W Lupie Sawa 02/06/2017, 10:04 PM

## 2017-02-07 NOTE — Progress Notes (Signed)
Recreation Therapy Notes  Date: 02/07/17 Time: 0930 Location: 300 Hall Dayroom  Group Topic: Stress Management  Goal Area(s) Addresses:  Patient will verbalize importance of using healthy stress management.  Patient will identify positive emotions associated with healthy stress management.   Behavioral Response: Engaged  Intervention: Stress Management  Activity :  Forest Visualization.  LRT introduced the stress management technique of guided imagery.  LRT read [name] script to allow patients to engage in the activity.  Patients were to follow along as LRT read script to participate in activity.  Education:  Stress Management, Discharge Planning.   Education Outcome: Acknowledges edcuation/In group clarification offered/Needs additional education  Clinical Observations/Feedback: Pt attended group.    Natasha [name], LRT/CTRS         Natasha [name] 02/07/2017 1:14 PM 

## 2017-02-07 NOTE — Progress Notes (Signed)
Patient ID: [name], female   DOB: [birth year], 46 y.o.   MRN: [id number] PER STATE REGULATIONS 482.30  THIS CHART WAS REVIEWED FOR MEDICAL NECESSITY WITH RESPECT TO THE PATIENT'S ADMISSION/ DURATION OF STAY.  NEXT REVIEW DATE: 02/10/2017  Willa RoughJENNIFER [name] Natasha Drier, RN, BSN CASE MANAGER

## 2017-02-07 NOTE — [facility] Suicide Risk Assessment (Signed)
[facility] INPATIENT:  Family/Significant Other Suicide Prevention Education  Suicide Prevention Education:  Contact Attempts: Natasha [name], Pt's mother [phone], (name of family member/significant other) has been identified by the patient as the family member/significant other with whom the patient will be residing, and identified as the person(s) who will aid the patient in the event of a mental health crisis.  With written consent from the patient, two attempts were made to provide suicide prevention education, prior to and/or following the patient's discharge.  We were unsuccessful in providing suicide prevention education.  A suicide education pamphlet was given to the patient to share with family/significant other.  Date and time of first attempt: 02/04/17 @ 3:45pm; voicemail left Date and time of second attempt: 02/07/17 @ 12:35pm; voicemail left  Natasha [name] 02/07/2017, 12:34 PM

## 2017-02-07 NOTE — Progress Notes (Signed)
Patient ID: Natasha SartoriusJenifer K [name], female   DOB: Jun 19, 1971, 46 y.o.   MRN: [id number]  DAR: Pt. Denies HI and A/V Hallucinations. She reports passive SI which is "pretty persistent" however reports she is able to contract for safety. She reports sleep is fair, appetite is fair, energy level is low, and concentration is poor. She rates depression 7/10, hopelessness 10/10, and anxiety 7/10. Patient does report a headache and received PRN Tylenol. She is seen in the milieu throughout most of the morning utilizing her wheelchair. Support and encouragement provided to the patient. Scheduled medications administered to patient per physician's orders. Patient is minimal with this Clinical research associatewriter but cooperative. Her affect continues to be sullen and mood is depressed. She reports her goal today, per her daily inventory sheet, is to socialize and she reports she will meet this goal by staying out of her room. Q15 minute checks are maintained for safety.

## 2017-02-07 NOTE — Progress Notes (Signed)
Patient ID: Melven SartoriusJenifer K Tash, female   DOB: [birth year], 46 y.o.   MRN: [id number]  Writer spoke 1:1 with patient this afternoon. She reports she's feeling "down" today. She reports she is worrying about her mom as her health is declining and patient's health is not the best. She reports her medication was changed to Lexapro two days ago but she reports no change in mood. However, did state she is aware that this medication will take time. She speaks about the stress of her mother and possibly herself needing assisted living and having to sell their how. She reports she has a lot of depression and anxiety surrounding this topic. She reports ECT does help however she was cancelling appointments stating, "it's a lot to get up and get dressed and get there." She also reports it's hard to do this to even go out for a meal or shopping. Writer encouraged patient, on a nice day, to utilize her wheelchair to get outside and sit on her porch in order to see the outdoors and experience nature. Patient reported, "that sounds nice." Writer informed patient that she did not need to necessarily get dressed up for this occasion and patient also suggested that she loves to read so she could try that. Writer encouraged patient not to isolate in her room, and come into the milieu. NP May A. Was notified of the way patient was feeling in regards to her medication.

## 2017-02-07 NOTE — [facility] Group Notes (Signed)
[facility] LCSW Group Therapy  02/07/2017 1:15pm  Type of Therapy:  Group Therapy vercoming Obstacles  Pt did not attend, declined invitation.   Vernie ShanksLauren Jeanenne Licea, LCSW 02/07/2017 3:12 PM

## 2017-02-07 NOTE — Progress Notes (Signed)
[facility] MD Progress Note  02/07/2017 11:20 AM Natasha [name]  MRN:  [id number]   Subjective:  Patient reports that she is doing ok.  She slept fair last night and is the day room interacting with fellow patients.  Patient appears reserved and quiet which may be baseline for her.  Denies homicidal ideation,  psychosis, and paranoia  Objective : Reviewed chart and discussed case with treatment team.  She is compliant with meds, no side effects.  She is on Lexapro.  Still is flat and forwards little about her feelings today.    Principal Problem: MDD (major depressive disorder), recurrent severe, without psychosis (HCC) Diagnosis:   Patient Active Problem List   Diagnosis Date Noted  . Severe recurrent major depression without psychotic features (HCC) [F33.2] 06/19/2015    Priority: High  . MDD (major depressive disorder), recurrent severe, without psychosis (HCC) [F33.2] 02/02/2017  . MDD (major depressive disorder) [F32.9] 10/02/2015   Total Time spent with patient: 15 minutes  Past Medical History:  Past Medical History:  Diagnosis Date  . C6 spinal cord injury (HCC)   . Depression     Past Surgical History:  Procedure Laterality Date  . BACK SURGERY     Family History:  Family History  Problem Relation Age of Onset  . Alcohol abuse Father   . Alcohol abuse Brother   . Depression Mother   . Alcohol abuse Mother    Social History:  History  Alcohol Use No     History  Drug Use No    Social History   Social History  . Marital status: Single    Spouse name: N/A  . Number of children: N/A  . Years of education: N/A   Social History Main Topics  . Smoking status: Never Smoker  . Smokeless tobacco: Never Used     Comment: No smoking hx; no need for cessation materials  . Alcohol use No  . Drug use: No  . Sexual activity: No   Other Topics Concern  . None   Social History Narrative  . None   Additional Social History:    Pain Medications: pt denies abuse -  see pta meds list Prescriptions: pt denies abuse - see pta meds list Over the Counter: pt denies abuse - see pta meds list History of alcohol / drug use?: No history of alcohol / drug abuse Longest period of sobriety (when/how long): n/a  Sleep: Good  Appetite:  Fair, Improving  Current Medications: Current Facility-Administered Medications  Medication Dose Route Frequency Provider Last Rate Last Dose  . acetaminophen (TYLENOL) tablet 650 mg  650 mg Oral Q6H PRN Laveda Abbe, NP   650 mg at 02/07/17 0950  . alum & mag hydroxide-simeth (MAALOX/MYLANTA) 200-200-20 MG/5ML suspension 30 mL  30 mL Oral Q4H PRN Laveda Abbe, NP      . clonazePAM Scarlette Calico) tablet 2 mg  2 mg Oral QHS Laveda Abbe, NP   2 mg at 02/06/17 2234  . cyclobenzaprine (FLEXERIL) tablet 10 mg  10 mg Oral QHS PRN Craige Cotta, MD   10 mg at 02/04/17 0849  . escitalopram (LEXAPRO) tablet 5 mg  5 mg Oral Daily Craige Cotta, MD   5 mg at 02/07/17 0737  . hydrOXYzine (ATARAX/VISTARIL) tablet 25 mg  25 mg Oral Q6H PRN Laveda Abbe, NP   25 mg at 02/06/17 1613  . lithium carbonate capsule 300 mg  300 mg Oral Daily Fernando A Cobos,  MD   300 mg at 02/07/17 0737  . lithium carbonate capsule 600 mg  600 mg Oral QHS Craige Cotta, MD   600 mg at 02/06/17 2234  . magnesium hydroxide (MILK OF MAGNESIA) suspension 30 mL  30 mL Oral Daily PRN Laveda Abbe, NP      . OLANZapine zydis (ZYPREXA) disintegrating tablet 5 mg  5 mg Oral QHS Craige Cotta, MD   5 mg at 02/06/17 2234    Lab Results:  No results found for this or any previous visit (from the past 48 hour(s)).  Blood Alcohol level:  Lab Results  Component Value Date   [facility] <5 08/12/2016   ETH <5 10/01/2015    Metabolic Disorder Labs: Lab Results  Component Value Date   HGBA1C 4.7 (L) 02/03/2017   MPG 88 02/03/2017   MPG 108 10/31/2015   Lab Results  Component Value Date   PROLACTIN 121.9 (H) 02/03/2017   Lab  Results  Component Value Date   CHOL 214 (H) 02/03/2017   TRIG 117 02/03/2017   HDL 42 02/03/2017   CHOLHDL 5.1 02/03/2017   VLDL 23 02/03/2017   LDLCALC 149 (H) 02/03/2017    Physical Findings: AIMS: Facial and Oral Movements Muscles of Facial Expression: None, normal Lips and Perioral Area: None, normal Jaw: None, normal Tongue: None, normal,Extremity Movements Upper (arms, wrists, hands, fingers): None, normal Lower (legs, knees, ankles, toes): None, normal, Trunk Movements Neck, shoulders, hips: None, normal, Overall Severity Severity of abnormal movements (highest score from questions above): None, normal Incapacitation due to abnormal movements: None, normal Patient's awareness of abnormal movements (rate only patient's report): No Awareness, Dental Status Current problems with teeth and/or dentures?: No Does patient usually wear dentures?: No  CIWA:    COWS:     Musculoskeletal: Strength & Muscle Tone: history of quadriparesia Gait & Station: mobilizes slowly with cane or in wheel chair Patient leans: N/A  Psychiatric Specialty Exam: Physical Exam  Nursing note and vitals reviewed. Psychiatric: She exhibits a depressed mood.    ROS no chest pain, no shortness of breath, no vomiting, no dysuria reported, no urgency, no fever, no chills   Blood pressure (!) 122/94, pulse 62, temperature 97.7 F (36.5 C), temperature source Oral, resp. rate 18, height 5\' 5"  (1.651 m), weight 62.1 kg (137 lb), last menstrual period 01/22/2017, SpO2 100 %.Body mass index is 22.8 kg/m.  General Appearance: Fairly Groomed  Eye Contact:  Good  Speech:  Normal Rate  Volume:  Decreased  Mood:  Depressed  Affect:  Flat and constricted  Thought Process:  Linear and Descriptions of Associations: Intact  Orientation:  Full (Time, Place, and Person)  Thought Content:  no hallucinations, no delusions, not internally preoccpied   Suicidal Thoughts:  No denies  Homicidal Thoughts:  No denies   Memory:  recent and remote grossly intact   Judgement:  Fair  Insight:  Present  Psychomotor Activity:  Decreased  Concentration:  Concentration: Good and Attention Span: Good  Recall:  Good  Fund of Knowledge:  Good  Language:  Good  Akathisia:  Negative  Handed:  Right  AIMS (if indicated):     Assets:  Communication Skills Desire for Improvement Resilience  ADL's:  Intact  Cognition:  WNL  Sleep:  Number of Hours: 6.25   Assessment - patient has history of chronic depression, intermittent suicidal ideations due to family stressors.   Continue with current treatment plan; no changes at this time.  Consider  discontinuing Zyprexa tomorrow and starting Remeron will defer to Dr Jama Flavorsobos in the am.  Patient does not want to change her medication regimen today.    Treatment Plan Summary: Daily contact with patient to assess and evaluate symptoms and progress in treatment, Medication management, Plan inpatient treatment and medications as below as of 02/07/17.   Encourage group and milieu participation to work on coping skills and symptom reduction Continue  Lexapro 5 mgrs QDAY for depression , anxiety.  Prozac was less effective and was d/c.   Cont Zyprexa to 5 mgrs QHS - to help with sleep and appetite, no Bipolar diagnosis.  Continue Lithium 300 mgrs QAM and 600 mgrs QHS for mood disorder, antidepressant augmentation,suicidal ideations.   Continue Klonopin 2 mgrs QHS for anxiety, insomnia Treatment team working on disposition planning options   Lindwood QuaSheila May Jarek Longton, NP [facility] 02/07/2017, 11:20 AM

## 2017-02-08 MED ORDER — ESCITALOPRAM OXALATE 10 MG PO TABS
10.0000 mg | ORAL_TABLET | Freq: Every day | ORAL | Status: DC
  Administered 2017-02-09 – 2017-02-16 (×8): 10 mg via ORAL
  Filled 2017-02-08 (×10): qty 1

## 2017-02-08 MED ORDER — ARIPIPRAZOLE 5 MG PO TABS
5.0000 mg | ORAL_TABLET | Freq: Every day | ORAL | Status: DC
  Administered 2017-02-08 – 2017-02-14 (×7): 5 mg via ORAL
  Filled 2017-02-08 (×9): qty 1

## 2017-02-08 NOTE — [facility] Group Notes (Signed)

## 2017-02-08 NOTE — Progress Notes (Signed)
[facility] MD Progress Note  02/08/2017 7:04 PM Natasha [name]  MRN:  [id number]   Subjective:  P:atient reports some improvement compared to admission, but feels she is still significantly depressed, and reports neuro-vegetative symptoms such as ongoing sense of anhedonia, decreased energy level. Describes some anxiety and hopelessness. Denies suicidal ideations at this time and contracts for safety on unit at this time. Denies medication side effects. Of note, has gained significant weight over recent weeks- months  ,which she feels may be related to Zyprexa .   Objective : I have discussed case with treatment team and have met with patient . Patient presents with some improvement, but overall remains depressed, with constricted affect, has history of chronic, recurrent passive SI, but at this time denies any SI or any self injurious ideations, contracts for safety on unit. No disruptive or agitated behaviors on unit. Going to some groups.   Principal Problem: MDD (major depressive disorder), recurrent severe, without psychosis ([place]) Diagnosis:   Patient Active Problem List   Diagnosis Date Noted  . MDD (major depressive disorder), recurrent severe, without psychosis ([place]) [F33.2] 02/02/2017  . MDD (major depressive disorder) [F32.9] 10/02/2015  . Severe recurrent major depression without psychotic features ([place]) [F33.2] 06/19/2015   Total Time spent with patient: 20 minutes  Past Medical History:  Past Medical History:  Diagnosis Date  . C6 spinal cord injury ([place])   . Depression     Past Surgical History:  Procedure Laterality Date  . BACK SURGERY     Family History:  Family History  Problem Relation Age of Onset  . Alcohol abuse Father   . Alcohol abuse Brother   . Depression Mother   . Alcohol abuse Mother    Social History:  History  Alcohol Use No     History  Drug Use No    Social History   Social History  . Marital status: Single    Spouse name: N/A  . Number  of children: N/A  . Years of education: N/A   Social History Main Topics  . Smoking status: Never Smoker  . Smokeless tobacco: Never Used     Comment: No smoking hx; no need for cessation materials  . Alcohol use No  . Drug use: No  . Sexual activity: No   Other Topics Concern  . None   Social History Narrative  . None   Additional Social History:    Pain Medications: pt denies abuse - see pta meds list Prescriptions: pt denies abuse - see pta meds list Over the Counter: pt denies abuse - see pta meds list History of alcohol / drug use?: No history of alcohol / drug abuse Longest period of sobriety (when/how long): n/a  Sleep: Good  Appetite:  Improving   Current Medications: Current Facility-Administered Medications  Medication Dose Route Frequency Provider Last Rate Last Dose  . acetaminophen (TYLENOL) tablet 650 mg  650 mg Oral Q6H PRN Ethelene Hal, NP   650 mg at 02/07/17 0950  . alum & mag hydroxide-simeth (MAALOX/MYLANTA) 200-200-20 MG/5ML suspension 30 mL  30 mL Oral Q4H PRN Ethelene Hal, NP      . ARIPiprazole (ABILIFY) tablet 5 mg  5 mg Oral Daily [name], MD   5 mg at 02/08/17 1507  . clonazePAM (KLONOPIN) tablet 2 mg  2 mg Oral QHS Ethelene Hal, NP   2 mg at 02/07/17 2256  . cyclobenzaprine (FLEXERIL) tablet 10 mg  10 mg Oral QHS  PRN [name], MD   10 mg at 02/04/17 0849  . [START ON 02/09/2017] escitalopram (LEXAPRO) tablet 10 mg  10 mg Oral Daily [name] Harlym Gehling, MD      . hydrOXYzine (ATARAX/VISTARIL) tablet 25 mg  25 mg Oral Q6H PRN Ethelene Hal, NP   25 mg at 02/06/17 1613  . lithium carbonate capsule 300 mg  300 mg Oral Daily [name], MD   300 mg at 02/08/17 0810  . lithium carbonate capsule 600 mg  600 mg Oral QHS [name], MD   600 mg at 02/07/17 2256  . magnesium hydroxide (MILK OF MAGNESIA) suspension 30 mL  30 mL Oral Daily PRN Ethelene Hal, NP        Lab Results:  No results  found for this or any previous visit (from the past 48 hour(s)).  Blood Alcohol level:  Lab Results  Component Value Date   [facility] <5 08/12/2016   ETH <5 27/05/2375    Metabolic Disorder Labs: Lab Results  Component Value Date   HGBA1C 4.7 (L) 02/03/2017   MPG 88 02/03/2017   MPG 108 10/31/2015   Lab Results  Component Value Date   PROLACTIN 121.9 (H) 02/03/2017   Lab Results  Component Value Date   CHOL 214 (H) 02/03/2017   TRIG 117 02/03/2017   HDL 42 02/03/2017   CHOLHDL 5.1 02/03/2017   VLDL 23 02/03/2017   LDLCALC 149 (H) 02/03/2017    Physical Findings: AIMS: Facial and Oral Movements Muscles of Facial Expression: None, normal Lips and Perioral Area: None, normal Jaw: None, normal Tongue: None, normal,Extremity Movements Upper (arms, wrists, hands, fingers): None, normal Lower (legs, knees, ankles, toes): None, normal, Trunk Movements Neck, shoulders, hips: None, normal, Overall Severity Severity of abnormal movements (highest score from questions above): None, normal Incapacitation due to abnormal movements: None, normal Patient's awareness of abnormal movements (rate only patient's report): No Awareness, Dental Status Current problems with teeth and/or dentures?: No Does patient usually wear dentures?: No  CIWA:    COWS:     Musculoskeletal: Strength & Muscle Tone: history of quadriparesia Gait & Station: currently mobilizing in wheel chair  Patient leans: N/A  Psychiatric Specialty Exam: Physical Exam  Nursing note and vitals reviewed. Psychiatric: She exhibits a depressed mood.    ROS no headache, no chest pain, no shortness of breath   Blood pressure (!) 98/59, pulse (!) 16, temperature 98.5 F (36.9 C), temperature source Oral, resp. rate 16, height 5' 5"  (1.651 m), weight 62.1 kg (137 lb), last menstrual period 01/22/2017, SpO2 100 %.Body mass index is 22.8 kg/m.  General Appearance: Fairly Groomed  Eye Contact:  Good  Speech:  Normal Rate   Volume:  Normal  Mood:  remains depressed   Affect:  Constricted and constricted  Thought Process:  Goal Directed and Descriptions of Associations: Intact  Orientation:  Full (Time, Place, and Person)  Thought Content:   No hallucinations, no delusions   Suicidal Thoughts:  No denies any suicidal or self injurious ideations at this time and contracts for safety on unit   Homicidal Thoughts:  No denies  Memory:  recent and remote grossly intact   Judgement:  Other:  improving  Insight:  Present  Psychomotor Activity:  improved, mobilizes with wheel chair   Concentration:  Concentration: Good and Attention Span: Good  Recall:  Good  Fund of Knowledge:  Good  Language:  Good  Akathisia:  Negative  Handed:  Right  AIMS (if indicated):     Assets:  Resilience  ADL's:  Intact  Cognition:  WNL  Sleep:  Number of Hours: 4.75   Assessment - Patient remains depressed, sad, with constricted affect, although denies any suicidal ideations and contracts for safety on unit. No psychotic symptoms. Tolerating medications well, but there has been some weight gain , likely associated with Zyprexa, which could further hinder her mobility, based on her history of quadriparesia. As noted, she states that she feels current medications are only partially effective . We discussed options, agrees to abilify trial as antidepressant augmentation . Treatment Plan Summary: Daily contact with patient to assess and evaluate symptoms and progress in treatment, Medication management, Plan inpatient treatment and medications as below as of 02/08/17.   Encourage group and milieu participation to work on coping skills and symptom reduction Increase Lexapro to 10  mgrs QDAY for depression , anxiety.   D/C Zyprexa, see rationale above  Start Abilify 5 mgrs QDAY as antidepressant augmentation  Continue Lithium 300 mgrs QAM and 600 mgrs QHS for mood disorder, antidepressant augmentation,suicidal ideations.   Continue  Klonopin 2 mgrs QHS for anxiety, insomnia Treatment team working on disposition planning options   Neita Garnet, MD  02/08/2017, 7:04 PM   Patient ID: Natasha [name], female   DOB: June 22, 1971, 46 y.o.   MRN: [id number]

## 2017-02-08 NOTE — [facility] Group Notes (Signed)
[facility] LCSW Group Therapy 02/08/2017 1:15pm  Type of Therapy: Group Therapy- Balance in Life  Participation Level: Active   Description of the Group:  The topic for group was balance in life. Today's group focused on defining balance in one's own words, identifying things that can knock one off balance, and exploring healthy ways to maintain balance in life. Group members were asked to provide an example of a time when they felt off balance, describe how they handled that situation,and process healthier ways to regain balance in the future. Group members were asked to share the most important tool for maintaining balance that [facility] and how they plan to apply this method after discharge.  Summary of Patient Progress  Pt states that she feels her life is currently not balanced. Pt states that a warning sign for her that her life is unbalanced is when she starts to become easily frustrated. Pt was able to identify some coping skills that are helpful for her such as stepping away from the situation and taking deep breaths.    Therapeutic Modalities:   Cognitive Behavioral Therapy Solution-Focused Therapy Assertiveness Training   Jonathon JordanLynn B Tonnia Bardin, MSW, [facility] 02/08/2017 2:33 PM

## 2017-02-08 NOTE — Progress Notes (Signed)
Pt in day room in her wheelchair watching TV. Her mood seems less depressed and more engaging. She did go outside with her peers. She states that she's really trying to overcome her depression and not spend so much time isolating herself. Praised pt for her efforts in combating her depression.

## 2017-02-08 NOTE — Progress Notes (Signed)
D: Pt presents in a sad, depressed flat mood. Endorses passive SI with no plan. Denies HI/AVH.   A: Safety checks maintained. Encouraged pt to share with staff if symptoms worsen.  R: Pt able to contract for safety. Verbalized no concerns. Cooperative with meds.

## 2017-02-08 NOTE — [facility] Group Notes (Signed)
Pt stated her day was a 5 because of her depression. That's  What made it bad.

## 2017-02-09 LAB — T4, FREE: Free T4: 0.83 ng/dL (ref 0.61–1.12)

## 2017-02-09 NOTE — Plan of Care (Signed)
Problem: Safety: Goal: Periods of time without injury will increase Outcome: Progressing Client has remained injury free AEB q5315min safety checks.

## 2017-02-09 NOTE — Progress Notes (Signed)
Adult Psychoeducational Group Note  Date:  02/09/2017 Time:  8:31 PM  Group Topic/Focus:  Wrap-Up Group:   The focus of this group is to help patients review their daily goal of treatment and discuss progress on daily workbooks.  Participation Level:  Active  Participation Quality:  Appropriate  Affect:  Appropriate  Cognitive:  Appropriate  Insight: Appropriate  Engagement in Group:  Engaged  Modes of Intervention:  Problem-solving  Additional Comments:  Pt. Shared how she was able to smile and laugh with others on the unit. She is hoping she continues to show progress.    Annell GreeningMonroe, Katelynne Revak Park[place]asina 02/09/2017, 8:31 PMThe focus of this group is to help patients review their daily goal of treatment and discuss progress on daily workbooks.

## 2017-02-09 NOTE — Progress Notes (Signed)
Recreation Therapy Notes  Date: 02/09/17 Time: 0930 Location: 300 Hall Group Room  Group Topic: Stress Management  Goal Area(s) Addresses:  Patient will verbalize importance of using healthy stress management.  Patient will identify positive emotions associated with healthy stress management.   Intervention: Stress Management  Activity :  Meditation.  LRT played a meditation from the Calm App so patients could practice the technique of meditation.  Patients were to follow along as the meditation was played to engage in the activity.  Education:  Stress Management, Discharge Planning.   Education Outcome: Acknowledges edcuation/In group clarification offered/Needs additional education  Clinical Observations/Feedback: Pt did not attend group.    Hetty Linhart, LRT/CTRS         Jonte Wollam A 02/09/2017 12:45 PM 

## 2017-02-09 NOTE — [facility] Group Notes (Signed)
[facility] LCSW Group Therapy 02/09/2017 1:15pm  Type of Therapy: Group Therapy- Feelings Around Relapse and Recovery  Participation Level: Pt invited. Did not attend.  Jonathon JordanLynn B Anouk Critzer, MSW, Theresia MajorsLCSWA [phone] 02/09/2017 3:22 PM

## 2017-02-09 NOTE — Progress Notes (Signed)
[facility] MD Progress Note  02/09/2017 11:03 AM Natasha [name]  MRN:  [id number]   Subjective:  Patient appears improved.  Still quiet and somewhat flat affect.  Patient was in the process trying to make a phone call to her mom.  Objective: I have discussed case with treatment team and have met with patient. No disruptive behaviors.  Interacting well with others.  States that although improved, she does not feel ready for discharge and her meds are being adjusted.  Principal Problem: MDD (major depressive disorder), recurrent severe, without psychosis ([place]) Diagnosis:   Patient Active Problem List   Diagnosis Date Noted  . Severe recurrent major depression without psychotic features ([place]) [F33.2] 06/19/2015    Priority: High  . MDD (major depressive disorder), recurrent severe, without psychosis ([place]) [F33.2] 02/02/2017  . MDD (major depressive disorder) [F32.9] 10/02/2015   Total Time spent with patient: 20 minutes  Past Medical History:  Past Medical History:  Diagnosis Date  . C6 spinal cord injury ([place])   . Depression     Past Surgical History:  Procedure Laterality Date  . BACK SURGERY     Family History:  Family History  Problem Relation Age of Onset  . Alcohol abuse Father   . Alcohol abuse Brother   . Depression Mother   . Alcohol abuse Mother    Social History:  History  Alcohol Use No     History  Drug Use No    Social History   Social History  . Marital status: Single    Spouse name: N/A  . Number of children: N/A  . Years of education: N/A   Social History Main Topics  . Smoking status: Never Smoker  . Smokeless tobacco: Never Used     Comment: No smoking hx; no need for cessation materials  . Alcohol use No  . Drug use: No  . Sexual activity: No   Other Topics Concern  . None   Social History Narrative  . None   Additional Social History:    Pain Medications: pt denies abuse - see pta meds list Prescriptions: pt denies abuse - see pta meds  list Over the Counter: pt denies abuse - see pta meds list History of alcohol / drug use?: No history of alcohol / drug abuse Longest period of sobriety (when/how long): n/a  Sleep: Good  Appetite:  Improving   Current Medications: Current Facility-Administered Medications  Medication Dose Route Frequency Provider Last Rate Last Dose  . acetaminophen (TYLENOL) tablet 650 mg  650 mg Oral Q6H PRN Ethelene Hal, NP   650 mg at 02/07/17 0950  . alum & mag hydroxide-simeth (MAALOX/MYLANTA) 200-200-20 MG/5ML suspension 30 mL  30 mL Oral Q4H PRN Ethelene Hal, NP      . ARIPiprazole (ABILIFY) tablet 5 mg  5 mg Oral Daily [name], MD   5 mg at 02/09/17 0846  . clonazePAM (KLONOPIN) tablet 2 mg  2 mg Oral QHS Ethelene Hal, NP   2 mg at 02/08/17 2302  . cyclobenzaprine (FLEXERIL) tablet 10 mg  10 mg Oral QHS PRN [name], MD   10 mg at 02/04/17 0849  . escitalopram (LEXAPRO) tablet 10 mg  10 mg Oral Daily [name], MD   10 mg at 02/09/17 0846  . hydrOXYzine (ATARAX/VISTARIL) tablet 25 mg  25 mg Oral Q6H PRN Ethelene Hal, NP   25 mg at 02/06/17 1613  . lithium carbonate capsule 300 mg  300 mg Oral Daily [name], MD   300 mg at 02/09/17 0846  . lithium carbonate capsule 600 mg  600 mg Oral QHS [name], MD   600 mg at 02/08/17 2302  . magnesium hydroxide (MILK OF MAGNESIA) suspension 30 mL  30 mL Oral Daily PRN Ethelene Hal, NP        Lab Results:  Results for orders placed or performed during the hospital encounter of 02/02/17 (from the past 48 hour(s))  T4, free     Status: None   Collection Time: 02/09/17  6:38 AM  Result Value Ref Range   Free T4 0.83 0.61 - 1.12 ng/dL    Comment: (NOTE) Biotin ingestion may interfere with free T4 tests. If the results are inconsistent with the TSH level, previous test results, or the clinical presentation, then consider biotin interference. If needed, order repeat testing after  stopping biotin. Performed at [facility], [place] [address] [place], [place] [address]     Blood Alcohol level:  Lab Results  Component Value Date   [facility] <5 08/12/2016   ETH <5 32/54/9826    Metabolic Disorder Labs: Lab Results  Component Value Date   HGBA1C 4.7 (L) 02/03/2017   MPG 88 02/03/2017   MPG 108 10/31/2015   Lab Results  Component Value Date   PROLACTIN 121.9 (H) 02/03/2017   Lab Results  Component Value Date   CHOL 214 (H) 02/03/2017   TRIG 117 02/03/2017   HDL 42 02/03/2017   CHOLHDL 5.1 02/03/2017   VLDL 23 02/03/2017   LDLCALC 149 (H) 02/03/2017    Physical Findings: AIMS: Facial and Oral Movements Muscles of Facial Expression: None, normal Lips and Perioral Area: None, normal Jaw: None, normal Tongue: None, normal,Extremity Movements Upper (arms, wrists, hands, fingers): None, normal Lower (legs, knees, ankles, toes): None, normal, Trunk Movements Neck, shoulders, hips: None, normal, Overall Severity Severity of abnormal movements (highest score from questions above): None, normal Incapacitation due to abnormal movements: None, normal Patient's awareness of abnormal movements (rate only patient's report): No Awareness, Dental Status Current problems with teeth and/or dentures?: No Does patient usually wear dentures?: No  CIWA:    COWS:     Musculoskeletal: Strength & Muscle Tone: history of quadriparesia Gait & Station: currently mobilizing in wheel chair  Patient leans: N/A  Psychiatric Specialty Exam: Physical Exam  Nursing note and vitals reviewed. Psychiatric: She exhibits a depressed mood.    ROS no headache, no chest pain, no shortness of breath   Blood pressure 97/62, pulse 74, temperature 97.9 F (36.6 C), temperature source Oral, resp. rate 18, height 5' 5"  (1.651 m), weight 62.1 kg (137 lb), last menstrual period 01/22/2017, SpO2 100 %.Body mass index is 22.8 kg/m.  General Appearance: Fairly Groomed  Eye Contact:  Good   Speech:  Normal Rate  Volume:  Normal  Mood:  remains depressed   Affect:  Constricted and constricted  Thought Process:  Goal Directed and Descriptions of Associations: Intact  Orientation:  Full (Time, Place, and Person)  Thought Content:   No hallucinations, no delusions   Suicidal Thoughts:  No denies any suicidal or self injurious ideations at this time and contracts for safety on unit   Homicidal Thoughts:  No denies  Memory:  recent and remote grossly intact   Judgement:  Other:  improving  Insight:  Present  Psychomotor Activity:  improved, mobilizes with wheel chair   Concentration:  Concentration: Good and Attention Span:  Good  Recall:  Good  Fund of Knowledge:  Good  Language:  Good  Akathisia:  Negative  Handed:  Right  AIMS (if indicated):     Assets:  Resilience  ADL's:  Intact  Cognition:  WNL  Sleep:  Number of Hours: 6.5   Assessment - Patient remains depressed, sad, with constricted affect, although denies any suicidal ideations and contracts for safety on unit. No psychotic symptoms. Tolerating medications well, but there has been some weight gain , likely associated with Zyprexa, which could further hinder her mobility, based on her history of quadriparesia. As noted, she states that she feels current medications are only partially effective . We discussed options, agrees to abilify trial as antidepressant augmentation.  Treatment Plan Summary: Daily contact with patient to assess and evaluate symptoms and progress in treatment, Medication management, Plan inpatient treatment and medications as below as of 02/09/17.   Encourage group and milieu participation to work on coping skills and symptom reduction Increase Lexapro to 10  mgrs QDAY for depression , anxiety.   D/C Zyprexa, see rationale above  Start Abilify 5 mgrs QDAY as antidepressant augmentation  Continue Lithium 300 mgrs QAM and 600 mgrs QHS for mood disorder, antidepressant augmentation,suicidal  ideations.   Continue Klonopin 2 mgrs QHS for anxiety, insomnia Treatment team working on disposition planning options   Janett Labella, NP [place] 02/09/2017, 11:03 AM   Agree with NP Progress Note

## 2017-02-09 NOTE — [facility] Group Notes (Signed)
Pt attended spiritual care group on grief and loss facilitated by chaplain Serafin [place]   Group opened with brief discussion and psycho-social ed around grief and loss in relationships and in relation to self - identifying life patterns, circumstances, changes that cause losses. Established group norm of speaking from own life experience. Group goal of establishing open and affirming space for members to share loss and experience with grief, normalize grief experience and provide psycho social education and grief support.     

## 2017-02-09 NOTE — Progress Notes (Signed)
Patient ID: Natasha [name], female   DOB: 03-30-1971, 47 y.o.   MRN: [id number] D: client visible seen in dayroom this shift interacting with peers and watching TV. Client reports goal today: "to stay out of my room and interact" "feel like I met my goal" A: Writer provided emotional support, reviewed medications, administered as ordered. Staff will monitor q28mn for safety. R:Client is safe on the unit, attended group.

## 2017-02-09 NOTE — Tx Team (Signed)
Interdisciplinary Treatment and Diagnostic Plan Update  02/09/2017 Time of Session: 9:30am Natasha [name] MRN: [id number]  Principal Diagnosis: MDD (major depressive disorder), recurrent severe, without psychosis (HCC)  Secondary Diagnoses: Principal Problem:   MDD (major depressive disorder), recurrent severe, without psychosis (HCC) Active Problems:   Severe recurrent major depression without psychotic features (HCC)   Current Medications:  Current Facility-Administered Medications  Medication Dose Route Frequency Provider Last Rate Last Dose  . acetaminophen (TYLENOL) tablet 650 mg  650 mg Oral Q6H PRN Laveda Abbe, NP   650 mg at 02/07/17 0950  . alum & mag hydroxide-simeth (MAALOX/MYLANTA) 200-200-20 MG/5ML suspension 30 mL  30 mL Oral Q4H PRN Laveda Abbe, NP      . ARIPiprazole (ABILIFY) tablet 5 mg  5 mg Oral Daily Craige Cotta, MD   5 mg at 02/09/17 0846  . clonazePAM (KLONOPIN) tablet 2 mg  2 mg Oral QHS Laveda Abbe, NP   2 mg at 02/08/17 2302  . cyclobenzaprine (FLEXERIL) tablet 10 mg  10 mg Oral QHS PRN Craige Cotta, MD   10 mg at 02/04/17 0849  . escitalopram (LEXAPRO) tablet 10 mg  10 mg Oral Daily Craige Cotta, MD   10 mg at 02/09/17 0846  . hydrOXYzine (ATARAX/VISTARIL) tablet 25 mg  25 mg Oral Q6H PRN Laveda Abbe, NP   25 mg at 02/06/17 1613  . lithium carbonate capsule 300 mg  300 mg Oral Daily Craige Cotta, MD   300 mg at 02/09/17 0846  . lithium carbonate capsule 600 mg  600 mg Oral QHS Craige Cotta, MD   600 mg at 02/08/17 2302  . magnesium hydroxide (MILK OF MAGNESIA) suspension 30 mL  30 mL Oral Daily PRN Laveda Abbe, NP        PTA Medications: Prescriptions Prior to Admission  Medication Sig Dispense Refill Last Dose  . Cholecalciferol (VITAMIN D-3 PO) Take 1 tablet by mouth every morning.   Past Week  . clonazePAM (KLONOPIN) 2 MG tablet Take 1 tablet (2 mg total) by mouth at bedtime. 15 tablet 0  02/01/2017  . FLUoxetine (PROZAC) 20 MG capsule Take 3 capsules (60 mg total) by mouth daily. 90 capsule 2 02/02/2017  . ibuprofen (ADVIL,MOTRIN) 200 MG tablet Take 800 mg by mouth every 6 (six) hours as needed for moderate pain.    02/01/2017  . lithium carbonate 300 MG capsule Take 1 in am and 2 at bed time (Patient taking differently: Take 300-600 mg by mouth 2 (two) times daily. Take one capsule in the morning and two at bedtime.) 90 capsule 2 02/02/2017  . OLANZapine (ZYPREXA) 10 MG tablet TAKE 1 TABLET(10 MG) BY MOUTH AT BEDTIME (Patient taking differently: Take 10 mg by mouth at bedtime. ) 30 tablet 0 02/01/2017  . oxymetazoline (AFRIN) 0.05 % nasal spray Place 1 spray into both nostrils 2 (two) times daily as needed for congestion.   unknown  . SUMAtriptan (IMITREX) 25 MG tablet Take 1 tablet (25 mg total) by mouth every 2 (two) hours as needed for migraine or headache. May repeat in 2 hours if headache persists or recurs. 10 tablet 0 unknown    Treatment Modalities: Medication Management, Group therapy, Case management,  1 to 1 session with clinician, Psychoeducation, Recreational therapy.  Patient Stressors: Loss of Pt's pet dog passed away recently  Patient Strengths: Ability for insight Average or above average intelligence Communication skills General fund of knowledge  Physician Treatment  Plan for Primary Diagnosis: MDD (major depressive disorder), recurrent severe, without psychosis (HCC) Long Term Goal(s): Improvement in symptoms so as ready for discharge  Short Term Goals: Ability to verbalize feelings will improve Ability to disclose and discuss suicidal ideas Ability to demonstrate self-control will improve Ability to identify and develop effective coping behaviors will improve Ability to maintain clinical measurements within normal limits will improve Ability to verbalize feelings will improve Ability to disclose and discuss suicidal ideas Ability to demonstrate  self-control will improve Ability to identify and develop effective coping behaviors will improve Ability to maintain clinical measurements within normal limits will improve  Medication Management: Evaluate patient's response, side effects, and tolerance of medication regimen.  Therapeutic Interventions: 1 to 1 sessions, Unit Group sessions and Medication administration.  Evaluation of Outcomes: Progressing  Physician Treatment Plan for Secondary Diagnosis: Principal Problem:   MDD (major depressive disorder), recurrent severe, without psychosis (HCC) Active Problems:   Severe recurrent major depression without psychotic features (HCC)   Long Term Goal(s): Improvement in symptoms so as ready for discharge  Short Term Goals: Ability to verbalize feelings will improve Ability to disclose and discuss suicidal ideas Ability to demonstrate self-control will improve Ability to identify and develop effective coping behaviors will improve Ability to maintain clinical measurements within normal limits will improve Ability to verbalize feelings will improve Ability to disclose and discuss suicidal ideas Ability to demonstrate self-control will improve Ability to identify and develop effective coping behaviors will improve Ability to maintain clinical measurements within normal limits will improve  Medication Management: Evaluate patient's response, side effects, and tolerance of medication regimen.  Therapeutic Interventions: 1 to 1 sessions, Unit Group sessions and Medication administration.  Evaluation of Outcomes: Progressing   RN Treatment Plan for Primary Diagnosis: MDD (major depressive disorder), recurrent severe, without psychosis (HCC) Long Term Goal(s): Knowledge of disease and therapeutic regimen to maintain health will improve  Short Term Goals: Ability to verbalize feelings will improve and Ability to disclose and discuss suicidal ideas  Medication Management: RN will  administer medications as ordered by provider, will assess and evaluate patient's response and provide education to patient for prescribed medication. RN will report any adverse and/or side effects to prescribing provider.  Therapeutic Interventions: 1 on 1 counseling sessions, Psychoeducation, Medication administration, Evaluate responses to treatment, Monitor vital signs and CBGs as ordered, Perform/monitor CIWA, COWS, AIMS and Fall Risk screenings as ordered, Perform wound care treatments as ordered.  Evaluation of Outcomes: Progressing   LCSW Treatment Plan for Primary Diagnosis: MDD (major depressive disorder), recurrent severe, without psychosis (HCC) Long Term Goal(s): Safe transition to appropriate next level of care at discharge, Engage patient in therapeutic group addressing interpersonal concerns.  Short Term Goals: Engage patient in aftercare planning with referrals and resources, Identify triggers associated with mental health/substance abuse issues and Increase skills for wellness and recovery  Therapeutic Interventions: Assess for all discharge needs, 1 to 1 time with Social worker, Explore available resources and support systems, Assess for adequacy in community support network, Educate family and significant other(s) on suicide prevention, Complete Psychosocial Assessment, Interpersonal group therapy.  Evaluation of Outcomes: Progressing   Progress in Treatment: Attending groups: Yes Participating in groups: Yes Taking medication as prescribed: Yes, MD continues to assess for medication changes as needed Toleration medication: Yes, no side effects reported at this time Family/Significant other contact made: No, CSW attempted to make contact with mother Patient understands diagnosis: Developing insight Discussing patient identified problems/goals with staff: Yes Medical problems  stabilized or resolved: Yes Denies suicidal/homicidal ideation: No, Pt endorses passive  SI Issues/concerns per patient self-inventory: None Other: N/A  New problem(s) identified: None identified at this time.   New Short Term/Long Term Goal(s): None identified at this time.   Discharge Plan or Barriers: Pt will return home and follow-up with outpatient services at [facility] [place].  Reason for Continuation of Hospitalization: Anxiety Depression Medication stabilization Suicidal ideation  Estimated Length of Stay: 2-4 days  Attendees: Patient: 02/09/2017  8:53 AM  Physician: Dr. Jama Flavors 02/09/2017  8:53 AM  Nursing: Aggie Cosier, RN; [place], RN 02/09/2017  8:53 AM  RN Care Manager: Onnie Boer, RN 02/09/2017  8:53 AM  Social Worker: Donnelly Stager, Theresia Majors 02/09/2017  8:53 AM  Recreational Therapist:  02/09/2017  8:53 AM  Other: Armandina Stammer, NP; Gray Bernhardt, NP 02/09/2017  8:53 AM  Other:  02/09/2017  8:53 AM  Other: 02/09/2017  8:53 AM    Scribe for Treatment Team: Jonathon Jordan, MSW, LCSWA 02/09/2017 8:53 AM

## 2017-02-09 NOTE — Progress Notes (Signed)
D: Pt received in a calm pleasant mood. Cooperative with care. Took all meds without incident. Endorses passive SI with no plan. Pt's goal is to not self isolate in her room and come out to the day room. ? A: Praised pt for participating with groups. Safety checks maintained. ? R: Pt responded to praise well. She did complete her goals and socialized with peers. Verbalized no complaints.

## 2017-02-10 LAB — T3, FREE: T3, Free: 2.8 pg/mL (ref 2.0–4.4)

## 2017-02-10 NOTE — Progress Notes (Signed)
[facility] MD Progress Note  02/10/2017 2:32 PM Natasha [name]  MRN:  [id number]   Subjective: States she remains depressed, sad, but " feeling a little better " today. Reports decreasing ( passive ) thoughts of death, suicide. Denies any suicidal plan or intention at this time.  Tolerating medications well .  Objective: I have discussed case with treatment team and have met with patient. Although remains depressed and ruminative, presents less constricted, with a somewhat more reactive affect . She is also better groomed today, and as above, acknowledges she is feeling partially better . No disruptive or agitated behaviors on unit . Visible in day room , going to groups . Patient is future oriented, remains ruminative about her current social situation - namely mother becoming gradually more affected by Parkinson's Disease. States, however, that " we can still take care of each other, and I don't think it is time to go to an assisted living place yet ".  Labs - FT3, FT4 WNL.  Principal Problem: MDD (major depressive disorder), recurrent severe, without psychosis ([place]) Diagnosis:   Patient Active Problem List   Diagnosis Date Noted  . MDD (major depressive disorder), recurrent severe, without psychosis ([place]) [F33.2] 02/02/2017  . MDD (major depressive disorder) [F32.9] 10/02/2015  . Severe recurrent major depression without psychotic features ([place]) [F33.2] 06/19/2015   Total Time spent with patient: 20 minutes  Past Medical History:  Past Medical History:  Diagnosis Date  . C6 spinal cord injury ([place])   . Depression     Past Surgical History:  Procedure Laterality Date  . BACK SURGERY     Family History:  Family History  Problem Relation Age of Onset  . Alcohol abuse Father   . Alcohol abuse Brother   . Depression Mother   . Alcohol abuse Mother    Social History:  History  Alcohol Use No     History  Drug Use No    Social History   Social History  . Marital status: Single     Spouse name: N/A  . Number of children: N/A  . Years of education: N/A   Social History Main Topics  . Smoking status: Never Smoker  . Smokeless tobacco: Never Used     Comment: No smoking hx; no need for cessation materials  . Alcohol use No  . Drug use: No  . Sexual activity: No   Other Topics Concern  . None   Social History Narrative  . None   Additional Social History:    Pain Medications: pt denies abuse - see pta meds list Prescriptions: pt denies abuse - see pta meds list Over the Counter: pt denies abuse - see pta meds list History of alcohol / drug use?: No history of alcohol / drug abuse Longest period of sobriety (when/how long): n/a  Sleep: Good  Appetite:  Improved  Current Medications: Current Facility-Administered Medications  Medication Dose Route Frequency Provider Last Rate Last Dose  . acetaminophen (TYLENOL) tablet 650 mg  650 mg Oral Q6H PRN Ethelene Hal, NP   650 mg at 02/07/17 0950  . alum & mag hydroxide-simeth (MAALOX/MYLANTA) 200-200-20 MG/5ML suspension 30 mL  30 mL Oral Q4H PRN Ethelene Hal, NP      . ARIPiprazole (ABILIFY) tablet 5 mg  5 mg Oral Daily [name] Doyal Saric, MD   5 mg at 02/10/17 0800  . clonazePAM (KLONOPIN) tablet 2 mg  2 mg Oral QHS Ethelene Hal, NP   2 mg  at 02/09/17 2323  . cyclobenzaprine (FLEXERIL) tablet 10 mg  10 mg Oral QHS PRN [name], MD   10 mg at 02/04/17 0849  . escitalopram (LEXAPRO) tablet 10 mg  10 mg Oral Daily [name], MD   10 mg at 02/10/17 0801  . hydrOXYzine (ATARAX/VISTARIL) tablet 25 mg  25 mg Oral Q6H PRN Ethelene Hal, NP   25 mg at 02/06/17 1613  . lithium carbonate capsule 300 mg  300 mg Oral Daily [name], MD   300 mg at 02/10/17 0801  . lithium carbonate capsule 600 mg  600 mg Oral QHS [name], MD   600 mg at 02/09/17 2323  . magnesium hydroxide (MILK OF MAGNESIA) suspension 30 mL  30 mL Oral Daily PRN Ethelene Hal, NP         Lab Results:  Results for orders placed or performed during the hospital encounter of 02/02/17 (from the past 48 hour(s))  T3, free     Status: None   Collection Time: 02/09/17  6:38 AM  Result Value Ref Range   T3, Free 2.8 2.0 - 4.4 pg/mL    Comment: (NOTE) Performed At: [facility] [place], [place] [id number] Lindon Romp MD [id number] Performed at [facility], [place] [address] [place], [place] [address]   T4, free     Status: None   Collection Time: 02/09/17  6:38 AM  Result Value Ref Range   Free T4 0.83 0.61 - 1.12 ng/dL    Comment: (NOTE) Biotin ingestion may interfere with free T4 tests. If the results are inconsistent with the TSH level, previous test results, or the clinical presentation, then consider biotin interference. If needed, order repeat testing after stopping biotin. Performed at [facility], [place] [address] [place], [place] [address]     Blood Alcohol level:  Lab Results  Component Value Date   [facility] <5 08/12/2016   ETH <5 62/94/7654    Metabolic Disorder Labs: Lab Results  Component Value Date   HGBA1C 4.7 (L) 02/03/2017   MPG 88 02/03/2017   MPG 108 10/31/2015   Lab Results  Component Value Date   PROLACTIN 121.9 (H) 02/03/2017   Lab Results  Component Value Date   CHOL 214 (H) 02/03/2017   TRIG 117 02/03/2017   HDL 42 02/03/2017   CHOLHDL 5.1 02/03/2017   VLDL 23 02/03/2017   LDLCALC 149 (H) 02/03/2017    Physical Findings: AIMS: Facial and Oral Movements Muscles of Facial Expression: None, normal Lips and Perioral Area: None, normal Jaw: None, normal Tongue: None, normal,Extremity Movements Upper (arms, wrists, hands, fingers): None, normal Lower (legs, knees, ankles, toes): None, normal, Trunk Movements Neck, shoulders, hips: None, normal, Overall Severity Severity of abnormal movements (highest score from questions above): None, normal Incapacitation due to abnormal  movements: None, normal Patient's awareness of abnormal movements (rate only patient's report): No Awareness, Dental Status Current problems with teeth and/or dentures?: No Does patient usually wear dentures?: No  CIWA:    COWS:     Musculoskeletal: Strength & Muscle Tone: history of quadriparesia Gait & Station: currently mobilizing in wheel chair  Patient leans: N/A  Psychiatric Specialty Exam: Physical Exam  Nursing note and vitals reviewed. Psychiatric: She exhibits a depressed mood.    ROS no chest pain, no shortness of breath , no vomiting   Blood pressure 109/67, pulse 77, temperature 98.6 F (37 C), temperature source Oral, resp.  rate 16, height 5' 5"  (1.651 m), weight 62.1 kg (137 lb), last menstrual period 01/22/2017, SpO2 100 %.Body mass index is 22.8 kg/m.  General Appearance: improved grooming   Eye Contact:  Good  Speech:  Normal Rate  Volume:  Normal  Mood:  Still depressed, but feeling slightly better today  Affect:  less constricted   Thought Process:  Linear and Descriptions of Associations: Intact  Orientation:  Full (Time, Place, and Person)  Thought Content:   No hallucinations, no delusions   Suicidal Thoughts:  Reports chronic passive thoughts of death,dying, but states these are improving , and denies any SI at this time- denies any suicidal or self injurious ideations at this time and contracts for safety on unit   Homicidal Thoughts:  No denies any violent or homicidal ideations  Memory:  recent and remote grossly intact   Judgement:  Other:  improving   Insight:  Present  Psychomotor Activity:  mobilizes in wheel chair   Concentration:  Concentration: Good and Attention Span: Good  Recall:  Good  Fund of Knowledge:  Good  Language:  Good  Akathisia:  Negative  Handed:  Right  AIMS (if indicated):     Assets:  Communication Skills Desire for Improvement Resilience  ADL's:  Intact  Cognition:  WNL  Sleep:  Number of Hours: 5   Assessment -  reports some improvement in mood today, and affect presents less constricted, grooming is improved . Has history of chronic suicidal ideations, which she states are currently passive and decreasing in frequency, intensity. Denies any SI at this time. Tolerating Lexapro , Abilify ( new medication trial) , lithium well thus far .   Treatment Plan Summary: Daily contact with patient to assess and evaluate symptoms and progress in treatment, Medication management, Plan inpatient treatment and medications as below as of 02/10/17   Encourage group and milieu participation to work on coping skills and symptom reduction Continue Lexapro  10  mgrs QDAY for depression , anxiety.   Continue Abilify 5 mgrs QDAY as antidepressant augmentation  Continue Lithium 300 mgrs QAM and 600 mgrs QHS for mood disorder, antidepressant augmentation,suicidal ideations.   Continue Klonopin 2 mgrs QHS for anxiety, insomnia Treatment team working on disposition planning options   Neita Garnet, MD  02/10/2017, 2:32 PM   Patient ID: Dorris Singh, female   DOB: 03/23/1971, 46 y.o.   MRN: [id number]

## 2017-02-10 NOTE — Progress Notes (Signed)
[facility] Group Notes:  (Nursing/MHT/Case Management/Adjunct)  Date:  02/10/2017  Time:  10:07 PM  Type of Therapy:  Psychoeducational Skills  Participation Level:  Minimal  Participation Quality:  Drowsy  Affect:  Blunted  Cognitive:  Appropriate  Insight:  Appropriate  Engagement in Group:  Limited  Modes of Intervention:  Education  Summary of Progress/Problems: Patient states that she felt very drowsy throughout the day. Her goal for tomorrow is to try to be "more alert" and to attend more of the groups.   Lenton Gendreau S 02/10/2017, 10:07 PM

## 2017-02-10 NOTE — [facility] Group Notes (Signed)
[facility] [employer] Group Therapy 02/10/2017 1:15pm  Type of Therapy: Mental [employer] Presentation  Participation Level: Active  Participation Quality: Attentive  Affect: Appropriate  Cognitive: Oriented  Insight: Developing/Improving  Engagement in Therapy: Engaged  Modes of Intervention: Discussion, Education and Socialization  Summary of Progress/Problems: Mental [employer] Association (MHA) Speaker came to talk about his personal journey with substance abuse and addiction. The pt processed ways by which to relate to the speaker. MHA speaker provided handouts and educational information pertaining to groups and services offered by the MHA. Pt was engaged in speaker's presentation and was receptive to resources provided.    Danikah Budzik B. Ishmael Berkovich, MSW, LCSWA 02/10/2017 2:08 PM   

## 2017-02-10 NOTE — Progress Notes (Signed)
Patient ID: [name], female   DOB: 1971/06/27, 46 y.o.   MRN: [id number]  DAR: Pt. Denies HI and A/V Hallucinations. She reports passive SI but does not elaborate. She is able to contract for safety. She reports sleep is fair, appetite is fair, energy level is low, and concentration is poor. She rates depression 6/10, hopelessness 10/10, and anxiety 5/10. Patient does not report any pain or discomfort at this time. Support and encouragement provided to the patient. Scheduled medications administered to patient per physician's orders. Patient is minimal and forwards little to this Clinical research associatewriter. Patient is seen in the milieu minimally. She appears lethargic and drowsy today. Patient was seen in the dayroom sitting in her wheelchair with her eyes closed and resting twice today at different times. Q15 minute checks are maintained for safety.

## 2017-02-10 NOTE — Progress Notes (Signed)
Nursing Progress Note: 7p-7a D: Pt currently presents with a depressed/sad affect and behavior. Pt states "i've had thoughts of hurting myself, but I've had less thoughts today. I made some friends and that really helps." Interacting appropriately with milieu. Pt reports ok sleep with current medication regimen.   A: Pt provided with medications per providers orders. Pt's labs and vitals were monitored throughout the night. Pt supported emotionally and encouraged to express concerns and questions. Pt educated on medications.  R: Pt's safety ensured with 15 minute and environmental checks. Pt currently denies HI and AVH and endorses passive SI. Pt verbally contracts to seek staff if SI/HI or A/VH occurs and to consult with staff before acting on any harmful thoughts. Will continue to monitor.

## 2017-02-10 NOTE — Plan of Care (Signed)
Problem: Activity: Goal: Interest or engagement in leisure activities will improve Outcome: Not Progressing Patient will come into the milieu at times and attend some groups however does not appear to be interacting with many peers. She appears drowsy today.

## 2017-02-10 NOTE — Progress Notes (Signed)
Nursing Progress Note: 7p-7a D: Pt currently presents with a depressed/flat affect and behavior. Interacting minimally with milieu. Pt reports good sleep with current medication regimen.   A: Pt provided with medications per providers orders. Pt's labs and vitals were monitored throughout the night. Pt supported emotionally and encouraged to express concerns and questions. Pt educated on medications.  R: Pt's safety ensured with 15 minute and environmental checks. Pt currently denies HI and AVH and endorses passive SI. Pt verbally contracts to seek staff if SI/HI or A/VH occurs and to consult with staff before acting on any harmful thoughts. Will continue to monitor.

## 2017-02-10 NOTE — [facility] Group Notes (Addendum)
[facility] Group Notes: Leisure and Lifestyle Changes   Date:  02/10/2017  Time:  10:57 AM  Type of Therapy:  Psychoeducational Skills  Participation Level:  Minimal  Participation Quality:  Drowsy  Affect:  Blunted and Depressed  Cognitive:  Appropriate  Insight:  Limited  Engagement in Group:  Lacking  Modes of Intervention:  Discussion and Education  Summary of Progress/Problems: Patient attended group but was drowsy.   Marzetta BoardDopson, Sabastien Tyler E 02/10/2017, 10:57 AM

## 2017-02-10 NOTE — Progress Notes (Signed)
Patient ID: [name], female   DOB: [birth year], 46 y.o.   MRN: [id number] PER STATE REGULATIONS 482.30  THIS CHART WAS REVIEWED FOR MEDICAL NECESSITY WITH RESPECT TO THE PATIENT'S ADMISSION/ DURATION OF STAY.  NEXT REVIEW DATE:  02/14/2017 Willa RoughJENNIFER JONES Natasha Lemanski, RN, BSN CASE MANAGER

## 2017-02-11 NOTE — [facility] Group Notes (Signed)
[facility] LCSW Group Therapy 02/11/2017 1:15pm  Type of Therapy: Group Therapy- Feelings Around Relapse and Recovery  Participation Level: Active   Participation Quality:  Appropriate  Affect:  Appropriate  Cognitive: Alert and Oriented   Insight:  Developing   Engagement in Therapy: Developing/Improving and Engaged   Modes of Intervention: Clarification, Confrontation, Discussion, Education, Exploration, Limit-setting, Orientation, Problem-solving, Rapport Building, Dance movement psychotherapisteality Testing, Socialization and Support  Summary of Progress/Problems: The topic for today was feelings about relapse. The group discussed what relapse prevention is to them and identified triggers that they are on the path to relapse. Members also processed their feeling towards relapse and were able to relate to common experiences. Group also discussed coping skills that can be used for relapse prevention.  Pt was engaged and participated appropriately in group mindfulness activities.    Therapeutic Modalities:   Cognitive Behavioral Therapy Solution-Focused Therapy Assertiveness Training Relapse Prevention Therapy    Damien FusiLauren Kalik Hoare, LCSW [phone] 02/11/2017 2:47 PM

## 2017-02-11 NOTE — Progress Notes (Signed)
D: Pt was in the dayroom upon initial approach.  Pt presents with depressed affect and mood.  She reports her day was "okay" and her goal was to "just progress and today was the first day I stayed out of my room all day so that was good."  Pt reports passive SI without a plan.  Pt denies HI, denies hallucinations, denies pain.  Pt has been visible in milieu interacting with select peers and staff appropriately.  Pt ambulated down hallway and back with writer's assistance and assistance of cane.  Pt ambulated slowly, but required little physical support from Clinical research associatewriter.  She reports she is afraid she will fall and "it's like a mental block."  Pt attended evening group.    A: Introduced self to pt.  Actively listened to pt and offered support and encouragement. Medications administered per order.  Q15 minute safety checks maintained.  R: Pt is safe on the unit.  Pt is compliant with medications.  Pt verbally contracts for safety.  Will continue to monitor and assess.

## 2017-02-11 NOTE — Progress Notes (Signed)
Recreation Therapy Notes  Date: 02/11/17 Time: 0930 Location: 300 Hall Dayroom  Group Topic: Stress Management  Goal Area(s) Addresses:  Patient will verbalize importance of using healthy stress management.  Patient will identify positive emotions associated with healthy stress management.   Behavioral Response: Engaged  Intervention: Stress Management  Activity :  Meditation.  LRT introduced the stress management technique of meditation.  LRT played a meditation on resilience from the Calm app to allow the patients to engage in the technique.  Patients were to follow along as the meditation played to participate in the technique.   Education:  Stress Management, Discharge Planning.   Education Outcome: Acknowledges edcuation/In group clarification offered/Needs additional education  Clinical Observations/Feedback: Pt attended group.   Caroll RancherMarjette Limmie Schoenberg, LRT/CTRS         Caroll RancherLindsay, Daisia Slomski A 02/11/2017 12:18 PM

## 2017-02-11 NOTE — Progress Notes (Signed)
[facility] MD Progress Note  02/11/2017 3:25 PM Natasha [name]  MRN:  [id number]   Subjective: Patient reports that although still depressed, she feels she is making progress, and has a feeling that " medications are starting to work". Denies side effects.  Objective: I have discussed case with treatment team and have met with patient. Patient presents with partial improvement in mood and range of affect. Still depressed, but affect is more reactive, smiles at times appropriately and appears more future oriented. For example, today spoke about her horse, and how a friend of family rides it at equestrian shows. States she continues to enjoy horses , which she grew up with.  Also of note, today expressed interest in starting to walk more, rather than using her wheel chair all the time. States she realizes walking is better for her health, and walked up and down hallway earlier today with nursing assistance . Suicidal ideations have been chronic, intermittent, at this time are improving , and denies any current suicidal plan or intention. No disruptive or agitated behaviors on unit, going to groups.  Denies medication side effects . Principal Problem: MDD (major depressive disorder), recurrent severe, without psychosis ([place]) Diagnosis:   Patient Active Problem List   Diagnosis Date Noted  . MDD (major depressive disorder), recurrent severe, without psychosis ([place]) [F33.2] 02/02/2017  . MDD (major depressive disorder) [F32.9] 10/02/2015  . Severe recurrent major depression without psychotic features ([place]) [F33.2] 06/19/2015   Total Time spent with patient: 20 minutes  Past Medical History:  Past Medical History:  Diagnosis Date  . C6 spinal cord injury ([place])   . Depression     Past Surgical History:  Procedure Laterality Date  . BACK SURGERY     Family History:  Family History  Problem Relation Age of Onset  . Alcohol abuse Father   . Alcohol abuse Brother   . Depression Mother   . Alcohol  abuse Mother    Social History:  History  Alcohol Use No     History  Drug Use No    Social History   Social History  . Marital status: Single    Spouse name: N/A  . Number of children: N/A  . Years of education: N/A   Social History Main Topics  . Smoking status: Never Smoker  . Smokeless tobacco: Never Used     Comment: No smoking hx; no need for cessation materials  . Alcohol use No  . Drug use: No  . Sexual activity: No   Other Topics Concern  . None   Social History Narrative  . None   Additional Social History:    Pain Medications: pt denies abuse - see pta meds list Prescriptions: pt denies abuse - see pta meds list Over the Counter: pt denies abuse - see pta meds list History of alcohol / drug use?: No history of alcohol / drug abuse Longest period of sobriety (when/how long): n/a  Sleep: Fair  Appetite:  Good    Current Medications: Current Facility-Administered Medications  Medication Dose Route Frequency Provider Last Rate Last Dose  . acetaminophen (TYLENOL) tablet 650 mg  650 mg Oral Q6H PRN Ethelene Hal, NP   650 mg at 02/07/17 0950  . alum & mag hydroxide-simeth (MAALOX/MYLANTA) 200-200-20 MG/5ML suspension 30 mL  30 mL Oral Q4H PRN Ethelene Hal, NP      . ARIPiprazole (ABILIFY) tablet 5 mg  5 mg Oral Daily [name], MD   5 mg  at 02/11/17 0840  . clonazePAM (KLONOPIN) tablet 2 mg  2 mg Oral QHS Ethelene Hal, NP   2 mg at 02/10/17 2311  . cyclobenzaprine (FLEXERIL) tablet 10 mg  10 mg Oral QHS PRN [name], MD   10 mg at 02/04/17 0849  . escitalopram (LEXAPRO) tablet 10 mg  10 mg Oral Daily [name], MD   10 mg at 02/11/17 0840  . hydrOXYzine (ATARAX/VISTARIL) tablet 25 mg  25 mg Oral Q6H PRN Ethelene Hal, NP   25 mg at 02/06/17 1613  . lithium carbonate capsule 300 mg  300 mg Oral Daily [name], MD   300 mg at 02/11/17 0840  . lithium carbonate capsule 600 mg  600 mg Oral QHS  [name], MD   600 mg at 02/10/17 2312  . magnesium hydroxide (MILK OF MAGNESIA) suspension 30 mL  30 mL Oral Daily PRN Ethelene Hal, NP        Lab Results:  No results found for this or any previous visit (from the past 48 hour(s)).  Blood Alcohol level:  Lab Results  Component Value Date   [facility] <5 08/12/2016   ETH <5 82/64/1583    Metabolic Disorder Labs: Lab Results  Component Value Date   HGBA1C 4.7 (L) 02/03/2017   MPG 88 02/03/2017   MPG 108 10/31/2015   Lab Results  Component Value Date   PROLACTIN 121.9 (H) 02/03/2017   Lab Results  Component Value Date   CHOL 214 (H) 02/03/2017   TRIG 117 02/03/2017   HDL 42 02/03/2017   CHOLHDL 5.1 02/03/2017   VLDL 23 02/03/2017   LDLCALC 149 (H) 02/03/2017    Physical Findings: AIMS: Facial and Oral Movements Muscles of Facial Expression: None, normal Lips and Perioral Area: None, normal Jaw: None, normal Tongue: None, normal,Extremity Movements Upper (arms, wrists, hands, fingers): None, normal Lower (legs, knees, ankles, toes): None, normal, Trunk Movements Neck, shoulders, hips: None, normal, Overall Severity Severity of abnormal movements (highest score from questions above): None, normal Incapacitation due to abnormal movements: None, normal Patient's awareness of abnormal movements (rate only patient's report): No Awareness, Dental Status Current problems with teeth and/or dentures?: No Does patient usually wear dentures?: No  CIWA:    COWS:     Musculoskeletal: Strength & Muscle Tone: history of quadriparesia Gait & Station: currently mobilizing in wheel chair  Patient leans: N/A  Psychiatric Specialty Exam: Physical Exam  Nursing note and vitals reviewed. Psychiatric: She exhibits a depressed mood.    ROS no nausea, no vomiting   Blood pressure 93/61, pulse 67, temperature 97.7 F (36.5 C), temperature source Oral, resp. rate 16, height 5' 5"  (1.651 m), weight 62.1 kg (137 lb), last  menstrual period 01/22/2017, SpO2 100 %.Body mass index is 22.8 kg/m.  General Appearance: Well Groomed  Eye Contact:  Good  Speech:  Normal Rate  Volume:  Normal  Mood:  Improving gradually, less depressed   Affect:  still constricted, but more reactive   Thought Process:  Goal Directed and Descriptions of Associations: Intact  Orientation:  Full (Time, Place, and Person)  Thought Content:  No psychotic symptoms  Suicidal Thoughts:  Currently denies any suicidal plan or intention, contracts for safety on unit   Homicidal Thoughts:  denies -  denies any violent or homicidal ideations  Memory:  recent and remote grossly intact   Judgement:  Other:  improving   Insight:  improving   Psychomotor Activity:  more mobile and today has started walking with RN assistance   Concentration:  Concentration: Good and Attention Span: Good  Recall:  Good  Fund of Knowledge:  Good  Language:  Good  Akathisia:  Negative  Handed:  Right  AIMS (if indicated):     Assets:  Communication Skills Desire for Improvement Resilience  ADL's:  Intact  Cognition:  WNL  Sleep:  Number of Hours: 5   Assessment - patient is presenting with gradual improvement. She acknowledges feeling better, less depressed, and is becoming more future oriented. Today walked for a period of time with RN assistance ( normally mobilizes in wheel chair) , and seems less ruminative about stressors. Tolerating medications well thus far  Treatment Plan Summary: Daily contact with patient to assess and evaluate symptoms and progress in treatment, Medication management, Plan inpatient treatment and medications as below as of 02/11/17   Encourage group and milieu participation to work on coping skills and symptom reduction Continue Lexapro 10  mgrs QDAY for depression , anxiety.   Continue Abilify 5 mgrs QDAY as antidepressant augmentation  Continue Lithium 300 mgrs QAM and 600 mgrs QHS for mood disorder, antidepressant  augmentation,suicidal ideations.   Continue Klonopin 2 mgrs QHS for anxiety, insomnia Treatment team working on disposition planning options   Neita Garnet, MD  02/11/2017, 3:25 PM   Patient ID: Natasha [name], female   DOB: May 19, 1971, 46 y.o.   MRN: [id number]

## 2017-02-11 NOTE — Progress Notes (Signed)
[name] is seen OOB UAL ( using her wheelchair ) and she tolerates this fairly well. She remains flat, blunted and distant but says to this writer " I'm feeling  better". She completes her daily assessment and on it she wrote she has experienced SI today and she rates her depression, hopelessness and anxiety " 5/10/6", respectively. She contracts with this Clinical research associatewriter  to not hurt herself  She says to this Clinical research associatewriter " I want to walk today" and Clinical research associatewriter asissted pt to stand upright and  ambulate in the hall with writer as SBA, pt positioned right forearm into right  / cradle / arm rest  and pt ambulated quite slowly....but tolerated very well. She ambulated approx 50 feet, her steps were deliberate  . Pt stated over and over " I don't know why.I ''m afraid I will fall ". Writer offered her pos reinforcement afterwards and pt was positioned back in her wheelchair to watch TV and visit with her peers. R Safety is in place,

## 2017-02-11 NOTE — Plan of Care (Signed)
Problem: Self-Concept: Goal: Ability to disclose and discuss suicidal ideas will improve Outcome: Progressing Pt reports passive SI without a plan to Clinical research associatewriter.  She verbally contracts for safety.

## 2017-02-12 MED ORDER — ENSURE ENLIVE PO LIQD
237.0000 mL | Freq: Two times a day (BID) | ORAL | Status: DC
  Administered 2017-02-12 – 2017-02-16 (×7): 237 mL via ORAL

## 2017-02-12 NOTE — [facility] Group Notes (Signed)
Identifying Needs   Date:  02/12/2017  Time:  1400  Type of Therapy:  Nurse Education  /  The group is focused on teaching patients how to identify their needs and then how to develop skills needed to get them met.   Participation Level:  Active  Participation Quality:  Attentive  Affect:  Depressed  Cognitive:  Alert  Insight:  Improving  Engagement in Group:  Engaged  Modes of Intervention:  Education  Summary of Progress/Problems:  Lauralyn Primes 02/12/2017, 5:16 PM

## 2017-02-12 NOTE — Progress Notes (Signed)
Patient attended group and said that her day was a 4. Her coping skills for today was socializing.

## 2017-02-12 NOTE — Progress Notes (Signed)
[facility]  02/12/2017 2:46 PM Melven SartoriusJenifer K Ipock  MRN:  [id number]   Subjective: patient reports " I am okay , I guess, just feeling depressed.".  Objective: Melven SartoriusJenifer K Pasquarella is awake, alert and oriented *3. Seen resting in wheelchair. Patient reports attending group session. Reports passive thoughts of suicidal ideation. Patient is able to contract for safety.  denies homicidal ideation. Denies auditory or visual hallucination and does not appear to be responding to internal stimuli.  Patient reports she is medication compliant without mediation side effects. Report learning new coping skills. States her depression 9/10.  Support, encouragement and reassurance was provided.   Labs - FT3, FT4 WNL.  Principal Problem: MDD (major depressive disorder), recurrent severe, without psychosis ([facility]) Diagnosis:   Patient Active Problem List   Diagnosis Date Noted  . MDD (major depressive disorder), recurrent severe, without psychosis ([facility]) [F33.2] 02/02/2017  . MDD (major depressive disorder) [F32.9] 10/02/2015  . Severe recurrent major depression without psychotic features ([facility]) [F33.2] 06/19/2015   Total Time spent with patient: 20 minutes  Past Medical History:  Past Medical History:  Diagnosis Date  . C6 spinal cord injury ([facility])   . Depression     Past Surgical History:  Procedure Laterality Date  . BACK SURGERY     Family History:  Family History  Problem Relation Age of Onset  . Alcohol abuse Father   . Alcohol abuse Brother   . Depression Mother   . Alcohol abuse Mother    Social History:  History  Alcohol Use No     History  Drug Use No    Social History   Social History  . Marital status: Single    Spouse name: N/A  . Number of children: N/A  . Years of education: N/A   Social History Main Topics  . Smoking status: Never Smoker  . Smokeless tobacco: Never Used     Comment: No smoking hx; no need for cessation materials  . Alcohol use No  . Drug use: No   . Sexual activity: No   Other Topics Concern  . None   Social History Narrative  . None   Additional Social History:    Pain Medications: pt denies abuse - see pta meds list Prescriptions: pt denies abuse - see pta meds list Over the Counter: pt denies abuse - see pta meds list History of alcohol / drug use?: No history of alcohol / drug abuse Longest period of sobriety (when/how long): n/a  Sleep: Good  Appetite:  Improved  Current Medications: Current Facility-Administered Medications  Medication Dose Route Frequency Provider Last Rate Last Dose  . acetaminophen (TYLENOL) tablet 650 mg  650 mg Oral Q6H PRN Laveda AbbeLaurie Britton Parks, NP   650 mg at 02/07/17 0950  . alum & mag hydroxide-simeth (MAALOX/MYLANTA) 200-200-20 MG/5ML suspension 30 mL  30 mL Oral Q4H PRN Laveda AbbeLaurie Britton Parks, NP      . ARIPiprazole (ABILIFY) tablet 5 mg  5 mg Oral Daily Craige CottaFernando A Cobos, MD   5 mg at 02/12/17 0820  . clonazePAM (KLONOPIN) tablet 2 mg  2 mg Oral QHS Laveda AbbeLaurie Britton Parks, NP   2 mg at 02/11/17 2255  . cyclobenzaprine (FLEXERIL) tablet 10 mg  10 mg Oral QHS PRN Craige CottaFernando A Cobos, MD   10 mg at 02/04/17 0849  . escitalopram (LEXAPRO) tablet 10 mg  10 mg Oral Daily Craige CottaFernando A Cobos, MD   10 mg at 02/12/17 0820  . feeding supplement (ENSURE  ENLIVE) (ENSURE ENLIVE) liquid 237 mL  237 mL Oral BID BM Rockey Situ Cobos, MD   237 mL at 02/12/17 1000  . hydrOXYzine (ATARAX/VISTARIL) tablet 25 mg  25 mg Oral Q6H PRN Laveda Abbe, NP   25 mg at 02/06/17 1613  . lithium carbonate capsule 300 mg  300 mg Oral Daily Craige Cotta, MD   300 mg at 02/12/17 0820  . lithium carbonate capsule 600 mg  600 mg Oral QHS Craige Cotta, MD   600 mg at 02/11/17 2255  . magnesium hydroxide (MILK OF MAGNESIA) suspension 30 mL  30 mL Oral Daily PRN Laveda Abbe, NP        Lab Results:  No results found for this or any previous visit (from the past 48 hour(s)).  Blood Alcohol level:  Lab Results   Component Value Date   [facility] <5 08/12/2016   ETH <5 10/01/2015    Metabolic Disorder Labs: Lab Results  Component Value Date   HGBA1C 4.7 (L) 02/03/2017   MPG 88 02/03/2017   MPG 108 10/31/2015   Lab Results  Component Value Date   PROLACTIN 121.9 (H) 02/03/2017   Lab Results  Component Value Date   CHOL 214 (H) 02/03/2017   TRIG 117 02/03/2017   HDL 42 02/03/2017   CHOLHDL 5.1 02/03/2017   VLDL 23 02/03/2017   LDLCALC 149 (H) 02/03/2017    Physical Findings: AIMS: Facial and Oral Movements Muscles of Facial Expression: None, normal Lips and Perioral Area: None, normal Jaw: None, normal Tongue: None, normal,Extremity Movements Upper (arms, wrists, hands, fingers): None, normal Lower (legs, knees, ankles, toes): None, normal, Trunk Movements Neck, shoulders, hips: None, normal, Overall Severity Severity of abnormal movements (highest score from questions above): None, normal Incapacitation due to abnormal movements: None, normal Patient's awareness of abnormal movements (rate only patient's report): No Awareness, Dental Status Current problems with teeth and/or dentures?: No Does patient usually wear dentures?: No  CIWA:    COWS:     Musculoskeletal: Strength & Muscle Tone: history of quadriparesia Gait & Station: currently mobilizing in wheel chair  Patient leans: N/A  Psychiatric Specialty Exam: Physical Exam  Nursing note and vitals reviewed. Constitutional: She is oriented to person, place, and time. She appears well-developed.  Cardiovascular: Normal rate.   Neurological: She is alert and oriented to person, place, and time.  Psychiatric: She has a normal mood and affect. Her behavior is normal.    Review of Systems  Psychiatric/Behavioral: Positive for depression and suicidal ideas.   no chest pain, no shortness of breath , no vomiting   Blood pressure 101/75, pulse 76, temperature 97.7 F (36.5 C), temperature source Oral, resp. rate 16, height 5\' 5"   (1.651 m), weight 62.1 kg (137 lb), last menstrual period 01/22/2017, SpO2 100 %.Body mass index is 22.8 kg/m.  General Appearance: Casual  Eye Contact:  Good  Speech:  Normal Rate  Volume:  Normal  Mood:  Still depressed  Affect:  Blunt and Flat  Thought Process:  Linear and Descriptions of Associations: Intact  Orientation:  Full (Time, Place, and Person)  Thought Content:   No hallucinations, no delusions   Suicidal Thoughts:  continues to endose chronic passive thoughts of death,dying,  denies any suicidal or self injurious ideations at this time and contracts for safety on unit   Homicidal Thoughts:  No   Memory:  recent and remote grossly intact   Judgement:  Other:  improving   Insight:  Present  Psychomotor Activity:  mobilizes in wheel chair   Concentration:  Concentration: Good and Attention Span: Good  Recall:  Good  Fund of Knowledge:  Good  Language:  Good  Akathisia:  Negative  Handed:  Right  AIMS (if indicated):     Assets:  Communication Skills Desire for Improvement Resilience  ADL's:  Intact  Cognition:  WNL  Sleep:  Number of Hours: 6.25     I agree with current treatment plan on 02/12/2017, Patient seen face-to-face for psychiatric evaluation follow-up, chart reviewed and case discussed. Reviewed the information documented and agree with the treatment plan.  Treatment Plan Summary: Daily contact with patient to assess and evaluate symptoms and progress in treatment, Medication management, Plan inpatient treatment and medications as below as of 02/12/17    Encourage group and milieu participation to work on coping skills and symptom reduction Continue Lexapro  10  mgrs QDAY for depression , anxiety.   Continue Abilify 5 mgrs QDAY as antidepressant augmentation  Continue Lithium 300 mgrs QAM and 600 mgrs QHS for mood disorder, antidepressant augmentation,suicidal ideations.   Continue Klonopin 2 mgrs QHS for anxiety, insomnia Treatment team working on  disposition planning options   Oneta Rack, NP  02/12/2017, 2:46 PM

## 2017-02-12 NOTE — Progress Notes (Signed)
Pt reported she was unable to get out of bed this morning.  She was assisted to an upright sitting position.  Pt was provided with Gatorade and PO fluids encouraged.  Pt was tremulous at the time.  She drank a cup of Gatorade and a manual blood pressure was taken: 89/60 sitting; pulse 72; O2 sat 98%.  Pt reports she has not been eating much and she did not drink much fluids yesterday.  Fall prevention techniques reviewed with pt, pt verbalized understanding.  Staff assisted pt to restroom.  Pt reports she feels slightly better than she did when she awoke.  She is no longer tremulous.  She agrees to get assistance from staff prior to ambulating or moving from wheelchair.  She denies needs and concerns at this time.  Ensure ordered.  Will pass on to day shift RN.  Pt is safe on the unit.  Breakfast tray being brought back for pt.

## 2017-02-12 NOTE — Progress Notes (Signed)
Natasha [name] is seen sitting in the dayroom...she is pale, sitting up in her chair she looks sleepy eyed and  Her eyes close sluggishlly as this Clinical research associatewriter speaks with her. She is able to complete her daily assessment and on it she writes she deneis SI today and she rates her depression,  Hopelessness and anxeity " 4/10/5", respectively. She ambulates with this Clinical research associatewriter down the hall and back up the hall, using her right armed cane and moves both feet very slowly and sluggishly, never leaning on this Clinical research associatewriter, she says over and over and over " Im so afraid I'm going to fall". A She attends her Life SKills group, is attentive during the group conversation and she demonstates insight into her illness when she is able to identify unhealthy behaviors we practice when we are emotionally stuck. Writer  Cont to offer pos feedback, encouragement and support as pt engages in her recovery. R Safety is in place.

## 2017-02-12 NOTE — [facility] Group Notes (Signed)
Adult Group Therapy Note (Social Work)  Date:  02/12/2017  Time:  10:00-11:00AM  Group Topic/Focus:  Today's process group focused on the topic of Self Sabotage, what this is, and what methods of self-sabotage patients in the group have found themselves using.  Commonalities were then pointed out and Motivational Interviewing was utilized to explore possible benefits of choosing healthier coping skills.  Participation Level:  Active  Participation Quality:  Attentive and Sharing  Affect:  Flat  Cognitive:  Appropriate  Insight: Good  Engagement in Group:  Developing/Improving  Modes of Intervention:  Discussion and Motivational Interviewing  Additional Comments:  The patient expressed that before she had her "accident" she used to ride horses, and still is able to do some horseback riding.  She contributed to the discussion at times.  Ambrose MantleMareida Grossman-Orr, LCSW 02/12/2017, 1:00 PM

## 2017-02-13 LAB — COMPREHENSIVE METABOLIC PANEL
ALT: 16 U/L (ref 14–54)
AST: 20 U/L (ref 15–41)
Albumin: 3.6 g/dL (ref 3.5–5.0)
Alkaline Phosphatase: 55 U/L (ref 38–126)
Anion gap: 7 (ref 5–15)
BUN: 11 mg/dL (ref 6–20)
CO2: 23 mmol/L (ref 22–32)
Calcium: 9 mg/dL (ref 8.9–10.3)
Chloride: 106 mmol/L (ref 101–111)
Creatinine, Ser: 0.58 mg/dL (ref 0.44–1.00)
GFR calc Af Amer: >60 mL/min (ref >60)
GFR calc non Af Amer: >60 mL/min (ref >60)
Glucose, Bld: 78 mg/dL (ref 65–99)
Potassium: 3.6 mmol/L (ref 3.5–5.1)
Sodium: 136 mmol/L (ref 135–145)
Total Bilirubin: 0.5 mg/dL (ref 0.3–1.2)
Total Protein: 6.4 g/dL — ABNORMAL LOW (ref 6.5–8.1)

## 2017-02-13 LAB — LITHIUM LEVEL: Lithium Lvl: 0.88 mmol/L (ref 0.60–1.20)

## 2017-02-13 MED ORDER — SULFAMETHOXAZOLE-TRIMETHOPRIM 800-160 MG PO TABS
1.0000 | ORAL_TABLET | Freq: Two times a day (BID) | ORAL | Status: DC
  Administered 2017-02-13 – 2017-02-16 (×7): 1 via ORAL
  Filled 2017-02-13: qty 1
  Filled 2017-02-13: qty 4
  Filled 2017-02-13 (×8): qty 1

## 2017-02-13 NOTE — [facility] Group Notes (Signed)
Adult Therapy Group Note  Date:  02/13/2017  Time:  10:00-11:00AM  Group Topic/Focus: Fears and Healthy/Unhealthy Coping Skills  Building Self Esteem:   The Focus of this group was to discuss some of the prevalent fears that patients experience, and to list some unhealthy coping and healthy coping techniques to deal with each fear, as well as supports that could help in using healthy coping.  This included a variety of supports, and CSW emphasized professional supports such as therapist, support groups and psychiatrist.  Several specific scenarios were reviewed, with suggestions for how to go about actually implementing the healthier coping technique(s).  Additional Comments:  The patient expressed that she is fearful of being judged and of failure, and she would like to learn more coping skills involving socializing.  Suggestions to her included taking someone with her to social activities such as support groups, asking just one friend if she could come watch a TV show with her, and making a commitment to go to a grocery store at least twice in a week and ask a random person a non-threatening question such as "have you ever tried that bread?"  She was very cowed by this, became nervous and said she could not imagine doing this.  Even after we talked about what possible reactions she might receive, from people walking away to answering her to getting angry at her, she said she could not possibly do this.  She agreed, however, to consider trying.  Participation Level:  Active  Participation Quality:  Attentive  Affect:  Anxious, Depressed and Flat  Cognitive:  Appropriate  Insight: Improving  Engagement in Group:  Developing/Improving  Modes of Intervention:  Discussion, Exploration   Ambrose MantleMareida Grossman-Orr, LCSW 02/13/2017   12:35pm

## 2017-02-13 NOTE — [facility] Group Notes (Signed)
[facility] Group Notes:  Life Skills Group  Date:  02/13/2017  Time:  4:58 PM  Type of Therapy:  Psychoeducational Skills  Participation Level:  Minimal  Participation Quality:  Appropriate  Affect:  Sullen  Cognitive:  Appropriate  Insight:  Lacking and Limited  Engagement in Group:  Engaged  Modes of Intervention:  Discussion and Education  Summary of Progress/Problems: Patient attended group and was engaged.   Marzetta BoardDopson, Aarya Robinson E 02/13/2017, 4:58 PM

## 2017-02-13 NOTE — Progress Notes (Signed)
D: Patient seen on day room watching TV. Remains on wheelchair. States "My day was good". Patient denies SI/HI, AH/VH at this time. Patient denies pain and verbalizes no concern. No behavioral issues noted. Will continue to monitor patient. A: Staff offered support and encouragement as needed. Due med given as ordered. Routine safety checks maintained. Will continue to monitor patient.  R: Patient remains safe on unit.

## 2017-02-13 NOTE — Progress Notes (Signed)
D: Pt was in the dayroom upon initial approach.  Pt presents with depressed affect and mood.  She remains lethargic although this may be baseline for her; still has minor tremor.  Pt appears jaundiced and pale.  She continues to report decreased PO intake and decreased PO fluid intake despite encouragement from staff.  Pt denies HI, denies hallucinations, denies pain.  Pt reports SI without a plan.  Pt has been visible in milieu interacting with peers and staff appropriately.  She reports that "socializing" helps her cope.  Pt reports "my dog got put down."  She reports her mother told her this today.  Her goal is "to just progress."  Pt attended evening group.  She has been using her wheelchair tonight.   A:  Actively listened to pt and offered support and encouragement. Medications administered per order.  Q15 minute safety checks maintained.  Pt reports she drank a cup of juice tonight and did not eat snack.  She was provided with a cup of Gatorade.  PO intake encouraged.  Related concern to on-site provider of pt's decreased intake and potential lithium level elevation.  Lithium level and CMP ordered.  R: Pt is safe on the unit.  Pt is compliant with medications.  Pt verbally contracts for safety.  Will continue to monitor and assess.

## 2017-02-13 NOTE — Progress Notes (Signed)
[facility] MD Progress Note  02/13/2017 11:35 AM Natasha [name]  MRN:  [id number]   Subjective: patient reports " I am feeling the same as yesterday." patient reports chronic passive thoughts of death. (denies plan) reports multiple previous past attempts.  Objective: Natasha [name] is awake, alert and oriented *3. Seen resting in wheelchair, interacting with peers in the dayroom. Patient reports a decreased appetite. Reports she was recently started on Ensure.  Patient reports attending all group session.continues to report passive thoughts of suicidal ideation. Patient is pleasant and clam with a flat affect.  Patient denies homicidal ideation. Denies auditory or visual hallucination and does not appear to be responding to internal stimuli.  Patient reports she is medication compliant without mediation side effects. States her depression 8/10 today.  Support, encouragement and reassurance was provided.  Principal Problem: MDD (major depressive disorder), recurrent severe, without psychosis ([place]) Diagnosis:   Patient Active Problem List   Diagnosis Date Noted  . MDD (major depressive disorder), recurrent severe, without psychosis ([place]) [F33.2] 02/02/2017  . MDD (major depressive disorder) [F32.9] 10/02/2015  . Severe recurrent major depression without psychotic features ([place]) [F33.2] 06/19/2015   Total Time spent with patient: 20 minutes  Past Medical History:  Past Medical History:  Diagnosis Date  . C6 spinal cord injury ([place])   . Depression     Past Surgical History:  Procedure Laterality Date  . BACK SURGERY     Family History:  Family History  Problem Relation Age of Onset  . Alcohol abuse Father   . Alcohol abuse Brother   . Depression Mother   . Alcohol abuse Mother    Social History:  History  Alcohol Use No     History  Drug Use No    Social History   Social History  . Marital status: Single    Spouse name: N/A  . Number of children: N/A  . Years of education:  N/A   Social History Main Topics  . Smoking status: Never Smoker  . Smokeless tobacco: Never Used     Comment: No smoking hx; no need for cessation materials  . Alcohol use No  . Drug use: No  . Sexual activity: No   Other Topics Concern  . None   Social History Narrative  . None   Additional Social History:    Pain Medications: pt denies abuse - see pta meds list Prescriptions: pt denies abuse - see pta meds list Over the Counter: pt denies abuse - see pta meds list History of alcohol / drug use?: No history of alcohol / drug abuse Longest period of sobriety (when/how long): n/a  Sleep: Good  Appetite:  Improved  Current Medications: Current Facility-Administered Medications  Medication Dose Route Frequency Provider Last Rate Last Dose  . acetaminophen (TYLENOL) tablet 650 mg  650 mg Oral Q6H PRN Ethelene Hal, NP   650 mg at 02/13/17 1130  . alum & mag hydroxide-simeth (MAALOX/MYLANTA) 200-200-20 MG/5ML suspension 30 mL  30 mL Oral Q4H PRN Ethelene Hal, NP      . ARIPiprazole (ABILIFY) tablet 5 mg  5 mg Oral Daily [name], MD   5 mg at 02/13/17 3149  . clonazePAM (KLONOPIN) tablet 2 mg  2 mg Oral QHS Ethelene Hal, NP   2 mg at 02/12/17 2250  . cyclobenzaprine (FLEXERIL) tablet 10 mg  10 mg Oral QHS PRN [name], MD   10 mg at 02/04/17 0849  . escitalopram (  LEXAPRO) tablet 10 mg  10 mg Oral Daily [name], MD   10 mg at 02/13/17 8325  . feeding supplement (ENSURE ENLIVE) (ENSURE ENLIVE) liquid 237 mL  237 mL Oral BID BM [name] Cobos, MD   237 mL at 02/13/17 1000  . hydrOXYzine (ATARAX/VISTARIL) tablet 25 mg  25 mg Oral Q6H PRN Ethelene Hal, NP   25 mg at 02/06/17 1613  . lithium carbonate capsule 300 mg  300 mg Oral Daily [name], MD   300 mg at 02/13/17 4982  . lithium carbonate capsule 600 mg  600 mg Oral QHS [name] Cobos, MD   600 mg at 02/12/17 2250  . magnesium hydroxide (MILK OF MAGNESIA)  suspension 30 mL  30 mL Oral Daily PRN Ethelene Hal, NP        Lab Results:  Results for orders placed or performed during the hospital encounter of 02/02/17 (from the past 48 hour(s))  Lithium level     Status: None   Collection Time: 02/13/17  6:07 AM  Result Value Ref Range   Lithium Lvl 0.88 0.60 - 1.20 mmol/L    Comment: Performed at [facility], [place] [address], [place], [place] [address]  Comprehensive metabolic panel     Status: Abnormal   Collection Time: 02/13/17  6:07 AM  Result Value Ref Range   Sodium 136 135 - 145 mmol/L   Potassium 3.6 3.5 - 5.1 mmol/L   Chloride 106 101 - 111 mmol/L   CO2 23 22 - 32 mmol/L   Glucose, Bld 78 65 - 99 mg/dL   BUN 11 6 - 20 mg/dL   Creatinine, Ser 0.58 0.44 - 1.00 mg/dL   Calcium 9.0 8.9 - 10.3 mg/dL   Total Protein 6.4 (L) 6.5 - 8.1 g/dL   Albumin 3.6 3.5 - 5.0 g/dL   AST 20 15 - 41 U/L   ALT 16 14 - 54 U/L   Alkaline Phosphatase 55 38 - 126 U/L   Total Bilirubin 0.5 0.3 - 1.2 mg/dL   GFR calc non Af Amer >60 >60 mL/min   GFR calc Af Amer >60 >60 mL/min    Comment: (NOTE) The eGFR has been calculated using the CKD EPI equation. This calculation has not been validated in all clinical situations. eGFR's persistently <60 mL/min signify possible Chronic Kidney Disease.    Anion gap 7 5 - 15    Comment: Performed at [facility], [place] [address], [place], [place] [address]    Blood Alcohol level:  Lab Results  Component Value Date   [facility] <5 08/12/2016   ETH <5 76/80/8811    Metabolic Disorder Labs: Lab Results  Component Value Date   HGBA1C 4.7 (L) 02/03/2017   MPG 88 02/03/2017   MPG 108 10/31/2015   Lab Results  Component Value Date   PROLACTIN 121.9 (H) 02/03/2017   Lab Results  Component Value Date   CHOL 214 (H) 02/03/2017   TRIG 117 02/03/2017   HDL 42 02/03/2017   CHOLHDL 5.1 02/03/2017   VLDL 23 02/03/2017   LDLCALC 149 (H) 02/03/2017    Physical  Findings: AIMS: Facial and Oral Movements Muscles of Facial Expression: None, normal Lips and Perioral Area: None, normal Jaw: None, normal Tongue: None, normal,Extremity Movements Upper (arms, wrists, hands, fingers): None, normal Lower (legs, knees, ankles, toes): None, normal, Trunk Movements Neck, shoulders, hips: None, normal, Overall Severity Severity of abnormal movements (highest score from questions  above): None, normal Incapacitation due to abnormal movements: None, normal Patient's awareness of abnormal movements (rate only patient's report): No Awareness, Dental Status Current problems with teeth and/or dentures?: No Does patient usually wear dentures?: No  CIWA:    COWS:     Musculoskeletal: Strength & Muscle Tone: history of quadriparesia Gait & Station: currently mobilizing in wheel chair  Patient leans: N/A  Psychiatric Specialty Exam: Physical Exam  Nursing note and vitals reviewed. Constitutional: She is oriented to person, place, and time. She appears well-developed.  Cardiovascular: Normal rate.   Neurological: She is alert and oriented to person, place, and time.  Skin: Skin is warm.  Psychiatric: She has a normal mood and affect. Her behavior is normal.    Review of Systems  Psychiatric/Behavioral: Positive for depression and suicidal ideas.   no chest pain, no shortness of breath , no vomiting   Blood pressure 98/67, pulse 65, temperature 98.4 F (36.9 C), temperature source Oral, resp. rate 18, height 5' 5"  (1.651 m), weight 62.1 kg (137 lb), last menstrual period 01/22/2017, SpO2 100 %.Body mass index is 22.8 kg/m.  General Appearance: Casual and Fairly Groomed  Eye Contact:  Good  Speech:  Normal Rate  Volume:  Decreased  Mood:  Still depressed  Affect:  Blunt and Flat  Thought Process:  Linear and Descriptions of Associations: Intact  Orientation:  Full (Time, Place, and Person)  Thought Content:   No hallucinations, no delusions   Suicidal  Thoughts:  continues to endorse chronic passive thoughts of death,dying- denies any suicidal or self injurious ideations at this time and contracts for safety on unit   Homicidal Thoughts:  No   Memory:  Immediate;   Fair Recent;   Fair Remote;   Fair  Judgement:  Other:  improving   Insight:  Present  Psychomotor Activity:  mobilizes in wheel chair   Concentration:  Concentration: Good and Attention Span: Good  Recall:  Good  Fund of Knowledge:  Good  Language:  Good  Akathisia:  Negative  Handed:  Right  AIMS (if indicated):     Assets:  Communication Skills Desire for Improvement Resilience  ADL's:  Intact  Cognition:  WNL  Sleep:  Number of Hours: 5.5     I agree with current treatment plan on 02/13/2017, Patient seen face-to-face for psychiatric evaluation follow-up, chart reviewed and case discussed. Reviewed the information documented and agree with the treatment plan.  Treatment Plan Summary: Daily contact with patient to assess and evaluate symptoms and progress in treatment, Medication management, Plan inpatient treatment and medications as below as of 02/13/17    Started Bactrim DS  800-160 mg for bacteria noted in urin. Patient is asymptomatic.   Encourage group and milieu participation to work on coping skills and symptom reduction Continue Lexapro  10  mgrs QDAY for depression , anxiety.   Continue Abilify 5 mgrs QDAY as antidepressant augmentation  Continue Lithium 300 mgrs QAM and 600 mgrs QHS for mood disorder, antidepressant augmentation,suicidal ideations.   Continue Klonopin 2 mgrs QHS for anxiety, insomnia Treatment team working on disposition planning options   Derrill Center, NP  02/13/2017, 11:35 AM

## 2017-02-13 NOTE — Progress Notes (Addendum)
[name] cont to be flat, depressed and quite sad. She remains confined to her wheelchair, she is quite pale, and is seen sitting in the dayroom, wit her head rolling to the side as she fell asleep sitting up..A : She completed her daily assessment this morning and on it she wrote  She denied SI today and she rated her depression, hopelessness and anxeity  ' 4/9/4", respectively. She was assisted to ambulate up and down the 400 hall twice during the day, requiring no physical assistance but wanting stand by assistance.She utilized cane in right arm and tolerated well. Unsteady on her feet and encouraged to cont to ambulate. Drank 2 Ensures and encouraged to increase po fluid intake.  R Safety is in place.

## 2017-02-13 NOTE — Progress Notes (Signed)
Patient attended wrap-up group and said that her day was a 7. She was excited because she was able to exercise today by walking up and down the hall twice.

## 2017-02-14 ENCOUNTER — Ambulatory Visit (HOSPITAL_COMMUNITY): Payer: Self-pay | Admitting: Clinical

## 2017-02-14 MED ORDER — ARIPIPRAZOLE 5 MG PO TABS
5.0000 mg | ORAL_TABLET | Freq: Every day | ORAL | Status: DC
  Administered 2017-02-15: 5 mg via ORAL
  Filled 2017-02-14 (×3): qty 1

## 2017-02-14 MED ORDER — CLONAZEPAM 1 MG PO TABS
1.5000 mg | ORAL_TABLET | Freq: Every day | ORAL | Status: DC
  Administered 2017-02-14 – 2017-02-15 (×2): 1.5 mg via ORAL
  Filled 2017-02-14 (×2): qty 1

## 2017-02-14 MED ORDER — CYCLOBENZAPRINE HCL 10 MG PO TABS: 5.0000 mg | ORAL_TABLET | Freq: Every evening | ORAL | Status: DC | PRN

## 2017-02-14 NOTE — Progress Notes (Signed)
At approximately 2153, a patient in the dayroom came to the nurse's station stating that patient had fallen to the floor.  When staff went to the dayroom, pt was sitting in the floor in front of her wheelchair, wedged between the foot rests.  Pt stated that she was trying to get into her wheelchair to go to the bathroom when her feet slipped out from under her.  She denies hitting any part of her body, although her L flank has some slight redness from where she was wedged by the foot rests.  She states it barely hurts, maybe a 2/10.  Pt declined having family called.  Vital signs were taken which were WNL.  Pt was helped up into her wheelchair.  She was re-educated on asking staff for assistance.  She was also told that she needed to wear the yellow socks with the gripper tabs on the bottom.  Pt was wearing bedroom slippers from home that did not have the grippers.  Pt was assessed by the PA on the unit.  Pt was reminded for this evening to ask for assistance when getting out of her chair and bed.  Pt was told that vital signs would be checked again at midnight.  Pt voiced understanding.

## 2017-02-14 NOTE — Progress Notes (Signed)
   02/14/17 2215  What Happened  Was fall witnessed? Yes (Redness to L flank-reports pain 2/10)  Who witnessed fall? other patients on the hall  Patients activity before fall to/from bed, chair, or stretcher (from unit chair to wheelchair)  Point of contact buttocks  Was patient injured? No  Follow Up  MD notified yes  Time MD notified 2200  Family notified No- patient refusal (Pt declined)  Time family notified (n/a)  Additional tests No  Simple treatment Other (comment) (declined)  Progress note created (see row info) Yes  Adult Fall Risk Assessment  Risk Factor Category (scoring not indicated) High fall risk per protocol (document High fall risk)  Patient's Fall Risk High Fall Risk (>13 points)

## 2017-02-14 NOTE — Progress Notes (Signed)
[facility] Group Notes:  (Nursing/MHT/Case Management/Adjunct)  Date:  02/14/2017  Time:  10:30 PM  Type of Therapy:  Psychoeducational Skills  Participation Level:  Active  Participation Quality:  Appropriate  Affect:  Appropriate  Cognitive:  Appropriate  Insight:  Appropriate  Engagement in Group:  Engaged  Modes of Intervention:  Education  Summary of Progress/Problems:The patient states that she had a pretty good day overall and that she enjoyed going outside. She anticipates being discharged on Wednesday. As for the theme of the day, her support system will consist of her doctor and therapist.   Natasha [name], Natasha [name] 02/14/2017, 10:30 PM

## 2017-02-14 NOTE — Progress Notes (Signed)
D: Natasha [name] reported some SI without a plan this morning, but she contracted for safety. She rated her depression and feelings of hopelessness high today on her self inventory. She rated her depression a 4/10. She reported good sleep, poor appetite, low energy level, and poor concentration. Her goal is to stay up and out of her room. She has been present in the dayroom.   A: Meds given as ordered. Q15 safety checks maintained. Support/encouragement offered.  R: Pt remains free from harm and continues with treatment. Will continue to monitor for needs/safety.

## 2017-02-14 NOTE — Tx Team (Signed)
Interdisciplinary Treatment and Diagnostic Plan Update  02/14/2017 Time of Session: 9:30am ALFRIEDA TARRY MRN: [id number]  Principal Diagnosis: MDD (major depressive disorder), recurrent severe, without psychosis ([facility])  Secondary Diagnoses: Principal Problem:   MDD (major depressive disorder), recurrent severe, without psychosis ([facility]) Active Problems:   Severe recurrent major depression without psychotic features ([facility])   Current Medications:  Current Facility-Administered Medications  Medication Dose Route Frequency Provider Last Rate Last Dose  . acetaminophen (TYLENOL) tablet 650 mg  650 mg Oral Q6H PRN Laveda Abbe, NP   650 mg at 02/13/17 1130  . alum & mag hydroxide-simeth (MAALOX/MYLANTA) 200-200-20 MG/5ML suspension 30 mL  30 mL Oral Q4H PRN Laveda Abbe, NP      . Melene Muller ON 02/15/2017] ARIPiprazole (ABILIFY) tablet 5 mg  5 mg Oral QHS Rockey Situ Cobos, MD      . clonazePAM (KLONOPIN) tablet 1.5 mg  1.5 mg Oral QHS Rockey Situ Cobos, MD      . cyclobenzaprine (FLEXERIL) tablet 5 mg  5 mg Oral QHS PRN Craige Cotta, MD      . escitalopram (LEXAPRO) tablet 10 mg  10 mg Oral Daily Craige Cotta, MD   10 mg at 02/14/17 4098  . feeding supplement (ENSURE ENLIVE) (ENSURE ENLIVE) liquid 237 mL  237 mL Oral BID BM Rockey Situ Cobos, MD   237 mL at 02/14/17 0824  . hydrOXYzine (ATARAX/VISTARIL) tablet 25 mg  25 mg Oral Q6H PRN Laveda Abbe, NP   25 mg at 02/06/17 1613  . lithium carbonate capsule 300 mg  300 mg Oral Daily Craige Cotta, MD   300 mg at 02/14/17 1191  . lithium carbonate capsule 600 mg  600 mg Oral QHS Rockey Situ Cobos, MD   600 mg at 02/13/17 2354  . magnesium hydroxide (MILK OF MAGNESIA) suspension 30 mL  30 mL Oral Daily PRN Laveda Abbe, NP      . sulfamethoxazole-trimethoprim (BACTRIM DS,SEPTRA DS) 800-160 MG per tablet 1 tablet  1 tablet Oral Q12H Oneta Rack, NP   1 tablet at 02/14/17 4782    PTA Medications: Prescriptions  Prior to Admission  Medication Sig Dispense Refill Last Dose  . Cholecalciferol (VITAMIN D-3 PO) Take 1 tablet by mouth every morning.   Past Week  . clonazePAM (KLONOPIN) 2 MG tablet Take 1 tablet (2 mg total) by mouth at bedtime. 15 tablet 0 02/01/2017  . FLUoxetine (PROZAC) 20 MG capsule Take 3 capsules (60 mg total) by mouth daily. 90 capsule 2 02/02/2017  . ibuprofen (ADVIL,MOTRIN) 200 MG tablet Take 800 mg by mouth every 6 (six) hours as needed for moderate pain.    02/01/2017  . lithium carbonate 300 MG capsule Take 1 in am and 2 at bed time (Patient taking differently: Take 300-600 mg by mouth 2 (two) times daily. Take one capsule in the morning and two at bedtime.) 90 capsule 2 02/02/2017  . OLANZapine (ZYPREXA) 10 MG tablet TAKE 1 TABLET(10 MG) BY MOUTH AT BEDTIME (Patient taking differently: Take 10 mg by mouth at bedtime. ) 30 tablet 0 02/01/2017  . oxymetazoline (AFRIN) 0.05 % nasal spray Place 1 spray into both nostrils 2 (two) times daily as needed for congestion.   unknown  . SUMAtriptan (IMITREX) 25 MG tablet Take 1 tablet (25 mg total) by mouth every 2 (two) hours as needed for migraine or headache. May repeat in 2 hours if headache persists or recurs. 10 tablet 0 unknown  Treatment Modalities: Medication Management, Group therapy, Case management,  1 to 1 session with clinician, Psychoeducation, Recreational therapy.  Patient Stressors: Loss of Pt's pet dog passed away recently  Patient Strengths: Ability for insight Average or above average intelligence Communication skills General fund of knowledge  Physician Treatment Plan for Primary Diagnosis: MDD (major depressive disorder), recurrent severe, without psychosis (HCC) Long Term Goal(s): Improvement in symptoms so as ready for discharge  Short Term Goals: Ability to verbalize feelings will improve Ability to disclose and discuss suicidal ideas Ability to demonstrate self-control will improve Ability to identify and  develop effective coping behaviors will improve Ability to maintain clinical measurements within normal limits will improve Ability to verbalize feelings will improve Ability to disclose and discuss suicidal ideas Ability to demonstrate self-control will improve Ability to identify and develop effective coping behaviors will improve Ability to maintain clinical measurements within normal limits will improve  Medication Management: Evaluate patient's response, side effects, and tolerance of medication regimen.  Therapeutic Interventions: 1 to 1 sessions, Unit Group sessions and Medication administration.  Evaluation of Outcomes: Progressing  Physician Treatment Plan for Secondary Diagnosis: Principal Problem:   MDD (major depressive disorder), recurrent severe, without psychosis (HCC) Active Problems:   Severe recurrent major depression without psychotic features (HCC)   Long Term Goal(s): Improvement in symptoms so as ready for discharge  Short Term Goals: Ability to verbalize feelings will improve Ability to disclose and discuss suicidal ideas Ability to demonstrate self-control will improve Ability to identify and develop effective coping behaviors will improve Ability to maintain clinical measurements within normal limits will improve Ability to verbalize feelings will improve Ability to disclose and discuss suicidal ideas Ability to demonstrate self-control will improve Ability to identify and develop effective coping behaviors will improve Ability to maintain clinical measurements within normal limits will improve  Medication Management: Evaluate patient's response, side effects, and tolerance of medication regimen.  Therapeutic Interventions: 1 to 1 sessions, Unit Group sessions and Medication administration.  Evaluation of Outcomes: Progressing   RN Treatment Plan for Primary Diagnosis: MDD (major depressive disorder), recurrent severe, without psychosis (HCC) Long  Term Goal(s): Knowledge of disease and therapeutic regimen to maintain health will improve  Short Term Goals: Ability to verbalize feelings will improve and Ability to disclose and discuss suicidal ideas  Medication Management: RN will administer medications as ordered by provider, will assess and evaluate patient's response and provide education to patient for prescribed medication. RN will report any adverse and/or side effects to prescribing provider.  Therapeutic Interventions: 1 on 1 counseling sessions, Psychoeducation, Medication administration, Evaluate responses to treatment, Monitor vital signs and CBGs as ordered, Perform/monitor CIWA, COWS, AIMS and Fall Risk screenings as ordered, Perform wound care treatments as ordered.  Evaluation of Outcomes: Progressing   LCSW Treatment Plan for Primary Diagnosis: MDD (major depressive disorder), recurrent severe, without psychosis (HCC) Long Term Goal(s): Safe transition to appropriate next level of care at discharge, Engage patient in therapeutic group addressing interpersonal concerns.  Short Term Goals: Engage patient in aftercare planning with referrals and resources, Identify triggers associated with mental health/substance abuse issues and Increase skills for wellness and recovery  Therapeutic Interventions: Assess for all discharge needs, 1 to 1 time with Social worker, Explore available resources and support systems, Assess for adequacy in community support network, Educate family and significant other(s) on suicide prevention, Complete Psychosocial Assessment, Interpersonal group therapy.  Evaluation of Outcomes: Progressing   Progress in Treatment: Attending groups: Yes Participating in groups: Yes Taking  medication as prescribed: Yes, MD continues to assess for medication changes as needed Toleration medication: Yes, no side effects reported at this time Family/Significant other contact made: No, CSW attempted to make contact  with mother Patient understands diagnosis: Developing insight Discussing patient identified problems/goals with staff: Yes Medical problems stabilized or resolved: Yes Denies suicidal/homicidal ideation: Yes Issues/concerns per patient self-inventory: None Other: N/A  New problem(s) identified: None identified at this time.   New Short Term/Long Term Goal(s): None identified at this time.   Discharge Plan or Barriers: Pt will return home and follow-up with outpatient services at [facility].  Reason for Continuation of Hospitalization: Anxiety Depression Medication stabilization Suicidal ideation  Estimated Length of Stay: 1-2 days  Attendees: Patient: 02/14/2017  4:46 PM  Physician: Dr. Jama Flavors 02/14/2017  4:46 PM  Nursing: Clydie Braun RN; [place], RN 02/14/2017  4:46 PM  RN Care Manager: Onnie Boer, RN 02/14/2017  4:46 PM  Social Worker: Donnelly Stager, LCSWA 02/14/2017  4:46 PM  Recreational Therapist:  02/14/2017  4:46 PM  Other: Armandina Stammer, NP; Gray Bernhardt, NP 02/14/2017  4:46 PM  Other:  02/14/2017  4:46 PM  Other: 02/14/2017  4:46 PM    Scribe for Treatment Team: Jonathon Jordan, MSW, LCSWA 02/14/2017 4:46 PM

## 2017-02-14 NOTE — Progress Notes (Signed)
Patient ID: Natasha SartoriusJenifer K [name], female   DOB: 1971/10/11, 46 y.o.   MRN: [id number] PER STATE REGULATIONS 482.30  THIS CHART WAS REVIEWED FOR MEDICAL NECESSITY WITH RESPECT TO THE PATIENT'S ADMISSION/ DURATION OF STAY.  NEXT REVIEW DATE: 02/18/2017  Willa RoughJENNIFER JONES [name], RN, BSN CASE MANAGER

## 2017-02-14 NOTE — Progress Notes (Signed)
Recreation Therapy Notes  Date: 02/14/17 Time: 0930 Location: [address] Dayroom  Group Topic: Stress Management  Goal Area(s) Addresses:  Patient will verbalize importance of using healthy stress management.  Patient will identify positive emotions associated with healthy stress management.   Intervention: Stress Management  Activity :  Guided Visualization.  LRT introduced the stress management technique of guided visualization.  LRT read a script to allow patient to follow along and engage in the activity.  Patients were to follow along at LRT read script to engage in the activity.  Education:  Stress Management, Discharge Planning.   Education Outcome: Acknowledges edcuation/In group clarification offered/Needs additional education  Clinical Observations/Feedback: Pt did not attend group.    Keshan Reha, LRT/CTRS         Yesenia Locurto A 02/14/2017 12:52 PM 

## 2017-02-14 NOTE — [facility] Group Notes (Signed)
[facility] LCSW Group Therapy  02/14/2017 1:15pm  Type of Therapy: Group Therapy   Topic: Overcoming Obstacles  Participation Level: Active  Participation Quality: Appropriate   Affect: Appropriate  Cognitive: Appropriate and Oriented  Insight: Developing/Improving and Improving  Engagement in Therapy: Improving  Modes of Intervention: Discussion, Exploration, Problem-solving and Support  Description of Group:  In this group patients will be encouraged to explore what they see as obstacles to their own wellness and recovery. They will be guided to discuss their thoughts, feelings, and behaviors related to these obstacles. The group will process together ways to cope with barriers, with attention given to specific choices patients can make. Each patient will be challenged to identify changes they are motivated to make in order to overcome their obstacles. This group will be process-oriented, with patients participating in exploration of their own experiences as well as giving and receiving support and challenge from other group members.  Summary of Patient Progress:  Pt identified her mother's medical condition as the main obstacle that she is currently facing. Pt lives with her mother and states that as her mother's health declines her mother would have to move into Assisted Living. Pt is concerned about this because her mother is her main support and she does not want to be separated from her. Pt also mentioned that her dog died yesterday and this has made going home seem like a daunting task to her.  Therapeutic Modalities:  Cognitive Behavioral Therapy Solution Focused Therapy Motivational Interviewing Relapse Prevention Therapy  Jonathon JordanLynn B Adaora [name], MSW, LCSWA 3:11 PM 02/14/17

## 2017-02-14 NOTE — Progress Notes (Signed)
[facility] MD Progress Note  02/14/2017 2:18 PM Natasha [name]  MRN:  [id number]   Subjective: reports partial improvement compared to admission . Reports a sense of anxiety, apprehension regarding discharge, but states " I am feeling better", and notes an increased readiness to work on disposition planning . Worries about her mother's health. As noted, has endorsed chronic suicidal ideations, but states that at this time has not plan or intention and identifies her sense of responsibility to her mother as a protective factor. Denies medication side effects, except for some sedation .  Objective:  Have reviewed case with treatment team and have met with patient. Patient is presenting with partially improved mood, although improvement is partial and endorses chronic depression . She reports chronic passive SI, but at present denies any plan or intention of hurting self or of SI. Anxious about discharging, but able to actively discuss and participate in discharge planning. States she plans to continue seeing Dr. Adele Schilder for outpatient treatment, and also plans to continue maintenance ECT , which she gets in [place]. No disruptive or agitated behaviors on unit. Going to some groups. Of note, has been noted to be more sedated, at times nodding off during the day, patient aware and states she feels this is most likely due to Abilify.  Labs reviewed - Li level therapeutic -0.88   Principal Problem: MDD (major depressive disorder), recurrent severe, without psychosis ([place]) Diagnosis:   Patient Active Problem List   Diagnosis Date Noted  . MDD (major depressive disorder), recurrent severe, without psychosis ([place]) [F33.2] 02/02/2017  . MDD (major depressive disorder) [F32.9] 10/02/2015  . Severe recurrent major depression without psychotic features ([place]) [F33.2] 06/19/2015   Total Time spent with patient: 20 minutes  Past Medical History:  Past Medical History:  Diagnosis Date  . C6 spinal cord injury ([place])    . Depression     Past Surgical History:  Procedure Laterality Date  . BACK SURGERY     Family History:  Family History  Problem Relation Age of Onset  . Alcohol abuse Father   . Alcohol abuse Brother   . Depression Mother   . Alcohol abuse Mother    Social History:  History  Alcohol Use No     History  Drug Use No    Social History   Social History  . Marital status: Single    Spouse name: N/A  . Number of children: N/A  . Years of education: N/A   Social History Main Topics  . Smoking status: Never Smoker  . Smokeless tobacco: Never Used     Comment: No smoking hx; no need for cessation materials  . Alcohol use No  . Drug use: No  . Sexual activity: No   Other Topics Concern  . None   Social History Narrative  . None   Additional Social History:    Pain Medications: pt denies abuse - see pta meds list Prescriptions: pt denies abuse - see pta meds list Over the Counter: pt denies abuse - see pta meds list History of alcohol / drug use?: No history of alcohol / drug abuse Longest period of sobriety (when/how long): n/a  Sleep: Good  Appetite:  Improving   Current Medications: Current Facility-Administered Medications  Medication Dose Route Frequency Provider Last Rate Last Dose  . acetaminophen (TYLENOL) tablet 650 mg  650 mg Oral Q6H PRN Ethelene Hal, NP   650 mg at 02/13/17 1130  . alum & mag hydroxide-simeth (MAALOX/MYLANTA) 200-200-20  MG/5ML suspension 30 mL  30 mL Oral Q4H PRN Ethelene Hal, NP      . Derrill Memo ON 02/15/2017] ARIPiprazole (ABILIFY) tablet 5 mg  5 mg Oral QHS [name] Cobos, MD      . clonazePAM (KLONOPIN) tablet 1.5 mg  1.5 mg Oral QHS [name] Cobos, MD      . cyclobenzaprine (FLEXERIL) tablet 10 mg  10 mg Oral QHS PRN [name], MD   10 mg at 02/04/17 0849  . escitalopram (LEXAPRO) tablet 10 mg  10 mg Oral Daily [name], MD   10 mg at 02/14/17 8270  . feeding supplement (ENSURE ENLIVE) (ENSURE  ENLIVE) liquid 237 mL  237 mL Oral BID BM [name] Cobos, MD   237 mL at 02/14/17 0824  . hydrOXYzine (ATARAX/VISTARIL) tablet 25 mg  25 mg Oral Q6H PRN Ethelene Hal, NP   25 mg at 02/06/17 1613  . lithium carbonate capsule 300 mg  300 mg Oral Daily [name], MD   300 mg at 02/14/17 7867  . lithium carbonate capsule 600 mg  600 mg Oral QHS [name] Cobos, MD   600 mg at 02/13/17 2354  . magnesium hydroxide (MILK OF MAGNESIA) suspension 30 mL  30 mL Oral Daily PRN Ethelene Hal, NP      . sulfamethoxazole-trimethoprim (BACTRIM DS,SEPTRA DS) 800-160 MG per tablet 1 tablet  1 tablet Oral Q12H Derrill Center, NP   1 tablet at 02/14/17 5449    Lab Results:  Results for orders placed or performed during the hospital encounter of 02/02/17 (from the past 48 hour(s))  Lithium level     Status: None   Collection Time: 02/13/17  6:07 AM  Result Value Ref Range   Lithium Lvl 0.88 0.60 - 1.20 mmol/L    Comment: Performed at [facility], [place] [address], [place], [place] [address]  Comprehensive metabolic panel     Status: Abnormal   Collection Time: 02/13/17  6:07 AM  Result Value Ref Range   Sodium 136 135 - 145 mmol/L   Potassium 3.6 3.5 - 5.1 mmol/L   Chloride 106 101 - 111 mmol/L   CO2 23 22 - 32 mmol/L   Glucose, Bld 78 65 - 99 mg/dL   BUN 11 6 - 20 mg/dL   Creatinine, Ser 0.58 0.44 - 1.00 mg/dL   Calcium 9.0 8.9 - 10.3 mg/dL   Total Protein 6.4 (L) 6.5 - 8.1 g/dL   Albumin 3.6 3.5 - 5.0 g/dL   AST 20 15 - 41 U/L   ALT 16 14 - 54 U/L   Alkaline Phosphatase 55 38 - 126 U/L   Total Bilirubin 0.5 0.3 - 1.2 mg/dL   GFR calc non Af Amer >60 >60 mL/min   GFR calc Af Amer >60 >60 mL/min    Comment: (NOTE) The eGFR has been calculated using the CKD EPI equation. This calculation has not been validated in all clinical situations. eGFR's persistently <60 mL/min signify possible Chronic Kidney Disease.    Anion gap 7 5 - 15    Comment: Performed at  [facility], [place] [address] [place], [place] [address]    Blood Alcohol level:  Lab Results  Component Value Date   [facility] <5 08/12/2016   ETH <5 75/88/3254    Metabolic Disorder Labs: Lab Results  Component Value Date   HGBA1C 4.7 (L) 02/03/2017   MPG 88 02/03/2017   MPG  108 10/31/2015   Lab Results  Component Value Date   PROLACTIN 121.9 (H) 02/03/2017   Lab Results  Component Value Date   CHOL 214 (H) 02/03/2017   TRIG 117 02/03/2017   HDL 42 02/03/2017   CHOLHDL 5.1 02/03/2017   VLDL 23 02/03/2017   LDLCALC 149 (H) 02/03/2017    Physical Findings: AIMS: Facial and Oral Movements Muscles of Facial Expression: None, normal Lips and Perioral Area: None, normal Jaw: None, normal Tongue: None, normal,Extremity Movements Upper (arms, wrists, hands, fingers): None, normal Lower (legs, knees, ankles, toes): None, normal, Trunk Movements Neck, shoulders, hips: None, normal, Overall Severity Severity of abnormal movements (highest score from questions above): None, normal Incapacitation due to abnormal movements: None, normal Patient's awareness of abnormal movements (rate only patient's report): No Awareness, Dental Status Current problems with teeth and/or dentures?: No Does patient usually wear dentures?: No  CIWA:    COWS:     Musculoskeletal: Strength & Muscle Tone: abnormal and history of quadriparesia Gait & Station: has been able to walk with assistance, but mostly still mobilizing in wheel chair  Patient leans: N/A  Psychiatric Specialty Exam: Physical Exam  Nursing note and vitals reviewed. Constitutional: She is oriented to person, place, and time. She appears well-developed.  Cardiovascular: Normal rate.   Neurological: She is alert and oriented to person, place, and time.  Skin: Skin is warm.  Psychiatric: She has a normal mood and affect. Her behavior is normal.    Review of Systems  Psychiatric/Behavioral: Positive for  depression and suicidal ideas.   no chest pain, no shortness of breath , no vomiting   Blood pressure 114/66, pulse 74, temperature 98.5 F (36.9 C), temperature source Oral, resp. rate 16, height 5' 5"  (1.651 m), weight 62.1 kg (137 lb), last menstrual period 01/22/2017, SpO2 100 %.Body mass index is 22.8 kg/m.  General Appearance: Well Groomed  Eye Contact:  Good  Speech:  Normal Rate  Volume:  Normal  Mood: less severely depressed   Affect:  less constrited, more reactive   Thought Process:  Goal Directed and Descriptions of Associations: Intact  Orientation:  Full (Time, Place, and Person)  Thought Content:   No hallucinations, no delusions   Suicidal Thoughts:  Denies any current active suicidal ideations, denies homicidal or violent ideations    Homicidal Thoughts:  No   Memory:  Recent and remote grossly intact   Judgement:  Other:  improved   Insight:  improved   Psychomotor Activity:  limited, mobilizes in wheel chair or with cane  Concentration:  Concentration: Good and Attention Span: Good  Recall:  Good  Fund of Knowledge:  Good  Language:  Good  Akathisia:  Negative  Handed:  Right  AIMS (if indicated):     Assets:  Desire for Improvement Resilience  ADL's:  Intact  Cognition:  WNL  Sleep:  Number of Hours: 5.5   Assessment - patient is presenting with gradually improving mood, although reports chronic depression, anxiety, chronic suicidal ideations. At this time denies suicidal plan or intention, and intensity of passive SI has decreased . Remains anxious about discharging but better able to discuss dispo planning and clearly stating that her preference is to return to live with her mother after discharge. Some sedation noted, which she feels is temporally related to Abilify.   Treatment Plan Summary: Daily contact with patient to assess and evaluate symptoms and progress in treatment, Medication management, Plan inpatient treatment and medications as below as of  02/14/17    Continue  Bactrim DS  800-160 mg for bacteriuria- patient denies symptoms of UTI- medication side effects reviewed Encourage group and milieu participation to work on coping skills and symptom reduction Continue Lexapro  10  mgrs QDAY for depression , anxiety.   Change Abilify 5 mgrs to QHS  as antidepressant augmentation - to minimize day time sedation  Continue Lithium 300 mgrs QAM and 600 mgrs QHS for mood disorder, antidepressant augmentation,suicidal ideations.   Decrease  Klonopin to 1.5  mgrs QHS for anxiety, insomnia Treatment team working on disposition planning options - see above   Neita Garnet, MD  02/14/2017, 2:18 PM    Patient ID: Dorris Singh, female   DOB: 03-28-1971, 46 y.o.   MRN: [id number]

## 2017-02-15 ENCOUNTER — Ambulatory Visit (HOSPITAL_COMMUNITY): Payer: Self-pay | Admitting: Psychiatry

## 2017-02-15 NOTE — Progress Notes (Signed)
Per provider on call, pt does not need to be awakened at 0400 for VS, but will be assessed in the morning at routine 0600 VS.  At midnight, pt was not having any issues from the incident at 2200 and stated that she was having no pain.  Pt was again encouraged to wait until staff was present to try to get up should she need to go to the bathroom.  Pt continues to be monitored for safety q15 minute checks.

## 2017-02-15 NOTE — Progress Notes (Signed)
Shift note:  Pt has been in the dayroom all evening.  She reports that her day has been "ok".  She continues to have passive suicidal thoughts, but says that is an on-going thing with her.  She contracts for safety on the unit.  She denies HI/AVH.  She says that at discharge she will return home to live with her mother.  She says her mother has Parkinson's disease and that they help each other.  She says that they cannot afford outside help.  Pt states that she is able to do most things for herself.  Pt denies pain, and voices no needs or concerns at the time of assessment.  Support and encouragement offered.  Discharge plans are in process.  Pt was encouraged to make her needs known to staff.  Safety maintained with q15 minute checks.

## 2017-02-15 NOTE — Progress Notes (Signed)
D: Natasha [name] has been calm, cooperative, and appropriate. Although she has been less lethargic than noted or reported on previous days, she did doze in her wheelchair in the dayroom this afternoon. She rated her depression 5/10, hopelessness 9/10, and anxiety 5/10. She admitted some SI on her self inventory sheet, but she contracted for safety with this Clinical research associatewriter.   A: Meds given as ordered. No PRNs requested or required. Q15 safety checks maintained. Support/encouragement offered.  R: Pt remains free from harm and continues with treatment. Will continue to monitor for needs/safety.

## 2017-02-15 NOTE — Progress Notes (Signed)
Adult Psychoeducational Group Note  Date:  02/15/2017 Time:  8:34 PM  Group Topic/Focus:  Wrap-Up Group:   The focus of this group is to help patients review their daily goal of treatment and discuss progress on daily workbooks.  Participation Level:  Active  Participation Quality:  Appropriate  Affect:  Appropriate  Cognitive:  Alert  Insight: Appropriate  Engagement in Group:  Engaged  Modes of Intervention:  Activity  Additional Comments:  Pt rated her day 6/10. Pt stated that she is going home tomorrow and is hopeful for a smooth transition.  Kaleen OdeaCOOKE, Tyneka Scafidi R 02/15/2017, 8:34 PM

## 2017-02-15 NOTE — Progress Notes (Signed)
[name] MD Progress Note  02/15/2017 2:04 PM Natasha [name]  MRN:  [id number]   Subjective: patient states she feels she is improving gradually . Today she does state she notices a clear improvement in her prior sedation, and states she has not been feeling sleepy or nodding off today. She remains anxious about discharging home, but more future oriented, states " I think I will be OK, my mom and I take good care of each other". Denies medication side effects at this time .  Objective:  Have reviewed case with treatment team and have met with patient. Patient is presenting with partially improved mood and range of affect. She describes chronic depression and chronic  intermittent suicidal , mostly passive , ideations but at this time denies any suicidal ideations and is future oriented. She also seems more future oriented, and spoke about making efforts to become more socially active and to spend more time doing enjoyable activities in the future. Denies medication side effects- at this time fully alert, attentive. ( Abilify was felt to be contributing to sedation and was changed to QHS dosing ). Pleasant on approach, visible in day room.   Principal Problem: MDD (major depressive disorder), recurrent severe, without psychosis ([place]) Diagnosis:   Patient Active Problem List   Diagnosis Date Noted  . MDD (major depressive disorder), recurrent severe, without psychosis ([place]) [F33.2] 02/02/2017  . MDD (major depressive disorder) [F32.9] 10/02/2015  . Severe recurrent major depression without psychotic features ([place]) [F33.2] 06/19/2015   Total Time spent with patient: 20 minutes  Past Medical History:  Past Medical History:  Diagnosis Date  . C6 spinal cord injury ([place])   . Depression     Past Surgical History:  Procedure Laterality Date  . BACK SURGERY     Family History:  Family History  Problem Relation Age of Onset  . Alcohol abuse Father   . Alcohol abuse Brother   . Depression  Mother   . Alcohol abuse Mother    Social History:  History  Alcohol Use No     History  Drug Use No    Social History   Social History  . Marital status: Single    Spouse name: N/A  . Number of children: N/A  . Years of education: N/A   Social History Main Topics  . Smoking status: Never Smoker  . Smokeless tobacco: Never Used     Comment: No smoking hx; no need for cessation materials  . Alcohol use No  . Drug use: No  . Sexual activity: No   Other Topics Concern  . None   Social History Narrative  . None   Additional Social History:    Pain Medications: pt denies abuse - see pta meds list Prescriptions: pt denies abuse - see pta meds list Over the Counter: pt denies abuse - see pta meds list History of alcohol / drug use?: No history of alcohol / drug abuse Longest period of sobriety (when/how long): n/a  Sleep: Good  Appetite: better   Current Medications: Current Facility-Administered Medications  Medication Dose Route Frequency Provider Last Rate Last Dose  . acetaminophen (TYLENOL) tablet 650 mg  650 mg Oral Q6H PRN Ethelene Hal, NP   650 mg at 02/13/17 1130  . alum & mag hydroxide-simeth (MAALOX/MYLANTA) 200-200-20 MG/5ML suspension 30 mL  30 mL Oral Q4H PRN Ethelene Hal, NP      . ARIPiprazole (ABILIFY) tablet 5 mg  5 mg Oral QHS [name]  Jarell Mcewen, MD      . clonazePAM (KLONOPIN) tablet 1.5 mg  1.5 mg Oral QHS [name], MD   1.5 mg at 02/14/17 2307  . cyclobenzaprine (FLEXERIL) tablet 5 mg  5 mg Oral QHS PRN [name], MD      . escitalopram (LEXAPRO) tablet 10 mg  10 mg Oral Daily [name], MD   10 mg at 02/15/17 0737  . feeding supplement (ENSURE ENLIVE) (ENSURE ENLIVE) liquid 237 mL  237 mL Oral BID BM [name] Kennedie Pardoe, MD   237 mL at 02/14/17 0824  . hydrOXYzine (ATARAX/VISTARIL) tablet 25 mg  25 mg Oral Q6H PRN Ethelene Hal, NP   25 mg at 02/06/17 1613  . lithium carbonate capsule 300 mg  300 mg Oral  Daily [name], MD   300 mg at 02/15/17 0737  . lithium carbonate capsule 600 mg  600 mg Oral QHS [name], MD   600 mg at 02/14/17 2307  . magnesium hydroxide (MILK OF MAGNESIA) suspension 30 mL  30 mL Oral Daily PRN Ethelene Hal, NP      . sulfamethoxazole-trimethoprim (BACTRIM DS,SEPTRA DS) 800-160 MG per tablet 1 tablet  1 tablet Oral Q12H Derrill Center, NP   1 tablet at 02/15/17 3244    Lab Results:  No results found for this or any previous visit (from the past 48 hour(s)).  Blood Alcohol level:  Lab Results  Component Value Date   [facility] <5 08/12/2016   ETH <5 12/15/7251    Metabolic Disorder Labs: Lab Results  Component Value Date   HGBA1C 4.7 (L) 02/03/2017   MPG 88 02/03/2017   MPG 108 10/31/2015   Lab Results  Component Value Date   PROLACTIN 121.9 (H) 02/03/2017   Lab Results  Component Value Date   CHOL 214 (H) 02/03/2017   TRIG 117 02/03/2017   HDL 42 02/03/2017   CHOLHDL 5.1 02/03/2017   VLDL 23 02/03/2017   LDLCALC 149 (H) 02/03/2017    Physical Findings: AIMS: Facial and Oral Movements Muscles of Facial Expression: None, normal Lips and Perioral Area: None, normal Jaw: None, normal Tongue: None, normal,Extremity Movements Upper (arms, wrists, hands, fingers): None, normal Lower (legs, knees, ankles, toes): None, normal, Trunk Movements Neck, shoulders, hips: None, normal, Overall Severity Severity of abnormal movements (highest score from questions above): None, normal Incapacitation due to abnormal movements: None, normal Patient's awareness of abnormal movements (rate only patient's report): No Awareness, Dental Status Current problems with teeth and/or dentures?: No Does patient usually wear dentures?: No  CIWA:    COWS:     Musculoskeletal: Strength & Muscle Tone: history of quadriparesia  Gait & Station: mobilizes in wheel chair Patient leans: N/A  Psychiatric Specialty Exam: Physical Exam  Nursing note and  vitals reviewed. Constitutional: She is oriented to person, place, and time. She appears well-developed.  Cardiovascular: Normal rate.   Neurological: She is alert and oriented to person, place, and time.  Skin: Skin is warm.  Psychiatric: She has a normal mood and affect. Her behavior is normal.    Review of Systems  Psychiatric/Behavioral: Positive for depression and suicidal ideas.   no nausea, no vomiting, no rash, no fever, no chills Denies any dysuria or urgency    Blood pressure 98/65, pulse 72, temperature 98.6 F (37 C), temperature source Oral, resp. rate 16, height 5' 5"  (1.651 m), weight 62.1 kg (137 lb), last menstrual period 01/22/2017, SpO2 98 %.Body mass  index is 22.8 kg/m.  General Appearance: Well Groomed  Eye Contact:  Good  Speech:  Normal Rate  Volume:  Normal  Mood: gradually improving   Affect:  more reactive, smiles at times appropriately   Thought Process:  Linear and Descriptions of Associations: Intact  Orientation:  Full (Time, Place, and Person)  Thought Content:  No psychotic symptoms, not internally preoccupied    Suicidal Thoughts:  No suicidal or self injurious ideations    Homicidal Thoughts:  No denies homicidal or violent ideations  Memory:  Recent and remote grossly intact   Judgement:  Other:  improving   Insight:  Present  Psychomotor Activity:  more visible in day room, mobilizes in wheel chair  Concentration:  Concentration: Good and Attention Span: Good  Recall:  Good  Fund of Knowledge:  Good  Language:  Good  Akathisia:  Negative  Handed:  Right  AIMS (if indicated):     Assets:  Communication Skills Resilience  ADL's:  Intact  Cognition:  WNL  Sleep:  Number of Hours: 4.75   Assessment - improved overall mood, compared to admission presentation. Less severely depressed, more reactive affect, more future oriented. Currently no active suicidal ideations . Tolerating medications well, and less sedated after Abilify dose changed to   QHS.   Treatment Plan Summary:  Daily contact with patient to assess and evaluate symptoms and progress in treatment, Medication management, Plan inpatient treatment and medications as below as of 02/15/17   Continue  Bactrim DS  800-160 mg for bacteriuria- patient denies symptoms of UTI- medication side effects reviewed Encourage group and milieu participation to work on coping skills and symptom reduction Continue Lexapro  10  mgrs QDAY for depression , anxiety.   Continue Abilify 5 mgrs  QHS  as antidepressant augmentation - to minimize day time sedation  Continue Lithium 300 mgrs QAM and 600 mgrs QHS for mood disorder, antidepressant augmentation,suicidal ideations.   Continue  Klonopin  1.5  mgrs QHS for anxiety, insomnia Treatment team working on disposition planning options- patient states she is intending to return home- lives with mother, states mother would be able to pick her up tomorrow if discharged.   [name], MD  02/15/2017, 2:04 PM    Patient ID: Natasha [name], female   DOB: 04-07-1971, 46 y.o.   MRN: [id number]

## 2017-02-15 NOTE — [facility] Group Notes (Signed)
[facility] Group Notes:  (Nursing/MHT/Case Management/Adjunct)  Date:  02/15/2017  Time:  9:14 AM  Type of Therapy:  Nurse Education  Participation Level:  Active  Participation Quality:  Appropriate  Affect:  Appropriate  Cognitive:  Appropriate  Insight:  Appropriate  Engagement in Group:  Engaged  Modes of Intervention:  Discussion, Education and Support  Summary of Progress/Problems: Pt shared that she is preparing to go home tomorrow.  Maurine SimmeringShugart, Jerel Sardina M 02/15/2017, 9:14 AM

## 2017-02-15 NOTE — Progress Notes (Signed)
Recreation Therapy Notes  Animal-Assisted Activity (AAA) Program Checklist/Progress Notes Patient Eligibility Criteria Checklist & Daily Group note for Rec TxIntervention  Date: 03.06.2018 Time: 2:50pm [address]    AAA/T Program Assumption of Risk Form signed by Patient/ or Parent Legal Guardian Yes  Patient is free of allergies or sever asthma Yes  Patient reports no fear of animals Yes  Patient reports no history of cruelty to animals Yes  Patient understands his/her participation is voluntary Yes  Patient washes hands before animal contact Yes  Patient washes hands after animal contact Yes  Behavioral Response: Appropriate   Education:Hand Washing, Appropriate Animal Interaction   Education Outcome: Acknowledges education.   Clinical Observations/Feedback: Patient attended session and interacted appropriately with therapy dog and peers.  Marykay Lexenise L Keyron Pokorski, LRT/CTRS       Arcangel Minion L 02/15/2017 3:09 PM

## 2017-02-15 NOTE — [facility] Group Notes (Signed)
[facility] LCSW Group Therapy 02/15/2017 1:15 PM  Type of Therapy: Group Therapy- Feelings about Diagnosis  Participation Level: Active   Participation Quality:  Appropriate  Affect:  Appropriate  Cognitive: Alert and Oriented   Insight:  Developing   Engagement in Therapy: Developing/Improving and Engaged   Modes of Intervention: Clarification, Confrontation, Discussion, Education, Exploration, Limit-setting, Orientation, Problem-solving, Rapport Building, Dance movement psychotherapisteality Testing, Socialization and Support  Description of Group:   This group will allow patients to explore their thoughts and feelings about diagnoses they have received. Patients will be guided to explore their level of understanding and acceptance of these diagnoses. Facilitator will encourage patients to process their thoughts and feelings about the reactions of others to their diagnosis, and will guide patients in identifying ways to discuss their diagnosis with significant others in their lives. This group will be process-oriented, with patients participating in exploration of their own experiences as well as giving and receiving support and challenge from other group members.  Summary of Progress/Problems:   Pt states that she has had a lot of feelings of shame about her diagnosis. A lot of people in her life want to brush her problems under the rug, which is difficult for her because she finds it helpful to talk about her problems. Pt also mentioned that she has a lot of negative self talk that she would like to replace with positive affirmations such as "I'm a survivor".  Therapeutic Modalities:   Cognitive Behavioral Therapy Solution Focused Therapy Motivational Interviewing Relapse Prevention Therapy  Jonathon JordanLynn B Irfan Veal, MSW, LCSWA 02/15/2017 3:15 PM

## 2017-02-16 MED ORDER — ESCITALOPRAM OXALATE 10 MG PO TABS
10.0000 mg | ORAL_TABLET | Freq: Every day | ORAL | 0 refills | Status: DC
Start: 2017-02-17 — End: 2017-02-25

## 2017-02-16 MED ORDER — CLONAZEPAM 0.5 MG PO TABS: 1.5000 mg | ORAL_TABLET | Freq: Every day | ORAL | 0 refills | Status: DC

## 2017-02-16 MED ORDER — LITHIUM CARBONATE 300 MG PO CAPS: ORAL_CAPSULE | ORAL | 0 refills | Status: DC

## 2017-02-16 MED ORDER — HYDROXYZINE HCL 25 MG PO TABS: 25.0000 mg | ORAL_TABLET | Freq: Four times a day (QID) | ORAL | 0 refills | Status: DC | PRN

## 2017-02-16 MED ORDER — SULFAMETHOXAZOLE-TRIMETHOPRIM 800-160 MG PO TABS: 1.0000 | ORAL_TABLET | Freq: Two times a day (BID) | ORAL | Status: DC

## 2017-02-16 MED ORDER — ARIPIPRAZOLE 5 MG PO TABS: 5.0000 mg | ORAL_TABLET | Freq: Every day | ORAL | 0 refills | Status: DC

## 2017-02-16 NOTE — Progress Notes (Signed)
Natasha [name]. [name] had been up and visible in milieu this evening, attended and participated in evening group activity. She spoke about discharge in the morning and spoke about how she feels ready but nervous at the same time. She spoke about how her mother will come and get her. Natasha [name] also spoke about sleeping ok, did not verbalize any complaints of pain and received all bedtime medications without incident. A. Support and encouragement provided. R. Safety maintained, will continue to monitor.

## 2017-02-16 NOTE — Progress Notes (Signed)
Patient ID: Natasha [name], female   DOB: [birth year], 46 y.o.   MRN: [id number]  Discharge Note: Belongings returned to patient at time of discharge. Discharge instructions and medications were reviewed with patient. Patient verbalized understanding of both medications and discharge instructions. Patient was escorted via wheelchair to her mother's car who was picking her up. She got into the care safely with help from security guard Air traffic controllerDanny and writer. Patient's belongings were placed in the car. Patient discharged without distress. Q15 minute safety checks maintained until discharge.

## 2017-02-16 NOTE — [facility] Suicide Risk Assessment (Signed)
[facility] Discharge Suicide Risk Assessment   Principal Problem: MDD (major depressive disorder), recurrent severe, without psychosis ([facility]) Discharge Diagnoses:  Patient Active Problem List   Diagnosis Date Noted  . MDD (major depressive disorder), recurrent severe, without psychosis ([facility]) [F33.2] 02/02/2017  . MDD (major depressive disorder) [F32.9] 10/02/2015  . Severe recurrent major depression without psychotic features ([facility]) [F33.2] 06/19/2015    Total Time spent with patient: 30 minutes  Musculoskeletal: Strength & Muscle Tone: history or quadriparesia Gait & Station: mobilizes with crutches or in wheel chair  Patient leans: N/A  Psychiatric Specialty Exam: ROS no headache, no chest pain or shortness of breath, no vomiting  Blood pressure 110/62, pulse 76, temperature 98.1 F (36.7 C), temperature source Oral, resp. rate 16, height 5\' 5"  (1.651 m), weight 62.1 kg (137 lb), last menstrual period 01/22/2017, SpO2 98 %.Body mass index is 22.8 kg/m.  General Appearance: improved grooming   Eye Contact::  Good  Speech:  Normal Rate409  Volume:  Decreased  Mood:  improved, states she feels better than on admission   Affect:  less constricted, more reactive   Thought Process:  Linear and Descriptions of Associations: Intact  Orientation:  Full (Time, Place, and Person)  Thought Content:  no hallucinations,  no delusions, not internally preoccupied   Suicidal Thoughts:  Yes.  without intent/plan- has reported history of chronic suicidal ideations. At this time denies plan or intention of suicide or of hurting self and establishes her connection and sense of responsibility to her mother as a protective factor   Homicidal Thoughts:  No  Memory:  recent and remote grossly intact   Judgement:  Other:  improving   Insight:  improving  Psychomotor Activity:  Normal  Concentration:  Good  Recall:  Good  Fund of Knowledge:Good  Language: Good  Akathisia:  Negative  Handed:  Right  AIMS  (if indicated):     Assets:  Desire for Improvement Resilience  Sleep:  Number of Hours: 5.75  Cognition: WNL  ADL's:  Intact   Mental Status Per Nursing Assessment::   On Admission:  Suicidal ideation indicated by patient, Self-harm thoughts  Demographic Factors:  46 year old single female, on disability, lives with mother  Loss Factors: Chronic neurologic deficits, mother has Parkinson's Disorder   Historical Factors: History of depression, history of suicide attempt in the past by jumping off a building , history of prior psychiatric admissions   Risk Reduction Factors:   Sense of responsibility to family, Living with another person, especially a relative and Positive coping skills or problem solving skills  Continued Clinical Symptoms:  At this time patient is alert, attentive, well related, calm, pleasant on approach. Describes mood as partially improved, affect is fuller in range, denies any active suicidal or self injurious ideations, no homicidal ideations, no psychotic symptoms, future oriented, looking forward to returning home and helping her mother.  Denies any medication side effects at this time, sedation improved after Abilify was changed to QHS dosing . No disruptive or agitated behaviors on unit .  Cognitive Features That Contribute To Risk:  No gross cognitive deficits noted upon discharge. Is alert , attentive, and oriented x 3   Suicide Risk:  Mild:  Suicidal ideation of limited frequency, intensity, duration, and specificity.  There are no identifiable plans, no associated intent, mild dysphoria and related symptoms, good self-control (both objective and subjective assessment), few other risk factors, and identifiable protective factors, including available and accessible social support.  Follow-up Information  [facility] Follow up.   Specialty:  Behavioral Health Why:  3/12 @ 12pm w/ Tomma LightningFrankie for therapy. 3/16  @10 :30am w/ Dr. Lolly MustacheArfeen for medication management.  Contact information: [address] [place] [id number] [phone]          Plan Of Care/Follow-up recommendations:  Activity:  as tolerated  Diet:  Regular Tests:  NA Other:  See below  Patient is leaving unit in good spirits  Plans to return home- mother will be picking her up later today Patient plans to follow up as above   Craige CottaFernando A Dayonna Selbe, MD 02/16/2017, 12:41 PM

## 2017-02-16 NOTE — Progress Notes (Signed)
Recreation Therapy Notes  Date: 02/16/17 Time: 0930 Location: [address] Group Room  Group Topic: Stress Management  Goal Area(s) Addresses:  Patient will verbalize importance of using healthy stress management.  Patient will identify positive emotions associated with healthy stress management.   Intervention: Stress Management  Activity :  Mindfulness Meditation.  LRT introduced the stress management technique of mindfulness meditation.  LRT played a meditation from the Calm app to allow patients to participate in mindfulness.  Patients were to follow along as the meditation played.   Education:  Stress Management, Discharge Planning.   Education Outcome: Acknowledges edcuation/In group clarification offered/Needs additional education  Clinical Observations/Feedback: Pt did not attend group.   Caroll RancherMarjette Talon Witting, LRT/CTRS         Caroll RancherLindsay, Farhaan Mabee A 02/16/2017 11:34 AM

## 2017-02-16 NOTE — Discharge Summary (Signed)
Physician Discharge Summary Note  Patient:  Natasha [name] is an 46 y.o., female MRN:  [id number] DOB:  1971-08-27 Patient phone:  [phone] (home)  Patient address:   [place] [address],  Total Time spent with patient: Greater than 30 minutes  Date of Admission:  02/02/2017  Date of Discharge: 02-16-17  Reason for Admission: Worsening symptoms of depression & suicidal ideations.  Principal Problem: MDD (major depressive disorder), recurrent severe, without psychosis [facility])  Discharge Diagnoses: Patient Active Problem List   Diagnosis Date Noted  . MDD (major depressive disorder), recurrent severe, without psychosis ([place]) [F33.2] 02/02/2017  . MDD (major depressive disorder) [F32.9] 10/02/2015  . Severe recurrent major depression without psychotic features ([place]) [F33.2] 06/19/2015   Past Psychiatric History: Major depressive disorder, recurrent severe.  Past Medical History:  Past Medical History:  Diagnosis Date  . C6 spinal cord injury ([place])   . Depression     Past Surgical History:  Procedure Laterality Date  . BACK SURGERY     Family History:  Family History  Problem Relation Age of Onset  . Alcohol abuse Father   . Alcohol abuse Brother   . Depression Mother   . Alcohol abuse Mother    Family Psychiatric  History: See H&P  Social History:  History  Alcohol Use No     History  Drug Use No    Social History   Social History  . Marital status: Single    Spouse name: N/A  . Number of children: N/A  . Years of education: N/A   Social History Main Topics  . Smoking status: Never Smoker  . Smokeless tobacco: Never Used     Comment: No smoking hx; no need for cessation materials  . Alcohol use No  . Drug use: No  . Sexual activity: No   Other Topics Concern  . None   Social History Narrative  . None   Hospital Course: Patient is 46 year old single female, lives with her mother. She has history of Major Depression. She reports  she " had been doing relatively well for a while", and had felt more stable for several months. She says her mood has been worse recently, which she attributes in part to psychosocial stressors, as follows. She lives with her mother, who has been diagnosed with Parkinson's Disease, which has gradually been worsening. 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 States that it has been increasingly difficult for patient and mother to help each other out in daily activities, they have discussed possible transition into assisted living, but patient feels unready to make this step at this time. She reports recent suicidal ideations, with thoughts of self cutting, which she has not acted on.   Upon her admission to this hospital & after evaluation, Natasha [name] was started on medication regimen for her depressive symptoms. She was medicated & discharged on; Abilify 5 mg for mood control, Klonopin 1.5 mg for severe anxiety, Lexapro 5 mg for depression, Hydroxyzine 25 mg prn for anxiety & Lithium Carbonate 300 mg & 600 mg respectively for mood stabilization. She was also enrolled in the group counseling sessions/activities & AA/NA meetings being held on this unit to help him learn coping skills that will aid her achieve & maintain mood stability after discharge. Natasha [name] attended & participated in these activities as recommended.  Natasha [name] also received other medication regimen for the other medical conditions presented. She tolerated her treatment regimen  without any significant adverse effects & or reactions reported. Her symptoms did respond adequately to her treatment plan. This is evidenced by her reports of improved mood, presentation of good affect and reports of symptom reduction.  Natasha [name] met with her attending psychiatrist this a. m. Her treatment plan, reasons for admission & response to treatment regimen discussed. Natasha [name] endorsed that she is doing well  and ready to be discharged to her home with mother. It was agreed upon that she will continue psychiatric care on an outpatient basis as noted below. She is provided with all the necessary information needed to make this appointment without problems.  Upon discharge, Natasha [name] adamantly denies any SIHI, AVH, delusional thoughts or paranoia. She left [facility] with all personal belongings in no apparent distress. Transportation per her arrangement.  Physical Findings: AIMS: Facial and Oral Movements Muscles of Facial Expression: None, normal Lips and Perioral Area: None, normal Jaw: None, normal Tongue: None, normal,Extremity Movements Upper (arms, wrists, hands, fingers): None, normal Lower (legs, knees, ankles, toes): None, normal, Trunk Movements Neck, shoulders, hips: None, normal, Overall Severity Severity of abnormal movements (highest score from questions above): None, normal Incapacitation due to abnormal movements: None, normal Patient's awareness of abnormal movements (rate only patient's report): No Awareness, Dental Status Current problems with teeth and/or dentures?: No Does patient usually wear dentures?: No  CIWA:    COWS:     Musculoskeletal: Strength & Muscle Tone: within normal limits Gait & Station: normal Patient leans: N/A  Psychiatric Specialty Exam: Physical Exam  Constitutional: She is oriented to person, place, and time. She appears well-developed.  HENT:  Head: Normocephalic.  Eyes: Pupils are equal, round, and reactive to light.  Neck: Normal range of motion.  Cardiovascular: Normal rate.   Respiratory: Effort normal.  GI: Soft.  Genitourinary:  Genitourinary Comments: Denies any issues in this area  Musculoskeletal: Normal range of motion.  Neurological: She is alert and oriented to person, place, and time.  Skin: Skin is warm and dry.    Review of Systems  Constitutional: Negative.   HENT: Negative.   Eyes: Negative.   Respiratory: Negative.    Cardiovascular: Negative.   Gastrointestinal: Negative.   Genitourinary: Negative.   Musculoskeletal: Negative.   Skin: Negative.   Neurological: Negative.   Endo/Heme/Allergies: Negative.   Psychiatric/Behavioral: Positive for depression (Stable). Negative for hallucinations, memory loss, substance abuse and suicidal ideas. The patient has insomnia (Stable). The patient is not nervous/anxious.     Blood pressure 110/62, pulse 76, temperature 98.1 F (36.7 C), temperature source Oral, resp. rate 16, height 5' 5"  (1.651 m), weight 62.1 kg (137 lb), last menstrual period 01/22/2017, SpO2 98 %.Body mass index is 22.8 kg/m.  See Md's SRA   Have you used any form of tobacco in the last 30 days? (Cigarettes, Smokeless Tobacco, Cigars, and/or Pipes): No  Has this patient used any form of tobacco in the last 30 days? (Cigarettes, Smokeless Tobacco, Cigars, and/or Pipes): No  Blood Alcohol level:  Lab Results  Component Value Date   ETH <5 08/12/2016   ETH <5 91/66/0600   Metabolic Disorder Labs:  Lab Results  Component Value Date   HGBA1C 4.7 (L) 02/03/2017   MPG 88 02/03/2017   MPG 108 10/31/2015   Lab Results  Component Value Date   PROLACTIN 121.9 (H) 02/03/2017   Lab Results  Component Value Date   CHOL 214 (H) 02/03/2017   TRIG 117 02/03/2017   HDL 42 02/03/2017   CHOLHDL 5.1 02/03/2017  VLDL 23 02/03/2017   LDLCALC 149 (H) 02/03/2017   See Psychiatric Specialty Exam and Suicide Risk Assessment completed by Attending Physician prior to discharge.  Discharge destination:  Home  Is patient on multiple antipsychotic therapies at discharge:  No   Has Patient had three or more failed trials of antipsychotic monotherapy by history:  No  Recommended Plan for Multiple Antipsychotic Therapies: NA  Allergies as of 02/16/2017   No Known Allergies     Medication List    STOP taking these medications   FLUoxetine 20 MG capsule Commonly known as:  PROZAC   ibuprofen 200  MG tablet Commonly known as:  ADVIL,MOTRIN   OLANZapine 10 MG tablet Commonly known as:  ZYPREXA   oxymetazoline 0.05 % nasal spray Commonly known as:  AFRIN   SUMAtriptan 25 MG tablet Commonly known as:  IMITREX   VITAMIN D-3 PO     TAKE these medications     Indication  ARIPiprazole 5 MG tablet Commonly known as:  ABILIFY Take 1 tablet (5 mg total) by mouth at bedtime. For mood control  Indication:  Mood control   clonazePAM 0.5 MG tablet Commonly known as:  KLONOPIN Take 3 tablets (1.5 mg total) by mouth at bedtime. What changed:  medication strength  how much to take  Indication:  Severe anxiety   escitalopram 10 MG tablet Commonly known as:  LEXAPRO Take 1 tablet (10 mg total) by mouth daily. For depression Start taking on:  02/17/2017  Indication:  Major Depressive Disorder   hydrOXYzine 25 MG tablet Commonly known as:  ATARAX/VISTARIL Take 1 tablet (25 mg total) by mouth every 6 (six) hours as needed for anxiety.  Indication:  Anxiety Neurosis   lithium carbonate 300 MG capsule Take 1 tablet (300 mg) in the morning & 2 tablets (600 mg) at bedtime: For mood stabilization What changed:  additional instructions  Indication:  Mood stabilization   sulfamethoxazole-trimethoprim 800-160 MG tablet Commonly known as:  BACTRIM DS,SEPTRA DS Take 1 tablet by mouth every 12 (twelve) hours. For infection  Indication:  Infection      Follow-up Information    [facility] Follow up.   Specialty:  Behavioral Health Why:  3/12 @ 12pm w/ Tharon Aquas for therapy. 3/16 @10 :30am w/ Dr. Adele Schilder for medication management.  Contact information: [place] [phone]         Follow-up recommendations: Activity:  As tolerated Diet: As recommended by your primary care doctor. Keep all scheduled follow-up appointments as recommended.   Comments: Patient is instructed prior to discharge  to: Take all medications as prescribed by his/her mental healthcare provider. Report any adverse effects and or reactions from the medicines to his/her outpatient provider promptly. Patient has been instructed & cautioned: To not engage in alcohol and or illegal drug use while on prescription medicines. In the event of worsening symptoms, patient is instructed to call the crisis hotline, 911 and or go to the nearest ED for appropriate evaluation and treatment of symptoms. To follow-up with his/her primary care provider for your other medical issues, concerns and or health care needs.   Signed: Encarnacion Slates, NP, PMHNP, FNP-BC 02/16/2017, 10:57 AM

## 2017-02-16 NOTE — Progress Notes (Signed)
Patient ID: [name], female   DOB: March 22, 1971, 46 y.o.   MRN: [id number]  DAR: Pt. Denies HI and A/V Hallucinations. Patient reports passive SI but contracts for safety. MD [place] was made aware of this during treatment team. She reports she is ready for discharge, just nervous about going back home. She reports sleep is poor, appetite is fair, energy level is low, and concentration is poor. She rates depression 4/10, hopelessness 8/10, and anxiety 7/10. Patient does not report any pain or discomfort at this time. Support and encouragement provided to the patient. Scheduled medications administered to patient per physician's orders. Patient is minimal but cooperative. She is seen in the dayroom throughout the milieu and is utilizing her wheelchair to ambulate. Q15 minute checks are maintained for safety.

## 2017-02-21 ENCOUNTER — Ambulatory Visit (HOSPITAL_COMMUNITY): Payer: Self-pay | Admitting: Clinical

## 2017-02-25 ENCOUNTER — Ambulatory Visit (INDEPENDENT_AMBULATORY_CARE_PROVIDER_SITE_OTHER): Payer: Medicare Other | Admitting: Psychiatry

## 2017-02-25 ENCOUNTER — Encounter (HOSPITAL_COMMUNITY): Payer: Self-pay | Admitting: Psychiatry

## 2017-02-25 VITALS — BP 122/70 | HR 82 | Ht 60.0 in | Wt 125.0 lb

## 2017-02-25 DIAGNOSIS — F332 Major depressive disorder, recurrent severe without psychotic features: Secondary | ICD-10-CM

## 2017-02-25 DIAGNOSIS — F4310 Post-traumatic stress disorder, unspecified: Secondary | ICD-10-CM

## 2017-02-25 DIAGNOSIS — Z811 Family history of alcohol abuse and dependence: Secondary | ICD-10-CM

## 2017-02-25 DIAGNOSIS — Z79899 Other long term (current) drug therapy: Secondary | ICD-10-CM

## 2017-02-25 DIAGNOSIS — Z818 Family history of other mental and behavioral disorders: Secondary | ICD-10-CM

## 2017-02-25 MED ORDER — ARIPIPRAZOLE 5 MG PO TABS: 5.0000 mg | ORAL_TABLET | Freq: Every day | ORAL | 0 refills | Status: DC

## 2017-02-25 MED ORDER — CLONAZEPAM 0.5 MG PO TABS: 0.5000 mg | ORAL_TABLET | Freq: Three times a day (TID) | ORAL | 0 refills | Status: DC | PRN

## 2017-02-25 MED ORDER — ESCITALOPRAM OXALATE 20 MG PO TABS: 20.0000 mg | ORAL_TABLET | Freq: Every day | ORAL | 0 refills | Status: DC

## 2017-02-25 MED ORDER — LITHIUM CARBONATE 300 MG PO CAPS: ORAL_CAPSULE | ORAL | 0 refills | Status: DC
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
## 2017-02-28 ENCOUNTER — Ambulatory Visit (HOSPITAL_COMMUNITY): Payer: Self-pay | Admitting: Clinical

## 2017-03-07 ENCOUNTER — Encounter (HOSPITAL_COMMUNITY): Payer: Self-pay | Admitting: Clinical

## 2017-03-07 ENCOUNTER — Ambulatory Visit (INDEPENDENT_AMBULATORY_CARE_PROVIDER_SITE_OTHER): Payer: Medicare Other | Admitting: Clinical

## 2017-03-07 DIAGNOSIS — F332 Major depressive disorder, recurrent severe without psychotic features: Secondary | ICD-10-CM | POA: Diagnosis not present

## 2017-03-07 DIAGNOSIS — F4310 Post-traumatic stress disorder, unspecified: Secondary | ICD-10-CM

## 2017-03-09 NOTE — Progress Notes (Signed)
   THERAPIST PROGRESS NOTE  Session Time: 12:00 -12:55  Participation Level: Active  Behavioral Response: CasualAlertDepressed  Type of Therapy: Individual Therapy  Treatment Goals addressed: improve psychiatric symptoms, healthy coping skills  Interventions: Motivational interviewing  Summary: Natasha [name] is [name] 46 y.o. female who presents with Major depressive disorder, recurrent, severe without psychotic features and Post-traumatic stress disorder (PTSD)  Suicidal/Homicidal: No -without intent/plan  Therapist Response:  Natasha [name] met with clinician for an individual session. Natasha [name] discussed her psychiatric symptoms, her current life events. Client worked together to review and update her assessment. Natasha [name] was hospitalized  for suicidal ideation 21st - march 8th. Is now returning to individual therapy. Client and clinician discussed her symptoms, life stressor and goals for therapy. Client and clinician reviewed some healthy coping skills.  Plan: Return again in 1 weeks.  Diagnosis: Axis I: Major Depressive Disorder, recurrent, severe without psychotic features and PTSD   Natasha Pagett A, LCSW 03/09/2017
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
## 2017-03-14 DIAGNOSIS — F329 Major depressive disorder, single episode, unspecified: Secondary | ICD-10-CM | POA: Diagnosis not present

## 2017-03-14 DIAGNOSIS — F332 Major depressive disorder, recurrent severe without psychotic features: Secondary | ICD-10-CM | POA: Diagnosis not present

## 2017-03-22 ENCOUNTER — Ambulatory Visit (HOSPITAL_COMMUNITY): Payer: Self-pay | Admitting: Clinical

## 2017-03-24 ENCOUNTER — Encounter (HOSPITAL_COMMUNITY): Payer: Self-pay | Admitting: Psychiatry

## 2017-03-24 ENCOUNTER — Ambulatory Visit (INDEPENDENT_AMBULATORY_CARE_PROVIDER_SITE_OTHER): Payer: Medicare Other | Admitting: Psychiatry

## 2017-03-24 DIAGNOSIS — F332 Major depressive disorder, recurrent severe without psychotic features: Secondary | ICD-10-CM

## 2017-03-24 DIAGNOSIS — Z811 Family history of alcohol abuse and dependence: Secondary | ICD-10-CM | POA: Diagnosis not present

## 2017-03-24 DIAGNOSIS — F4310 Post-traumatic stress disorder, unspecified: Secondary | ICD-10-CM | POA: Diagnosis not present

## 2017-03-24 DIAGNOSIS — Z818 Family history of other mental and behavioral disorders: Secondary | ICD-10-CM | POA: Diagnosis not present

## 2017-03-24 DIAGNOSIS — Z79899 Other long term (current) drug therapy: Secondary | ICD-10-CM | POA: Diagnosis not present

## 2017-03-24 MED ORDER — ESCITALOPRAM OXALATE 20 MG PO TABS: 20.0000 mg | ORAL_TABLET | Freq: Every day | ORAL | 2 refills | Status: DC

## 2017-03-24 MED ORDER — CLONAZEPAM 0.5 MG PO TABS: 0.5000 mg | ORAL_TABLET | Freq: Three times a day (TID) | ORAL | 2 refills | Status: DC | PRN

## 2017-03-24 MED ORDER — ARIPIPRAZOLE 5 MG PO TABS: 5.0000 mg | ORAL_TABLET | Freq: Every day | ORAL | 2 refills | Status: DC

## 2017-03-24 MED ORDER — LITHIUM CARBONATE 300 MG PO CAPS: ORAL_CAPSULE | ORAL | 2 refills | Status: DC
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
## 2017-04-05 ENCOUNTER — Ambulatory Visit (INDEPENDENT_AMBULATORY_CARE_PROVIDER_SITE_OTHER): Payer: Medicare Other | Admitting: Clinical

## 2017-04-05 DIAGNOSIS — F332 Major depressive disorder, recurrent severe without psychotic features: Secondary | ICD-10-CM | POA: Diagnosis not present

## 2017-04-05 DIAGNOSIS — F4310 Post-traumatic stress disorder, unspecified: Secondary | ICD-10-CM

## 2017-04-05 NOTE — Progress Notes (Signed)
   THERAPIST PROGRESS NOTE  Session Time: 1:30 - 2:25  Participation Level: Active  Behavioral Response: CasualAlertDepressed  Type of Therapy: Individual Therapy  Treatment Goals addressed: improve psychiatric symptoms, elevate mood, improve unhelpful thought patterns,    healthy coping skills  Interventions: Motivational Interviewing,  Summary: Natasha [name] is a 46 y.o. female who presents with Major depressive disorder, recurrent, severe without psychotic features and Post-traumatic stress disorder (PTSD)  Suicidal/Homicidal: No -without intent/plan  Therapist Response:  Myracle met with clinician for an individual session. [name] discussed her psychiatric symptoms and her current life events. [name] shared that she has been feeling very depressed lately. Clinician asked open ended questions and She shared that it is becoming obvious to everyone that her and her mother are having difficulty living independently. She shared that she has been feeling shut down emotionally. Clinician asked open ended questions about Jenifers thoughts about moving to an assisted living facility. [name] shared her conflicting emotions and also her desire to please her mother. Client and clinician discussed her thoughts and desires about moving. Client and clinician discussed how she could share those with her mother. Client and clinician discussed healthy coping skills to help her deal with the delayed and/or eventual transition, [name] shared that she felt less numb when she recognized she has a voice in the matter even if she cannot fully make the decision on her own. She denied any current suicidal or  nhomicidal ideation    Plan: Return again in 1 weeks.  Diagnosis: Axis I: Major Depressive Disorder, recurrent, severe without psychotic features and PTSD    Mattias Walmsley A, LCSW 04/05/2017

## 2017-04-08 ENCOUNTER — Encounter (HOSPITAL_COMMUNITY): Payer: Self-pay | Admitting: Clinical

## 2017-04-19 ENCOUNTER — Ambulatory Visit (INDEPENDENT_AMBULATORY_CARE_PROVIDER_SITE_OTHER): Payer: Medicare Other | Admitting: Clinical

## 2017-04-19 ENCOUNTER — Encounter (HOSPITAL_COMMUNITY): Payer: Self-pay | Admitting: Clinical

## 2017-04-19 DIAGNOSIS — F4310 Post-traumatic stress disorder, unspecified: Secondary | ICD-10-CM | POA: Diagnosis not present

## 2017-04-19 DIAGNOSIS — F332 Major depressive disorder, recurrent severe without psychotic features: Secondary | ICD-10-CM

## 2017-04-19 NOTE — Progress Notes (Signed)
   THERAPIST PROGRESS NOTE  Session Time: 1:30 - 2:25  Participation Level: Active  Behavioral Response: CasualAlertDepressed  Type of Therapy: Individual Therapy  Treatment Goals addressed: improve psychiatric symptoms, elevate mood , improve unhelpful thought patterns, , healthy coping skills  Interventions: CBT and Motivational Interviewing,  Summary: Natasha [name] is a 46 y.o. female who presents with Major depressive disorder, recurrent, severe without psychotic features and Post-traumatic stress disorder (PTSD)  Suicidal/Homicidal: No -without intent/plan  Therapist Response:  Natasha [name] met with clinician for an individual session. Carry discussed her psychiatric symptoms and her current life events. Natasha [name] shared that she has been struggling with her depression. Clinician asked open ended questions. Natasha [name] shared that she was upset with herself because she has stopped walking. Clinician asked open ended questions and Natasha [name] shared about her conflicting thoughts about staying in the house verses going to an assisted living center. She explored her thoughts and emotions.  Clinician asked open ended questions and Natasha [name] shared the pros and cons of each option. Natasha [name] then identified the choice she preferred at this point. Client and clinician discussed the power of deciding what she wants and to view it as a choice.   Plan: Return again in 1 weeks.  Diagnosis: Axis I: Major Depressive Disorder, recurrent, severe without psychotic features and PTSD    Nicholes Hibler A, LCSW 04/19/2017

## 2017-04-23 ENCOUNTER — Other Ambulatory Visit (HOSPITAL_COMMUNITY): Payer: Self-pay | Admitting: Psychiatry

## 2017-04-23 DIAGNOSIS — F332 Major depressive disorder, recurrent severe without psychotic features: Secondary | ICD-10-CM

## 2017-05-03 ENCOUNTER — Ambulatory Visit (INDEPENDENT_AMBULATORY_CARE_PROVIDER_SITE_OTHER): Payer: Medicare Other | Admitting: Clinical

## 2017-05-03 ENCOUNTER — Encounter (HOSPITAL_COMMUNITY): Payer: Self-pay | Admitting: Clinical

## 2017-05-03 DIAGNOSIS — F332 Major depressive disorder, recurrent severe without psychotic features: Secondary | ICD-10-CM | POA: Diagnosis not present

## 2017-05-03 DIAGNOSIS — F4310 Post-traumatic stress disorder, unspecified: Secondary | ICD-10-CM

## 2017-05-03 NOTE — Progress Notes (Signed)
   THERAPIST PROGRESS NOTE  Session Time: 1:27 - 2:25  Participation Level: Active  Behavioral Response: NeatAlertDepressed  Type of Therapy: Individual Therapy  Treatment Goals addressed: improve psychiatric symptoms, elevate mood, improve unhelpful thought patterns, healthy coping skills  Interventions: CBT and Motivational Interviewing   Summary: Natasha [name] is a 46 y.o. female who presents with Major depressive disorder, recurrent, severe without psychotic features and Post-traumatic stress disorder (PTSD)  Suicidal/Homicidal: No -without intent/plan  Therapist Response:  Natasha [name] met with clinician for an individual session. Natasha [name] discussed her psychiatric symptoms and her current life events. Natasha [name] shared that she has been struggling emotionally and physically. Clinician asked open ended questions and She shared that she has been falling out of bed and has stopped walking.  She reported that she continues to feel emotionally numb. Clinician encouraged her to see a doctor about her physical symptoms. Clinician asked about her feeling about moving to an assisted living facility. She shared that her mother did open up to a discussion about it. Clinician asked open ended questions about the pros and cons of moving to a facility. Natasha [name] shared that she believed life would be improved for her and her mother if they were in assisted living. Natasha [name] shared that she feels very responsible for her mother but has been experiencing a lot of stress because of her own physical limitations. Clinician asked open ended questions about possible resources available that she would be willing to use. Client and clinician discussed options to improve her mood during the transition. Natasha [name] denied any current suicidal or homicidal ideation  Plan: Return again in 1 weeks.  Diagnosis: Axis I: Major Depressive Disorder, recurrent, severe without psychotic features and PTSD    Adama Ivins A,  LCSW 05/03/2017

## 2017-05-16 DIAGNOSIS — F332 Major depressive disorder, recurrent severe without psychotic features: Secondary | ICD-10-CM | POA: Diagnosis not present

## 2017-05-16 DIAGNOSIS — F329 Major depressive disorder, single episode, unspecified: Secondary | ICD-10-CM | POA: Diagnosis not present

## 2017-05-16 DIAGNOSIS — F3289 Other specified depressive episodes: Secondary | ICD-10-CM | POA: Diagnosis not present

## 2017-06-01 ENCOUNTER — Ambulatory Visit (HOSPITAL_COMMUNITY): Payer: Self-pay | Admitting: Clinical

## 2017-06-16 ENCOUNTER — Ambulatory Visit (HOSPITAL_COMMUNITY): Payer: Self-pay | Admitting: Clinical

## 2017-06-22 ENCOUNTER — Ambulatory Visit (HOSPITAL_COMMUNITY): Payer: Self-pay | Admitting: Clinical

## 2017-06-23 ENCOUNTER — Ambulatory Visit (HOSPITAL_COMMUNITY): Payer: Self-pay | Admitting: Psychiatry

## 2017-06-29 ENCOUNTER — Ambulatory Visit (HOSPITAL_COMMUNITY): Payer: Self-pay | Admitting: Clinical

## 2017-07-13 ENCOUNTER — Ambulatory Visit (HOSPITAL_COMMUNITY): Payer: Self-pay | Admitting: Clinical

## 2017-07-27 ENCOUNTER — Ambulatory Visit (HOSPITAL_COMMUNITY): Payer: Self-pay | Admitting: Clinical

## 2017-07-31 ENCOUNTER — Emergency Department (HOSPITAL_COMMUNITY)
Admission: EM | Admit: 2017-07-31 | Discharge: 2017-07-31 | Disposition: A | Discharge status: Other Acute Inpatient Hospital | Payer: Medicare Other | Attending: Emergency Medicine | Admitting: Emergency Medicine

## 2017-07-31 ENCOUNTER — Encounter (HOSPITAL_COMMUNITY): Payer: Self-pay | Admitting: Cardiology

## 2017-07-31 DIAGNOSIS — R45851 Suicidal ideations: Secondary | ICD-10-CM | POA: Diagnosis not present

## 2017-07-31 DIAGNOSIS — I482 Chronic atrial fibrillation: Secondary | ICD-10-CM | POA: Diagnosis not present

## 2017-07-31 DIAGNOSIS — Z9114 Patient's other noncompliance with medication regimen: Secondary | ICD-10-CM | POA: Insufficient documentation

## 2017-07-31 DIAGNOSIS — F329 Major depressive disorder, single episode, unspecified: Secondary | ICD-10-CM | POA: Insufficient documentation

## 2017-07-31 DIAGNOSIS — Z046 Encounter for general psychiatric examination, requested by authority: Secondary | ICD-10-CM | POA: Insufficient documentation

## 2017-07-31 DIAGNOSIS — Z79899 Other long term (current) drug therapy: Secondary | ICD-10-CM | POA: Diagnosis not present

## 2017-07-31 DIAGNOSIS — F411 Generalized anxiety disorder: Secondary | ICD-10-CM | POA: Diagnosis present

## 2017-07-31 DIAGNOSIS — F32A Depression, unspecified: Secondary | ICD-10-CM

## 2017-07-31 DIAGNOSIS — F332 Major depressive disorder, recurrent severe without psychotic features: Secondary | ICD-10-CM | POA: Diagnosis not present

## 2017-07-31 DIAGNOSIS — F3010 Manic episode without psychotic symptoms, unspecified: Secondary | ICD-10-CM | POA: Diagnosis not present

## 2017-07-31 DIAGNOSIS — R45850 Homicidal ideations: Secondary | ICD-10-CM | POA: Diagnosis present

## 2017-07-31 DIAGNOSIS — K219 Gastro-esophageal reflux disease without esophagitis: Secondary | ICD-10-CM | POA: Diagnosis present

## 2017-07-31 LAB — BASIC METABOLIC PANEL
Anion gap: 8 (ref 5–15)
BUN: 11 mg/dL (ref 6–20)
CO2: 23 mmol/L (ref 22–32)
Calcium: 9.2 mg/dL (ref 8.9–10.3)
Chloride: 105 mmol/L (ref 101–111)
Creatinine, Ser: 0.64 mg/dL (ref 0.44–1.00)
GFR calc Af Amer: >60 mL/min (ref >60)
GFR calc non Af Amer: >60 mL/min (ref >60)
Glucose, Bld: 91 mg/dL (ref 65–99)
Potassium: 3.9 mmol/L (ref 3.5–5.1)
Sodium: 136 mmol/L (ref 135–145)

## 2017-07-31 LAB — CBC WITH DIFFERENTIAL/PLATELET
Basophils Absolute: 0 10*3/uL (ref 0.0–0.1)
Basophils Relative: 0 %
Eosinophils Absolute: 0 10*3/uL (ref 0.0–0.7)
Eosinophils Relative: 0 %
HCT: 43.1 % (ref 36.0–46.0)
Hemoglobin: 14.6 g/dL (ref 12.0–15.0)
Lymphocytes Relative: 18 %
Lymphs Abs: 1.5 10*3/uL (ref 0.7–4.0)
MCH: 28.7 pg (ref 26.0–34.0)
MCHC: 33.9 g/dL (ref 30.0–36.0)
MCV: 84.8 fL (ref 78.0–100.0)
Monocytes Absolute: 0.5 10*3/uL (ref 0.1–1.0)
Monocytes Relative: 6 %
Neutro Abs: 6.3 10*3/uL (ref 1.7–7.7)
Neutrophils Relative %: 76 %
Platelets: 257 10*3/uL (ref 150–400)
RBC: 5.08 MIL/uL (ref 3.87–5.11)
RDW: 13.4 % (ref 11.5–15.5)
WBC: 8.3 10*3/uL (ref 4.0–10.5)

## 2017-07-31 LAB — ETHANOL: Alcohol, Ethyl (B): <5 mg/dL (ref <5)

## 2017-07-31 MED ORDER — LORAZEPAM 1 MG PO TABS
1.0000 mg | ORAL_TABLET | Freq: Two times a day (BID) | ORAL | Status: DC | PRN
Start: 2017-07-31 — End: 2017-07-31
  Administered 2017-07-31: 1 mg via ORAL
  Filled 2017-07-31: qty 1

## 2017-07-31 NOTE — ED Notes (Signed)
Pt reports she feels very anxious. MD notified.

## 2017-07-31 NOTE — Progress Notes (Signed)
Patient is accepted to inpatient treatment program at [facility], to [facility] building, to Dr. Robet Leu, bed is available now. Per [name] at [facility], AP-ED RN has been given accepting information and nursing report number.  Melbourne Abts, MSW, LCSWA Clinical social worker in disposition [facility], TTS Office [phone] and [phone] 07/31/2017 4:25 PM

## 2017-07-31 NOTE — Progress Notes (Signed)
Natasha [name] inquired if patient still needed placement. Writer informed that patient still does. Intake Natasha [name] from OV requested to speak with patient's nurse. Requested contact provided, AP-ED nurse's station [phone]. This Clinical research associate awaiting call back from OV regarding potential admission.  Natasha [name], MSW, LCSWA Clinical social worker in disposition [facility], TTS Office [phone] and [phone] 07/31/2017 4:21 PM

## 2017-07-31 NOTE — ED Notes (Signed)
Patient is resting comfortably. 

## 2017-07-31 NOTE — ED Notes (Signed)
Pt unsucsessfully attempted to collect urine sample. Will try again later.

## 2017-07-31 NOTE — ED Triage Notes (Signed)
Here by EMS for depression and SI.   Prior suicide attempts.   Pt is mainly  wheelchair bound for spinal cord injury.  Pt can ambulate some.  Pt lives with Mom.   Pt is responsible to take care of Mom.   Pt is feeling stress from having to take care of Mom.   In the process of getting assisted living for Mom.

## 2017-07-31 NOTE — ED Provider Notes (Addendum)
Pt has been accepted by Dr. Almyra Brace at [facility] completed   Eber Hong, MD 07/31/17 1627    Eber Hong, MD 07/31/17 [phone]

## 2017-07-31 NOTE — Progress Notes (Addendum)
Patient was recommended inpatient psychiatric treatment by Hillery Jacks NP on 07/31/17.  Case has been staffed with [facility] team and there are no appropriate beds at this moment.  Patient will be referred for IP treatment at the following facilities that accept referrals or have beds today: [facility], [place], [place], [facility] [place], [place], [facility] [place], and [place].  This writer is awaiting complete drug screen so that referral to treatment can be done. [facility]-ED RN Mindy notified.  Natasha [name], MSW, [employer] Clinical social worker in disposition [facility], TTS Office [phone] and [phone] 07/31/2017 1:40 PM

## 2017-07-31 NOTE — ED Notes (Signed)
Tele-psych assessment ongoing at this time

## 2017-07-31 NOTE — Progress Notes (Signed)
Patient was accepted to [facility]. [facility]-ED RN Mindy aware.  Melbourne Abts, MSW, LCSWA Clinical social worker in disposition [facility], TTS Office [phone] and [phone] 07/31/2017 4:46 PM
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
## 2017-07-31 NOTE — Progress Notes (Signed)
Patient has been referred for IP treatment at the following facilities that accept referrals or have beds today: [facility], [place], [place], [facility] [place], [place], [facility], and [place].  At capacity: [facility] [address], [place], [place], [place], [address] and [place].  CSW in disposition will continue to seek placement.  Melbourne Abts, MSW, LCSWA Clinical social worker in disposition [facility], TTS Office [phone] and [phone] 07/31/2017 2:16 PM

## 2017-07-31 NOTE — ED Provider Notes (Signed)
[facility]-EMERGENCY DEPT Provider Note   CSN: [id number] Arrival date & time: 07/31/17  1009     History   Chief Complaint Chief Complaint  Patient presents with  . V70.1    HPI Natasha [name] is a 46 y.o. female.  Patient is depressed and suicidal secondary to her home situation. Patient is wheelchair bound secondary to spinal cord injury from a failed suicide (attempted hanging). She is responsible for taking care of her mother with Parkinson's disease. Patient stopped all her medications 2 months ago. Severity of symptoms is moderate to severe.      Past Medical History:  Diagnosis Date  . C6 spinal cord injury (HCC)   . Depression     Patient Active Problem List   Diagnosis Date Noted  . MDD (major depressive disorder), recurrent severe, without psychosis (HCC) 02/02/2017  . MDD (major depressive disorder) 10/02/2015  . Severe recurrent major depression without psychotic features (HCC) 06/19/2015    Past Surgical History:  Procedure Laterality Date  . BACK SURGERY      OB History    No data available       Home Medications    Prior to Admission medications   Medication Sig Start Date End Date Taking? Authorizing Provider  ARIPiprazole (ABILIFY) 5 MG tablet Take 1 tablet (5 mg total) by mouth at bedtime. For mood control 03/24/17   Arfeen, Phillips Grout, MD  clonazePAM (KLONOPIN) 0.5 MG tablet Take 1 tablet (0.5 mg total) by mouth 3 (three) times daily as needed for anxiety. 03/24/17   Arfeen, Phillips Grout, MD  escitalopram (LEXAPRO) 20 MG tablet Take 1 tablet (20 mg total) by mouth daily. For depression 03/24/17   Arfeen, Phillips Grout, MD  lithium carbonate 300 MG capsule Take 1 tablet (300 mg) in the morning & 2 tablets (600 mg) at bedtime: For mood stabilization 03/24/17   Arfeen, Phillips Grout, MD    Family History Family History  Problem Relation Age of Onset  . Alcohol abuse Father   . Alcohol abuse Brother   . Depression Mother   . Alcohol abuse Mother     Social  History Social History  Substance Use Topics  . Smoking status: Never Smoker  . Smokeless tobacco: Never Used     Comment: No smoking hx; no need for cessation materials  . Alcohol use No     Comment: Occasional use     Allergies   Patient has no known allergies.   Review of Systems Review of Systems  All other systems reviewed and are negative.    Physical Exam Updated Vital Signs BP 128/78 (BP Location: Left Arm)   Pulse (!) 130   Resp (!) 25   Ht 5' (1.524 m)   Wt 60.3 kg (133 lb)   SpO2 100%   BMI 25.97 kg/m   Physical Exam  Constitutional: She is oriented to person, place, and time. She appears well-developed and well-nourished.  HENT:  Head: Normocephalic and atraumatic.  Eyes: Conjunctivae are normal.  Neck: Neck supple.  Cardiovascular: Normal rate and regular rhythm.   Pulmonary/Chest: Effort normal and breath sounds normal.  Abdominal: Soft. Bowel sounds are normal.  Musculoskeletal: Normal range of motion.  Neurological: She is alert and oriented to person, place, and time.  Patient is wheelchair-bound, but is able to use her arms.  Skin: Skin is warm and dry.  Psychiatric:  Flat affect, depresssed  Nursing note and vitals reviewed.    ED Treatments / Results  Labs (  all labs ordered are listed, but only abnormal results are displayed) Labs Reviewed  CBC WITH DIFFERENTIAL/PLATELET  BASIC METABOLIC PANEL  ETHANOL  RAPID URINE DRUG SCREEN, HOSP PERFORMED    EKG  EKG Interpretation None       Radiology No results found.  Procedures Procedures (including critical care time)  Medications Ordered in ED Medications - No data to display   Initial Impression / Assessment and Plan / ED Course  I have reviewed the triage vital signs and the nursing notes.  Pertinent labs & imaging results that were available during my care of the patient were reviewed by me and considered in my medical decision making (see chart for details).      Patient is depressed and suicidal. Will obtain behavioral health consult.  Final Clinical Impressions(s) / ED Diagnoses   Final diagnoses:  Depression, unspecified depression type  Suicidal ideation    New Prescriptions New Prescriptions   No medications on file     Donnetta Hutching, MD 07/31/17 1108

## 2017-07-31 NOTE — ED Triage Notes (Signed)
Pt also states that she stopped all her medicines and therapy 2 months ago.  Stopped Lithium,  Wellbutrin,  Clonopin , lexapro and ECT.

## 2017-07-31 NOTE — ED Notes (Signed)
Pt given hat in toilet to get UA, pt urinated outside of the hat, unable to collect UA at this time

## 2017-08-10 ENCOUNTER — Ambulatory Visit (HOSPITAL_COMMUNITY): Payer: Self-pay | Admitting: Clinical

## 2017-08-12 DIAGNOSIS — F411 Generalized anxiety disorder: Secondary | ICD-10-CM | POA: Diagnosis not present

## 2017-08-12 DIAGNOSIS — R531 Weakness: Secondary | ICD-10-CM | POA: Diagnosis not present

## 2017-08-12 DIAGNOSIS — S14109S Unspecified injury at unspecified level of cervical spinal cord, sequela: Secondary | ICD-10-CM | POA: Diagnosis not present

## 2017-08-12 DIAGNOSIS — F332 Major depressive disorder, recurrent severe without psychotic features: Secondary | ICD-10-CM | POA: Diagnosis not present

## 2017-08-13 DIAGNOSIS — S14109S Unspecified injury at unspecified level of cervical spinal cord, sequela: Secondary | ICD-10-CM | POA: Diagnosis not present

## 2017-08-13 DIAGNOSIS — F332 Major depressive disorder, recurrent severe without psychotic features: Secondary | ICD-10-CM | POA: Diagnosis not present

## 2017-08-13 DIAGNOSIS — R531 Weakness: Secondary | ICD-10-CM | POA: Diagnosis not present

## 2017-08-13 DIAGNOSIS — F411 Generalized anxiety disorder: Secondary | ICD-10-CM | POA: Diagnosis not present

## 2017-08-15 ENCOUNTER — Emergency Department (HOSPITAL_COMMUNITY)
Admission: EM | Admit: 2017-08-15 | Discharge: 2017-08-15 | Disposition: A | Discharge status: Other Acute Inpatient Hospital | Payer: Medicare Other | Attending: Emergency Medicine | Admitting: Emergency Medicine

## 2017-08-15 ENCOUNTER — Encounter (HOSPITAL_COMMUNITY): Payer: Self-pay | Admitting: Emergency Medicine

## 2017-08-15 DIAGNOSIS — F419 Anxiety disorder, unspecified: Secondary | ICD-10-CM | POA: Diagnosis present

## 2017-08-15 DIAGNOSIS — Z046 Encounter for general psychiatric examination, requested by authority: Secondary | ICD-10-CM | POA: Diagnosis not present

## 2017-08-15 DIAGNOSIS — F332 Major depressive disorder, recurrent severe without psychotic features: Secondary | ICD-10-CM | POA: Insufficient documentation

## 2017-08-15 DIAGNOSIS — F29 Unspecified psychosis not due to a substance or known physiological condition: Secondary | ICD-10-CM | POA: Diagnosis not present

## 2017-08-15 DIAGNOSIS — M79671 Pain in right foot: Secondary | ICD-10-CM | POA: Diagnosis not present

## 2017-08-15 DIAGNOSIS — R0789 Other chest pain: Secondary | ICD-10-CM | POA: Insufficient documentation

## 2017-08-15 DIAGNOSIS — Z79899 Other long term (current) drug therapy: Secondary | ICD-10-CM | POA: Diagnosis not present

## 2017-08-15 DIAGNOSIS — R45851 Suicidal ideations: Secondary | ICD-10-CM

## 2017-08-15 LAB — COMPREHENSIVE METABOLIC PANEL
ALT: 20 U/L (ref 14–54)
AST: 16 U/L (ref 15–41)
Albumin: 3.6 g/dL (ref 3.5–5.0)
Alkaline Phosphatase: 67 U/L (ref 38–126)
Anion gap: 8 (ref 5–15)
BUN: 14 mg/dL (ref 6–20)
CO2: 26 mmol/L (ref 22–32)
Calcium: 9.1 mg/dL (ref 8.9–10.3)
Chloride: 104 mmol/L (ref 101–111)
Creatinine, Ser: 0.69 mg/dL (ref 0.44–1.00)
GFR calc Af Amer: >60 mL/min (ref >60)
GFR calc non Af Amer: >60 mL/min (ref >60)
Glucose, Bld: 90 mg/dL (ref 65–99)
Potassium: 4.1 mmol/L (ref 3.5–5.1)
Sodium: 138 mmol/L (ref 135–145)
Total Bilirubin: 0.7 mg/dL (ref 0.3–1.2)
Total Protein: 6.2 g/dL — ABNORMAL LOW (ref 6.5–8.1)

## 2017-08-15 LAB — I-STAT BETA HCG BLOOD, ED (MC, WL, AP ONLY): I-stat hCG, quantitative: <5 m[IU]/mL (ref <5)

## 2017-08-15 LAB — RAPID URINE DRUG SCREEN, HOSP PERFORMED
Amphetamines: NOT DETECTED
Barbiturates: NOT DETECTED
Benzodiazepines: NOT DETECTED
Cocaine: NOT DETECTED
Opiates: NOT DETECTED
Tetrahydrocannabinol: NOT DETECTED

## 2017-08-15 LAB — CBC WITH DIFFERENTIAL/PLATELET
Basophils Absolute: 0 10*3/uL (ref 0.0–0.1)
Basophils Relative: 0 %
Eosinophils Absolute: 0 10*3/uL (ref 0.0–0.7)
Eosinophils Relative: 0 %
HCT: 42.2 % (ref 36.0–46.0)
Hemoglobin: 14 g/dL (ref 12.0–15.0)
Lymphocytes Relative: 15 %
Lymphs Abs: 1.5 10*3/uL (ref 0.7–4.0)
MCH: 28.6 pg (ref 26.0–34.0)
MCHC: 33.2 g/dL (ref 30.0–36.0)
MCV: 86.1 fL (ref 78.0–100.0)
Monocytes Absolute: 0.5 10*3/uL (ref 0.1–1.0)
Monocytes Relative: 5 %
Neutro Abs: 8.2 10*3/uL — ABNORMAL HIGH (ref 1.7–7.7)
Neutrophils Relative %: 80 %
Platelets: 240 10*3/uL (ref 150–400)
RBC: 4.9 MIL/uL (ref 3.87–5.11)
RDW: 13.2 % (ref 11.5–15.5)
WBC: 10.2 10*3/uL (ref 4.0–10.5)

## 2017-08-15 LAB — ETHANOL: Alcohol, Ethyl (B): <5 mg/dL (ref <5)

## 2017-08-15 LAB — TROPONIN I: Troponin I: <0.03 ng/mL (ref <0.03)

## 2017-08-15 LAB — ACETAMINOPHEN LEVEL: Acetaminophen (Tylenol), Serum: <10 ug/mL — ABNORMAL LOW (ref 10–30)

## 2017-08-15 LAB — SALICYLATE LEVEL: Salicylate Lvl: <7 mg/dL (ref 2.8–30.0)

## 2017-08-15 MED ORDER — ALUM & MAG HYDROXIDE-SIMETH 200-200-20 MG/5ML PO SUSP: 30.0000 mL | Freq: Four times a day (QID) | ORAL | Status: DC | PRN

## 2017-08-15 MED ORDER — ONDANSETRON HCL 4 MG PO TABS: 4.0000 mg | ORAL_TABLET | Freq: Three times a day (TID) | ORAL | Status: DC | PRN

## 2017-08-15 MED ORDER — ESCITALOPRAM OXALATE 10 MG PO TABS
20.0000 mg | ORAL_TABLET | Freq: Every day | ORAL | Status: DC
  Administered 2017-08-15: 20 mg via ORAL
  Filled 2017-08-15 (×3): qty 2

## 2017-08-15 MED ORDER — TRAZODONE HCL 50 MG PO TABS: 100.0000 mg | ORAL_TABLET | Freq: Every evening | ORAL | Status: DC | PRN

## 2017-08-15 MED ORDER — ACETAMINOPHEN 500 MG PO TABS
1000.0000 mg | ORAL_TABLET | Freq: Once | ORAL | Status: AC
  Administered 2017-08-15: 1000 mg via ORAL
  Filled 2017-08-15: qty 2

## 2017-08-15 MED ORDER — CLONAZEPAM 0.5 MG PO TABS: 0.5000 mg | ORAL_TABLET | Freq: Three times a day (TID) | ORAL | Status: DC | PRN

## 2017-08-15 MED ORDER — BUSPIRONE HCL 15 MG PO TABS
30.0000 mg | ORAL_TABLET | Freq: Three times a day (TID) | ORAL | Status: DC
  Administered 2017-08-15 (×2): 30 mg via ORAL
  Filled 2017-08-15 (×6): qty 2

## 2017-08-15 NOTE — ED Notes (Signed)
Awaiting transport.

## 2017-08-15 NOTE — ED Provider Notes (Signed)
[facility]-EM[facility]Y DEPT Provider Note   CSN: [id number] Arrival date & time: 08/15/17  1011     History   Chief Complaint Chief Complaint  Patient presents with  . Suicidal    HPI Natasha [name] is a 46 y.o. female presenting with worsening depression and suicidal ideation with thoughts of overdosing on her prescription medications.  She was just discharged from [facility] [place] 4 days ago for depression and suicidal ideation.  She returned to her home where she helps care for her mother who has Parkinson's disease.  There was an additional caregiver in the home for this weekend which patient stated she thought this would help ease her transition but the stress of being there has worsened her suicidality.  She does endorse increased anxiety and wakes with chest tightness most mornings including yesterday and today.  She denies shortness of breath, palpitations, wheezing.  The pressure persists while here.  Physically denies any other symptoms at this time.  She is predominantly wheelchair bound secondary to spinal cord injury from an unsuccessful hanging suicide attempt.  She can ambulate for short distances using a walker.  The history is provided by the patient.    Past Medical History:  Diagnosis Date  . C6 spinal cord injury (HCC)   . Depression     Patient Active Problem List   Diagnosis Date Noted  . MDD (major depressive disorder), recurrent severe, without psychosis (HCC) 02/02/2017  . MDD (major depressive disorder) 10/02/2015  . Severe recurrent major depression without psychotic features (HCC) 06/19/2015    Past Surgical History:  Procedure Laterality Date  . BACK SURGERY      OB History    No data available       Home Medications    Prior to Admission medications   Medication Sig Start Date End Date Taking? Authorizing Provider  busPIRone (BUSPAR) 15 MG tablet Take 2 tablets by mouth 3 (three) times daily. 08/11/17  Yes [provider]  clonazePAM  (KLONOPIN) 0.5 MG tablet Take 1 tablet (0.5 mg total) by mouth 3 (three) times daily as needed for anxiety. 03/24/17  Yes Arfeen, Phillips Grout, MD  escitalopram (LEXAPRO) 20 MG tablet Take 1 tablet (20 mg total) by mouth daily. For depression 03/24/17  Yes Arfeen, Phillips Grout, MD  traZODone (DESYREL) 100 MG tablet Take 1 tablet by mouth at bedtime as needed. 08/11/17  Yes [provider]  ARIPiprazole (ABILIFY) 5 MG tablet Take 1 tablet (5 mg total) by mouth at bedtime. For mood control 03/24/17   Arfeen, Phillips Grout, MD  lithium carbonate 300 MG capsule Take 1 tablet (300 mg) in the morning & 2 tablets (600 mg) at bedtime: For mood stabilization 03/24/17   Arfeen, Phillips Grout, MD    Family History Family History  Problem Relation Age of Onset  . Alcohol abuse Father   . Alcohol abuse Brother   . Depression Mother   . Alcohol abuse Mother     Social History Social History  Substance Use Topics  . Smoking status: Never Smoker  . Smokeless tobacco: Never Used     Comment: No smoking hx; no need for cessation materials  . Alcohol use No     Comment: Occasional use     Allergies   Patient has no known allergies.   Review of Systems Review of Systems  Constitutional: Negative for fever.  HENT: Negative for congestion.   Eyes: Negative.   Respiratory: Positive for chest tightness. Negative for shortness of breath.  Cardiovascular: Negative for chest pain.  Gastrointestinal: Negative for abdominal pain, nausea and vomiting.  Genitourinary: Negative.   Musculoskeletal: Negative for arthralgias, joint swelling and neck pain.  Skin: Negative.  Negative for rash and wound.  Neurological: Negative for dizziness, weakness, light-headedness, numbness and headaches.  Psychiatric/Behavioral: Positive for suicidal ideas. Negative for hallucinations. The patient is nervous/anxious.      Physical Exam Updated Vital Signs BP 113/77 (BP Location: Left Arm)   Pulse (!) 59   Temp 98.3 F (36.8 C) (Oral)    Resp 16   Ht 5' (1.524 m)   Wt 56.7 kg (125 lb)   LMP 08/10/2017   SpO2 96%   BMI 24.41 kg/m   Physical Exam  Constitutional: She is oriented to person, place, and time. She appears well-developed and well-nourished.  Tearful  HENT:  Head: Normocephalic and atraumatic.  Eyes: Conjunctivae are normal.  Neck: Normal range of motion.  Cardiovascular: Normal rate, regular rhythm, normal heart sounds and intact distal pulses.   Pulmonary/Chest: Effort normal and breath sounds normal. She has no wheezes. She exhibits no tenderness.  Abdominal: Soft. Bowel sounds are normal. She exhibits no distension. There is no tenderness.  Musculoskeletal: Normal range of motion.  Neurological: She is alert and oriented to person, place, and time.  Skin: Skin is warm and dry.  Psychiatric: Her speech is normal. She exhibits a depressed mood.  Nursing note and vitals reviewed.    ED Treatments / Results  Labs (all labs ordered are listed, but only abnormal results are displayed) Labs Reviewed  COMPREHENSIVE METABOLIC PANEL - Abnormal; Notable for the following:       Result Value   Total Protein 6.2 (*)    All other components within normal limits  CBC WITH DIFFERENTIAL/PLATELET - Abnormal; Notable for the following:    Neutro Abs 8.2 (*)    All other components within normal limits  ACETAMINOPHEN LEVEL - Abnormal; Notable for the following:    Acetaminophen (Tylenol), Serum <10 (*)    All other components within normal limits  RAPID URINE DRUG SCREEN, HOSP PERFORMED  ETHANOL  SALICYLATE LEVEL  TROPONIN I  I-STAT BETA HCG BLOOD, ED ([facility], [facility], [facility] ONLY)    EKG  EKG Interpretation None       Radiology No results found.  Procedures Procedures (including critical care time)  Medications Ordered in ED Medications  ondansetron (ZOFRAN) tablet 4 mg (not administered)  alum & mag hydroxide-simeth (MAALOX/MYLANTA) 200-200-20 MG/5ML suspension 30 mL (not administered)  clonazePAM  (KLONOPIN) tablet 0.5 mg (not administered)  escitalopram (LEXAPRO) tablet 20 mg (not administered)  traZODone (DESYREL) tablet 100 mg (not administered)  busPIRone (BUSPAR) tablet 30 mg (not administered)     Initial Impression / Assessment and Plan / ED Course  I have reviewed the triage vital signs and the nursing notes.  Pertinent labs & imaging results that were available during my care of the patient were reviewed by me and considered in my medical decision making (see chart for details).     Pt here voluntarily for suicidal ideation without attempt. Chest pressure appears anxiety related, normal ekg, chest pressure qam including ytd and today, negative trop.  Cleared from medical standpoint.  TTS requested.  Psych holding orders placed including home meds.  Discussed pt's reported dosing of buspar with pharmacy which is a high dose, but is safe for severe depression. Pending tts consult.  Final Clinical Impressions(s) / ED Diagnoses   Final diagnoses:  Severe episode of  recurrent major depressive disorder, without psychotic features [facility](HCC)  Suicidal ideation    New Prescriptions New Prescriptions   No medications on file     Victoriano Laindol, Antoninette Lerner, PA-C 08/15/17 1419    Loren RacerYelverton, David, MD 08/15/17 [phone]

## 2017-08-15 NOTE — Progress Notes (Signed)
Patient accepted to [facility].  Dr. Izola PriceMyers is the accepting provider.  Dr. Laveda Abbehomas Sneed is the attending provider.  Call report to [phone]  Patient, who is voluntary was initially reluctant to accept bed offer.  CSW requested to speak to pt to present options and discuss pt's reluctance to go, as she is requesting treatment.  CSW spoke with pt directly and explained that [facility] had declined the referral.  CSW asked about pt's reluctance and pt related that she felt she needed to "be close to family.  They may need me because there are big changes going on in my family."  CSW acknowledged awareness that pt's family is trying to arrange an ALF admission for pt's mother, who she cares for at home.  CSW encouraged pt to consider the bed offer as thus far there were no other offers and any other offers would be for treatment beds further away than [facility].  Patient agreed to accept [facility] bed offer and will transfer tonight.    [facility] ED Nurse, Dewayne HatchAnn, notified.  Timmothy EulerJean T. Kaylyn LimSutter, MSW, LCSWA Disposition Clinical Social Work [phone] (cell) [phone] (office)

## 2017-08-15 NOTE — ED Notes (Signed)
Pt reports that she has stress at home and feels that things may be improving as her brother has suggested and is willing to help with having her mother move to assisted living.   She feels that her meds are not yet working and states that it has only been 10 days since she began the medication  She complains of a headache

## 2017-08-15 NOTE — Progress Notes (Signed)
CSW reviewed pt chart. Per Fransisca KaufmannLaura Davis, NP, pt meets criteria for inpatient hospitalization.  Pt referral packet sent to the following hospitals: [facility],  [facility],  [facility],  [facility].  [address],  [facility],  [facility]  Disposition:  CSW will continue to follow for placement.  Timmothy EulerJean T. Kaylyn LimSutter, MSW, LCSWA Disposition Clinical Social Work [phone] (cell) [phone] (office)

## 2017-08-15 NOTE — ED Notes (Signed)
Assisted pt to bathroom in a wheelchair

## 2017-08-15 NOTE — ED Provider Notes (Signed)
  Physical Exam  BP 113/65 (BP Location: Left Arm)   Pulse 64   Temp 98.3 F (36.8 C) (Oral)   Resp 18   Ht 5' (1.524 m)   Wt 56.7 kg (125 lb)   LMP 08/10/2017   SpO2 100%   BMI 24.41 kg/m   Physical Exam  ED Course  Procedures  MDM  Patient appears stable for transfer      Benjiman CorePickering, Neomia Herbel, MD 08/15/17 2200

## 2017-08-15 NOTE — ED Notes (Signed)
Call to [facility] P[facility] tosin to inform pt does ADLs per pt [phone]

## 2017-08-15 NOTE — ED Notes (Addendum)
Call to [facility];  Natasha [name]   PT declines to go to [facility] stating that she thought she would be [facility] She reports that she has transportation issues and asking what few friends or family to go and pick her up there would be prohibitive It is asked to tell pt that [facility] is a new facility and has been given positive reviews and there are other ways to get her back from there

## 2017-08-15 NOTE — ED Notes (Signed)
Pt on phone speaking with brother speaking of leaving

## 2017-08-15 NOTE — Progress Notes (Signed)
CSW contacted by [facility] ED Nurse, Dewayne HatchAnn, who reports that pt is aware that [facility] might be able to offer a bed.  Nurse reports that pt does not want to go to that hospital because she feels it's too far and she would not have any way to return home.  Pt. Requested that she [facility] as she just d/c from there 3 days ago.  CSW contacted [facility] and spoke with French Anaracy in the intake department.  French Anaracy informed this Clinical research associatewriter that they would have to review pt's request with the discharging physician and would call this writer back.  French Anaracy from [facility] called and related that the physician who had d/c'd patient 3 days ago was declining to admit the patient again and suggested that a referral to [facility] be made.    Referrals have been sent to other hospitals.  Timmothy EulerJean T. Kaylyn LimSutter, MSW, LCSWA Disposition Clinical Social Work [phone] ([phone] (office)

## 2017-08-15 NOTE — ED Triage Notes (Signed)
Pt c/o continued depression and SI with a plan to overdose. Pt d[facility]c [facility] Thursday for same.

## 2017-08-15 NOTE — ED Notes (Signed)
Pt given a snack per RN, Prescott ParmaAnne Tuttle. Pt is sitting up on side of bed eating snack independently.

## 2017-08-15 NOTE — ED Notes (Signed)
Dr Levan HurstPolenta in to reassess
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
## 2017-08-15 NOTE — ED Provider Notes (Signed)
Patient rechecked prior to transfer. Patient reports that she is doing well without complaints. She was found resting comfortably in the room. She is stable for transfer.  Vitals:   08/15/17 1929 08/15/17 2313  BP: 113/65 102/66  Pulse: 64 78  Resp: 18 16  Temp:    SpO2: 100% 99%      Gilda CreasePollina, Sharnelle Cappelli J, MD 08/15/17 2324

## 2017-08-16 DIAGNOSIS — F419 Anxiety disorder, unspecified: Secondary | ICD-10-CM | POA: Diagnosis not present

## 2017-08-16 DIAGNOSIS — Z62811 Personal history of psychological abuse in childhood: Secondary | ICD-10-CM | POA: Diagnosis present

## 2017-08-16 DIAGNOSIS — G4089 Other seizures: Secondary | ICD-10-CM | POA: Diagnosis not present

## 2017-08-16 DIAGNOSIS — S14109S Unspecified injury at unspecified level of cervical spinal cord, sequela: Secondary | ICD-10-CM | POA: Diagnosis not present

## 2017-08-16 DIAGNOSIS — R45851 Suicidal ideations: Secondary | ICD-10-CM | POA: Diagnosis present

## 2017-08-16 DIAGNOSIS — Z915 Personal history of self-harm: Secondary | ICD-10-CM | POA: Diagnosis not present

## 2017-08-16 DIAGNOSIS — F332 Major depressive disorder, recurrent severe without psychotic features: Secondary | ICD-10-CM | POA: Diagnosis not present

## 2017-08-29 DIAGNOSIS — F319 Bipolar disorder, unspecified: Secondary | ICD-10-CM | POA: Diagnosis not present

## 2017-08-29 DIAGNOSIS — F332 Major depressive disorder, recurrent severe without psychotic features: Secondary | ICD-10-CM | POA: Diagnosis not present

## 2017-08-31 DIAGNOSIS — F332 Major depressive disorder, recurrent severe without psychotic features: Secondary | ICD-10-CM | POA: Diagnosis not present

## 2017-08-31 DIAGNOSIS — F329 Major depressive disorder, single episode, unspecified: Secondary | ICD-10-CM | POA: Diagnosis not present

## 2017-09-07 DIAGNOSIS — F332 Major depressive disorder, recurrent severe without psychotic features: Secondary | ICD-10-CM | POA: Diagnosis not present

## 2017-09-07 DIAGNOSIS — F329 Major depressive disorder, single episode, unspecified: Secondary | ICD-10-CM | POA: Diagnosis not present
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
## 2017-09-13 ENCOUNTER — Encounter (HOSPITAL_COMMUNITY): Payer: Self-pay | Admitting: Psychiatry

## 2017-09-13 ENCOUNTER — Ambulatory Visit (INDEPENDENT_AMBULATORY_CARE_PROVIDER_SITE_OTHER): Payer: Medicare Other | Admitting: Psychiatry

## 2017-09-13 ENCOUNTER — Encounter (INDEPENDENT_AMBULATORY_CARE_PROVIDER_SITE_OTHER): Payer: Self-pay

## 2017-09-13 VITALS — BP 116/77 | HR 62 | Ht 60.0 in | Wt 116.4 lb

## 2017-09-13 DIAGNOSIS — F332 Major depressive disorder, recurrent severe without psychotic features: Secondary | ICD-10-CM | POA: Diagnosis not present

## 2017-09-13 DIAGNOSIS — G4700 Insomnia, unspecified: Secondary | ICD-10-CM | POA: Diagnosis not present

## 2017-09-13 DIAGNOSIS — Z79899 Other long term (current) drug therapy: Secondary | ICD-10-CM | POA: Diagnosis not present

## 2017-09-13 DIAGNOSIS — F419 Anxiety disorder, unspecified: Secondary | ICD-10-CM | POA: Diagnosis not present

## 2017-09-13 DIAGNOSIS — R45851 Suicidal ideations: Secondary | ICD-10-CM | POA: Diagnosis not present

## 2017-09-13 DIAGNOSIS — Z818 Family history of other mental and behavioral disorders: Secondary | ICD-10-CM | POA: Diagnosis not present

## 2017-09-13 DIAGNOSIS — Z915 Personal history of self-harm: Secondary | ICD-10-CM

## 2017-09-13 NOTE — Patient Instructions (Addendum)
1. Continue lexapro 30 mg daily 2. Continue buspar 10 mg three times a day 3. Continue quetiapine 25 mg twice a day 4. Continue Trazodone 100 mg at night  5. Try to have some daily routine 6. Try church if you are interested 7. Referral to therapy 8. Return to clinic in one month for 30 mins 9. If you have worsening suicidal thought, contact  911, ED or suicide crisis line [phone]) for help

## 2017-09-14 DIAGNOSIS — Z79891 Long term (current) use of opiate analgesic: Secondary | ICD-10-CM | POA: Diagnosis not present

## 2017-09-14 DIAGNOSIS — Z79899 Other long term (current) drug therapy: Secondary | ICD-10-CM | POA: Diagnosis not present

## 2017-09-14 DIAGNOSIS — F332 Major depressive disorder, recurrent severe without psychotic features: Secondary | ICD-10-CM | POA: Diagnosis not present

## 2017-09-14 DIAGNOSIS — M549 Dorsalgia, unspecified: Secondary | ICD-10-CM | POA: Diagnosis not present

## 2017-09-14 DIAGNOSIS — F338 Other recurrent depressive disorders: Secondary | ICD-10-CM | POA: Diagnosis not present

## 2017-09-14 DIAGNOSIS — G43909 Migraine, unspecified, not intractable, without status migrainosus: Secondary | ICD-10-CM | POA: Diagnosis not present

## 2017-09-14 DIAGNOSIS — Z981 Arthrodesis status: Secondary | ICD-10-CM | POA: Diagnosis not present

## 2017-09-16 DIAGNOSIS — F332 Major depressive disorder, recurrent severe without psychotic features: Secondary | ICD-10-CM | POA: Diagnosis not present

## 2017-09-16 DIAGNOSIS — F338 Other recurrent depressive disorders: Secondary | ICD-10-CM | POA: Diagnosis not present

## 2017-09-19 ENCOUNTER — Ambulatory Visit (INDEPENDENT_AMBULATORY_CARE_PROVIDER_SITE_OTHER): Payer: Medicare Other | Admitting: Licensed Clinical Social Worker

## 2017-09-19 DIAGNOSIS — F332 Major depressive disorder, recurrent severe without psychotic features: Secondary | ICD-10-CM
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
## 2017-09-23 DIAGNOSIS — F332 Major depressive disorder, recurrent severe without psychotic features: Secondary | ICD-10-CM | POA: Diagnosis not present

## 2017-09-23 DIAGNOSIS — F338 Other recurrent depressive disorders: Secondary | ICD-10-CM | POA: Diagnosis not present

## 2017-10-04 ENCOUNTER — Emergency Department (HOSPITAL_COMMUNITY)
Admission: EM | Admit: 2017-10-04 | Discharge: 2017-10-05 | Disposition: A | Discharge status: Home / Self Care | Payer: Medicare Other | Attending: Emergency Medicine | Admitting: Emergency Medicine

## 2017-10-04 ENCOUNTER — Encounter (HOSPITAL_COMMUNITY): Payer: Self-pay | Admitting: *Deleted

## 2017-10-04 DIAGNOSIS — G43919 Migraine, unspecified, intractable, without status migrainosus: Secondary | ICD-10-CM | POA: Diagnosis not present

## 2017-10-04 DIAGNOSIS — G43909 Migraine, unspecified, not intractable, without status migrainosus: Secondary | ICD-10-CM

## 2017-10-04 DIAGNOSIS — R1013 Epigastric pain: Secondary | ICD-10-CM | POA: Diagnosis not present

## 2017-10-04 DIAGNOSIS — R51 Headache: Secondary | ICD-10-CM | POA: Diagnosis present

## 2017-10-04 DIAGNOSIS — Z79899 Other long term (current) drug therapy: Secondary | ICD-10-CM | POA: Insufficient documentation

## 2017-10-04 LAB — CBC
HCT: 40.3 % (ref 36.0–46.0)
Hemoglobin: 13.6 g/dL (ref 12.0–15.0)
MCH: 28.8 pg (ref 26.0–34.0)
MCHC: 33.7 g/dL (ref 30.0–36.0)
MCV: 85.4 fL (ref 78.0–100.0)
Platelets: 258 10*3/uL (ref 150–400)
RBC: 4.72 MIL/uL (ref 3.87–5.11)
RDW: 14.9 % (ref 11.5–15.5)
WBC: 9.1 10*3/uL (ref 4.0–10.5)

## 2017-10-04 LAB — URINALYSIS, ROUTINE W REFLEX MICROSCOPIC
Bilirubin Urine: NEGATIVE
Glucose, UA: NEGATIVE mg/dL
Ketones, ur: 80 mg/dL — AB
Leukocytes, UA: NEGATIVE
Nitrite: NEGATIVE
Protein, ur: 30 mg/dL — AB
Specific Gravity, Urine: 1.032 — ABNORMAL HIGH (ref 1.005–1.030)
pH: 5 (ref 5.0–8.0)

## 2017-10-04 LAB — COMPREHENSIVE METABOLIC PANEL
ALT: 16 U/L (ref 14–54)
AST: 18 U/L (ref 15–41)
Albumin: 4.4 g/dL (ref 3.5–5.0)
Alkaline Phosphatase: 72 U/L (ref 38–126)
Anion gap: 12 (ref 5–15)
BUN: 18 mg/dL (ref 6–20)
CO2: 21 mmol/L — ABNORMAL LOW (ref 22–32)
Calcium: 9.5 mg/dL (ref 8.9–10.3)
Chloride: 103 mmol/L (ref 101–111)
Creatinine, Ser: 0.6 mg/dL (ref 0.44–1.00)
GFR calc Af Amer: >60 mL/min (ref >60)
GFR calc non Af Amer: >60 mL/min (ref >60)
Glucose, Bld: 101 mg/dL — ABNORMAL HIGH (ref 65–99)
Potassium: 3.8 mmol/L (ref 3.5–5.1)
Sodium: 136 mmol/L (ref 135–145)
Total Bilirubin: 1.1 mg/dL (ref 0.3–1.2)
Total Protein: 7.5 g/dL (ref 6.5–8.1)

## 2017-10-04 LAB — LIPASE, BLOOD: Lipase: 26 U/L (ref 11–51)

## 2017-10-04 MED ORDER — METOCLOPRAMIDE HCL 5 MG/ML IJ SOLN
10.0000 mg | Freq: Once | INTRAMUSCULAR | Status: AC
  Administered 2017-10-04: 10 mg via INTRAVENOUS
  Filled 2017-10-04 (×2): qty 2

## 2017-10-04 MED ORDER — SODIUM CHLORIDE 0.9 % IV BOLUS (SEPSIS)
1000.0000 mL | Freq: Once | INTRAVENOUS | Status: AC
  Administered 2017-10-04: 1000 mL via INTRAVENOUS

## 2017-10-04 MED ORDER — ONDANSETRON 4 MG PO TBDP
4.0000 mg | ORAL_TABLET | Freq: Once | ORAL | Status: AC | PRN
  Administered 2017-10-04: 4 mg via ORAL
  Filled 2017-10-04: qty 1

## 2017-10-04 MED ORDER — KETOROLAC TROMETHAMINE 30 MG/ML IJ SOLN
30.0000 mg | Freq: Once | INTRAMUSCULAR | Status: AC
  Administered 2017-10-04: 30 mg via INTRAVENOUS
  Filled 2017-10-04: qty 1

## 2017-10-04 MED ORDER — DIPHENHYDRAMINE HCL 50 MG/ML IJ SOLN
25.0000 mg | Freq: Once | INTRAMUSCULAR | Status: AC
  Administered 2017-10-04: 25 mg via INTRAVENOUS
  Filled 2017-10-04: qty 1

## 2017-10-04 MED ORDER — SODIUM CHLORIDE 0.9 % IV BOLUS (SEPSIS)
1000.0000 mL | Freq: Once | INTRAVENOUS | Status: AC
  Administered 2017-10-05: 1000 mL via INTRAVENOUS

## 2017-10-04 NOTE — ED Triage Notes (Signed)
Pt stated "I've been sick x 3 days with n/v.  Haven't taken my meds since yesterday.  I have a migraine that won't go away."

## 2017-10-04 NOTE — ED Notes (Signed)
IV attempted x2 without success.

## 2017-10-04 NOTE — ED Notes (Signed)
Pt stated "I have an appt with my therapist tomorrow."

## 2017-10-04 NOTE — ED Notes (Signed)
Pt reports decreased nausea.

## 2017-10-05 ENCOUNTER — Ambulatory Visit (INDEPENDENT_AMBULATORY_CARE_PROVIDER_SITE_OTHER): Payer: Medicare Other | Admitting: Licensed Clinical Social Worker

## 2017-10-05 DIAGNOSIS — F332 Major depressive disorder, recurrent severe without psychotic features: Secondary | ICD-10-CM

## 2017-10-05 DIAGNOSIS — G43909 Migraine, unspecified, not intractable, without status migrainosus: Secondary | ICD-10-CM | POA: Diagnosis not present

## 2017-10-05 MED ORDER — MAGNESIUM SULFATE IN D5W 1-5 GM/100ML-% IV SOLN
1.0000 g | Freq: Once | INTRAVENOUS | Status: AC
  Administered 2017-10-05: 1 g via INTRAVENOUS
  Filled 2017-10-05: qty 100

## 2017-10-05 NOTE — Progress Notes (Signed)
   THERAPIST PROGRESS NOTE  Session Time: 3:00 pm-3:50  Participation Level: Active  Behavioral Response: CasualAlertDepressed  Type of Therapy: Individual Therapy  Treatment Goals addressed: Coping  Interventions: CBT and Solution Focused  Summary: Natasha [name] is a 46 y.o. female who presents  oriented x5 (person, place, situation, time and object), alert, casually dressed, appropriately groomed, walking with a cane and limp, depressed and cooperative  to address mood. Patient has a history of medical treatment due to attempted suicide and a history of mental health treatment including hospitalization, outpatient therapy and medication management. Patient denies symptoms of mania. Patient admitted passive thoughts of suicide and a history of suicide attempts. Patient denies homicidal ideations. Patient denies psychosis including auditory and visual hallucinations. Patient denies substance abuse. Patient is at moderate risk for lethality due to a history of suicidal ideations and attempts.  Patient had an overall score of 4.25 out of 10 on the Outcome Rating Scale. Patient was tearful. She shared her feelings related to her disability and guilt over putting her mother in a nursing home. After discussion, patient understood that she is not a bad person for not liking to care for her mother and placing her in a nursing home where she gets all of her needs met. Patient was worried about her feelings of depression being more severe than she has experienced in the past. After discussion, patient understood that depression is telling her to be hopeless, to feel worthless, like a burden and that she will always feel the way she does. Patient understood that she needs to take small steps like getting up and showering daily to defeat depression. Patient committed to get out of bed, and get dressed for the day. Patient rated the session 7.75 out of 10 on the Session Rating Scale.   Patient engaged in  session. She responded well to interventions. Patient continues to meet criteria for Major depressive disorder, recurrent, severe without psychotic features. Patient will continue in outpatient therapy due to being the least restrictive service to meet her needs at this time. Patient made no progress on her goals.   Suicidal/Homicidal: Negativewithout intent/plan  Therapist Response: Therapist reviewed patient's recent thoughts and feelings. Therapist utilized CBT to address mood. Therapist processed patient's feelings to identify triggers for depression. Therapist assisted patient in identifying small steps to take to reduce her feelings of depression. Therapist committed patient to get out of bed, and get dressed for the day, daily. Therapist administered the Outcome Rating Scale and the Session Rating Scale.   Plan: Return again in 2 weeks. Therapist will review patient goals on or before 01.08.2019  Diagnosis: Axis I: Major depressive disorder, recurrent, severe without psychotic features    Axis II: No diagnosis    Glori Bickers, LCSW 10/05/2017

## 2017-10-05 NOTE — ED Provider Notes (Signed)
Natasha [name] Provider Note   CSN: [id number] Arrival date & time: 10/04/17  2044     History   Chief Complaint Chief Complaint  Patient presents with  . Nausea  . Emesis  . Migraine    HPI Natasha [name] Natasha [name] is a 46 y.o. female.  HPI Natasha [name] is a 46 y.o. female with history of spinal cord injury, depression, currently undergoing ECT therapy, presents to emergency department complaining of a headache. Patient states her headache started about 3 days ago. She states pain is typical of her migraines. States that she used to be on Imitrex for her headaches, but currently not taking any medications. She tried ibuprofen which did not help. She reports associated nausea and vomiting. He denies any changes in her bowels. Reports some epigastric abdominal pain. States nothing is making her symptoms better, reports bright lights are making her symptoms worse. Denies any worsening in her neurological deficits. No blurred vision. No other complaints.  Past Medical History:  Diagnosis Date  . C6 spinal cord injury (HCC)   . Depression     Patient Active Problem List   Diagnosis Date Noted  . MDD (major depressive disorder), recurrent severe, without psychosis (HCC) 02/02/2017  . MDD (major depressive disorder) 10/02/2015  . Severe recurrent major depression without psychotic features (HCC) 06/19/2015    Past Surgical History:  Procedure Laterality Date  . BACK SURGERY      OB History    No data available       Home Medications    Prior to Admission medications   Medication Sig Start Date End Date Taking? Authorizing Provider  busPIRone (BUSPAR) 10 MG tablet Take 10 mg by mouth 3 (three) times daily.   Yes [provider]  clonazePAM (KLONOPIN) 0.5 MG tablet Take 1 tablet (0.5 mg total) by mouth 3 (three) times daily as needed for anxiety. 03/24/17  Yes Arfeen, Phillips Grout, MD  escitalopram (LEXAPRO) 10 MG tablet Take 10 mg by mouth  daily.   Yes [provider]  escitalopram (LEXAPRO) 20 MG tablet Take 1 tablet (20 mg total) by mouth daily. For depression 03/24/17  Yes Arfeen, Phillips Grout, MD  QUEtiapine (SEROQUEL) 25 MG tablet Take 25 mg by mouth 2 (two) times daily.   Yes [provider]  traZODone (DESYREL) 100 MG tablet Take 100 mg by mouth at bedtime as needed for sleep.  08/11/17  Yes [provider]    Family History Family History  Problem Relation Age of Onset  . Alcohol abuse Father   . Alcohol abuse Brother   . Depression Mother   . Alcohol abuse Mother     Social History Social History  Substance Use Topics  . Smoking status: Never Smoker  . Smokeless tobacco: Never Used     Comment: No smoking hx; no need for cessation materials  . Alcohol use No     Comment: Occasional use     Allergies   Patient has no known allergies.   Review of Systems Review of Systems  Constitutional: Negative for chills and fever.  Respiratory: Negative for cough, chest tightness and shortness of breath.   Cardiovascular: Negative for chest pain, palpitations and leg swelling.  Gastrointestinal: Positive for nausea and vomiting. Negative for abdominal pain and diarrhea.  Genitourinary: Negative for dysuria, flank pain, pelvic pain, vaginal bleeding, vaginal discharge and vaginal pain.  Musculoskeletal: Negative for arthralgias, myalgias, neck pain and neck stiffness.  Skin: Negative for rash.  Neurological: Positive for headaches. Negative for dizziness and weakness.  All other systems reviewed and are negative.    Physical Exam Updated Vital Signs BP 128/82 (BP Location: Left Arm)   Pulse 68   Temp 97.6 F (36.4 C) (Oral)   Resp 16   Ht 5' (1.524 m)   Wt 52.2 kg (115 lb)   LMP 09/29/2017 (Approximate)   SpO2 100%   BMI 22.46 kg/m   Physical Exam  Constitutional: She is oriented to person, place, and time. She appears well-developed and well-nourished. No distress.  HENT:    Head: Normocephalic and atraumatic.  Eyes: Pupils are equal, round, and reactive to light. Conjunctivae and EOM are normal.  Neck: Normal range of motion. Neck supple.  No meningismus  Cardiovascular: Normal rate, regular rhythm and normal heart sounds.   Pulmonary/Chest: Effort normal and breath sounds normal. No respiratory distress.  Musculoskeletal: She exhibits no edema.  Neurological: She is alert and oriented to person, place, and time. No cranial nerve deficit. She exhibits normal muscle tone. Coordination normal.  Weakness of bilateral extremities, chronic, with some hand contractures. Gait ataxic, also chronic. Patient denies any new focal neurological exam finding   Skin: Skin is warm and dry. No rash noted.  Psychiatric: She has a normal mood and affect. Her behavior is normal.  Nursing note and vitals reviewed.    ED Treatments / Results  Labs (all labs ordered are listed, but only abnormal results are displayed) Labs Reviewed  COMPREHENSIVE METABOLIC PANEL - Abnormal; Notable for the following:       Result Value   CO2 21 (*)    Glucose, Bld 101 (*)    All other components within normal limits  URINALYSIS, ROUTINE W REFLEX MICROSCOPIC - Abnormal; Notable for the following:    APPearance HAZY (*)    Specific Gravity, Urine 1.032 (*)    Hgb urine dipstick SMALL (*)    Ketones, ur 80 (*)    Protein, ur 30 (*)    Bacteria, UA RARE (*)    Squamous Epithelial / LPF 6-30 (*)    All other components within normal limits  LIPASE, BLOOD  CBC    EKG  EKG Interpretation None       Radiology No results found.  Procedures Procedures (including critical care time)  Medications Ordered in ED Medications  sodium chloride 0.9 % bolus 1,000 mL (not administered)  magnesium sulfate IVPB 1 g 100 mL (not administered)  ondansetron (ZOFRAN-ODT) disintegrating tablet 4 mg (4 mg Oral Given 10/04/17 2137)  ketorolac (TORADOL) 30 MG/ML injection 30 mg (30 mg Intravenous  Given 10/04/17 2335)  metoCLOPramide (REGLAN) injection 10 mg (10 mg Intravenous Given 10/04/17 2331)  diphenhydrAMINE (BENADRYL) injection 25 mg (25 mg Intravenous Given 10/04/17 2334)  sodium chloride 0.9 % bolus 1,000 mL (1,000 mLs Intravenous New Bag/Given 10/04/17 2330)     Initial Impression / Assessment and Plan / ED Course  I have reviewed the triage vital signs and the nursing notes.  Pertinent labs & imaging results that were available during my care of the patient were reviewed by me and considered in my medical decision making (see chart for details).     Patient in emergency department with typical for her migraine headache. No relief with ibuprofen at home. Will get labs, treated with IV fluids and migraine cocktail.  12:01 AM Pain is down to 5 out of 10. We'll try some magnesium. Will assess. Labs unremarkable, urinalysis with 80 ketones. IV fluids  running.   12:57 AM Patient is feeling much better. She is ready to to go home. VS stable. No other complaints. Plan to dc home with outpatient follow up.   Vitals:   10/04/17 2116 10/04/17 2126 10/05/17 0031  BP: 128/82  110/79  Pulse: 68  60  Resp: 16  18  Temp: 97.6 F (36.4 C)    TempSrc: Oral    SpO2: 100%  99%  Weight:  52.2 kg (115 lb)   Height:  5' (1.524 m)       Final Clinical Impressions(s) / ED Diagnoses   Final diagnoses:  Migraine without status migrainosus, not intractable, unspecified migraine type    New Prescriptions New Prescriptions   No medications on file     Jaynie CrumbleKirichenko, Rozlyn Yerby, PA-C 10/05/17 0103    Alvira MondaySchlossman, Erin, MD 10/05/17 1339

## 2017-10-05 NOTE — ED Notes (Signed)
Pt stated "my headache is better and so is the nausea."

## 2017-10-05 NOTE — Discharge Instructions (Signed)
Excedrin migraine for headache as needed. Follow up with family doctor or your headache doctor as needed. Return if worsening.

## 2017-10-06 ENCOUNTER — Other Ambulatory Visit (HOSPITAL_COMMUNITY): Payer: Self-pay | Admitting: Psychiatry

## 2017-10-06 ENCOUNTER — Telehealth (HOSPITAL_COMMUNITY): Payer: Self-pay | Admitting: *Deleted

## 2017-10-06 DIAGNOSIS — F332 Major depressive disorder, recurrent severe without psychotic features: Secondary | ICD-10-CM

## 2017-10-06 MED ORDER — QUETIAPINE FUMARATE 25 MG PO TABS: 25.0000 mg | ORAL_TABLET | Freq: Two times a day (BID) | ORAL | 0 refills | Status: DC

## 2017-10-06 MED ORDER — BUSPIRONE HCL 10 MG PO TABS
10.0000 mg | ORAL_TABLET | Freq: Three times a day (TID) | ORAL | 0 refills | Status: DC
Start: 2017-10-06 — End: 2017-10-21

## 2017-10-06 MED ORDER — ESCITALOPRAM OXALATE 20 MG PO TABS: 30.0000 mg | ORAL_TABLET | Freq: Every day | ORAL | 0 refills | Status: DC

## 2017-10-06 NOTE — Telephone Encounter (Signed)
Pt called stating she is in need of refills for her Buspar, Seroquel and Lexapro. Per pt, she have her other doctor's name on these medications but now that she's out, she needs Dr. Vanetta ShawlHisada [facility] [phone] and [phone].

## 2017-10-06 NOTE — Telephone Encounter (Signed)
Ordered for a month.

## 2017-10-07 DIAGNOSIS — F332 Major depressive disorder, recurrent severe without psychotic features: Secondary | ICD-10-CM | POA: Diagnosis not present

## 2017-10-07 NOTE — Telephone Encounter (Signed)
noted 
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
## 2017-10-11 DIAGNOSIS — F332 Major depressive disorder, recurrent severe without psychotic features: Secondary | ICD-10-CM | POA: Diagnosis not present

## 2017-10-12 DIAGNOSIS — F332 Major depressive disorder, recurrent severe without psychotic features: Secondary | ICD-10-CM | POA: Diagnosis not present

## 2017-10-13 ENCOUNTER — Ambulatory Visit (INDEPENDENT_AMBULATORY_CARE_PROVIDER_SITE_OTHER): Payer: Medicare Other | Admitting: Psychiatry

## 2017-10-13 ENCOUNTER — Encounter (HOSPITAL_COMMUNITY): Payer: Self-pay | Admitting: Psychiatry

## 2017-10-13 VITALS — BP 108/82 | HR 74 | Ht 60.0 in | Wt 124.0 lb

## 2017-10-13 DIAGNOSIS — F332 Major depressive disorder, recurrent severe without psychotic features: Secondary | ICD-10-CM

## 2017-10-13 MED ORDER — CLONAZEPAM 0.5 MG PO TABS: 0.5000 mg | ORAL_TABLET | Freq: Two times a day (BID) | ORAL | 0 refills | Status: DC | PRN

## 2017-10-13 MED ORDER — VENLAFAXINE HCL ER 37.5 MG PO CP24: ORAL_CAPSULE | ORAL | 0 refills | Status: DC

## 2017-10-13 MED ORDER — VENLAFAXINE HCL ER 150 MG PO CP24: ORAL_CAPSULE | ORAL | 0 refills | Status: DC

## 2017-10-13 MED ORDER — DULOXETINE HCL 30 MG PO CPEP: ORAL_CAPSULE | ORAL | 0 refills | Status: DC

## 2017-10-13 NOTE — Patient Instructions (Signed)
1. Decrease lexapro 20 mg daily for one week, then 10 mg daily for one week, then discontinue 2. Start duloxetine 30 mg daily for one week, then 60 mg daily  3. Continue buspar 10 mg three times a day 4. Continue quetiapine 25 mg twice a day 5. Continue trazodone 100 mg at night 6. Continue clonazepam 0.5 mg twice a day for anxiety 7. Return to clinic in one month for 30 mins

## 2017-10-17 ENCOUNTER — Ambulatory Visit (HOSPITAL_COMMUNITY)
Admission: AD | Admit: 2017-10-17 | Discharge: 2017-10-17 | Disposition: A | Discharge status: Home / Self Care | Payer: Medicare Other | Attending: Psychiatry | Admitting: Psychiatry

## 2017-10-17 DIAGNOSIS — F319 Bipolar disorder, unspecified: Secondary | ICD-10-CM | POA: Diagnosis not present

## 2017-10-17 DIAGNOSIS — F4310 Post-traumatic stress disorder, unspecified: Secondary | ICD-10-CM | POA: Diagnosis not present

## 2017-10-18 ENCOUNTER — Emergency Department (HOSPITAL_COMMUNITY)
Admission: EM | Admit: 2017-10-18 | Discharge: 2017-10-18 | Disposition: A | Discharge status: Other Acute Inpatient Hospital | Payer: Medicare Other | Attending: Emergency Medicine | Admitting: Emergency Medicine

## 2017-10-18 ENCOUNTER — Encounter (HOSPITAL_COMMUNITY): Payer: Self-pay

## 2017-10-18 ENCOUNTER — Encounter (HOSPITAL_COMMUNITY): Payer: Self-pay | Admitting: *Deleted

## 2017-10-18 ENCOUNTER — Inpatient Hospital Stay (HOSPITAL_COMMUNITY)
Admission: AD | Admit: 2017-10-18 | Discharge: 2017-10-21 | DRG: 885 | Disposition: A | Discharge status: Other Acute Inpatient Hospital | Payer: Medicare Other | Source: Intra-hospital | Attending: Psychiatry | Admitting: Psychiatry

## 2017-10-18 DIAGNOSIS — Z818 Family history of other mental and behavioral disorders: Secondary | ICD-10-CM

## 2017-10-18 DIAGNOSIS — R45851 Suicidal ideations: Secondary | ICD-10-CM | POA: Diagnosis present

## 2017-10-18 DIAGNOSIS — R450 Nervousness: Secondary | ICD-10-CM

## 2017-10-18 DIAGNOSIS — F39 Unspecified mood [affective] disorder: Secondary | ICD-10-CM

## 2017-10-18 DIAGNOSIS — J449 Chronic obstructive pulmonary disease, unspecified: Secondary | ICD-10-CM | POA: Diagnosis not present

## 2017-10-18 DIAGNOSIS — R4587 Impulsiveness: Secondary | ICD-10-CM | POA: Diagnosis not present

## 2017-10-18 DIAGNOSIS — S14106S Unspecified injury at C6 level of cervical spinal cord, sequela: Secondary | ICD-10-CM

## 2017-10-18 DIAGNOSIS — Z79899 Other long term (current) drug therapy: Secondary | ICD-10-CM | POA: Diagnosis not present

## 2017-10-18 DIAGNOSIS — T1491XA Suicide attempt, initial encounter: Secondary | ICD-10-CM

## 2017-10-18 DIAGNOSIS — G4700 Insomnia, unspecified: Secondary | ICD-10-CM | POA: Diagnosis present

## 2017-10-18 DIAGNOSIS — Z811 Family history of alcohol abuse and dependence: Secondary | ICD-10-CM

## 2017-10-18 DIAGNOSIS — X80XXXS Intentional self-harm by jumping from a high place, sequela: Secondary | ICD-10-CM | POA: Diagnosis present

## 2017-10-18 DIAGNOSIS — F419 Anxiety disorder, unspecified: Secondary | ICD-10-CM | POA: Diagnosis not present

## 2017-10-18 DIAGNOSIS — F332 Major depressive disorder, recurrent severe without psychotic features: Secondary | ICD-10-CM | POA: Insufficient documentation

## 2017-10-18 DIAGNOSIS — T43212A Poisoning by selective serotonin and norepinephrine reuptake inhibitors, intentional self-harm, initial encounter: Secondary | ICD-10-CM | POA: Diagnosis not present

## 2017-10-18 DIAGNOSIS — F411 Generalized anxiety disorder: Secondary | ICD-10-CM | POA: Diagnosis present

## 2017-10-18 DIAGNOSIS — F322 Major depressive disorder, single episode, severe without psychotic features: Secondary | ICD-10-CM | POA: Diagnosis not present

## 2017-10-18 DIAGNOSIS — Z87891 Personal history of nicotine dependence: Secondary | ICD-10-CM | POA: Diagnosis not present

## 2017-10-18 DIAGNOSIS — R4020 Unspecified coma: Secondary | ICD-10-CM | POA: Diagnosis not present

## 2017-10-18 DIAGNOSIS — Z915 Personal history of self-harm: Secondary | ICD-10-CM

## 2017-10-18 LAB — ETHANOL: Alcohol, Ethyl (B): <10 mg/dL (ref <10)

## 2017-10-18 LAB — COMPREHENSIVE METABOLIC PANEL
ALT: 16 U/L (ref 14–54)
AST: 16 U/L (ref 15–41)
Albumin: 4.3 g/dL (ref 3.5–5.0)
Alkaline Phosphatase: 77 U/L (ref 38–126)
Anion gap: 9 (ref 5–15)
BUN: 16 mg/dL (ref 6–20)
CO2: 24 mmol/L (ref 22–32)
Calcium: 9.4 mg/dL (ref 8.9–10.3)
Chloride: 103 mmol/L (ref 101–111)
Creatinine, Ser: 0.47 mg/dL (ref 0.44–1.00)
GFR calc Af Amer: >60 mL/min (ref >60)
GFR calc non Af Amer: >60 mL/min (ref >60)
Glucose, Bld: 100 mg/dL — ABNORMAL HIGH (ref 65–99)
Potassium: 3.8 mmol/L (ref 3.5–5.1)
Sodium: 136 mmol/L (ref 135–145)
Total Bilirubin: 0.6 mg/dL (ref 0.3–1.2)
Total Protein: 7.4 g/dL (ref 6.5–8.1)

## 2017-10-18 LAB — CBC
HCT: 41.5 % (ref 36.0–46.0)
Hemoglobin: 13.7 g/dL (ref 12.0–15.0)
MCH: 28.6 pg (ref 26.0–34.0)
MCHC: 33 g/dL (ref 30.0–36.0)
MCV: 86.6 fL (ref 78.0–100.0)
Platelets: 321 10*3/uL (ref 150–400)
RBC: 4.79 MIL/uL (ref 3.87–5.11)
RDW: 14.9 % (ref 11.5–15.5)
WBC: 10.4 10*3/uL (ref 4.0–10.5)

## 2017-10-18 LAB — RAPID URINE DRUG SCREEN, HOSP PERFORMED
Amphetamines: NOT DETECTED
Barbiturates: NOT DETECTED
Benzodiazepines: NOT DETECTED
Cocaine: NOT DETECTED
Opiates: NOT DETECTED
Tetrahydrocannabinol: NOT DETECTED

## 2017-10-18 LAB — SALICYLATE LEVEL: Salicylate Lvl: <7 mg/dL (ref 2.8–30.0)

## 2017-10-18 LAB — I-STAT BETA HCG BLOOD, ED (MC, WL, AP ONLY): I-stat hCG, quantitative: <5 m[IU]/mL (ref <5)

## 2017-10-18 LAB — ACETAMINOPHEN LEVEL: Acetaminophen (Tylenol), Serum: <10 ug/mL — ABNORMAL LOW (ref 10–30)

## 2017-10-18 MED ORDER — DULOXETINE HCL 30 MG PO CPEP
30.0000 mg | ORAL_CAPSULE | Freq: Every day | ORAL | Status: DC
  Administered 2017-10-19: 30 mg via ORAL
  Filled 2017-10-18 (×4): qty 1

## 2017-10-18 MED ORDER — ESCITALOPRAM OXALATE 10 MG PO TABS
30.0000 mg | ORAL_TABLET | Freq: Every day | ORAL | Status: DC
  Administered 2017-10-18: 30 mg via ORAL
  Filled 2017-10-18: qty 3

## 2017-10-18 MED ORDER — TRAZODONE HCL 100 MG PO TABS
100.0000 mg | ORAL_TABLET | Freq: Every evening | ORAL | Status: DC | PRN
Start: 2017-10-18 — End: 2017-10-18

## 2017-10-18 MED ORDER — ONDANSETRON HCL 4 MG PO TABS: 4.0000 mg | ORAL_TABLET | Freq: Three times a day (TID) | ORAL | Status: DC | PRN

## 2017-10-18 MED ORDER — QUETIAPINE FUMARATE 25 MG PO TABS
25.0000 mg | ORAL_TABLET | Freq: Two times a day (BID) | ORAL | Status: DC
  Administered 2017-10-18 – 2017-10-21 (×6): 25 mg via ORAL
  Filled 2017-10-18 (×13): qty 1

## 2017-10-18 MED ORDER — ENSURE ENLIVE PO LIQD: 237.0000 mL | Freq: Two times a day (BID) | ORAL | Status: DC

## 2017-10-18 MED ORDER — QUETIAPINE FUMARATE 25 MG PO TABS
25.0000 mg | ORAL_TABLET | Freq: Two times a day (BID) | ORAL | Status: DC
  Administered 2017-10-18: 25 mg via ORAL
  Filled 2017-10-18: qty 1

## 2017-10-18 MED ORDER — TRAZODONE HCL 50 MG PO TABS: 50.0000 mg | ORAL_TABLET | Freq: Every evening | ORAL | Status: DC | PRN

## 2017-10-18 MED ORDER — TRAZODONE HCL 100 MG PO TABS
100.0000 mg | ORAL_TABLET | Freq: Every evening | ORAL | Status: DC | PRN
  Administered 2017-10-18 – 2017-10-20 (×2): 100 mg via ORAL
  Filled 2017-10-18 (×2): qty 1

## 2017-10-18 MED ORDER — ACETAMINOPHEN 325 MG PO TABS: 650.0000 mg | ORAL_TABLET | ORAL | Status: DC | PRN

## 2017-10-18 MED ORDER — HYDROXYZINE HCL 25 MG PO TABS
25.0000 mg | ORAL_TABLET | Freq: Three times a day (TID) | ORAL | Status: DC
  Filled 2017-10-18 (×10): qty 1

## 2017-10-18 MED ORDER — ESCITALOPRAM OXALATE 20 MG PO TABS
30.0000 mg | ORAL_TABLET | Freq: Every day | ORAL | Status: DC
  Filled 2017-10-18 (×3): qty 1

## 2017-10-18 MED ORDER — MAGNESIUM HYDROXIDE 400 MG/5ML PO SUSP: 30.0000 mL | Freq: Every day | ORAL | Status: DC | PRN

## 2017-10-18 MED ORDER — ACETAMINOPHEN 325 MG PO TABS
650.0000 mg | ORAL_TABLET | Freq: Four times a day (QID) | ORAL | Status: DC | PRN
  Administered 2017-10-18: 650 mg via ORAL
  Filled 2017-10-18: qty 2

## 2017-10-18 MED ORDER — QUETIAPINE FUMARATE 25 MG PO TABS: 25.0000 mg | ORAL_TABLET | Freq: Two times a day (BID) | ORAL | Status: DC

## 2017-10-18 MED ORDER — BUSPIRONE HCL 10 MG PO TABS
10.0000 mg | ORAL_TABLET | Freq: Three times a day (TID) | ORAL | Status: DC
  Administered 2017-10-18 – 2017-10-21 (×9): 10 mg via ORAL
  Filled 2017-10-18 (×2): qty 1
  Filled 2017-10-18: qty 2
  Filled 2017-10-18 (×12): qty 1

## 2017-10-18 MED ORDER — CLONAZEPAM 0.5 MG PO TABS: 0.5000 mg | ORAL_TABLET | Freq: Two times a day (BID) | ORAL | Status: DC | PRN

## 2017-10-18 MED ORDER — ALUM & MAG HYDROXIDE-SIMETH 200-200-20 MG/5ML PO SUSP: 30.0000 mL | Freq: Four times a day (QID) | ORAL | Status: DC | PRN

## 2017-10-18 MED ORDER — DULOXETINE HCL 30 MG PO CPEP
30.0000 mg | ORAL_CAPSULE | Freq: Every day | ORAL | Status: DC
  Administered 2017-10-18: 30 mg via ORAL
  Filled 2017-10-18: qty 1

## 2017-10-18 MED ORDER — BUSPIRONE HCL 10 MG PO TABS: 10.0000 mg | ORAL_TABLET | Freq: Three times a day (TID) | ORAL | Status: DC

## 2017-10-18 MED ORDER — ALUM & MAG HYDROXIDE-SIMETH 200-200-20 MG/5ML PO SUSP: 30.0000 mL | ORAL | Status: DC | PRN
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
## 2017-10-18 NOTE — Tx Team (Signed)
Initial Treatment Plan 10/18/2017 6:42 PM Keely Philipp OvensK Stuard ZOX:[id number]    PATIENT STRESSORS: Financial difficulties Medication change or noncompliance Other: Mother recently moved to Assisted Living   PATIENT STRENGTHS: Ability for insight Average or above average intelligence Communication skills Supportive family/friends   PATIENT IDENTIFIED PROBLEMS: Suicidal ideation    Depression "unhappy, overwhelming thoughts"  anxiety                 DISCHARGE CRITERIA:  Improved stabilization in mood, thinking, and/or behavior Need for constant or close observation no longer present Reduction of life-threatening or endangering symptoms to within safe limits Verbal commitment to aftercare and medication compliance  PRELIMINARY DISCHARGE PLAN: Outpatient therapy Return to previous living arrangement  PATIENT/FAMILY INVOLVEMENT: This treatment plan has been presented to and reviewed with the patient, Melven SartoriusJenifer K Kuper, and/or family member.  The patient and family have been given the opportunity to ask questions and make suggestions.  Hoover BrownsJones, Anise Harbin Howard, RN 10/18/2017, 6:42 PM

## 2017-10-18 NOTE — ED Notes (Signed)
Bed: WA29 Expected date:  Expected time:  Means of arrival:  Comments: 

## 2017-10-18 NOTE — H&P (Signed)
Behavioral Health Medical Screening Exam  Natasha BladeJenifer K Natasha [name] is an 46 y.o. female presenting as a walk-in, whom is accompanied by a friend seeking evaluation and treatment as previously noted from TTS staff due toSI and 2 recent attempts to OD on her Trazadone. Pt sts that the last attempt occurred last night.Pt sts "if I went homje right now I don't trust that I wouldn't try again. I don't feel safe." Pt sts that she has been increasingly depressed because she lives alone. Pt sts she has lived alone since her last hospitalization in August 2018 when her mother who lived with her was placed in an Assisted Living facility    Total Time spent with patient: 20 minutes  Psychiatric Specialty Exam: Physical Exam  Constitutional: She is oriented to person, place, and time. No distress.  HENT:  Head: Normocephalic and atraumatic.  Eyes: Pupils are equal, round, and reactive to light.  Respiratory: Effort normal and breath sounds normal. No respiratory distress.  Neurological: She is alert and oriented to person, place, and time.  Skin: She is not diaphoretic.    Review of Systems  Psychiatric/Behavioral: Positive for depression and suicidal ideas. Negative for hallucinations and substance abuse. The patient is not nervous/anxious and does not have insomnia.     Last menstrual period 09/29/2017.There is no height or weight on file to calculate BMI.  General Appearance: Casual  Eye Contact:  Fair  Speech:  Slow  Volume:  Decreased  Mood:  Depressed  Affect:  Congruent  Thought Process:  Goal Directed  Orientation:  Full (Time, Place, and Person)  Thought Content:  Logical  Suicidal Thoughts:  Yes.  with intent/plan  Homicidal Thoughts:  No  Memory:  Immediate;   Good  Judgement:  Negative  Insight:  Lacking  Psychomotor Activity:  Normal  Concentration: Concentration: Fair  Recall:  Fair  Fund of Knowledge:Fair  Language: Fair  Akathisia:  Negative  Handed:  Right  AIMS (if  indicated):     Assets:  Desire for Improvement  Sleep:       Musculoskeletal: Strength & Muscle Tone: abnormal Gait & Station: unsteady Patient leans: N/A  Last menstrual period 09/29/2017.  Recommendations:  Based on my evaluation the patient appears to have an emergency medical condition for which I recommend the patient be transferred to the emergency department for further evaluation.  Kerry HoughSpencer E Nariya Neumeyer, PA-C 10/18/2017, 12:57 AM

## 2017-10-18 NOTE — ED Triage Notes (Signed)
Pt c/o increased depression symptoms this week. She states that she attempted to OD 2x earlier this week (trazadone). She came in today because she doesn't trust herself not to do it again. Hx of depression, spinal surgery and SI. She denies drug use. A&Ox4.
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
## 2017-10-18 NOTE — ED Notes (Signed)
[facility]

## 2017-10-18 NOTE — ED Provider Notes (Signed)
[facility]-EMERGENCY DEPT Provider Note   CSN: [id number] Arrival date & time: 10/18/17  0137     History   Chief Complaint Chief Complaint  Patient presents with  . Medical Clearance    HPI Natasha [name] is a 46 y.o. female.  The history is provided by the patient.  She has a history of depression and a C6 spinal cord injury.  She complains of increasing depression over the last 3 months since she put her mother into an assisted living facility.  She has had constitutional symptoms of crying spells and anhedonia but no early morning awakening.  She has had suicidal thoughts, and has tried to overdose on pills twice in the past week.  She denies hallucinations.  She denies ethanol or drug use.  She was seen at behavioral health [facility] this evening and sent here for medical clearance and placement, since they do not have any appropriate beds available for her.  Past Medical History:  Diagnosis Date  . C6 spinal cord injury (HCC)   . Depression     Patient Active Problem List   Diagnosis Date Noted  . Major depressive disorder, recurrent, severe without psychotic features [facility](HCC)     Past Surgical History:  Procedure Laterality Date  . BACK SURGERY      OB History    No data available       Home Medications    Prior to Admission medications   Medication Sig Start Date End Date Taking? Authorizing Provider  busPIRone (BUSPAR) 10 MG tablet Take 1 tablet (10 mg total) by mouth 3 (three) times daily. 10/06/17   Neysa HotterHisada, Reina, MD  clonazePAM (KLONOPIN) 0.5 MG tablet Take 1 tablet (0.5 mg total) by mouth 2 (two) times daily as needed for anxiety. 10/13/17   Neysa HotterHisada, Reina, MD  DULoxetine (CYMBALTA) 30 MG capsule 30 mg daily for one week, then 60 mg daily 10/13/17   Neysa HotterHisada, Reina, MD  escitalopram (LEXAPRO) 20 MG tablet Take 1.5 tablets (30 mg total) by mouth daily. 10/06/17   Neysa HotterHisada, Reina, MD  QUEtiapine (SEROQUEL) 25 MG tablet Take 1 tablet (25 mg  total) by mouth 2 (two) times daily. 10/06/17   Neysa HotterHisada, Reina, MD  traZODone (DESYREL) 100 MG tablet Take 100 mg by mouth at bedtime as needed for sleep.  08/11/17   [provider]    Family History Family History  Problem Relation Age of Onset  . Alcohol abuse Father   . Alcohol abuse Brother   . Depression Mother   . Alcohol abuse Mother     Social History Social History   Tobacco Use  . Smoking status: Never Smoker  . Smokeless tobacco: Never Used  . Tobacco comment: No smoking hx; no need for cessation materials  Substance Use Topics  . Alcohol use: No    Alcohol/week: 0.0 oz    Comment: Occasional use  . Drug use: No     Allergies   Patient has no known allergies.   Review of Systems Review of Systems  All other systems reviewed and are negative.    Physical Exam Updated Vital Signs BP 107/77 (BP Location: Left Arm)   Pulse 67   Temp 98.7 F (37.1 C) (Oral)   Resp 18   LMP 09/29/2017 (Approximate)   SpO2 100%   Physical Exam  Nursing note and vitals reviewed.  46 year old female, resting comfortably and in no acute distress. Vital signs are normal. Oxygen saturation is 100%, which  is normal. Head is normocephalic and atraumatic. PERRLA, EOMI. Oropharynx is clear. Neck is nontender and supple without adenopathy or JVD. Back is nontender and there is no CVA tenderness. Lungs are clear without rales, wheezes, or rhonchi. Chest is nontender. Heart has regular rate and rhythm without murmur. Abdomen is soft, flat, nontender without masses or hepatosplenomegaly and peristalsis is normoactive. Extremities have no cyanosis or edema, full range of motion is present.  Scars are present on both forearms from prior episodes of self-mutilation. Skin is warm and dry without rash. Neurologic: Mental status is normal, cranial nerves are intact.  Moderate weakness of all extremities.  ED Treatments / Results  Labs (all labs ordered are listed, but only  abnormal results are displayed) Labs Reviewed  COMPREHENSIVE METABOLIC PANEL - Abnormal; Notable for the following components:      Result Value   Glucose, Bld 100 (*)    All other components within normal limits  ACETAMINOPHEN LEVEL - Abnormal; Notable for the following components:   Acetaminophen (Tylenol), Serum <10 (*)    All other components within normal limits  ETHANOL  SALICYLATE LEVEL  CBC  RAPID URINE DRUG SCREEN, HOSP PERFORMED  I-STAT BETA HCG BLOOD, ED ([facility], [facility], [facility] ONLY)    Procedures Procedures (including critical care time)  Medications Ordered in ED Medications  alum & mag hydroxide-simeth (MAALOX/MYLANTA) 200-200-20 MG/5ML suspension 30 mL (not administered)  ondansetron (ZOFRAN) tablet 4 mg (not administered)  acetaminophen (TYLENOL) tablet 650 mg (not administered)  busPIRone (BUSPAR) tablet 10 mg (not administered)  clonazePAM (KLONOPIN) tablet 0.5 mg (not administered)  DULoxetine (CYMBALTA) DR capsule 30 mg (not administered)  escitalopram (LEXAPRO) tablet 30 mg (not administered)  QUEtiapine (SEROQUEL) tablet 25 mg (not administered)  traZODone (DESYREL) tablet 100 mg (not administered)     Initial Impression / Assessment and Plan / ED Course  I have reviewed the triage vital signs and the nursing notes.  Pertinent lab results that were available during my care of the patient were reviewed by me and considered in my medical decision making (see chart for details).  Exacerbation of chronic depression with suicidal ideation.  Old records were reviewed, confirming evaluation at behavioral health Hospital earlier today with inpatient care recommended.  She will be placed in psychiatric holding pending appropriate placement.  Final Clinical Impressions(s) / ED Diagnoses   Final diagnoses:  Severe episode of recurrent major depressive disorder, without psychotic features [facility](HCC)  Suicidal ideation    ED Discharge Orders    None       Dione BoozeGlick, Leevi Cullars,  MD 10/18/17 [phone]

## 2017-10-18 NOTE — ED Notes (Signed)
Pt rewanded by [employer].  Pt and belongings taken to TCU.

## 2017-10-18 NOTE — Progress Notes (Signed)
Patient ID: Melven SartoriusJenifer K Mallo, female   DOB: 13-Sep-1971, 46 y.o.   MRN: [id number] Per State regulations 482.30 this chart was reviewed for medical necessity with respect to the patient's admission/duration of stay.    Next review date: 10/21/17  Thurman CoyerEric Nalla Purdy, BSN, RN-BC  Case Manager

## 2017-10-18 NOTE — BH Specialist Note (Signed)
[facility]. The accepting provider is Dr. Sharma CovertNorman and Elta GuadeloupeLaurie Parks, NP.  Nursing report [id number]#[phone]. Patient is voluntary. Support paperwork completed.

## 2017-10-18 NOTE — Consult Note (Addendum)
[place] Psychiatry Consult   Reason for Consult:  Depression and suicidal ideation Referring Physician:  EDP Patient Identification: Natasha [name] MRN:  [id number] Principal Diagnosis: Major depressive disorder, recurrent, severe without psychotic features ([place]) Diagnosis:   Patient Active Problem List   Diagnosis Date Noted  . Major depressive disorder, recurrent, severe without psychotic features ([place]) [F33.2]     Total Time spent with patient: 45 minutes  Subjective:   Natasha [name] is a 46 y.o. female patient admitted with worsening depression and suicidal ideation.  HPI:  Pt was seen and chart reviewed with treatment team and Dr Mariea Clonts. Pt presented to the [facility], voluntarily, with complaints of worsening depression and suicidal ideation. Pt stated she attempted to overdose on her Trazodone twice this week and is afraid to be at home alone. Pt has a long history of suicidal ideation and suicide attempts. Pt denies drug and alcohol abuse, UDS and BAL negative. Pt stated she became overwhelmed due to living by herself. Pt was sole caregiver for her mother but her mother now resides in an assisted living facility. Pt has a family history of mental illness, specifically completed suicide. Pt would benefit form an inpatient psychiatric admission for crisis stabilization and medication management.   Past Psychiatric History: As above  Risk to Self: Is patient at risk for suicide?: Yes Risk to Others:  None.Denies HI. Prior Inpatient Therapy:  Yes. Multiple hospitalizations. Prior Outpatient Therapy:  Medications are managed by her outpatient doctor.   Past Medical History:  Past Medical History:  Diagnosis Date  . C6 spinal cord injury ([place])   . Depression     Past Surgical History:  Procedure Laterality Date  . BACK SURGERY     Family History:  Family History  Problem Relation Age of Onset  . Alcohol abuse Father   . Alcohol abuse Brother   . Depression Mother   .  Alcohol abuse Mother    Family Psychiatric  History: Unknown Social History:  Social History   Substance and Sexual Activity  Alcohol Use No  . Alcohol/week: 0.0 oz   Comment: Occasional use     Social History   Substance and Sexual Activity  Drug Use No    Social History   Socioeconomic History  . Marital status: Single    Spouse name: None  . Number of children: None  . Years of education: None  . Highest education level: None  Social Needs  . Financial resource strain: None  . Food insecurity - worry: None  . Food insecurity - inability: None  . Transportation needs - medical: None  . Transportation needs - non-medical: None  Occupational History  . None  Tobacco Use  . Smoking status: Never Smoker  . Smokeless tobacco: Never Used  . Tobacco comment: No smoking hx; no need for cessation materials  Substance and Sexual Activity  . Alcohol use: No    Alcohol/week: 0.0 oz    Comment: Occasional use  . Drug use: No  . Sexual activity: No  Other Topics Concern  . None  Social History Narrative  . None   Additional Social History: N/A    Allergies:  No Known Allergies  Labs:  Results for orders placed or performed during the hospital encounter of 10/18/17 (from the past 48 hour(s))  Rapid urine drug screen (hospital performed)     Status: None   Collection Time: 10/18/17  4:00 AM  Result Value Ref Range   Opiates NONE DETECTED  NONE DETECTED   Cocaine NONE DETECTED NONE DETECTED   Benzodiazepines NONE DETECTED NONE DETECTED   Amphetamines NONE DETECTED NONE DETECTED   Tetrahydrocannabinol NONE DETECTED NONE DETECTED   Barbiturates NONE DETECTED NONE DETECTED    Comment:        DRUG SCREEN FOR MEDICAL PURPOSES ONLY.  IF CONFIRMATION IS NEEDED FOR ANY PURPOSE, NOTIFY LAB WITHIN 5 DAYS.        LOWEST DETECTABLE LIMITS FOR URINE DRUG SCREEN Drug Class       Cutoff (ng/mL) Amphetamine      1000 Barbiturate      200 Benzodiazepine   540 Tricyclics        981 Opiates          300 Cocaine          300 THC              50   Comprehensive metabolic panel     Status: Abnormal   Collection Time: 10/18/17  4:12 AM  Result Value Ref Range   Sodium 136 135 - 145 mmol/L   Potassium 3.8 3.5 - 5.1 mmol/L   Chloride 103 101 - 111 mmol/L   CO2 24 22 - 32 mmol/L   Glucose, Bld 100 (H) 65 - 99 mg/dL   BUN 16 6 - 20 mg/dL   Creatinine, Ser 0.47 0.44 - 1.00 mg/dL   Calcium 9.4 8.9 - 10.3 mg/dL   Total Protein 7.4 6.5 - 8.1 g/dL   Albumin 4.3 3.5 - 5.0 g/dL   AST 16 15 - 41 U/L   ALT 16 14 - 54 U/L   Alkaline Phosphatase 77 38 - 126 U/L   Total Bilirubin 0.6 0.3 - 1.2 mg/dL   GFR calc non Af Amer >60 >60 mL/min   GFR calc Af Amer >60 >60 mL/min    Comment: (NOTE) The eGFR has been calculated using the CKD EPI equation. This calculation has not been validated in all clinical situations. eGFR's persistently <60 mL/min signify possible Chronic Kidney Disease.    Anion gap 9 5 - 15  Ethanol     Status: None   Collection Time: 10/18/17  4:12 AM  Result Value Ref Range   Alcohol, Ethyl (B) <10 <10 mg/dL    Comment:        LOWEST DETECTABLE LIMIT FOR SERUM ALCOHOL IS 10 mg/dL FOR MEDICAL PURPOSES ONLY   Salicylate level     Status: None   Collection Time: 10/18/17  4:12 AM  Result Value Ref Range   Salicylate Lvl <1.9 2.8 - 30.0 mg/dL  Acetaminophen level     Status: Abnormal   Collection Time: 10/18/17  4:12 AM  Result Value Ref Range   Acetaminophen (Tylenol), Serum <10 (L) 10 - 30 ug/mL    Comment:        THERAPEUTIC CONCENTRATIONS VARY SIGNIFICANTLY. A RANGE OF 10-30 ug/mL MAY BE AN EFFECTIVE CONCENTRATION FOR MANY PATIENTS. HOWEVER, SOME ARE BEST TREATED AT CONCENTRATIONS OUTSIDE THIS RANGE. ACETAMINOPHEN CONCENTRATIONS >150 ug/mL AT 4 HOURS AFTER INGESTION AND >50 ug/mL AT 12 HOURS AFTER INGESTION ARE OFTEN ASSOCIATED WITH TOXIC REACTIONS.   cbc     Status: None   Collection Time: 10/18/17  4:12 AM  Result Value Ref  Range   WBC 10.4 4.0 - 10.5 K/uL   RBC 4.79 3.87 - 5.11 MIL/uL   Hemoglobin 13.7 12.0 - 15.0 g/dL   HCT 41.5 36.0 - 46.0 %   MCV 86.6 78.0 - 100.0  fL   MCH 28.6 26.0 - 34.0 pg   MCHC 33.0 30.0 - 36.0 g/dL   RDW 14.9 11.5 - 15.5 %   Platelets 321 150 - 400 K/uL  I-Stat beta hCG blood, ED     Status: None   Collection Time: 10/18/17  4:31 AM  Result Value Ref Range   I-stat hCG, quantitative <5.0 <5 mIU/mL   Comment 3            Comment:   GEST. AGE      CONC.  (mIU/mL)   <=1 WEEK        5 - 50     2 WEEKS       50 - 500     3 WEEKS       100 - 10,000     4 WEEKS     1,000 - 30,000        FEMALE AND NON-PREGNANT FEMALE:     LESS THAN 5 mIU/mL     Current Facility-Administered Medications  Medication Dose Route Frequency Provider Last Rate Last Dose  . acetaminophen (TYLENOL) tablet 650 mg  650 mg Oral O1H PRN Delora Fuel, MD      . alum & mag hydroxide-simeth (MAALOX/MYLANTA) 200-200-20 MG/5ML suspension 30 mL  30 mL Oral Y8M PRN Delora Fuel, MD      . busPIRone (BUSPAR) tablet 10 mg  10 mg Oral TID Delora Fuel, MD      . clonazePAM Bobbye [place]) tablet 0.5 mg  0.5 mg Oral BID PRN Delora Fuel, MD      . DULoxetine (CYMBALTA) DR capsule 30 mg  30 mg Oral Daily Delora Fuel, MD   30 mg at 10/18/17 1054  . escitalopram (LEXAPRO) tablet 30 mg  30 mg Oral Daily Delora Fuel, MD   30 mg at 10/18/17 1053  . ondansetron (ZOFRAN) tablet 4 mg  4 mg Oral V7Q PRN Delora Fuel, MD      . QUEtiapine (SEROQUEL) tablet 25 mg  25 mg Oral BID Delora Fuel, MD   25 mg at 10/18/17 0859  . traZODone (DESYREL) tablet 100 mg  100 mg Oral QHS PRN Delora Fuel, MD       Current Outpatient Medications  Medication Sig Dispense Refill  . busPIRone (BUSPAR) 10 MG tablet Take 1 tablet (10 mg total) by mouth 3 (three) times daily. 90 tablet 0  . clonazePAM (KLONOPIN) 0.5 MG tablet Take 1 tablet (0.5 mg total) by mouth 2 (two) times daily as needed for anxiety. 60 tablet 0  . escitalopram (LEXAPRO) 20 MG  tablet Take 1.5 tablets (30 mg total) by mouth daily. 45 tablet 0  . QUEtiapine (SEROQUEL) 25 MG tablet Take 1 tablet (25 mg total) by mouth 2 (two) times daily. 60 tablet 0  . traZODone (DESYREL) 100 MG tablet Take 100 mg by mouth at bedtime as needed for sleep.   0  . DULoxetine (CYMBALTA) 30 MG capsule 30 mg daily for one week, then 60 mg daily 90 capsule 0    Musculoskeletal: Strength & Muscle Tone: Patient has lower generalized weakness secondary to spinal cord injury. Gait & Station: UTA since patient was lying in bed.  Patient leans: N/A  Psychiatric Specialty Exam: Physical Exam  Constitutional: She is oriented to person, place, and time. She appears well-developed and well-nourished.  Respiratory: Effort normal.  Neurological: She is alert and oriented to person, place, and time.  Psychiatric: Her speech is normal. She is withdrawn. Cognition and memory  are normal. She expresses impulsivity. She exhibits a depressed mood. She expresses suicidal ideation. She expresses suicidal plans (overdose).    Review of Systems  Psychiatric/Behavioral: Positive for depression and suicidal ideas. Negative for hallucinations, memory loss and substance abuse. The patient is nervous/anxious. The patient does not have insomnia.   All other systems reviewed and are negative.   Blood pressure 115/69, pulse 65, temperature 99.3 F (37.4 C), temperature source Oral, resp. rate 16, last menstrual period 09/29/2017, SpO2 100 %.There is no height or weight on file to calculate BMI.  General Appearance: Casual  Eye Contact:  Good  Speech:  Clear and Coherent and soft spoken  Volume:  Decreased  Mood:  Anxious, Depressed and Dysphoric  Affect:  Congruent and Depressed  Thought Process:  Coherent, Goal Directed and Linear  Orientation:  Full (Time, Place, and Person)  Thought Content:  Logical  Suicidal Thoughts:  Yes.  with intent/plan  Homicidal Thoughts:  No  Memory:  Immediate;   Good Recent;    Good Remote;   Fair  Judgement:  Good  Insight:  Fair  Psychomotor Activity:  spinal cord injury  Concentration:  Concentration: Good and Attention Span: Good  Recall:  Good  Fund of Knowledge:  Good  Language:  Good  Akathisia:  No  Handed:  Right  AIMS (if indicated):   N/A  Assets:  Agricultural consultant Housing Resilience Social Support  ADL's:  Intact  Cognition:  WNL  Sleep:   N/A     Treatment Plan Summary: Plan Major depressive Disorder, severe; without psychotic features  -Crisis stabilization Continue these medications: -Buspar 10 TID for anxiety -Cymbalta 30 mg QD for depression -Lexapro 30 mg QD for depression (pt is weaning off Lexapro to Cymbalta) -Seroquel 25 mg BID for mood stabilization   Disposition: Recommend psychiatric Inpatient admission when medically cleared. Pt [facility] bed 402-2.   Ethelene Hal, NP 10/18/2017 1:36 PM   Patient seen face-to-face for psychiatric evaluation, chart reviewed and case discussed with the physician extender and developed treatment plan. Reviewed the information documented and agree with the treatment plan.  Buford Dresser, DO

## 2017-10-19 ENCOUNTER — Ambulatory Visit (HOSPITAL_COMMUNITY): Payer: Self-pay | Admitting: Licensed Clinical Social Worker

## 2017-10-19 DIAGNOSIS — F332 Major depressive disorder, recurrent severe without psychotic features: Principal | ICD-10-CM

## 2017-10-19 MED ORDER — DULOXETINE HCL 60 MG PO CPEP
60.0000 mg | ORAL_CAPSULE | Freq: Every day | ORAL | Status: DC
  Administered 2017-10-20 – 2017-10-21 (×2): 60 mg via ORAL
  Filled 2017-10-19 (×4): qty 1

## 2017-10-19 MED ORDER — ESCITALOPRAM OXALATE 5 MG PO TABS
5.0000 mg | ORAL_TABLET | Freq: Every day | ORAL | Status: DC
  Administered 2017-10-20: 5 mg via ORAL
  Filled 2017-10-19 (×3): qty 1
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
## 2017-10-19 NOTE — Progress Notes (Signed)
NUTRITION ASSESSMENT  Pt identified as at risk on the Malnutrition Screen Tool  INTERVENTION: 1. Educated patient on the importance of nutrition and encouraged intake of food and beverages. 2. Discussed weight goals. 3. Supplements: continue Ensure Enlive po BID, each supplement provides 350 kcal and 20 grams of protein.  NUTRITION DIAGNOSIS: Unintentional weight loss related to sub-optimal intake as evidenced by pt report.   Goal: Pt to meet >/= 90% of their estimated nutrition needs.  Monitor:  PO intake  Assessment:  Pt admitted for SI; she has 2 past admissions for OD. Per review, pt has lost 13 lbs (9.8% body weight) in the past 3 months; this is significant for time frame. Ensure Enlive ordered BID per ONS protocol at the time of admission. Continue to encourage PO intakes of meals, supplements, and snacks.     46 y.o. female  Height: Ht Readings from Last 1 Encounters:  10/18/17 5' (1.524 m)    Weight: Wt Readings from Last 1 Encounters:  10/18/17 120 lb (54.4 kg)    Weight Hx: Wt Readings from Last 10 Encounters:  10/18/17 120 lb (54.4 kg)  10/13/17 124 lb (56.2 kg)  10/04/17 115 lb (52.2 kg)  09/13/17 116 lb 6.4 oz (52.8 kg)  08/15/17 125 lb (56.7 kg)  07/31/17 133 lb (60.3 kg)  03/24/17 133 lb (60.3 kg)  02/25/17 125 lb (56.7 kg)  02/02/17 137 lb (62.1 kg)  09/09/16 139 lb 9.6 oz (63.3 kg)    BMI:  Body mass index is 23.44 kg/m. Pt meets criteria for normal weight based on current BMI.  Estimated Nutritional Needs: Kcal: 25-30 kcal/kg Protein: > 1 gram protein/kg Fluid: 1 ml/kcal  Diet Order: Diet regular Room service appropriate? No; Fluid consistency: Thin Pt is also offered choice of unit snacks mid-morning and mid-afternoon.  Pt is eating as desired.   Lab results and medications reviewed.     Trenton GammonJessica Nahima Ales, MS, RD, LDN, [facility] Inpatient Clinical Dietitian Pager # [phone] After hours/weekend pager # [phone]

## 2017-10-19 NOTE — Progress Notes (Signed)
D: Pt presents with a sad affect and depressed mood. Pt rates depression 8/10. Anxiety 8/10. Hopelessness 9/10. Pt endorses passive SI with no plan or intent. Pt verbally contracts for safety. Pt denies AVH.[name] Kitchen. Pt reports fair sleep last night but reported feeling tired this am. Pt noted to have a poor appetite this morning, did not eat breakfast. Pt declined to take Ensure when offered. Pt refused to take Vistaril and Lexapro this morning. Pt stated that Vistaril is not effective for tx anxiety. Pt stated that her doctor was weaning her off of the Lexapro. She was to take 20 mg for one week,  10 mg the next week and then discontinue it. Writer informed tx team of pt complaint.  A: Medications reviewed with pt. Medications administered as ordered per MD. Verbal support provided. Pt encouraged to attend groups. 15 minute checks performed for safety. Pt continues to use wheelchair/crutch due to unsteady gait from spinal injury.  R: Pt receptive to tx.

## 2017-10-19 NOTE — Plan of Care (Signed)
Pt able to verbalize to writer SI with no plan or intent. Pt verbally contracts for safety.
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
## 2017-10-19 NOTE — H&P (Signed)
Psychiatric Admission Assessment Adult  Patient Identification: Natasha [name] MRN:  [id number] Date of Evaluation:  10/19/2017 Chief Complaint:  Increasing depression with suicidal behavior.  Principal Diagnosis: MDD Recurrent and treatment resistant  Diagnosis:   Patient Active Problem List   Diagnosis Date Noted  . MDD (major depressive disorder), recurrent episode, severe ([place]) [F33.2] 10/18/2017  . Major depressive disorder, recurrent, severe without psychotic features ([place]) [F33.2]    History of Present Illness:  46 y.o Caucasian female, single, lives alone, disabled. Background history of treatment resistant depression. Currently on ECT and pharmacotherapy. Presented to the unit in company of a friend. Reports increasing depression and suicidal behavior. Overdosed twice on Trazodone at home. Continues to express death wish. Main stressor as recent separation from her mother who has moved into an ALF.  Routine labs are essentially normal. No substance detected.   At interview, patient reports being lonely at home. Says she is not able to cope. She has been depending a lot on her friends and her brother. Says she is worried she might alienate them after a while. Says is sleeping a lot. She is listless. Says normally she is not able to do a whole lot but it has gotten worse. She feels isolated as she not driving. She has been trying to adjust to mom moving away. Says her mom is also struggling to adjust. Patient has been ruminating a lot. Says she has become more worried with the holidays around the corner. Says they used to celebrate at her mom's place. A friend has stepped up and plans to host her this holiday. Patient is also worried about her housing situation. Her brother is paying the mortgage currently. Eventually the house would be sold. Patient has been considering an apartment in [place] that is equipped for handicapped people. She is open to home health aide in the meantime. She  would be willing to get into partial hospitalization if transportation is taken care of. We brainstormed around going into a group home. Patient wants to think about it. Patient tells me that she really did not want to kill herself. Says she just wanted to numb herself. No final acts. Says she has always been impulsive. She felt that if she does not come into a structured environment, she might eventually harm herself. No evidence of psychosis. No evidence of mania. No substance use. No access to weapons. No violent or homicidal thoughts. Patient has good insight and wants to get better.   Total Time spent with patient: 1 hour  Past Psychiatric History:  Long history of depression. She has been tried on multiple combinations over the years. She has had numerous inpatient admission. Patient recently started seeing a psychiatrist at [facility]. She is being swapped from Lexapro to Duloxetine. She has been toleraing he swap well. Patient has recently been started on ECT. She reports very good response to ECT. She was scheduled to have one yesterday. Patient has his of multiple suicide attempts. She is disabled after she jumped off a height at [facility].   Is the patient at risk to self? Yes.    Has the patient been a risk to self in the past 6 months? Yes.    Has the patient been a risk to self within the distant past? Yes.    Is the patient a risk to others? No.  Has the patient been a risk to others in the past 6 months? No.  Has the patient been a risk to others within the  distant past? No.   Prior Inpatient Therapy:   Prior Outpatient Therapy:    Alcohol Screening: 1. How often do you have a drink containing alcohol?: Never 2. How many drinks containing alcohol do you have on a typical day when you are drinking?: 1 or 2(doesn't drink) 3. How often do you have six or more drinks on one occasion?: Never AUDIT-C Score: 0 Substance Abuse History in the last 12 months:  No. Consequences of Substance  Abuse: NA Previous Psychotropic Medications: Yes  Psychological Evaluations: Yes  Past Medical History:  Past Medical History:  Diagnosis Date  . C6 spinal cord injury ([place])   . Depression     Past Surgical History:  Procedure Laterality Date  . BACK SURGERY    . NECK SURGERY     Family History:  Family History  Problem Relation Age of Onset  . Alcohol abuse Father   . Alcohol abuse Brother   . Depression Mother   . Alcohol abuse Mother    Family Psychiatric  History: Strong family history of unipolar depression and suicide.  Tobacco Screening: Have you used any form of tobacco in the last 30 days? (Cigarettes, Smokeless Tobacco, Cigars, and/or Pipes): No Social History:  Social History   Substance and Sexual Activity  Alcohol Use No  . Alcohol/week: 0.0 oz   Comment: Occasional use     Social History   Substance and Sexual Activity  Drug Use No    Additional Social History:      Pain Medications: Denies abuse Prescriptions: Denies abuse Over the Counter: Denies abuse History of alcohol / drug use?: No history of alcohol / drug abuse                    Allergies:  No Known Allergies Lab Results:  Results for orders placed or performed during the hospital encounter of 10/18/17 (from the past 48 hour(s))  Rapid urine drug screen (hospital performed)     Status: None   Collection Time: 10/18/17  4:00 AM  Result Value Ref Range   Opiates NONE DETECTED NONE DETECTED   Cocaine NONE DETECTED NONE DETECTED   Benzodiazepines NONE DETECTED NONE DETECTED   Amphetamines NONE DETECTED NONE DETECTED   Tetrahydrocannabinol NONE DETECTED NONE DETECTED   Barbiturates NONE DETECTED NONE DETECTED    Comment:        DRUG SCREEN FOR MEDICAL PURPOSES ONLY.  IF CONFIRMATION IS NEEDED FOR ANY PURPOSE, NOTIFY LAB WITHIN 5 DAYS.        LOWEST DETECTABLE LIMITS FOR URINE DRUG SCREEN Drug Class       Cutoff (ng/mL) Amphetamine      1000 Barbiturate       200 Benzodiazepine   517 Tricyclics       001 Opiates          300 Cocaine          300 THC              50   Comprehensive metabolic panel     Status: Abnormal   Collection Time: 10/18/17  4:12 AM  Result Value Ref Range   Sodium 136 135 - 145 mmol/L   Potassium 3.8 3.5 - 5.1 mmol/L   Chloride 103 101 - 111 mmol/L   CO2 24 22 - 32 mmol/L   Glucose, Bld 100 (H) 65 - 99 mg/dL   BUN 16 6 - 20 mg/dL   Creatinine, Ser 0.47 0.44 - 1.00 mg/dL  Calcium 9.4 8.9 - 10.3 mg/dL   Total Protein 7.4 6.5 - 8.1 g/dL   Albumin 4.3 3.5 - 5.0 g/dL   AST 16 15 - 41 U/L   ALT 16 14 - 54 U/L   Alkaline Phosphatase 77 38 - 126 U/L   Total Bilirubin 0.6 0.3 - 1.2 mg/dL   GFR calc non Af Amer >60 >60 mL/min   GFR calc Af Amer >60 >60 mL/min    Comment: (NOTE) The eGFR has been calculated using the CKD EPI equation. This calculation has not been validated in all clinical situations. eGFR's persistently <60 mL/min signify possible Chronic Kidney Disease.    Anion gap 9 5 - 15  Ethanol     Status: None   Collection Time: 10/18/17  4:12 AM  Result Value Ref Range   Alcohol, Ethyl (B) <10 <10 mg/dL    Comment:        LOWEST DETECTABLE LIMIT FOR SERUM ALCOHOL IS 10 mg/dL FOR MEDICAL PURPOSES ONLY   Salicylate level     Status: None   Collection Time: 10/18/17  4:12 AM  Result Value Ref Range   Salicylate Lvl <0.1 2.8 - 30.0 mg/dL  Acetaminophen level     Status: Abnormal   Collection Time: 10/18/17  4:12 AM  Result Value Ref Range   Acetaminophen (Tylenol), Serum <10 (L) 10 - 30 ug/mL    Comment:        THERAPEUTIC CONCENTRATIONS VARY SIGNIFICANTLY. A RANGE OF 10-30 ug/mL MAY BE AN EFFECTIVE CONCENTRATION FOR MANY PATIENTS. HOWEVER, SOME ARE BEST TREATED AT CONCENTRATIONS OUTSIDE THIS RANGE. ACETAMINOPHEN CONCENTRATIONS >150 ug/mL AT 4 HOURS AFTER INGESTION AND >50 ug/mL AT 12 HOURS AFTER INGESTION ARE OFTEN ASSOCIATED WITH TOXIC REACTIONS.   cbc     Status: None   Collection  Time: 10/18/17  4:12 AM  Result Value Ref Range   WBC 10.4 4.0 - 10.5 K/uL   RBC 4.79 3.87 - 5.11 MIL/uL   Hemoglobin 13.7 12.0 - 15.0 g/dL   HCT 41.5 36.0 - 46.0 %   MCV 86.6 78.0 - 100.0 fL   MCH 28.6 26.0 - 34.0 pg   MCHC 33.0 30.0 - 36.0 g/dL   RDW 14.9 11.5 - 15.5 %   Platelets 321 150 - 400 K/uL  I-Stat beta hCG blood, ED     Status: None   Collection Time: 10/18/17  4:31 AM  Result Value Ref Range   I-stat hCG, quantitative <5.0 <5 mIU/mL   Comment 3            Comment:   GEST. AGE      CONC.  (mIU/mL)   <=1 WEEK        5 - 50     2 WEEKS       50 - 500     3 WEEKS       100 - 10,000     4 WEEKS     1,000 - 30,000        FEMALE AND NON-PREGNANT FEMALE:     LESS THAN 5 mIU/mL     Blood Alcohol level:  Lab Results  Component Value Date   [facility] <10 10/18/2017   ETH <5 75/09/2584    Metabolic Disorder Labs:  Lab Results  Component Value Date   HGBA1C 4.7 (L) 02/03/2017   MPG 88 02/03/2017   MPG 108 10/31/2015   Lab Results  Component Value Date   PROLACTIN 121.9 (H) 02/03/2017   Lab  Results  Component Value Date   CHOL 214 (H) 02/03/2017   TRIG 117 02/03/2017   HDL 42 02/03/2017   CHOLHDL 5.1 02/03/2017   VLDL 23 02/03/2017   LDLCALC 149 (H) 02/03/2017    Current Medications: Current Facility-Administered Medications  Medication Dose Route Frequency Provider Last Rate Last Dose  . acetaminophen (TYLENOL) tablet 650 mg  650 mg Oral Q6H PRN Ethelene Hal, NP   650 mg at 10/18/17 1812  . alum & mag hydroxide-simeth (MAALOX/MYLANTA) 200-200-20 MG/5ML suspension 30 mL  30 mL Oral Q4H PRN Ethelene Hal, NP      . busPIRone (BUSPAR) tablet 10 mg  10 mg Oral TID Ethelene Hal, NP   10 mg at 10/19/17 1207  . clonazePAM (KLONOPIN) tablet 0.5 mg  0.5 mg Oral BID PRN Ethelene Hal, NP      . DULoxetine (CYMBALTA) DR capsule 30 mg  30 mg Oral Daily Ethelene Hal, NP   30 mg at 10/19/17 8270  . escitalopram (LEXAPRO) tablet 30 mg   30 mg Oral Daily Ethelene Hal, NP      . feeding supplement (ENSURE ENLIVE) (ENSURE ENLIVE) liquid 237 mL  237 mL Oral BID BM Cobos, [name], MD      . hydrOXYzine (ATARAX/VISTARIL) tablet 25 mg  25 mg Oral TID Ethelene Hal, NP      . magnesium hydroxide (MILK OF MAGNESIA) suspension 30 mL  30 mL Oral Daily PRN Ethelene Hal, NP      . ondansetron [facility]) tablet 4 mg  4 mg Oral Q8H PRN Ethelene Hal, NP      . QUEtiapine (SEROQUEL) tablet 25 mg  25 mg Oral BID Ethelene Hal, NP   25 mg at 10/19/17 7867  . traZODone (DESYREL) tablet 100 mg  100 mg Oral QHS PRN Ethelene Hal, NP   100 mg at 10/18/17 2107   PTA Medications: Medications Prior to Admission  Medication Sig Dispense Refill Last Dose  . busPIRone (BUSPAR) 10 MG tablet Take 1 tablet (10 mg total) by mouth 3 (three) times daily. 90 tablet 0 10/16/2017  . clonazePAM (KLONOPIN) 0.5 MG tablet Take 1 tablet (0.5 mg total) by mouth 2 (two) times daily as needed for anxiety. 60 tablet 0 10/16/2017  . DULoxetine (CYMBALTA) 30 MG capsule 30 mg daily for one week, then 60 mg daily 90 capsule 0 has not started  . escitalopram (LEXAPRO) 20 MG tablet Take 1.5 tablets (30 mg total) by mouth daily. 45 tablet 0 Past Week at Unknown time  . QUEtiapine (SEROQUEL) 25 MG tablet Take 1 tablet (25 mg total) by mouth 2 (two) times daily. 60 tablet 0 10/16/2017  . traZODone (DESYREL) 100 MG tablet Take 100 mg by mouth at bedtime as needed for sleep.   0 10/16/2017    Musculoskeletal: Strength & Muscle Tone: Blood pressure 93/66, pulse 86, temperature 98.7 F (37.1 C), temperature source Oral, resp. rate 16, height 5' (1.524 m), weight 54.4 kg (120 lb), last menstrual period 09/29/2017. Muscle strength: Unable to assess at this time.  Gait & Station: Unable to assess at this time.  Patient leans: N/A  Psychiatric Specialty Exam: Physical Exam  Constitutional: She appears well-developed and well-nourished.   HENT:  Head: Normocephalic and atraumatic.  Respiratory: Effort normal.  Neurological: She is alert.  Psychiatric:  As above    ROS  Blood pressure 93/66, pulse 86, temperature 98.7 F (37.1 C), temperature source Oral,  resp. rate 16, height 5' (1.524 m), weight 54.4 kg (120 lb), last menstrual period 09/29/2017.Body mass index is 23.44 kg/m.  General Appearance: In wheelchair, marked psychomotor retardation. Good rapport. Appropriate behavior.   Eye Contact:  Good  Speech:  Soft spoken  Volume:  Decreased  Mood:  Depressed  Affect: Blunted and mod congruent.   Thought Process:  Decreased speed of thought. Linear.   Orientation:  Full (Time, Place, and Person)  Thought Content:  Negative ruminations, no thoughts of violence. No abnormal perception, no delusional theme.   Suicidal Thoughts:  Still there but no intent to act here.   Homicidal Thoughts:  No  Memory:  Immediate;   Good Recent;   Good Remote;   Good  Judgement:  Fair  Insight:  Good  Psychomotor Activity:  Decreased  Concentration:  Concentration: Fair and Attention Span: Fair  Recall:  Good  Fund of Knowledge:  Good  Language:  Good  Akathisia:  Negative  Handed:    AIMS (if indicated):     Assets:  Communication Skills Desire for Improvement Financial Resources/Insurance Housing Resilience Social Support  ADL's:  Intact  Cognition:  WNL  Sleep:       Treatment Plan Summary: Patient is severely depressed and suicidal. She is grieving separation from her mom. Patient has multiple past suicidal behavior. She is disabled from a previous suicidal attempt. Patient is at high risk of suicide. We have agreed to adjust her medications as below.  Patient would need more support upon discharge. Home health aide and eventually Group Home placement are being explored.    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 Decrease Lexapro to 5 mg daily for two days.  2. Increase Duloxetine to 60 mg daily 3. Continue Seroquel at current dose.  4. SW would explore transfer to [place] so she can continue ECT 5. Encourage unit groups and activities 6. Monitor mood, behavior and interaction with peers 7. SW would explore home health aide and group home placement with patient   Observation Level/Precautions:  15 minute checks  Laboratory:    Psychotherapy:    Medications:    Consultations:    Discharge Concerns:    Estimated LOS:  Other:     Physician Treatment Plan for Primary Diagnosis: <principal problem not specified> Long Term Goal(s): Improvement in symptoms so as ready for discharge  Short Term Goals: Ability to identify changes in lifestyle to reduce recurrence of condition will improve, Ability to verbalize feelings will improve, Ability to disclose and discuss suicidal ideas, Ability to demonstrate self-control will improve, Ability to identify and develop effective coping behaviors will improve, Ability to maintain clinical measurements within normal limits will improve and Compliance with prescribed medications will improve  Physician Treatment Plan for Secondary Diagnosis: Active Problems:   MDD (major depressive disorder), recurrent episode, severe ([place])  Long Term Goal(s): Improvement in symptoms so as ready for discharge  Short Term Goals: Ability to identify changes in lifestyle to reduce recurrence of condition will improve, Ability to verbalize feelings will improve, Ability to disclose and discuss suicidal ideas, Ability to demonstrate self-control will improve, Ability to identify and develop effective coping behaviors will improve, Ability to maintain clinical measurements within normal limits will improve and Compliance with prescribed medications will improve  I certify that inpatient services furnished can reasonably be expected to improve the  patient's condition.  [place], MD 11/7/201812:44 PM

## 2017-10-19 NOTE — Progress Notes (Signed)
D: Pt was in the dayroom upon initial approach.  Pt presents with depressed affect and mood.  Her goal is "just safety."  She reports she has not been eating well today.  Pt endorses SI without a plan.  Denies HI, denies hallucinations, denies pain.  Pt has been visible in milieu with few peer interactions.  She is using a wheelchair for mobility.  Pt attended evening group.    A: Introduced self to pt.  Actively listened to pt and offered support and encouragement. Medication administered per order.  PRN medication administered for sleep.  PO fluids encouraged and Gatorade provided.  Fall prevention techniques reviewed with pt, pt verbalized understanding.  Q15 minute safety checks maintained.  R: Pt is safe on the unit.  Pt is compliant with medications.  Pt verbally contracts for safety.  Will continue to monitor and assess.

## 2017-10-19 NOTE — Plan of Care (Signed)
  Self-Concept: Ability to disclose and discuss suicidal ideas will improve 10/19/2017 0130 - Progressing by Arrie Aranhurch, Rohen Kimes J, RN Note Pt endorses SI without a plan tonight.  She verbally contracts for safety.

## 2017-10-19 NOTE — Progress Notes (Signed)
Recreation Therapy Notes  Date: 10/19/17 Time: 0930 Location: 300 [address] Dayroom  Group Topic: Stress Management  Goal Area(s) Addresses:  Patient will verbalize importance of using healthy stress management.  Patient will identify positive emotions associated with healthy stress management.   Intervention: Stress Management  Activity :  Guided Imagery.  LRT introduced the stress management technique of guided imagery.  LRT read a script to allow patients to take a mental vacation to escape their daily routine for a few minutes.  Education:  Stress Management, Discharge Planning.   Education Outcome: Acknowledges edcuation/In group clarification offered/Needs additional education  Clinical Observations/Feedback: Pt did not attend group.    Caroll RancherMarjette Tyanna Hach, LRT/CTRS         Caroll RancherLindsay, Raya Mckinstry A 10/19/2017 11:52 AM

## 2017-10-19 NOTE — Progress Notes (Signed)
D: Pt was in the dayroom.  She noted, "I have been in the bed all day."  Her goal is "attend groups, but I will try harder tomorrow to go. I feel more depressed when I'm alone. I am hoping the doctor will get me on the new medications."  Pt endorses SI without a plan.  Denies HI, denies hallucinations, denies pain. She is using a wheelchair for mobility.   A: Introduced self to pt.  Provided support and encouragement. Medication administered per order. Fall prevention techniques reviewed with pt, pt verbalized understanding.  Q15 minute safety checks maintained.  R: Pt is safe on the unit.  Pt is compliant with medications.  Pt verbally contracts for safety.  Will continue to monitor, maintain safety, and assess.

## 2017-10-19 NOTE — Progress Notes (Signed)
Pt needed to be reminded to lock wheelchair.

## 2017-10-19 NOTE — Progress Notes (Signed)
Pt voiced MD was supposed to up my dosage of seroquel. Educated pt on medication. Will give other sleeping regimen at later time. Staff will continue to monitor, maintain safety, and meet needs.

## 2017-10-20 DIAGNOSIS — F411 Generalized anxiety disorder: Secondary | ICD-10-CM | POA: Diagnosis present

## 2017-10-20 NOTE — Progress Notes (Signed)
D: Pt presents with a flat affect and depressed mood. Pt appeared increasingly sad on approach. Pt verbalized feeling like she didn't want to be alive. Pt endorses passive SI with no plan or intent. Pt verbally contracts for safety. Pt rates depression 8/10. Anxiety 8/10. Pt stressors are being alone and her mother having to live in an assisted living facility. Writer discussed with pt therapy options to speak with the CSW about. Pt stated that transportation is a barrier and doesn't have reliable transportation to appts. Writer made treatment team aware of pt complaints.  A: Medications reviewed with pt. Medications administered as ordered per MD. Verbal support provided. Pt encouraged to attend groups. 15 minute checks performed for safety.  R: Pt compliant with tx.

## 2017-10-20 NOTE — Progress Notes (Signed)
CSW made a referral on pt's behalf for ECT at [facility]. Pt has been accepted to BMU. Per Margaretmary LombardFatima (TTS) pt can be transported tomorrow. Awaiting bed assignment. Consulting civil engineerCharge RN notified.   Jonathon JordanLynn B Kevontay Burks, MSW, Theresia MajorsLCSWA [phone]

## 2017-10-20 NOTE — BH Assessment (Signed)
Pt chart under review by Dr[facility].

## 2017-10-20 NOTE — Progress Notes (Signed)
Patient is resting in bed with eyes closed. Respirations are even and non labored. No distress noted. Q 15 minute checks in progress and patient remains safe on unit. Monitoring continues.

## 2017-10-20 NOTE — Progress Notes (Signed)
[facility] Progress Note  10/20/2017 12:41 PM Natasha [name]  MRN:  [id number]   Subjective:  Patient reports that she is doing ok today. She still feels very depressed and rates depression at 8/10 and anxiety at 8/10. She states that a big problem she has is living alone. She has had some friends to stay with her but she cannot ask them to stay all the time because it would be a burden for them. She is asking about what is happening with her ECT treatments and I do not have an answer yet. Patient denies any SI/HI/AVHa nd contracts for safety.  Objective: Patient's chart and findings reviewed and discussed with treatment team. Patient is pleasant and cooperative. She has been seen in the day room interacting appropriately. Will continue the taper off of Lexapro today and discontinue it. Will continue all other medications. CSW is researching ECT treatment plan while in inpatient setting.    Principal Problem: MDD (major depressive disorder), recurrent episode, severe (HCC) Diagnosis:   Patient Active Problem List   Diagnosis Date Noted  . MDD (major depressive disorder), recurrent episode, severe (HCC) [F33.2] 10/18/2017  . Major depressive disorder, recurrent, severe without psychotic features (HCC) [F33.2]    Total Time spent with patient: 25 minutes  Past Psychiatric History: See h&P  Past Medical History:  Past Medical History:  Diagnosis Date  . C6 spinal cord injury (HCC)   . Depression     Past Surgical History:  Procedure Laterality Date  . BACK SURGERY    . NECK SURGERY     Family History:  Family History  Problem Relation Age of Onset  . Alcohol abuse Father   . Alcohol abuse Brother   . Depression Mother   . Alcohol abuse Mother    Family Psychiatric  History: See H&P Social History:  Social History   Substance and Sexual Activity  Alcohol Use No  . Alcohol/week: 0.0 oz   Comment: Occasional use     Social History   Substance and Sexual Activity  Drug Use No     Social History   Socioeconomic History  . Marital status: Single    Spouse name: None  . Number of children: None  . Years of education: None  . Highest education level: None  Social Needs  . Financial resource strain: None  . Food insecurity - worry: None  . Food insecurity - inability: None  . Transportation needs - medical: None  . Transportation needs - non-medical: None  Occupational History  . None  Tobacco Use  . Smoking status: Former Games developermoker  . Smokeless tobacco: Never Used  . Tobacco comment: No smoking hx; no need for cessation materials  Substance and Sexual Activity  . Alcohol use: No    Alcohol/week: 0.0 oz    Comment: Occasional use  . Drug use: No  . Sexual activity: No  Other Topics Concern  . None  Social History Narrative  . None   Additional Social History:    Pain Medications: Denies abuse Prescriptions: Denies abuse Over the Counter: Denies abuse History of alcohol / drug use?: No history of alcohol / drug abuse                    Sleep: Good  Appetite:  Good  Current Medications: Current Facility-Administered Medications  Medication Dose Route Frequency Provider Last Rate Last Dose  . acetaminophen (TYLENOL) tablet 650 mg  650 mg Oral Q6H PRN Laveda AbbeParks, Laurie Britton, NP  650 mg at 10/18/17 1812  . alum & mag hydroxide-simeth (MAALOX/MYLANTA) 200-200-20 MG/5ML suspension 30 mL  30 mL Oral Q4H PRN Laveda Abbe, NP      . busPIRone (BUSPAR) tablet 10 mg  10 mg Oral TID Laveda Abbe, NP   10 mg at 10/20/17 1215  . clonazePAM (KLONOPIN) tablet 0.5 mg  0.5 mg Oral BID PRN Laveda Abbe, NP      . DULoxetine (CYMBALTA) DR capsule 60 mg  60 mg Oral Daily Izediuno, Delight Ovens, MD   60 mg at 10/20/17 0817  . feeding supplement (ENSURE ENLIVE) (ENSURE ENLIVE) liquid 237 mL  237 mL Oral BID BM Cobos, Fernando A, MD      . magnesium hydroxide (MILK OF MAGNESIA) suspension 30 mL  30 mL Oral Daily PRN Laveda Abbe, NP      . ondansetron [facility]) tablet 4 mg  4 mg Oral Q8H PRN Laveda Abbe, NP      . QUEtiapine (SEROQUEL) tablet 25 mg  25 mg Oral BID Laveda Abbe, NP   25 mg at 10/20/17 0817  . traZODone (DESYREL) tablet 100 mg  100 mg Oral QHS PRN Laveda Abbe, NP   100 mg at 10/18/17 2107    Lab Results: No results found for this or any previous visit (from the past 48 hour(s)).  Blood Alcohol level:  Lab Results  Component Value Date   ETH <10 10/18/2017   ETH <5 08/15/2017    Metabolic Disorder Labs: Lab Results  Component Value Date   HGBA1C 4.7 (L) 02/03/2017   MPG 88 02/03/2017   MPG 108 10/31/2015   Lab Results  Component Value Date   PROLACTIN 121.9 (H) 02/03/2017   Lab Results  Component Value Date   CHOL 214 (H) 02/03/2017   TRIG 117 02/03/2017   HDL 42 02/03/2017   CHOLHDL 5.1 02/03/2017   VLDL 23 02/03/2017   LDLCALC 149 (H) 02/03/2017    Physical Findings: AIMS: Facial and Oral Movements Muscles of Facial Expression: None, normal Lips and Perioral Area: None, normal Jaw: None, normal Tongue: None, normal,Extremity Movements Upper (arms, wrists, hands, fingers): None, normal Lower (legs, knees, ankles, toes): None, normal, Trunk Movements Neck, shoulders, hips: None, normal, Overall Severity Severity of abnormal movements (highest score from questions above): None, normal Incapacitation due to abnormal movements: None, normal Patient's awareness of abnormal movements (rate only patient's report): No Awareness, Dental Status Current problems with teeth and/or dentures?: No Does patient usually wear dentures?: No  CIWA:    COWS:     Musculoskeletal: Strength & Muscle Tone: decreased Gait & Station: unsteady uses crutches at home but is using a wheelchair while here Patient leans: N/A  Psychiatric Specialty Exam: Physical Exam  Nursing note and vitals reviewed. Constitutional: She is oriented to person, place, and time.  She appears well-developed and well-nourished.  Cardiovascular: Normal rate.  Respiratory: Effort normal.  Musculoskeletal: Normal range of motion.  Neurological: She is alert and oriented to person, place, and time.  Skin: Skin is warm.    Review of Systems  Constitutional: Negative.   HENT: Negative.   Eyes: Negative.   Respiratory: Negative.   Cardiovascular: Negative.   Gastrointestinal: Negative.   Genitourinary: Negative.   Skin: Negative.   Neurological: Negative.   Endo/Heme/Allergies: Negative.   Psychiatric/Behavioral: Positive for depression. Negative for hallucinations and suicidal ideas. The patient is nervous/anxious.     Blood pressure 98/64, pulse 99, temperature 98.4 F (  36.9 C), temperature source Oral, resp. rate 16, height 5' (1.524 m), weight 54.4 kg (120 lb), last menstrual period 09/29/2017.Body mass index is 23.44 kg/m.  General Appearance: Casual  Eye Contact:  Good  Speech:  Clear and Coherent and Normal Rate  Volume:  Normal  Mood:  Depressed  Affect:  Flat  Thought Process:  Goal Directed and Descriptions of Associations: Intact  Orientation:  Full (Time, Place, and Person)  Thought Content:  WDL  Suicidal Thoughts:  No  Homicidal Thoughts:  No  Memory:  Immediate;   Good Recent;   Good Remote;   Good  Judgement:  Good  Insight:  Good  Psychomotor Activity:  Normal  Concentration:  Concentration: Good and Attention Span: Good  Recall:  Good  Fund of Knowledge:  Good  Language:  Good  Akathisia:  No  Handed:  Right  AIMS (if indicated):     Assets:  Communication Skills Desire for Improvement Financial Resources/Insurance Housing Social Support Transportation  ADL's:  Intact  Cognition:  WNL  Sleep:  Number of Hours: 6.75   Problems Addressed: MDD severe GAD  Treatment Plan Summary: Daily contact with patient to assess and evaluate symptoms and progress in treatment, Medication management and Plan is to:  -Continue Seroquel 25  mg PO BID for mood stability -Continue Cymbalta 60 mg PO Daily for mood stability -Discontinue Lexapro -Continue Klonopin 0.5 mg PO BID PRN for anxiety -Continue Buspar 10 mg PO TID for mood stability -Continue Trazodone 100 mg PO QHS PRN for insomnia -Encourage group therapy participation  Maryfrances Bunnellravis B Segundo Makela, FNP 10/20/2017, 12:42 PM

## 2017-10-20 NOTE — BH Assessment (Signed)
[facility]. Pt pending bed assignment. Larita FifeLynn, LCSWA informed.

## 2017-10-20 NOTE — Progress Notes (Signed)
BHH Group Notes:  (Nursing/MHT/Case Management/Adjunct)  Date:  10/20/2017  Time:  2100  Type of Therapy:  wrap up group  Participation Level:  Active  Participation Quality:  Appropriate, Attentive and Sharing  Affect:  Depressed and Flat  Cognitive:  Appropriate  Insight:  Lacking  Engagement in Group:  Engaged and Supportive  Modes of Intervention:  Clarification, Education and Support  Summary of Progress/Problems: Pt shared that she had a good visit with a friend today and that she learned of new options for treatment. Pt wants to feel like she has a purpose and that this life is for her again, Pt is grateful for friends and if she could change any one thing it would be for her mom to not have Parkinson's disease.   Johann CapersMcNeil, Arthurine Oleary S 10/20/2017, 10:01 PM

## 2017-10-20 NOTE — BHH Group Notes (Signed)
LCSW Group Therapy Note  10/20/2017 1:15pm  Type of Therapy/Topic:  Group Therapy:  Balance in Life  Participation Level:  Active  Description of Group:    This group will address the concept of balance and how it feels and looks when one is unbalanced. Patients will be encouraged to process areas in their lives that are out of balance and identify reasons for remaining unbalanced. Facilitators will guide patients in utilizing problem-solving interventions to address and correct the stressor making their life unbalanced. Understanding and applying boundaries will be explored and addressed for obtaining and maintaining a balanced life. Patients will be encouraged to explore ways to assertively make their unbalanced needs known to significant others in their lives, using other group members and facilitator for support and feedback.  Therapeutic Goals: 1. Patient will identify two or more emotions or situations they have that consume much of in their lives. 2. Patient will identify signs/triggers that life has become out of balance:  3. Patient will identify two ways to set boundaries in order to achieve balance in their lives:  4. Patient will demonstrate ability to communicate their needs through discussion and/or role plays   Therapeutic Modalities:   Cognitive Behavioral Therapy Solution-Focused Therapy Assertiveness Training  Natasha JordanLynn B Natasha [name], MSW, LCSWA 10/20/2017 3:53 PM

## 2017-10-20 NOTE — Progress Notes (Signed)
Pt attend wrap up group. Her day was a 5. Pt said she slept most of her day away and did not attend the othetr group session.she did not go.

## 2017-10-20 NOTE — BHH Suicide Risk Assessment (Signed)
BHH INPATIENT:  Family/Significant Other Suicide Prevention Education  Suicide Prevention Education:  Patient Refusal for Family/Significant Other Suicide Prevention Education: The patient Melven SartoriusJenifer K Felter has refused to provide written consent for family/significant other to be provided Family/Significant Other Suicide Prevention Education during admission and/or prior to discharge.  Physician notified.  Jonathon JordanLynn B Buena Boehm, MSW, LCSWA  10/20/2017, 10:33 AM

## 2017-10-20 NOTE — BH Assessment (Signed)
[facility] by Dr. Toni Amendlapacs.  Attending Physician will be Dr. Toni Amendlapacs.   Patient has been [facility] to room 305B, [facility] NomePhyllis.   Eliseo GumLynn, LCSW and [place], [facility] have been informed. Per Larita FifeLynn, LCSW pt will transport to BMU 11.9.18. Support papers requested to be faxed to [phone] prior to pt transport. Call report to [phone]. Pt access Mertie Clause(Jeanelle) informed of pt admission for pre-admit.

## 2017-10-21 ENCOUNTER — Inpatient Hospital Stay
Admission: EM | Admit: 2017-10-21 | Discharge: 2017-11-01 | DRG: 885 | Disposition: A | Discharge status: Home / Self Care | Payer: Medicare Other | Source: Intra-hospital | Attending: Psychiatry | Admitting: Psychiatry

## 2017-10-21 ENCOUNTER — Inpatient Hospital Stay: Payer: Medicare Other

## 2017-10-21 ENCOUNTER — Other Ambulatory Visit: Payer: Self-pay

## 2017-10-21 DIAGNOSIS — F339 Major depressive disorder, recurrent, unspecified: Secondary | ICD-10-CM | POA: Diagnosis not present

## 2017-10-21 DIAGNOSIS — F419 Anxiety disorder, unspecified: Secondary | ICD-10-CM | POA: Diagnosis not present

## 2017-10-21 DIAGNOSIS — J449 Chronic obstructive pulmonary disease, unspecified: Secondary | ICD-10-CM | POA: Diagnosis not present

## 2017-10-21 DIAGNOSIS — F332 Major depressive disorder, recurrent severe without psychotic features: Secondary | ICD-10-CM | POA: Diagnosis not present

## 2017-10-21 DIAGNOSIS — Z87828 Personal history of other (healed) physical injury and trauma: Secondary | ICD-10-CM | POA: Diagnosis not present

## 2017-10-21 DIAGNOSIS — X80XXXS Intentional self-harm by jumping from a high place, sequela: Secondary | ICD-10-CM | POA: Diagnosis present

## 2017-10-21 DIAGNOSIS — Z87891 Personal history of nicotine dependence: Secondary | ICD-10-CM | POA: Diagnosis not present

## 2017-10-21 DIAGNOSIS — Z818 Family history of other mental and behavioral disorders: Secondary | ICD-10-CM

## 2017-10-21 DIAGNOSIS — S14106S Unspecified injury at C6 level of cervical spinal cord, sequela: Secondary | ICD-10-CM | POA: Diagnosis not present

## 2017-10-21 DIAGNOSIS — R45851 Suicidal ideations: Secondary | ICD-10-CM | POA: Diagnosis present

## 2017-10-21 DIAGNOSIS — Z915 Personal history of self-harm: Secondary | ICD-10-CM | POA: Diagnosis not present

## 2017-10-21 DIAGNOSIS — G8220 Paraplegia, unspecified: Secondary | ICD-10-CM

## 2017-10-21 DIAGNOSIS — G709 Myoneural disorder, unspecified: Secondary | ICD-10-CM | POA: Diagnosis not present

## 2017-10-21 DIAGNOSIS — G4700 Insomnia, unspecified: Secondary | ICD-10-CM | POA: Diagnosis present

## 2017-10-21 DIAGNOSIS — R200 Anesthesia of skin: Secondary | ICD-10-CM

## 2017-10-21 DIAGNOSIS — F411 Generalized anxiety disorder: Secondary | ICD-10-CM | POA: Diagnosis present

## 2017-10-21 DIAGNOSIS — R4020 Unspecified coma: Secondary | ICD-10-CM | POA: Diagnosis not present

## 2017-10-21 DIAGNOSIS — Z811 Family history of alcohol abuse and dependence: Secondary | ICD-10-CM | POA: Diagnosis not present

## 2017-10-21 MED ORDER — QUETIAPINE FUMARATE 25 MG PO TABS
25.0000 mg | ORAL_TABLET | Freq: Two times a day (BID) | ORAL | Status: DC
  Administered 2017-10-22 – 2017-11-01 (×20): 25 mg via ORAL
  Filled 2017-10-21 (×20): qty 1

## 2017-10-21 MED ORDER — BUSPIRONE HCL 10 MG PO TABS: 10.0000 mg | ORAL_TABLET | Freq: Three times a day (TID) | ORAL | Status: DC

## 2017-10-21 MED ORDER — ONDANSETRON HCL 4 MG PO TABS: 4.0000 mg | ORAL_TABLET | Freq: Three times a day (TID) | ORAL | 0 refills | Status: DC | PRN

## 2017-10-21 MED ORDER — MAGNESIUM HYDROXIDE 400 MG/5ML PO SUSP: 30.0000 mL | Freq: Every day | ORAL | Status: DC | PRN

## 2017-10-21 MED ORDER — BUSPIRONE HCL 10 MG PO TABS
10.0000 mg | ORAL_TABLET | Freq: Three times a day (TID) | ORAL | Status: DC
  Administered 2017-10-22 – 2017-11-01 (×27): 10 mg via ORAL
  Filled 2017-10-21 (×33): qty 1

## 2017-10-21 MED ORDER — ALUM & MAG HYDROXIDE-SIMETH 200-200-20 MG/5ML PO SUSP: 30.0000 mL | ORAL | 0 refills | Status: DC | PRN

## 2017-10-21 MED ORDER — ACETAMINOPHEN 325 MG PO TABS
650.0000 mg | ORAL_TABLET | Freq: Four times a day (QID) | ORAL | Status: DC | PRN
  Administered 2017-10-24 – 2017-10-28 (×2): 650 mg via ORAL
  Filled 2017-10-21 (×2): qty 2

## 2017-10-21 MED ORDER — ALUM & MAG HYDROXIDE-SIMETH 200-200-20 MG/5ML PO SUSP: 30.0000 mL | ORAL | Status: DC | PRN

## 2017-10-21 MED ORDER — QUETIAPINE FUMARATE 25 MG PO TABS: 25.0000 mg | ORAL_TABLET | Freq: Two times a day (BID) | ORAL | Status: DC

## 2017-10-21 MED ORDER — MAGNESIUM HYDROXIDE 400 MG/5ML PO SUSP: 30.0000 mL | Freq: Every day | ORAL | 0 refills | Status: DC | PRN

## 2017-10-21 MED ORDER — TRAZODONE HCL 100 MG PO TABS
100.0000 mg | ORAL_TABLET | Freq: Every evening | ORAL | Status: DC | PRN
Start: 2017-10-21 — End: 2017-10-26
  Administered 2017-10-22 – 2017-10-25 (×4): 100 mg via ORAL
  Filled 2017-10-21 (×5): qty 1

## 2017-10-21 MED ORDER — DULOXETINE HCL 60 MG PO CPEP: 60.0000 mg | ORAL_CAPSULE | Freq: Every day | ORAL | 3 refills | Status: DC

## 2017-10-21 MED ORDER — CLONAZEPAM 0.5 MG PO TABS: 0.5000 mg | ORAL_TABLET | Freq: Two times a day (BID) | ORAL | 0 refills | Status: DC | PRN

## 2017-10-21 MED ORDER — CLONAZEPAM 0.5 MG PO TABS: 0.5000 mg | ORAL_TABLET | Freq: Two times a day (BID) | ORAL | Status: DC | PRN

## 2017-10-21 MED ORDER — DULOXETINE HCL 30 MG PO CPEP
60.0000 mg | ORAL_CAPSULE | Freq: Every day | ORAL | Status: DC
  Administered 2017-10-22 – 2017-11-01 (×11): 60 mg via ORAL
  Filled 2017-10-21 (×11): qty 2

## 2017-10-21 MED ORDER — TRAZODONE HCL 100 MG PO TABS: 100.0000 mg | ORAL_TABLET | Freq: Every evening | ORAL | Status: DC | PRN

## 2017-10-21 MED ORDER — ENSURE ENLIVE PO LIQD: 237.0000 mL | Freq: Two times a day (BID) | ORAL | 12 refills | Status: DC

## 2017-10-21 MED ORDER — ACETAMINOPHEN 325 MG PO TABS: 650.0000 mg | ORAL_TABLET | Freq: Four times a day (QID) | ORAL | Status: DC | PRN

## 2017-10-21 NOTE — Progress Notes (Signed)
Pt signed a voluntary consent form for [facility]. CSW [facility] via fax number [phone].   Jonathon JordanLynn B Gaspar Fowle, MSW, Theresia MajorsLCSWA [phone]

## 2017-10-21 NOTE — Progress Notes (Signed)
Recreation Therapy Notes  Date: 10/21/17 Time: 0930 Location: 300 Hall Group Room  Group Topic: Stress Management  Goal Area(s) Addresses:  Patient will verbalize importance of using healthy stress management.  Patient will identify positive emotions associated with healthy stress management.   Behavioral Response: Engaged  Intervention: Stress Management  Activity :  Progressive Muscle Relaxation.  LRT introduced the stress management technique of progressive muscle relaxation.  LRT lead the group the exercise of tensing and relaxing each muscle group individually.  Education:  Stress Management, Discharge Planning.   Education Outcome: Acknowledges edcuation/In group clarification offered/Needs additional education  Clinical Observations/Feedback: Pt attended group.   Claudia Alvizo, LRT/CTRS         Decker Cogdell A 10/21/2017 11:36 AM 

## 2017-10-21 NOTE — Progress Notes (Signed)
  [facility] :  Will you be returning to the same living situation after discharge:  No. [facility]. At discharge, do you have transportation home?: Yes,  [facility] will provide transport. Do you have the ability to pay for your medications: Yes,  pt has insurance.  Release of information consent forms completed and in the chart;  Patient's signature needed at discharge.  Patient to Follow up at: Follow-up Information    [facility] Follow up on 10/21/2017.   Why:  You have been accepted for ECT treatment with Dr. Toni Amendlapacs. [facility].  Contact information: [address] [place] [id number]          Next level of care provider has access to [facility] Link:yes  Safety Planning and Suicide Prevention discussed: Yes,  with pt.  Have you used any form of tobacco in the last 30 days? (Cigarettes, Smokeless Tobacco, Cigars, and/or Pipes): No  Has patient been referred to the Quitline?: N/A patient is not a smoker  Patient has been referred for addiction treatment: N/A  Jonathon JordanLynn B Ezella Kell, MSW, LCSWA 10/21/2017, 10:47 AM

## 2017-10-21 NOTE — Pre-Procedure Instructions (Signed)
This nurse spoke with the patient regarding receiving ECT on Monday. The patient stated that she had received ECT previously and was in agreement to receive ECT again. This nurse reviewed the process of ECT including not eating after midnight. The patient verbalized understanding and stated she had no questions for this nurse at this time.

## 2017-10-21 NOTE — BHH Group Notes (Signed)
LCSW Group Therapy Note  10/21/2017 1:15pm  Type of Therapy and Topic:  Group Therapy:  Feelings around Relapse and Recovery  Participation Level: Pt invited. Did not attend.    Jonathon JordanLynn B Camie Hauss, MSW, LCSWA 10/21/2017 2:09 PM

## 2017-10-21 NOTE — BHH Suicide Risk Assessment (Signed)
[facility]   Nursing information obtained from:   Review of chart and discussion with current nursing staff Demographic factors:   Female chronically ill living by herself history of severe mental illness past history of suicide attempts Current Mental Status:   Depressed suicidal but not psychotic and cooperative with treatment Loss Factors:   Chronic loss from severe physical injury or recent psychosocial loss from her mother moving into assisted living Historical Factors:   Patient has had several very serious suicide attempts Risk Reduction Factors:    Good insight and motivation for treatment and cooperative with treatment as well as a history of positive response to appropriate treatment  Total Time spent with patient: 1 hour Principal Problem: <principal problem not specified> Diagnosis:   Patient Active Problem List   Diagnosis Date Noted  . Severe recurrent major depression without psychotic features (HCC) [F33.2] 10/21/2017  . GAD (generalized anxiety disorder) [F41.1] 10/20/2017  . MDD (major depressive disorder), recurrent episode, severe (HCC) [F33.2] 10/18/2017  . Major depressive disorder, recurrent, severe without psychotic features (HCC) [F33.2]    Subjective Data: Patient is a 46 year old woman with severe chronic major depression who is [facility] so that we can do inpatient ECT.  Complains of depressed mood suicidal thoughts low motivation low energy poor sleep.  No psychotic symptoms no hallucinations.  Continued Clinical Symptoms:    The "Alcohol Use Disorders Identification Test", Guidelines for Use in Primary Care, Second Edition.  World Science writerHealth Organization [facility](WHO). Score between 0-7:  no or low risk or alcohol related problems. Score between 8-15:  moderate risk of alcohol related problems. Score between 16-19:  high risk of alcohol related problems. Score 20 or above:  warrants further diagnostic  evaluation for alcohol dependence and treatment.   CLINICAL FACTORS:   Depression:   Anhedonia Hopelessness Severe   Musculoskeletal: Strength & Muscle Tone: atrophy and Patient is a paraplegic secondary to a traumatic spinal cord injury. Gait & Station: unable to stand Patient leans: N/A  Psychiatric Specialty Exam: Physical Exam  Nursing note and vitals reviewed. Constitutional: She appears well-developed and well-nourished.  HENT:  Head: Normocephalic and atraumatic.  Eyes: Conjunctivae are normal. Pupils are equal, round, and reactive to light.  Neck: Normal range of motion.  Cardiovascular: Regular rhythm and normal heart sounds.  Respiratory: Effort normal and breath sounds normal. No respiratory distress.  GI: Soft.  Musculoskeletal: Normal range of motion.  Neurological: She is alert. She displays atrophy. She exhibits abnormal muscle tone.  Patient is status post traumatic injury to the spinal cord and is paraplegic and with limited use of the upper extremities as well.  This is a chronic condition.  Skin: Skin is warm and dry.  Psychiatric: Judgment normal. Her speech is delayed. She is slowed. Cognition and memory are normal. She exhibits a depressed mood. She expresses suicidal ideation. She expresses no suicidal plans.    Review of Systems  Constitutional: Negative.   HENT: Negative.   Eyes: Negative.   Respiratory: Negative.   Cardiovascular: Negative.   Gastrointestinal: Negative.   Musculoskeletal: Negative.   Skin: Negative.   Neurological: Positive for sensory change and focal weakness.  Psychiatric/Behavioral: Positive for depression and suicidal ideas. Negative for hallucinations, memory loss and substance abuse. The patient is not nervous/anxious and does not have insomnia.     Last menstrual period 09/29/2017.There is no height or weight on file to calculate BMI.  General Appearance: Casual  Eye Contact:  Fair  Speech:  Slow  Volume:  Decreased   Mood:  Depressed  Affect:  Constricted  Thought Process:  Goal Directed  Orientation:  Full (Time, Place, and Person)  Thought Content:  Logical  Suicidal Thoughts:  Yes.  without intent/plan  Homicidal Thoughts:  No  Memory:  Immediate;   Fair Recent;   Fair Remote;   Fair  Judgement:  Fair  Insight:  Fair  Psychomotor Activity:  Decreased  Concentration:  Concentration: Fair  Recall:  F[employer]Fair  Fund of Knowledge:  Fair  Language:  Fair  Akathisia:  No  Handed:  Right  AIMS (if indicated):     Assets:  Desire for Improvement Housing Resilience Social Support  ADL's:  Intact  Cognition:  WNL  Sleep:         COGNITIVE FEATURES THAT CONTRIBUTE TO RISK:  Thought constriction (tunnel vision)    SUICIDE RISK:   Severe:  Frequent, intense, and enduring suicidal ideation, specific plan, no subjective intent, but some objective markers of intent (i.e., choice of lethal method), the method is accessible, some limited preparatory behavior, evidence of impaired self-control, severe dysphoria/symptomatology, multiple risk factors present, and few if any protective factors, particularly a lack of social support.  PLAN OF CARE: Patient has been transferred to our hospital so that ECT can be employed as a modality for inpatient treatment.  For now [facility] and planning to begin ECT on Monday.  Patient agrees to plan.  The full workup in process.  Patient will be reassessed carefully and suicidal ideation constantly reassess before discharge  I certify that inpatient services furnished can reasonably be expected to improve the patient's condition.   Mordecai RasmussenJohn Clapacs, MD 10/21/2017, 6:24 PM

## 2017-10-21 NOTE — Tx Team (Addendum)
Initial Treatment Plan 10/21/2017 4:22 PM [name] ZOX:[id number]    PATIENT STRESSORS: Loss of mother to an assistant facility Other: mobility resulting in feeling alone and unworthy   PATIENT STRENGTHS: Ability for insight Capable of independent living Communication skills   PATIENT IDENTIFIED PROBLEMS: Depression  Lonely in the household  Passive suicidal thoughts  Continue ECT treatments               DISCHARGE CRITERIA:  Ability to meet basic life and health needs Improved stabilization in mood, thinking, and/or behavior Motivation to continue treatment in a less acute level of care  PRELIMINARY DISCHARGE PLAN: Return to previous living arrangement Outside social events and or day care programs  PATIENT/FAMILY INVOLVEMENT: This treatment plan has been presented to and reviewed with the patient, Natasha [name].  The patient have been given the opportunity to ask questions and make suggestions.  Rex KrasJoanne  Aydyn Testerman, RN 10/21/2017, 4:22 PM

## 2017-10-21 NOTE — Progress Notes (Signed)
[name], but very depressed. She stated having passive SI thoughts at the present time without a plan. She feels she can be safe here on the unit.  She feels lonely without a purpose in life.The feeling are related to her mobility disability and her mother recently transitioning to an assistant facility related to Parkinson Disease. She is a high falls risk and walks with a crutch after a cervical 6 fx. She requires assistance with her some of her ADL's . She is here for ECT treatments which was interrupted recently due to a hospitalization. She was oriented to her new environment and went for her xray and CT Scan. She is using a wheelchair on the unit.

## 2017-10-21 NOTE — Progress Notes (Signed)
D: Pt denies SI/HI/AV Hallucinations. Pt is pleasant and cooperative. Pt goal for today is to not to isolate self and interact with others more. A: Pt was offered support and encouragement. Pt was given scheduled medications. Pt was encourage to attend groups. Q 15 minute checks were done for safety.  R:Pt attends groups. Pt is taking medication. Pt has no complaints.Pt receptive to treatment and safety maintained on unit.

## 2017-10-21 NOTE — Discharge Summary (Signed)
Physician Discharge Summary Note  Patient:  Natasha [name] is an 46 y.o., female MRN:  [id number] DOB:  January 10, 1971 Patient phone:  [phone] (home)  Patient address:   [address] [place] [id number],  Total Time spent with patient: 20 minutes  Date of Admission:  10/18/2017 Date of Discharge: 10/21/17   Reason for Admission:  Worsening depression with SI  Principal Problem: MDD (major depressive disorder), recurrent episode, severe [facility](HCC) Discharge Diagnoses: Patient Active Problem List   Diagnosis Date Noted  . GAD (generalized anxiety disorder) [F41.1] 10/20/2017  . MDD (major depressive disorder), recurrent episode, severe (HCC) [F33.2] 10/18/2017  . Major depressive disorder, recurrent, severe without psychotic features (HCC) [F33.2]     Past Psychiatric History: Long history of depression. She has been tried on multiple combinations over the years. She has had numerous inpatient admission. [facility] She is being swapped from Lexapro to Duloxetine. She has been toleraing he swap well. Patient has recently been started on ECT. She reports very good response to ECT. She was scheduled to have one yesterday. Patient has his of multiple suicide attempts. She is disabled [facility]    Past Medical History:  Past Medical History:  Diagnosis Date  . C6 spinal cord injury (HCC)   . Depression     Past Surgical History:  Procedure Laterality Date  . BACK SURGERY    . NECK SURGERY     Family History:  Family History  Problem Relation Age of Onset  . Alcohol abuse Father   . Alcohol abuse Brother   . Depression Mother   . Alcohol abuse Mother    Family Psychiatric  History: Strong family history of unipolar depression and suicide   Social History:  Social History   Substance and Sexual Activity  Alcohol Use No  . Alcohol/week: 0.0 oz   Comment: Occasional use     Social History    Substance and Sexual Activity  Drug Use No    Social History   Socioeconomic History  . Marital status: Single    Spouse name: None  . Number of children: None  . Years of education: None  . Highest education level: None  Social Needs  . Financial resource strain: None  . Food insecurity - worry: None  . Food insecurity - inability: None  . Transportation needs - medical: None  . Transportation needs - non-medical: None  Occupational History  . None  Tobacco Use  . Smoking status: Former Games developermoker  . Smokeless tobacco: Never Used  . Tobacco comment: No smoking hx; no need for cessation materials  Substance and Sexual Activity  . Alcohol use: No    Alcohol/week: 0.0 oz    Comment: Occasional use  . Drug use: No  . Sexual activity: No  Other Topics Concern  . None  Social History Narrative  . None    Hospital Course[facility]   [facility] Assessment: 46 y.o Caucasian female, single, lives alone, disabled. Background history of treatment resistant depression. Currently on ECT and pharmacotherapy. Presented to the unit in company of a friend. Reports increasing depression and suicidal behavior. Overdosed twice on Trazodone at home. Continues to express death wish. Main stressor as recent separation from her mother who has moved into an ALF.  Routine labs are essentially normal. No substance detected.   At interview, patient reports being lonely at home. Says she is not able to cope. She  has been depending a lot on her friends and her brother. Says she is worried she might alienate them after a while. Says is sleeping a lot. She is listless. Says normally she is not able to do a whole lot but it has gotten worse. She feels isolated as she not driving. She has been trying to adjust to mom moving away. Says her mom is also struggling to adjust. Patient has been ruminating a lot. Says she has become more worried with the holidays around the corner. Says they used to celebrate at her mom's  place. A friend has stepped up and plans to host her this holiday. Patient is also worried about her housing situation. Her brother is paying the mortgage currently. Eventually the house would be sold. Patient has been considering an apartment in [place] that is equipped for handicapped people. She is open to home health aide in the meantime. She would be willing to get into partial hospitalization if transportation is taken care of. We brainstormed around going into a group home. Patient wants to think about it. Patient tells me that she really did not want to kill herself. Says she just wanted to numb herself. No final acts. Says she has always been impulsive. She felt that if she does not come into a structured environment, she might eventually harm herself. No evidence of psychosis. No evidence of mania. No substance use. No access to weapons. No violent or homicidal thoughts. Patient has good insight and wants to get better.   Patient has remained on the [facility] unit for 2 days and is partially stabilizing with medications. Patient has been pleasant and cooperative. She has was receiving ECT treatment prior to admission through [facility] and patient has been accepted at [facility] to continue ECT treatment. Patient will transferred to [facility] today. Patient will continue current medications. Her Lexapro has been stopped and she is taking Cymbalta 60 mg Daily.     Physical Findings: AIMS: Facial and Oral Movements Muscles of Facial Expression: None, normal Lips and Perioral Area: None, normal Jaw: None, normal Tongue: None, normal,Extremity Movements Upper (arms, wrists, hands, fingers): None, normal Lower (legs, knees, ankles, toes): None, normal, Trunk Movements Neck, shoulders, hips: None, normal, Overall Severity Severity of abnormal movements (highest score from questions above): None, normal Incapacitation due to abnormal movements: None, normal Patient's awareness of abnormal movements (rate only  patient's report): No Awareness, Dental Status Current problems with teeth and/or dentures?: No Does patient usually wear dentures?: No  CIWA:    COWS:     Musculoskeletal: Strength & Muscle Tone: abnormal Gait & Station: unsteady Patient leans: N/A  Psychiatric Specialty Exam: Physical Exam  Nursing note and vitals reviewed. Constitutional: She is oriented to person, place, and time. She appears well-developed.  Cardiovascular: Normal rate.  Respiratory: Effort normal.  Neurological: She is alert and oriented to person, place, and time.  Skin: Skin is warm.    Review of Systems  Constitutional: Negative.   HENT: Negative.   Eyes: Negative.   Respiratory: Negative.   Cardiovascular: Negative.   Gastrointestinal: Negative.   Genitourinary: Negative.   Skin: Negative.   Neurological: Negative.   Endo/Heme/Allergies: Negative.   Psychiatric/Behavioral: Positive for depression. Negative for hallucinations and substance abuse. The patient is nervous/anxious.     Blood pressure 115/73, pulse 95, temperature 98.7 F (37.1 C), temperature source Oral, resp. rate 16, height 5' (1.524 m), weight 54.4 kg (120 lb), last menstrual period 09/29/2017.Body mass index is 23.44 kg/m.  General  Appearance: Casual  Eye Contact:  Good  Speech:  Clear and Coherent and Normal Rate  Volume:  Normal  Mood:  Depressed  Affect:  Flat  Thought Process:  Goal Directed and Descriptions of Associations: Intact  Orientation:  Full (Time, Place, and Person)  Thought Content:  WDL  Suicidal Thoughts:  No  Homicidal Thoughts:  No  Memory:  Immediate;   Good Recent;   Good Remote;   Good  Judgement:  Fair  Insight:  Good  Psychomotor Activity:  Decreased  Concentration:  Concentration: Good and Attention Span: Good  Recall:  Good  Fund of Knowledge:  Good  Language:  Good  Akathisia:  No  Handed:  Right  AIMS (if indicated):     Assets:  Communication Skills Desire for Improvement Financial  Resources/Insurance Housing Social Support Transportation  ADL's:  Intact  Cognition:  WNL  Sleep:  Number of Hours: 5     Have you used any form of tobacco in the last 30 days? (Cigarettes, Smokeless Tobacco, Cigars, and/or Pipes): No  Has this patient used any form of tobacco in the last 30 days? (Cigarettes, Smokeless Tobacco, Cigars, and/or Pipes) Yes, No  Blood Alcohol level:  Lab Results  Component Value Date   ETH <10 10/18/2017   ETH <5 08/15/2017    Metabolic Disorder Labs:  Lab Results  Component Value Date   HGBA1C 4.7 (L) 02/03/2017   MPG 88 02/03/2017   MPG 108 10/31/2015   Lab Results  Component Value Date   PROLACTIN 121.9 (H) 02/03/2017   Lab Results  Component Value Date   CHOL 214 (H) 02/03/2017   TRIG 117 02/03/2017   HDL 42 02/03/2017   CHOLHDL 5.1 02/03/2017   VLDL 23 02/03/2017   LDLCALC 149 (H) 02/03/2017    See Psychiatric Specialty Exam and Suicide Risk Assessment completed by Attending Physician prior to discharge.  Discharge destination:  Other:  [facility]  Is patient on multiple antipsychotic therapies at discharge:  No   Has Patient had three or more failed trials of antipsychotic monotherapy by history:  No  Recommended Plan for Multiple Antipsychotic Therapies: NA   Allergies as of 10/21/2017   No Known Allergies     Medication List    STOP taking these medications   escitalopram 20 MG tablet Commonly known as:  LEXAPRO     TAKE these medications     Indication  acetaminophen 325 MG tablet Commonly known as:  TYLENOL Take 2 tablets (650 mg total) every 6 (six) hours as needed by mouth for mild pain.  Indication:  Pain   alum & mag hydroxide-simeth 200-200-20 MG/5ML suspension Commonly known as:  MAALOX/MYLANTA Take 30 mLs every 4 (four) hours as needed by mouth for indigestion.  Indication:  Acid Indigestion   busPIRone 10 MG tablet Commonly known as:  BUSPAR Take 1 tablet (10 mg total)  3 (three) times daily by mouth.  Indication:  mood stability   clonazePAM 0.5 MG tablet Commonly known as:  KLONOPIN Take 1 tablet (0.5 mg total) 2 (two) times daily as needed by mouth (anxiety). What changed:  reasons to take this  Indication:  Severe anxiety   DULoxetine 60 MG capsule Commonly known as:  CYMBALTA Take 1 capsule (60 mg total) daily by mouth. Start taking on:  10/22/2017 What changed:    medication strength  how much to take  how to take this  when to take this  additional instructions  Indication:  mood stability   feeding supplement (ENSURE ENLIVE) Liqd Take 237 mLs 2 (two) times daily between meals by mouth.  Indication:  poor appetite   magnesium hydroxide 400 MG/5ML suspension Commonly known as:  MILK OF MAGNESIA Take 30 mLs daily as needed by mouth for mild constipation.  Indication:  constipation   ondansetron 4 MG tablet Commonly known as:  ZOFRAN Take 1 tablet (4 mg total) every 8 (eight) hours as needed by mouth for nausea or vomiting.  Indication:  nausea and vomiting   QUEtiapine 25 MG tablet Commonly known as:  SEROQUEL Take 1 tablet (25 mg total) 2 (two) times daily by mouth.  Indication:  mood stability   traZODone 100 MG tablet Commonly known as:  DESYREL Take 1 tablet (100 mg total) at bedtime as needed by mouth for sleep.  Indication:  Trouble Sleeping        Follow-up recommendations:  Continue activity as tolerated. Continue diet as recommended by your PCP. Ensure to keep all appointments with outpatient providers.  Comments:  Patient is instructed prior to discharge to: Take all medications as prescribed by his/her mental healthcare provider. Report any adverse effects and or reactions from the medicines to his/her outpatient provider promptly. Patient has been instructed & cautioned: To not engage in alcohol and or illegal drug use while on prescription medicines. In the event of worsening symptoms, patient is  instructed to call the crisis hotline, 911 and or go to the nearest ED for appropriate evaluation and treatment of symptoms. To follow-up with his/her primary care provider for your other medical issues, concerns and or health care needs.    Signed: Gerlene Burdock Kristiane Morsch, FNP 10/21/2017, 10:08 AM

## 2017-10-21 NOTE — Progress Notes (Signed)
Patient ID: Melven SartoriusJenifer K Moren, female   DOB: [birth year], 46 y.o.   MRN: [id number] PER STATE REGULATIONS 482.30  THIS CHART WAS REVIEWED FOR MEDICAL NECESSITY WITH RESPECT TO THE PATIENT'S ADMISSION/DURATION OF STAY.  NEXT REVIEW DATE: 10/23/12/18  Loura HaltBARBARA Dearis Danis, RN, BSN CASE MANAGER

## 2017-10-21 NOTE — Tx Team (Signed)
Interdisciplinary Treatment and Diagnostic Plan Update 10/21/2017 Time of Session: 9:30am  Natasha [name]  MRN: [id number]  Principal Diagnosis: MDD (major depressive disorder), recurrent episode, severe (HCC)  Secondary Diagnoses: Principal Problem:   MDD (major depressive disorder), recurrent episode, severe (HCC) Active Problems:   GAD (generalized anxiety disorder)   Current Medications:  Current Facility-Administered Medications  Medication Dose Route Frequency Provider Last Rate Last Dose  . acetaminophen (TYLENOL) tablet 650 mg  650 mg Oral Q6H PRN Laveda AbbeParks, Laurie Britton, NP   650 mg at 10/18/17 1812  . alum & mag hydroxide-simeth (MAALOX/MYLANTA) 200-200-20 MG/5ML suspension 30 mL  30 mL Oral Q4H PRN Laveda AbbeParks, Laurie Britton, NP      . busPIRone (BUSPAR) tablet 10 mg  10 mg Oral TID Laveda AbbeParks, Laurie Britton, NP   10 mg at 10/21/17 0933  . clonazePAM (KLONOPIN) tablet 0.5 mg  0.5 mg Oral BID PRN Laveda AbbeParks, Laurie Britton, NP      . DULoxetine (CYMBALTA) DR capsule 60 mg  60 mg Oral Daily Izediuno, Delight OvensVincent A, MD   60 mg at 10/21/17 0932  . feeding supplement (ENSURE ENLIVE) (ENSURE ENLIVE) liquid 237 mL  237 mL Oral BID BM Cobos, Fernando A, MD      . magnesium hydroxide (MILK OF MAGNESIA) suspension 30 mL  30 mL Oral Daily PRN Laveda AbbeParks, Laurie Britton, NP      . ondansetron [facility](ZOFRAN) tablet 4 mg  4 mg Oral Q8H PRN Laveda AbbeParks, Laurie Britton, NP      . QUEtiapine (SEROQUEL) tablet 25 mg  25 mg Oral BID Laveda AbbeParks, Laurie Britton, NP   25 mg at 10/21/17 0933  . traZODone (DESYREL) tablet 100 mg  100 mg Oral QHS PRN Laveda AbbeParks, Laurie Britton, NP   100 mg at 10/20/17 2143    PTA Medications: Medications Prior to Admission  Medication Sig Dispense Refill Last Dose  . busPIRone (BUSPAR) 10 MG tablet Take 1 tablet (10 mg total) by mouth 3 (three) times daily. 90 tablet 0 10/16/2017  . clonazePAM (KLONOPIN) 0.5 MG tablet Take 1 tablet (0.5 mg total) by mouth 2 (two) times daily as needed for anxiety. 60 tablet 0  10/16/2017  . DULoxetine (CYMBALTA) 30 MG capsule 30 mg daily for one week, then 60 mg daily 90 capsule 0 has not started  . escitalopram (LEXAPRO) 20 MG tablet Take 1.5 tablets (30 mg total) by mouth daily. 45 tablet 0 Past Week at Unknown time  . QUEtiapine (SEROQUEL) 25 MG tablet Take 1 tablet (25 mg total) by mouth 2 (two) times daily. 60 tablet 0 10/16/2017  . traZODone (DESYREL) 100 MG tablet Take 100 mg by mouth at bedtime as needed for sleep.   0 10/16/2017    Treatment Modalities: Medication Management, Group therapy, Case management,  1 to 1 session with clinician, Psychoeducation, Recreational therapy.  Patient Stressors: Financial difficulties Medication change or noncompliance Other: Mother recently moved to Assisted Living Patient Strengths: Ability for insight Average or above average intelligence Communication skills Supportive family/friends  Physician Treatment Plan for Primary Diagnosis: MDD (major depressive disorder), recurrent episode, severe (HCC) Long Term Goal(s): Improvement in symptoms so as ready for discharge Short Term Goals: Ability to identify changes in lifestyle to reduce recurrence of condition will improve Ability to verbalize feelings will improve Ability to disclose and discuss suicidal ideas Ability to demonstrate self-control will improve Ability to identify and develop effective coping behaviors will improve Ability to maintain clinical measurements within normal limits will improve Compliance with prescribed medications  will improve Ability to identify changes in lifestyle to reduce recurrence of condition will improve Ability to verbalize feelings will improve Ability to disclose and discuss suicidal ideas Ability to demonstrate self-control will improve Ability to identify and develop effective coping behaviors will improve Ability to maintain clinical measurements within normal limits will improve Compliance with prescribed medications will  improve  Medication Management: Evaluate patient's response, side effects, and tolerance of medication regimen.  Therapeutic Interventions: 1 to 1 sessions, Unit Group sessions and Medication administration.  Evaluation of Outcomes: Adequate for Discharge  Physician Treatment Plan for Secondary Diagnosis: Principal Problem:   MDD (major depressive disorder), recurrent episode, severe (HCC) Active Problems:   GAD (generalized anxiety disorder)  Long Term Goal(s): Improvement in symptoms so as ready for discharge  Short Term Goals: Ability to identify changes in lifestyle to reduce recurrence of condition will improve Ability to verbalize feelings will improve Ability to disclose and discuss suicidal ideas Ability to demonstrate self-control will improve Ability to identify and develop effective coping behaviors will improve Ability to maintain clinical measurements within normal limits will improve Compliance with prescribed medications will improve Ability to identify changes in lifestyle to reduce recurrence of condition will improve Ability to verbalize feelings will improve Ability to disclose and discuss suicidal ideas Ability to demonstrate self-control will improve Ability to identify and develop effective coping behaviors will improve Ability to maintain clinical measurements within normal limits will improve Compliance with prescribed medications will improve  Medication Management: Evaluate patient's response, side effects, and tolerance of medication regimen.  Therapeutic Interventions: 1 to 1 sessions, Unit Group sessions and Medication administration.  Evaluation of Outcomes: Adequate for Discharge  RN Treatment Plan for Primary Diagnosis: MDD (major depressive disorder), recurrent episode, severe (HCC) Long Term Goal(s): Knowledge of disease and therapeutic regimen to maintain health will improve  Short Term Goals: Compliance with prescribed medications will  improve  Medication Management: RN will administer medications as ordered by provider, will assess and evaluate patient's response and provide education to patient for prescribed medication. RN will report any adverse and/or side effects to prescribing provider.  Therapeutic Interventions: 1 on 1 counseling sessions, Psychoeducation, Medication administration, Evaluate responses to treatment, Monitor vital signs and CBGs as ordered, Perform/monitor CIWA, COWS, AIMS and Fall Risk screenings as ordered, Perform wound care treatments as ordered.  Evaluation of Outcomes: Adequate for Discharge  LCSW Treatment Plan for Primary Diagnosis: MDD (major depressive disorder), recurrent episode, severe (HCC) Long Term Goal(s): Safe transition to appropriate next level of care at discharge, Engage patient in therapeutic group addressing interpersonal concerns. Short Term Goals: Engage patient in aftercare planning with referrals and resources, Facilitate patient progression through stages of change regarding substance use diagnoses and concerns, Identify triggers associated with mental health/substance abuse issues and Increase skills for wellness and recovery  Therapeutic Interventions: Assess for all discharge needs, 1 to 1 time with Social worker, Explore available resources and support systems, Assess for adequacy in community support network, Educate family and significant other(s) on suicide prevention, Complete Psychosocial Assessment, Interpersonal group therapy.  Evaluation of Outcomes: Adequate for Discharge  Progress in Treatment: Attending groups: Yes Participating in groups: Yes Taking medication as prescribed: Yes, MD continues to assess for medication changes as needed Toleration medication: Yes, no side effects reported at this time Family/Significant other contact made: No, pt declined consent. Patient understands diagnosis: Yes, AEB pt's willingness to participate in treat, Discussing  patient identified problems/goals with staff: Yes Medical problems stabilized or resolved: Yes  Denies suicidal/homicidal ideation: No, pt still endorses passive SI. Issues/concerns per patient self-inventory: None Other: N/A  New problem(s) identified: None identified at this time.   New Short Term/Long Term Goal(s): None identified at this time.   Discharge Plan or Barriers: [facility] for E[facility] treatment with D. Clapacs.  Reason for Continuation of Hospitalization:  None identified at this time   Estimated Length of Stay: 0 days; [facility]17  Attendees: Patient: 10/21/2017 11:48 AM  Physician: Dr. Jackquline BerlinIzediuno 10/21/2017 11:48 AM  Nursing: Alexia FreestonePatty, RN; Boyd KerbsPenny, RN 10/21/2017 11:48 AM  RN Care Manager: Onnie BoerJennifer [name], RN 10/21/2017 11:48 AM  Social Worker: Donnelly StagerLynn Justa Hatchell, LCSWA 10/21/2017 11:48 AM  Recreational Therapist:  10/21/2017 11:48 AM  Other: Armandina StammerAgnes Nwoko, NP 10/21/2017 11:48 AM  Other:  10/21/2017 11:48 AM  Other: 10/21/2017 11:48 AM  Scribe for Treatment Team: Jonathon JordanLynn B Nneka Blanda, MSW,LCSWA 10/21/2017 11:48 AM'

## 2017-10-21 NOTE — H&P (Signed)
Psychiatric Admission Assessment Adult  Patient Identification: Natasha [name] MRN:  [id number] Date of Evaluation:  10/21/2017 Chief Complaint:  depression Principal Diagnosis: Severe recurrent major depression without psychotic features ([place]) Diagnosis:   Patient Active Problem List   Diagnosis Date Noted  . Severe recurrent major depression without psychotic features ([place]) [F33.2] 10/21/2017  . Paraplegia ([place]) [G82.20] 10/21/2017  . GAD (generalized anxiety disorder) [F41.1] 10/20/2017  . MDD (major depressive disorder), recurrent episode, severe ([place]) [F33.2] 10/18/2017  . Major depressive disorder, recurrent, severe without psychotic features ([place]) [F33.2]    History of Present Illness: 46 year old woman with a history of severe recurrent depression transferred from [facility].  She was admitted there several days ago because of worsening depression with severely depressed mood, hopelessness, lack of motivation, poor sleep and worsening suicidal ideation with concern about suicidal behavior.  The patient has partially stabilized but continues to have suicidal thoughts.  She has a positive history of good response to ECT treatment in the past.  Patient has multiple outpatient stresses mostly that she is currently living by herself and no longer has her mother with her.  Also chronic stress from illness. Associated Signs/Symptoms: Depression Symptoms:  depressed mood, anhedonia, insomnia, difficulty concentrating, hopelessness, suicidal thoughts without plan, (Hypo) Manic Symptoms:  None Anxiety Symptoms:  Excessive Worry, Psychotic Symptoms:  None PTSD Symptoms: Negative Total Time spent with patient: 1 hour  Past Psychiatric History: Patient has a long history of depression going back decades.  Multiple suicide attempts in the past.  Most notably a suicide attempt by jumping from a height that resulted in spinal fracture with resultant paraplegia.  Multiple  antidepressants have been tried with intermittent partial response.  More recently illness has been bad since her mother moved into assisted living.  Patient has been receiving outpatient ECT at [facility] with good response for much of the last year.  Is the patient at risk to self? Yes.    Has the patient been a risk to self in the past 6 months? Yes.    Has the patient been a risk to self within the distant past? Yes.    Is the patient a risk to others? No.  Has the patient been a risk to others in the past 6 months? No.  Has the patient been a risk to others within the distant past? No.   Prior Inpatient Therapy:   Prior Outpatient Therapy:    Alcohol Screening:   Substance Abuse History in the last 12 months:  No. Consequences of Substance Abuse: Negative Previous Psychotropic Medications: Yes  Psychological Evaluations: Yes  Past Medical History:  Past Medical History:  Diagnosis Date  . C6 spinal cord injury ([place])   . Depression     Past Surgical History:  Procedure Laterality Date  . BACK SURGERY    . NECK SURGERY     Family History:  Family History  Problem Relation Age of Onset  . Alcohol abuse Father   . Alcohol abuse Brother   . Depression Mother   . Alcohol abuse Mother    Family Psychiatric  History: None noted Tobacco Screening:   Social History:  Social History   Substance and Sexual Activity  Alcohol Use No  . Alcohol/week: 0.0 oz   Comment: Occasional use     Social History   Substance and Sexual Activity  Drug Use No    Additional Social History:  Allergies:  No Known Allergies Lab Results: No results found for this or any previous visit (from the past 48 hour(s)).  Blood Alcohol level:  Lab Results  Component Value Date   ETH <10 10/18/2017   ETH <5 54/00/8676    Metabolic Disorder Labs:  Lab Results  Component Value Date   HGBA1C 4.7 (L) 02/03/2017   MPG 88 02/03/2017   MPG 108  10/31/2015   Lab Results  Component Value Date   PROLACTIN 121.9 (H) 02/03/2017   Lab Results  Component Value Date   CHOL 214 (H) 02/03/2017   TRIG 117 02/03/2017   HDL 42 02/03/2017   CHOLHDL 5.1 02/03/2017   VLDL 23 02/03/2017   LDLCALC 149 (H) 02/03/2017    Current Medications: Current Facility-Administered Medications  Medication Dose Route Frequency Provider Last Rate Last Dose  . acetaminophen (TYLENOL) tablet 650 mg  650 mg Oral Q6H PRN ,  T, MD      . alum & mag hydroxide-simeth (MAALOX/MYLANTA) 200-200-20 MG/5ML suspension 30 mL  30 mL Oral Q4H PRN ,  T, MD      . busPIRone (BUSPAR) tablet 10 mg  10 mg Oral TID ,  T, MD      . clonazePAM (KLONOPIN) tablet 0.5 mg  0.5 mg Oral BID PRN ,  T, MD      . DULoxetine (CYMBALTA) DR capsule 60 mg  60 mg Oral Daily ,  T, MD      . magnesium hydroxide (MILK OF MAGNESIA) suspension 30 mL  30 mL Oral Daily PRN ,  T, MD      . QUEtiapine (SEROQUEL) tablet 25 mg  25 mg Oral BID ,  T, MD      . traZODone (DESYREL) tablet 100 mg  100 mg Oral QHS PRN , Madie Reno, MD       PTA Medications: Medications Prior to Admission  Medication Sig Dispense Refill Last Dose  . acetaminophen (TYLENOL) 325 MG tablet Take 2 tablets (650 mg total) every 6 (six) hours as needed by mouth for mild pain.     [name] Kitchen alum & mag hydroxide-simeth (MAALOX/MYLANTA) 200-200-20 MG/5ML suspension Take 30 mLs every 4 (four) hours as needed by mouth for indigestion. 355 mL 0   . busPIRone (BUSPAR) 10 MG tablet Take 1 tablet (10 mg total) 3 (three) times daily by mouth.     . clonazePAM (KLONOPIN) 0.5 MG tablet Take 1 tablet (0.5 mg total) 2 (two) times daily as needed by mouth (anxiety). 30 tablet 0   . [START ON 10/22/2017] DULoxetine (CYMBALTA) 60 MG capsule Take 1 capsule (60 mg total) daily by mouth.  3   . feeding supplement, ENSURE ENLIVE, (ENSURE ENLIVE) LIQD Take 237 mLs 2 (two) times  daily between meals by mouth. 237 mL 12   . magnesium hydroxide (MILK OF MAGNESIA) 400 MG/5ML suspension Take 30 mLs daily as needed by mouth for mild constipation. 360 mL 0   . ondansetron (ZOFRAN) 4 MG tablet Take 1 tablet (4 mg total) every 8 (eight) hours as needed by mouth for nausea or vomiting. 20 tablet 0   . QUEtiapine (SEROQUEL) 25 MG tablet Take 1 tablet (25 mg total) 2 (two) times daily by mouth.     . traZODone (DESYREL) 100 MG tablet Take 1 tablet (100 mg total) at bedtime as needed by mouth for sleep.       Musculoskeletal: Strength & Muscle Tone: decreased Gait & Station: unable to stand Patient leans: N/A  Psychiatric Specialty Exam: Physical Exam  Nursing note and vitals reviewed. Constitutional: She appears well-developed and well-nourished.  HENT:  Head: Normocephalic and atraumatic.  Eyes: Conjunctivae are normal. Pupils are equal, round, and reactive to light.  Neck: Normal range of motion.  Cardiovascular: Normal heart sounds.  Respiratory: Effort normal.  GI: Soft.  Musculoskeletal: Normal range of motion.  Neurological: She is alert.  Paraplegic with limited use of some upper extremity function as well  Skin: Skin is warm and dry.  Psychiatric: Judgment normal. Her affect is blunt. Her speech is delayed. She is slowed. Cognition and memory are normal. Cognition and memory are not impaired. She exhibits a depressed mood. She expresses suicidal ideation. She expresses no suicidal plans.    Review of Systems  Constitutional: Negative.   HENT: Negative.   Eyes: Negative.   Respiratory: Negative.   Cardiovascular: Negative.   Gastrointestinal: Negative.   Musculoskeletal: Negative.   Skin: Negative.   Neurological: Negative.   Psychiatric/Behavioral: Positive for depression and suicidal ideas. Negative for hallucinations, memory loss and substance abuse. The patient is nervous/anxious and has insomnia.     Last menstrual period 09/29/2017.There is no  height or weight on file to calculate BMI.  General Appearance: Casual  Eye Contact:  Fair  Speech:  Slow  Volume:  Decreased  Mood:  Depressed  Affect:  Congruent  Thought Process:  Goal Directed  Orientation:  Full (Time, Place, and Person)  Thought Content:  Logical  Suicidal Thoughts:  Yes.  without intent/plan  Homicidal Thoughts:  No  Memory:  Immediate;   Fair Recent;   Fair Remote;   Fair  Judgement:  Fair  Insight:  Fair  Psychomotor Activity:  Decreased  Concentration:  Concentration: Fair  Recall:  [employer] of Knowledge:  Fair  Language:  Fair  Akathisia:  No  Handed:  Right  AIMS (if indicated):     Assets:  Communication Skills Desire for Improvement Housing Resilience Social Support  ADL's:  Intact  Cognition:  WNL  Sleep:       Treatment Plan Summary: Daily contact with patient to assess and evaluate symptoms and progress in treatment, Medication management and Plan Medications will be for the time being maintained at the same levels that she was at at behavioral [facility].  Workup being completed with planned ECT treatment to begin by Monday morning.  Patient has met with me and met with the ECT nurses and is agreeable to the treatment plan.  Continue daily assessment of mood and inclusion in groups and activities.  Observation Level/Precautions:  15 minute checks  Laboratory:  UA  Psychotherapy:    Medications:    Consultations:    Discharge Concerns:    Estimated LOS:  Other:     Physician Treatment Plan for Primary Diagnosis: Severe recurrent major depression without psychotic features ([place]) Long Term Goal(s): Improvement in symptoms so as ready for discharge  Short Term Goals: Ability to verbalize feelings will improve and Ability to disclose and discuss suicidal ideas  Physician Treatment Plan for Secondary Diagnosis: Principal Problem:   Severe recurrent major depression without psychotic features  ([place]) Active Problems:   Paraplegia ([place])  Long Term Goal(s): Improvement in symptoms so as ready for discharge  Short Term Goals: Ability to maintain clinical measurements within normal limits will improve and Compliance with prescribed medications will improve  I certify that inpatient services furnished can reasonably be expected to improve the patient's condition.  Alethia Berthold, MD 11/9/20186:32 PM

## 2017-10-21 NOTE — [facility] Suicide Risk Assessment (Signed)
[facility]   Principal Problem: MDD (major depressive disorder), recurrent episode, severe (HCC) Discharge Diagnoses:  Patient Active Problem List   Diagnosis Date Noted  . GAD (generalized anxiety disorder) [F41.1] 10/20/2017  . MDD (major depressive disorder), recurrent episode, severe (HCC) [F33.2] 10/18/2017  . Major depressive disorder, recurrent, severe without psychotic features (HCC) [F33.2]     Total Time spent with patient: 30 minutes  Musculoskeletal: Strength & Muscle Tone: decreased Gait & Station: In a wheelchair Patient leans: N/A  Psychiatric Specialty Exam: Review of Systems  Constitutional: Negative.   HENT: Negative.   Eyes: Negative.   Respiratory: Negative.   Cardiovascular: Negative.   Gastrointestinal: Negative.   Genitourinary: Negative.   Musculoskeletal: Negative.   Skin: Negative.   Neurological: Negative.   Endo/Heme/Allergies: Negative.     Blood pressure 115/73, pulse 95, temperature 98.7 F (37.1 C), temperature source Oral, resp. rate 16, height 5' (1.524 m), weight 54.4 kg (120 lb), last menstrual period 09/29/2017.Body mass index is 23.44 kg/m.  General Appearance: Casually dressed, calm and cooperative.   Eye Contact::  Good  Speech:  Spontaneous  Volume:  Decreased  Mood:  Depressed  Affect:  Restricted.   Thought Process:  Linear  Orientation:  Full (Time, Place, and Person)  Thought Content:  Worried about the future.   Suicidal Thoughts:  No  Homicidal Thoughts:  No  Memory:  Did not assess today.   Judgement:  Good  Insight:  Good  Psychomotor Activity:  Decreased  Concentration:  Good  Recall:  Good  Fund of Knowledge:Good  Language: Good  Akathisia:  Negative  Handed:    AIMS (if indicated):     Assets:  Communication Skills Desire for Improvement Resilience  Sleep:  Number of Hours: 5  Cognition: WNL  ADL's:  Intact   Clinical Assessment::   46 y.o Caucasian female, single, lives  alone, disabled. Background history of treatment resistant depression. Currently on ECT and pharmacotherapy. Presented to the unit in company of a friend. Reports increasing depression and suicidal behavior. Overdosed twice on Trazodone at home. Continues to express death wish. Main stressor as recent separation from her mother who has moved into an ALF.  Routine labs are essentially normal. No substance detected.    Seen today. Feels marginally better. Worried about continuity of care. Hopes social interventions that was considered would be further explored. No death wish. No evidence of psychosis or mania. She has been tolerating her medications. Nursing staff reports that she has been cooperative. She came out for groups today.    Demographic Factors:  Low socioeconomic status, Living alone and Unemployed  Loss Factors: Loss of significant relationship, Decline in physical health and Financial problems/change in socioeconomic status  Historical Factors: Prior suicide attempts and Impulsivity  Risk Reduction Factors:   Cooperative with care, on ECT, responsibility to family  Continued Clinical Symptoms:  Depression is lifting  Cognitive Features That Contribute To Risk:  None    Suicide Risk:  Moderate:  Frequent suicidal ideation with limited intensity, and duration, some specificity in terms of plans, no associated intent, good self-control, limited dysphoria/symptomatology, some risk factors present, and identifiable protective factors, including available and accessible social support.  Follow-up Information    [facility] on 10/21/2017.   Why:  You have been accepted for ECT treatment with Dr. Toni Amendlapacs. [facility].  Contact information: [address] [place] [id number]  Plan Of Care/Follow-up recommendations:  To be determined by accepting facility.   Georgiann CockerVincent A Izediuno,  MD 10/21/2017, 12:42 PM

## 2017-10-21 NOTE — [facility] Suicide Risk Assessment (Signed)
[facility] INPATIENT:  Family/Significant Other Suicide Prevention Education  Suicide Prevention Education:  Patient Discharged to Other Healthcare Facility:  Suicide Prevention Education Not Provided: {PT. DISCHARGED TO OTHER HEALTHCARE FACILITY:SUICIDE PREVENTION EDUCATION NOT PROVIDED ([facility]):  The patient is discharging to another healthcare facility for continuation of treatment.  The patient's medical information, including suicide ideations and risk factors, are a part of the medical information shared with the receiving healthcare facility.  [facility] for ECT treatment with Dr. Toni Amendlapacs.  Jonathon JordanLynn B Maham Quintin, MSW, LCSWA  10/21/2017, 10:48 AM

## 2017-10-22 NOTE — [facility] Group Notes (Signed)
[facility] Group Notes:  (Nursing/MHT/Case Management/Adjunct)  Date:  10/22/2017  Time:  5:50 AM  Type of Therapy:  Psychoeducational Skills  Participation Level:  Active  Participation Quality:  Appropriate, Attentive and Sharing  Affect:  Appropriate  Cognitive:  Appropriate  Insight:  Appropriate and Good  Engagement in Group:  Engaged  Modes of Intervention:  Discussion, Socialization and Support  Summary of Progress/Problems:  [name] 10/22/2017, 5:50 AM

## 2017-10-22 NOTE — Plan of Care (Signed)
Patient alert and oriented. Transfers to wheelchair. She is pleasant and cooperative. She denies hi, avh. But has passive si no plan. And contracts for safety.

## 2017-10-22 NOTE — [facility] Group Notes (Signed)
10/22/2017 1:15pm  Type of Therapy/Topic:  Group Therapy:  Balance in Life  Participation Level:  Patient was invited to group discussion, did not attend.    Rajvir Ernster  CUEBAS-COLON, LCSW-A 10/22/2017 12:58 PM  

## 2017-10-22 NOTE — Progress Notes (Signed)
Visible in the dayroom, reading a book, selectively social. Continues to endorse depression with passive SI. States she has had ECT in the past and it worked well for her. Some spontaneous smiles, mood and affect are depressed. No physical clo, remains on routine obs for safety.

## 2017-10-22 NOTE — Progress Notes (Signed)
[facility]  10/22/2017 10:57 AM Melven SartoriusJenifer K Belgrave  MRN:  [id number]   Subjective:   The patient just arrived on the unit yesterday evening. She did not sleep well last night but also did not ask for trazodone. She did not know trazodone had been ordered.. She is somewhat overly inclusive to her room this morning. Affect is sad and tearful and the patient talked about multiple losses in her life including her mother recently transitioning to a nursing facility secondary to Parkinson's disease. She has never been married and has no children and her only other relative, her brother lives in [place]. She denies any current active suicidal thoughts but is endorsing intermittent passive suicidal thoughts but no specific plan. She denies any auditory or visual hallucinations. No paranoid thoughts or delusions. Appetite is low. She was encouraged to try and interact with staff and peers in the day room. She has been compliant with psychotropic medications and denies any physical adverse side effects associated with the medications. She is agreeable to starting ECT next week and has been [facility]  Dialectal behavior therapy techniques discussed and time spent helping the patient to address emotional mind and the need to be more in "wise mind". She has had DBT [facility] in the past at [facility]. Time also spent discussing radical acceptance of current situation.  Past Psychiatric History: Patient has a long history of depression going back decades.  Multiple suicide attempts in the past.  Most notably a suicide attempt by jumping from a height that resulted in spinal fracture with resultant paraplegia.  Multiple antidepressants have been tried with intermittent partial response.  More recently illness has been bad since her mother moved into assisted living.  Patient has [facility] with good response for much of the last year.  Family  Psychiatric  History: None noted  Substance Abuse History: She denies any history of alcohol or illicit drug use.  Principal Problem: Severe recurrent major depression without psychotic features (HCC)   Diagnosis:   Patient Active Problem List   Diagnosis Date Noted  . Severe recurrent major depression without psychotic features (HCC) [F33.2] 10/21/2017  . Paraplegia (HCC) [G82.20] 10/21/2017  . GAD (generalized anxiety disorder) [F41.1] 10/20/2017  . MDD (major depressive disorder), recurrent episode, severe (HCC) [F33.2] 10/18/2017  . Major depressive disorder, recurrent, severe without psychotic features (HCC) [F33.2]    Total Time spent with patient: 30 minutes    Past Medical History:  Past Medical History:  Diagnosis Date  . C6 spinal cord injury (HCC)   . Depression     Past Surgical History:  Procedure Laterality Date  . BACK SURGERY    . NECK SURGERY     Family History:  Family History  Problem Relation Age of Onset  . Alcohol abuse Father   . Alcohol abuse Brother   . Depression Mother   . Alcohol abuse Mother     Social History:  Social History   Substance and Sexual Activity  Alcohol Use No  . Alcohol/week: 0.0 oz   Comment: Occasional use     Social History   Substance and Sexual Activity  Drug Use No    Social History   Socioeconomic History  . Marital status: Single    Spouse name: None  . Number of children: None  . Years of education: None  . Highest education level: None  Social Needs  . Financial resource strain:  None  . Food insecurity - worry: None  . Food insecurity - inability: None  . Transportation needs - medical: None  . Transportation needs - non-medical: None  Occupational History  . None  Tobacco Use  . Smoking status: Former Games developermoker  . Smokeless tobacco: Never Used  . Tobacco comment: No smoking hx; no need for cessation materials  Substance and Sexual Activity  . Alcohol use: No    Alcohol/week: 0.0 oz     Comment: Occasional use  . Drug use: No  . Sexual activity: No  Other Topics Concern  . None  Social History Narrative  . None    Sleep: Fair  Appetite:  Good  Current Medications: Current Facility-Administered Medications  Medication Dose Route Frequency Provider Last Rate Last Dose  . acetaminophen (TYLENOL) tablet 650 mg  650 mg Oral Q6H PRN Clapacs, John T, MD      . alum & mag hydroxide-simeth (MAALOX/MYLANTA) 200-200-20 MG/5ML suspension 30 mL  30 mL Oral Q4H PRN Clapacs, John T, MD      . busPIRone (BUSPAR) tablet 10 mg  10 mg Oral TID Clapacs, Jackquline DenmarkJohn T, MD   10 mg at 10/22/17 [id number]  . clonazePAM (KLONOPIN) tablet 0.5 mg  0.5 mg Oral BID PRN Clapacs, John T, MD      . DULoxetine (CYMBALTA) DR capsule 60 mg  60 mg Oral Daily Clapacs, Jackquline DenmarkJohn T, MD   60 mg at 10/22/17 0852  . magnesium hydroxide (MILK OF MAGNESIA) suspension 30 mL  30 mL Oral Daily PRN Clapacs, John T, MD      . QUEtiapine (SEROQUEL) tablet 25 mg  25 mg Oral BID Clapacs, Jackquline DenmarkJohn T, MD   25 mg at 10/22/17 11910852  . traZODone (DESYREL) tablet 100 mg  100 mg Oral QHS PRN Clapacs, Jackquline DenmarkJohn T, MD        Lab Results: No results found for this or any previous visit (from the past 48 hour(s)).  Blood Alcohol level:  Lab Results  Component Value Date   ETH <10 10/18/2017   ETH <5 08/15/2017    Metabolic Disorder Labs: Lab Results  Component Value Date   HGBA1C 4.7 (L) 02/03/2017   MPG 88 02/03/2017   MPG 108 10/31/2015   Lab Results  Component Value Date   PROLACTIN 121.9 (H) 02/03/2017   Lab Results  Component Value Date   CHOL 214 (H) 02/03/2017   TRIG 117 02/03/2017   HDL 42 02/03/2017   CHOLHDL 5.1 02/03/2017   VLDL 23 02/03/2017   LDLCALC 149 (H) 02/03/2017    Musculoskeletal: Strength & Muscle Tone: decreased Gait & Station: unable to stand Patient leans: NA - in a wheelchair, paraplegic  Psychiatric Specialty Exam: Physical Exam  Psychiatric:  The patient is withdrawn, sad and tearful. She is  endorsing passive suicidal thoughts but no psychosis.    Review of Systems  Constitutional: Negative.   HENT: Negative.   Eyes: Negative.   Respiratory: Negative.   Cardiovascular: Negative.   Gastrointestinal: Negative.   Genitourinary: Negative.   Musculoskeletal: Negative.   Skin: Negative.   Neurological:       The patient is paraplegic and is in a wheelchair. She is unable to ambulate independently.  Endo/Heme/Allergies: Negative.   Psychiatric/Behavioral: Positive for depression and suicidal ideas. Negative for hallucinations, memory loss and substance abuse. The patient is nervous/anxious and has insomnia.     Blood pressure 99/68, pulse 88, temperature 98.8 F (37.1 C), temperature source Oral, resp. rate 18,  height 5' (1.524 m), weight 56.2 kg (124 lb), last menstrual period 09/29/2017.Body mass index is 24.22 kg/m.  General Appearance: Casual  Eye Contact:  Fair  Speech:  Clear and Coherent and Normal Rate  Volume:  Decreased  Mood:  Depressed  Affect:  Depressed  Thought Process:  Coherent, Goal Directed and Linear  Orientation:  Full (Time, Place, and Person)  Thought Content:  Logical  Suicidal Thoughts:  Yes.  without intent/plan  Homicidal Thoughts:  No  Memory:  Immediate;   Good Recent;   Good Remote;   Good  Judgement:  Good  Insight:  Good  Psychomotor Activity:  Normal  Concentration:  Concentration: Good and Attention Span: Good  Recall:  Good  Fund of Knowledge:  Good  Language:  Good  Akathisia:  No  Handed:  Right  AIMS (if indicated):     Assets:  Communication Skills Desire for Improvement Financial Resources/Insurance Housing  ADL's:  Intact  Cognition:  WNL  Sleep:  Number of Hours: 4.5     Treatment Plan Summary:   Major Depressive Disorder, Recurrent, Severe, without Psychotic Features, Generalized Anxiety Disorder:  -The patient is in ECT beginning on Monday. For now, no psychotropic medication changes  -Will plan to continue  on Cymbalta 60 mg by mouth daily, Seroquel 25 mg by mouth twice a day and trazodone 100 mg by mouth nightly when necessary -The patient also has BuSpar 10 mg by mouth 3 times a day and Klonopin 0.5 mg by mouth twice a day when necessary for anxiety. - Hemoglobin A1c and lipid panel will need to be check secondary to being on Seroquel. She is aware metabolic side effects associated with Seroquel. EKG showed QTC of 425.  Paraplegia: -Paraplegia as the result of a suicide attempt. -The patient is in a wheelchair but can transfer to bed without assistance and does live independently at home.  Pre-ECT Workup -EKG showed a QTC of 425. 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 Kathee Polite in [place] for psychotropic medication management and ECT at [facility] or [facility] =She currently lives independently at home  Daily contact with patient to assess and evaluate symptoms and progress in treatment and Medication management  Levora Angel, MD 10/22/2017, 10:57 AM

## 2017-10-22 NOTE — [facility] Group Notes (Signed)
[facility] Group Notes:  (Nursing/MHT/Case Management/Adjunct)  Date:  10/22/2017  Time:  9:20 PM  Type of Therapy:  Group Therapy  Participation Level:  Active  Participation Quality:  Appropriate  Affect:  Appropriate  Cognitive:  Alert  Insight:  Good  Engagement in Group:  Engaged  Modes of Intervention:  Support  Summary of Progress/Problems:  Natasha [name] 10/22/2017, 9:20 PM
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
## 2017-10-23 ENCOUNTER — Other Ambulatory Visit: Payer: Self-pay | Admitting: Psychiatry

## 2017-10-23 LAB — URINALYSIS, COMPLETE (UACMP) WITH MICROSCOPIC
Bilirubin Urine: NEGATIVE
Glucose, UA: NEGATIVE mg/dL
Hgb urine dipstick: NEGATIVE
Ketones, ur: 5 mg/dL — AB
Leukocytes, UA: NEGATIVE
Nitrite: NEGATIVE
Protein, ur: NEGATIVE mg/dL
Specific Gravity, Urine: 1.015 (ref 1.005–1.030)
pH: 5 (ref 5.0–8.0)

## 2017-10-23 LAB — LIPID PANEL
Cholesterol: 181 mg/dL (ref 0–200)
HDL: 46 mg/dL (ref >40)
LDL Cholesterol: 124 mg/dL — ABNORMAL HIGH (ref 0–99)
Total CHOL/HDL Ratio: 3.9 RATIO
Triglycerides: 56 mg/dL (ref <150)
VLDL: 11 mg/dL (ref 0–40)

## 2017-10-23 LAB — HEMOGLOBIN A1C
Hgb A1c MFr Bld: 4.7 % — ABNORMAL LOW (ref 4.8–5.6)
Mean Plasma Glucose: 88.19 mg/dL

## 2017-10-23 NOTE — Progress Notes (Signed)
[facility] MD Progress Note  10/23/2017 10:02 AM Natasha [name]  MRN:  [id number]   Subjective:   The patient reports that she slept better last night. She has been visible at times in the day room but does not interact a lot with other patients. She did have passive suicidal thoughts throughout the day yesterday and again this morning. She says the suicidal thoughts or worsening morning and then get better in the afternoon. She denies any current active suicidal thoughts and is able to contract for safety on the unit. She denies any psychotic symptoms. Time spent discussing the patient's isolation home and lack of structure and schedule which contributed to increased anxiety and recurrent negative thoughts about life. She denies any new somatic complaints. Blood pressure was slightly low this morning but she denies any dizziness or change in vision. She is going out to the day room for meals and was encouraged to eat regularly. She did not attend groups yesterday. Her friend from [place] did call her yesterday which brighter spirits and will hopefully come visit today.  Dialectical behavior therapy techniques discussed and reinforced. Times spent discussing "emotional mind and wise mind" and the need for the patient decrease "catastrophizing" thoughts"especially now that her mother has moved into a facility. Times and discussing the need for the patient have structure throughout the daytime and a schedule especially when she wakes up when the suicidal thoughts are more intense.  Past Psychiatric History: Patient has a long history of depression going back decades.  Multiple suicide attempts in the past.  Most notably a suicide attempt by jumping from a height that resulted in spinal fracture with resultant paraplegia.  Multiple antidepressants have been tried with intermittent partial response.  More recently illness has been bad since her mother moved into assisted living.  Patient has been  receiving outpatient ECT at [facility] with good response for much of the last year.  Family Psychiatric  History: None noted  Substance Abuse History: She denies any history of alcohol or illicit drug use.  Principal Problem: Severe recurrent major depression without psychotic features (HCC)   Diagnosis:   Patient Active Problem List   Diagnosis Date Noted  . Severe recurrent major depression without psychotic features (HCC) [F33.2] 10/21/2017  . Paraplegia (HCC) [G82.20] 10/21/2017  . GAD (generalized anxiety disorder) [F41.1] 10/20/2017  . MDD (major depressive disorder), recurrent episode, severe (HCC) [F33.2] 10/18/2017  . Major depressive disorder, recurrent, severe without psychotic features (HCC) [F33.2]    Total Time spent with patient: 30 minutes    Past Medical History:  Past Medical History:  Diagnosis Date  . C6 spinal cord injury (HCC)   . Depression     Past Surgical History:  Procedure Laterality Date  . BACK SURGERY    . NECK SURGERY     Family History:  Family History  Problem Relation Age of Onset  . Alcohol abuse Father   . Alcohol abuse Brother   . Depression Mother   . Alcohol abuse Mother     Social History:  Social History   Substance and Sexual Activity  Alcohol Use No  . Alcohol/week: 0.0 oz   Comment: Occasional use     Social History   Substance and Sexual Activity  Drug Use No    Social History   Socioeconomic History  . Marital status: Single    Spouse name: None  . Number of children: None  . Years of education: None  . Highest education level:  None  Social Needs  . Financial resource strain: None  . Food insecurity - worry: None  . Food insecurity - inability: None  . Transportation needs - medical: None  . Transportation needs - non-medical: None  Occupational History  . None  Tobacco Use  . Smoking status: Former Games developermoker  . Smokeless tobacco: Never Used  . Tobacco comment: No smoking hx; no need for  cessation materials  Substance and Sexual Activity  . Alcohol use: No    Alcohol/week: 0.0 oz    Comment: Occasional use  . Drug use: No  . Sexual activity: No  Other Topics Concern  . None  Social History Narrative  . None    Sleep: Better  Appetite:  Good  Current Medications: Current Facility-Administered Medications  Medication Dose Route Frequency Provider Last Rate Last Dose  . acetaminophen (TYLENOL) tablet 650 mg  650 mg Oral Q6H PRN Clapacs, John T, MD      . alum & mag hydroxide-simeth (MAALOX/MYLANTA) 200-200-20 MG/5ML suspension 30 mL  30 mL Oral Q4H PRN Clapacs, John T, MD      . busPIRone (BUSPAR) tablet 10 mg  10 mg Oral TID Clapacs, Jackquline DenmarkJohn T, MD   10 mg at 10/23/17 0814  . clonazePAM (KLONOPIN) tablet 0.5 mg  0.5 mg Oral BID PRN Clapacs, John T, MD      . DULoxetine (CYMBALTA) DR capsule 60 mg  60 mg Oral Daily Clapacs, Jackquline DenmarkJohn T, MD   60 mg at 10/23/17 0814  . magnesium hydroxide (MILK OF MAGNESIA) suspension 30 mL  30 mL Oral Daily PRN Clapacs, John T, MD      . QUEtiapine (SEROQUEL) tablet 25 mg  25 mg Oral BID Clapacs, Jackquline DenmarkJohn T, MD   25 mg at 10/23/17 0814  . traZODone (DESYREL) tablet 100 mg  100 mg Oral QHS PRN Clapacs, Jackquline DenmarkJohn T, MD   100 mg at 10/22/17 2219    Lab Results:  Results for orders placed or performed during the hospital encounter of 10/21/17 (from the past 48 hour(s))  Urinalysis, Complete w Microscopic     Status: Abnormal   Collection Time: 10/23/17  6:15 AM  Result Value Ref Range   Color, Urine YELLOW (A) YELLOW   APPearance HAZY (A) CLEAR   Specific Gravity, Urine 1.015 1.005 - 1.030   pH 5.0 5.0 - 8.0   Glucose, UA NEGATIVE NEGATIVE mg/dL   Hgb urine dipstick NEGATIVE NEGATIVE   Bilirubin Urine NEGATIVE NEGATIVE   Ketones, ur 5 (A) NEGATIVE mg/dL   Protein, ur NEGATIVE NEGATIVE mg/dL   Nitrite NEGATIVE NEGATIVE   Leukocytes, UA NEGATIVE NEGATIVE   RBC / HPF 0-5 0 - 5 RBC/hpf   WBC, UA 0-5 0 - 5 WBC/hpf   Bacteria, UA MANY (A) NONE SEEN    Squamous Epithelial / LPF 0-5 (A) NONE SEEN   Mucus PRESENT   Lipid panel     Status: Abnormal   Collection Time: 10/23/17  6:37 AM  Result Value Ref Range   Cholesterol 181 0 - 200 mg/dL   Triglycerides 56 <161<150 mg/dL   HDL 46 >09>40 mg/dL   Total CHOL/HDL Ratio 3.9 RATIO   VLDL 11 0 - 40 mg/dL   LDL Cholesterol 604124 (H) 0 - 99 mg/dL    Comment:        Total Cholesterol/HDL:CHD Risk Coronary Heart Disease Risk Table  Men   Women  1/2 Average Risk   3.4   3.3  Average Risk       5.0   4.4  2 X Average Risk   9.6   7.1  3 X Average Risk  23.4   11.0        Use the calculated Patient Ratio above and the CHD Risk Table to determine the patient's CHD Risk.        ATP III CLASSIFICATION (LDL):  <100     mg/dL   Optimal  161-096100-129  mg/dL   Near or Above                    Optimal  130-159  mg/dL   Borderline  045-409160-189  mg/dL   High  >811>190     mg/dL   Very High     Blood Alcohol level:  Lab Results  Component Value Date   ETH <10 10/18/2017   ETH <5 08/15/2017    Metabolic Disorder Labs: Lab Results  Component Value Date   HGBA1C 4.7 (L) 02/03/2017   MPG 88 02/03/2017   MPG 108 10/31/2015   Lab Results  Component Value Date   PROLACTIN 121.9 (H) 02/03/2017   Lab Results  Component Value Date   CHOL 181 10/23/2017   TRIG 56 10/23/2017   HDL 46 10/23/2017   CHOLHDL 3.9 10/23/2017   VLDL 11 10/23/2017   LDLCALC 124 (H) 10/23/2017   LDLCALC 149 (H) 02/03/2017    Musculoskeletal: Strength & Muscle Tone: decreased Gait & Station: unable to stand Patient leans: NA - in a wheelchair, paraplegic  Psychiatric Specialty Exam: Physical Exam  Psychiatric:  The patient is withdrawn, sad and tearful. She is endorsing passive suicidal thoughts but no psychosis.    Review of Systems  Constitutional: Negative.   HENT: Negative.   Eyes: Negative.   Respiratory: Negative.   Cardiovascular: Negative.   Gastrointestinal: Negative.   Genitourinary:  Negative.   Musculoskeletal: Negative.   Skin: Negative.   Neurological:       The patient is paraplegic and is in a wheelchair. She is unable to ambulate independently.  Endo/Heme/Allergies: Negative.   Psychiatric/Behavioral: Positive for depression and suicidal ideas. Negative for hallucinations, memory loss and substance abuse. The patient is nervous/anxious and has insomnia.     Blood pressure (!) 95/57, pulse 89, temperature 98.6 F (37 C), temperature source Oral, resp. rate 18, height 5' (1.524 m), weight 56.2 kg (124 lb), last menstrual period 09/29/2017, SpO2 98 %.Body mass index is 24.22 kg/m.  General Appearance: Casual  Eye Contact:  Poor  Speech:  Clear and Coherent and Normal Rate  Volume:  Decreased  Mood:  " a little better today"  Affect:  Depressed and sad looking  Thought Process:  Coherent, Goal Directed and Linear  Orientation:  Full (Time, Place, and Person)  Thought Content:  Logical  Suicidal Thoughts:  Yes.  without intent/plan - passive SI, no plan  Homicidal Thoughts:  No  Memory:  Immediate;   Good Recent;   Good Remote;   Good  Judgement:  Good  Insight:  Good  Psychomotor Activity:  Normal  Concentration:  Concentration: Good and Attention Span: Good  Recall:  Good  Fund of Knowledge:  Good  Language:  Good  Akathisia:  No  Handed:  Right  AIMS (if indicated):     Assets:  Communication Skills Desire for Improvement Financial Resources/Insurance Housing  ADL's:  Intact  Cognition:  WNL  Sleep:  Number of Hours: 6    Diagnosis Major depressive disorder, recurrent, severe without psychotic features Generalized anxiety disorder Paraplegia Severe: Lack of primary support, chronically disabled   Treatment Plan Summary:  Major Depressive Disorder, Recurrent, Severe, without Psychotic Features, Generalized Anxiety Disorder:  -The patient plans to start ECT beginning on Monday. For now, no psychotropic medication changes  -Will plan to  continue on Cymbalta 60 mg by mouth daily, Seroquel 25 mg by mouth twice a day and trazodone 100 mg by mouth nightly when necessary -The patient also has BuSpar 10 mg by mouth 3 times a day and Klonopin 0.5 mg by mouth twice a day when necessary for anxiety. - Hemoglobin A1c was 4.7. Cholesterol was 181. She is aware metabolic side effects associated with Seroquel. EKG showed QTC of 425.  Paraplegia: -Paraplegia as the result of a suicide attempt. -The patient is in a wheelchair but can transfer to bed without assistance and does live independently at home.  Pre-ECT Workup -EKG showed a QTC of 425. 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 Kathee Polite in [place] for psychotropic medication management and ECT at [facility] or [facility] =She currently lives independently at home  Daily contact with patient to assess and evaluate symptoms and progress in treatment and Medication management  Levora Angel, MD 10/23/2017, 10:02 AM

## 2017-10-23 NOTE — [facility] Group Notes (Signed)
10/23/2017 1:15am  Type of Therapy and Topic:  Group Therapy:  Cognitive Distortions  Participation Level:  Active   Description of Group:    Patients in this group will be introduced to the topic of cognitive distortions.  Patients will identify and describe cognitive distortions, describe the feelings these distortions create for them.  Patients will identify one or more situations in their personal life where they have cognitively distorted thinking and will verbalize challenging this cognitive distortion through positive thinking skills.  Patients will practice the skill of using positive affirmations to challenge cognitive distortions using affirmation cards.    Therapeutic Goals:  1. Patient will identify two or more cognitive distortions they have used 2. Patient will identify one or more emotions that stem from use of a cognitive distortion 3. Patient will demonstrate use of a positive affirmation to counter a cognitive distortion through discussion and/or role play. 4. Patient will describe one way cognitive distortions can be detrimental to wellness   Summary of Patient Progress: Patient reports that she feels much better than yesterday. She reports that she slept better last night. Patient reports that some of her unhelpful thinking falls into the all or nothing thinking. Patient fully engaged in group discussion.      Therapeutic Modalities:   Cognitive Behavioral Therapy Motivational Interviewing   Bless Lisenby  CUEBAS-COLON, LCSW 10/23/2017 2:39 PM

## 2017-10-23 NOTE — Plan of Care (Signed)
Patient up and about on the unit. She eats her meals and will read in the dayroom. She admits to having suicidal thoughts but does not want to act on them. She states ECT has worked in the past. She is pleasant and med compliant. Will continue to monitor.

## 2017-10-24 ENCOUNTER — Inpatient Hospital Stay: Payer: Medicare Other | Admitting: Anesthesiology

## 2017-10-24 DIAGNOSIS — F339 Major depressive disorder, recurrent, unspecified: Secondary | ICD-10-CM | POA: Diagnosis not present

## 2017-10-24 LAB — GLUCOSE, CAPILLARY: Glucose-Capillary: 95 mg/dL (ref 65–99)

## 2017-10-24 MED ORDER — SODIUM CHLORIDE 0.9 % IV SOLN
500.0000 mL | Freq: Once | INTRAVENOUS | Status: AC
  Administered 2017-10-24: 500 mL via INTRAVENOUS

## 2017-10-24 MED ORDER — GLYCOPYRROLATE 0.2 MG/ML IJ SOLN
0.1000 mg | Freq: Once | INTRAMUSCULAR | Status: AC
  Administered 2017-10-24: 0.1 mg via INTRAVENOUS

## 2017-10-24 MED ORDER — METHOHEXITAL SODIUM 0.5 G IJ SOLR
INTRAMUSCULAR | Status: AC
  Filled 2017-10-24: qty 500

## 2017-10-24 MED ORDER — KETOROLAC TROMETHAMINE 30 MG/ML IJ SOLN
30.0000 mg | Freq: Once | INTRAMUSCULAR | Status: AC
  Administered 2017-10-24: 30 mg via INTRAVENOUS

## 2017-10-24 MED ORDER — SUCCINYLCHOLINE CHLORIDE 20 MG/ML IJ SOLN
INTRAMUSCULAR | Status: AC
  Filled 2017-10-24: qty 2

## 2017-10-24 MED ORDER — GLYCOPYRROLATE 0.2 MG/ML IJ SOLN
INTRAMUSCULAR | Status: AC
  Filled 2017-10-24: qty 1

## 2017-10-24 MED ORDER — METHOHEXITAL SODIUM 100 MG/10ML IV SOSY
PREFILLED_SYRINGE | INTRAVENOUS | Status: DC | PRN
  Administered 2017-10-24: 50 mg via INTRAVENOUS

## 2017-10-24 MED ORDER — KETOROLAC TROMETHAMINE 30 MG/ML IJ SOLN
INTRAMUSCULAR | Status: AC
Start: 2017-10-24 — End: 2017-10-24
  Filled 2017-10-24: qty 1

## 2017-10-24 MED ORDER — SODIUM CHLORIDE 0.9 % IV SOLN
INTRAVENOUS | Status: DC | PRN
  Administered 2017-10-24: 10:00:00 via INTRAVENOUS

## 2017-10-24 MED ORDER — SUCCINYLCHOLINE CHLORIDE 200 MG/10ML IV SOSY
PREFILLED_SYRINGE | INTRAVENOUS | Status: DC | PRN
  Administered 2017-10-24: 60 mg via INTRAVENOUS

## 2017-10-24 NOTE — Anesthesia Postprocedure Evaluation (Signed)
Anesthesia Post Note  Patient: Ulyses SouthwardJenifer Katherine Naclerio  Procedure(s) Performed: ECT TX  Patient location during evaluation: PACU Anesthesia Type: General Level of consciousness: awake and alert Pain management: pain level controlled Vital Signs Assessment: post-procedure vital signs reviewed and stable Respiratory status: spontaneous breathing, nonlabored ventilation and respiratory function stable Cardiovascular status: blood pressure returned to baseline and stable Postop Assessment: no signs of nausea or vomiting Anesthetic complications: no     Last Vitals:  Vitals:   10/24/17 1105 10/24/17 1115  BP: 115/76 109/77  Pulse: 83 75  Resp: 13 13  Temp:  36.7 C  SpO2: 100% 100%    Last Pain:  Vitals:   10/24/17 1045  TempSrc:   PainSc: Asleep                 Aitanna Haubner

## 2017-10-24 NOTE — Tx Team (Signed)
Interdisciplinary Treatment and Diagnostic Plan Update  10/24/2017 Time of Session: [address] Dan[address] Lubertha SouthKatherine [name] MRN: [id number]  Principal Diagnosis: Severe recurrent major depression without psychotic features [facility](HCC)  Secondary Diagnoses: Principal Problem:   Severe recurrent major depression without psychotic features (HCC) Active Problems:   Paraplegia (HCC)   Current Medications:  Current Facility-Administered Medications  Medication Dose Route Frequency Provider Last Rate Last Dose  . acetaminophen (TYLENOL) tablet 650 mg  650 mg Oral Q6H PRN Clapacs, John T, MD      . alum & mag hydroxide-simeth (MAALOX/MYLANTA) 200-200-20 MG/5ML suspension 30 mL  30 mL Oral Q4H PRN Clapacs, John T, MD      . busPIRone (BUSPAR) tablet 10 mg  10 mg Oral TID Clapacs, Jackquline DenmarkJohn T, MD   10 mg at 10/24/17 1151  . clonazePAM (KLONOPIN) tablet 0.5 mg  0.5 mg Oral BID PRN Clapacs, John T, MD      . DULoxetine (CYMBALTA) DR capsule 60 mg  60 mg Oral Daily Clapacs, John T, MD   60 mg at 10/24/17 1150  . glycopyrrolate (ROBINUL) 0.2 MG/ML injection           . ketorolac (TORADOL) 30 MG/ML injection           . magnesium hydroxide (MILK OF MAGNESIA) suspension 30 mL  30 mL Oral Daily PRN Clapacs, John T, MD      . QUEtiapine (SEROQUEL) tablet 25 mg  25 mg Oral BID Clapacs, Jackquline DenmarkJohn T, MD   25 mg at 10/24/17 1151  . traZODone (DESYREL) tablet 100 mg  100 mg Oral QHS PRN Clapacs, Jackquline DenmarkJohn T, MD   100 mg at 10/23/17 2306   PTA Medications: Medications Prior to Admission  Medication Sig Dispense Refill Last Dose  . acetaminophen (TYLENOL) 325 MG tablet Take 2 tablets (650 mg total) every 6 (six) hours as needed by mouth for mild pain.   10/23/2017  . alum & mag hydroxide-simeth (MAALOX/MYLANTA) 200-200-20 MG/5ML suspension Take 30 mLs every 4 (four) hours as needed by mouth for indigestion. 355 mL 0 10/23/2017  . busPIRone (BUSPAR) 10 MG tablet Take 1 tablet (10 mg total) 3 (three) times daily by mouth.   10/23/2017  .  clonazePAM (KLONOPIN) 0.5 MG tablet Take 1 tablet (0.5 mg total) 2 (two) times daily as needed by mouth (anxiety). 30 tablet 0 10/23/2017  . DULoxetine (CYMBALTA) 60 MG capsule Take 1 capsule (60 mg total) daily by mouth.  3 10/23/2017  . feeding supplement, ENSURE ENLIVE, (ENSURE ENLIVE) LIQD Take 237 mLs 2 (two) times daily between meals by mouth. 237 mL 12 10/23/2017  . magnesium hydroxide (MILK OF MAGNESIA) 400 MG/5ML suspension Take 30 mLs daily as needed by mouth for mild constipation. 360 mL 0 10/23/2017  . ondansetron (ZOFRAN) 4 MG tablet Take 1 tablet (4 mg total) every 8 (eight) hours as needed by mouth for nausea or vomiting. 20 tablet 0 10/23/2017  . QUEtiapine (SEROQUEL) 25 MG tablet Take 1 tablet (25 mg total) 2 (two) times daily by mouth.   10/23/2017  . traZODone (DESYREL) 100 MG tablet Take 1 tablet (100 mg total) at bedtime as needed by mouth for sleep.   10/23/2017    Patient Stressors: Loss of mother to an assistant facility Other: mobility resulting in feeling alone and unworthy  Patient Strengths: Ability for insight Capable of independent living Communication skills  Treatment Modalities: Medication Management, Group therapy, Case management,  1 to 1 session with clinician, Psychoeducation, Recreational therapy.   Physician  Treatment Plan for Primary Diagnosis: Severe recurrent major depression without psychotic features (HCC) Long Term Goal(s): Improvement in symptoms so as ready for discharge Improvement in symptoms so as ready for discharge   Short Term Goals: Ability to verbalize feelings will improve Ability to disclose and discuss suicidal ideas Ability to maintain clinical measurements within normal limits will improve Compliance with prescribed medications will improve  Medication Management: Evaluate patient's response, side effects, and tolerance of medication regimen.  Therapeutic Interventions: 1 to 1 sessions, Unit Group sessions and Medication  administration.  Evaluation of Outcomes: Progressing  Physician Treatment Plan for Secondary Diagnosis: Principal Problem:   Severe recurrent major depression without psychotic features (HCC) Active Problems:   Paraplegia (HCC)  Long Term Goal(s): Improvement in symptoms so as ready for discharge Improvement in symptoms so as ready for discharge   Short Term Goals: Ability to verbalize feelings will improve Ability to disclose and discuss suicidal ideas Ability to maintain clinical measurements within normal limits will improve Compliance with prescribed medications will improve     Medication Management: Evaluate patient's response, side effects, and tolerance of medication regimen.  Therapeutic Interventions: 1 to 1 sessions, Unit Group sessions and Medication administration.  Evaluation of Outcomes: Progressing   RN Treatment Plan for Primary Diagnosis: Severe recurrent major depression without psychotic features (HCC) Long Term Goal(s): Knowledge of disease and therapeutic regimen to maintain health will improve  Short Term Goals: Ability to identify and develop effective coping behaviors will improve and Compliance with prescribed medications will improve  Medication Management: RN will administer medications as ordered by provider, will assess and evaluate patient's response and provide education to patient for prescribed medication. RN will report any adverse and/or side effects to prescribing provider.  Therapeutic Interventions: 1 on 1 counseling sessions, Psychoeducation, Medication administration, Evaluate responses to treatment, Monitor vital signs and CBGs as ordered, Perform/monitor CIWA, COWS, AIMS and Fall Risk screenings as ordered, Perform wound care treatments as ordered.  Evaluation of Outcomes: Progressing   LCSW Treatment Plan for Primary Diagnosis: Severe recurrent major depression without psychotic features (HCC) Long Term Goal(s): Safe transition to  appropriate next level of care at discharge, Engage patient in therapeutic group addressing interpersonal concerns.  Short Term Goals: Engage patient in aftercare planning with referrals and resources and Increase skills for wellness and recovery  Therapeutic Interventions: Assess for all discharge needs, 1 to 1 time with Social worker, Explore available resources and support systems, Assess for adequacy in community support network, Educate family and significant other(s) on suicide prevention, Complete Psychosocial Assessment, Interpersonal group therapy.  Evaluation of Outcomes: Progressing   Progress in Treatment: Attending groups: Yes. Participating in groups: Yes. Taking medication as prescribed: Yes. Toleration medication: Yes. Family/Significant other contact made: No, will contact:  friend Patient understands diagnosis: Yes. Discussing patient identified problems/goals with staff: Yes. Medical problems stabilized or resolved: Yes. Denies suicidal/homicidal ideation: Yes. Issues/concerns per patient self-inventory: No. Other: none  New problem(s) identified: No, Describe:  none  New Short Term/Long Term Goal(s):Pt goal: feel better, less depression, be able to live independently.  Discharge Plan or Barriers: [facility] [place]  Reason for Continuation of Hospitalization: Depression Medication stabilization Other; describe ECT  Estimated Length of Stay:  Attendees: Patient: Natasha FinnerJennifer [name] 10/24/2017 2:36 PM  Physician: Dr. Toni Amendlapacs, MD 10/24/2017 2:36 PM  Nursing: Horton MarshallGigi Maniattu, RN 10/24/2017 2:36 PM  RN Care Manager: 10/24/2017 2:36 PM  Social Worker:  Daleen Squibb, LCSW 10/24/2017 2:36 PM  Recreational Therapist:  10/24/2017 2:36 PM  Other:  10/24/2017 2:36 PM  Other:  10/24/2017 2:36 PM  Other: 10/24/2017 2:36 PM    Scribe for Treatment Team: Lorri Frederick, LCSW 10/24/2017 2:36  PM

## 2017-10-24 NOTE — Transfer of Care (Signed)
Immediate Anesthesia Transfer of Care Note  Patient: Natasha [name]  Procedure(s) Performed: ECT TX  Patient Location: PACU  Anesthesia Type:General  Level of Consciousness: awake, alert  and oriented  Airway & Oxygen Therapy: Patient Spontanous Breathing and Patient connected to face mask oxygen  Post-op Assessment: Report given to RN and Post -op Vital signs reviewed and stable  Post vital signs: Reviewed and stable  Last Vitals:  Vitals:   10/24/17 0921 10/24/17 1045  BP: 112/63 (!) 132/94  Pulse: 67 86  Resp:  15  Temp: 36.6 C 36.9 C  SpO2: 96% 100%    Last Pain:  Vitals:   10/24/17 1045  TempSrc:   PainSc: (P) Asleep         Complications: No apparent anesthesia complications

## 2017-10-24 NOTE — Anesthesia Procedure Notes (Signed)
Performed by: Karoline CaldwellStarr, Azriel Dancy, CRNA Patient Re-evaluated:Patient Re-evaluated prior to induction Oxygen Delivery Method: Circle system utilized Preoxygenation: Pre-oxygenation with 100% oxygen Induction Type: IV induction Ventilation: Mask ventilation without difficulty Airway Equipment and Method: Bite block

## 2017-10-24 NOTE — [facility] Group Notes (Signed)
LCSW Group Therapy Note   10/24/2017 9:30am   Type of Therapy and Topic:  Group Therapy:  Overcoming Obstacles   Participation Level:  Did Not Attend   Description of Group:    In this group patients will be encouraged to explore what they see as obstacles to their own wellness and recovery. They will be guided to discuss their thoughts, feelings, and behaviors related to these obstacles. The group will process together ways to cope with barriers, with attention given to specific choices patients can make. Each patient will be challenged to identify changes they are motivated to make in order to overcome their obstacles. This group will be process-oriented, with patients participating in exploration of their own experiences as well as giving and receiving support and challenge from other group members.   Therapeutic Goals: 1. Patient will identify personal and current obstacles as they relate to admission. 2. Patient will identify barriers that currently interfere with their wellness or overcoming obstacles.  3. Patient will identify feelings, thought process and behaviors related to these barriers. 4. Patient will identify two changes they are willing to make to overcome these obstacles:      Summary of Patient Progress      Therapeutic Modalities:   Cognitive Behavioral Therapy Solution Focused Therapy Motivational Interviewing Relapse Prevention Therapy  Glennon MacSara P Eirik Schueler, LCSW 10/24/2017 1:12 PM

## 2017-10-24 NOTE — [facility] Suicide Risk Assessment (Signed)
[facility] INPATIENT:  Family/Significant Other Suicide Prevention Education  Suicide Prevention Education:  Education Completed; Natasha [name], friend, [phone], has been identified by the patient as the family member/significant other with whom the patient will be residing, and identified as the person(s) who will aid the patient in the event of a mental health crisis (suicidal ideations/suicide attempt).  With written consent from the patient, the family member/significant other has been provided the following suicide prevention education, prior to the and/or following the discharge of the patient.  The suicide prevention education provided includes the following:  Suicide risk factors  Suicide prevention and interventions  National Suicide Hotline telephone number  [facility] assessment telephone number  [facility] Emergency Assistance 911  [facility] and/or Residential Mobile Crisis Unit telephone number  Request made of family/significant other to:  Remove weapons (e.g., guns, rifles, knives), all items previously/currently identified as safety concern.  No guns in the home, per Natasha [name].  Remove drugs/medications (over-the-counter, prescriptions, illicit drugs), all items previously/currently identified as a safety concern. Pt does have a bedside table with a lot of medication.  CSW encouraged Natasha [name] to talk with pt about what is needed.  The family member/significant other verbalizes understanding of the suicide prevention education information provided.  The family member/significant other agrees to remove the items of safety concern listed above.  Natasha [name] reports pt has had long term issues with depression.  Natasha [name] las lived with pt and recently had to go to an assisted living facility as pt could no longer physically manage her [name] and her [name] had fallen several times.  This has been difficult.  [place] now [place].  Natasha [name]  does speak to her very regularly and they have talked about safety issues before.  Natasha [name], Natasha Scherger Jon, LCSW 10/24/2017, 2:47 PM

## 2017-10-24 NOTE — Progress Notes (Signed)
[facility] MD Progress Note  10/24/2017 5:04 PM Natasha [name]  MRN:  [id number] Subjective: "I guess I am okay" patient had ECT today.  Has no complaints in the afternoon.  Minimal pain.  Feeling tired however.  Mood is still depressed and down with passive suicidal ideation but no new complaints.  Physically appears stabilizing. Principal Problem: Severe recurrent major depression without psychotic features (HCC) Diagnosis:   Patient Active Problem List   Diagnosis Date Noted  . Severe recurrent major depression without psychotic features (HCC) [F33.2] 10/21/2017  . Paraplegia (HCC) [G82.20] 10/21/2017  . GAD (generalized anxiety disorder) [F41.1] 10/20/2017  . MDD (major depressive disorder), recurrent episode, severe (HCC) [F33.2] 10/18/2017  . Major depressive disorder, recurrent, severe without psychotic features (HCC) [F33.2]    Total Time spent with patient: 45 minutes  Past Psychiatric History: Past history of severe long-standing depression several serious suicide attempts and hospitalizations  Past Medical History:  Past Medical History:  Diagnosis Date  . C6 spinal cord injury (HCC)   . Depression     Past Surgical History:  Procedure Laterality Date  . BACK SURGERY    . NECK SURGERY     Family History:  Family History  Problem Relation Age of Onset  . Alcohol abuse Father   . Alcohol abuse Brother   . Depression Mother   . Alcohol abuse Mother    Family Psychiatric  History: Depression Social History:  Social History   Substance and Sexual Activity  Alcohol Use No  . Alcohol/week: 0.0 oz   Comment: Occasional use     Social History   Substance and Sexual Activity  Drug Use No    Social History   Socioeconomic History  . Marital status: Single    Spouse name: None  . Number of children: None  . Years of education: None  . Highest education level: None  Social Needs  . Financial resource strain: None  . Food insecurity - worry: None  . Food  insecurity - inability: None  . Transportation needs - medical: None  . Transportation needs - non-medical: None  Occupational History  . None  Tobacco Use  . Smoking status: Former Games developer  . Smokeless tobacco: Never Used  . Tobacco comment: No smoking hx; no need for cessation materials  Substance and Sexual Activity  . Alcohol use: No    Alcohol/week: 0.0 oz    Comment: Occasional use  . Drug use: No  . Sexual activity: No  Other Topics Concern  . None  Social History Narrative  . None   Additional Social History:                         Sleep: Fair  Appetite:  Fair  Current Medications: Current Facility-Administered Medications  Medication Dose Route Frequency Provider Last Rate Last Dose  . acetaminophen (TYLENOL) tablet 650 mg  650 mg Oral Q6H PRN Clapacs, John T, MD      . alum & mag hydroxide-simeth (MAALOX/MYLANTA) 200-200-20 MG/5ML suspension 30 mL  30 mL Oral Q4H PRN Clapacs, John T, MD      . busPIRone (BUSPAR) tablet 10 mg  10 mg Oral TID Clapacs, Jackquline Denmark, MD   10 mg at 10/24/17 1151  . clonazePAM (KLONOPIN) tablet 0.5 mg  0.5 mg Oral BID PRN Clapacs, John T, MD      . DULoxetine (CYMBALTA) DR capsule 60 mg  60 mg Oral Daily Clapacs, Jackquline Denmark, MD  60 mg at 10/24/17 1150  . glycopyrrolate (ROBINUL) 0.2 MG/ML injection           . ketorolac (TORADOL) 30 MG/ML injection           . magnesium hydroxide (MILK OF MAGNESIA) suspension 30 mL  30 mL Oral Daily PRN Clapacs, John T, MD      . QUEtiapine (SEROQUEL) tablet 25 mg  25 mg Oral BID Clapacs, Jackquline DenmarkJohn T, MD   25 mg at 10/24/17 1151  . traZODone (DESYREL) tablet 100 mg  100 mg Oral QHS PRN Clapacs, Jackquline DenmarkJohn T, MD   100 mg at 10/23/17 2306    Lab Results:  Results for orders placed or performed during the hospital encounter of 10/21/17 (from the past 48 hour(s))  Urinalysis, Complete w Microscopic     Status: Abnormal   Collection Time: 10/23/17  6:15 AM  Result Value Ref Range   Color, Urine YELLOW (A) YELLOW    APPearance HAZY (A) CLEAR   Specific Gravity, Urine 1.015 1.005 - 1.030   pH 5.0 5.0 - 8.0   Glucose, UA NEGATIVE NEGATIVE mg/dL   Hgb urine dipstick NEGATIVE NEGATIVE   Bilirubin Urine NEGATIVE NEGATIVE   Ketones, ur 5 (A) NEGATIVE mg/dL   Protein, ur NEGATIVE NEGATIVE mg/dL   Nitrite NEGATIVE NEGATIVE   Leukocytes, UA NEGATIVE NEGATIVE   RBC / HPF 0-5 0 - 5 RBC/hpf   WBC, UA 0-5 0 - 5 WBC/hpf   Bacteria, UA MANY (A) NONE SEEN   Squamous Epithelial / LPF 0-5 (A) NONE SEEN   Mucus PRESENT   Lipid panel     Status: Abnormal   Collection Time: 10/23/17  6:37 AM  Result Value Ref Range   Cholesterol 181 0 - 200 mg/dL   Triglycerides 56 <161<150 mg/dL   HDL 46 >09>40 mg/dL   Total CHOL/HDL Ratio 3.9 RATIO   VLDL 11 0 - 40 mg/dL   LDL Cholesterol 604124 (H) 0 - 99 mg/dL    Comment:        Total Cholesterol/HDL:CHD Risk Coronary Heart Disease Risk Table                     Men   Women  1/2 Average Risk   3.4   3.3  Average Risk       5.0   4.4  2 X Average Risk   9.6   7.1  3 X Average Risk  23.4   11.0        Use the calculated Patient Ratio above and the CHD Risk Table to determine the patient's CHD Risk.        ATP III CLASSIFICATION (LDL):  <100     mg/dL   Optimal  540-981100-129  mg/dL   Near or Above                    Optimal  130-159  mg/dL   Borderline  191-478160-189  mg/dL   High  >295>190     mg/dL   Very High   Hemoglobin A1c     Status: Abnormal   Collection Time: 10/23/17  6:37 AM  Result Value Ref Range   Hgb A1c MFr Bld 4.7 (L) 4.8 - 5.6 %    Comment: (NOTE) Pre diabetes:          5.7%-6.4% Diabetes:              >6.4% Glycemic control for   <7.0% adults  with diabetes    Mean Plasma Glucose 88.19 mg/dL    Comment: Performed at [facility], [address] [place], [place] [address]  Glucose, capillary     Status: None   Collection Time: 10/24/17  6:49 AM  Result Value Ref Range   Glucose-Capillary 95 65 - 99 mg/dL   Comment 1 Notify RN     Blood Alcohol  level:  Lab Results  Component Value Date   ETH <10 10/18/2017   ETH <5 08/15/2017    Metabolic Disorder Labs: Lab Results  Component Value Date   HGBA1C 4.7 (L) 10/23/2017   MPG 88.19 10/23/2017   MPG 88 02/03/2017   Lab Results  Component Value Date   PROLACTIN 121.9 (H) 02/03/2017   Lab Results  Component Value Date   CHOL 181 10/23/2017   TRIG 56 10/23/2017   HDL 46 10/23/2017   CHOLHDL 3.9 10/23/2017   VLDL 11 10/23/2017   LDLCALC 124 (H) 10/23/2017   LDLCALC 149 (H) 02/03/2017    Physical Findings: AIMS:  , ,  ,  ,    CIWA:    COWS:     Musculoskeletal: Strength & Muscle Tone: decreased Gait & Station: unable to stand Patient leans: N/A  Psychiatric Specialty Exam: Physical Exam  Nursing note and vitals reviewed. Constitutional: She appears well-developed and well-nourished.  HENT:  Head: Normocephalic and atraumatic.  Eyes: Conjunctivae are normal. Pupils are equal, round, and reactive to light.  Neck: Normal range of motion.  Cardiovascular: Regular rhythm and normal heart sounds.  Respiratory: Effort normal. No respiratory distress.  GI: Soft.  Musculoskeletal: Normal range of motion.  Neurological: She is alert.  Paraplegia as noted previously  Skin: Skin is warm and dry.  Psychiatric: Judgment normal. Her speech is delayed. She is slowed and withdrawn. Cognition and memory are normal. She exhibits a depressed mood. She expresses suicidal ideation. She expresses no suicidal plans.    Review of Systems  Constitutional: Negative.   HENT: Negative.   Eyes: Negative.   Respiratory: Negative.   Cardiovascular: Negative.   Gastrointestinal: Negative.   Musculoskeletal: Negative.   Skin: Negative.   Neurological: Negative.   Psychiatric/Behavioral: Positive for depression and suicidal ideas. Negative for hallucinations, memory loss and substance abuse. The patient is nervous/anxious. The patient does not have insomnia.     Blood pressure 98/73,  pulse 99, temperature 98 F (36.7 C), temperature source Oral, resp. rate 17, height 5' (1.524 m), weight 51.7 kg (114 lb), last menstrual period 09/29/2017, SpO2 100 %.Body mass index is 22.26 kg/m.  General Appearance: Casual  Eye Contact:  Fair  Speech:  Slow  Volume:  Decreased  Mood:  Depressed  Affect:  Constricted  Thought Process:  Goal Directed  Orientation:  Full (Time, Place, and Person)  Thought Content:  Logical  Suicidal Thoughts:  Yes.  without intent/plan  Homicidal Thoughts:  No  Memory:  Immediate;   Fair Recent;   Fair Remote;   Fair  Judgement:  Fair  Insight:  Fair  Psychomotor Activity:  Decreased  Concentration:  Concentration: Fair  Recall:  [employer] of Knowledge:  Fair  Language:  Fair  Akathisia:  No  Handed:  Right  AIMS (if indicated):     Assets:  Desire for Improvement Resilience  ADL's:  Intact  Cognition:  WNL  Sleep:  Number of Hours: 4.15     Treatment Plan Summary: Daily contact with patient to assess and evaluate symptoms and  progress in treatment, Medication management and Plan Patient here with severe depression with primary modality of treatment to be ECT.  Tolerated treatment today and the next ECT is scheduled for Wednesday.  She has no complaints.  Medications appear stable and appropriate no need to make any changes to anything acutely today.  Encourage patient to get up and attend groups and make sure she is eating.  Patient is doing a fine job taking care of herself independently despite her paraplegia.  No change to any treatment around that.  Social work will continue to talk with her about possible changes to disposition.  Mordecai RasmussenJohn Clapacs, MD 10/24/2017, 5:04 PM

## 2017-10-24 NOTE — Progress Notes (Signed)
Recreation Therapy Notes  Date: 11.12.18  Time: 3:00pm  Location: Craft room  Behavioral response: N/A  Group Type: Craft  Participation level: N/A  Communication: Patient did not attend group.  Comments: Patient did not attend group.  Jassiel Flye LRT/CTRS        Gisel Vipond 10/24/2017 4:02 PM 

## 2017-10-24 NOTE — Anesthesia Preprocedure Evaluation (Signed)
Anesthesia Evaluation  Patient identified by MRN, date of birth, ID band Patient awake    Reviewed: Allergy & Precautions, NPO status , Patient's Chart, lab work & pertinent test results  History of Anesthesia Complications Negative for: history of anesthetic complications  Airway Mallampati: II  TM Distance: >3 FB Neck ROM: Full    Dental no notable dental hx.    Pulmonary neg sleep apnea, neg COPD, former smoker,    breath sounds clear to auscultation- rhonchi (-) wheezing      Cardiovascular Exercise Tolerance: Good (-) hypertension(-) CAD, (-) Past MI and (-) Cardiac Stents  Rhythm:Regular Rate:Normal - Systolic murmurs and - Diastolic murmurs    Neuro/Psych PSYCHIATRIC DISORDERS Anxiety Depression Hx of spinal cord injury with R sided weakness     GI/Hepatic negative GI ROS, Neg liver ROS,   Endo/Other  negative endocrine ROSneg diabetes  Renal/GU negative Renal ROS     Musculoskeletal negative musculoskeletal ROS (+)   Abdominal (+) - obese,   Peds  Hematology negative hematology ROS (+)   Anesthesia Other Findings Past Medical History: No date: C6 spinal cord injury (HCC) No date: Depression   Reproductive/Obstetrics                             Anesthesia Physical Anesthesia Plan  ASA: II  Anesthesia Plan: General   Post-op Pain Management:    Induction: Intravenous  PONV Risk Score and Plan: 2 and Ondansetron  Airway Management Planned: Mask  Additional Equipment:   Intra-op Plan:   Post-operative Plan:   Informed Consent: I have reviewed the patients History and Physical, chart, labs and discussed the procedure including the risks, benefits and alternatives for the proposed anesthesia with the patient or authorized representative who has indicated his/her understanding and acceptance.   Dental advisory given  Plan Discussed with: CRNA and  Anesthesiologist  Anesthesia Plan Comments:         Anesthesia Quick Evaluation

## 2017-10-24 NOTE — H&P (Signed)
Up in the dayroom playing cards with peers. Pleasant on contact. Endorses depression but feels better, no clo anxiety. EKG done. Offered to assist with shower, declined for now. Reminded her that she is NPO after midnight. Trazodone given for sleep. Discussed her living situation. Is alone and states the isolation is very difficult. She will discuss this with her Child psychotherapistsocial worker. Remains on routine obs for safety.

## 2017-10-24 NOTE — Anesthesia Post-op Follow-up Note (Signed)
Anesthesia QCDR form completed.        

## 2017-10-24 NOTE — Procedures (Signed)
ECT SERVICES Physician's Interval Evaluation & Treatment Note  Patient Identification: Natasha SouthwardJenifer Katherine Hinch MRN:  [id number] Date of Evaluation:  10/24/2017 TX #: 1  MADRS: 43  MMSE: 30  P.E. Findings:  Patient is paraplegic chronically with diminished tone and some diminished usage of upper extremities as well due to C6 fracture chronic.  Otherwise no significant finding.  Psychiatric Interval Note:  Depression severe without psychotic features  Subjective:  Patient is a 46 y.o. female seen for evaluation for Electroconvulsive Therapy. Severe depression worse than average  Treatment Summary:   [x]   Right Unilateral             []  Bilateral   % Energy : 0.3 ms 30%   Impedance: 1760 ohms  Seizure Energy Index: 6701 V squared  Postictal Suppression Index: 91%  Seizure Concordance Index: 98%  Medications  Pre Shock: Robinul 0.1 mg Toradol 30 mg Brevital 50 mg succinylcholine 60 mg  Post Shock:    Seizure Duration: 13 seconds by EMG 70 seconds by EEG   Comments: Plan for 3 times a week treatments inpatient as long as necessary to return her to a safe baseline.  Lungs:  [x]   Clear to auscultation               []  Other:   Heart:    [x]   Regular rhythm             []  irregular rhythm    [x]   Previous H&P reviewed, patient examined and there are NO CHANGES                 []   Previous H&P reviewed, patient examined and there are changes noted.   Mordecai RasmussenJohn Clapacs, MD 11/12/201810:29 AM

## 2017-10-24 NOTE — Progress Notes (Signed)
Pt. Arrived from ECT at this time.

## 2017-10-24 NOTE — Progress Notes (Signed)
Recreation Therapy Notes   Date: 11.12.18  Time: 1:00pm  Location: Craft Room  Behavioral response: N/A  Intervention Topic: Creative Expressions  Discussion/Intervention: Patient did not attend group.  Clinical Observations/Feedback:  Patient did not attend group.  Raina Sole LRT/CTRS         Alessander Sikorski 10/24/2017 1:58 PM

## 2017-10-24 NOTE — H&P (Signed)
Natasha [name] is an 46 y.o. female.   Chief Complaint: Ongoing depression with suicidal ideation HPI: History of recurrent severe depression with good response to ECT continuing with brief return to index course  Past Medical History:  Diagnosis Date  . C6 spinal cord injury (HCC)   . Depression     Past Surgical History:  Procedure Laterality Date  . BACK SURGERY    . NECK SURGERY      Family History  Problem Relation Age of Onset  . Alcohol abuse Father   . Alcohol abuse Brother   . Depression Mother   . Alcohol abuse Mother    Social History:  reports that she has quit smoking. she has never used smokeless tobacco. She reports that she does not drink alcohol or use drugs.  Allergies: No Known Allergies  Medications Prior to Admission  Medication Sig Dispense Refill  . acetaminophen (TYLENOL) 325 MG tablet Take 2 tablets (650 mg total) every 6 (six) hours as needed by mouth for mild pain.    [name] Kitchen alum & mag hydroxide-simeth (MAALOX/MYLANTA) 200-200-20 MG/5ML suspension Take 30 mLs every 4 (four) hours as needed by mouth for indigestion. 355 mL 0  . busPIRone (BUSPAR) 10 MG tablet Take 1 tablet (10 mg total) 3 (three) times daily by mouth.    . clonazePAM (KLONOPIN) 0.5 MG tablet Take 1 tablet (0.5 mg total) 2 (two) times daily as needed by mouth (anxiety). 30 tablet 0  . DULoxetine (CYMBALTA) 60 MG capsule Take 1 capsule (60 mg total) daily by mouth.  3  . feeding supplement, ENSURE ENLIVE, (ENSURE ENLIVE) LIQD Take 237 mLs 2 (two) times daily between meals by mouth. 237 mL 12  . magnesium hydroxide (MILK OF MAGNESIA) 400 MG/5ML suspension Take 30 mLs daily as needed by mouth for mild constipation. 360 mL 0  . ondansetron (ZOFRAN) 4 MG tablet Take 1 tablet (4 mg total) every 8 (eight) hours as needed by mouth for nausea or vomiting. 20 tablet 0  . QUEtiapine (SEROQUEL) 25 MG tablet Take 1 tablet (25 mg total) 2 (two) times daily by mouth.    . traZODone (DESYREL) 100 MG  tablet Take 1 tablet (100 mg total) at bedtime as needed by mouth for sleep.      Results for orders placed or performed during the hospital encounter of 10/21/17 (from the past 48 hour(s))  Urinalysis, Complete w Microscopic     Status: Abnormal   Collection Time: 10/23/17  6:15 AM  Result Value Ref Range   Color, Urine YELLOW (A) YELLOW   APPearance HAZY (A) CLEAR   Specific Gravity, Urine 1.015 1.005 - 1.030   pH 5.0 5.0 - 8.0   Glucose, UA NEGATIVE NEGATIVE mg/dL   Hgb urine dipstick NEGATIVE NEGATIVE   Bilirubin Urine NEGATIVE NEGATIVE   Ketones, ur 5 (A) NEGATIVE mg/dL   Protein, ur NEGATIVE NEGATIVE mg/dL   Nitrite NEGATIVE NEGATIVE   Leukocytes, UA NEGATIVE NEGATIVE   RBC / HPF 0-5 0 - 5 RBC/hpf   WBC, UA 0-5 0 - 5 WBC/hpf   Bacteria, UA MANY (A) NONE SEEN   Squamous Epithelial / LPF 0-5 (A) NONE SEEN   Mucus PRESENT   Lipid panel     Status: Abnormal   Collection Time: 10/23/17  6:37 AM  Result Value Ref Range   Cholesterol 181 0 - 200 mg/dL   Triglycerides 56 <161 mg/dL   HDL 46 >09 mg/dL   Total CHOL/HDL Ratio 3.9 RATIO  VLDL 11 0 - 40 mg/dL   LDL Cholesterol 829124 (H) 0 - 99 mg/dL    Comment:        Total Cholesterol/HDL:CHD Risk Coronary Heart Disease Risk Table                     Men   Women  1/2 Average Risk   3.4   3.3  Average Risk       5.0   4.4  2 X Average Risk   9.6   7.1  3 X Average Risk  23.4   11.0        Use the calculated Patient Ratio above and the CHD Risk Table to determine the patient's CHD Risk.        ATP III CLASSIFICATION (LDL):  <100     mg/dL   Optimal  562-130100-129  mg/dL   Near or Above                    Optimal  130-159  mg/dL   Borderline  865-784160-189  mg/dL   High  >696>190     mg/dL   Very High   Hemoglobin A1c     Status: Abnormal   Collection Time: 10/23/17  6:37 AM  Result Value Ref Range   Hgb A1c MFr Bld 4.7 (L) 4.8 - 5.6 %    Comment: (NOTE) Pre diabetes:          5.7%-6.4% Diabetes:              >6.4% Glycemic control  for   <7.0% adults with diabetes    Mean Plasma Glucose 88.19 mg/dL    Comment: Performed at [facility], [address] [place], [place] [address]  Glucose, capillary     Status: None   Collection Time: 10/24/17  6:49 AM  Result Value Ref Range   Glucose-Capillary 95 65 - 99 mg/dL   Comment 1 Notify RN    No results found.  Review of Systems  HENT: Negative.   Eyes: Negative.   Respiratory: Negative.   Cardiovascular: Negative.   Gastrointestinal: Negative.   Musculoskeletal: Negative.   Skin: Negative.   Neurological: Positive for sensory change, focal weakness and weakness.  Psychiatric/Behavioral: Positive for depression and suicidal ideas. Negative for hallucinations, memory loss and substance abuse. The patient is nervous/anxious. The patient does not have insomnia.     Blood pressure 112/63, pulse 67, temperature 97.8 F (36.6 C), temperature source Oral, resp. rate 18, height 5' (1.524 m), weight 51.7 kg (114 lb), last menstrual period 09/29/2017, SpO2 96 %. Physical Exam  Nursing note and vitals reviewed. Constitutional: She appears well-developed and well-nourished.  HENT:  Head: Normocephalic and atraumatic.  Eyes: Conjunctivae are normal. Pupils are equal, round, and reactive to light.  Neck: Normal range of motion.  Cardiovascular: Regular rhythm and normal heart sounds.  Respiratory: Effort normal. No respiratory distress.  GI: Soft.  Musculoskeletal: Normal range of motion.  Neurological: She is alert.  Paraplegic and with some diminished use of upper extremities chronic  Skin: Skin is warm and dry.  Psychiatric: Judgment normal. Her affect is blunt. Her speech is delayed. She is slowed. Cognition and memory are normal. She exhibits a depressed mood. She expresses suicidal ideation. She expresses no suicidal plans.     Assessment/Plan Treatment today and continued 3 times a week index course schedule while monitoring for improvement leading to  discharge back to outpatient treatment  Mordecai RasmussenJohn Clapacs, MD 10/24/2017, 10:26 AM

## 2017-10-24 NOTE — Plan of Care (Signed)
Pt. Today compliant with medications and unit procedures. Pt. Today denies SI/HI and denies AVH. Pt. Reports progressing emotional state and outlook on current situation. Pt. Frequently seen in the milieu socializing with peers and participating in unit activities. Pt. Today had ECT treatment that she reports as, "helpful". Pt. After ECT reassessed and WDL's. Will continue to monitor. Pt. Did not go to groups today. Pt. Energy level seems reduced from ECT. Pt. Reports appetite as, "fair".

## 2017-10-25 ENCOUNTER — Other Ambulatory Visit: Payer: Self-pay | Admitting: Psychiatry

## 2017-10-25 NOTE — [facility] Group Notes (Signed)
[facility] Group Notes:  (Nursing/MHT/Case Management/Adjunct)  Date:  10/25/2017  Time:  2:48 PM  Type of Therapy:  Psychoeducational Skills  Participation Level:  Active  Participation Quality:  Appropriate, Sharing and Supportive  Affect:  Appropriate  Cognitive:  Appropriate and Oriented  Insight:  Appropriate  Engagement in Group:  Engaged and Supportive  Modes of Intervention:  Discussion and Education  Summary of Progress/Problems:  [name] 10/25/2017, 2:48 PM

## 2017-10-25 NOTE — [facility] Group Notes (Signed)
LCSW Group Therapy Note  10/25/2017 1:00PM  Type of Therapy/Topic:  Group Therapy:  Feelings about Diagnosis  Participation Level:  Minimal   Description of Group:   This group will allow patients to explore their thoughts and feelings about diagnoses they have received. Patients will be guided to explore their level of understanding and acceptance of these diagnoses. Facilitator will encourage patients to process their thoughts and feelings about the reactions of others to their diagnosis and will guide patients in identifying ways to discuss their diagnosis with significant others in their lives. This group will be process-oriented, with patients participating in exploration of their own experiences, giving and receiving support, and processing challenge from other group members.   Therapeutic Goals: 1. Patient will demonstrate understanding of diagnosis as evidenced by identifying two or more symptoms of the disorder 2. Patient will be able to express two feelings regarding the diagnosis 3. Patient will demonstrate their ability to communicate their needs through discussion and/or role play  Summary of Patient Progress:  Haliegh participated minimally in process group. She shared her name with group participants and that she was aware of her diagnosis.  Shatara shared that she feels overwhelmed by having mental illness, but she is more concerned about how to best deal with it.       Therapeutic Modalities:   Cognitive Behavioral Therapy Brief Therapy Feelings Identification    Alease FrameSonya S Liandro Thelin, LCSW 10/25/2017 2:05 PM

## 2017-10-25 NOTE — Progress Notes (Signed)
Patient ID: Natasha SouthwardJenifer Katherine Fontanez, female   DOB: [birth year], 46 y.o.   MRN: [id number] Observed in the wheel chair, in the day room, reading from a novel in her lap, socializing and playing games with staffs and peers; pleasant with a welcoming, smiling affect, jovial, "I am feeling better, much better after the ECT, my mind not as clustered, I just have a mild headache.." c/o 6/10 HA, Post-ECT effects, Tylenol 650 mg given for headache; Trazodone 100 mg given for insomnia; paraplegic but able to stand up holding on to the counter top in the med room. Pt is encouraged to call for help but self assured "I am okay.[name] Kitchen."

## 2017-10-25 NOTE — Progress Notes (Signed)
D: Patient remains in wheelchair  Through shift . Patient able to manage all her ADL's and personal chores . Attending  Unit programming. Affect  Cheerful  On approach . Noted to interact with her peers and staff.  Patient stated slept fair last night .Stated appetite is fair and energy level low. Stated concentration is good . Stated on Depression scale , hopeless and anxiety .( low 0-10 high)Voiced of  suicidal  Ideations voice of the conflict with her religion  .  No auditory hallucinations  No pain concerns . Appropriate ADL'S. Interacting with peers and staff.  A: Encourage patient participation with unit programming . Instruction  Given on  Medication , verbalize understanding. R: Voice no other concerns. Staff continue to monitor

## 2017-10-25 NOTE — [facility] Group Notes (Signed)
LCSW Group Therapy Note 10/25/2017 9:00am  Type of Therapy and Topic:  Group Therapy:  Setting Goals  Participation Level:  Did Not Attend  Description of Group: In this process group, patients discussed using strengths to work toward goals and address challenges.  Patients identified two positive things about themselves and one goal they were working on.  Patients were given the opportunity to share openly and support each other's plan for self-empowerment.  The group discussed the value of gratitude and were encouraged to have a daily reflection of positive characteristics or circumstances.  Patients were encouraged to identify a plan to utilize their strengths to work on current challenges and goals.  Therapeutic Goals 1. Patient will verbalize personal strengths/positive qualities and relate how these can assist with achieving desired personal goals 2. Patients will verbalize affirmation of peers plans for personal change and goal setting 3. Patients will explore the value of gratitude and positive focus as related to successful achievement of goals 4. Patients will verbalize a plan for regular reinforcement of personal positive qualities and circumstances.  Summary of Patient Progress:       Therapeutic Modalities Cognitive Behavioral Therapy Motivational Interviewing    Glennon MacSara P Karia Ehresman, LCSW 10/25/2017 11:02 AM

## 2017-10-25 NOTE — Progress Notes (Signed)
[facility]  10/25/2017 6:40 PM Natasha [name]  MRN:  [id number] Subjective: Patient seen this evening.  She reports that this morning she was feeling significantly better which is somewhat out of the ordinary for her.  As the day progressed she has felt more depressed.  Still has passive suicidal thoughts with no intent or plan.  Energy level has remained adequate throughout the day.  She is not showing any signs of psychosis.  She is participating in treatment appropriately.  No new complaints. Principal Problem: Severe recurrent major depression without psychotic features (HCC) Diagnosis:   Patient Active Problem List   Diagnosis Date Noted  . Severe recurrent major depression without psychotic features (HCC) [F33.2] 10/21/2017  . Paraplegia (HCC) [G82.20] 10/21/2017  . GAD (generalized anxiety disorder) [F41.1] 10/20/2017  . MDD (major depressive disorder), recurrent episode, severe (HCC) [F33.2] 10/18/2017  . Major depressive disorder, recurrent, severe without psychotic features (HCC) [F33.2]    Total Time spent with patient: 15 minutes  Past Psychiatric History: Long-standing major depression recurrent severe with response to ECT  Past Medical History:  Past Medical History:  Diagnosis Date  . C6 spinal cord injury (HCC)   . Depression     Past Surgical History:  Procedure Laterality Date  . BACK SURGERY    . NECK SURGERY     Family History:  Family History  Problem Relation Age of Onset  . Alcohol abuse Father   . Alcohol abuse Brother   . Depression Mother   . Alcohol abuse Mother    Family Psychiatric  History: Positive depression in several people Social History:  Social History   Substance and Sexual Activity  Alcohol Use No  . Alcohol/week: 0.0 oz   Comment: Occasional use     Social History   Substance and Sexual Activity  Drug Use No    Social History   Socioeconomic History  . Marital status: Single    Spouse name: None  .  Number of children: None  . Years of education: None  . Highest education level: None  Social Needs  . Financial resource strain: None  . Food insecurity - worry: None  . Food insecurity - inability: None  . Transportation needs - medical: None  . Transportation needs - non-medical: None  Occupational History  . None  Tobacco Use  . Smoking status: Former Games developermoker  . Smokeless tobacco: Never Used  . Tobacco comment: No smoking hx; no need for cessation materials  Substance and Sexual Activity  . Alcohol use: No    Alcohol/week: 0.0 oz    Comment: Occasional use  . Drug use: No  . Sexual activity: No  Other Topics Concern  . None  Social History Narrative  . None   Additional Social History:                         Sleep: Fair  Appetite:  Fair  Current Medications: Current Facility-Administered Medications  Medication Dose Route Frequency Provider Last Rate Last Dose  . acetaminophen (TYLENOL) tablet 650 mg  650 mg Oral Q6H PRN Joanna Borawski, Jackquline DenmarkJohn T, MD   650 mg at 10/24/17 2016  . alum & mag hydroxide-simeth (MAALOX/MYLANTA) 200-200-20 MG/5ML suspension 30 mL  30 mL Oral Q4H PRN Roberts Bon T, MD      . busPIRone (BUSPAR) tablet 10 mg  10 mg Oral TID Tynan Boesel, Jackquline DenmarkJohn T, MD   10 mg at 10/25/17 1744  . clonazePAM (  KLONOPIN) tablet 0.5 mg  0.5 mg Oral BID PRN Icker Swigert T, MD      . DULoxetine (CYMBALTA) DR capsule 60 mg  60 mg Oral Daily Shawni Volkov, Jackquline DenmarkJohn T, MD   60 mg at 10/25/17 0802  . magnesium hydroxide (MILK OF MAGNESIA) suspension 30 mL  30 mL Oral Daily PRN Judy Goodenow T, MD      . QUEtiapine (SEROQUEL) tablet 25 mg  25 mg Oral BID Terrie Grajales, Jackquline DenmarkJohn T, MD   25 mg at 10/25/17 1744  . traZODone (DESYREL) tablet 100 mg  100 mg Oral QHS PRN Deosha Werden, Jackquline DenmarkJohn T, MD   100 mg at 10/24/17 2311    Lab Results:  Results for orders placed or performed during the hospital encounter of 10/21/17 (from the past 48 hour(s))  Glucose, capillary     Status: None   Collection Time:  10/24/17  6:49 AM  Result Value Ref Range   Glucose-Capillary 95 65 - 99 mg/dL   Comment 1 Notify RN     Blood Alcohol level:  Lab Results  Component Value Date   ETH <10 10/18/2017   ETH <5 08/15/2017    Metabolic Disorder Labs: Lab Results  Component Value Date   HGBA1C 4.7 (L) 10/23/2017   MPG 88.19 10/23/2017   MPG 88 02/03/2017   Lab Results  Component Value Date   PROLACTIN 121.9 (H) 02/03/2017   Lab Results  Component Value Date   CHOL 181 10/23/2017   TRIG 56 10/23/2017   HDL 46 10/23/2017   CHOLHDL 3.9 10/23/2017   VLDL 11 10/23/2017   LDLCALC 124 (H) 10/23/2017   LDLCALC 149 (H) 02/03/2017    Physical Findings: AIMS:  , ,  ,  ,    CIWA:    COWS:     Musculoskeletal: Strength & Muscle Tone: decreased Gait & Station: unable to stand Patient leans: N/A  Psychiatric Specialty Exam: Physical Exam  Nursing note and vitals reviewed. Constitutional: She appears well-developed and well-nourished.  HENT:  Head: Normocephalic and atraumatic.  Eyes: Conjunctivae are normal. Pupils are equal, round, and reactive to light.  Neck: Normal range of motion.  Cardiovascular: Regular rhythm and normal heart sounds.  Respiratory: Effort normal.  GI: Soft.  Musculoskeletal: Normal range of motion.  Neurological: She is alert.  As noted previously patient is paraplegic due to cervical spine fracture and this is chronic no changes  Skin: Skin is warm and dry.  Psychiatric: Judgment normal. Her speech is delayed. She is slowed. Cognition and memory are normal. She exhibits a depressed mood. She expresses suicidal ideation. She expresses no suicidal plans.    Review of Systems  Constitutional: Negative.   HENT: Negative.   Eyes: Negative.   Respiratory: Negative.   Cardiovascular: Negative.   Gastrointestinal: Negative.   Musculoskeletal: Negative.   Skin: Negative.   Neurological: Positive for focal weakness.  Psychiatric/Behavioral: Positive for depression  and suicidal ideas. Negative for hallucinations, memory loss and substance abuse. The patient has insomnia. The patient is not nervous/anxious.     Blood pressure (!) 130/98, pulse 93, temperature 98.5 F (36.9 C), resp. rate 17, height 5' (1.524 m), weight 51.7 kg (114 lb), last menstrual period 09/29/2017, SpO2 100 %.Body mass index is 22.26 kg/m.  General Appearance: Casual  Eye Contact:  Fair  Speech:  Clear and Coherent and Slow  Volume:  Decreased  Mood:  Depressed  Affect:  Congruent  Thought Process:  Goal Directed  Orientation:  Full (Time, Place, and  Person)  Thought Content:  Logical  Suicidal Thoughts:  Yes.  without intent/plan  Homicidal Thoughts:  No  Memory:  Immediate;   Fair Recent;   Fair Remote;   Fair  Judgement:  Fair  Insight:  Fair  Psychomotor Activity:  Decreased  Concentration:  Concentration: Fair  Recall:  [employer] of Knowledge:  Fair  Language:  Fair  Akathisia:  No  Handed:  Right  AIMS (if indicated):     Assets:  Desire for Improvement Housing Resilience  ADL's:  Intact  Cognition:  WNL  Sleep:  Number of Hours: 4.15     Treatment Plan Summary: Daily contact with patient to assess and evaluate symptoms and progress in treatment, Medication management and Plan Patient seen chart reviewed.  Patient is showing good tolerance and possible early response to ECT.  Plan is to continue ECT 3 times a week next treatment tomorrow morning.  She is complaining of some difficulty sleeping at night.  We may add an extra medication to help with as needed sleeping by increasing the nighttime Seroquel.  Otherwise no change to medication.  Encouragement to attend groups.  Treatment team is involved in planning.  Mordecai Rasmussen, MD 10/25/2017, 6:40 PM

## 2017-10-25 NOTE — Progress Notes (Signed)
Recreation Therapy Notes  Date: 11.13.18  Time: 1:00pm  Location: Craft Room  Behavioral response: N/A  Intervention Topic: Stress  Discussion/Intervention: Patient did not attend group. Clinical Observations/Feedback:  Patient did not attend group.  Natasha [name] LRT/CTRS         Natasha [name] 10/25/2017 4:22 PM 

## 2017-10-25 NOTE — [facility] Group Notes (Signed)
[facility] Group Notes:  (Nursing/MHT/Case Management/Adjunct)  Date:  10/25/2017  Time:  9:20 PM  Type of Therapy:  Evening Wrap-up Group  Participation Level:  Active  Participation Quality:  Appropriate and Attentive  Affect:  Appropriate  Cognitive:  Alert and Appropriate  Insight:  Appropriate, Good and Improving  Engagement in Group:  Developing/Improving and Engaged  Modes of Intervention:  Discussion  Summary of Progress/Problems:  Natasha MorrowChelsea [name] Natasha [name] 10/25/2017, 9:20 PM

## 2017-10-25 NOTE — Plan of Care (Signed)
Patient slept for Estimated Hours of 4.15; Precautionary checks every 15 minutes for safety maintained, room free of safety hazards, patient sustains no injury or falls during this shift.  

## 2017-10-25 NOTE — Progress Notes (Signed)
Recreation Therapy Notes  Date: 11.13.18  Time: 3:00pm  Location: Craft room  Behavioral response: Appropriate   Group Type: Craft  Participation level: Active  Communication: Patient was social with peers and staff during group.  Comments: N/A  Keerthana Vanrossum LRT/CTRS        Zarian Colpitts 10/25/2017 4:29 PM

## 2017-10-25 NOTE — Plan of Care (Signed)
Attending  unit programing , appetite good , compliant  with  unit  medications  Verbalizing  feeling  in group .  Working  on Materials engineercoping  skills . Able to  access  resources  with social worker  No injuries  noted . Voice no concerns around sleep

## 2017-10-25 NOTE — [facility] Group Notes (Signed)
LCSW Group Therapy Note  10/25/2017 9:30AM  Type of Therapy/Topic:  Group Therapy:  Feelings about Diagnosis  Participation Level:  Did Not Attend   Description of Group:   This group will allow patients to explore their thoughts and feelings about diagnoses they have received. Patients will be guided to explore their level of understanding and acceptance of these diagnoses. Facilitator will encourage patients to process their thoughts and feelings about the reactions of others to their diagnosis and will guide patients in identifying ways to discuss their diagnosis with significant others in their lives. This group will be process-oriented, with patients participating in exploration of their own experiences, giving and receiving support, and processing challenge from other group members.   Therapeutic Goals: 1. Patient will demonstrate understanding of diagnosis as evidenced by identifying two or more symptoms of the disorder 2. Patient will be able to express two feelings regarding the diagnosis 3. Patient will demonstrate their ability to communicate their needs through discussion and/or role play  Summary of Patient Progress:       Therapeutic Modalities:   Cognitive Behavioral Therapy Brief Therapy Feelings Identification    Alease FrameSonya S Henning Ehle, LCSW 10/25/2017 10:43 AM

## 2017-10-25 NOTE — Plan of Care (Signed)
Clinical and moral support provided

## 2017-10-26 ENCOUNTER — Inpatient Hospital Stay: Payer: Medicare Other

## 2017-10-26 ENCOUNTER — Inpatient Hospital Stay: Payer: Medicare Other | Admitting: Anesthesiology

## 2017-10-26 DIAGNOSIS — F339 Major depressive disorder, recurrent, unspecified: Secondary | ICD-10-CM | POA: Diagnosis not present

## 2017-10-26 DIAGNOSIS — F419 Anxiety disorder, unspecified: Secondary | ICD-10-CM | POA: Diagnosis not present

## 2017-10-26 LAB — POCT PREGNANCY, URINE: Preg Test, Ur: NEGATIVE

## 2017-10-26 LAB — GLUCOSE, CAPILLARY: Glucose-Capillary: 92 mg/dL (ref 65–99)

## 2017-10-26 MED ORDER — GLYCOPYRROLATE 0.2 MG/ML IJ SOLN
INTRAMUSCULAR | Status: AC
  Filled 2017-10-26: qty 1

## 2017-10-26 MED ORDER — SUCCINYLCHOLINE CHLORIDE 200 MG/10ML IV SOSY
PREFILLED_SYRINGE | INTRAVENOUS | Status: DC | PRN
  Administered 2017-10-26: 60 mg via INTRAVENOUS

## 2017-10-26 MED ORDER — GLYCOPYRROLATE 0.2 MG/ML IJ SOLN
0.1000 mg | Freq: Once | INTRAMUSCULAR | Status: AC
  Administered 2017-10-26: 0.1 mg via INTRAVENOUS

## 2017-10-26 MED ORDER — SODIUM CHLORIDE 0.9 % IV SOLN
INTRAVENOUS | Status: DC | PRN
  Administered 2017-10-26: 09:00:00 via INTRAVENOUS

## 2017-10-26 MED ORDER — FAMOTIDINE 20 MG PO TABS
20.0000 mg | ORAL_TABLET | Freq: Two times a day (BID) | ORAL | Status: DC
  Administered 2017-10-26 – 2017-11-01 (×12): 20 mg via ORAL
  Filled 2017-10-26 (×12): qty 1

## 2017-10-26 MED ORDER — TRAZODONE HCL 100 MG PO TABS
200.0000 mg | ORAL_TABLET | Freq: Every day | ORAL | Status: DC
  Administered 2017-10-26 – 2017-10-28 (×3): 200 mg via ORAL
  Filled 2017-10-26 (×3): qty 2

## 2017-10-26 MED ORDER — KETOROLAC TROMETHAMINE 30 MG/ML IJ SOLN
30.0000 mg | Freq: Once | INTRAMUSCULAR | Status: AC
  Administered 2017-10-26: 30 mg via INTRAVENOUS

## 2017-10-26 MED ORDER — METHOHEXITAL SODIUM 100 MG/10ML IV SOSY
PREFILLED_SYRINGE | INTRAVENOUS | Status: DC | PRN
Start: 2017-10-26 — End: 2017-10-26
  Administered 2017-10-26: 50 mg via INTRAVENOUS

## 2017-10-26 MED ORDER — SUCCINYLCHOLINE CHLORIDE 20 MG/ML IJ SOLN
INTRAMUSCULAR | Status: AC
  Filled 2017-10-26: qty 1

## 2017-10-26 MED ORDER — KETOROLAC TROMETHAMINE 30 MG/ML IJ SOLN
INTRAMUSCULAR | Status: AC
  Administered 2017-10-26: 30 mg via INTRAVENOUS
  Filled 2017-10-26: qty 1

## 2017-10-26 MED ORDER — SODIUM CHLORIDE 0.9 % IV SOLN
500.0000 mL | Freq: Once | INTRAVENOUS | Status: AC
  Administered 2017-10-26: 500 mL via INTRAVENOUS

## 2017-10-26 NOTE — Anesthesia Postprocedure Evaluation (Signed)
Anesthesia Post Note  Patient: Natasha SouthwardJenifer Katherine Braddy  Procedure(s) Performed: ECT TX  Patient location during evaluation: PACU Anesthesia Type: General Level of consciousness: awake and alert and oriented Pain management: pain level controlled Vital Signs Assessment: post-procedure vital signs reviewed and stable Respiratory status: spontaneous breathing Cardiovascular status: blood pressure returned to baseline Anesthetic complications: no     Last Vitals:  Vitals:   10/26/17 1451 10/26/17 1452  BP: 100/72 100/72  Pulse: 75   Resp: 16   Temp: 37 C   SpO2: 100%     Last Pain:  Vitals:   10/26/17 1451  TempSrc: Oral  PainSc: 0-No pain                 Sonam Huelsmann

## 2017-10-26 NOTE — Progress Notes (Signed)
[facility]  10/26/2017 8:40 PM Natasha [name]  MRN:  [id number] Subjective: Follow-up for Wednesday the 14th patient had ECT today treatment went fine no complication.  Patient seen this afternoon.  She reports feeling that her mood is gradually improving although she is still not feeling completely safe with the idea of going home.  She is not reporting psychotic symptoms.  Not having major side effects from the treatment.  Patient's affect was brighter.  She did continue to report poor sleep and was requesting that she be put back on medicine for gastric reflux. Principal Problem: Severe recurrent major depression without psychotic features (HCC) Diagnosis:   Patient Active Problem List   Diagnosis Date Noted  . Severe recurrent major depression without psychotic features (HCC) [F33.2] 10/21/2017  . Paraplegia (HCC) [G82.20] 10/21/2017  . GAD (generalized anxiety disorder) [F41.1] 10/20/2017  . MDD (major depressive disorder), recurrent episode, severe (HCC) [F33.2] 10/18/2017  . Major depressive disorder, recurrent, severe without psychotic features (HCC) [F33.2]    Total Time spent with patient: 30 minutes  Past Psychiatric History: Past history of long-standing chronic recurrent severe depression with positive suicide attempts.  Patient is responding to ECT  Past Medical History:  Past Medical History:  Diagnosis Date  . C6 spinal cord injury (HCC)   . Depression     Past Surgical History:  Procedure Laterality Date  . BACK SURGERY    . NECK SURGERY     Family History:  Family History  Problem Relation Age of Onset  . Alcohol abuse Father   . Alcohol abuse Brother   . Depression Mother   . Alcohol abuse Mother    Family Psychiatric  History: Positive for anxiety Social History:  Social History   Substance and Sexual Activity  Alcohol Use No  . Alcohol/week: 0.0 oz   Comment: Occasional use     Social History   Substance and Sexual Activity   Drug Use No    Social History   Socioeconomic History  . Marital status: Single    Spouse name: None  . Number of children: None  . Years of education: None  . Highest education level: None  Social Needs  . Financial resource strain: None  . Food insecurity - worry: None  . Food insecurity - inability: None  . Transportation needs - medical: None  . Transportation needs - non-medical: None  Occupational History  . None  Tobacco Use  . Smoking status: Former Games developermoker  . Smokeless tobacco: Never Used  . Tobacco comment: No smoking hx; no need for cessation materials  Substance and Sexual Activity  . Alcohol use: No    Alcohol/week: 0.0 oz    Comment: Occasional use  . Drug use: No  . Sexual activity: No  Other Topics Concern  . None  Social History Narrative  . None   Additional Social History:                         Sleep: Fair  Appetite:  Good  Current Medications: Current Facility-Administered Medications  Medication Dose Route Frequency Provider Last Rate Last Dose  . acetaminophen (TYLENOL) tablet 650 mg  650 mg Oral Q6H PRN Clapacs, Jackquline DenmarkJohn T, MD   650 mg at 10/24/17 2016  . alum & mag hydroxide-simeth (MAALOX/MYLANTA) 200-200-20 MG/5ML suspension 30 mL  30 mL Oral Q4H PRN Clapacs, John T, MD      . busPIRone (BUSPAR) tablet 10 mg  10 mg Oral TID Clapacs, Jackquline Denmark, MD   10 mg at 10/26/17 1701  . clonazePAM (KLONOPIN) tablet 0.5 mg  0.5 mg Oral BID PRN Clapacs, John T, MD      . DULoxetine (CYMBALTA) DR capsule 60 mg  60 mg Oral Daily Clapacs, Jackquline Denmark, MD   60 mg at 10/26/17 1407  . famotidine (PEPCID) tablet 20 mg  20 mg Oral BID Clapacs, John T, MD      . magnesium hydroxide (MILK OF MAGNESIA) suspension 30 mL  30 mL Oral Daily PRN Clapacs, John T, MD      . QUEtiapine (SEROQUEL) tablet 25 mg  25 mg Oral BID Clapacs, Jackquline Denmark, MD   25 mg at 10/26/17 1701  . traZODone (DESYREL) tablet 200 mg  200 mg Oral QHS Clapacs, Jackquline Denmark, MD        Lab Results:   Results for orders placed or performed during the hospital encounter of 10/21/17 (from the past 48 hour(s))  Glucose, capillary     Status: None   Collection Time: 10/26/17  7:07 AM  Result Value Ref Range   Glucose-Capillary 92 65 - 99 mg/dL   Comment 1 Notify RN   Pregnancy, urine POC     Status: None   Collection Time: 10/26/17  9:15 AM  Result Value Ref Range   Preg Test, Ur NEGATIVE NEGATIVE    Comment:        THE SENSITIVITY OF THIS METHODOLOGY IS >24 mIU/mL     Blood Alcohol level:  Lab Results  Component Value Date   ETH <10 10/18/2017   ETH <5 08/15/2017    Metabolic Disorder Labs: Lab Results  Component Value Date   HGBA1C 4.7 (L) 10/23/2017   MPG 88.19 10/23/2017   MPG 88 02/03/2017   Lab Results  Component Value Date   PROLACTIN 121.9 (H) 02/03/2017   Lab Results  Component Value Date   CHOL 181 10/23/2017   TRIG 56 10/23/2017   HDL 46 10/23/2017   CHOLHDL 3.9 10/23/2017   VLDL 11 10/23/2017   LDLCALC 124 (H) 10/23/2017   LDLCALC 149 (H) 02/03/2017    Physical Findings: AIMS:  , ,  ,  ,    CIWA:    COWS:     Musculoskeletal: Strength & Muscle Tone: within normal limits Gait & Station: unable to stand Patient leans: N/A  Psychiatric Specialty Exam: Physical Exam  Nursing note and vitals reviewed. Constitutional: She appears well-developed and well-nourished.  HENT:  Head: Normocephalic and atraumatic.  Eyes: Conjunctivae are normal. Pupils are equal, round, and reactive to light.  Neck: Normal range of motion.  Cardiovascular: Regular rhythm and normal heart sounds.  Respiratory: Effort normal. No respiratory distress.  GI: Soft.  Musculoskeletal: Normal range of motion.  Neurological: She is alert.  Paraplegic no change  Skin: Skin is warm and dry.  Psychiatric: Her behavior is normal. Judgment and thought content normal. Her speech is delayed. Cognition and memory are normal. She exhibits a depressed mood.    Review of Systems   Constitutional: Negative.   HENT: Negative.   Eyes: Negative.   Respiratory: Negative.   Cardiovascular: Negative.   Gastrointestinal: Negative.   Musculoskeletal: Negative.   Skin: Negative.   Neurological: Positive for focal weakness.  Psychiatric/Behavioral: Positive for depression and suicidal ideas. Negative for hallucinations, memory loss and substance abuse. The patient has insomnia. The patient is not nervous/anxious.     Blood pressure 100/72, pulse 75, temperature 98.6  F (37 C), temperature source Oral, resp. rate 16, height 5' (1.524 m), weight 51.7 kg (114 lb), last menstrual period 09/29/2017, SpO2 100 %.Body mass index is 22.26 kg/m.  General Appearance: Fairly Groomed  Eye Contact:  Good  Speech:  Clear and Coherent  Volume:  Normal  Mood:  Dysphoric  Affect:  Congruent  Thought Process:  Goal Directed  Orientation:  Full (Time, Place, and Person)  Thought Content:  Logical  Suicidal Thoughts:  Yes.  without intent/plan  Homicidal Thoughts:  No  Memory:  Immediate;   Good Recent;   Fair Remote;   Fair  Judgement:  Fair  Insight:  Fair  Psychomotor Activity:  Decreased  Concentration:  Concentration: Good  Recall:  Good  Fund of Knowledge:  Good  Language:  Good  Akathisia:  No  Handed:  Right  AIMS (if indicated):     Assets:  Desire for Improvement Housing Resilience  ADL's:  Intact  Cognition:  WNL  Sleep:  Number of Hours: 5.15     Treatment Plan Summary: Daily contact with patient to assess and evaluate symptoms and progress in treatment, Medication management and Plan 46 year old woman with severe depression who is gradually getting better with ECT.  Patient is tolerating treatment well and agrees to plan to continue ECT with next treatment scheduled for Friday.  As we discussed previously I am increasing her trazodone to 200 mg at night to assist with sleep and we will also add Pepcid twice a day to help with acid reflux.  Supportive counseling  and review of treatment plan.  Mordecai RasmussenJohn Clapacs, MD 10/26/2017, 8:40 PM

## 2017-10-26 NOTE — Transfer of Care (Signed)
Immediate Anesthesia Transfer of Care Note  Patient: Natasha SouthwardJenifer Katherine Lancon  Procedure(s) Performed: ECT TX  Patient Location: PACU  Anesthesia Type:General  Level of Consciousness: sedated  Airway & Oxygen Therapy: Patient Spontanous Breathing and Patient connected to face mask oxygen  Post-op Assessment: Report given to RN and Post -op Vital signs reviewed and stable  Post vital signs: Reviewed and stable  Last Vitals:  Vitals:   10/26/17 0640 10/26/17 0927  BP: 100/65 112/81  Pulse: 89 72  Resp: 16 16  Temp: 37 C 36.7 C  SpO2: 100% 100%    Last Pain:  Vitals:   10/26/17 0927  TempSrc: Oral  PainSc: 4       Patients Stated Pain Goal: 0 (10/24/17 2016)  Complications: No apparent anesthesia complications

## 2017-10-26 NOTE — [facility] Group Notes (Signed)
[facility] Group Notes:  (Nursing/MHT/Case Management/Adjunct)  Date:  10/26/2017  Time:  11:03 PM  Type of Therapy:  Psychoeducational Skills  Participation Level:  Active  Participation Quality:  Appropriate and Attentive  Affect:  Appropriate and Flat  Cognitive:  Alert and Appropriate  Insight:  Appropriate and Good  Engagement in Group:  Engaged and Limited  Modes of Intervention:  Discussion and Education  Summary of Progress/Problems:  Natasha CompanionMary  Cana Mignano 10/26/2017, 11:03 PM

## 2017-10-26 NOTE — Progress Notes (Signed)
D: Patient alert and oriented. Affect/mood: Calm, cooperative. Reports sleeping "fair", with sleep aid medication. Reports "fair" appetite, "low" energy level, "poor" concentration. Rates depression "7" (0-10), hopelessness "8" (0-10), anxiety "7" (0-10). Denies pain. Goal: "successful ECT". Reports achieving this goal.  A: AM scheduled medications not given due to patient being off the unit receiving therapy. Scheduled medications administered to patient per MD order when returned. Support and encouragement provided. Routine safety checks conducted every 15 minutes. Patient informed to notify staff with problems or concerns. Encouraged to talk to staff or other is feeling of harm toward self or others arise. Patient agrees.  R: No adverse drug reactions noted. Patient contracts for safety at this time. Patient compliant with medications and treatment plan. Patient receptive, calm, and cooperative. Patient interacts well with others on the unit. Patient remains safe at this time.

## 2017-10-26 NOTE — Progress Notes (Signed)
Patient ID: Natasha SouthwardJenifer [name], female   DOB: 30-Mar-1971, 46 y.o.   MRN: [id number] Mood and affect appropriate, interacting well with peers, denied SI/HI during this shift, anticipated up coming ECT discussed: NPO past MN, questions encouraged, understanding verbalized. A&O3, denied pain, received, at her request, Trazodone 100 mg for insomnia at bedtime.

## 2017-10-26 NOTE — Procedures (Signed)
[facility] SERVICES Physician's Interval Evaluation & Treatment Note  Patient Identification: Natasha [name] MRN:  [id number] Date of Evaluation:  10/26/2017 TX #: 2  MADRS:   MMSE:   P.E. Findings:  No change to physical exam  Psychiatric Interval Note:  Mood not much different may be slightly better  Subjective:  Patient is a 46 y.o. female seen for evaluation for Electroconvulsive Therapy. No new complaint  Treatment Summary:   [x]   Right Unilateral             []  Bilateral   % Energy : 0.3 ms 30%   Impedance: 2040 ohms  Seizure Energy Index: 6005 V squared  Postictal Suppression Index: 90%  Seizure Concordance Index: 99%  Medications  Pre Shock: Robinul 0.1 mg Toradol 30 mg Brevital 50 mg succinylcholine 60 mg  Post Shock:    Seizure Duration: 67 seconds by EMG 87 seconds by EEG   Comments: Plan is to continue with 3 times a week treatment monitoring for improvement aiming for discharge.  Next treatment Friday.  Lungs:  [x]   Clear to auscultation               []  Other:   Heart:    [x]   Regular rhythm             []  irregular rhythm    [x]   Previous H&P reviewed, patient examined and there are NO CHANGES                 []   Previous H&P reviewed, patient examined and there are changes noted.   Mordecai RasmussenJohn Clapacs, MD 11/14/201811:24 AM

## 2017-10-26 NOTE — Plan of Care (Signed)
Patient slept for Estimated Hours of 5.15; Precautionary checks every 15 minutes for safety maintained, room free of safety hazards, patient sustains no injury or falls during this shift. NPO after MN for ECT

## 2017-10-26 NOTE — [facility] Group Notes (Signed)
  [facility] LCSW Group Therapy Note  Date/Time: 10/26/17, 0930  Type of Therapy/Topic:  Group Therapy:  Emotion Regulation  Participation Level:  Did Not Attend   Mood:  Description of Group:    The purpose of this group is to assist patients in learning to regulate negative emotions and experience positive emotions. Patients will be guided to discuss ways in which they have been vulnerable to their negative emotions. These vulnerabilities will be juxtaposed with experiences of positive emotions or situations, and patients challenged to use positive emotions to combat negative ones. Special emphasis will be placed on coping with negative emotions in conflict situations, and patients will process healthy conflict resolution skills.  Therapeutic Goals: 1. Patient will identify two positive emotions or experiences to reflect on in order to balance out negative emotions:  2. Patient will label two or more emotions that they find the most difficult to experience:  3. Patient will be able to demonstrate positive conflict resolution skills through discussion or role plays:   Summary of Patient Progress:       Therapeutic Modalities:   Cognitive Behavioral Therapy Feelings Identification Dialectical Behavioral Therapy  Greg Bennie Chirico, LCSW 

## 2017-10-26 NOTE — Anesthesia Procedure Notes (Signed)
Date/Time: 10/26/2017 11:31 AM Performed by: Lily KocherPeralta, Hoke Baer, CRNA Pre-anesthesia Checklist: Patient identified, Emergency Drugs available, Suction available and Patient being monitored Patient Re-evaluated:Patient Re-evaluated prior to induction Oxygen Delivery Method: Circle system utilized Preoxygenation: Pre-oxygenation with 100% oxygen Induction Type: IV induction Ventilation: Mask ventilation without difficulty and Mask ventilation throughout procedure Airway Equipment and Method: Bite block Placement Confirmation: positive ETCO2 Dental Injury: Teeth and Oropharynx as per pre-operative assessment

## 2017-10-26 NOTE — Plan of Care (Signed)
Attending  unit   ;programs . Continue to use wheelchair as a means of move ing about unit  Affect pleasant on approach  Patient escorted to ECT by orderly this am

## 2017-10-26 NOTE — [facility] Group Notes (Signed)
[facility] Group Notes:  (Nursing/MHT/Case Management/Adjunct)  Date:  10/26/2017  Time:  9:16 PM  Type of Therapy:  Group Therapy  Participation Level:  Active  Participation Quality:  Appropriate  Affect:  Appropriate  Cognitive:  Appropriate  Insight:  Good  Engagement in Group:  Engaged  Modes of Intervention:  Support  Summary of Progress/Problems:  Natasha [name] 10/26/2017, 9:16 PM

## 2017-10-26 NOTE — H&P (Signed)
Natasha [name] is an 46 y.o. female.   Chief Complaint: No change to physical exam.  Still depressed slightly better no other new complaints HPI: History of recurrent severe depression receiving ECT  Past Medical History:  Diagnosis Date  . C6 spinal cord injury (HCC)   . Depression     Past Surgical History:  Procedure Laterality Date  . BACK SURGERY    . NECK SURGERY      Family History  Problem Relation Age of Onset  . Alcohol abuse Father   . Alcohol abuse Brother   . Depression Mother   . Alcohol abuse Mother    Social History:  reports that she has quit smoking. she has never used smokeless tobacco. She reports that she does not drink alcohol or use drugs.  Allergies: No Known Allergies  Medications Prior to Admission  Medication Sig Dispense Refill  . acetaminophen (TYLENOL) 325 MG tablet Take 2 tablets (650 mg total) every 6 (six) hours as needed by mouth for mild pain.    [name] Kitchen. alum & mag hydroxide-simeth (MAALOX/MYLANTA) 200-200-20 MG/5ML suspension Take 30 mLs every 4 (four) hours as needed by mouth for indigestion. 355 mL 0  . busPIRone (BUSPAR) 10 MG tablet Take 1 tablet (10 mg total) 3 (three) times daily by mouth.    . clonazePAM (KLONOPIN) 0.5 MG tablet Take 1 tablet (0.5 mg total) 2 (two) times daily as needed by mouth (anxiety). 30 tablet 0  . DULoxetine (CYMBALTA) 60 MG capsule Take 1 capsule (60 mg total) daily by mouth.  3  . feeding supplement, ENSURE ENLIVE, (ENSURE ENLIVE) LIQD Take 237 mLs 2 (two) times daily between meals by mouth. 237 mL 12  . magnesium hydroxide (MILK OF MAGNESIA) 400 MG/5ML suspension Take 30 mLs daily as needed by mouth for mild constipation. 360 mL 0  . ondansetron (ZOFRAN) 4 MG tablet Take 1 tablet (4 mg total) every 8 (eight) hours as needed by mouth for nausea or vomiting. 20 tablet 0  . QUEtiapine (SEROQUEL) 25 MG tablet Take 1 tablet (25 mg total) 2 (two) times daily by mouth.    . traZODone (DESYREL) 100 MG tablet Take 1  tablet (100 mg total) at bedtime as needed by mouth for sleep.      Results for orders placed or performed during the hospital encounter of 10/21/17 (from the past 48 hour(s))  Glucose, capillary     Status: None   Collection Time: 10/26/17  7:07 AM  Result Value Ref Range   Glucose-Capillary 92 65 - 99 mg/dL   Comment 1 Notify RN   Pregnancy, urine POC     Status: None   Collection Time: 10/26/17  9:15 AM  Result Value Ref Range   Preg Test, Ur NEGATIVE NEGATIVE    Comment:        THE SENSITIVITY OF THIS METHODOLOGY IS >24 mIU/mL    No results found.  Review of Systems  Constitutional: Negative.   HENT: Negative.   Eyes: Negative.   Respiratory: Negative.   Cardiovascular: Negative.   Gastrointestinal: Negative.   Musculoskeletal: Negative.   Skin: Negative.   Neurological: Negative.   Psychiatric/Behavioral: Positive for depression and suicidal ideas. Negative for hallucinations, memory loss and substance abuse. The patient has insomnia. The patient is not nervous/anxious.     Blood pressure 112/81, pulse 72, temperature 98.1 F (36.7 C), temperature source Oral, resp. rate 16, height 5' (1.524 m), weight 51.7 kg (114 lb), last menstrual period 09/29/2017, SpO2 100 %.  Physical Exam  Nursing note and vitals reviewed. Constitutional: She appears well-developed and well-nourished.  HENT:  Head: Normocephalic and atraumatic.  Eyes: Conjunctivae are normal. Pupils are equal, round, and reactive to light.  Neck: Normal range of motion.  Cardiovascular: Normal heart sounds.  Respiratory: Effort normal.  GI: Soft.  Musculoskeletal: Normal range of motion.  Neurological: She is alert.  Chronic paraplegia as noted previously no change  Skin: Skin is warm and dry.  Psychiatric: Judgment normal. Her affect is blunt. Her speech is delayed. She is slowed. Thought content is not paranoid. Cognition and memory are normal. She expresses suicidal ideation. She expresses no homicidal  ideation.     Assessment/Plan Treatment today continue with 3 times a week treatment with regular reassessment for discharge planning  Mordecai RasmussenJohn Floyd Wade, MD 10/26/2017, 11:22 AM

## 2017-10-26 NOTE — Anesthesia Preprocedure Evaluation (Signed)
Anesthesia Evaluation  Patient identified by MRN, date of birth, ID band Patient awake    Reviewed: Allergy & Precautions, NPO status , Patient's Chart, lab work & pertinent test results  History of Anesthesia Complications Negative for: history of anesthetic complications  Airway Mallampati: II  TM Distance: >3 FB Neck ROM: Full    Dental no notable dental hx.    Pulmonary neg sleep apnea, neg COPD, former smoker,    breath sounds clear to auscultation- rhonchi (-) wheezing      Cardiovascular Exercise Tolerance: Good (-) hypertension(-) CAD, (-) Past MI and (-) Cardiac Stents negative cardio ROS   Rhythm:Regular Rate:Normal - Systolic murmurs and - Diastolic murmurs    Neuro/Psych PSYCHIATRIC DISORDERS Anxiety Depression Hx of spinal cord injury with R sided weakness   Neuromuscular disease    GI/Hepatic negative GI ROS, Neg liver ROS,   Endo/Other  negative endocrine ROSneg diabetes  Renal/GU negative Renal ROS     Musculoskeletal negative musculoskeletal ROS (+)   Abdominal (+) - obese,   Peds negative pediatric ROS (+)  Hematology negative hematology ROS (+)   Anesthesia Other Findings Past Medical History: No date: C6 spinal cord injury (HCC) No date: Depression   Reproductive/Obstetrics                             Anesthesia Physical  Anesthesia Plan  ASA: II  Anesthesia Plan: General   Post-op Pain Management:    Induction: Intravenous  PONV Risk Score and Plan: 2 and Ondansetron  Airway Management Planned: Mask  Additional Equipment:   Intra-op Plan:   Post-operative Plan:   Informed Consent: I have reviewed the patients History and Physical, chart, labs and discussed the procedure including the risks, benefits and alternatives for the proposed anesthesia with the patient or authorized representative who has indicated his/her understanding and acceptance.    Dental advisory given  Plan Discussed with: CRNA and Anesthesiologist  Anesthesia Plan Comments:         Anesthesia Quick Evaluation

## 2017-10-27 ENCOUNTER — Other Ambulatory Visit: Payer: Self-pay | Admitting: Psychiatry

## 2017-10-27 NOTE — [facility] Group Notes (Signed)
[facility] Group Notes:  (Nursing/MHT/Case Management/Adjunct)  Date:  10/27/2017  Time:  9:32 PM  Type of Therapy:  Group Therapy  Participation Level:  Active  Participation Quality:  Appropriate  Affect:  Appropriate  Cognitive:  Appropriate  Insight:  Good  Engagement in Group:  Engaged  Modes of Intervention:  Education and Support  Summary of Progress/Problems:  Natasha [name] 10/27/2017, 9:32 PM

## 2017-10-27 NOTE — Progress Notes (Signed)
D: Patient alert and oriented. Affect/mood: Pleasant, cooperative. Endorses passive SI, denies HI, and AVH at this time. Denies pain. Goal: to "participate". Plans to achieve this goal by attending groups. Per patient self inventory sheet patient reports "I'm doing better". States that she slept "good: with sleep medication, Has had "fair" appetite, "low" energy level, and anxiety "6" (0-10).  A: Scheduled medications administered to patient per MD order. Support and encouragement provided. Routine safety checks conducted every 15 minutes. Patient informed to notify staff with problems or concerns.  R: No adverse drug reactions noted. Patient contracts for safety at this time. Patient compliant with medications and treatment plan. Patient receptive, calm, and cooperative. Pleasant upon interaction. Patient interacts well with others on the unit. Patient remains safe at this time.

## 2017-10-27 NOTE — [facility] Group Notes (Signed)
Goals Group Date/Time: 10/27/2017 9:00 AM Type of Therapy and Topic: Group Therapy: Goals Group: SMART Goals   Participation Level: Moderate  Description of Group:    The purpose of a daily goals group is to assist and guide patients in setting recovery/wellness-related goals. The objective is to set goals as they relate to the crisis in which they were admitted. Patients will be using SMART goal modalities to set measurable goals. Characteristics of realistic goals will be discussed and patients will be assisted in setting and processing how one will reach their goal. Facilitator will also assist patients in applying interventions and coping skills learned in psycho-education groups to the SMART goal and process how one will achieve defined goal.   Therapeutic Goals:   -Patients will develop and document one goal related to or their crisis in which brought them into treatment.  -Patients will be guided by LCSW using SMART goal setting modality in how to set a measurable, attainable, realistic and time sensitive goal.  -Patients will process barriers in reaching goal.  -Patients will process interventions in how to overcome and successful in reaching goal.   Patient's Goal: to get her Thanksgiving arrangements made today.   Therapeutic Modalities:  Motivational Interviewing  Research officer, political partyCognitive Behavioral Therapy  Crisis Intervention Model  SMART goals setting   Natasha SquibbGreg Tawania [name], [place]

## 2017-10-27 NOTE — [facility] Group Notes (Signed)
[facility] LCSW Group Therapy Note  Date/Time: 10/27/17, 0930  Type of Therapy/Topic:  Group Therapy:  Balance in Life  Participation Level: minimal   Description of Group:    This group will address the concept of balance and how it feels and looks when one is unbalanced. Patients will be encouraged to process areas in their lives that are out of balance, and identify reasons for remaining unbalanced. Facilitators will guide patients utilizing problem- solving interventions to address and correct the stressor making their life unbalanced. Understanding and applying boundaries will be explored and addressed for obtaining  and maintaining a balanced life. Patients will be encouraged to explore ways to assertively make their unbalanced needs known to significant others in their lives, using other group members and facilitator for support and feedback.  Therapeutic Goals: 1. Patient will identify two or more emotions or situations they have that consume much of in their lives. 2. Patient will identify signs/triggers that life has become out of balance:  3. Patient will identify two ways to set boundaries in order to achieve balance in their lives:  4. Patient will demonstrate ability to communicate their needs through discussion and/or role plays  Summary of Patient Progress: Pt identified physical, mental/emotional, family, and work as areas of her life that are currently out of balance.  Pt made comments when asked questions by CSW but otherwise remained quiet.          Therapeutic Modalities:   Cognitive Behavioral Therapy Solution-Focused Therapy Assertiveness Training  Daleen SquibbGreg Kamron Vanwyhe, [place]CS[place]

## 2017-10-27 NOTE — Progress Notes (Signed)
[facility]  10/27/2017 6:06 PM Natasha Lubertha SouthKatherine [name]  MRN:  [id number] Subjective: Patient says she is feeling better today.  This has been her best day yet.  Still has occasional suicidal thoughts and overall feels down but is starting to get more optimistic.  She is able to stay involved with good attention in groups and interacting with peers.  She slept better last night.  Stomach acid problem improved Principal Problem: Severe recurrent major depression without psychotic features (HCC) Diagnosis:   Patient Active Problem List   Diagnosis Date Noted  . Severe recurrent major depression without psychotic features (HCC) [F33.2] 10/21/2017  . Paraplegia (HCC) [G82.20] 10/21/2017  . GAD (generalized anxiety disorder) [F41.1] 10/20/2017  . MDD (major depressive disorder), recurrent episode, severe (HCC) [F33.2] 10/18/2017  . Major depressive disorder, recurrent, severe without psychotic features (HCC) [F33.2]    Total Time spent with patient: 20 minutes  Past Psychiatric History: Long history of sustained severe recurrent depression with suicide attempts.  Currently getting ECT for stabilization  Past Medical History:  Past Medical History:  Diagnosis Date  . C6 spinal cord injury (HCC)   . Depression     Past Surgical History:  Procedure Laterality Date  . BACK SURGERY    . NECK SURGERY     Family History:  Family History  Problem Relation Age of Onset  . Alcohol abuse Father   . Alcohol abuse Brother   . Depression Mother   . Alcohol abuse Mother    Family Psychiatric  History: Positive for depression Social History:  Social History   Substance and Sexual Activity  Alcohol Use No  . Alcohol/week: 0.0 oz   Comment: Occasional use     Social History   Substance and Sexual Activity  Drug Use No    Social History   Socioeconomic History  . Marital status: Single    Spouse name: None  . Number of children: None  . Years of education: None  . Highest  education level: None  Social Needs  . Financial resource strain: None  . Food insecurity - worry: None  . Food insecurity - inability: None  . Transportation needs - medical: None  . Transportation needs - non-medical: None  Occupational History  . None  Tobacco Use  . Smoking status: Former Games developermoker  . Smokeless tobacco: Never Used  . Tobacco comment: No smoking hx; no need for cessation materials  Substance and Sexual Activity  . Alcohol use: No    Alcohol/week: 0.0 oz    Comment: Occasional use  . Drug use: No  . Sexual activity: No  Other Topics Concern  . None  Social History Narrative  . None   Additional Social History:                         Sleep: Fair  Appetite:  Good  Current Medications: Current Facility-Administered Medications  Medication Dose Route Frequency Provider Last Rate Last Dose  . acetaminophen (TYLENOL) tablet 650 mg  650 mg Oral Q6H PRN Clapacs, Jackquline DenmarkJohn T, MD   650 mg at 10/24/17 2016  . alum & mag hydroxide-simeth (MAALOX/MYLANTA) 200-200-20 MG/5ML suspension 30 mL  30 mL Oral Q4H PRN Clapacs, John T, MD      . busPIRone (BUSPAR) tablet 10 mg  10 mg Oral TID Clapacs, Jackquline DenmarkJohn T, MD   10 mg at 10/27/17 1634  . clonazePAM (KLONOPIN) tablet 0.5 mg  0.5 mg Oral BID PRN  Clapacs, Jackquline Denmark, MD      . DULoxetine (CYMBALTA) DR capsule 60 mg  60 mg Oral Daily Clapacs, Jackquline Denmark, MD   60 mg at 10/27/17 0757  . famotidine (PEPCID) tablet 20 mg  20 mg Oral BID Clapacs, Jackquline Denmark, MD   20 mg at 10/27/17 1634  . magnesium hydroxide (MILK OF MAGNESIA) suspension 30 mL  30 mL Oral Daily PRN Clapacs, John T, MD      . QUEtiapine (SEROQUEL) tablet 25 mg  25 mg Oral BID Clapacs, Jackquline Denmark, MD   25 mg at 10/27/17 1635  . traZODone (DESYREL) tablet 200 mg  200 mg Oral QHS Clapacs, John T, MD   200 mg at 10/26/17 2301    Lab Results:  Results for orders placed or performed during the hospital encounter of 10/21/17 (from the past 48 hour(s))  Glucose, capillary     Status:  None   Collection Time: 10/26/17  7:07 AM  Result Value Ref Range   Glucose-Capillary 92 65 - 99 mg/dL   Comment 1 Notify RN   Pregnancy, urine POC     Status: None   Collection Time: 10/26/17  9:15 AM  Result Value Ref Range   Preg Test, Ur NEGATIVE NEGATIVE    Comment:        THE SENSITIVITY OF THIS METHODOLOGY IS >24 mIU/mL     Blood Alcohol level:  Lab Results  Component Value Date   ETH <10 10/18/2017   ETH <5 08/15/2017    Metabolic Disorder Labs: Lab Results  Component Value Date   HGBA1C 4.7 (L) 10/23/2017   MPG 88.19 10/23/2017   MPG 88 02/03/2017   Lab Results  Component Value Date   PROLACTIN 121.9 (H) 02/03/2017   Lab Results  Component Value Date   CHOL 181 10/23/2017   TRIG 56 10/23/2017   HDL 46 10/23/2017   CHOLHDL 3.9 10/23/2017   VLDL 11 10/23/2017   LDLCALC 124 (H) 10/23/2017   LDLCALC 149 (H) 02/03/2017    Physical Findings: AIMS:  , ,  ,  ,    CIWA:    COWS:     Musculoskeletal: Strength & Muscle Tone: decreased Gait & Station: unable to stand Patient leans: N/A  Psychiatric Specialty Exam: Physical Exam  Nursing note and vitals reviewed. Constitutional: She appears well-developed and well-nourished.  HENT:  Head: Normocephalic and atraumatic.  Eyes: Conjunctivae are normal. Pupils are equal, round, and reactive to light.  Neck: Normal range of motion.  Cardiovascular: Regular rhythm and normal heart sounds.  Respiratory: Effort normal. No respiratory distress.  GI: Soft.  Musculoskeletal: Normal range of motion.  Neurological: She is alert.  Skin: Skin is warm and dry.  Psychiatric: Judgment normal. Her speech is delayed. She is slowed. Cognition and memory are normal. She exhibits a depressed mood. She expresses suicidal ideation. She expresses no suicidal plans.    Review of Systems  Constitutional: Negative.   HENT: Negative.   Eyes: Negative.   Respiratory: Negative.   Cardiovascular: Negative.   Gastrointestinal:  Negative.   Musculoskeletal: Negative.   Skin: Negative.   Neurological: Negative.   Psychiatric/Behavioral: Positive for depression and suicidal ideas. Negative for hallucinations, memory loss and substance abuse. The patient is not nervous/anxious and does not have insomnia.     Blood pressure (!) 79/44, pulse 65, temperature 98.7 F (37.1 C), temperature source Oral, resp. rate 16, height 5' (1.524 m), weight 51.7 kg (114 lb), last menstrual period 09/29/2017, SpO2  100 %.Body mass index is 22.26 kg/m.  General Appearance: Fairly Groomed  Eye Contact:  Good  Speech:  Clear and Coherent  Volume:  Normal  Mood:  Euthymic  Affect:  Congruent  Thought Process:  Goal Directed  Orientation:  Full (Time, Place, and Person)  Thought Content:  Logical  Suicidal Thoughts:  Yes.  without intent/plan  Homicidal Thoughts:  No  Memory:  Immediate;   Good Recent;   Fair Remote;   Fair  Judgement:  Fair  Insight:  Fair  Psychomotor Activity:  Decreased  Concentration:  Concentration: Good  Recall:  Good  Fund of Knowledge:  Good  Language:  Good  Akathisia:  No  Handed:  Right  AIMS (if indicated):     Assets:  Communication Skills Desire for Improvement Housing Resilience Social Support  ADL's:  Intact  Cognition:  WNL  Sleep:  Number of Hours: 4.3     Treatment Plan Summary: Daily contact with patient to assess and evaluate symptoms and progress in treatment, Medication management and Plan Patient showing improvement still not back to a safe enough baseline for discharge.  Showing good response and good tolerance of ECT.  Next ECT treatment scheduled for tomorrow morning.  No further change in medication indicated for tonight.  Reviewed plan with patient and spoke with nursing as well.  Mordecai RasmussenJohn Clapacs, MD 10/27/2017, 6:06 PM

## 2017-10-27 NOTE — Progress Notes (Signed)
Patient ID: Natasha SouthwardJenifer [name], female   DOB: Feb 07, 1971, 46 y.o.   MRN: [id number] "the best yet.." patient described her day today. Visible in the day room interacting with peers and staffs, pleasant on approach, verbalized understanding of anticipated on coming ECT in am: NPO after MN, Pre-ECT CBG. Denied pain, denied SI.HI/AVH now.

## 2017-10-27 NOTE — Plan of Care (Signed)
Patient slept for Estimated Hours of 4.5; deferred bedtime medications until after 2300 hrs; still reading after MN. Paraplegic, wheel chair, self helped. Precautionary checks every 15 minutes for safety maintained, room free of safety hazards, patient sustains no injury or falls during this shift.

## 2017-10-27 NOTE — Progress Notes (Signed)
Patient ID: Natasha SouthwardJenifer [name], female   DOB: 07/03/1971, 46 y.o.   MRN: [id number] Improving based on self assessment/evaluation "so far so good, I will have another ECT on Friday and then re-evaluate, Dr. Toni Amendlapacs has been good to me, he said he will increase the Trazodone from 100 to 200 mg tonight, so I will be able to sleep better.." Expecting to be discharged back to her mother's house, "my mother is at the nursing home and I live there until we can sell the house.." Pleasant, polite, interacting and "feeling good."

## 2017-10-28 ENCOUNTER — Inpatient Hospital Stay: Payer: Medicare Other

## 2017-10-28 ENCOUNTER — Inpatient Hospital Stay: Payer: Medicare Other | Admitting: Registered Nurse

## 2017-10-28 ENCOUNTER — Encounter: Payer: Self-pay | Admitting: *Deleted

## 2017-10-28 ENCOUNTER — Other Ambulatory Visit: Payer: Self-pay | Admitting: Psychiatry

## 2017-10-28 DIAGNOSIS — F339 Major depressive disorder, recurrent, unspecified: Secondary | ICD-10-CM | POA: Diagnosis not present

## 2017-10-28 DIAGNOSIS — Z87828 Personal history of other (healed) physical injury and trauma: Secondary | ICD-10-CM | POA: Diagnosis not present

## 2017-10-28 DIAGNOSIS — G709 Myoneural disorder, unspecified: Secondary | ICD-10-CM | POA: Diagnosis not present

## 2017-10-28 LAB — GLUCOSE, CAPILLARY: Glucose-Capillary: 103 mg/dL — ABNORMAL HIGH (ref 65–99)

## 2017-10-28 MED ORDER — SUCCINYLCHOLINE CHLORIDE 20 MG/ML IJ SOLN
INTRAMUSCULAR | Status: DC | PRN
  Administered 2017-10-28: 60 mg via INTRAVENOUS

## 2017-10-28 MED ORDER — FENTANYL CITRATE (PF) 100 MCG/2ML IJ SOLN: 25.0000 ug | INTRAMUSCULAR | Status: DC | PRN

## 2017-10-28 MED ORDER — KETOROLAC TROMETHAMINE 30 MG/ML IJ SOLN
INTRAMUSCULAR | Status: AC
  Administered 2017-10-28: 30 mg via INTRAVENOUS
  Filled 2017-10-28: qty 1

## 2017-10-28 MED ORDER — SODIUM CHLORIDE 0.9 % IV SOLN
INTRAVENOUS | Status: DC | PRN
  Administered 2017-10-28: 11:00:00 via INTRAVENOUS

## 2017-10-28 MED ORDER — GLYCOPYRROLATE 0.2 MG/ML IJ SOLN
INTRAMUSCULAR | Status: AC
  Administered 2017-10-28: 0.1 mg via INTRAVENOUS
  Filled 2017-10-28: qty 1

## 2017-10-28 MED ORDER — SODIUM CHLORIDE 0.9 % IV SOLN
500.0000 mL | Freq: Once | INTRAVENOUS | Status: AC
  Administered 2017-10-28: 500 mL via INTRAVENOUS

## 2017-10-28 MED ORDER — ONDANSETRON HCL 4 MG/2ML IJ SOLN
INTRAMUSCULAR | Status: AC
  Filled 2017-10-28: qty 2

## 2017-10-28 MED ORDER — GLYCOPYRROLATE 0.2 MG/ML IJ SOLN
0.1000 mg | Freq: Once | INTRAMUSCULAR | Status: AC
  Administered 2017-10-28: 0.1 mg via INTRAVENOUS

## 2017-10-28 MED ORDER — GLYCOPYRROLATE 0.2 MG/ML IJ SOLN
INTRAMUSCULAR | Status: AC
  Filled 2017-10-28: qty 1

## 2017-10-28 MED ORDER — KETOROLAC TROMETHAMINE 30 MG/ML IJ SOLN
30.0000 mg | Freq: Once | INTRAMUSCULAR | Status: AC
  Administered 2017-10-28: 30 mg via INTRAVENOUS

## 2017-10-28 MED ORDER — METHOHEXITAL SODIUM 100 MG/10ML IV SOSY
PREFILLED_SYRINGE | INTRAVENOUS | Status: DC | PRN
Start: 2017-10-28 — End: 2017-10-28
  Administered 2017-10-28: 50 mg via INTRAVENOUS

## 2017-10-28 MED ORDER — ONDANSETRON HCL 4 MG/2ML IJ SOLN
4.0000 mg | Freq: Once | INTRAMUSCULAR | Status: AC | PRN
  Administered 2017-10-28: 4 mg via INTRAVENOUS

## 2017-10-28 NOTE — Tx Team (Signed)
Interdisciplinary Treatment and Diagnostic Plan Update  10/28/2017 Time of Session: 1415 Natasha Lubertha SouthKatherine [name] MRN: [id number]  Principal Diagnosis: Severe recurrent major depression without psychotic features [facility](HCC)  Secondary Diagnoses: Principal Problem:   Severe recurrent major depression without psychotic features (HCC) Active Problems:   Paraplegia (HCC)   Current Medications:  Current Facility-Administered Medications  Medication Dose Route Frequency Provider Last Rate Last Dose  . acetaminophen (TYLENOL) tablet 650 mg  650 mg Oral Q6H PRN Clapacs, Jackquline DenmarkJohn T, MD   650 mg at 10/24/17 2016  . alum & mag hydroxide-simeth (MAALOX/MYLANTA) 200-200-20 MG/5ML suspension 30 mL  30 mL Oral Q4H PRN Clapacs, John T, MD      . busPIRone (BUSPAR) tablet 10 mg  10 mg Oral TID Clapacs, Jackquline DenmarkJohn T, MD   10 mg at 10/28/17 1242  . clonazePAM (KLONOPIN) tablet 0.5 mg  0.5 mg Oral BID PRN Clapacs, John T, MD      . DULoxetine (CYMBALTA) DR capsule 60 mg  60 mg Oral Daily Clapacs, Jackquline DenmarkJohn T, MD   60 mg at 10/28/17 1242  . famotidine (PEPCID) tablet 20 mg  20 mg Oral BID Clapacs, Jackquline DenmarkJohn T, MD   20 mg at 10/28/17 1244  . fentaNYL (SUBLIMAZE) injection 25 mcg  25 mcg Intravenous Q5 min PRN Yves Dillarroll, Paul, MD      . magnesium hydroxide (MILK OF MAGNESIA) suspension 30 mL  30 mL Oral Daily PRN Clapacs, John T, MD      . QUEtiapine (SEROQUEL) tablet 25 mg  25 mg Oral BID Clapacs, Jackquline DenmarkJohn T, MD   25 mg at 10/28/17 1242  . traZODone (DESYREL) tablet 200 mg  200 mg Oral QHS Clapacs, John T, MD   200 mg at 10/27/17 2302   PTA Medications: Medications Prior to Admission  Medication Sig Dispense Refill Last Dose  . acetaminophen (TYLENOL) 325 MG tablet Take 2 tablets (650 mg total) every 6 (six) hours as needed by mouth for mild pain.   10/23/2017  . alum & mag hydroxide-simeth (MAALOX/MYLANTA) 200-200-20 MG/5ML suspension Take 30 mLs every 4 (four) hours as needed by mouth for indigestion. 355 mL 0 10/23/2017  . busPIRone  (BUSPAR) 10 MG tablet Take 1 tablet (10 mg total) 3 (three) times daily by mouth.   10/23/2017  . clonazePAM (KLONOPIN) 0.5 MG tablet Take 1 tablet (0.5 mg total) 2 (two) times daily as needed by mouth (anxiety). 30 tablet 0 10/23/2017  . DULoxetine (CYMBALTA) 60 MG capsule Take 1 capsule (60 mg total) daily by mouth.  3 10/23/2017  . feeding supplement, ENSURE ENLIVE, (ENSURE ENLIVE) LIQD Take 237 mLs 2 (two) times daily between meals by mouth. 237 mL 12 10/23/2017  . magnesium hydroxide (MILK OF MAGNESIA) 400 MG/5ML suspension Take 30 mLs daily as needed by mouth for mild constipation. 360 mL 0 10/23/2017  . ondansetron (ZOFRAN) 4 MG tablet Take 1 tablet (4 mg total) every 8 (eight) hours as needed by mouth for nausea or vomiting. 20 tablet 0 10/23/2017  . QUEtiapine (SEROQUEL) 25 MG tablet Take 1 tablet (25 mg total) 2 (two) times daily by mouth.   10/23/2017  . traZODone (DESYREL) 100 MG tablet Take 1 tablet (100 mg total) at bedtime as needed by mouth for sleep.   10/23/2017    Patient Stressors: Loss of mother to an assistant facility Other: mobility resulting in feeling alone and unworthy  Patient Strengths: Ability for insight Capable of independent living Communication skills  Treatment Modalities: Medication Management, Group therapy, Case  management,  1 to 1 session with clinician, Psychoeducation, Recreational therapy.   Physician Treatment Plan for Primary Diagnosis: Severe recurrent major depression without psychotic features (HCC) Long Term Goal(s): Improvement in symptoms so as ready for discharge Improvement in symptoms so as ready for discharge   Short Term Goals: Ability to verbalize feelings will improve Ability to disclose and discuss suicidal ideas Ability to maintain clinical measurements within normal limits will improve Compliance with prescribed medications will improve  Medication Management: Evaluate patient's response, side effects, and tolerance of  medication regimen.  Therapeutic Interventions: 1 to 1 sessions, Unit Group sessions and Medication administration.  Evaluation of Outcomes: Progressing  Physician Treatment Plan for Secondary Diagnosis: Principal Problem:   Severe recurrent major depression without psychotic features (HCC) Active Problems:   Paraplegia (HCC)  Long Term Goal(s): Improvement in symptoms so as ready for discharge Improvement in symptoms so as ready for discharge   Short Term Goals: Ability to verbalize feelings will improve Ability to disclose and discuss suicidal ideas Ability to maintain clinical measurements within normal limits will improve Compliance with prescribed medications will improve     Medication Management: Evaluate patient's response, side effects, and tolerance of medication regimen.  Therapeutic Interventions: 1 to 1 sessions, Unit Group sessions and Medication administration.  Evaluation of Outcomes: Progressing   RN Treatment Plan for Primary Diagnosis: Severe recurrent major depression without psychotic features (HCC) Long Term Goal(s): Knowledge of disease and therapeutic regimen to maintain health will improve  Short Term Goals: Ability to identify and develop effective coping behaviors will improve and Compliance with prescribed medications will improve  Medication Management: RN will administer medications as ordered by provider, will assess and evaluate patient's response and provide education to patient for prescribed medication. RN will report any adverse and/or side effects to prescribing provider.  Therapeutic Interventions: 1 on 1 counseling sessions, Psychoeducation, Medication administration, Evaluate responses to treatment, Monitor vital signs and CBGs as ordered, Perform/monitor CIWA, COWS, AIMS and Fall Risk screenings as ordered, Perform wound care treatments as ordered.  Evaluation of Outcomes: Progressing   LCSW Treatment Plan for Primary Diagnosis: Severe  recurrent major depression without psychotic features (HCC) Long Term Goal(s): Safe transition to appropriate next level of care at discharge, Engage patient in therapeutic group addressing interpersonal concerns.  Short Term Goals: Engage patient in aftercare planning with referrals and resources and Increase skills for wellness and recovery  Therapeutic Interventions: Assess for all discharge needs, 1 to 1 time with Social worker, Explore available resources and support systems, Assess for adequacy in community support network, Educate family and significant other(s) on suicide prevention, Complete Psychosocial Assessment, Interpersonal group therapy.  Evaluation of Outcomes: Progressing   Progress in Treatment: Attending groups: Yes. Participating in groups: Yes. Taking medication as prescribed: Yes. Toleration medication: Yes. Family/Significant other contact made: Yes, individual(s) contacted:  friend Patient understands diagnosis: Yes. Discussing patient identified problems/goals with staff: Yes. Medical problems stabilized or resolved: Yes. Denies suicidal/homicidal ideation: Yes. Issues/concerns per patient self-inventory: No. Other: none  New problem(s) identified: No, Describe:  none  New Short Term/Long Term Goal(s):Pt goal: feel better, less depression, be able to live independently.  Discharge Plan or Barriers: [facility] [place].  Reason for Continuation of Hospitalization: Depression Medication stabilization Other; describe ECT  Estimated Length of Stay: 2-4 days.  Attendees: Patient: 10/28/2017 2:16 PM  Physician: Dr. Toni Amendlapacs, MD 10/28/2017 2:16 PM  Nursing:  10/28/2017  2:16 PM  RN Care Manager: 10/28/2017 2:16 PM  Social Worker: Daleen Squibb, LCSW 10/28/2017 2:16 PM  Recreational Therapist:  10/28/2017 2:16 PM  Other:  10/28/2017 2:16 PM  Other:  10/28/2017 2:16 PM  Other:  10/28/2017 2:16 PM    Scribe for Treatment Team: Lorri Frederick, LCSW 10/28/2017 2:16 PM

## 2017-10-28 NOTE — Anesthesia Procedure Notes (Signed)
Date/Time: 10/28/2017 10:59 AM Performed by: Stormy Fabianurtis, Tonnie Stillman, CRNA Pre-anesthesia Checklist: Patient identified, Emergency Drugs available, Suction available and Patient being monitored Patient Re-evaluated:Patient Re-evaluated prior to induction Oxygen Delivery Method: Circle system utilized Preoxygenation: Pre-oxygenation with 100% oxygen Induction Type: IV induction Ventilation: Mask ventilation without difficulty and Mask ventilation throughout procedure Airway Equipment and Method: Bite block Placement Confirmation: positive ETCO2 Dental Injury: Teeth and Oropharynx as per pre-operative assessment

## 2017-10-28 NOTE — Plan of Care (Signed)
Patient tolerated ECT.Patient verbalized her depression 5/10 but the ECT helping with her depression.Patient verbalized passive suicidal thoughts at times.Appetite and energy level good.Compliant with medications.Did not attend groups.Support and encouragement given.

## 2017-10-28 NOTE — Anesthesia Post-op Follow-up Note (Signed)
Anesthesia QCDR form completed.        

## 2017-10-28 NOTE — Procedures (Signed)
ECT SERVICES Physician's Interval Evaluation & Treatment Note  Patient Identification: Natasha SouthwardJenifer [name] MRN:  [id number] Date of Evaluation:  10/28/2017 TX #: 3  MADRS:   MMSE:   P.E. Findings:  No change to physical exam  Psychiatric Interval Note:  Lucid minimal cognitive side effects.  Mood improving.  Subjective:  Patient is a 46 y.o. female seen for evaluation for Electroconvulsive Therapy. Still some suicidal thought but mood improving patient optimistic  Treatment Summary:   [x]   Right Unilateral             []  Bilateral   % Energy : 0.3 ms 30%   Impedance: 1380 ohms  Seizure Energy Index: 4497 V squared  Postictal Suppression Index: 65%  Seizure Concordance Index: 97%  Medications  Pre Shock: Robinul 0.1 mg Toradol 30 mg Brevital 50 mg succinylcholine 60 mg  Post Shock:    Seizure Duration: 42 seconds by EMG 63 seconds by EEG   Comments: Patient is clearly feeling better but also does not feel completely safe with discharge she had still having some residual passive suicidal ideation.  Based on her very clear knowledge of her illness we agreed that it would be better that she stay in the hospital and she is on the schedule for Monday treatment as well.  Lungs:  []   Clear to auscultation               []  Other:   Heart:    []   Regular rhythm             []  irregular rhythm    []   Previous H&P reviewed, patient examined and there are NO CHANGES                 []   Previous H&P reviewed, patient examined and there are changes noted.   Natasha RasmussenJohn Lillar Bianca, MD 11/16/201810:58 AM

## 2017-10-28 NOTE — Progress Notes (Signed)
[facility]  10/28/2017 5:55 PM Natasha [name]  MRN:  [id number] Subjective: Patient has no new complaints.  Reports that her mood is gradually feeling better although she also says that she does not yet feel safe with going back home by herself.  Patient has had 3 ECT treatments here at our hospital this week all of which have been well tolerated.  She reports that every day her mood is a little better but continues to say that she has serious concerns about suicidality if she were to go back home.  Physically she has no new complaints.  She gets along well on the unit.  Eating well. Principal Problem: Severe recurrent major depression without psychotic features (HCC) Diagnosis:   Patient Active Problem List   Diagnosis Date Noted  . Severe recurrent major depression without psychotic features (HCC) [F33.2] 10/21/2017  . Paraplegia (HCC) [G82.20] 10/21/2017  . GAD (generalized anxiety disorder) [F41.1] 10/20/2017  . MDD (major depressive disorder), recurrent episode, severe (HCC) [F33.2] 10/18/2017  . Major depressive disorder, recurrent, severe without psychotic features (HCC) [F33.2]    Total Time spent with patient: 30 minutes  Past Psychiatric History: Patient has a long-standing history of severe recurrent major depression with a history of more than one serious past suicide attempt  Past Medical History:  Past Medical History:  Diagnosis Date  . C6 spinal cord injury (HCC)   . Depression     Past Surgical History:  Procedure Laterality Date  . BACK SURGERY    . NECK SURGERY     Family History:  Family History  Problem Relation Age of Onset  . Alcohol abuse Father   . Alcohol abuse Brother   . Depression Mother   . Alcohol abuse Mother    Family Psychiatric  History: Unknown Social History:  Social History   Substance and Sexual Activity  Alcohol Use No  . Alcohol/week: 0.0 oz   Comment: Occasional use     Social History   Substance and Sexual  Activity  Drug Use No    Social History   Socioeconomic History  . Marital status: Single    Spouse name: None  . Number of children: None  . Years of education: None  . Highest education level: None  Social Needs  . Financial resource strain: None  . Food insecurity - worry: None  . Food insecurity - inability: None  . Transportation needs - medical: None  . Transportation needs - non-medical: None  Occupational History  . None  Tobacco Use  . Smoking status: Former Games developermoker  . Smokeless tobacco: Never Used  . Tobacco comment: No smoking hx; no need for cessation materials  Substance and Sexual Activity  . Alcohol use: No    Alcohol/week: 0.0 oz    Comment: Occasional use  . Drug use: No  . Sexual activity: No  Other Topics Concern  . None  Social History Narrative  . None   Additional Social History:                         Sleep: Fair  Appetite:  Fair  Current Medications: Current Facility-Administered Medications  Medication Dose Route Frequency Provider Last Rate Last Dose  . acetaminophen (TYLENOL) tablet 650 mg  650 mg Oral Q6H PRN Quantina Dershem, Jackquline DenmarkJohn T, MD   650 mg at 10/24/17 2016  . alum & mag hydroxide-simeth (MAALOX/MYLANTA) 200-200-20 MG/5ML suspension 30 mL  30 mL Oral Q4H PRN Blonnie Maske,  Jackquline Denmark, MD      . busPIRone (BUSPAR) tablet 10 mg  10 mg Oral TID Philicia Heyne, Jackquline Denmark, MD   10 mg at 10/28/17 1658  . clonazePAM (KLONOPIN) tablet 0.5 mg  0.5 mg Oral BID PRN Ab Leaming T, MD      . DULoxetine (CYMBALTA) DR capsule 60 mg  60 mg Oral Daily Kennedee Kitzmiller, Jackquline Denmark, MD   60 mg at 10/28/17 1242  . famotidine (PEPCID) tablet 20 mg  20 mg Oral BID Kala Gassmann, Jackquline Denmark, MD   20 mg at 10/28/17 1657  . fentaNYL (SUBLIMAZE) injection 25 mcg  25 mcg Intravenous Q5 min PRN Yves Dill, MD      . magnesium hydroxide (MILK OF MAGNESIA) suspension 30 mL  30 mL Oral Daily PRN Tarini Carrier T, MD      . QUEtiapine (SEROQUEL) tablet 25 mg  25 mg Oral BID Marisela Line, Jackquline Denmark, MD   25  mg at 10/28/17 1657  . traZODone (DESYREL) tablet 200 mg  200 mg Oral QHS Samuel Mcpeek T, MD   200 mg at 10/27/17 2302    Lab Results:  Results for orders placed or performed during the hospital encounter of 10/21/17 (from the past 48 hour(s))  Glucose, capillary     Status: Abnormal   Collection Time: 10/28/17  7:05 AM  Result Value Ref Range   Glucose-Capillary 103 (H) 65 - 99 mg/dL   Comment 1 Notify RN     Blood Alcohol level:  Lab Results  Component Value Date   ETH <10 10/18/2017   ETH <5 08/15/2017    Metabolic Disorder Labs: Lab Results  Component Value Date   HGBA1C 4.7 (L) 10/23/2017   MPG 88.19 10/23/2017   MPG 88 02/03/2017   Lab Results  Component Value Date   PROLACTIN 121.9 (H) 02/03/2017   Lab Results  Component Value Date   CHOL 181 10/23/2017   TRIG 56 10/23/2017   HDL 46 10/23/2017   CHOLHDL 3.9 10/23/2017   VLDL 11 10/23/2017   LDLCALC 124 (H) 10/23/2017   LDLCALC 149 (H) 02/03/2017    Physical Findings: AIMS:  , ,  ,  ,    CIWA:    COWS:     Musculoskeletal: Strength & Muscle Tone: decreased Gait & Station: unsteady Patient leans: N/A  Psychiatric Specialty Exam: Physical Exam  Nursing note and vitals reviewed. Constitutional: She appears well-developed and well-nourished.  HENT:  Head: Normocephalic and atraumatic.  Eyes: Conjunctivae are normal. Pupils are equal, round, and reactive to light.  Neck: Normal range of motion.  Cardiovascular: Regular rhythm and normal heart sounds.  Respiratory: Effort normal. No respiratory distress.  GI: Soft.  Musculoskeletal: Normal range of motion.  Neurological: She is alert.  Skin: Skin is warm and dry.  Psychiatric: She has a normal mood and affect. Her speech is normal and behavior is normal. Judgment and thought content normal. Cognition and memory are normal.    Review of Systems  Constitutional: Negative.   HENT: Negative.   Eyes: Negative.   Respiratory: Negative.    Cardiovascular: Negative.   Gastrointestinal: Negative.   Musculoskeletal: Negative.   Skin: Negative.   Neurological: Negative.   Psychiatric/Behavioral: Positive for depression and suicidal ideas. Negative for hallucinations, memory loss and substance abuse. The patient has insomnia. The patient is not nervous/anxious.     Blood pressure (!) 99/6, pulse 68, temperature 98.7 F (37.1 C), temperature source Oral, resp. rate 18, height 5' (1.524 m), weight  51.7 kg (114 lb), last menstrual period 10/24/2017, SpO2 99 %.Body mass index is 22.26 kg/m.  General Appearance: Casual  Eye Contact:  Good  Speech:  Normal Rate  Volume:  Decreased  Mood:  Depressed  Affect:  Congruent  Thought Process:  Goal Directed  Orientation:  Full (Time, Place, and Person)  Thought Content:  Logical  Suicidal Thoughts:  Yes.  without intent/plan  Homicidal Thoughts:  No  Memory:  Immediate;   Good Recent;   Fair Remote;   Fair  Judgement:  Fair  Insight:  Fair  Psychomotor Activity:  Normal  Concentration:  Concentration: Fair  Recall:  [employer]Fair  Fund of Knowledge:  Fair  Language:  Fair  Akathisia:  No  Handed:  Right  AIMS (if indicated):     Assets:  Desire for Improvement Housing Resilience Social Support  ADL's:  Intact  Cognition:  WNL  Sleep:  Number of Hours: 6     Treatment Plan Summary: Medication management and Plan Patient is showing gradual improvement to ECT treatment which she is tolerating quite well.  We are working with her on a day by day basis to determine a safe discharge date.  Patient today said that she is still concerned about the return of serious suicidal thoughts should she return home.  Given her past history we certainly would defer to her judgment about this.  After ECT today patient completed going to groups and participating well in activities today.  She will continue current treatment plan through the weekend.  Next ECT treatment scheduled for Monday.  Mordecai RasmussenJohn  Joelyn Lover, MD 10/28/2017, 5:55 PM

## 2017-10-28 NOTE — Progress Notes (Signed)
States she is feeling better. Rates depression and anxiety 6/10, denies SI or AVH. Is visible in the day room. Had snack. Is reading a book. No physical clo. Remains on routine obs for safety.

## 2017-10-28 NOTE — H&P (Signed)
Natasha [name] is an 46 y.o. female.   Chief Complaint: Major depression severe recurrent.  Tolerating treatment well.  Depression gradually improving. HPI: History of severe recurrent depression with good previous response to ECT  Past Medical History:  Diagnosis Date  . C6 spinal cord injury (HCC)   . Depression     Past Surgical History:  Procedure Laterality Date  . BACK SURGERY    . NECK SURGERY      Family History  Problem Relation Age of Onset  . Alcohol abuse Father   . Alcohol abuse Brother   . Depression Mother   . Alcohol abuse Mother    Social History:  reports that she has quit smoking. she has never used smokeless tobacco. She reports that she does not drink alcohol or use drugs.  Allergies: No Known Allergies  Medications Prior to Admission  Medication Sig Dispense Refill  . acetaminophen (TYLENOL) 325 MG tablet Take 2 tablets (650 mg total) every 6 (six) hours as needed by mouth for mild pain.    [name] Kitchen. alum & mag hydroxide-simeth (MAALOX/MYLANTA) 200-200-20 MG/5ML suspension Take 30 mLs every 4 (four) hours as needed by mouth for indigestion. 355 mL 0  . busPIRone (BUSPAR) 10 MG tablet Take 1 tablet (10 mg total) 3 (three) times daily by mouth.    . clonazePAM (KLONOPIN) 0.5 MG tablet Take 1 tablet (0.5 mg total) 2 (two) times daily as needed by mouth (anxiety). 30 tablet 0  . DULoxetine (CYMBALTA) 60 MG capsule Take 1 capsule (60 mg total) daily by mouth.  3  . feeding supplement, ENSURE ENLIVE, (ENSURE ENLIVE) LIQD Take 237 mLs 2 (two) times daily between meals by mouth. 237 mL 12  . magnesium hydroxide (MILK OF MAGNESIA) 400 MG/5ML suspension Take 30 mLs daily as needed by mouth for mild constipation. 360 mL 0  . ondansetron (ZOFRAN) 4 MG tablet Take 1 tablet (4 mg total) every 8 (eight) hours as needed by mouth for nausea or vomiting. 20 tablet 0  . QUEtiapine (SEROQUEL) 25 MG tablet Take 1 tablet (25 mg total) 2 (two) times daily by mouth.    .  traZODone (DESYREL) 100 MG tablet Take 1 tablet (100 mg total) at bedtime as needed by mouth for sleep.      Results for orders placed or performed during the hospital encounter of 10/21/17 (from the past 48 hour(s))  Glucose, capillary     Status: Abnormal   Collection Time: 10/28/17  7:05 AM  Result Value Ref Range   Glucose-Capillary 103 (H) 65 - 99 mg/dL   Comment 1 Notify RN    No results found.  Review of Systems  Constitutional: Negative.   HENT: Negative.   Eyes: Negative.   Respiratory: Negative.   Cardiovascular: Negative.   Gastrointestinal: Negative.   Musculoskeletal: Negative.   Skin: Negative.   Neurological: Negative.   Psychiatric/Behavioral: Positive for depression and suicidal ideas. Negative for hallucinations, memory loss and substance abuse. The patient is not nervous/anxious and does not have insomnia.     Blood pressure 114/72, pulse 74, temperature 98.2 F (36.8 C), temperature source Oral, resp. rate 18, height 5' (1.524 m), weight 51.7 kg (114 lb), last menstrual period 10/24/2017, SpO2 100 %. Physical Exam  Nursing note and vitals reviewed. Constitutional: She appears well-developed and well-nourished.  HENT:  Head: Normocephalic and atraumatic.  Eyes: Conjunctivae are normal. Pupils are equal, round, and reactive to light.  Neck: Normal range of motion.  Cardiovascular: Regular rhythm and  normal heart sounds.  Respiratory: Effort normal.  GI: Soft.  Musculoskeletal: Normal range of motion.  Neurological: She is alert.  Skin: Skin is warm and dry.  Psychiatric: Judgment normal. Her speech is delayed. She is slowed. Cognition and memory are normal. She exhibits a depressed mood. She expresses suicidal ideation. She expresses no suicidal plans.     Assessment/Plan Patient is showing gradual improvement but still does not feel comfortable enough to make discharge safe.  Continue ECT today and plan on likely seeing her on Monday as well  Mordecai RasmussenJohn  Takyah Ciaramitaro, MD 10/28/2017, 10:57 AM

## 2017-10-28 NOTE — [facility] Group Notes (Signed)
[facility] LCSW Group Therapy Note  Date/Time: 10/28/17, 1300  Type of Therapy and Topic:  Group Therapy:  Feelings around Relapse and Recovery  Participation Level:  Active   Mood:pleasant  Description of Group:    Patients in this group will discuss emotions they experience before and after a relapse. They will process how experiencing these feelings, or avoidance of experiencing them, relates to having a relapse. Facilitator will guide patients to explore emotions they have related to recovery. Patients will be encouraged to process which emotions are more powerful. They will be guided to discuss the emotional reaction significant others in their lives may have to patients' relapse or recovery. Patients will be assisted in exploring ways to respond to the emotions of others without this contributing to a relapse.  Therapeutic Goals: 1. Patient will identify two or more emotions that lead to relapse for them:  2. Patient will identify two emotions that result when they relapse:  3. Patient will identify two emotions related to recovery:  4. Patient will demonstrate ability to communicate their needs through discussion and/or role plays.   Summary of Patient Progress: Pt did not share any substance use history, did participate in group discussion about more positive ways to deal with negative emotions.     Therapeutic Modalities:   Cognitive Behavioral Therapy Solution-Focused Therapy Assertiveness Training Relapse Prevention Therapy  Daleen SquibbGreg Kindel Rochefort, LCSW

## 2017-10-28 NOTE — Anesthesia Preprocedure Evaluation (Signed)
Anesthesia Evaluation  Patient identified by MRN, date of birth, ID band Patient awake    Reviewed: Allergy & Precautions, NPO status , Patient's Chart, lab work & pertinent test results  History of Anesthesia Complications Negative for: history of anesthetic complications  Airway Mallampati: II  TM Distance: >3 FB Neck ROM: Full    Dental no notable dental hx.    Pulmonary neg sleep apnea, neg COPD, former smoker,    breath sounds clear to auscultation- rhonchi (-) wheezing      Cardiovascular Exercise Tolerance: Good (-) hypertension(-) CAD, (-) Past MI and (-) Cardiac Stents negative cardio ROS   Rhythm:Regular Rate:Normal - Systolic murmurs and - Diastolic murmurs    Neuro/Psych PSYCHIATRIC DISORDERS Hx of spinal cord injury with R sided weakness   Neuromuscular disease    GI/Hepatic negative GI ROS, Neg liver ROS,   Endo/Other  negative endocrine ROSneg diabetes  Renal/GU negative Renal ROS     Musculoskeletal negative musculoskeletal ROS (+)   Abdominal (+) - obese,   Peds negative pediatric ROS (+)  Hematology negative hematology ROS (+)   Anesthesia Other Findings Past Medical History: No date: C6 spinal cord injury (HCC) No date: Depression   Reproductive/Obstetrics                             Anesthesia Physical  Anesthesia Plan  ASA: II  Anesthesia Plan: General   Post-op Pain Management:    Induction: Intravenous  PONV Risk Score and Plan: 2 and Ondansetron  Airway Management Planned: Mask  Additional Equipment:   Intra-op Plan:   Post-operative Plan:   Informed Consent: I have reviewed the patients History and Physical, chart, labs and discussed the procedure including the risks, benefits and alternatives for the proposed anesthesia with the patient or authorized representative who has indicated his/her understanding and acceptance.   Dental advisory  given  Plan Discussed with: CRNA and Anesthesiologist  Anesthesia Plan Comments:         Anesthesia Quick Evaluation

## 2017-10-28 NOTE — Plan of Care (Signed)
ECT Prepared; Patient slept for Estimated Hours of 6.0; Precautionary checks every 15 minutes for safety maintained, room free of safety hazards, patient sustains no injury or falls during this shift.

## 2017-10-28 NOTE — Transfer of Care (Signed)
Immediate Anesthesia Transfer of Care Note  Patient: Natasha [name]  Procedure(s) Performed: * No procedures listed *  Patient Location: PACU  Anesthesia Type:General  Level of Consciousness: sedated  Airway & Oxygen Therapy: Patient Spontanous Breathing and Patient connected to face mask oxygen  Post-op Assessment: Report given to RN and Post -op Vital signs reviewed and stable  Post vital signs: Reviewed and stable  Last Vitals:  Vitals:   10/28/17 0930 10/28/17 1114  BP: 114/72 127/83  Pulse: 74   Resp: 18 14  Temp: 36.8 C 37 C  SpO2: 100% 100%    Complications: No apparent anesthesia complications

## 2017-10-29 MED ORDER — TRAZODONE HCL 100 MG PO TABS
300.0000 mg | ORAL_TABLET | Freq: Every day | ORAL | Status: DC
  Administered 2017-10-29: 300 mg via ORAL
  Filled 2017-10-29: qty 3

## 2017-10-29 NOTE — Progress Notes (Addendum)
Up in the dayroom all shift. Reading a boob, some interaction with peers. Endorses anxiety and depression and rates each 6/10. Denies SI/HI or AVH. Was med compliant. No physical complaints.Remains on routine obs for safety. 0630 States she feels sedated this am and thinks trazodone 300mg  is too much.

## 2017-10-29 NOTE — Progress Notes (Signed)
No note

## 2017-10-29 NOTE — Progress Notes (Signed)
[facility] MD Progress Note  10/29/2017 11:44 AM Natasha [name]  MRN:  [id number] Subjective:  Patient has no new complaints. states she is feeling better although still endorses depression andintermittent SI, but no intent, plan.  Patient has had 3 ECT treatments here at our hospital this week all of which have been well tolerated, next on Mon.  Physically she has no new complaints.  She gets along well on the unit.  Eating well. Slept 5 hrs.  Principal Problem: Severe recurrent major depression without psychotic features (HCC) Diagnosis:   Patient Active Problem List   Diagnosis Date Noted  . Severe recurrent major depression without psychotic features (HCC) [F33.2] 10/21/2017  . Paraplegia (HCC) [G82.20] 10/21/2017  . GAD (generalized anxiety disorder) [F41.1] 10/20/2017  . MDD (major depressive disorder), recurrent episode, severe (HCC) [F33.2] 10/18/2017  . Major depressive disorder, recurrent, severe without psychotic features (HCC) [F33.2]    Total Time spent with patient: 30 minutes  Past Psychiatric History: Patient has a long-standing history of severe recurrent major depression with a history of more than one serious past suicide attempt  Past Medical History:  Past Medical History:  Diagnosis Date  . C6 spinal cord injury (HCC)   . Depression     Past Surgical History:  Procedure Laterality Date  . BACK SURGERY    . NECK SURGERY     Family History:  Family History  Problem Relation Age of Onset  . Alcohol abuse Father   . Alcohol abuse Brother   . Depression Mother   . Alcohol abuse Mother    Family Psychiatric  History: Unknown Social History:  Social History   Substance and Sexual Activity  Alcohol Use No  . Alcohol/week: 0.0 oz   Comment: Occasional use     Social History   Substance and Sexual Activity  Drug Use No    Social History   Socioeconomic History  . Marital status: Single    Spouse name: None  . Number of children: None  . Years  of education: None  . Highest education level: None  Social Needs  . Financial resource strain: None  . Food insecurity - worry: None  . Food insecurity - inability: None  . Transportation needs - medical: None  . Transportation needs - non-medical: None  Occupational History  . None  Tobacco Use  . Smoking status: Former Games developer  . Smokeless tobacco: Never Used  . Tobacco comment: No smoking hx; no need for cessation materials  Substance and Sexual Activity  . Alcohol use: No    Alcohol/week: 0.0 oz    Comment: Occasional use  . Drug use: No  . Sexual activity: No  Other Topics Concern  . None  Social History Narrative  . None   Additional Social History:                         Sleep: Fair  Appetite:  Fair  Current Medications: Current Facility-Administered Medications  Medication Dose Route Frequency Provider Last Rate Last Dose  . acetaminophen (TYLENOL) tablet 650 mg  650 mg Oral Q6H PRN Clapacs, Jackquline Denmark, MD   650 mg at 10/28/17 2153  . alum & mag hydroxide-simeth (MAALOX/MYLANTA) 200-200-20 MG/5ML suspension 30 mL  30 mL Oral Q4H PRN Clapacs, John T, MD      . busPIRone (BUSPAR) tablet 10 mg  10 mg Oral TID Clapacs, Jackquline Denmark, MD   10 mg at 10/29/17 0816  . clonazePAM (  KLONOPIN) tablet 0.5 mg  0.5 mg Oral BID PRN Clapacs, John T, MD      . DULoxetine (CYMBALTA) DR capsule 60 mg  60 mg Oral Daily Clapacs, Jackquline DenmarkJohn T, MD   60 mg at 10/29/17 0816  . famotidine (PEPCID) tablet 20 mg  20 mg Oral BID Clapacs, Jackquline DenmarkJohn T, MD   20 mg at 10/29/17 0816  . fentaNYL (SUBLIMAZE) injection 25 mcg  25 mcg Intravenous Q5 min PRN Yves Dillarroll, Paul, MD      . magnesium hydroxide (MILK OF MAGNESIA) suspension 30 mL  30 mL Oral Daily PRN Clapacs, John T, MD      . QUEtiapine (SEROQUEL) tablet 25 mg  25 mg Oral BID Clapacs, Jackquline DenmarkJohn T, MD   25 mg at 10/29/17 0816  . traZODone (DESYREL) tablet 300 mg  300 mg Oral QHS Beverly SessionsSubedi, Jenniferann Stuckert, MD        Lab Results:  Results for orders placed or  performed during the hospital encounter of 10/21/17 (from the past 48 hour(s))  Glucose, capillary     Status: Abnormal   Collection Time: 10/28/17  7:05 AM  Result Value Ref Range   Glucose-Capillary 103 (H) 65 - 99 mg/dL   Comment 1 Notify RN     Blood Alcohol level:  Lab Results  Component Value Date   ETH <10 10/18/2017   ETH <5 08/15/2017    Metabolic Disorder Labs: Lab Results  Component Value Date   HGBA1C 4.7 (L) 10/23/2017   MPG 88.19 10/23/2017   MPG 88 02/03/2017   Lab Results  Component Value Date   PROLACTIN 121.9 (H) 02/03/2017   Lab Results  Component Value Date   CHOL 181 10/23/2017   TRIG 56 10/23/2017   HDL 46 10/23/2017   CHOLHDL 3.9 10/23/2017   VLDL 11 10/23/2017   LDLCALC 124 (H) 10/23/2017   LDLCALC 149 (H) 02/03/2017    Physical Findings: AIMS:  , ,  ,  ,    CIWA:    COWS:     Musculoskeletal: Strength & Muscle Tone: decreased Gait & Station: unsteady Patient leans: N/A  Psychiatric Specialty Exam: Physical Exam  Nursing note and vitals reviewed. Constitutional: She is oriented to person, place, and time.  HENT:  Head: Normocephalic and atraumatic.  Cardiovascular: Regular rhythm.  Respiratory: No respiratory distress.  Neurological: She is alert and oriented to person, place, and time.  Psychiatric: Her speech is normal. Cognition and memory are normal.    Review of Systems  Constitutional: Negative.   HENT: Negative.   Eyes: Negative.   Respiratory: Negative.   Cardiovascular: Negative.   Gastrointestinal: Negative.   Musculoskeletal: Negative.   Skin: Negative.   Neurological: Negative.   Psychiatric/Behavioral: Positive for depression and suicidal ideas. Negative for hallucinations, memory loss and substance abuse. The patient has insomnia. The patient is not nervous/anxious.     Blood pressure 123/63, pulse 76, temperature 98 F (36.7 C), temperature source Oral, resp. rate 18, height 5' (1.524 m), weight 51.7 kg (114  lb), last menstrual period 10/24/2017, SpO2 99 %.Body mass index is 22.26 kg/m.  General Appearance: Casual  Eye Contact:  Good  Speech:  slow  Volume:  Decreased  Mood:  Depressed  Affect:  Congruent  Thought Process:  Goal Directed  Orientation:  Full (Time, Place, and Person)  Thought Content:  Logical  Suicidal Thoughts:  Yes.  without intent/plan  Homicidal Thoughts:  No  Memory:  Immediate;   Good Recent;   Fair Remote;  Fair  Judgement:  Fair  Insight:  Fair  Psychomotor Activity:  Normal  Concentration:  Concentration: Fair  Recall:  [employer]Fair  Fund of Knowledge:  Fair  Language:  Fair  Akathisia:  No  Handed:    AIMS (if indicated):     Assets:  Desire for Improvement Housing Resilience Social Support  ADL's:  Intact  Cognition:  WNL  Sleep:  Number of Hours: 5     Treatment Plan Summary: Pt still depressed and reports intermittent SI. Getting ECT, next on mon. Increase Trazodone for sleep.  Cont Cymbalta, seroquel, buspar. Beverly SessionsJagannath Saban Heinlen, MD 10/29/2017, 11:44 AMPatient ID: Natasha SouthwardJenifer [name], female   DOB: July 19, 1971, 46 y.o.   MRN: [id number]

## 2017-10-29 NOTE — [facility] Group Notes (Signed)
10/29/2017 1:15pm  Type of Therapy and Topic:  Group Therapy:  Fears and Unhealthy Coping Skills  Participation Level:  Active   Description of Group:  The focus of this group was to discuss some of the prevalent fears that patients experience, and to identify the commonalities among group members.  An exercise was used to initiate the discussion, followed by writing on the white board a group-generated list of unhealthy coping and healthy coping techniques to deal with each fear.    Therapeutic Goals: 1. Patient will identify and describe 3 fears they experience 2. Patient will identify one positive coping strategy for each fear they experience 3. Patient will respond empathically to peers statements regarding fears they experience  Summary of Patient Progress:  Patient identified loneliness and failure as some of her prevalent fears she has experienced. She reports that she feels stuck and does not do anything about it, she called it "mind numbing". Pt reports that she feels lonely. She states that she picked up reading and forced herself to stay out of her head. Pt actively participated in group discussion.     Therapeutic Modalities Cognitive Behavioral Therapy Motivational Interviewing  Yitzel Shasteen  CUEBAS-COLON, LCSWA 10/29/2017 12:22 PM

## 2017-10-29 NOTE — Progress Notes (Signed)
Patient presents pleasant and cooperative with care. States feeling much better. No suicidal thoughts today. Denies HI, AVH. Pt eating and drinking well. Able to self propel in wheelchair. In no distress. Attends group, reports group is helping.  Encouragement and support provided, medications given as prescribed. Pt receptive and remains safe on unit with q 15 min checks.

## 2017-10-30 MED ORDER — LOPERAMIDE HCL 2 MG PO CAPS
4.0000 mg | ORAL_CAPSULE | Freq: Once | ORAL | Status: AC
  Administered 2017-10-30: 4 mg via ORAL
  Filled 2017-10-30: qty 2

## 2017-10-30 MED ORDER — TRAZODONE HCL 100 MG PO TABS
200.0000 mg | ORAL_TABLET | Freq: Every day | ORAL | Status: DC
  Administered 2017-10-30 – 2017-10-31 (×2): 200 mg via ORAL
  Filled 2017-10-30 (×2): qty 2

## 2017-10-30 NOTE — Progress Notes (Signed)
[facility]  10/30/2017 11:20 AM Natasha Lubertha SouthKatherine [name]  MRN:  [id number] Subjective:  Patient reportedly groggy in am, now alert, will decrease hs trazodone.  states she is feeling better , less depressed, denies SI.   next /4th ECT  tomorrow.  More social in dayroom.  Principal Problem: Severe recurrent major depression without psychotic features (HCC) Diagnosis:   Patient Active Problem List   Diagnosis Date Noted  . Severe recurrent major depression without psychotic features (HCC) [F33.2] 10/21/2017  . Paraplegia (HCC) [G82.20] 10/21/2017  . GAD (generalized anxiety disorder) [F41.1] 10/20/2017  . MDD (major depressive disorder), recurrent episode, severe (HCC) [F33.2] 10/18/2017  . Major depressive disorder, recurrent, severe without psychotic features (HCC) [F33.2]    Total Time spent with patient: 30 minutes  Past Psychiatric History: Patient has a long-standing history of severe recurrent major depression with a history of more than one serious past suicide attempt  Past Medical History:  Past Medical History:  Diagnosis Date  . C6 spinal cord injury (HCC)   . Depression     Past Surgical History:  Procedure Laterality Date  . BACK SURGERY    . NECK SURGERY     Family History:  Family History  Problem Relation Age of Onset  . Alcohol abuse Father   . Alcohol abuse Brother   . Depression Mother   . Alcohol abuse Mother    Family Psychiatric  History: Unknown Social History:  Social History   Substance and Sexual Activity  Alcohol Use No  . Alcohol/week: 0.0 oz   Comment: Occasional use     Social History   Substance and Sexual Activity  Drug Use No    Social History   Socioeconomic History  . Marital status: Single    Spouse name: None  . Number of children: None  . Years of education: None  . Highest education level: None  Social Needs  . Financial resource strain: None  . Food insecurity - worry: None  . Food insecurity - inability:  None  . Transportation needs - medical: None  . Transportation needs - non-medical: None  Occupational History  . None  Tobacco Use  . Smoking status: Former Games developermoker  . Smokeless tobacco: Never Used  . Tobacco comment: No smoking hx; no need for cessation materials  Substance and Sexual Activity  . Alcohol use: No    Alcohol/week: 0.0 oz    Comment: Occasional use  . Drug use: No  . Sexual activity: No  Other Topics Concern  . None  Social History Narrative  . None   Additional Social History:                         Sleep: Fair  Appetite:  Fair  Current Medications: Current Facility-Administered Medications  Medication Dose Route Frequency Provider Last Rate Last Dose  . acetaminophen (TYLENOL) tablet 650 mg  650 mg Oral Q6H PRN Clapacs, Jackquline DenmarkJohn T, MD   650 mg at 10/28/17 2153  . alum & mag hydroxide-simeth (MAALOX/MYLANTA) 200-200-20 MG/5ML suspension 30 mL  30 mL Oral Q4H PRN Clapacs, John T, MD      . busPIRone (BUSPAR) tablet 10 mg  10 mg Oral TID Clapacs, Jackquline DenmarkJohn T, MD   10 mg at 10/30/17 0835  . DULoxetine (CYMBALTA) DR capsule 60 mg  60 mg Oral Daily Clapacs, Jackquline DenmarkJohn T, MD   60 mg at 10/30/17 0835  . famotidine (PEPCID) tablet 20 mg  20  mg Oral BID Clapacs, Jackquline DenmarkJohn T, MD   20 mg at 10/30/17 0835  . fentaNYL (SUBLIMAZE) injection 25 mcg  25 mcg Intravenous Q5 min PRN Yves Dillarroll, Paul, MD      . magnesium hydroxide (MILK OF MAGNESIA) suspension 30 mL  30 mL Oral Daily PRN Clapacs, John T, MD      . QUEtiapine (SEROQUEL) tablet 25 mg  25 mg Oral BID Clapacs, Jackquline DenmarkJohn T, MD   25 mg at 10/30/17 0836  . traZODone (DESYREL) tablet 200 mg  200 mg Oral QHS Beverly SessionsSubedi, Donathan Buller, MD        Lab Results:  No results found for this or any previous visit (from the past 48 hour(s)).  Blood Alcohol level:  Lab Results  Component Value Date   ETH <10 10/18/2017   ETH <5 08/15/2017    Metabolic Disorder Labs: Lab Results  Component Value Date   HGBA1C 4.7 (L) 10/23/2017   MPG 88.19  10/23/2017   MPG 88 02/03/2017   Lab Results  Component Value Date   PROLACTIN 121.9 (H) 02/03/2017   Lab Results  Component Value Date   CHOL 181 10/23/2017   TRIG 56 10/23/2017   HDL 46 10/23/2017   CHOLHDL 3.9 10/23/2017   VLDL 11 10/23/2017   LDLCALC 124 (H) 10/23/2017   LDLCALC 149 (H) 02/03/2017    Physical Findings: AIMS:  , ,  ,  ,    CIWA:    COWS:     Musculoskeletal: Strength & Muscle Tone: decreased Gait & Station: unsteady Patient leans: N/A  Psychiatric Specialty Exam: Physical Exam  Nursing note and vitals reviewed. Constitutional: She is oriented to person, place, and time.  HENT:  Head: Normocephalic and atraumatic.  Cardiovascular: Regular rhythm.  Respiratory: No respiratory distress.  Neurological: She is alert and oriented to person, place, and time.  Psychiatric: Her speech is normal. Cognition and memory are normal.    Review of Systems  Constitutional: Negative.   HENT: Negative.   Eyes: Negative.   Respiratory: Negative.   Cardiovascular: Negative.   Gastrointestinal: Negative.   Musculoskeletal: Negative.   Skin: Negative.   Neurological: Negative.   Psychiatric/Behavioral: Positive for depression and suicidal ideas. Negative for hallucinations, memory loss and substance abuse. The patient has insomnia. The patient is not nervous/anxious.     Blood pressure 108/78, pulse 94, temperature 98 F (36.7 C), temperature source Oral, resp. rate 18, height 5' (1.524 m), weight 51.7 kg (114 lb), last menstrual period 10/24/2017, SpO2 99 %.Body mass index is 22.26 kg/m.  General Appearance: Casual  Eye Contact:  Good  Speech:  slow  Volume:  Decreased  Mood:  Depressed  Affect:  Congruent  Thought Process:  Goal Directed  Orientation:  Full (Time, Place, and Person)  Thought Content:  Logical  Suicidal Thoughts:  denies  Homicidal Thoughts:  No  Memory:  Immediate;   Good Recent;   Fair Remote;   Fair  Judgement:  Fair  Insight:   Fair  Psychomotor Activity:  Normal  Concentration:  Concentration: Fair  Recall:  [employer]Fair  Fund of Knowledge:  Fair  Language:  Fair  Akathisia:  No  Handed:    AIMS (if indicated):     Assets:  Desire for Improvement Housing Resilience Social Support  ADL's:  Intact  Cognition:  WNL  Sleep:  Number of Hours: 5.45     Treatment Plan Summary: Pt still depressed .  Getting ECT, next on mon. decrease Trazodone due to am  sedation. D/c prn clonazepam as pt is getting ECT.   Cont Cymbalta, seroquel, buspar. Beverly Sessions, MD 10/30/2017, 11:20 AMPatient ID: Natasha [name], female   DOB: [birth year], 46 y.o.   MRN: [id number] Patient ID: Natasha [name], female   DOB: 1971-01-20, 46 y.o.   MRN: [id number]

## 2017-10-30 NOTE — Progress Notes (Addendum)
Up in the dayroom. Reading her 3rd book since admission. States she feels good. Rates anxiety and depression 4/5. Took trazodone 200mg  at HS. NPO after midnight for ECT. Remains on routine obs for safety.  0030 States she has had diarrhea all day. Has not told anyone. Clothing washed, MD called for one time imodium 4 mg order. Given at 0055. Effect is pending.

## 2017-10-30 NOTE — Plan of Care (Signed)
Patient stated that her goal for today is to interact more and stay awake.Patient is out in the milieu.Denies SI,HI and AVH.Appropriate with staff & peers.Compliant with medications.Appetite and energy level good.Support and encouragement given.

## 2017-10-30 NOTE — [facility] Group Notes (Signed)
10/30/2017 1:15pm  Type of Therapy and Topic:  Group Therapy:  Healthy Self Image and Positive Change  Participation Level:  Active   Description of Group:  In this group, patients will compare and contrast their current "I am...." statements to the visions they identify as desirable for their lives.  Patients discuss fears and how they can make positive changes in their cognitions that will positively impact their behaviors.  Facilitator played a motivational 3-minute speech and patients were left with the task of thinking about what "I am...." statements they can start using in their lives immediately.  Therapeutic Goals: 1. Patient will state their current self-perception as expressed in an "I Am" statement 2. Patient will contrast this with their desired vision for their live 3. Patient will identify 3 fears that negatively impact their behavior 4. Patient will discuss cognitive distortions that stem from their fears 5. Patient will verbalize statements that challenge their cognitive distortions  Summary of Patient Progress:  The patient expressed her feelings about self image and positive change, she states "is a pretty good today". Patient states "I am determined" She indicates that she has a hard time asking for help. Pt identified her cognitive distortions related to her fears and the challenges to stay positive. Pt actively participated in group.     Therapeutic Modalities Cognitive Behavioral Therapy Motivational Interviewing  Bently Wyss  CUEBAS-COLON, LCSWA 10/30/2017 12:22 PM

## 2017-10-31 ENCOUNTER — Encounter: Payer: Self-pay | Admitting: Registered Nurse

## 2017-10-31 LAB — GLUCOSE, CAPILLARY: Glucose-Capillary: 90 mg/dL (ref 65–99)

## 2017-10-31 MED ORDER — SUCCINYLCHOLINE CHLORIDE 20 MG/ML IJ SOLN
INTRAMUSCULAR | Status: AC
  Filled 2017-10-31: qty 1

## 2017-10-31 MED ORDER — BUSPIRONE HCL 10 MG PO TABS: 10.0000 mg | ORAL_TABLET | Freq: Three times a day (TID) | ORAL | 0 refills | Status: DC

## 2017-10-31 MED ORDER — TRAZODONE HCL 100 MG PO TABS: 200.0000 mg | ORAL_TABLET | Freq: Every day | ORAL | 0 refills | Status: DC

## 2017-10-31 MED ORDER — DULOXETINE HCL 60 MG PO CPEP: 60.0000 mg | ORAL_CAPSULE | Freq: Every day | ORAL | 0 refills | Status: DC

## 2017-10-31 MED ORDER — METHOHEXITAL SODIUM 0.5 G IJ SOLR
INTRAMUSCULAR | Status: AC
  Filled 2017-10-31: qty 500

## 2017-10-31 MED ORDER — LOPERAMIDE HCL 2 MG PO CAPS
4.0000 mg | ORAL_CAPSULE | Freq: Once | ORAL | Status: AC
  Administered 2017-10-31: 4 mg via ORAL
  Filled 2017-10-31: qty 2

## 2017-10-31 MED ORDER — QUETIAPINE FUMARATE 25 MG PO TABS
25.0000 mg | ORAL_TABLET | Freq: Two times a day (BID) | ORAL | 0 refills | Status: DC
Start: 2017-10-31 — End: 2017-12-01

## 2017-10-31 NOTE — Progress Notes (Signed)
D: Patient stated slept good last night .Stated appetite is good and energy level  Is normal. Stated concentration is good . Stated on Depression scale , hopeless and anxiety .( low 0-10 high) Denies suicidal  homicidal ideations  .  No auditory hallucinations  No pain concerns . Appropriate ADL'S. Interacting with peers and staff.  Patient had diarrhea yesterday none today . Aware of possible discharge  Tomorrow         A: Encourage patient participation with unit programming . Instruction  Given on  Medication , verbalize understanding. R: Voice no other concerns. Staff continue to monitor

## 2017-10-31 NOTE — [facility] Suicide Risk Assessment (Signed)
[facility]   Principal Problem: Severe recurrent major depression without psychotic features [facility](HCC) Discharge Diagnoses:  Patient Active Problem List   Diagnosis Date Noted  . Severe recurrent major depression without psychotic features (HCC) [F33.2] 10/21/2017  . Paraplegia (HCC) [G82.20] 10/21/2017  . GAD (generalized anxiety disorder) [F41.1] 10/20/2017  . MDD (major depressive disorder), recurrent episode, severe (HCC) [F33.2] 10/18/2017  . Major depressive disorder, recurrent, severe without psychotic features (HCC) [F33.2]     Total Time spent with patient: 30 minutes  Musculoskeletal: Strength & Muscle Tone: decreased Gait & Station: unsteady Patient leans: N/A  Psychiatric Specialty Exam: Review of Systems  Constitutional: Negative.   HENT: Negative.   Eyes: Negative.   Respiratory: Negative.   Cardiovascular: Negative.   Gastrointestinal: Negative.   Musculoskeletal: Negative.   Skin: Negative.   Neurological: Positive for focal weakness.  Psychiatric/Behavioral: Negative.     Blood pressure (!) 95/58, pulse 79, temperature 98.2 F (36.8 C), temperature source Oral, resp. rate 18, height 5' (1.524 m), weight 51.7 kg (114 lb), last menstrual period 10/24/2017, SpO2 99 %.Body mass index is 22.26 kg/m.  General Appearance: Casual  Eye Contact::  Good  Speech:  Clear and Coherent409  Volume:  Normal  Mood:  Euthymic  Affect:  Appropriate  Thought Process:  Goal Directed  Orientation:  Full (Time, Place, and Person)  Thought Content:  Logical  Suicidal Thoughts:  No  Homicidal Thoughts:  No  Memory:  Immediate;   Fair Recent;   Fair Remote;   Fair  Judgement:  Good  Insight:  Good  Psychomotor Activity:  Normal  Concentration:  Fair  Recall:  F[employer]ervFair  Fund of Knowledge:Fair  Language: Fair  Akathisia:  No  Handed:  Right  AIMS (if indicated):     Assets:  Desire for Improvement Financial  Resources/Insurance Housing Resilience Social Support  Sleep:  Number of Hours: 4.45  Cognition: WNL  ADL's:  Intact   Mental Status Per Nursing Assessment::   On Admission:     Demographic Factors:  Caucasian and Living alone  Loss Factors: Loss of significant relationship  Historical Factors: Prior suicide attempts  Risk Reduction Factors:   Positive social support, Positive therapeutic relationship and Positive coping skills or problem solving skills  Continued Clinical Symptoms:  Depression:   Severe  Cognitive Features That Contribute To Risk:  Thought constriction (tunnel vision)    Suicide Risk:  Mild:  Suicidal ideation of limited frequency, intensity, duration, and specificity.  There are no identifiable plans, no associated intent, mild dysphoria and related symptoms, good self-control (both objective and subjective assessment), few other risk factors, and identifiable protective factors, including available and accessible social support.  Follow-up Information    [facility] Follow up on 11/02/2017.   Specialty:  Behavioral Health Why:  Patient current w Dr Vanetta ShawlHisada for medications management, next appointment is Wednesday, 11/02/17 at 9:15 AM.   Contact information: [address] [place] [id number] [phone]       [facility]. Go on 11/09/2017.   Why:  Please attend your ECT appointment on Wednesday, 11/09/17.  Outpatient ECT staff will contact you to give you details on the time of your appointment time. Contact information: [id number] [place] [id number] [phone]          Plan Of Care/Follow-up recommendations:  Activity:  Activity as tolerated Diet:  Regular diet Other:  Continue outpatient treatment including maintenance  ECT and treatment here next 1 on 28 November  Mordecai RasmussenJohn Clapacs, MD 10/31/2017, 4:54 PM

## 2017-10-31 NOTE — [facility] Group Notes (Signed)
[facility] Group Notes:  (Nursing/MHT/Case Management/Adjunct)  Date:  10/31/2017  Time:  9:29 PM  Type of Therapy:  Evening Wrap-up Group  Participation Level:  Active  Participation Quality:  Appropriate and Attentive  Affect:  Appropriate  Cognitive:  Alert and Appropriate  Insight:  Appropriate and Improving  Engagement in Group:  Developing/Improving and Engaged  Modes of Intervention:  Discussion  Summary of Progress/Problems:  Tomasita MorrowChelsea Nanta Endia Moncur 10/31/2017, 9:29 PM

## 2017-10-31 NOTE — Progress Notes (Signed)
[facility]  10/31/2017 4:45 PM Natasha [name]  MRN:  [id number] Subjective: "I am feeling better".  Patient seen.  She refused ECT this morning.  I saw her this afternoon and she tells me the reason is that she had a very bad stomachache and had been throwing up and having diarrhea this morning.  At the same time she reports that her mood is significantly better.  She reports that her depression is at a much lower level than previously and she denies any current thoughts about wanting to die or wanting to kill herself.  No new physical complaints.  I read as of this afternoon the stomach problems are easing off.  Patient is starting to feel comfortable with the idea of going home. Principal Problem: Severe recurrent major depression without psychotic features (HCC) Diagnosis:   Patient Active Problem List   Diagnosis Date Noted  . Severe recurrent major depression without psychotic features (HCC) [F33.2] 10/21/2017  . Paraplegia (HCC) [G82.20] 10/21/2017  . GAD (generalized anxiety disorder) [F41.1] 10/20/2017  . MDD (major depressive disorder), recurrent episode, severe (HCC) [F33.2] 10/18/2017  . Major depressive disorder, recurrent, severe without psychotic features (HCC) [F33.2]    Total Time spent with patient: 30 minutes  Past Psychiatric History: Long history of recurrent severe depression with serious suicide attempts.  Good response to ECT  Past Medical History:  Past Medical History:  Diagnosis Date  . C6 spinal cord injury (HCC)   . Depression     Past Surgical History:  Procedure Laterality Date  . BACK SURGERY    . NECK SURGERY     Family History:  Family History  Problem Relation Age of Onset  . Alcohol abuse Father   . Alcohol abuse Brother   . Depression Mother   . Alcohol abuse Mother    Family Psychiatric  History: Positive for anxiety and depression Social History:  Social History   Substance and Sexual Activity  Alcohol Use No  .  Alcohol/week: 0.0 oz   Comment: Occasional use     Social History   Substance and Sexual Activity  Drug Use No    Social History   Socioeconomic History  . Marital status: Single    Spouse name: None  . Number of children: None  . Years of education: None  . Highest education level: None  Social Needs  . Financial resource strain: None  . Food insecurity - worry: None  . Food insecurity - inability: None  . Transportation needs - medical: None  . Transportation needs - non-medical: None  Occupational History  . None  Tobacco Use  . Smoking status: Former Games developermoker  . Smokeless tobacco: Never Used  . Tobacco comment: No smoking hx; no need for cessation materials  Substance and Sexual Activity  . Alcohol use: No    Alcohol/week: 0.0 oz    Comment: Occasional use  . Drug use: No  . Sexual activity: No  Other Topics Concern  . None  Social History Narrative  . None   Additional Social History:                         Sleep: Fair  Appetite:  Fair  Current Medications: Current Facility-Administered Medications  Medication Dose Route Frequency Provider Last Rate Last Dose  . acetaminophen (TYLENOL) tablet 650 mg  650 mg Oral Q6H PRN Kasheem Toner, Jackquline DenmarkJohn T, MD   650 mg at 10/28/17 2153  . alum &  mag hydroxide-simeth (MAALOX/MYLANTA) 200-200-20 MG/5ML suspension 30 mL  30 mL Oral Q4H PRN Lon Klippel T, MD      . busPIRone (BUSPAR) tablet 10 mg  10 mg Oral TID Madisen Ludvigsen, Jackquline DenmarkJohn T, MD   10 mg at 10/31/17 1209  . DULoxetine (CYMBALTA) DR capsule 60 mg  60 mg Oral Daily Grantham Hippert, Jackquline DenmarkJohn T, MD   60 mg at 10/31/17 1209  . famotidine (PEPCID) tablet 20 mg  20 mg Oral BID Carmie Lanpher, Jackquline DenmarkJohn T, MD   20 mg at 10/31/17 1208  . fentaNYL (SUBLIMAZE) injection 25 mcg  25 mcg Intravenous Q5 min PRN Yves Dillarroll, Paul, MD      . magnesium hydroxide (MILK OF MAGNESIA) suspension 30 mL  30 mL Oral Daily PRN Dariona Postma T, MD      . QUEtiapine (SEROQUEL) tablet 25 mg  25 mg Oral BID Jessia Kief, Jackquline DenmarkJohn T,  MD   25 mg at 10/31/17 1210  . traZODone (DESYREL) tablet 200 mg  200 mg Oral QHS Beverly SessionsSubedi, Jagannath, MD   200 mg at 10/30/17 2212    Lab Results:  Results for orders placed or performed during the hospital encounter of 10/21/17 (from the past 48 hour(s))  Glucose, capillary     Status: None   Collection Time: 10/31/17  7:09 AM  Result Value Ref Range   Glucose-Capillary 90 65 - 99 mg/dL   Comment 1 Notify RN     Blood Alcohol level:  Lab Results  Component Value Date   ETH <10 10/18/2017   ETH <5 08/15/2017    Metabolic Disorder Labs: Lab Results  Component Value Date   HGBA1C 4.7 (L) 10/23/2017   MPG 88.19 10/23/2017   MPG 88 02/03/2017   Lab Results  Component Value Date   PROLACTIN 121.9 (H) 02/03/2017   Lab Results  Component Value Date   CHOL 181 10/23/2017   TRIG 56 10/23/2017   HDL 46 10/23/2017   CHOLHDL 3.9 10/23/2017   VLDL 11 10/23/2017   LDLCALC 124 (H) 10/23/2017   LDLCALC 149 (H) 02/03/2017    Physical Findings: AIMS:  , ,  ,  ,    CIWA:    COWS:     Musculoskeletal: Strength & Muscle Tone: decreased Gait & Station: unsteady Patient leans: N/A  Psychiatric Specialty Exam: Physical Exam  Nursing note and vitals reviewed. Constitutional: She appears well-developed and well-nourished.  HENT:  Head: Normocephalic and atraumatic.  Eyes: Conjunctivae are normal. Pupils are equal, round, and reactive to light.  Neck: Normal range of motion.  Cardiovascular: Regular rhythm and normal heart sounds.  Respiratory: Effort normal. No respiratory distress.  GI: Soft.  Musculoskeletal: Normal range of motion.  Neurological: She is alert.  Paraplegia as noted previously no change to current situation  Skin: Skin is warm and dry.  Psychiatric: She has a normal mood and affect. Her speech is normal and behavior is normal. Judgment and thought content normal. Cognition and memory are normal.    Review of Systems  Constitutional: Negative.   HENT:  Negative.   Eyes: Negative.   Respiratory: Negative.   Cardiovascular: Negative.   Gastrointestinal: Positive for abdominal pain and vomiting.  Musculoskeletal: Negative.   Skin: Negative.   Neurological: Negative.   Psychiatric/Behavioral: Negative for depression, hallucinations, memory loss, substance abuse and suicidal ideas. The patient is not nervous/anxious and does not have insomnia.     Blood pressure (!) 95/58, pulse 79, temperature 98.2 F (36.8 C), temperature source Oral, resp. rate 18, height  5' (1.524 m), weight 51.7 kg (114 lb), last menstrual period 10/24/2017, SpO2 99 %.Body mass index is 22.26 kg/m.  General Appearance: Casual  Eye Contact:  Good  Speech:  Clear and Coherent  Volume:  Normal  Mood:  Euthymic  Affect:  Constricted  Thought Process:  Goal Directed  Orientation:  Full (Time, Place, and Person)  Thought Content:  Logical  Suicidal Thoughts:  No  Homicidal Thoughts:  No  Memory:  Immediate;   Fair Recent;   Fair Remote;   Fair  Judgement:  Good  Insight:  Fair  Psychomotor Activity:  Decreased  Concentration:  Concentration: Fair  Recall:  [employer] of Knowledge:  Fair  Language:  Fair  Akathisia:  No  Handed:  Right  AIMS (if indicated):     Assets:  Communication Skills Desire for Improvement Housing Resilience Social Support  ADL's:  Intact  Cognition:  WNL  Sleep:  Number of Hours: 4.45     Treatment Plan Summary: Daily contact with patient to assess and evaluate symptoms and progress in treatment, Medication management and Plan Patient is consistently reporting significant improvement.  Affect brighter.  Participating well in groups.  Good insight.  She did not receive ECT today but appears to have stabilized and wants to discuss discharge.  She has a ride already set up for tomorrow and had been tentatively planning for discharge at that time.  We will go ahead and proceed with discharge.  Orders and prescriptions done.  Patient is  asking me if she can transfer her maintenance ECT treatment to our facility because of her convenience.  I told her that I was agreeable to that.  We will schedule ECT on the 28th a week from Wednesday.  Case reviewed with social work and nursing.  Orders completed.  Mordecai Rasmussen, MD 10/31/2017, 4:45 PM

## 2017-10-31 NOTE — Anesthesia Postprocedure Evaluation (Signed)
Anesthesia Post Note  Patient: Natasha SouthwardJenifer Katherine Pomeroy  Procedure(s) Performed: ECT TX  Patient location during evaluation: PACU Anesthesia Type: General Level of consciousness: awake and alert Pain management: pain level controlled Vital Signs Assessment: post-procedure vital signs reviewed and stable Respiratory status: spontaneous breathing, nonlabored ventilation, respiratory function stable and patient connected to nasal cannula oxygen Cardiovascular status: blood pressure returned to baseline and stable Postop Assessment: no apparent nausea or vomiting Anesthetic complications: no     Last Vitals:  Vitals:   10/30/17 0637 10/31/17 0657  BP: 108/78 (!) 95/58  Pulse: 94 79  Resp: 18 18  Temp: 36.7 C 36.8 C  SpO2:      Last Pain:  Vitals:   10/31/17 0900  TempSrc:   PainSc: 0-No pain                 Lenard SimmerAndrew Lakela Kuba

## 2017-10-31 NOTE — Discharge Summary (Signed)
Physician Discharge Summary Note  Patient:  Natasha [name] is an 46 y.o., female MRN:  [id number] DOB:  04/10/1971 Patient phone:  [phone] (home)  Patient address:   [address] [place] [address],  Total Time spent with patient: 30 minutes  Date of Admission:  10/21/2017 Date of Discharge: November 01, 2017  Reason for Admission: Patient was admitted in transfer from behavioral health [facility] in order to pursue ECT treatment for her severe depression.  Principal Problem: Severe recurrent major depression without psychotic features [facility]) Discharge Diagnoses: Patient Active Problem List   Diagnosis Date Noted  . Severe recurrent major depression without psychotic features (HCC) [F33.2] 10/21/2017  . Paraplegia (HCC) [G82.20] 10/21/2017  . GAD (generalized anxiety disorder) [F41.1] 10/20/2017  . MDD (major depressive disorder), recurrent episode, severe (HCC) [F33.2] 10/18/2017  . Major depressive disorder, recurrent, severe without psychotic features (HCC) [F33.2]     Past Psychiatric History: Patient has a long history of recurrent episodes of depression with past serious suicide attempts.  Had been responding well to maintenance ECT.  Recent downturn in mood at least partially related to her mother have to move into assisted living which cause the patient to become lonely at home.  Past Medical History:  Past Medical History:  Diagnosis Date  . C6 spinal cord injury (HCC)   . Depression     Past Surgical History:  Procedure Laterality Date  . BACK SURGERY    . NECK SURGERY     Family History:  Family History  Problem Relation Age of Onset  . Alcohol abuse Father   . Alcohol abuse Brother   . Depression Mother   . Alcohol abuse Mother    Family Psychiatric  History: Depression Social History:  Social History   Substance and Sexual Activity  Alcohol Use No  . Alcohol/week: 0.0 oz   Comment: Occasional use     Social History   Substance  and Sexual Activity  Drug Use No    Social History   Socioeconomic History  . Marital status: Single    Spouse name: None  . Number of children: None  . Years of education: None  . Highest education level: None  Social Needs  . Financial resource strain: None  . Food insecurity - worry: None  . Food insecurity - inability: None  . Transportation needs - medical: None  . Transportation needs - non-medical: None  Occupational History  . None  Tobacco Use  . Smoking status: Former Games developer  . Smokeless tobacco: Never Used  . Tobacco comment: No smoking hx; no need for cessation materials  Substance and Sexual Activity  . Alcohol use: No    Alcohol/week: 0.0 oz    Comment: Occasional use  . Drug use: No  . Sexual activity: No  Other Topics Concern  . None  Social History Narrative  . None    Hospital Course: Patient was admitted to the hospital and was completely agreeable to the treatment plan.  Cooperative with workup.  Agreed to ECT treatment.  Patient has had a total of 3 right unilateral ECT treatments all given last week.  At the end of the week she still reported having some concerns about safety and we had been planning ECT for today.  Patient came down with some sort of stomach bug this morning and canceled her treatment.  Today she reports however that her mood is significantly improved and she no longer feels suicidal and feels like she has good control  over her behavior at home.  Plan is now for discharge home and maintenance ECT and medication management.  During her hospital stay she did not engage in any dangerous behavior and was cooperative and appeared to be working hard in groups and social interactions.  Physical Findings: AIMS:  , ,  ,  ,    CIWA:    COWS:     Musculoskeletal: Strength & Muscle Tone: decreased Gait & Station: unsteady Patient leans: N/A  Psychiatric Specialty Exam: Physical Exam  Nursing note and vitals reviewed. Constitutional: She  appears well-developed and well-nourished.  HENT:  Head: Normocephalic and atraumatic.  Eyes: Conjunctivae are normal. Pupils are equal, round, and reactive to light.  Neck: Normal range of motion.  Cardiovascular: Regular rhythm and normal heart sounds.  Respiratory: Effort normal. No respiratory distress.  GI: Soft.  Musculoskeletal: Normal range of motion.  Neurological: She is alert.  Patient is paraplegic with chronic neurologic disability as noted elsewhere.  No change.  Skin: Skin is warm and dry.  Psychiatric: She has a normal mood and affect. Her speech is normal and behavior is normal. Judgment and thought content normal. Cognition and memory are normal.    Review of Systems  Constitutional: Negative.   HENT: Negative.   Eyes: Negative.   Respiratory: Negative.   Cardiovascular: Negative.   Gastrointestinal: Negative.   Musculoskeletal: Negative.   Skin: Negative.   Neurological: Positive for focal weakness.    Blood pressure (!) 95/58, pulse 79, temperature 98.2 F (36.8 C), temperature source Oral, resp. rate 18, height 5' (1.524 m), weight 51.7 kg (114 lb), last menstrual period 10/24/2017, SpO2 99 %.Body mass index is 22.26 kg/m.  General Appearance: Casual  Eye Contact:  Good  Speech:  Clear and Coherent  Volume:  Normal  Mood:  Euthymic  Affect:  Congruent  Thought Process:  Goal Directed  Orientation:  Full (Time, Place, and Person)  Thought Content:  Logical  Suicidal Thoughts:  No  Homicidal Thoughts:  No  Memory:  Immediate;   Good Recent;   Good Remote;   Good  Judgement:  Good  Insight:  Good  Psychomotor Activity:  Normal  Concentration:  Concentration: Fair  Recall:  Fair  Fund of Knowledge:  Fair  Language:  Fair  Akathisia:  No  Handed:  Right  AIMS (if indicated):     Assets:  Communication Skills Desire for Improvement Financial Resources/Insurance Housing Resilience Social Support  ADL's:  Intact  Cognition:  WNL  Sleep:  Number  of Hours: 4.45        Has this patient used any form of tobacco in the last 30 days? (Cigarettes, Smokeless Tobacco, Cigars, and/or Pipes) Yes, No  Blood Alcohol level:  Lab Results  Component Value Date   ETH <10 10/18/2017   ETH <5 08/15/2017    Metabolic Disorder Labs:  Lab Results  Component Value Date   HGBA1C 4.7 (L) 10/23/2017   MPG 88.19 10/23/2017   MPG 88 02/03/2017   Lab Results  Component Value Date   PROLACTIN 121.9 (H) 02/03/2017   Lab Results  Component Value Date   CHOL 181 10/23/2017   TRIG 56 10/23/2017   HDL 46 10/23/2017   CHOLHDL 3.9 10/23/2017   VLDL 11 10/23/2017   LDLCALC 124 (H) 10/23/2017   LDLCALC 149 (H) 02/03/2017    See Psychiatric Specialty Exam and Suicide Risk Assessment completed by Attending Physician prior to discharge.  Discharge destination:  Home  Is patient on  multiple antipsychotic therapies at discharge:  No   Has Patient had three or more failed trials of antipsychotic monotherapy by history:  No  Recommended Plan for Multiple Antipsychotic Therapies: NA  Discharge Instructions    Diet - low sodium heart healthy   Complete by:  As directed    Increase activity slowly   Complete by:  As directed      Allergies as of 10/31/2017   No Known Allergies     Medication List    STOP taking these medications   acetaminophen 325 MG tablet Commonly known as:  TYLENOL   alum & mag hydroxide-simeth 200-200-20 MG/5ML suspension Commonly known as:  MAALOX/MYLANTA   feeding supplement (ENSURE ENLIVE) Liqd   magnesium hydroxide 400 MG/5ML suspension Commonly known as:  MILK OF MAGNESIA   ondansetron 4 MG tablet Commonly known as:  ZOFRAN     TAKE these medications     Indication  busPIRone 10 MG tablet Commonly known as:  BUSPAR Take 1 tablet (10 mg total) 3 (three) times daily by mouth.  Indication:  Anxiety Disorder, Major Depressive Disorder   clonazePAM 0.5 MG tablet Commonly known as:  KLONOPIN Take 1  tablet (0.5 mg total) 2 (two) times daily as needed by mouth (anxiety).  Indication:  Severe anxiety   DULoxetine 60 MG capsule Commonly known as:  CYMBALTA Take 1 capsule (60 mg total) daily by mouth. Start taking on:  11/01/2017  Indication:  Major Depressive Disorder   QUEtiapine 25 MG tablet Commonly known as:  SEROQUEL Take 1 tablet (25 mg total) 2 (two) times daily by mouth.  Indication:  major depression   traZODone 100 MG tablet Commonly known as:  DESYREL Take 2 tablets (200 mg total) at bedtime by mouth. What changed:    how much to take  when to take this  reasons to take this  Indication:  Trouble Sleeping      Follow-up Information    [facility] Follow up on 11/02/2017.   Specialty:  Behavioral Health Why:  Patient current w Dr Vanetta ShawlHisada for medications management, next appointment is Wednesday, 11/02/17 at 9:15 AM.   Contact information: [address] [place] [id number] [phone]       [facility]. Go on 11/09/2017.   Why:  Please attend your ECT appointment on Wednesday, 11/09/17.  Outpatient ECT staff will contact you to give you details on the time of your appointment time. Contact information: [address] [id number] [place] [phone]          Follow-up recommendations:  Activity:  Activity as tolerated Diet:  Regular diet Other:  Follow-up outpatient medication management and maintenance ECT treatment  Comments: Patient requested to me whether we could perform maintenance ECT here at our hospital as it would be more convenient for her than where she had been going previously.  I am perfectly agreeable to provide that service if it works better for her.  We are scheduling a maintenance ECT treatment tentatively for Wednesday the 28th and then will follow up as needed from there.  Signed: Mordecai RasmussenJohn [name] Piontek, MD 10/31/2017, 4:58  PM

## 2017-10-31 NOTE — [facility] Group Notes (Signed)
10/31/2017 0930   Type of Therapy and Topic:  Group Therapy:  Overcoming Obstacles   Participation Level:  Did Not Attend   Description of Group:   In this group patients will be encouraged to explore what they see as obstacles to their own wellness and recovery. They will be guided to discuss their thoughts, feelings, and behaviors related to these obstacles. The group will process together ways to cope with barriers, with attention given to specific choices patients can make. Each patient will be challenged to identify changes they are motivated to make in order to overcome their obstacles. This group will be process-oriented, with patients participating in exploration of their own experiences, giving and receiving support, and processing challenge from other group members.   Therapeutic Goals: 1. Patient will identify personal and current obstacles as they relate to admission. 2. Patient will identify barriers that currently interfere with their wellness or overcoming obstacles.  3. Patient will identify feelings, thought process and behaviors related to these barriers. 4. Patient will identify two changes they are willing to make to overcome these obstacles:      Summary of Patient Progress  Pt did not attend group.    Therapeutic Modalities:   Cognitive Behavioral Therapy Solution Focused Therapy Motivational Interviewing Relapse Prevention Therapy  Heidi DachKelsey Samanthia Howland, MSW, LCSW 10/31/2017 12:45 PM

## 2017-10-31 NOTE — Progress Notes (Addendum)
Recreation Therapy Notes   Date: 11.19.18  Time: 1:00pm  Location: Craft Room  Behavioral response: Appropriate  Intervention Topic: Coping Skills  Discussion/Intervention: Group content on today was focused on coping skills. The group defined what coping skills are and when they can be used. Individuals described how they normally cope with thing and the coping skills they normally use. Patients expressed why it is important to cope with things and how not coping with things can affect you. The group participated in the intervention "My coping box" and made coping boxes while adding coping skills they could use in the future to the box. Clinical Observations/Feedback:  Patient came to group and was focused on the topic at hand. She stated that coping skills keep you productive. Individual identified reading as a coping skill she uses. Individual was social with peers and staff while participating in the intervention.   Cristopher Ciccarelli LRT/CTRS         Quang Thorpe 10/31/2017 2:27 PM

## 2017-10-31 NOTE — [facility] Group Notes (Signed)
[facility] Group Notes:  (Nursing/MHT/Case Management/Adjunct)  Date:  10/31/2017  Time:  2:16 AM  Type of Therapy:  Psychoeducational Skills  Participation Level:  Active  Participation Quality:  Appropriate  Affect:  Appropriate  Cognitive:  Appropriate  Insight:  Appropriate and Good  Engagement in Group:  Engaged  Modes of Intervention:  Discussion, Socialization and Support  Summary of Progress/Problems:  Wilson SingerJustin  Kenyah Luba 10/31/2017, 2:16 AM

## 2017-10-31 NOTE — Plan of Care (Signed)
Patient had compliant of diarrhea last night and yesterday. None today .  Patient attending activities . Voice no concerns around sleep . Verbalize understanding of  information given . Mental status  improved. Coping skills sheet given . No concerns with self control . Patient has met with Education officer, museum for resources necessary for follow up  . Continue to move  about  on unit  via  wheelchair . Staff continue to encourage  call don't fall. Patient able to verbalize medications and what they are for .

## 2017-10-31 NOTE — Progress Notes (Deleted)
BH MD/PA/NP OP Progress Note  10/31/2017 3:55 PM Natasha [name]  MRN:  [id number]  Chief Complaint:  HPI:  - Since the last appointment, [facility] and SI; [facility] for [facility].   Visit Diagnosis: No diagnosis found.  Past Psychiatric History:  I have reviewed the patient's psychiatry history in detail and updated the patient record. Outpatient: She reports depression since 1993, since her friend deceased. She reports doing well without medication 2002-2009. She used to be seen by Dr. Lolly MustacheArfeen, last in 03/2017, diagnosed with MDD without psychotic features. Sees Dr. Minus LibertyGligorovic for ECT Psychiatry admission[facility]. [facility] [place] in September 2018 for depression, SI. [facility], SI Previous suicide attempt: "more than I can count", overdose on medication, cutting her wrist, last in 2002 by jumping from the parking lot at [facility] after [facility], which causedspinal injury at C6/7 Past trials of medication: Lexapro, Prozac, Paxil, Zoloft, Effexor, Wellbutrin, Remeron, Depakote, Tegretol, lithium, trazodone, Geodon, Risperdal, olanzapine, Abilify History of violence: denies  Past Medical History:  Past Medical History:  Diagnosis Date  . C6 spinal cord injury (HCC)   . Depression     Past Surgical History:  Procedure Laterality Date  . BACK SURGERY    . NECK SURGERY      Family Psychiatric History: I have reviewed the patient's family history in detail and updated the patient record.  Family History:  Family History  Problem Relation Age of Onset  . Alcohol abuse Father   . Alcohol abuse Brother   . Depression Mother   . Alcohol abuse Mother     Social History:  Social History   Socioeconomic History  . Marital status: Single    Spouse name: Not on file  . Number of children: Not on file  . Years of education: Not on  file  . Highest education level: Not on file  Social Needs  . Financial resource strain: Not on file  . Food insecurity - worry: Not on file  . Food insecurity - inability: Not on file  . Transportation needs - medical: Not on file  . Transportation needs - non-medical: Not on file  Occupational History  . Not on file  Tobacco Use  . Smoking status: Former Games developermoker  . Smokeless tobacco: Never Used  . Tobacco comment: No smoking hx; no need for cessation materials  Substance and Sexual Activity  . Alcohol use: No    Alcohol/week: 0.0 oz    Comment: Occasional use  . Drug use: No  . Sexual activity: No  Other Topics Concern  . Not on file  Social History Narrative  . Not on file    Allergies: No Known Allergies  Metabolic Disorder Labs: Lab Results  Component Value Date   HGBA1C 4.7 (L) 10/23/2017   MPG 88.19 10/23/2017   MPG 88 02/03/2017   Lab Results  Component Value Date   PROLACTIN 121.9 (H) 02/03/2017   Lab Results  Component Value Date   CHOL 181 10/23/2017   TRIG 56 10/23/2017   HDL 46 10/23/2017   CHOLHDL 3.9 10/23/2017   VLDL 11 10/23/2017   LDLCALC 124 (H) 10/23/2017   LDLCALC 149 (H) 02/03/2017   Lab Results  Component Value Date   TSH 5.474 (H) 02/03/2017   TSH 4.924 (H) 06/11/2015    Therapeutic Level Labs: Lab Results  Component Value Date  LITHIUM 0.88 02/13/2017   LITHIUM 0.57 (L) 02/03/2017   No results found for: VALPROATE No components found for:  CBMZ  Current Medications: No current facility-administered medications for this visit.    Current Outpatient Medications  Medication Sig Dispense Refill  . busPIRone (BUSPAR) 10 MG tablet Take 1 tablet (10 mg total) 3 (three) times daily by mouth. 90 tablet 0  . [START ON 11/01/2017] DULoxetine (CYMBALTA) 60 MG capsule Take 1 capsule (60 mg total) daily by mouth. 30 capsule 0  . QUEtiapine (SEROQUEL) 25 MG tablet Take 1 tablet (25 mg total) 2 (two) times daily by mouth. 60 tablet 0  .  traZODone (DESYREL) 100 MG tablet Take 2 tablets (200 mg total) at bedtime by mouth. 60 tablet 0   Facility-Administered Medications Ordered in Other Visits  Medication Dose Route Frequency Provider Last Rate Last Dose  . acetaminophen (TYLENOL) tablet 650 mg  650 mg Oral Q6H PRN Clapacs, Jackquline Denmark, MD   650 mg at 10/28/17 2153  . alum & mag hydroxide-simeth (MAALOX/MYLANTA) 200-200-20 MG/5ML suspension 30 mL  30 mL Oral Q4H PRN Clapacs, John T, MD      . busPIRone (BUSPAR) tablet 10 mg  10 mg Oral TID Clapacs, Jackquline Denmark, MD   10 mg at 10/31/17 1209  . DULoxetine (CYMBALTA) DR capsule 60 mg  60 mg Oral Daily Clapacs, Jackquline Denmark, MD   60 mg at 10/31/17 1209  . famotidine (PEPCID) tablet 20 mg  20 mg Oral BID Clapacs, Jackquline Denmark, MD   20 mg at 10/31/17 1208  . fentaNYL (SUBLIMAZE) injection 25 mcg  25 mcg Intravenous Q5 min PRN Yves Dill, MD      . magnesium hydroxide (MILK OF MAGNESIA) suspension 30 mL  30 mL Oral Daily PRN Clapacs, John T, MD      . QUEtiapine (SEROQUEL) tablet 25 mg  25 mg Oral BID Clapacs, Jackquline Denmark, MD   25 mg at 10/31/17 1210  . traZODone (DESYREL) tablet 200 mg  200 mg Oral QHS Beverly Sessions, MD   200 mg at 10/30/17 2212     Musculoskeletal: Strength & Muscle Tone: decreased Gait & Station: in a wheelchair Patient leans: N/A  Psychiatric Specialty Exam: ROS  Last menstrual period 10/24/2017.There is no height or weight on file to calculate BMI.  General Appearance: Fairly Groomed  Eye Contact:  Good  Speech:  Clear and Coherent  Volume:  Normal  Mood:  {[facility] MOOD:22306}  Affect:  {Affect (PAA):22687}  Thought Process:  Coherent and Goal Directed  Orientation:  Full (Time, Place, and Person)  Thought Content: Logical   Suicidal Thoughts:  {ST/HT (PAA):22692}  Homicidal Thoughts:  {ST/HT (PAA):22692}  Memory:  Immediate;   Good Recent;   Good Remote;   Good  Judgement:  {Judgement (PAA):22694}  Insight:  {Insight (PAA):22695}  Psychomotor Activity:  Normal   Concentration:  Concentration: Good and Attention Span: Good  Recall:  Good  Fund of Knowledge: Good  Language: Good  Akathisia:  No  Handed:  Right  AIMS (if indicated): not done  Assets:  Communication Skills Desire for Improvement  ADL's:  Intact  Cognition: WNL  Sleep:  {[facility] GOOD/FAIR/POOR:22877}   Screenings: AIMS     [facility]) [facility] Ad[facility] (Discharged) from OP Visit [facility] Admission (Discharged) from OP [facility] Admission (Discharged) from OP Visit [facility]2015  [facility]  AIMS Total Score  0  0  0  0    AUDIT     [facility] Admission (Discharged) from OP [facility] Admission (Discharged) from OP Visit [facility] Admission (Discharged) from OP [facility]  Alcohol Use Disorder Identification Test Final Score (AUDIT)  0  0  1  1    ECT-MADRS     [facility]  MADRS Total Score  43    GAD-7     Counselor from 06/24/2016 in [facility] [place]  Total GAD-7 Score  15    Mini-Mental     Admission [facility]Current) [facility]  Total Score (max 30 points )  30    PHQ2-9     Counselor from 06/24/2016 in [facility] OUTPAT[facility]ERAP[facility] [place] Counselor from 11/04/2015 in [facility] OUTPAT[facility]AP[facility] [place] Counselor from 06/27/2015 in [facility] INTENSIVE PSYCH  PHQ-2 Total Score  2  6  6   PHQ-9 Total Score  7  16  26        Assessment and Plan:  Natasha [name] is a  46 y.o. year old female with a history of depression, significant history of past suicide attempts (last in 2002), s/p C6 spinal cord injury  , who presents for follow up appointment for No diagnosis found.  # MDD, severe without psychotic features  Exam is notable for less restricted affect, although patient continues to endorse neurovegetative symptoms. Will switch from lexapro to duloxetine to target depression. Discussed risk of serotonin syndrome. Will continue quetiapine as adjunctive treatment for depression, insomnia. Will continue Buspar for anxiety, trazodone prn for insomnia. Will continue clonazepam prn for anxiety. Explored her value and dicussed the way she can take the smallest action to step toward her value. She would try searching a job and takes a walk regularly. Discussed cognitive defusion. She will continue ECT and therapy. Although she will greatly benefit from group therapy, she is unable to do it given limited resource for transportation.   Plan 1. Decrease lexapro 20 mg daily for one week, then 10 mg daily for one week, then discontinue 2. Start duloxetine 30 mg daily for one week, then 60 mg daily  3. Continue Buspar 10 mg three times a day 4. Continue quetiapine 25 mg twice a day 5. Continue trazodone 100 mg at night 6. Continue clonazepam 0.5 mg twice a day for anxiety 7. Return to clinic in one month for 30 mins  8. Emergency resources which includes 911, ED, suicide crisis line [phone]) are discussed.  The patient demonstrates the following risk factors for suicide: Chronic risk factors for suicide include: psychiatric disorder of depression, previous suicide attempts of jumping from the balcony, overdosing medication, previous self-harm cutting, medical illness of spinal cord injuryand completed suicide in a family member. Acute risk factorsfor suicide include: unemployment, Estate agentsocial withdrawal/isolation and loss (financial, interpersonal, professional).  Protective factorsfor this patient include: coping skills and hope for the future. Considering these factors, the overall suicide risk at this point appears to be moderate, but not at imminent danger to self. Patient isappropriate for outpatient follow up.     Neysa Hottereina Gordan Grell, MD 10/31/2017, 3:55 PM

## 2017-10-31 NOTE — [facility] Group Notes (Signed)
[facility] Group Notes:  (Nursing/MHT/Case Management/Adjunct)  Date:  10/31/2017  Time:  3:18 PM  Type of Therapy:  Psychoeducational Skills  Participation Level:  Active  Participation Quality:  Appropriate, Attentive, Sharing and Supportive  Affect:  Appropriate and Flat  Cognitive:  Alert, Appropriate and Oriented  Insight:  Appropriate  Engagement in Group:  Engaged and Supportive  Modes of Intervention:  Discussion and Education  Summary of Progress/Problems:  [name] 10/31/2017, 3:18 PM

## 2017-11-01 NOTE — Progress Notes (Signed)
Recreation Therapy Notes  INPATIENT RECREATION TR PLAN  Patient Details Name: Natasha [name] MRN: [id number] DOB: Aug 28, 1971 Today's Date: 11/01/2017  Rec Therapy Plan Is patient appropriate for Therapeutic Recreation?: Yes Treatment times per week: at least 3  Estimated Length of Stay: 5-7 days TR Treatment/Interventions: Group participation (Comment)(Appropriate participation in recreation therapy tx.)  Discharge Criteria Pt will be discharged from therapy if:: Discharged Treatment plan/goals/alternatives discussed and agreed upon by:: Patient/family  Discharge Summary Short term goals set: Patient will engage in interactions with peers and staff in a pro-social manner x5 days.  Short term goals met: Complete Progress toward goals comments: Groups attended Which groups?: Coping skills Reason goals not met: N/A Therapeutic equipment acquired: N/A Reason patient discharged from therapy: Discharge from hospital Pt/family agrees with progress & goals achieved: Yes Date patient discharged from therapy: 11/01/17   Natasha [name] 11/01/2017, 3:26 PM

## 2017-11-01 NOTE — Progress Notes (Signed)
Patient ID: [name], female   DOB: 03/12/1971, 46 y.o.   MRN: [id number]  Received [place] this am after breakfast, she received her medications. She denied all of the psychiatric symptoms and feels safe to be discharge home today. Her friend will come to pick her up when she is ready. She is OOB in the milieu for her meal and socializing with select peers. She received her discharge order, the AVS was reviewed and her questions answered. Her personal belongings returned and valuables. She was discharged with her friend without incident via car.

## 2017-11-01 NOTE — Progress Notes (Signed)
Patient ID: Natasha [name], female   DOB: [birth year], 46 y.o.   MRN: [id number] Pleasant on approach in the day room, "I am going to be discharged tomorrow morning at 10:30, my friend Natasha [name] is picking me up; [facility] here under Dr. Toni Amendlapacs' care as outpatient ECT; rough night last night, I did not go for the ECT..." Denied pain, mixed mood, slightly pale looking and worn out; anticipating discharge.

## 2017-11-01 NOTE — Plan of Care (Signed)
Patient slept for Estimated Hours of 6.15; Precautionary checks every 15 minutes for safety maintained, room free of safety hazards, patient sustains no injury or falls during this shift.  

## 2017-11-01 NOTE — Progress Notes (Signed)
  [facility] Discharge [facility] :  Will you be returning to the same living situation after discharge:  Yes,  own home At discharge, do you have transportation home?: Yes,  friend Do you have the ability to pay for your medications: Yes,  medicare  Release of information consent forms completed and in the chart;  Patient's signature needed at discharge.  Patient to Follow up at: Follow-up Information    [facility] Follow up on 11/02/2017.   Specialty:  Behavioral Health Why:  Patient current w Dr Vanetta ShawlHisada for medications management, next appointment is Wednesday, 11/02/17 at 9:15 AM.   Contact information: [address] [place] [id number] [phone]       [facility]-ECT THERAPY. Go on 11/09/2017.   Why:  Please attend your ECT appointment on Wednesday, 11/09/17.  Outpatient ECT staff will contact you to give you details on the time of your appointment time. Contact information: [address] [id number] [place] [id number] [phone]          Next level of care provider has access to [facility] Link:yes  Safety Planning and Suicide Prevention discussed: Yes,  friend     Has patient been referred to the Quitline?: N/A patient is not a smoker  Patient has been referred for addiction treatment: Yes  Natasha [name], Natasha Willet Jon, LCSW 11/01/2017, 10:32 AM

## 2017-11-02 ENCOUNTER — Ambulatory Visit (HOSPITAL_COMMUNITY): Payer: Self-pay | Admitting: Psychiatry

## 2017-11-04 LAB — STOOL CULTURE: E coli, Shiga toxin Assay: NEGATIVE

## 2017-11-04 LAB — STOOL CULTURE REFLEX - CMPCXR

## 2017-11-04 LAB — STOOL CULTURE REFLEX - RSASHR

## 2017-11-07 ENCOUNTER — Telehealth: Payer: Self-pay

## 2017-11-08 ENCOUNTER — Telehealth (HOSPITAL_COMMUNITY): Payer: Self-pay | Admitting: *Deleted

## 2017-11-08 NOTE — Telephone Encounter (Signed)
Chart reviewed for insurance information to call for preauthorization of ECT. Patient has Medicare A and B therefore no authorization is required. Called office staff to make them aware and to ask if other insurance is known to make this writer aware as the only one listed is Medicare.

## 2017-11-08 NOTE — Progress Notes (Deleted)
BH MD/PA/NP OP Progress Note  11/08/2017 2:31 PM Natasha [name]  MRN:  [id number]  Chief Complaint:  HPI:  - Since the last appointment, [facility] and SI; [facility].     Visit Diagnosis: No diagnosis found.  Past Psychiatric History:  I have reviewed the patient's psychiatry history in detail and updated the patient record. Outpatient: She reports depression since 1993, since her friend deceased. She reports doing well without medication 2002-2009. She used to be seen by Dr. Lolly MustacheArfeen, last in 03/2017, diagnosed with MDD without psychotic features. Sees Dr. Minus LibertyGligorovic for ECT Psychiatry admission: [facility] [place] in September for depression, SI Previous suicide attempt: "more than I can count", overdose on medication, cutting her wrist, last in 2002 by [facility] the parking [facility], which causedspinal injury at C6/7 Past trials of medication: Lexapro, Prozac, Paxil, Zoloft, Effexor, Wellbutrin, Remeron, Depakote, Tegretol, lithium, trazodone, Geodon, Risperdal, olanzapine, Abilify History of violence: denies    Past Medical History:  Past Medical History:  Diagnosis Date  . C6 spinal cord injury (HCC)   . Depression     Past Surgical History:  Procedure Laterality Date  . BACK SURGERY    . NECK SURGERY      Family Psychiatric History: I have reviewed the patient's family history in detail and updated the patient record. Great grandmother- attempted suicide, cousin- attempted suicide, brother- substance use, father- substance use    Family History:  Family History  Problem Relation Age of Onset  . Alcohol abuse Father   . Alcohol abuse Brother   . Depression Mother   . Alcohol abuse Mother     Social History:  Social History   Socioeconomic History  . Marital status: Single    Spouse name: Not on  file  . Number of children: Not on file  . Years of education: Not on file  . Highest education level: Not on file  Social Needs  . Financial resource strain: Not on file  . Food insecurity - worry: Not on file  . Food insecurity - inability: Not on file  . Transportation needs - medical: Not on file  . Transportation needs - non-medical: Not on file  Occupational History  . Not on file  Tobacco Use  . Smoking status: Former Games developermoker  . Smokeless tobacco: Never Used  . Tobacco comment: No smoking hx; no need for cessation materials  Substance and Sexual Activity  . Alcohol use: No    Alcohol/week: 0.0 oz    Comment: Occasional use  . Drug use: No  . Sexual activity: No  Other Topics Concern  . Not on file  Social History Narrative  . Not on file    Allergies: No Known Allergies  Metabolic Disorder Labs: Lab Results  Component Value Date   HGBA1C 4.7 (L) 10/23/2017   MPG 88.19 10/23/2017   MPG 88 02/03/2017   Lab Results  Component Value Date   PROLACTIN 121.9 (H) 02/03/2017   Lab Results  Component Value Date   CHOL 181 10/23/2017   TRIG 56 10/23/2017   HDL 46 10/23/2017   CHOLHDL 3.9 10/23/2017   VLDL 11 10/23/2017   LDLCALC 124 (H) 10/23/2017   LDLCALC 149 (H) 02/03/2017   Lab Results  Component Value Date   TSH 5.474 (H) 02/03/2017   TSH 4.924 (H) 06/11/2015    Therapeutic  Level Labs: Lab Results  Component Value Date   LITHIUM 0.88 02/13/2017   LITHIUM 0.57 (L) 02/03/2017   No results found for: VALPROATE No components found for:  CBMZ  Current Medications: Current Outpatient Medications  Medication Sig Dispense Refill  . busPIRone (BUSPAR) 10 MG tablet Take 1 tablet (10 mg total) 3 (three) times daily by mouth. 90 tablet 0  . clonazePAM (KLONOPIN) 0.5 MG tablet Take 1 tablet (0.5 mg total) 2 (two) times daily as needed by mouth (anxiety). 30 tablet 0  . DULoxetine (CYMBALTA) 60 MG capsule Take 1 capsule (60 mg total) daily by mouth. 30 capsule  0  . QUEtiapine (SEROQUEL) 25 MG tablet Take 1 tablet (25 mg total) 2 (two) times daily by mouth. 60 tablet 0  . traZODone (DESYREL) 100 MG tablet Take 2 tablets (200 mg total) at bedtime by mouth. 60 tablet 0   No current facility-administered medications for this visit.      Musculoskeletal: Strength & Muscle Tone: within normal limits Gait & Station: normal Patient leans: N/A  Psychiatric Specialty Exam: ROS  Last menstrual period 10/24/2017.There is no height or weight on file to calculate BMI.  General Appearance: Fairly Groomed  Eye Contact:  Good  Speech:  Clear and Coherent  Volume:  Normal  Mood:  {BHH MOOD:22306}  Affect:  {Affect (PAA):22687}  Thought Process:  Coherent and Goal Directed  Orientation:  Full (Time, Place, and Person)  Thought Content: Logical   Suicidal Thoughts:  {ST/HT (PAA):22692}  Homicidal Thoughts:  {ST/HT (PAA):22692}  Memory:  Immediate;   Good Recent;   Good Remote;   Good  Judgement:  {Judgement (PAA):22694}  Insight:  {Insight (PAA):22695}  Psychomotor Activity:  Normal  Concentration:  Concentration: Good and Attention Span: Good  Recall:  Good  Fund of Knowledge: Good  Language: Good  Akathisia:  No  Handed:  Right  AIMS (if indicated): not done  Assets:  Communication Skills Desire for Improvement  ADL's:  Intact  Cognition: WNL  Sleep:  {[facility] GOOD/FAIR/POOR:22877}   Screenings: AIMS     [facility]) [facility] Ad[facility] (Discharged) from OP [facility] Admission (Discharged) from OP [facility] Admission (Discharged) from OP [facility]  AIMS Total Score  0  0  0  0    AUDIT     [facility] Admission (Discharged) from OP Visit  [facility] Admission (Discharged) from OP Visit [facility] Admission (Discharged) from OP [facility]  Alcohol Use Disorder Identification Test Final Score (AUDIT)  0  0  1  1    ECT-MADRS     Ad[facility]Dis[facility]  MADRS Total Score  43    GAD-7     Counselor from 06/24/2016 in BEHAVIORAL HEALTH OUTPATIENT THERAPY [place]  Total GAD-7 Score  15    Mini-Mental     Admission (Discharged) from [facility]2017 [facility]  Total Score (max 30 points )  30    PHQ2-9     Counselor from 06/24/2016 in BEHAVIORAL HEALTH OUTPATIENT THERAPY [place] Counselor from 11/04/2015 in BEHAVIORAL HEALTH OUTPATIENT THERAPY [place] Counselor from 06/27/2015  in BEHAVIORAL HEALTH INTENSIVE PSYCH  PHQ-2 Total Score  2  6  6   PHQ-9 Total Score  7  16  26        Assessment and Plan:  Natasha [name] is a 46 y.o. year old female with a history of depression,  significant history of past suicide attempts (last in 2002), s/p C6 spinal cord injury , who presents for follow up appointment for No diagnosis found.  # MDD, severe without psychotic features  Exam is notable for less restricted affect, although patient continues to endorse neurovegetative symptoms. Will switch from lexapro to duloxetine to target depression. Discussed risk of serotonin syndrome. Will continue quetiapine as adjunctive treatment for depression, insomnia. Will continue Buspar for anxiety, trazodone prn for insomnia. Will continue clonazepam prn for anxiety. Explored her value and dicussed the way she can take the smallest action to step toward her value. She would try searching a job and takes a walk regularly. Discussed cognitive defusion. She will continue ECT and therapy. Although she will  greatly benefit from group therapy, she is unable to do it given limited resource for transportation.   Plan 1. Decrease lexapro 20 mg daily for one week, then 10 mg daily for one week, then discontinue 2. Start duloxetine 30 mg daily for one week, then 60 mg daily  3. Continue Buspar 10 mg three times a day 4. Continue quetiapine 25 mg twice a day 5. Continue trazodone 100 mg at night 6. Continue clonazepam 0.5 mg twice a day for anxiety 7. Return to clinic in one month for 30 mins  8. Emergency resources which includes 911, ED, suicide crisis line [phone]) are discussed.  The patient demonstrates the following risk factors for suicide: Chronic risk factors for suicide include: psychiatric disorder of depression, previous suicide attempts of jumping from the balcony, overdosing medication, previous self-harm cutting, medical illness of spinal cord injuryand completed suicide in a family member. Acute risk factorsfor suicide include: unemployment, Estate agent and loss (financial, interpersonal, professional). Protective factorsfor this patient include: coping skills and hope for the future. Considering these factors, the overall suicide risk at this point appears to be moderate, but not at imminent danger to self. Patient isappropriate for outpatient follow up.     Neysa Hotter, MD 11/08/2017, 2:31 PM

## 2017-11-14 ENCOUNTER — Ambulatory Visit (HOSPITAL_COMMUNITY): Payer: Self-pay | Admitting: Psychiatry

## 2017-11-14 ENCOUNTER — Telehealth: Payer: Self-pay | Admitting: *Deleted

## 2017-11-15 ENCOUNTER — Other Ambulatory Visit: Payer: Self-pay | Admitting: Psychiatry

## 2017-11-16 ENCOUNTER — Encounter: Payer: Self-pay | Admitting: Anesthesiology

## 2017-11-16 ENCOUNTER — Other Ambulatory Visit: Payer: Self-pay

## 2017-11-16 ENCOUNTER — Encounter (HOSPITAL_COMMUNITY): Payer: Self-pay | Admitting: Emergency Medicine

## 2017-11-16 ENCOUNTER — Encounter
Admission: RE | Admit: 2017-11-16 | Discharge: 2017-11-16 | Disposition: A | Discharge status: Home / Self Care | Payer: Medicare Other | Source: Ambulatory Visit | Attending: Psychiatry | Admitting: Psychiatry

## 2017-11-16 ENCOUNTER — Emergency Department (HOSPITAL_COMMUNITY)
Admission: EM | Admit: 2017-11-16 | Discharge: 2017-11-16 | Disposition: A | Discharge status: Home / Self Care | Payer: Medicare Other | Attending: Emergency Medicine | Admitting: Emergency Medicine

## 2017-11-16 DIAGNOSIS — Z87891 Personal history of nicotine dependence: Secondary | ICD-10-CM | POA: Insufficient documentation

## 2017-11-16 DIAGNOSIS — G43809 Other migraine, not intractable, without status migrainosus: Secondary | ICD-10-CM

## 2017-11-16 DIAGNOSIS — F332 Major depressive disorder, recurrent severe without psychotic features: Secondary | ICD-10-CM

## 2017-11-16 DIAGNOSIS — F339 Major depressive disorder, recurrent, unspecified: Secondary | ICD-10-CM | POA: Diagnosis not present

## 2017-11-16 DIAGNOSIS — S14106S Unspecified injury at C6 level of cervical spinal cord, sequela: Secondary | ICD-10-CM | POA: Insufficient documentation

## 2017-11-16 DIAGNOSIS — F419 Anxiety disorder, unspecified: Secondary | ICD-10-CM | POA: Diagnosis not present

## 2017-11-16 DIAGNOSIS — F333 Major depressive disorder, recurrent, severe with psychotic symptoms: Secondary | ICD-10-CM | POA: Diagnosis not present

## 2017-11-16 DIAGNOSIS — R51 Headache: Secondary | ICD-10-CM | POA: Diagnosis present

## 2017-11-16 LAB — POCT PREGNANCY, URINE: Preg Test, Ur: NEGATIVE

## 2017-11-16 MED ORDER — SODIUM CHLORIDE 0.9 % IV SOLN
INTRAVENOUS | Status: DC | PRN
  Administered 2017-11-16: 09:00:00 via INTRAVENOUS

## 2017-11-16 MED ORDER — KETOROLAC TROMETHAMINE 30 MG/ML IJ SOLN: 30.0000 mg | Freq: Once | INTRAMUSCULAR | Status: DC

## 2017-11-16 MED ORDER — SODIUM CHLORIDE 0.9 % IV SOLN: 500.0000 mL | Freq: Once | INTRAVENOUS | Status: DC

## 2017-11-16 MED ORDER — SODIUM CHLORIDE 0.9 % IV BOLUS (SEPSIS)
1000.0000 mL | Freq: Once | INTRAVENOUS | Status: AC
  Administered 2017-11-16: 1000 mL via INTRAVENOUS

## 2017-11-16 MED ORDER — PROCHLORPERAZINE EDISYLATE 5 MG/ML IJ SOLN
10.0000 mg | Freq: Once | INTRAMUSCULAR | Status: AC
  Administered 2017-11-16: 10 mg via INTRAVENOUS
  Filled 2017-11-16: qty 2

## 2017-11-16 MED ORDER — GLYCOPYRROLATE 0.2 MG/ML IJ SOLN: 0.1000 mg | Freq: Once | INTRAMUSCULAR | Status: DC

## 2017-11-16 MED ORDER — METHOHEXITAL SODIUM 100 MG/10ML IV SOSY
PREFILLED_SYRINGE | INTRAVENOUS | Status: DC | PRN
  Administered 2017-11-16: 50 mg via INTRAVENOUS

## 2017-11-16 MED ORDER — SUCCINYLCHOLINE CHLORIDE 200 MG/10ML IV SOSY
PREFILLED_SYRINGE | INTRAVENOUS | Status: DC | PRN
  Administered 2017-11-16: 60 mg via INTRAVENOUS

## 2017-11-16 MED ORDER — KETOROLAC TROMETHAMINE 30 MG/ML IJ SOLN
30.0000 mg | Freq: Once | INTRAMUSCULAR | Status: AC
  Administered 2017-11-16: 30 mg via INTRAVENOUS
  Filled 2017-11-16: qty 1

## 2017-11-16 MED ORDER — DIPHENHYDRAMINE HCL 50 MG/ML IJ SOLN
12.5000 mg | Freq: Once | INTRAMUSCULAR | Status: AC
  Administered 2017-11-16: 12.5 mg via INTRAVENOUS
  Filled 2017-11-16: qty 1

## 2017-11-16 NOTE — Transfer of Care (Signed)
Immediate Anesthesia Transfer of Care Note  Patient: Natasha [name]  Procedure(s) Performed: ECT TX  Patient Location: PACU  Anesthesia Type:General  Level of Consciousness: sedated  Airway & Oxygen Therapy: Patient Spontanous Breathing and Patient connected to face mask oxygen  Post-op Assessment: Report given to RN and Post -op Vital signs reviewed and stable  Post vital signs: Reviewed and stable  Last Vitals:  Vitals:   11/16/17 0913  BP: 119/70  Pulse: 64  Resp: 17  Temp: (!) 36 C  SpO2: 100%    Last Pain:  Vitals:   11/16/17 0913  TempSrc: Oral  PainSc: 4          Complications: No apparent anesthesia complications

## 2017-11-16 NOTE — Anesthesia Preprocedure Evaluation (Addendum)
Anesthesia Evaluation  Patient identified by MRN, date of birth, ID band Patient awake    Reviewed: Allergy & Precautions, NPO status , Patient's Chart, lab work & pertinent test results  Airway Mallampati: II       Dental  (+) Teeth Intact   Pulmonary neg pulmonary ROS, former smoker,    breath sounds clear to auscultation       Cardiovascular Exercise Tolerance: Good  Rhythm:Regular Rate:Normal     Neuro/Psych Anxiety Depression negative neurological ROS     GI/Hepatic negative GI ROS, Neg liver ROS,   Endo/Other    Renal/GU negative Renal ROS  negative genitourinary   Musculoskeletal negative musculoskeletal ROS (+)   Abdominal Normal abdominal exam  (+)   Peds negative pediatric ROS (+)  Hematology negative hematology ROS (+)   Anesthesia Other Findings   Reproductive/Obstetrics                            Anesthesia Physical Anesthesia Plan  ASA: II  Anesthesia Plan: General   Post-op Pain Management:    Induction: Intravenous  PONV Risk Score and Plan: 0  Airway Management Planned: Simple Face Mask  Additional Equipment:   Intra-op Plan:   Post-operative Plan:   Informed Consent: I have reviewed the patients History and Physical, chart, labs and discussed the procedure including the risks, benefits and alternatives for the proposed anesthesia with the patient or authorized representative who has indicated his/her understanding and acceptance.     Plan Discussed with: CRNA  Anesthesia Plan Comments:         Anesthesia Quick Evaluation

## 2017-11-16 NOTE — Anesthesia Procedure Notes (Signed)
Date/Time: 11/16/2017 11:15 AM Performed by: Lily KocherPeralta, Skyelar Halliday, CRNA Pre-anesthesia Checklist: Patient identified, Emergency Drugs available, Suction available and Patient being monitored Patient Re-evaluated:Patient Re-evaluated prior to induction Oxygen Delivery Method: Circle system utilized Preoxygenation: Pre-oxygenation with 100% oxygen Induction Type: IV induction Ventilation: Mask ventilation without difficulty and Mask ventilation throughout procedure Airway Equipment and Method: Bite block Placement Confirmation: positive ETCO2 Dental Injury: Teeth and Oropharynx as per pre-operative assessment

## 2017-11-16 NOTE — H&P (Signed)
Angelmarie Lubertha SouthKatherine Schlotzhauer is an 46 y.o. female.   Chief Complaint: Patient reports that her mood remains good.  Not suicidal.  No physical complaint HPI: History of recurrent severe depression that has responded well to ECT continuing maintenance treatment  Past Medical History:  Diagnosis Date  . C6 spinal cord injury (HCC)   . Depression     Past Surgical History:  Procedure Laterality Date  . BACK SURGERY    . NECK SURGERY      Family History  Problem Relation Age of Onset  . Alcohol abuse Father   . Alcohol abuse Brother   . Depression Mother   . Alcohol abuse Mother    Social History:  reports that she has quit smoking. she has never used smokeless tobacco. She reports that she does not drink alcohol or use drugs.  Allergies: No Known Allergies   (Not in a hospital admission)  Results for orders placed or performed during the hospital encounter of 11/16/17 (from the past 48 hour(s))  Pregnancy, urine POC     Status: None   Collection Time: 11/16/17  9:45 AM  Result Value Ref Range   Preg Test, Ur NEGATIVE NEGATIVE    Comment:        THE SENSITIVITY OF THIS METHODOLOGY IS >24 mIU/mL    No results found.  Review of Systems  Constitutional: Negative.   HENT: Negative.   Eyes: Negative.   Respiratory: Negative.   Cardiovascular: Negative.   Gastrointestinal: Negative.   Musculoskeletal: Negative.   Skin: Negative.   Neurological: Negative.   Psychiatric/Behavioral: Negative.     Blood pressure 119/70, pulse 64, temperature (!) 96.8 F (36 C), temperature source Oral, resp. rate 17, height 5' (1.524 m), weight 122 lb (55.3 kg), last menstrual period 10/24/2017, SpO2 100 %. Physical Exam  Nursing note and vitals reviewed. Constitutional: She appears well-developed and well-nourished.  HENT:  Head: Normocephalic and atraumatic.  Eyes: Conjunctivae are normal. Pupils are equal, round, and reactive to light.  Neck: Normal range of motion.  Cardiovascular:  Regular rhythm and normal heart sounds.  Respiratory: Effort normal and breath sounds normal. No respiratory distress.  GI: Soft.  Musculoskeletal: Normal range of motion.  Neurological: She is alert.  Skin: Skin is warm and dry.  Psychiatric: She has a normal mood and affect. Her behavior is normal. Judgment and thought content normal.     Assessment/Plan Treatment today follow-up 2 weeks.  Mordecai RasmussenJohn Clapacs, MD 11/16/2017, 11:07 AM

## 2017-11-16 NOTE — ED Provider Notes (Signed)
[facility] EMERGENCY DEPARTMENT Provider Note   CSN: [id number] Arrival date & time: 11/16/17  1737     History   Chief Complaint Chief Complaint  Patient presents with  . Headache    HPI Natasha [name] is a 46 y.o. female.  HPI Patient presents to the emergency room for evaluation of a migraine headache.  Patient has a history of depression.  She was treated with ECT this morning.  She developed a headache that was consistent with her migraine headaches after the treatment this morning.  The patient is having nausea and light sensitivity.  She is also having sensitivity to loud noises.  This feels typical for her migraine headaches.  She denies any numbness or weakness.  No fevers or chills.  No vomiting or diarrhea Past Medical History:  Diagnosis Date  . C6 spinal cord injury (HCC)   . Depression     Patient Active Problem List   Diagnosis Date Noted  . Severe recurrent major depression without psychotic features (HCC) 10/21/2017  . Paraplegia (HCC) 10/21/2017  . GAD (generalized anxiety disorder) 10/20/2017  . MDD (major depressive disorder), recurrent episode, severe (HCC) 10/18/2017  . Major depressive disorder, recurrent, severe without psychotic features [facility](HCC)     Past Surgical History:  Procedure Laterality Date  . BACK SURGERY    . NECK SURGERY      OB History    No data available       Home Medications    Prior to Admission medications   Medication Sig Start Date End Date Taking? Authorizing Provider  busPIRone (BUSPAR) 10 MG tablet Take 1 tablet (10 mg total) 3 (three) times daily by mouth. 10/31/17   Clapacs, Jackquline DenmarkJohn T, MD  clonazePAM (KLONOPIN) 0.5 MG tablet Take 1 tablet (0.5 mg total) 2 (two) times daily as needed by mouth (anxiety). 10/21/17   Money, Gerlene Burdockravis B, FNP  DULoxetine (CYMBALTA) 60 MG capsule Take 1 capsule (60 mg total) daily by mouth. 11/01/17   Clapacs, Jackquline DenmarkJohn T, MD  QUEtiapine (SEROQUEL) 25 MG tablet Take 1 tablet (25 mg total) 2  (two) times daily by mouth. 10/31/17   Clapacs, Jackquline DenmarkJohn T, MD  traZODone (DESYREL) 100 MG tablet Take 2 tablets (200 mg total) at bedtime by mouth. 10/31/17   Clapacs, Jackquline DenmarkJohn T, MD    Family History Family History  Problem Relation Age of Onset  . Alcohol abuse Father   . Alcohol abuse Brother   . Depression Mother   . Alcohol abuse Mother     Social History Social History   Tobacco Use  . Smoking status: Former Games developermoker  . Smokeless tobacco: Never Used  . Tobacco comment: No smoking hx; no need for cessation materials  Substance Use Topics  . Alcohol use: No    Alcohol/week: 0.0 oz    Comment: Occasional use  . Drug use: No     Allergies   Patient has no known allergies.   Review of Systems Review of Systems  All other systems reviewed and are negative.    Physical Exam Updated Vital Signs BP 116/64 (BP Location: Left Arm)   Pulse 73   Temp 98.1 F (36.7 C) (Temporal)   Resp 16   Ht 1.524 m (5')   Wt 54.4 kg (120 lb)   LMP 10/24/2017 (Exact Date)   SpO2 100%   BMI 23.44 kg/m   Physical Exam  Constitutional: She appears well-developed and well-nourished. No distress.  HENT:  Head: Normocephalic and atraumatic.  Right  Ear: External ear normal.  Left Ear: External ear normal.  Eyes: Conjunctivae are normal. Right eye exhibits no discharge. Left eye exhibits no discharge. No scleral icterus.  Neck: Neck supple. No tracheal deviation present.  Cardiovascular: Normal rate, regular rhythm and intact distal pulses.  Pulmonary/Chest: Effort normal and breath sounds normal. No stridor. No respiratory distress. She has no wheezes. She has no rales.  Abdominal: Soft. Bowel sounds are normal. She exhibits no distension. There is no tenderness. There is no rebound and no guarding.  Musculoskeletal: She exhibits no edema or tenderness.  Neurological: She is alert. She has normal strength. No cranial nerve deficit (no facial droop, extraocular movements intact, no slurred  speech) or sensory deficit. She exhibits normal muscle tone. She displays no seizure activity. Coordination normal.  Skin: Skin is warm and dry. No rash noted.  Psychiatric: She has a normal mood and affect.  Nursing note and vitals reviewed.    ED Treatments / Results  Labs (all labs ordered are listed, but only abnormal results are displayed) Labs Reviewed - No data to display  EKG  EKG Interpretation None       Radiology No results found.  Procedures Procedures (including critical care time)  Medications Ordered in ED Medications  ketorolac (TORADOL) 30 MG/ML injection 30 mg (30 mg Intravenous Given 11/16/17 1823)  prochlorperazine (COMPAZINE) injection 10 mg (10 mg Intravenous Given 11/16/17 1823)  diphenhydrAMINE (BENADRYL) injection 12.5 mg (12.5 mg Intravenous Given 11/16/17 1823)  sodium chloride 0.9 % bolus 1,000 mL (0 mLs Intravenous Stopped 11/16/17 1926)     Initial Impression / Assessment and Plan / ED Course  I have reviewed the triage vital signs and the nursing notes.  Pertinent labs & imaging results that were available during my care of the patient were reviewed by me and considered in my medical decision making (see chart for details).  Clinical Course as of Nov 17 2019  Wed Nov 16, 2017  2020 Patient states she is feeling much better.  Her symptoms have resolved  [JK]    Clinical Course User Index [JK] Linwood DibblesKnapp, Lou Loewe, MD    Patient presented to the emergency room with complaints of a migraine headache.  Her symptoms were typical for her prior migraine headaches.  She responded to migraine cocktail.  Patient is feeling better now and ready to go home.  Final Clinical Impressions(s) / ED Diagnoses   Final diagnoses:  Other migraine without status migrainosus, not intractable    ED Discharge Orders    None       Linwood DibblesKnapp, Blakeley Scheier, MD 11/16/17 2021

## 2017-11-16 NOTE — Anesthesia Postprocedure Evaluation (Signed)
Anesthesia Post Note  Patient: Natasha SouthwardJenifer [name]  Procedure(s) Performed: ECT TX  Patient location during evaluation: PACU Anesthesia Type: General Level of consciousness: awake Pain management: pain level controlled Vital Signs Assessment: post-procedure vital signs reviewed and stable Respiratory status: nonlabored ventilation Cardiovascular status: stable Anesthetic complications: no     Last Vitals:  Vitals:   11/16/17 1154 11/16/17 1207  BP: 99/61 112/61  Pulse: 80 89  Resp: (!) 25 18  Temp:    SpO2: 94% 99%    Last Pain:  Vitals:   11/16/17 1125  TempSrc:   PainSc: 0-No pain                 VAN STAVEREN,Cartier Mapel

## 2017-11-16 NOTE — Procedures (Signed)
ECT SERVICES Physician's Interval Evaluation & Treatment Note  Patient Identification: Natasha [name] MRN:  [id number] Date of Evaluation:  11/16/2017 TX #: 4  MADRS: 26  MMSE: 29  P.E. Findings:  No change to physical exam  Psychiatric Interval Note:  Mood is stable doing well subjectively feeling pretty good  Subjective:  Patient is a 46 y.o. female seen for evaluation for Electroconvulsive Therapy. No complaints doing fairly well.  Treatment Summary:   [x]   Right Unilateral             []  Bilateral   % Energy : 0.3 ms 30%   Impedance: 1260 ohms  Seizure Energy Index: 6095 V squared  Postictal Suppression Index: 70%  Seizure Concordance Index: 97%  Medications  Pre Shock: Robinul 0.1 mg, Toradol 30 mg, Brevital 50 mg, succinylcholine 60 mg  Post Shock:    Seizure Duration: 72 seconds by EMG 106 seconds by EEG   Comments: Follow-up in 2 weeks  Lungs:  [x]   Clear to auscultation               []  Other:   Heart:    [x]   Regular rhythm             []  irregular rhythm    [x]   Previous H&P reviewed, patient examined and there are NO CHANGES                 []   Previous H&P reviewed, patient examined and there are changes noted.   Mordecai RasmussenJohn Clapacs, MD 12/5/201811:08 AM

## 2017-11-16 NOTE — ED Triage Notes (Signed)
Pt states she [facility] this am and has had a migraine ever since. Pt reports ha/nausea/sensitivity to light and sound. Pt has history of same.

## 2017-11-16 NOTE — Discharge Instructions (Signed)
1)  The drugs that you have been given will stay in your system until tomorrow so for the       next 24 hours you should not:  A. Drive an automobile  B. Make any legal decisions  C. Drink any alcoholic beverages  2)  You may resume your regular meals upon return home.  3)  A responsible adult must take you home.  Someone should stay with you for a few          hours, then be available by phone for the remainder of the treatment day.  4)  You May experience any of the following symptoms:  Headache, Nausea and a dry mouth (due to the medications you were given),  temporary memory loss( ECT consent form given with information about side effects given to pt) and some confusion, or sore muscles (a warm bath  should help this).  If you you experience any of these symptoms let us know on                your return visit.  5)  Report any of the following: any acute discomfort, severe headache, or temperature        greater than 100.5 F.   Also report any unusual redness, swelling, drainage, or pain         at your IV site.    You may report Symptoms to:  ECT PROGRAM- [place] [facility]          Phone: [phone], ECT Department           or Dr. Shary Keylapac's office [phone]  6)  Your next ECT Treatment is Day Wednesday  Date November 30, 2017  We will call 2 days prior to your scheduled appointment for arrival times.  7)  Nothing to eat or drink after midnight the night before your procedure.  8)  Take .  With a sip of water the morning of your procedure.  9)  Other Instructions: Call [phone] to cancel the morning of your procedure due         to illness or emergency.  10) We will call within 72 hours to assess how you are feeling.

## 2017-11-16 NOTE — Anesthesia Post-op Follow-up Note (Signed)
Anesthesia QCDR form completed.        

## 2017-11-16 NOTE — ED Notes (Signed)
Helped pt to the bathroom by wheel chair

## 2017-11-28 ENCOUNTER — Telehealth: Payer: Self-pay

## 2017-12-01 ENCOUNTER — Other Ambulatory Visit (HOSPITAL_COMMUNITY): Payer: Self-pay | Admitting: Psychiatry

## 2017-12-01 MED ORDER — TRAZODONE HCL 100 MG PO TABS: 200.0000 mg | ORAL_TABLET | Freq: Every day | ORAL | 0 refills | Status: DC

## 2017-12-01 MED ORDER — DULOXETINE HCL 60 MG PO CPEP: 60.0000 mg | ORAL_CAPSULE | Freq: Every day | ORAL | 0 refills | Status: DC

## 2017-12-01 MED ORDER — QUETIAPINE FUMARATE 25 MG PO TABS: 25.0000 mg | ORAL_TABLET | Freq: Two times a day (BID) | ORAL | 0 refills | Status: DC

## 2017-12-01 MED ORDER — BUSPIRONE HCL 10 MG PO TABS: 10.0000 mg | ORAL_TABLET | Freq: Three times a day (TID) | ORAL | 0 refills | Status: DC

## 2017-12-19 NOTE — Progress Notes (Signed)
BH MD/PA/NP OP Progress Note  12/22/2017 9:43 AM Natasha [name]  MRN:  [id number]  Chief Complaint:  Chief Complaint    Depression; Follow-up     HPI:  - Since the last appointment, patient presented to ED for migraine; treated with Ketolorac.  - Patient was admitted to [facility] [place] for suicide attempt twice by overdosing trazodone, then transferred to [facility] for continued treatment with ECT.   Patient states that she feels better since around Christmas.  She was admitted to behavioral health in [place] with worsening depression; she states that she felt guilty to send her mother to her nursing home.  It has been very difficult for her to see her mother because she did not want to leave the house.  She agrees that she made decision to take care of her mother while taking care of herself. She feels that medication has been working quite well, although she notices she will get easily more anxious than depressed when she misses to take ones when she wakes up later in the morning.  She also tends to question herself, which makes her feel more depressed.  She is now doing "self check" and does what she learned in the past through admissions. She feels scared at times, being afraid of becoming depressed again. She would like to work in the future.  She agrees to explore little steps she can take each day to be in line with her value. She felt better when she could get her mother around Christmas at home. Her sleep schedule tends to be erratic at times, although she denies middle sleep. She has fair appetite. She reports fair concentration. She has passive SI, much less compared to the past. She feels tense, anxious and has panic attacks, especially when she misses to take medication at regular time. She rarely takes clonazepam.   Wt Readings from Last 3 Encounters:  12/22/17 124 lb (56.2 kg)  11/16/17 120 lb (54.4 kg)  11/16/17 122 lb (55.3 kg)    Clonazepam filled on 09/04/2017  I have  utilized the [place] Controlled Substances Reporting System (PMP AWARxE) to confirm adherence regarding the patient's medication. My review reveals appropriate prescription fills.   Visit Diagnosis:    ICD-10-CM   1. Major depressive disorder, recurrent, severe without psychotic features (HCC) F33.2     Past Psychiatric History:  I have reviewed the patient's psychiatry history in detail and updated the patient record. Outpatient: She reports depression since 22-Apr-1992, since her friend deceased. She reports doing well without medication 2002-2009. She used to be seen by Dr. Lolly Mustache, last in 03/2017, diagnosed with MDD without psychotic features. Sees Dr. Minus Liberty for ECT Psychiatry admission: numerous inpatient at [facility] [place] [facility] Ad[facility] to [facility] in [place] in September 2018 for depression, SI. Another admission at [facility] for SI in 10/2017.  Previous suicide attempt: "more than I can count", overdose on medication, cutting her wrist, last in Apr 22, 2001 by jumping from the parking lot at [facility] after discharge, which causedspinal injury at C6/7 Past trials of medication: Lexapro, Prozac, Paxil, Zoloft, Effexor, Wellbutrin, Remeron, Depakote, Tegretol, lithium, trazodone, Geodon, Risperdal, olanzapine, Abilify History of violence: denies   Past Medical History:  Past Medical History:  Diagnosis Date  . C6 spinal cord injury (HCC)   . Depression     Past Surgical History:  Procedure Laterality Date  . BACK SURGERY    . NECK SURGERY      Family Psychiatric History:  I have reviewed the  patient's family history in detail and updated the patient record. Great grandmother- attempted suicide, cousin- attempted suicide, brother- substance use, father- substance use   Family History:  Family History  Problem Relation Age of Onset  . Alcohol abuse Father   . Alcohol abuse Brother   . Depression Mother   . Alcohol abuse Mother     Social History:  Social History    Socioeconomic History  . Marital status: Single    Spouse name: None  . Number of children: None  . Years of education: None  . Highest education level: None  Social Needs  . Financial resource strain: None  . Food insecurity - worry: None  . Food insecurity - inability: None  . Transportation needs - medical: None  . Transportation needs - non-medical: None  Occupational History  . None  Tobacco Use  . Smoking status: Former Games developer  . Smokeless tobacco: Never Used  . Tobacco comment: No smoking hx; no need for cessation materials  Substance and Sexual Activity  . Alcohol use: No    Alcohol/week: 0.0 oz    Comment: Occasional use  . Drug use: No  . Sexual activity: No  Other Topics Concern  . None  Social History Narrative  . None    Allergies: No Known Allergies  Metabolic Disorder Labs: Lab Results  Component Value Date   HGBA1C 4.7 (L) 10/23/2017   MPG 88.19 10/23/2017   MPG 88 02/03/2017   Lab Results  Component Value Date   PROLACTIN 121.9 (H) 02/03/2017   Lab Results  Component Value Date   CHOL 181 10/23/2017   TRIG 56 10/23/2017   HDL 46 10/23/2017   CHOLHDL 3.9 10/23/2017   VLDL 11 10/23/2017   LDLCALC 124 (H) 10/23/2017   LDLCALC 149 (H) 02/03/2017   Lab Results  Component Value Date   TSH 5.474 (H) 02/03/2017   TSH 4.924 (H) 06/11/2015    Therapeutic Level Labs: Lab Results  Component Value Date   LITHIUM 0.88 02/13/2017   LITHIUM 0.57 (L) 02/03/2017   No results found for: VALPROATE No components found for:  CBMZ  Current Medications: Current Outpatient Medications  Medication Sig Dispense Refill  . busPIRone (BUSPAR) 10 MG tablet Take 1 tablet (10 mg total) by mouth 3 (three) times daily. 270 tablet 0  . clonazePAM (KLONOPIN) 0.5 MG tablet Take 1 tablet (0.5 mg total) by mouth 2 (two) times daily as needed (anxiety). 30 tablet 0  . DULoxetine (CYMBALTA) 60 MG capsule Take 1 capsule (60 mg total) by mouth daily. 90 capsule 0  .  QUEtiapine (SEROQUEL) 25 MG tablet Take 1 tablet (25 mg total) by mouth 2 (two) times daily. 180 tablet 0  . traZODone (DESYREL) 100 MG tablet 100-200 mg at night as needed for sleep 180 tablet 0   No current facility-administered medications for this visit.      Musculoskeletal: Strength & Muscle Tone: decreased Gait & Station: unsteady Patient leans: N/A  Psychiatric Specialty Exam: Review of Systems  Neurological: Positive for sensory change.  Psychiatric/Behavioral: Positive for depression and suicidal ideas. Negative for hallucinations, memory loss and substance abuse. The patient is nervous/anxious and has insomnia.   All other systems reviewed and are negative.   Blood pressure 137/80, pulse 70, height 5' (1.524 m), weight 124 lb (56.2 kg), SpO2 100 %.Body mass index is 24.22 kg/m.  General Appearance: Fairly Groomed  Eye Contact:  Good  Speech:  Clear and Coherent  Volume:  Normal  Mood:  "better"  Affect:  less down, less restricted-improving, calmer  Thought Process:  Coherent and Goal Directed  Orientation:  Full (Time, Place, and Person)  Thought Content: Logical   Suicidal Thoughts:  Yes.  without intent/plan  Homicidal Thoughts:  No  Memory:  Immediate;   Good Recent;   Good Remote;   Good  Judgement:  Good  Insight:  Good  Psychomotor Activity:  Normal  Concentration:  Concentration: Good and Attention Span: Good  Recall:  Good  Fund of Knowledge: Good  Language: Good  Akathisia:  No  Handed:  Right  AIMS (if indicated): not done  Assets:  Communication Skills Desire for Improvement  ADL's:  Intact  Cognition: WNL  Sleep:  Fair   Screenings: AIMS     [facility] Ad[facility] (Discharged) from OP Visit [facility] Ad[facility] (Discharged) from OP [facility]  (Discharged) from OP [facility]  AIMS Total Score  0  0  0  0    AUDIT     [facility] Admission (Discharged) from OP [facility] Admission (Discharged) from OP Visit [facility] Admission (Discharged) from OP [facility]  Alcohol Use Disorder Identification Test Final Score (AUDIT)  0  0  1  1    ECT-MADRS     [facility] Ad[facility] (Discharged) [facility]  MADRS Total Score  4  43    GAD-7     Counselor from 06/24/2016 in BEHAVIORAL HEALTH OUTPATIENT THERAPY [place]  Total GAD-7 Score  15    Mini-Mental     ECT [facility]16[facility] Admission (Discharged) [facility]  Total Score (max 30 points )  29  30    PHQ2-9     Counselor from 06/24/2016 in BEHAVIORAL HEALTH OUTPATIENT THERAPY [place] Counselor from 11/04/2015 in BEHAVIORAL HEALTH OUTPATIENT THERAPY [place] Counselor from 06/27/2015 in BEHAVIORAL HEALTH INTENSIVE PSYCH  PHQ-2 Total Score  2  6  6   PHQ-9 Total Score  7  16  26        Assessment and Plan:  Chandel Lubertha SouthKatherine Oloughlin is a 47 y.o. year old female with a history of depression ,significant history of past suicide attempts, s/p C6 spinal cord injury , who presents for follow up appointment for Major depressive disorder, recurrent, severe without psychotic features (HCC)  # MDD, severe without psychotic features There has been significant improvement in neurovegetative symptoms since the last appointment.  Will continue duloxetine to target depression.  Will continue BuSpar for anxiety.   Will continue quetiapine as adjunctive treatment for depression.  Will continue clonazepam as needed for anxiety.  Explored her value of taking care of her mother while having self compassion.  Discussed the ways she can take action inline with her value. Noted that she received ECT last in 11/2018. She will get back to the program as needed.   # Insomnia She reports disruption in circadian rhythm. Discussed sleep hygiene. She is also advised to try melatonin for sleep. Will continue trazodone  as needed for insomnia.  She is advised to try a lower dose if she has weakness at 200 mg.    Plan 1. Continue duloxetine 60 mg daily  2. Continue Buspar 10 mg three times a day 3. Continue quetiapine 25 mg twice a day 4. Continue trazodone 100- 200 mg at night as needed for sleep  5. Continue clonazepam 0.5 mg twice a day for anxiety (ordered for a month given she does not take it as often) 6. Try melatonin 3 mg two hours before going to bed 7. Return to clinic in three months for 30 mins  - She will continue to see Mr. Sheets for therapy  The patient demonstrates the following risk factors for suicide: Chronic risk factors for suicide include: psychiatric disorder of depression, previous suicide attempts of jumping from the balcony, overdosing medication, previous self-harm cutting, medical illness of spinal cord injuryand completed suicide in a family member. Acute risk factorsfor suicide include: unemployment, Estate agent and loss (financial, interpersonal, professional). Protective factorsfor this patient include: coping skills and hope for the future. Considering these factors, the overall suicide risk at this point appears to be moderate, but not at imminent danger to self. Patient isappropriate for outpatient follow up.  The duration of this appointment visit was 30 minutes of face-to-face time with the patient.  Greater than 50% of this time was spent in counseling, explanation of   diagnosis, planning of further management, and coordination of care.  Neysa Hotter, MD 12/22/2017, 9:43 AM

## 2017-12-22 ENCOUNTER — Ambulatory Visit (INDEPENDENT_AMBULATORY_CARE_PROVIDER_SITE_OTHER): Payer: Medicare Other | Admitting: Psychiatry

## 2017-12-22 ENCOUNTER — Encounter (HOSPITAL_COMMUNITY): Payer: Self-pay | Admitting: Psychiatry

## 2017-12-22 VITALS — BP 137/80 | HR 70 | Ht 60.0 in | Wt 124.0 lb

## 2017-12-22 DIAGNOSIS — F419 Anxiety disorder, unspecified: Secondary | ICD-10-CM | POA: Diagnosis not present

## 2017-12-22 DIAGNOSIS — F332 Major depressive disorder, recurrent severe without psychotic features: Secondary | ICD-10-CM | POA: Diagnosis not present

## 2017-12-22 DIAGNOSIS — R450 Nervousness: Secondary | ICD-10-CM

## 2017-12-22 DIAGNOSIS — Z818 Family history of other mental and behavioral disorders: Secondary | ICD-10-CM

## 2017-12-22 DIAGNOSIS — Z813 Family history of other psychoactive substance abuse and dependence: Secondary | ICD-10-CM

## 2017-12-22 DIAGNOSIS — G4700 Insomnia, unspecified: Secondary | ICD-10-CM

## 2017-12-22 DIAGNOSIS — T1491XA Suicide attempt, initial encounter: Secondary | ICD-10-CM | POA: Diagnosis not present

## 2017-12-22 DIAGNOSIS — Z87891 Personal history of nicotine dependence: Secondary | ICD-10-CM | POA: Diagnosis not present

## 2017-12-22 DIAGNOSIS — Z811 Family history of alcohol abuse and dependence: Secondary | ICD-10-CM | POA: Diagnosis not present

## 2017-12-22 DIAGNOSIS — T43212A Poisoning by selective serotonin and norepinephrine reuptake inhibitors, intentional self-harm, initial encounter: Secondary | ICD-10-CM

## 2017-12-22 MED ORDER — QUETIAPINE FUMARATE 25 MG PO TABS: 25.0000 mg | ORAL_TABLET | Freq: Two times a day (BID) | ORAL | 0 refills | Status: DC

## 2017-12-22 MED ORDER — BUSPIRONE HCL 10 MG PO TABS: 10.0000 mg | ORAL_TABLET | Freq: Three times a day (TID) | ORAL | 0 refills | Status: DC

## 2017-12-22 MED ORDER — TRAZODONE HCL 100 MG PO TABS: ORAL_TABLET | ORAL | 0 refills | Status: DC

## 2017-12-22 MED ORDER — DULOXETINE HCL 60 MG PO CPEP: 60.0000 mg | ORAL_CAPSULE | Freq: Every day | ORAL | 0 refills | Status: DC

## 2017-12-22 MED ORDER — CLONAZEPAM 0.5 MG PO TABS: 0.5000 mg | ORAL_TABLET | Freq: Two times a day (BID) | ORAL | 0 refills | Status: DC | PRN

## 2017-12-22 NOTE — Patient Instructions (Signed)
1. Continue duloxetine 60 mg daily  2. Continue Buspar 10 mg three times a day 3. Continue quetiapine 25 mg twice a day 4. Continue trazodone 100 200 mg at night as needed for sleep  5. Continue clonazepam 0.5 mg twice a day for anxiety 6. Try melatonin 3 mg two hours before going to bed 7. Return to clinic in three months for 30 mins

## 2017-12-28 ENCOUNTER — Ambulatory Visit (HOSPITAL_COMMUNITY): Payer: Self-pay | Admitting: Licensed Clinical Social Worker

## 2017-12-30 DIAGNOSIS — G43909 Migraine, unspecified, not intractable, without status migrainosus: Secondary | ICD-10-CM | POA: Diagnosis not present

## 2017-12-30 DIAGNOSIS — Z1389 Encounter for screening for other disorder: Secondary | ICD-10-CM | POA: Diagnosis not present

## 2017-12-30 DIAGNOSIS — Z6824 Body mass index (BMI) 24.0-24.9, adult: Secondary | ICD-10-CM | POA: Diagnosis not present

## 2017-12-30 DIAGNOSIS — R946 Abnormal results of thyroid function studies: Secondary | ICD-10-CM | POA: Diagnosis not present

## 2017-12-30 DIAGNOSIS — Z0001 Encounter for general adult medical examination with abnormal findings: Secondary | ICD-10-CM | POA: Diagnosis not present

## 2017-12-30 DIAGNOSIS — E785 Hyperlipidemia, unspecified: Secondary | ICD-10-CM | POA: Diagnosis not present

## 2017-12-30 DIAGNOSIS — G8929 Other chronic pain: Secondary | ICD-10-CM | POA: Diagnosis not present

## 2018-01-11 ENCOUNTER — Ambulatory Visit (INDEPENDENT_AMBULATORY_CARE_PROVIDER_SITE_OTHER): Payer: Medicare Other | Admitting: Licensed Clinical Social Worker

## 2018-01-11 ENCOUNTER — Encounter (HOSPITAL_COMMUNITY): Payer: Self-pay | Admitting: Licensed Clinical Social Worker

## 2018-01-11 DIAGNOSIS — F332 Major depressive disorder, recurrent severe without psychotic features: Secondary | ICD-10-CM | POA: Diagnosis not present

## 2018-01-11 DIAGNOSIS — Z915 Personal history of self-harm: Secondary | ICD-10-CM | POA: Diagnosis not present

## 2018-01-11 NOTE — Progress Notes (Signed)
   THERAPIST PROGRESS NOTE  Session Time: 2:00 pm-2:50  Participation Level: Active  Behavioral Response: CasualAlertDepressed  Type of Therapy: Individual Therapy  Treatment Goals addressed: Coping  Interventions: CBT and Solution Focused  Summary: Natasha Lubertha SouthKatherine [name] is a 47 y.o. female who presents  oriented x5 (person, place, situation, time and object), alert, casually dressed, appropriately groomed, walking with a cane and limp, depressed and cooperative  to address mood. Patient has a history of medical treatment due to attempted suicide and a history of mental health treatment including hospitalization, outpatient therapy and medication management. Patient denies symptoms of mania. Patient admitted passive thoughts of suicide and a history of suicide attempts. Patient denies homicidal ideations. Patient denies psychosis including auditory and visual hallucinations. Patient denies substance abuse. Patient is at moderate risk for lethality due to a history of suicidal ideations and attempts.  Physically: Patient reported that she is feeling better than she has in a long time. She also noted that she has started to experience some loss of feeling in her hands but is going to do physical therapy alleviate symptoms.  Spiritually/values: No issues related.  Relationships: Patient reported that her relationships are going well. She still occasionally feels guilt that her mother is in an assisted living home but also recognizes that her mother is getting the care she needs there.  Emotional/Mental/Behavior: Patient reported that she is feeling better mentally and emotionally. She is using self check ins and self talk to help her through the day. She is also working on completing daily tasks/lists which helps her structure her time. Patient is learning to be ok with where she is at and not compare herself to others.   Patient engaged in session. She responded well to interventions. Patient  continues to meet criteria for Major depressive disorder, recurrent, severe without psychotic features. Patient will continue in outpatient therapy due to being the least restrictive service to meet her needs at this time. Patient made minimal  progress on her goals.   Suicidal/Homicidal: Negativewithout intent/plan  Therapist Response: Therapist reviewed patient's recent thoughts and feelings. Therapist utilized CBT to address mood. Therapist processed patient's feelings to identify triggers for depression. Therapist assisted patient in identifying small steps to take to reduce her feelings of depression. Therapist assisted patient in identifying ways she can maintain her mood and continue to make progress.   Plan: Return again in 2 weeks.   Diagnosis: Axis I: Major depressive disorder, recurrent, severe without psychotic features    Axis II: No diagnosis    Bynum BellowsJoshua Jemaine Prokop, LCSW 01/11/2018

## 2018-01-27 ENCOUNTER — Encounter (HOSPITAL_COMMUNITY): Payer: Self-pay

## 2018-01-27 ENCOUNTER — Ambulatory Visit (HOSPITAL_COMMUNITY): Payer: Medicare Other | Admitting: Occupational Therapy

## 2018-01-30 ENCOUNTER — Ambulatory Visit (HOSPITAL_COMMUNITY): Payer: Medicare Other | Attending: Internal Medicine | Admitting: Specialist

## 2018-01-30 ENCOUNTER — Other Ambulatory Visit: Payer: Self-pay

## 2018-01-30 ENCOUNTER — Encounter (HOSPITAL_COMMUNITY): Payer: Self-pay | Admitting: Specialist

## 2018-01-30 DIAGNOSIS — R29818 Other symptoms and signs involving the nervous system: Secondary | ICD-10-CM | POA: Diagnosis not present

## 2018-01-30 DIAGNOSIS — M6281 Muscle weakness (generalized): Secondary | ICD-10-CM

## 2018-01-30 DIAGNOSIS — R278 Other lack of coordination: Secondary | ICD-10-CM

## 2018-01-30 DIAGNOSIS — M25532 Pain in left wrist: Secondary | ICD-10-CM | POA: Insufficient documentation

## 2018-01-30 NOTE — Therapy (Signed)
[facility] [address] [place], [place], [address] Phone: [phone]   Fax:  [phone]  Occupational Therapy Evaluation  Patient Details  Name: Natasha [name] MRN: [id number] Date of Birth: 04/09/1971 Referring Provider: Dr. Elfredia Nevins   Encounter Date: 01/30/2018  OT End of Session - 01/30/18 2143    Visit Number  1    Number of Visits  16    Date for OT Re-Evaluation  03/31/18 mini reassess on 02/28/18    Authorization Type  Medicare/Medicaid no auth required    OT Start Time  0900    OT Stop Time  0950    OT Time Calculation (min)  50 min    Activity Tolerance  Patient tolerated treatment well    Behavior During Therapy  [facility] for tasks assessed/performed       Past Medical History:  Diagnosis Date  . C6 spinal cord injury (HCC)   . Depression     Past Surgical History:  Procedure Laterality Date  . BACK SURGERY    . NECK SURGERY      There were no vitals filed for this visit.  Subjective Assessment - 01/30/18 1500    Subjective   S:  I have noticed a decrease in my active wrist movement and increased numbness and tingling in my left hand since Thanksgiving.    Pertinent History  Natasha [name] reports suffering a C6 spinal cord injury in 2002.  After recieving therapy on an inpatient basis for 2 years, she was discharged home wihout therapy.  She reports managing her deficts from her SCI well.  She noticed in November of this year a significant decrease in active wrist extension and increased numbness and tingling in her left hand.  She consulted with Dr. Sherwood Gambler and has been referred to occupational therapy for evaluation and treatment.      Special Tests  DASH see flow sheet    Patient Stated Goals  increase use of left hand and decrease pain.     Currently in Pain?  Yes    Pain Score  4     Pain Location  Hand    Pain Orientation  Left    Pain Descriptors / Indicators  Pins and needles    Pain Type  Acute  pain    Pain Radiating Towards  hand and wrist    Pain Onset  More than a month ago    Pain Frequency  Constant    Aggravating Factors   everything    Pain Relieving Factors  no    Effect of Pain on Daily Activities  moderate        [facility] OT Assessment - 01/30/18 0001      Assessment   Medical Diagnosis  Left Wrist and Hand Contracture    Referring Provider  Dr. Elfredia Nevins    Onset Date/Surgical Date  -- November 2018    Hand Dominance  Right    Next MD Visit  -- unknown    Prior Therapy  2002-2004      Precautions   Precautions  Fall      Restrictions   Weight Bearing Restrictions  No      Balance Screen   Has the patient fallen in the past 6 months  No    Has the patient had a decrease in activity level because of a fear of falling?   No    Is the patient reluctant to leave their home  because of a fear of falling?   No      Home  Environment   Family/patient expects to be discharged to:  Private residence    Living Arrangements  Alone    Available Help at Discharge  Family    Lives With  Alone      Prior Function   Level of Independence  Independent with basic ADLs;Independent with household mobility with device;Requires assistive device for independence unable to drive    Vocation  On disability    Leisure  computer, reading, talking to her mom on the phone      ADL   ADL comments  utilizing compensatory movements, patient is able to complete most daily tasks with modified independence.  She is now having difficulty and increased pain using her left hand and arm as an active assist with her daily activities  She is having difficulty with her handwriting.        Mobility   Mobility Status  Needs assist    Mobility Status Comments  ambulates iwth a lofstrand crutch in her right arm      Written Expression   Dominant Hand  Right    Handwriting  -- need to assess    Written Experience  -- need to assess, states it is difficult and painful    Effective  Techniques  -- would like to try strategies for increased legibility       Vision - History   Baseline Vision  Wears glasses all the time      Cognition   Overall Cognitive Status  Within Functional Limits for tasks assessed      Observation/Other Assessments   Other Surveys   Select    Quick DASH   47.73      Sensation   Semmes Weinstein Monofilament Scale  Unresponsive left dorsum of hand, 4.31 left palmar, right 3.61      Coordination   Gross Motor Movements are Fluid and Coordinated  Yes    9 Hole Peg Test  Right;Left    Right 9 Hole Peg Test  1 minute 17.40 seconds    Left 9 Hole Peg Test  1 minute 21.67 seconds      ROM / Strength   AROM / PROM / Strength  AROM;Strength      AROM   Overall AROM Comments  BUE A/ROM:  shoulder flexion and abduction 90 degrees, external rotation 75% range (at baseline), internal rotation WFL, elbow flexion and extension is WFL, supination and pronation is WFL, right wrist flexion/extension is WFL left wrist flexion is WFL, extension 0.  right hand 75% range, left hand 50-75% range      Strength   Strength Assessment Site  Shoulder;Elbow;Forearm;Wrist    Right/Left Shoulder  Right;Left    Right Shoulder Flexion  4/5    Right Shoulder Extension  4/5    Right Shoulder ABduction  4/5    Right Shoulder Internal Rotation  4/5    Right Shoulder External Rotation  4/5    Left Shoulder Flexion  4/5    Left Shoulder Extension  4/5    Left Shoulder ABduction  4/5    Left Shoulder Internal Rotation  4/5    Left Shoulder External Rotation  4/5    Right/Left Elbow  Right;Left    Right Elbow Flexion  4+/5    Right Elbow Extension  2/5    Left Elbow Flexion  4-/5    Left Elbow Extension  2/5  Right/Left Forearm  Right;Left    Right Forearm Pronation  4-/5    Right Forearm Supination  4-/5    Left Forearm Pronation  4-/5    Left Forearm Supination  4-/5    Right/Left Wrist  Right;Left    Right Wrist Flexion  4/5    Right Wrist Extension  5/5     Left Wrist Flexion  5/5    Left Wrist Extension  1/5      Hand Function   Right Hand Grip (lbs)  26    Right Hand Lateral Pinch  4 lbs    Left Hand Grip (lbs)  18    Left Hand Lateral Pinch  4 lbs          Quick Dash - 01/30/18 0001    Open a tight or new jar  Severe difficulty    Do heavy household chores (wash walls, wash floors)  Severe difficulty    Carry a shopping bag or briefcase  Mild difficulty    Wash your back  No difficulty    Use a knife to cut food  Unable    Recreational activities in which you take some force or impact through your arm, shoulder, or hand (golf, hammering, tennis)  Unable    During the past week, to what extent has your arm, shoulder or hand problem interfered with your normal social activities with family, friends, neighbors, or groups?  Not at all    During the past week, to what extent has your arm, shoulder or hand problem limited your work or other regular daily activities  Not at all    Arm, shoulder, or hand pain.  Severe    Tingling (pins and needles) in your arm, shoulder, or hand  Severe    Difficulty Sleeping  No difficulty    DASH Score  47.73 %          OT Treatments/Exercises (OP) - 01/30/18 0001      Exercises   Exercises  Theraputty;Hand            OT Education - 01/30/18 1510    Education provided  Yes    Education Details  wrist extension:  isometric, eccentric, aa/rom    Person(s) Educated  Patient    Methods  Explanation;Demonstration;Handout    Comprehension  Verbalized understanding;Returned demonstration       OT Short Term Goals - 01/30/18 2152      OT SHORT TERM GOAL #1   Title  Patient will be educated on a HEP for improved left wrist extension needed to use left hand and arm as an active assist with daily activiites.     Time  4    Period  Weeks    Status  New    Target Date  03/01/18      OT SHORT TERM GOAL #2   Title  Patient will improve left wrist extension A/ROM to 25% for improved use  of left hand as an active assist with ADLs.    Time  4    Period  Weeks    Status  New      OT SHORT TERM GOAL #3   Title  Patient will improve left wrist strength to 3/5 for increased ability to tansfer with increased ease.    Time  4    Period  Weeks    Status  New      OT SHORT TERM GOAL #4   Title  Patient will improve bilateral  grip strength by 5 pounds for greater ability to maintain grasp on toothbrush and increased ease opening jars.     Time  4    Period  Weeks    Status  New      OT SHORT TERM GOAL #5   Title  Patient will be educated on various pens/grips for improving comfort when writing.     Time  4    Period  Weeks    Status  New        OT Long Term Goals - 01/30/18 2156      OT LONG TERM GOAL #1   Title  Patient will return to prior level of independence, using her left arm as an active assist with all daily activities.     Time  8    Period  Weeks    Status  New    Target Date  03/31/18      OT LONG TERM GOAL #2   Title  Patient will improve left wrist a/rom by 50% for improved ability to reach and grasp for household items with her left hand.    Time  8    Period  Weeks    Status  New      OT LONG TERM GOAL #3   Title  Patient will improve bilateral grip strength by 10 pounds for improved ability to maintain grasp on household items.     Time  8    Period  Weeks    Status  New      OT LONG TERM GOAL #4   Title  Paitent will improve bilateral hand fine motor coordination by decreasing completion time on nine hole peg test by 4 seconds.      Time  8    Period  Weeks    Status  New      OT LONG TERM GOAL #5   Title  Patient will be independent with use of compensatory handwriting utensils and improve comfort with writing.     Time  8    Period  Weeks    Status  New      Long Term Additional Goals   Additional Long Term Goals  Yes      OT LONG TERM GOAL #6   Title  Patient will improve pain in her left wrist and hand to 3/10 or better when  completing adls.    Time  8    Period  Weeks    Status  New      OT LONG TERM GOAL #7   Title  Paitent will improve left hand sensation to 4.31 on dorsum of her hand for improved safety during adls.    Time  8    Period  Weeks    Status  New      OT LONG TERM GOAL #8   Title  Patient will be independent with donning and doffing left hand splint.      Time  8    Period  Weeks    Status  New            Plan - 01/30/18 2144    Clinical Impression Statement  A:  Patient is a 47 year old female with history of C6 SCI in 2002.  Patient has been living alone at modified independent level, utilizing compesatory strategies to complete most tasks.  Her left hand and wrist had less mobility than right and could be used as an active assist with  daily activiites.  In November, she began noticing more numbness/tingling and less active movement in her left wrist extension.  She is having increased difficulty completing daily tasks due to these new deficits.  Natasha [name] also has difficulty with handwriting due to increased pain and decreased control of her right hand.      Occupational Profile and client history currently impacting functional performance  age, motivation    Occupational performance deficits (Please refer to evaluation for details):  ADL's;IADL's;Leisure;Social Participation    Rehab Potential  Good    Current Impairments/barriers affecting progress:  past medical history    OT Frequency  2x / week    OT Duration  8 weeks    OT Treatment/Interventions  Self-care/ADL training;Electrical Stimulation;Therapeutic exercise;Moist Heat;Neuromuscular education;Splinting;Patient/family education;Therapeutic activities;Energy conservation;Cryotherapy;Manual Therapy;Passive range of motion    Plan  P:  SKilled OT intervention to improve left wrist extension, left hand functional grasp and release, improve right hand handwriting, and improve bilateral hand function in order to return to prior  level of independence with daily activiites.  Next session:  review POC, begin improving A/ROM of left wrist and hand.  over next several sessions, fabricate a wrist extension splint possibly resting hand splint (fabricated or prefabricated).      Clinical Decision Making  Several treatment options, min-mod task modification necessary    OT Home Exercise Plan  wrist extension    Consulted and Agree with Plan of Care  Patient       Patient will benefit from skilled therapeutic intervention in order to improve the following deficits and impairments:  Impaired sensation, Increased muscle spasms, Pain, Impaired tone, Decreased range of motion, Impaired flexibility, Impaired UE functional use, Decreased strength  Visit Diagnosis: Other symptoms and signs involving the nervous system  Pain in left wrist  Muscle weakness (generalized)  Other lack of coordination    Problem List Patient Active Problem List   Diagnosis Date Noted  . Paraplegia (HCC) 10/21/2017  . GAD (generalized anxiety disorder) 10/20/2017  . Major depressive disorder, recurrent, severe without psychotic features [facility](HCC)     Shirlean MylarBethany H. Carroll Lingelbach, MHA, OTR/L [phone]  01/30/2018, 10:04 PM  [facility] [address] [place], [place], [address] Phone: [phone]   Fax:  [phone]  Name: Natasha [name] MRN: [id number] Date of Birth: 20-May-1971

## 2018-01-30 NOTE — Patient Instructions (Signed)
Wrist Extension: (Eccentric) - Elbow Extended    Arm on table, elbow straight, palm up, bend wrist, lowering hand. Then turn forearm over, keeping wrist bent, hand up. Slowly lower hand for 3-5 seconds. ___ reps per set, ___ sets per day, ___ days per week. Add ___ lbs when you achieve ___ repetitions.  http://ecce.exer.us/267   Copyright  VHI. All rights reserved.  Wrist Flexion / Extension in Pronation / Supination    Forearms on armrests, palms down, move ONE wrist up and down. Hold each position ___ seconds. Repeat ___ times, alternating hands. Do ___ sessions per day. Do with PALM up. Do with THUMB UP, moving wrist side-to-side.  Copyright  VHI. All rights reserved.  Wrist Extension: Isometric    With left forearm resting palm down on thigh, resist upward movement of hand with other hand. Hold ____ seconds. Relax. Repeat ____ times per set. Do ____ sets per session. Do ____ sessions per day.  Copyright  VHI. All rights reserved.

## 2018-01-31 ENCOUNTER — Telehealth (HOSPITAL_COMMUNITY): Payer: Self-pay | Admitting: Specialist

## 2018-01-31 NOTE — Telephone Encounter (Signed)
She has some issues with her Mom and can not attend today

## 2018-02-01 ENCOUNTER — Ambulatory Visit (HOSPITAL_COMMUNITY): Payer: Medicare Other | Admitting: Specialist

## 2018-02-15 ENCOUNTER — Ambulatory Visit (HOSPITAL_COMMUNITY): Payer: Medicare Other

## 2018-02-15 ENCOUNTER — Telehealth (HOSPITAL_COMMUNITY): Payer: Self-pay | Admitting: Internal Medicine

## 2018-02-15 ENCOUNTER — Ambulatory Visit (HOSPITAL_COMMUNITY): Payer: Self-pay | Admitting: Licensed Clinical Social Worker

## 2018-02-15 NOTE — Telephone Encounter (Signed)
02/15/18  pt called to say that [facility] didn't pick her up this morning

## 2018-02-17 ENCOUNTER — Encounter (HOSPITAL_COMMUNITY): Payer: Self-pay | Admitting: Occupational Therapy

## 2018-02-17 ENCOUNTER — Ambulatory Visit (HOSPITAL_COMMUNITY): Payer: Medicare Other | Attending: Internal Medicine | Admitting: Occupational Therapy

## 2018-02-17 DIAGNOSIS — M25532 Pain in left wrist: Secondary | ICD-10-CM

## 2018-02-17 DIAGNOSIS — R29818 Other symptoms and signs involving the nervous system: Secondary | ICD-10-CM | POA: Diagnosis not present

## 2018-02-17 DIAGNOSIS — M6281 Muscle weakness (generalized): Secondary | ICD-10-CM | POA: Insufficient documentation

## 2018-02-17 DIAGNOSIS — R278 Other lack of coordination: Secondary | ICD-10-CM

## 2018-02-17 NOTE — Therapy (Signed)
[facility] [address] [place], [place], [address] Phone: [phone]   Fax:  [phone]  Occupational Therapy Treatment  Patient Details  Name: Natasha [name] MRN: [id number] Date of Birth: [birth year] Referring Provider: Dr. Elfredia Nevins   Encounter Date: 02/17/2018  OT End of Session - 02/17/18 1251    Visit Number  2    Number of Visits  16    Date for OT Re-Evaluation  03/31/18 mini reassess on 02/28/18    Authorization Type  Medicare/Medicaid no auth required    OT Start Time  1118    OT Stop Time  1207    OT Time Calculation (min)  49 min    Activity Tolerance  Patient tolerated treatment well    Behavior During Therapy  [facility] for tasks assessed/performed       Past Medical History:  Diagnosis Date  . C6 spinal cord injury (HCC)   . Depression     Past Surgical History:  Procedure Laterality Date  . BACK SURGERY    . NECK SURGERY      There were no vitals filed for this visit.  Subjective Assessment - 02/17/18 1115    Subjective   S: I was glad to know I have some muscle action in this left hand.     Currently in Pain?  No/denies         [facility] OT Assessment - 02/17/18 1115      Assessment   Medical Diagnosis  Left Wrist and Hand Contracture      Precautions   Precautions  Fall      Written Expression   Handwriting  90% legible;Increased time    Written Experience  Exceptions to [facility]     Writing Interfering Components  Other (comment( fatigue and hand pain                OT Treatments/Exercises (OP) - 02/17/18 1129      ADLs   Writing  Pt worked on handwriting this session testing various types of grippers. Pt found the tip grip and standard gripper to improve ability to grasp pen and reduced fatigue during and after handwriting task.  Pt was also able to improve steadiness in handwriting and was able to write smaller with greater ease.       Exercises   Exercises  Hand;Wrist      Wrist Exercises   Wrist Flexion  PROM;AROM;Left;5 reps    Wrist Extension  PROM;AROM;Left;5 reps      Additional Wrist Exercises   Sponges  3 with right hand working on grasping and squeezing      Hand Exercises   Other Hand Exercises  3# digitizer with left hand-OT assisting for positioning, 5x; 7# digitizer with right hand, 5x      Neurological Re-education Exercises   Finger Flexion  Pt held OT's fingers and squeezed, pulling fingers to palm after extension exercise, 5x    Finger Extension  Pt working on left digit extension, OT providing mod assist, 5x    Thumb Opposition  P/ROM, A/ROM 5x each                      OT Education - 02/17/18 1255    Education provided  Yes    Education Details  provided evaluation and reviewed goals with pt; provided grips for pens    Person(s) Educated  Patient    Methods  Explanation;Demonstration;Handout    Comprehension  Verbalized understanding;Returned demonstration       OT Short Term Goals - 02/17/18 1254      OT SHORT TERM GOAL #1   Title  Patient will be educated on a HEP for improved left wrist extension needed to use left hand and arm as an active assist with daily activiites.     Time  4    Period  Weeks    Status  On-going      OT SHORT TERM GOAL #2   Title  Patient will improve left wrist extension A/ROM to 25% for improved use of left hand as an active assist with ADLs.    Time  4    Period  Weeks    Status  On-going      OT SHORT TERM GOAL #3   Title  Patient will improve left wrist strength to 3/5 for increased ability to tansfer with increased ease.    Time  4    Period  Weeks    Status  On-going      OT SHORT TERM GOAL #4   Title  Patient will improve bilateral grip strength by 5 pounds for greater ability to maintain grasp on toothbrush and increased ease opening jars.     Time  4    Period  Weeks    Status  On-going      OT SHORT TERM GOAL #5   Title  Patient will be educated on various pens/grips  for improving comfort when writing.     Time  4    Period  Weeks    Status  On-going        OT Long Term Goals - 02/17/18 1254      OT LONG TERM GOAL #1   Title  Patient will return to prior level of independence, using her left arm as an active assist with all daily activities.     Time  8    Period  Weeks    Status  On-going      OT LONG TERM GOAL #2   Title  Patient will improve left wrist a/rom by 50% for improved ability to reach and grasp for household items with her left hand.    Time  8    Period  Weeks    Status  On-going      OT LONG TERM GOAL #3   Title  Patient will improve bilateral grip strength by 10 pounds for improved ability to maintain grasp on household items.     Time  8    Period  Weeks    Status  On-going      OT LONG TERM GOAL #4   Title  Paitent will improve bilateral hand fine motor coordination by decreasing completion time on nine hole peg test by 4 seconds.      Time  8    Period  Weeks    Status  On-going      OT LONG TERM GOAL #5   Title  Patient will be independent with use of compensatory handwriting utensils and improve comfort with writing.     Time  8    Period  Weeks    Status  On-going      OT LONG TERM GOAL #6   Title  Patient will improve pain in her left wrist and hand to 3/10 or better when completing adls.    Time  8    Period  Weeks    Status  On-going  OT LONG TERM GOAL #7   Title  Paitent will improve left hand sensation to 4.31 on dorsum of her hand for improved safety during adls.    Time  8    Period  Weeks    Status  On-going      OT LONG TERM GOAL #8   Title  Patient will be independent with donning and doffing left hand splint.      Time  8    Period  Weeks    Status  On-going            Plan - 02/17/18 1251    Clinical Impression Statement  A: Session focusing on assessing handwriting and utilizing various grips for improved handwriting sucess. Pt able to improve legibility and tolerance to  writing as well as reduce fatigue with use of various grips. Pt provided with standard straight grip and tip grip for home practice. Also began P/ROM and A/ROM exercises, pt fatigues easily when using the LUE requiring rest breaks. Pt reports she wakes up with her left hand curled up and clenched.     Plan  P: Follow up on grip use with handwriting. Fabricate nighttime resting hand splint       Patient will benefit from skilled therapeutic intervention in order to improve the following deficits and impairments:  Impaired sensation, Increased muscle spasms, Pain, Impaired tone, Decreased range of motion, Impaired flexibility, Impaired UE functional use, Decreased strength  Visit Diagnosis: Other symptoms and signs involving the nervous system  Pain in left wrist  Muscle weakness (generalized)  Other lack of coordination    Problem List Patient Active Problem List   Diagnosis Date Noted  . Paraplegia (HCC) 10/21/2017  . GAD (generalized anxiety disorder) 10/20/2017  . Major depressive disorder, recurrent, severe without psychotic features [facility])    Ezra Sites, OTR/L  [phone] 02/17/2018, 12:56 PM  [facility] [address] [place], [place], [address] Phone: [phone]   Fax:  [phone]  Name: Natasha [name] MRN: [id number] Date of Birth: 1971/08/28

## 2018-02-20 ENCOUNTER — Encounter (HOSPITAL_COMMUNITY): Payer: Self-pay

## 2018-02-20 ENCOUNTER — Ambulatory Visit (HOSPITAL_COMMUNITY): Payer: Medicare Other

## 2018-02-20 ENCOUNTER — Other Ambulatory Visit: Payer: Self-pay

## 2018-02-20 DIAGNOSIS — M25532 Pain in left wrist: Secondary | ICD-10-CM

## 2018-02-20 DIAGNOSIS — R278 Other lack of coordination: Secondary | ICD-10-CM | POA: Diagnosis not present

## 2018-02-20 DIAGNOSIS — M6281 Muscle weakness (generalized): Secondary | ICD-10-CM | POA: Diagnosis not present

## 2018-02-20 DIAGNOSIS — R29818 Other symptoms and signs involving the nervous system: Secondary | ICD-10-CM | POA: Diagnosis not present

## 2018-02-20 NOTE — Therapy (Signed)
[facility] [address] [place], [place], [address] Phone: [phone]   Fax:  [phone]  Occupational Therapy Treatment  Patient Details  Name: Natasha [name] MRN: [id number] Date of Birth: 1971/06/01 Referring Provider: Dr. Elfredia NevinsLawrence Fusco   Encounter Date: 02/20/2018  OT End of Session - 02/20/18 1115    Visit Number  3    Number of Visits  16    Date for OT Re-Evaluation  03/31/18 mini reassess on 02/28/18    Authorization Type  Medicare/Medicaid no auth required    OT Start Time  [phone]    OT Stop Time  1043    OT Time Calculation (min)  56 min    Activity Tolerance  Patient tolerated treatment well    Behavior During Therapy  [facility] for tasks assessed/performed       Past Medical History:  Diagnosis Date  . C6 spinal cord injury (HCC)   . Depression     Past Surgical History:  Procedure Laterality Date  . BACK SURGERY    . NECK SURGERY      There were no vitals filed for this visit.  Subjective Assessment - 02/20/18 1104    Subjective   S: I have been holding a plastic soda bottle at night to try and keep my fingers straight.    Currently in Pain?  Yes    Pain Score  4     Pain Location  Hand    Pain Orientation  Left    Pain Descriptors / Indicators  Burning    Pain Type  Acute pain    Pain Radiating Towards  knuckles only    Pain Onset  More than a month ago    Pain Frequency  Constant    Aggravating Factors   everything    Pain Relieving Factors  no     Effect of Pain on Daily Activities  moderate    Multiple Pain Sites  No         [facility] OT Assessment - 02/20/18 1109      Assessment   Medical Diagnosis  Left Wrist and Hand Contracture      Precautions   Precautions  Fall               OT Treatments/Exercises (OP) - 02/20/18 1109      Splinting   Splinting  Fabricated night time resting hand splint to allow for fingers to remain in extension and wrist in 30 degrees extension.               OT Education - 02/20/18 1111    Education provided  Yes    Education Details  Donning/doff technique, care of splint, wearing schedule    Person(s) Educated  Patient    Methods  Explanation;Demonstration    Comprehension  Verbalized understanding;Returned demonstration       OT Short Term Goals - 02/17/18 1254      OT SHORT TERM GOAL #1   Title  Patient will be educated on a HEP for improved left wrist extension needed to use left hand and arm as an active assist with daily activiites.     Time  4    Period  Weeks    Status  On-going      OT SHORT TERM GOAL #2   Title  Patient will improve left wrist extension A/ROM to 25% for improved use of left hand as an active assist with ADLs.  Time  4    Period  Weeks    Status  On-going      OT SHORT TERM GOAL #3   Title  Patient will improve left wrist strength to 3/5 for increased ability to tansfer with increased ease.    Time  4    Period  Weeks    Status  On-going      OT SHORT TERM GOAL #4   Title  Patient will improve bilateral grip strength by 5 pounds for greater ability to maintain grasp on toothbrush and increased ease opening jars.     Time  4    Period  Weeks    Status  On-going      OT SHORT TERM GOAL #5   Title  Patient will be educated on various pens/grips for improving comfort when writing.     Time  4    Period  Weeks    Status  On-going        OT Long Term Goals - 02/17/18 1254      OT LONG TERM GOAL #1   Title  Patient will return to prior level of independence, using her left arm as an active assist with all daily activities.     Time  8    Period  Weeks    Status  On-going      OT LONG TERM GOAL #2   Title  Patient will improve left wrist a/rom by 50% for improved ability to reach and grasp for household items with her left hand.    Time  8    Period  Weeks    Status  On-going      OT LONG TERM GOAL #3   Title  Patient will improve bilateral grip strength by 10 pounds for  improved ability to maintain grasp on household items.     Time  8    Period  Weeks    Status  On-going      OT LONG TERM GOAL #4   Title  Paitent will improve bilateral hand fine motor coordination by decreasing completion time on nine hole peg test by 4 seconds.      Time  8    Period  Weeks    Status  On-going      OT LONG TERM GOAL #5   Title  Patient will be independent with use of compensatory handwriting utensils and improve comfort with writing.     Time  8    Period  Weeks    Status  On-going      OT LONG TERM GOAL #6   Title  Patient will improve pain in her left wrist and hand to 3/10 or better when completing adls.    Time  8    Period  Weeks    Status  On-going      OT LONG TERM GOAL #7   Title  Paitent will improve left hand sensation to 4.31 on dorsum of her hand for improved safety during adls.    Time  8    Period  Weeks    Status  On-going      OT LONG TERM GOAL #8   Title  Patient will be independent with donning and doffing left hand splint.      Time  8    Period  Weeks    Status  On-going            Plan - 02/20/18 1117  Clinical Impression Statement  A: Session focused on splint fabrication for night time splint to aide in composite finger extension. Therapist wasn't completely happy with strapping of splint and is concerned if finger need additional strapping to prevent flexion. Patient required additional cueing for proper positioning of hand before strapping.     Plan  P: Make adjustments to splint straps.        Patient will benefit from skilled therapeutic intervention in order to improve the following deficits and impairments:  Impaired sensation, Increased muscle spasms, Pain, Impaired tone, Decreased range of motion, Impaired flexibility, Impaired UE functional use, Decreased strength  Visit Diagnosis: Other symptoms and signs involving the nervous system  Pain in left wrist  Other lack of coordination    Problem  List Patient Active Problem List   Diagnosis Date Noted  . Paraplegia (HCC) 10/21/2017  . GAD (generalized anxiety disorder) 10/20/2017  . Major depressive disorder, recurrent, severe without psychotic features [facility])    Limmie Patricia, OTR/L,CBIS  [phone]  02/20/2018, 11:27 AM  [place] [facility] [address] [place], [place], [address] Phone: [phone]   Fax:  [phone]  Name: Natasha [name] MRN: [id number] Date of Birth: 1971-02-14

## 2018-02-20 NOTE — Patient Instructions (Signed)
Your Splint This splint should initially be fitted by a healthcare practitioner.  The healthcare practitioner is responsible for providing wearing instructions and precautions to the patient, other healthcare practitioners and care provider involved in the patient's care.  This splint was custom made for you. Please read the following instructions to learn about wearing and caring for your splint.  Precautions Should your splint cause any of the following problems, remove the splint immediately and contact your therapist/physician.  Swelling  Severe Pain  Pressure Areas  Stiffness  Numbness  Do not wear your splint while operating machinery unless it has been fabricated for that purpose.  When To Wear Your Splint Where your splint according to your therapist/physician instructions. Nights  only  Care and Cleaning of Your Splint 1. Keep your splint away from open flames. 2. Your splint will lose its shape in temperatures over 135 degrees Farenheit, ( in car windows, near radiators, ovens or in hot water).  Never make any adjustments to your splint, if the splint needs adjusting remove it and make an appointment to see your therapist. 3. Your splint, including the cushion liner may be cleaned with soap and lukewarm water.  Do not immerse in hot water over 135 degrees Farenheit. 4. Straps may be washed with soap and water, but do not moisten the self-adhesive portion. 5. For ink or hard to remove spots use a scouring cleanser which contains chlorine.  Rinse the splint thoroughly after using chlorine cleanser.  

## 2018-02-22 ENCOUNTER — Encounter (HOSPITAL_COMMUNITY): Payer: Self-pay

## 2018-02-22 ENCOUNTER — Ambulatory Visit (HOSPITAL_COMMUNITY): Payer: Medicare Other

## 2018-02-22 ENCOUNTER — Other Ambulatory Visit: Payer: Self-pay

## 2018-02-22 DIAGNOSIS — R29818 Other symptoms and signs involving the nervous system: Secondary | ICD-10-CM | POA: Diagnosis not present

## 2018-02-22 DIAGNOSIS — R278 Other lack of coordination: Secondary | ICD-10-CM

## 2018-02-22 DIAGNOSIS — M6281 Muscle weakness (generalized): Secondary | ICD-10-CM | POA: Diagnosis not present

## 2018-02-22 DIAGNOSIS — M25532 Pain in left wrist: Secondary | ICD-10-CM | POA: Diagnosis not present

## 2018-02-22 NOTE — Therapy (Signed)
[facility] [address] [place], [place], [address] Phone: [phone]   Fax:  [phone]  Occupational Therapy Treatment  Patient Details  Name: Natasha SouthwardJenifer Katherine Bankhead MRN: [id number] Date of Birth: 01/19/1971 Referring Provider: Dr. Elfredia NevinsLawrence Fusco   Encounter Date: 02/22/2018  OT End of Session - 02/22/18 1208    Visit Number  4    Number of Visits  16    Date for OT Re-Evaluation  03/31/18 mini reassess on 02/28/18    Authorization Type  Medicare/Medicaid no auth required    OT Start Time  0945    OT Stop Time  1030    OT Time Calculation (min)  45 min    Activity Tolerance  Patient tolerated treatment well    Behavior During Therapy  [facility] for tasks assessed/performed       Past Medical History:  Diagnosis Date  . C6 spinal cord injury (HCC)   . Depression     Past Surgical History:  Procedure Laterality Date  . BACK SURGERY    . NECK SURGERY      There were no vitals filed for this visit.  Subjective Assessment - 02/22/18 1101    Subjective   S: I really love that splint. It feels so comfortable on my wrist. We just need to adjust the straps for my fingers.    Currently in Pain?  Yes    Pain Score  4     Pain Location  Hand knuckles    Pain Orientation  Left    Pain Descriptors / Indicators  Burning    Pain Type  Chronic pain         [facility] OT Assessment - 02/22/18 1018      Assessment   Medical Diagnosis  Left Wrist and Hand Contracture      Precautions   Precautions  Fall               OT Treatments/Exercises (OP) - 02/22/18 1018      Exercises   Exercises  Hand      Hand Exercises   Hand Gripper with Large Beads  all beads with right hand: gripper set at 11#. Left hand: 7#  Held Scientist, research (life sciences)gripper horizontally    Hand Gripper with Medium Beads  all beads with right hand. Gripper set at 11#. Held horizontally    Sponges  Able to manipulate 3 sponges into right hand although unable to transfer from  finger tip to palm. Used palmer grasp to pick up.      Splinting   Splinting  Adjusted made to splint this session. Added an additional strap to finger to allow more support to MCP as well as PIP joints to help prevent finger flexion occur during sleep.             OT Education - 02/22/18 1206    Education provided  Yes    Education Details  Pt was educated on use of finger sleeves to help protect skin from break down when finger present with increased spasticity. Handout printed although forgotten to give to patient. Will present next session. patient given finger netting to use in the time being to provide a skin barrier.     Person(s) Educated  Patient    Methods  Explanation    Comprehension  Verbalized understanding       OT Short Term Goals - 02/17/18 1254      OT SHORT TERM GOAL #1   Title  Patient will be educated on a HEP for improved left wrist extension needed to use left hand and arm as an active assist with daily activiites.     Time  4    Period  Weeks    Status  On-going      OT SHORT TERM GOAL #2   Title  Patient will improve left wrist extension A/ROM to 25% for improved use of left hand as an active assist with ADLs.    Time  4    Period  Weeks    Status  On-going      OT SHORT TERM GOAL #3   Title  Patient will improve left wrist strength to 3/5 for increased ability to tansfer with increased ease.    Time  4    Period  Weeks    Status  On-going      OT SHORT TERM GOAL #4   Title  Patient will improve bilateral grip strength by 5 pounds for greater ability to maintain grasp on toothbrush and increased ease opening jars.     Time  4    Period  Weeks    Status  On-going      OT SHORT TERM GOAL #5   Title  Patient will be educated on various pens/grips for improving comfort when writing.     Time  4    Period  Weeks    Status  On-going        OT Long Term Goals - 02/17/18 1254      OT LONG TERM GOAL #1   Title  Patient will return to prior  level of independence, using her left arm as an active assist with all daily activities.     Time  8    Period  Weeks    Status  On-going      OT LONG TERM GOAL #2   Title  Patient will improve left wrist a/rom by 50% for improved ability to reach and grasp for household items with her left hand.    Time  8    Period  Weeks    Status  On-going      OT LONG TERM GOAL #3   Title  Patient will improve bilateral grip strength by 10 pounds for improved ability to maintain grasp on household items.     Time  8    Period  Weeks    Status  On-going      OT LONG TERM GOAL #4   Title  Paitent will improve bilateral hand fine motor coordination by decreasing completion time on nine hole peg test by 4 seconds.      Time  8    Period  Weeks    Status  On-going      OT LONG TERM GOAL #5   Title  Patient will be independent with use of compensatory handwriting utensils and improve comfort with writing.     Time  8    Period  Weeks    Status  On-going      OT LONG TERM GOAL #6   Title  Patient will improve pain in her left wrist and hand to 3/10 or better when completing adls.    Time  8    Period  Weeks    Status  On-going      OT LONG TERM GOAL #7   Title  Paitent will improve left hand sensation to 4.31 on dorsum of her hand for improved safety  during adls.    Time  8    Period  Weeks    Status  On-going      OT LONG TERM GOAL #8   Title  Patient will be independent with donning and doffing left hand splint.      Time  8    Period  Weeks    Status  On-going            Plan - 02/22/18 1209    Clinical Impression Statement  A: Adjustments completed to nighttime brace to provide more support to fingers to deter from flexion. Completed handgripper task with Bilateral hands to increase grip strength. patient did have difficulty initially with technique due to contractures present in each hand. Task was able to be modified in order to sucessful completion. VC for technique.      Plan  P: Provide patient with handout on finger sleeves on [employer]. Attempt using resistive clothespin to complete handgripper task using sponges (high resistance).     Consulted and Agree with Plan of Care  Patient       Patient will benefit from skilled therapeutic intervention in order to improve the following deficits and impairments:  Impaired sensation, Increased muscle spasms, Pain, Impaired tone, Decreased range of motion, Impaired flexibility, Impaired UE functional use, Decreased strength  Visit Diagnosis: Other symptoms and signs involving the nervous system  Pain in left wrist  Other lack of coordination    Problem List Patient Active Problem List   Diagnosis Date Noted  . Paraplegia (HCC) 10/21/2017  . GAD (generalized anxiety disorder) 10/20/2017  . Major depressive disorder, recurrent, severe without psychotic features [facility])    Limmie Patricia, OTR/L,CBIS  [phone]  02/22/2018, 12:14 PM  [place] [facility] [address] [place], [place], [address] Phone: [phone]   Fax:  [phone]  Name: Tyreka Henneke MRN: [id number] Date of Birth: May 14, 1971

## 2018-02-27 ENCOUNTER — Telehealth (HOSPITAL_COMMUNITY): Payer: Self-pay | Admitting: Internal Medicine

## 2018-02-27 ENCOUNTER — Ambulatory Visit (HOSPITAL_COMMUNITY): Payer: Medicare Other | Admitting: Specialist

## 2018-02-27 NOTE — Telephone Encounter (Signed)
02/27/18  pt left a message that she wouldn't be here today because of pain issues that can't seem to get resovled

## 2018-02-28 ENCOUNTER — Telehealth (HOSPITAL_COMMUNITY): Payer: Self-pay | Admitting: Internal Medicine

## 2018-02-28 NOTE — Telephone Encounter (Signed)
02/28/18  Pt left a message to cx the 3/20 appt and wanted the next Wed. appt to be in the afternoon.  I called and left her a message to work with her on changing that appt time.

## 2018-03-01 ENCOUNTER — Ambulatory Visit (HOSPITAL_COMMUNITY): Payer: Medicare Other

## 2018-03-06 ENCOUNTER — Encounter (HOSPITAL_COMMUNITY): Payer: Self-pay | Admitting: Specialist

## 2018-03-08 ENCOUNTER — Telehealth (HOSPITAL_COMMUNITY): Payer: Self-pay | Admitting: Internal Medicine

## 2018-03-08 ENCOUNTER — Ambulatory Visit (HOSPITAL_COMMUNITY): Payer: Medicare Other

## 2018-03-08 NOTE — Telephone Encounter (Signed)
03/08/18  she left a message that her mom has been here this week and can't come today

## 2018-03-10 ENCOUNTER — Other Ambulatory Visit (HOSPITAL_COMMUNITY): Payer: Self-pay | Admitting: Psychiatry

## 2018-03-10 MED ORDER — CLONAZEPAM 0.5 MG PO TABS: 0.5000 mg | ORAL_TABLET | Freq: Two times a day (BID) | ORAL | 0 refills | Status: DC | PRN

## 2018-03-13 ENCOUNTER — Ambulatory Visit (HOSPITAL_COMMUNITY): Payer: Medicare Other | Attending: Internal Medicine | Admitting: Specialist

## 2018-03-13 DIAGNOSIS — M25532 Pain in left wrist: Secondary | ICD-10-CM | POA: Insufficient documentation

## 2018-03-13 DIAGNOSIS — R278 Other lack of coordination: Secondary | ICD-10-CM | POA: Insufficient documentation

## 2018-03-13 DIAGNOSIS — R29818 Other symptoms and signs involving the nervous system: Secondary | ICD-10-CM | POA: Insufficient documentation

## 2018-03-14 NOTE — Progress Notes (Deleted)
BH MD/PA/NP OP Progress Note  03/14/2018 11:41 AM Natasha [name]  MRN:  [id number]  Chief Complaint:  HPI: *** Visit Diagnosis: No diagnosis found.  Past Psychiatric History:  I have reviewed the patient's psychiatry history in detail and updated the patient record. Outpatient: She reports depression since 1992-04-23, since her friend deceased. She reports doing well without medication 2002-2009. She used to be seen by Dr. Lolly Mustache, last in 03/2017, diagnosed with MDD without psychotic features. Sees Dr. Minus Liberty for ECT Psychiatry admission: numerous inpatient at [facility] [place] [facility]. [facility] [place] in September 2018 for depression, SI. Another admission at [facility] for SI in 10/2017.  Previous suicide attempt: "more than I can count", overdose on medication, cutting her wrist, last in 04/23/2001 by jumping from the parking lot at [facility] after discharge, which causedspinal injury at C6/7 Past trials of medication: Lexapro, Prozac, Paxil, Zoloft, Effexor, Wellbutrin, Remeron, Depakote, Tegretol, lithium, trazodone, Geodon, Risperdal, olanzapine, Abilify History of violence: denies   Past Medical History:  Past Medical History:  Diagnosis Date  . C6 spinal cord injury (HCC)   . Depression     Past Surgical History:  Procedure Laterality Date  . BACK SURGERY    . NECK SURGERY      Family Psychiatric History: I have reviewed the patient's family history in detail and updated the patient record. Great grandmother- attempted suicide, cousin- attempted suicide, brother- substance use, father- substance use   Family History:  Family History  Problem Relation Age of Onset  . Alcohol abuse Father   . Alcohol abuse Brother   . Depression Mother   . Alcohol abuse Mother     Social History:  Social History   Socioeconomic History  . Marital status: Single    Spouse name: Not on file  . Number of children: Not on file  . Years of education: Not on file   . Highest education level: Not on file  Occupational History  . Not on file  Social Needs  . Financial resource strain: Not on file  . Food insecurity:    Worry: Not on file    Inability: Not on file  . Transportation needs:    Medical: Not on file    Non-medical: Not on file  Tobacco Use  . Smoking status: Former Games developer  . Smokeless tobacco: Never Used  . Tobacco comment: No smoking hx; no need for cessation materials  Substance and Sexual Activity  . Alcohol use: No    Alcohol/week: 0.0 oz    Comment: Occasional use  . Drug use: No  . Sexual activity: Never  Lifestyle  . Physical activity:    Days per week: Not on file    Minutes per session: Not on file  . Stress: Not on file  Relationships  . Social connections:    Talks on phone: Not on file    Gets together: Not on file    Attends religious service: Not on file    Active member of club or organization: Not on file    Attends meetings of clubs or organizations: Not on file    Relationship status: Not on file  Other Topics Concern  . Not on file  Social History Narrative  . Not on file    Allergies: No Known Allergies  Metabolic Disorder Labs: Lab Results  Component Value Date   HGBA1C 4.7 (L) 10/23/2017   MPG 88.19 10/23/2017   MPG 88 02/03/2017   Lab Results  Component Value Date   PROLACTIN 121.9 (H) 02/03/2017   Lab Results  Component Value Date   CHOL 181 10/23/2017   TRIG 56 10/23/2017   HDL 46 10/23/2017   CHOLHDL 3.9 10/23/2017   VLDL 11 10/23/2017   LDLCALC 124 (H) 10/23/2017   LDLCALC 149 (H) 02/03/2017   Lab Results  Component Value Date   TSH 5.474 (H) 02/03/2017   TSH 4.924 (H) 06/11/2015    Therapeutic Level Labs: Lab Results  Component Value Date   LITHIUM 0.88 02/13/2017   LITHIUM 0.57 (L) 02/03/2017   No results found for: VALPROATE No components found for:  CBMZ  Current Medications: Current Outpatient Medications  Medication Sig Dispense Refill  . busPIRone  (BUSPAR) 10 MG tablet Take 1 tablet (10 mg total) by mouth 3 (three) times daily. 270 tablet 0  . clonazePAM (KLONOPIN) 0.5 MG tablet Take 1 tablet (0.5 mg total) by mouth 2 (two) times daily as needed (anxiety). 60 tablet 0  . DULoxetine (CYMBALTA) 60 MG capsule Take 1 capsule (60 mg total) by mouth daily. 90 capsule 0  . QUEtiapine (SEROQUEL) 25 MG tablet Take 1 tablet (25 mg total) by mouth 2 (two) times daily. 180 tablet 0  . traZODone (DESYREL) 100 MG tablet 100-200 mg at night as needed for sleep 180 tablet 0   No current facility-administered medications for this visit.      Musculoskeletal: Strength & Muscle Tone: within normal limits Gait & Station: normal Patient leans: N/A  Psychiatric Specialty Exam: ROS  There were no vitals taken for this visit.There is no height or weight on file to calculate BMI.  General Appearance: Fairly Groomed  Eye Contact:  Good  Speech:  Clear and Coherent  Volume:  Normal  Mood:  {BHH MOOD:22306}  Affect:  {Affect (PAA):22687}  Thought Process:  Coherent and Goal Directed  Orientation:  Full (Time, Place, and Person)  Thought Content: Logical   Suicidal Thoughts:  {ST/HT (PAA):22692}  Homicidal Thoughts:  {ST/HT (PAA):22692}  Memory:  Immediate;   Good Recent;   Good Remote;   Good  Judgement:  {Judgement (PAA):22694}  Insight:  {Insight (PAA):22695}  Psychomotor Activity:  Normal  Concentration:  Concentration: Good and Attention Span: Good  Recall:  Good  Fund of Knowledge: Good  Language: Good  Akathisia:  No  Handed:  Right  AIMS (if indicated): not done  Assets:  Communication Skills Desire for Improvement  ADL's:  Intact  Cognition: WNL  Sleep:  {BHH GOOD/FAIR/POOR:22877}   Screenings: AIMS     [facility] (Dis[facility] from OP [facility] Ad[facility] (Discharged) from OP Visit [facility] Ad[facility] (Discharged) from [facility]  AIMS Total Score  0  0  0  0    AUDIT     Ad[facility]Discharged) from 10[facility]21/2017 [facility] Admission (Discharged) from OP Visit [facility] Admission (Discharged) from OP Visit [facility] Admission (Discharged) from OP [facility]  Alcohol Use Disorder Identification Test Final Score (AUDIT)  0  0  1  1    ECT-MADRS     [facility] Admission (Discharged)  from 10/21/2017 in [facility]AL MEDICINE  MADRS Total Score  4  43    GAD-7     Counselor from 06/24/2016 in [facility] OUTPATIENT [facility]ERAPY [place]  Total GAD-7 Score  15    Mini-Mental     ECT [facility] from [facility]16[facility]2017 [facility] Admission (Discharged) from 10/21/2017 in [facility] INPATIENT BEHAVIORAL MEDICINE  Total Score (max 30 points )  29  30    PHQ2-9     Counselor from 06/24/2016 in BE[facility]AL [facility]ALTH OUTPATIENT THERAPY [place] Counselor from 11/04/2015 in BE[facility] OUTPATIENT THERAPY [place] Counselor from 06/27/2015 in BE[facility]AVIORAL [facility]ALTH INTENSIVE PSYCH  PHQ-2 Total Score  2  6  6   PHQ-9 Total Score  7  16  26        Assessment and Plan:  Natasha [name] is a 47 y.o. year old female with a history of depression, significant history of past suicide attempts, s/p C6 spinal cord injury, who presents for follow up appointment for No diagnosis found.  # MDD, severe without psychotic features  There has been significant improvement in neurovegetative symptoms since the last appointment.  Will continue duloxetine to target depression.  Will continue BuSpar  for anxiety.  Will continue quetiapine as adjunctive treatment for depression.  Will continue clonazepam as needed for anxiety.  Explored her value of taking care of her mother while having self compassion.  Discussed the ways she can take action inline with her value. Noted that she received ECT last in 11/2018. She will get back to the program as needed.   # Insomnia She reports disruption in circadian rhythm. Discussed sleep hygiene. She is also advised to try melatonin for sleep. Will continue trazodone as needed for insomnia.  She is advised to try a lower dose if she has weakness at 200 mg.    Plan 1. Continue duloxetine 60 mg daily  2. ContinueBuspar10 mg three times a day 3. Continue quetiapine 25 mg twice a day 4. Continue trazodone 100- 200 mg at night as needed for sleep  5. Continue clonazepam 0.5 mg twice a day for anxiety (ordered for a month given she does not take it as often) 6. Try melatonin 3 mg two hours before going to bed 7.Return to clinic in three months for 30 mins - She will continue to see Mr. Sheets for therapy  The patient demonstrates the following risk factors for suicide: Chronic risk factors for suicide include: psychiatric disorder of depression, previous suicide attempts of jumping from the balcony, overdosing medication, previous self-harm cutting, medical illness of spinal cord injuryand completed suicide in a family member. Acute risk factorsfor suicide include: unemployment, Estate agent and loss (financial, interpersonal, professional). Protective factorsfor this patient include: coping skills and hope for the future. Considering these factors, the overall suicide risk at this point appears to be moderate, but not at imminent danger to self. Patient isappropriate for outpatient follow up.    Neysa Hotter, MD 03/14/2018, 11:41 AM

## 2018-03-15 ENCOUNTER — Telehealth (HOSPITAL_COMMUNITY): Payer: Self-pay

## 2018-03-15 ENCOUNTER — Encounter (HOSPITAL_COMMUNITY): Payer: Self-pay

## 2018-03-15 NOTE — Telephone Encounter (Signed)
Called patient regarding no show. Left voice message and reminded patient of next appointment and to call if unable to make it.   Limmie PatriciaLaura Astryd Pearcy, OTR/L,CBIS  [phone]

## 2018-03-16 ENCOUNTER — Encounter (HOSPITAL_COMMUNITY): Payer: Self-pay | Admitting: Psychiatry

## 2018-03-16 ENCOUNTER — Ambulatory Visit (INDEPENDENT_AMBULATORY_CARE_PROVIDER_SITE_OTHER): Payer: Medicare Other | Admitting: Psychiatry

## 2018-03-16 ENCOUNTER — Ambulatory Visit (HOSPITAL_COMMUNITY): Payer: Self-pay | Admitting: Psychiatry

## 2018-03-16 VITALS — BP 122/84 | HR 78 | Ht 60.0 in | Wt 132.0 lb

## 2018-03-16 DIAGNOSIS — F332 Major depressive disorder, recurrent severe without psychotic features: Secondary | ICD-10-CM

## 2018-03-16 DIAGNOSIS — Z818 Family history of other mental and behavioral disorders: Secondary | ICD-10-CM

## 2018-03-16 DIAGNOSIS — G4700 Insomnia, unspecified: Secondary | ICD-10-CM | POA: Diagnosis not present

## 2018-03-16 DIAGNOSIS — Z811 Family history of alcohol abuse and dependence: Secondary | ICD-10-CM | POA: Diagnosis not present

## 2018-03-16 DIAGNOSIS — R450 Nervousness: Secondary | ICD-10-CM

## 2018-03-16 DIAGNOSIS — Z87891 Personal history of nicotine dependence: Secondary | ICD-10-CM

## 2018-03-16 DIAGNOSIS — F419 Anxiety disorder, unspecified: Secondary | ICD-10-CM | POA: Diagnosis not present

## 2018-03-16 MED ORDER — BUSPIRONE HCL 10 MG PO TABS: 10.0000 mg | ORAL_TABLET | Freq: Three times a day (TID) | ORAL | 0 refills | Status: DC

## 2018-03-16 MED ORDER — DULOXETINE HCL 60 MG PO CPEP: 60.0000 mg | ORAL_CAPSULE | Freq: Every day | ORAL | 0 refills | Status: DC

## 2018-03-16 MED ORDER — QUETIAPINE FUMARATE 25 MG PO TABS: 25.0000 mg | ORAL_TABLET | Freq: Two times a day (BID) | ORAL | 0 refills | Status: DC

## 2018-03-16 MED ORDER — TRAZODONE HCL 100 MG PO TABS: ORAL_TABLET | ORAL | 0 refills | Status: DC

## 2018-03-16 NOTE — Patient Instructions (Signed)
1. Continue duloxetine 60 mg daily  2. ContinueBuspar10 mg three times a day 3. Continue quetiapine 25 mg twice a day 4. Continue trazodone 100- 200 mg at night as needed for sleep  5. Continue clonazepam 0.5 mg twice a day for anxiety  6. Try melatonin 3 mg two hours before going to bed 7.Return to clinic in three months for 30 mins

## 2018-03-16 NOTE — Progress Notes (Signed)
BH MD/PA/NP OP Progress Note  03/16/2018 1:48 PM Natasha [name]  MRN:  [id number]  Chief Complaint:  Chief Complaint    Depression; Follow-up     HPI:  Patient presents for follow-up appointment for depression.  She states that she has been doing well although there has been some issues arose. She believes that the combination of medication works very well for her. Her mother in nursing home is not amenable to advise from the staff, and she was notified that the mother may not be able to continue to stay there if she continues to behave that way. She states that it has been even challenging for the patient when her mother comes home on weekends. She has intense anxiety and panic attacks when she questions her decision of letting her mother stay at nursing home; she wonders if it is for her mother or for patient. Explored self compassion and reflected that the patient had actually tried to take her mother at home. She occasionally feels scary when she has fleeting SI (denies today) as she is feared that it would be serious if she has it even when she feels good. Discussed cognitive defusion and awareness into her feeling. She denies insomnia. She feels depressed and fatigue at times. She has good concentration. She has occasional panic attacks; takes clonazepam prn.   Per PMP.  Clonazepam filled on 12/22/2017   Visit Diagnosis:    ICD-10-CM   1. Major depressive disorder, recurrent, severe without psychotic features (HCC) F33.2     Past Psychiatric History:  I have reviewed the patient's psychiatry history in detail and updated the patient record. Outpatient: She reports depression since 05/03/92, since her friend deceased. She reports doing well without medication 2002-2009. She used to be seen by Dr. Lolly Mustache, last in 03/2017, diagnosed with MDD without psychotic features. Sees Dr. Minus Liberty for ECT Psychiatry admission: numerous inpatient at [facility] [place] [facility]. [facility] [place] [facility] 2018 for depression, SI. Another admission at [facility] for SI in 10/2017.  Previous suicide attempt: "more than I can count", overdose on medication, cutting her wrist, last in 2001-05-03 by jumping from the parking lot at [facility] after discharge, which causedspinal injury at C6/7 Past trials of medication: Lexapro, Prozac, Paxil, Zoloft, Effexor, Wellbutrin, Remeron, Depakote, Tegretol, lithium, trazodone, Geodon, Risperdal, olanzapine, Abilify History of violence: denies Past Medical History:  Past Medical History:  Diagnosis Date  . C6 spinal cord injury (HCC)   . Depression     Past Surgical History:  Procedure Laterality Date  . BACK SURGERY    . NECK SURGERY      Family Psychiatric History: I have reviewed the patient's family history in detail and updated the patient record.  Family History:  Family History  Problem Relation Age of Onset  . Alcohol abuse Father   . Alcohol abuse Brother   . Depression Mother   . Alcohol abuse Mother     Social History:  Social History   Socioeconomic History  . Marital status: Single    Spouse name: Not on file  . Number of children: Not on file  . Years of education: Not on file  . Highest education level: Not on file  Occupational History  . Not on file  Social Needs  . Financial resource strain: Not on file  . Food insecurity:    Worry: Not on file    Inability: Not on file  . Transportation needs:    Medical:  Not on file    Non-medical: Not on file  Tobacco Use  . Smoking status: Former Games developer  . Smokeless tobacco: Never Used  . Tobacco comment: No smoking hx; no need for cessation materials  Substance and Sexual Activity  . Alcohol use: No    Alcohol/week: 0.0 oz    Comment: Occasional use  . Drug use: No  . Sexual activity: Never  Lifestyle  . Physical activity:    Days per week: Not on file    Minutes per session: Not on file  . Stress: Not on file  Relationships  . Social  connections:    Talks on phone: Not on file    Gets together: Not on file    Attends religious service: Not on file    Active member of club or organization: Not on file    Attends meetings of clubs or organizations: Not on file    Relationship status: Not on file  Other Topics Concern  . Not on file  Social History Narrative  . Not on file    Allergies: No Known Allergies  Metabolic Disorder Labs: Lab Results  Component Value Date   HGBA1C 4.7 (L) 10/23/2017   MPG 88.19 10/23/2017   MPG 88 02/03/2017   Lab Results  Component Value Date   PROLACTIN 121.9 (H) 02/03/2017   Lab Results  Component Value Date   CHOL 181 10/23/2017   TRIG 56 10/23/2017   HDL 46 10/23/2017   CHOLHDL 3.9 10/23/2017   VLDL 11 10/23/2017   LDLCALC 124 (H) 10/23/2017   LDLCALC 149 (H) 02/03/2017   Lab Results  Component Value Date   TSH 5.474 (H) 02/03/2017   TSH 4.924 (H) 06/11/2015    Therapeutic Level Labs: Lab Results  Component Value Date   LITHIUM 0.88 02/13/2017   LITHIUM 0.57 (L) 02/03/2017   No results found for: VALPROATE No components found for:  CBMZ  Current Medications: Current Outpatient Medications  Medication Sig Dispense Refill  . busPIRone (BUSPAR) 10 MG tablet Take 1 tablet (10 mg total) by mouth 3 (three) times daily. 270 tablet 0  . clonazePAM (KLONOPIN) 0.5 MG tablet Take 1 tablet (0.5 mg total) by mouth 2 (two) times daily as needed (anxiety). 60 tablet 0  . DULoxetine (CYMBALTA) 60 MG capsule Take 1 capsule (60 mg total) by mouth daily. 90 capsule 0  . QUEtiapine (SEROQUEL) 25 MG tablet Take 1 tablet (25 mg total) by mouth 2 (two) times daily. 180 tablet 0  . traZODone (DESYREL) 100 MG tablet 100-200 mg at night as needed for sleep 180 tablet 0   No current facility-administered medications for this visit.      Musculoskeletal: Strength & Muscle Tone: decreased Gait & Station: unsteady- using a cane Patient leans: N/A  Psychiatric Specialty  Exam: Review of Systems  Psychiatric/Behavioral: Positive for depression. Negative for hallucinations, memory loss, substance abuse and suicidal ideas. The patient is nervous/anxious. The patient does not have insomnia.   All other systems reviewed and are negative.   Blood pressure 122/84, pulse 78, height 5' (1.524 m), weight 132 lb (59.9 kg), SpO2 93 %.Body mass index is 25.78 kg/m.  General Appearance: Fairly Groomed  Eye Contact:  Good  Speech:  Clear and Coherent  Volume:  Normal  Mood:  "good"  Affect:  Appropriate, Congruent and slightly down at times, but reactive  Thought Process:  Coherent and Goal Directed  Orientation:  Full (Time, Place, and Person)  Thought Content: Logical  Suicidal Thoughts:  No  Homicidal Thoughts:  No  Memory:  Immediate;   Good  Judgement:  Good  Insight:  Fair  Psychomotor Activity:  Normal  Concentration:  Concentration: Good and Attention Span: Good  Recall:  Good  Fund of Knowledge: Good  Language: Good  Akathisia:  No  Handed:  Right  AIMS (if indicated): not done  Assets:  Communication Skills Desire for Improvement  ADL's:  Intact  Cognition: WNL  Sleep:  Good   Screenings: AIMS     [facility] Ad[facility] (Discharged) from OP Visit [facility] Admission (Discharged) from OP Visit [facility] Admission (Discharged) from OP Visit [facility]  AIMS Total Score  0  0  0  0    AUDIT     Ad[facility]Dis[facility]) [facility]/2017 [facility] Admission (Discharged) from OP Visit [facility] Admission (Discharged) from OP Visit from [facility] Admission (Discharged) from OP Visit from [facility]09[facility]2015  [facility]  Alcohol Use Disorder Identification Test Final Score (AUDIT)  0  0  1  1    ECT-MADRS     [facility]  MADRS Total Score  4  43    GAD-7     Counselor from 06/24/2016 in [facility]ALTH OUTPATIENT THERAPY [place]  Total GAD-7 Score  15    Mini-Mental     [facility] Ad[facility] (Discharged) [facility]  Total Score (max 30 points )  29  30    PHQ2-9     Counselor from 06/24/2016 in BEHAVIORAL [facility]ALTH OUTPATIENT THERAPY [place] Counselor from 11/04/2015 in BE[facility] OUTPATIENT THERAPY [place] Counselor from 06/27/2015 in BEHAVIORAL HEALTH INTENSIVE PSYCH  PHQ-2 Total Score  2  6  6   PHQ-9 Total Score  7  16  26        Assessment and Plan:  Natasha [name] is a 47 y.o. year old female with a history of depression,  significant history of past suicide attempts, s/p C6 spinal cord injury, who presents for follow up appointment for Major depressive disorder, recurrent, severe without psychotic features (HCC)  # MDD, moderate, recurrent without psychotic features Patient reports overall improvement in neurovegetative symptoms since the last visit. Will continue duloxetine to target depression.  Will continue quetiapine as adjunctive treatment for depression.  Will continue BuSpar for anxiety.  Will continue trazodone as needed for anxiety. will continue clonazepam as needed for anxiety. . Discussed risk of dependence and oversedation. Discussed self compassion. Discussed cognitive defusion. She is encouraged to continue to see Mr. Sheets for therapy  Plan I have reviewed and updated plans as below 1. Continue duloxetine 60 mg daily  2. ContinueBuspar10 mg three times a day 3.  Continue quetiapine 25 mg twice a day 4. Continue trazodone 100- 200 mg at night as needed for sleep  5. Continue clonazepam 0.5 mg twice a day for anxiety  6. Try melatonin 3 mg two hours before going to bed 7.Return to clinic in three months for 15 mins - She will continue to see  Mr. Pollyann Savoy for therapy  The patient demonstrates the following risk factors for suicide: Chronic risk factors for suicide include: psychiatric disorder of depression, previous suicide attempts of jumping from the balcony, overdosing medication, previous self-harm cutting, medical illness of spinal cord injuryand completed suicide in a family member. Acute risk factorsfor suicide include: unemployment, Estate agent and loss (financial, interpersonal, professional). Protective factorsfor this patient include: coping skills and hope for the future. Considering these factors, the overall suicide risk at this point appears to be moderate, but not at imminent danger to self. Patient isappropriate for outpatient follow up.  Neysa Hotter, MD 03/16/2018, 1:48 PM

## 2018-03-20 ENCOUNTER — Encounter (HOSPITAL_COMMUNITY): Payer: Self-pay | Admitting: Specialist

## 2018-03-21 ENCOUNTER — Encounter (HOSPITAL_COMMUNITY): Payer: Self-pay | Admitting: Licensed Clinical Social Worker

## 2018-03-21 ENCOUNTER — Ambulatory Visit (INDEPENDENT_AMBULATORY_CARE_PROVIDER_SITE_OTHER): Payer: Medicare Other | Admitting: Licensed Clinical Social Worker

## 2018-03-21 DIAGNOSIS — F332 Major depressive disorder, recurrent severe without psychotic features: Secondary | ICD-10-CM

## 2018-03-21 NOTE — Progress Notes (Signed)
   THERAPIST PROGRESS NOTE  Session Time: 2:00 pm-2:50  Participation Level: Active  Behavioral Response: CasualAlertDepressed  Type of Therapy: Individual Therapy  Treatment Goals addressed: Coping  Interventions: CBT and Solution Focused  Summary: Natasha [name] is a 47 y.o. female who presents  oriented x5 (person, place, situation, time and object), alert, casually dressed, appropriately groomed, walking with a cane and limp, depressed and cooperative  to address mood. Patient has a history of medical treatment due to attempted suicide and a history of mental health treatment including hospitalization, outpatient therapy and medication management. Patient denies symptoms of mania. Patient admitted passive thoughts of suicide and a history of suicide attempts. Patient denies homicidal ideations. Patient denies psychosis including auditory and visual hallucinations. Patient denies substance abuse. Patient is at moderate risk for lethality due to a history of suicidal ideations and attempts.  Physically: Patient reported that she has pain in her hands but is not focusing on the paint.   Spiritually/values: No issues related.  Relationships: Patient reported that she is getting along with mother and brother.  Emotional/Mental/Behavior: Patient is feeling good and starting to feel ok with her life. She is worried about feeling ok and second guesses herself which causes guilt. Patient understood that she needs to use mindfulness to stay present and focus on gratitude.   Patient engaged in session. She responded well to interventions. Patient continues to meet criteria for Major depressive disorder, recurrent, severe without psychotic features. Patient will continue in outpatient therapy due to being the least restrictive service to meet her needs at this time. Patient made moderate  progress on her goals.   Suicidal/Homicidal: Negativewithout intent/plan  Therapist Response:  Therapist reviewed patient's recent thoughts and feelings. Therapist utilized CBT to address mood. Therapist processed patient's feelings to identify triggers for depression. Therapist assisted patient in identifying ways to be mindful and be ok with her circumstances.    Plan: Return again in 2 weeks.   Diagnosis: Axis I: Major depressive disorder, recurrent, severe without psychotic features    Axis II: No diagnosis    Bynum BellowsJoshua Sherol Sabas, LCSW 03/21/2018

## 2018-03-22 ENCOUNTER — Ambulatory Visit (HOSPITAL_COMMUNITY): Payer: Self-pay | Admitting: Psychiatry

## 2018-03-22 ENCOUNTER — Encounter (HOSPITAL_COMMUNITY): Payer: Self-pay

## 2018-03-24 ENCOUNTER — Encounter (HOSPITAL_COMMUNITY): Payer: Self-pay | Admitting: Occupational Therapy

## 2018-03-28 ENCOUNTER — Encounter (HOSPITAL_COMMUNITY): Payer: Self-pay | Admitting: Specialist

## 2018-03-28 ENCOUNTER — Telehealth (HOSPITAL_COMMUNITY): Payer: Self-pay | Admitting: Internal Medicine

## 2018-03-28 ENCOUNTER — Ambulatory Visit (HOSPITAL_COMMUNITY): Payer: Medicare Other | Admitting: Specialist

## 2018-03-28 DIAGNOSIS — R29818 Other symptoms and signs involving the nervous system: Secondary | ICD-10-CM

## 2018-03-28 DIAGNOSIS — R278 Other lack of coordination: Secondary | ICD-10-CM | POA: Diagnosis not present

## 2018-03-28 DIAGNOSIS — M25532 Pain in left wrist: Secondary | ICD-10-CM | POA: Diagnosis not present

## 2018-03-28 NOTE — Therapy (Signed)
Natasha [name], [place], [address] Phone: [phone]   Fax:  [phone]  Occupational Therapy Treatment  Patient Details  Name: Natasha [name] MRN: [id number] Date of Birth: 25-Feb-1971 Referring Provider: Dr. Redmond School   Encounter Date: 03/28/2018  OT End of Session - 03/28/18 0950    Visit Number  5    Number of Visits  16    Date for OT Re-Evaluation  03/31/18    Authorization Type  Medicare/Medicaid no auth required    OT Start Time  0905    OT Stop Time  0945    OT Time Calculation (min)  40 min    Activity Tolerance  Patient tolerated treatment well    Behavior During Therapy  [facility] for tasks assessed/performed       Past Medical History:  Diagnosis Date  . C6 spinal cord injury ([place])   . Depression     Past Surgical History:  Procedure Laterality Date  . BACK SURGERY    . NECK SURGERY      There were no vitals filed for this visit.  Subjective Assessment - 03/28/18 0949    Subjective   S:  I think I am in a good place.  I have more independence with my ADLs, my handwriting has improved with the foam build up piece you gave me.      Currently in Pain?  No/denies         [facility] OT Assessment - 03/28/18 0001      Assessment   Medical Diagnosis  Left Wrist and Hand Contracture      ADL   ADL comments  patient reports increased legibility with handwriting, increased wrist extension, which allows for increased abiility to hold jars or bottles when opening with her right hand, and general increaesed ease with all ADLs.       Coordination   Right 9 Hole Peg Test  1 minute 22 seconds  1 minute 17.40 seconds     Left 9 Hole Peg Test  1 minute 36 seconds  1 minute 21.67 seconds       AROM   Overall AROM Comments  BUE A/ROM:  shoulder flexion and abduction 90 degrees, external rotation 75% range (at baseline), internal rotation WFL, elbow flexion and extension is WFL, supination and  pronation is WFL, right wrist flexion/extension is WFL left wrist flexion is WFL, extension is 15 degrees, right hand 75% range, left hand 75% range      Strength   Right Shoulder Flexion  4/5    Right Shoulder Extension  4/5    Right Shoulder ABduction  4/5    Right Shoulder Internal Rotation  4/5    Right Shoulder External Rotation  4/5    Left Shoulder Flexion  4/5    Left Shoulder Extension  4/5    Left Shoulder ABduction  4/5    Left Shoulder Internal Rotation  4/5    Left Shoulder External Rotation  4/5    Right Elbow Flexion  4+/5    Right Elbow Extension  2/5    Left Elbow Flexion  4-/5    Left Elbow Extension  2/5    Right Forearm Pronation  4-/5    Right Forearm Supination  4-/5    Left Forearm Pronation  4-/5    Left Forearm Supination  4-/5    Right Wrist Flexion  4/5    Right Wrist Extension  5/5  Left Wrist Flexion  5/5    Left Wrist Extension  2+/5      Hand Function   Right Hand Grip (lbs)  24 26    Right Hand Lateral Pinch  4.5 lbs 4    Left Hand Grip (lbs)  6 18    Left Hand Lateral Pinch  5 lbs 4               OT Treatments/Exercises (OP) - 03/28/18 0001      ADLs   ADL Comments  reviewed use of finger sleeves for decreased pressure on right hand and left hand digits when utilizing crutch.  Patient states she is thinking of ordering for herself on [place].       Exercises   Exercises  Hand      Hand Exercises   Sponges  able to use light resist yellow and then medium resist red clothespin to pick up max resist sponges with right hand X 25, able to pick up 5 with left hand utilizing the yellow clothespin.  Able to pick up 6 sponges with right hand with composite grip and 5 using in hand manipulation.              OT Education - 03/28/18 0950    Education provided  Yes    Education Details  reviewed goals, progress towards goals, and current HEP    Person(s) Educated  Patient    Methods  Explanation    Comprehension  Verbalized  understanding       OT Short Term Goals - 03/28/18 0952      OT SHORT TERM GOAL #1   Title  Patient will be educated on a HEP for improved left wrist extension needed to use left hand and arm as an active assist with daily activiites.     Time  4    Period  Weeks    Status  Achieved      OT SHORT TERM GOAL #2   Title  Patient will improve left wrist extension A/ROM to 25% for improved use of left hand as an active assist with ADLs.    Time  4    Period  Weeks    Status  Achieved      OT SHORT TERM GOAL #3   Title  Patient will improve left wrist strength to 3/5 for increased ability to tansfer with increased ease.    Time  4    Period  Weeks    Status  Not Met      OT SHORT TERM GOAL #4   Title  Patient will improve bilateral grip strength by 5 pounds for greater ability to maintain grasp on toothbrush and increased ease opening jars.     Time  4    Period  Weeks    Status  Not Met      OT SHORT TERM GOAL #5   Title  Patient will be educated on various pens/grips for improving comfort when writing.     Time  4    Period  Weeks    Status  Achieved        OT Long Term Goals - 03/28/18 0956      OT LONG TERM GOAL #1   Title  Patient will return to prior level of independence, using her left arm as an active assist with all daily activities.     Time  8    Period  Weeks    Status  Achieved      OT LONG TERM GOAL #2   Title  Patient will improve left wrist a/rom by 50% for improved ability to reach and grasp for household items with her left hand.    Time  8    Period  Weeks    Status  Partially Met      OT LONG TERM GOAL #3   Title  Patient will improve bilateral grip strength by 10 pounds for improved ability to maintain grasp on household items.     Time  8    Period  Weeks    Status  Not Met      OT LONG TERM GOAL #4   Title  Paitent will improve bilateral hand fine motor coordination by decreasing completion time on nine hole peg test by 4 seconds.       Time  8    Period  Weeks    Status  Not Met      OT LONG TERM GOAL #5   Title  Patient will be independent with use of compensatory handwriting utensils and improve comfort with writing.     Time  8    Period  Weeks    Status  Achieved      OT LONG TERM GOAL #6   Title  Patient will improve pain in her left wrist and hand to 3/10 or better when completing adls.    Time  8    Period  Weeks    Status  Achieved      OT LONG TERM GOAL #7   Title  Paitent will improve left hand sensation to 4.31 on dorsum of her hand for improved safety during adls.    Time  8    Period  Weeks    Status  Not Met      OT LONG TERM GOAL #8   Title  Patient will be independent with donning and doffing left hand splint.      Time  8    Period  Weeks    Status  Achieved            Plan - 03/28/18 0950    Clinical Impression Statement  A:  Patient has made minimal progress with grip strengthening and coordination, however pinch strength has improved, as has a/rom of left wrist extension and digit extension.  These improvements, along with splint use for left hand have greatly improved patients independence and satisfaction with ADL completion.  Patient is pleased with her current functional status and is ready to be dc from skilled OT intervention.     Plan  P:  DC from skilled OT intervention this date.  Patient will continue HEP for pinch strengthening and is pleased with her current functional status.        Patient will benefit from skilled therapeutic intervention in order to improve the following deficits and impairments:  Impaired sensation, Increased muscle spasms, Pain, Impaired tone, Decreased range of motion, Impaired flexibility, Impaired UE functional use, Decreased strength  Visit Diagnosis: Other symptoms and signs involving the nervous system  Pain in left wrist  Other lack of coordination    Problem List Patient Active Problem List   Diagnosis Date Noted  . Paraplegia  ([place]) 10/21/2017  . GAD (generalized anxiety disorder) 10/20/2017  . Major depressive disorder, recurrent, severe without psychotic features [facility])     Vangie Bicker, [place], OTR/L [phone]  03/28/2018, 9:57 AM  [facility]  [place], [place], [address] Phone: [phone]   Fax:  [phone]  Name: Tamsin Nader MRN: [id number] Date of Birth: 1971-05-01

## 2018-03-28 NOTE — Telephone Encounter (Signed)
03/28/18  [facility] is not running since its Good Friday... appt was rescheduled

## 2018-03-31 ENCOUNTER — Encounter (HOSPITAL_COMMUNITY): Payer: Self-pay

## 2018-04-05 ENCOUNTER — Encounter (HOSPITAL_COMMUNITY): Payer: Self-pay

## 2018-04-27 ENCOUNTER — Ambulatory Visit (HOSPITAL_COMMUNITY): Payer: Self-pay | Admitting: Licensed Clinical Social Worker

## 2018-05-17 IMAGING — DX DG WRIST COMPLETE 3+V*L*
4 series · 4 of 4 positions shown · non-contrast
Comparison: None.

CLINICAL DATA: Fell about [DATE] p.m. today atretic catch herself.
Bruising swelling on top of wrist.

EXAM:
LEFT WRIST - COMPLETE 3+ VIEW

[wrist pa]
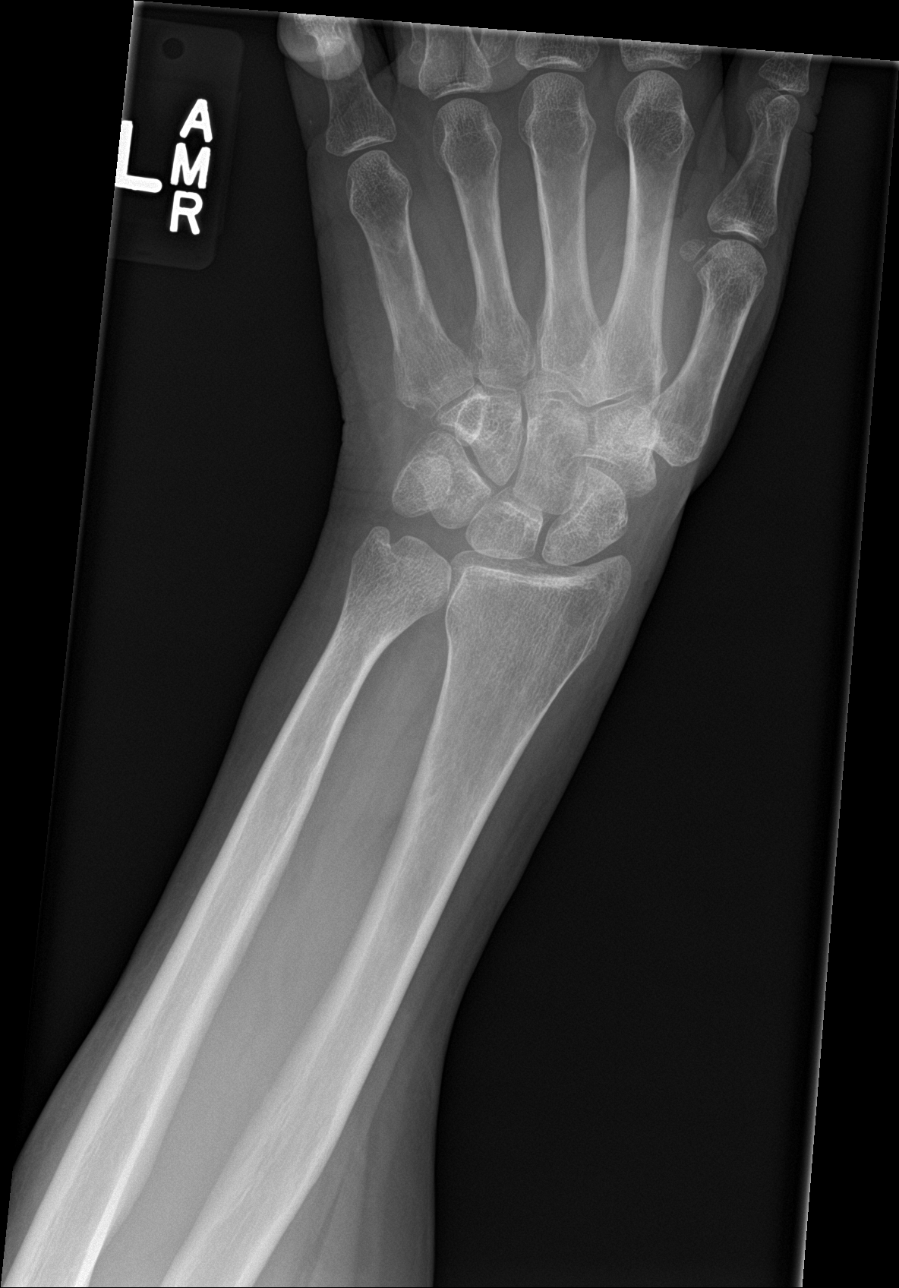

[wrist obl (1 of 2)]
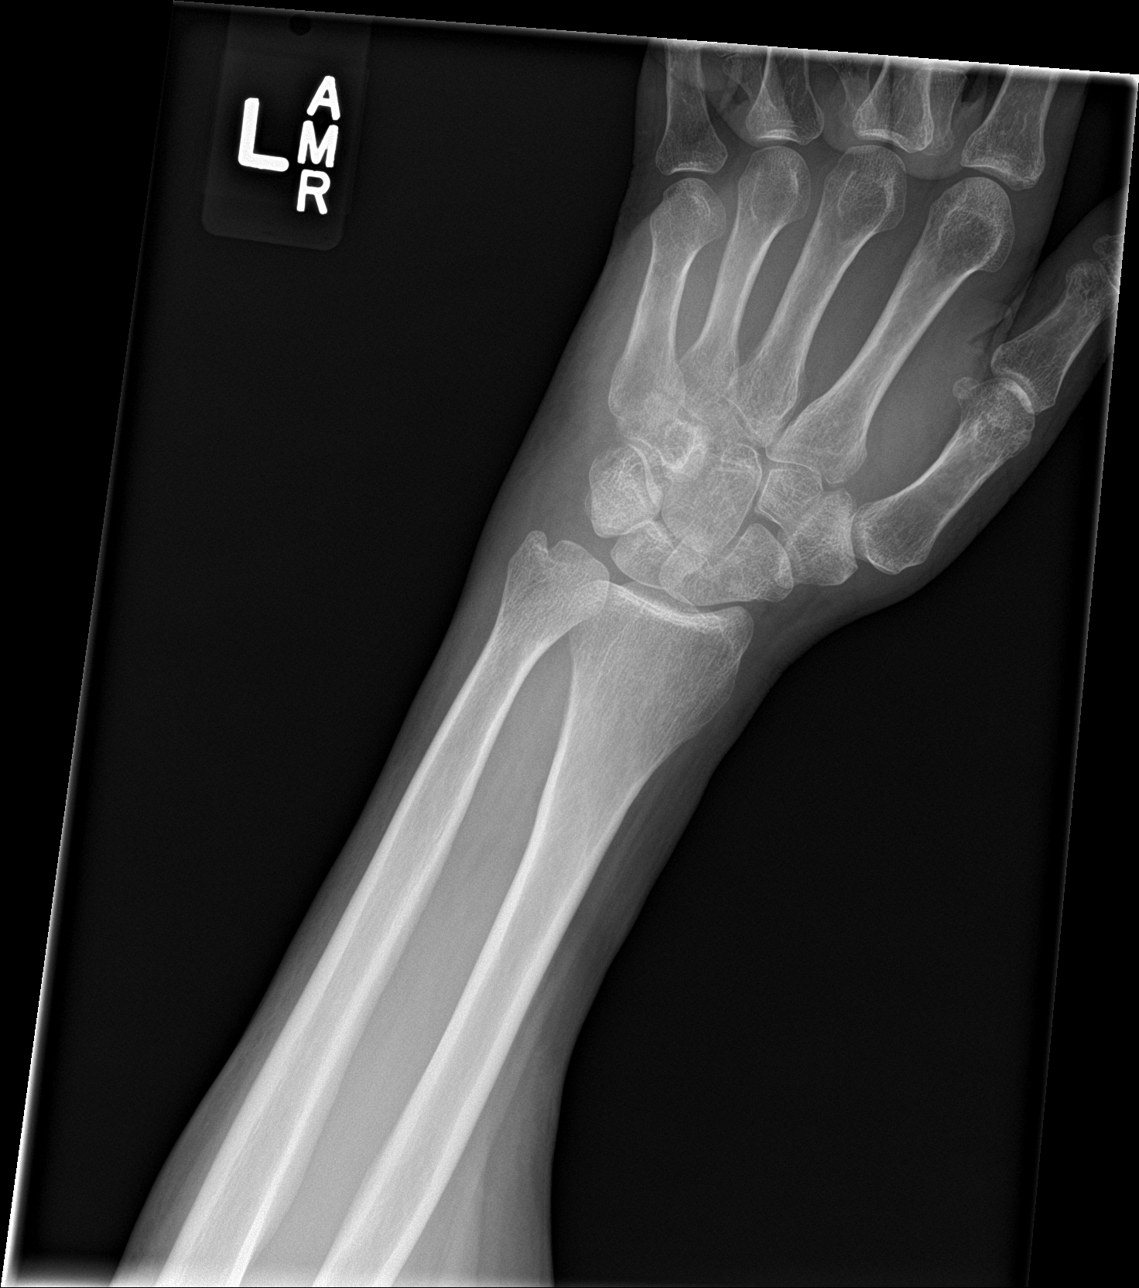

[wrist lat]
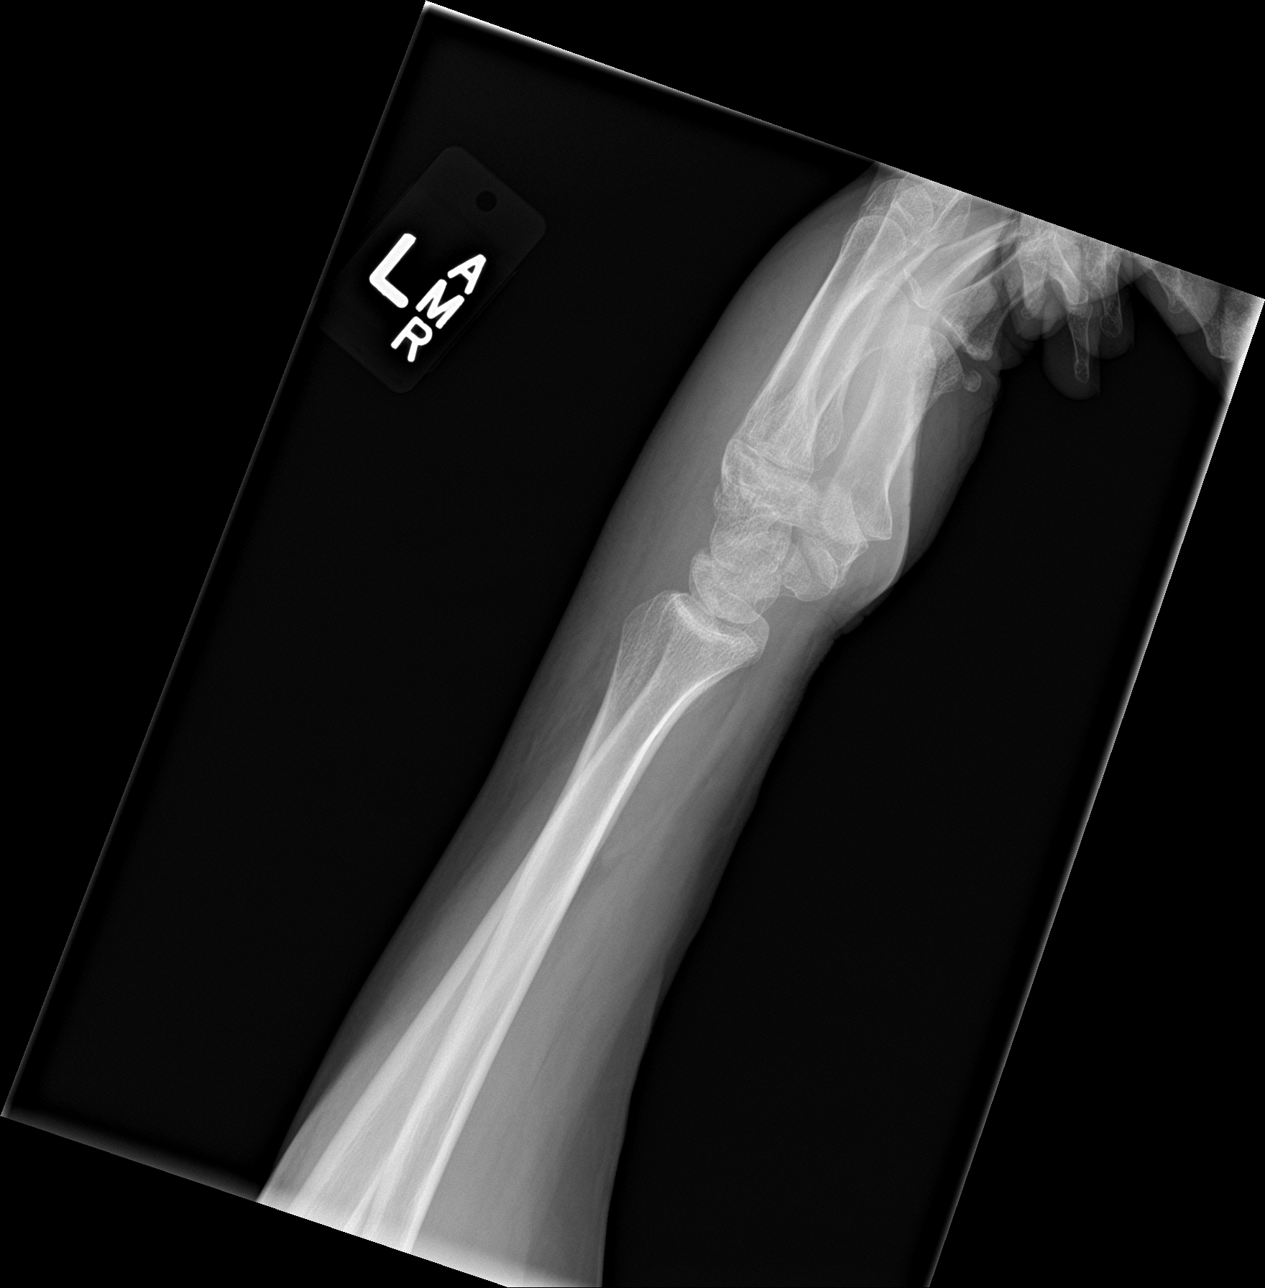

[wrist obl (2 of 2)]
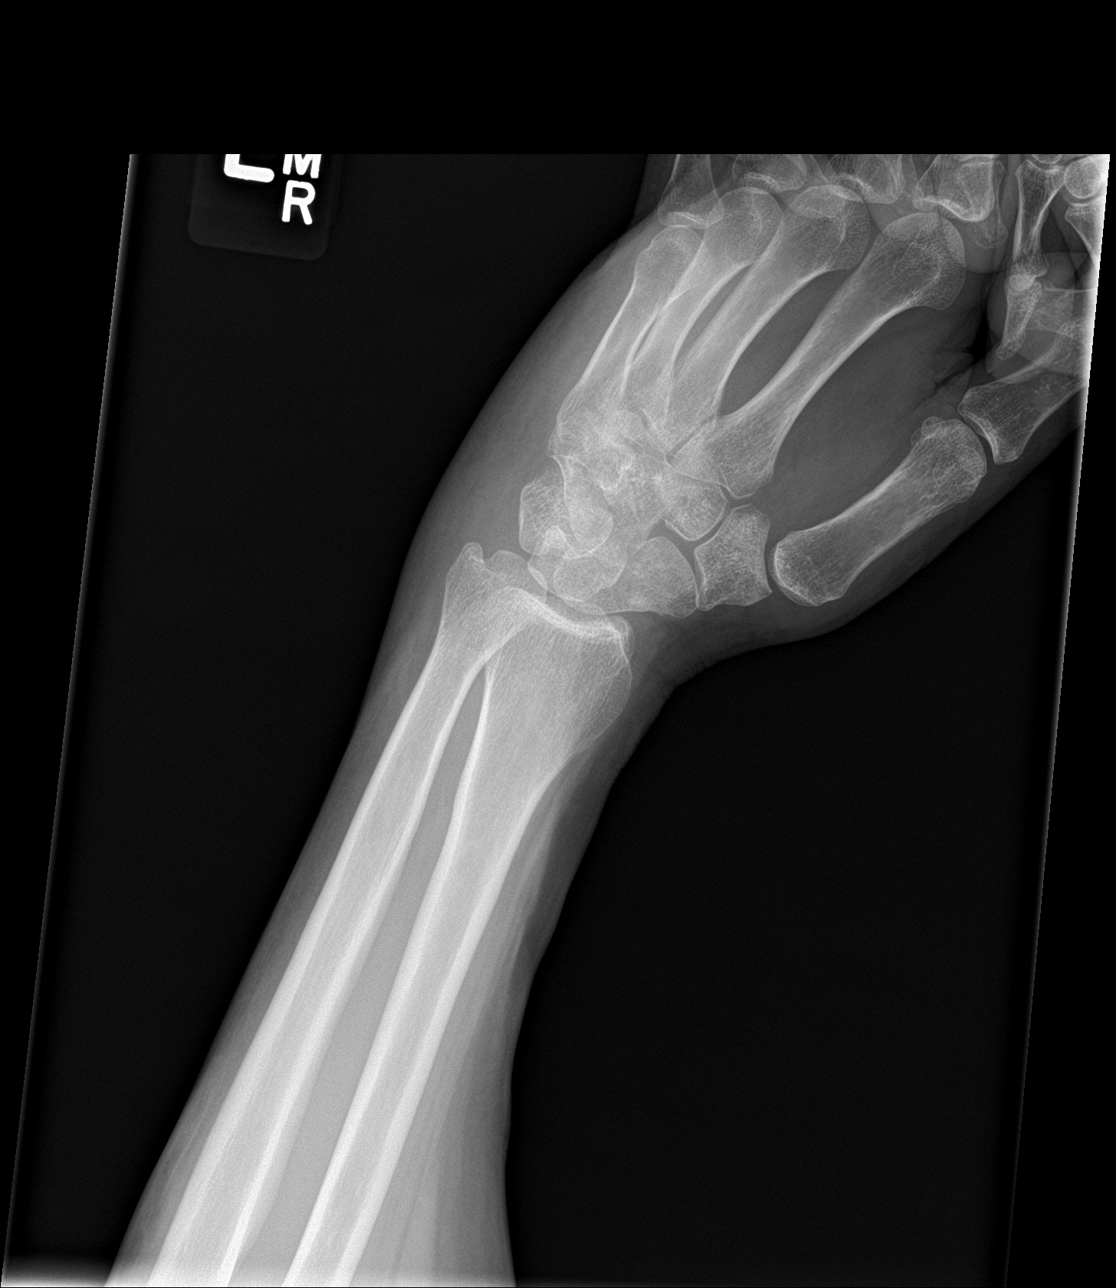

[4 of 4 positions shown; findings below may reference images not displayed]

FINDINGS: There is no evidence of fracture or dislocation. There is no
evidence of arthropathy or other focal bone abnormality. Soft
tissues are unremarkable.
IMPRESSION: Negative.

## 2018-05-18 ENCOUNTER — Ambulatory Visit (INDEPENDENT_AMBULATORY_CARE_PROVIDER_SITE_OTHER): Payer: Medicare Other | Admitting: Licensed Clinical Social Worker

## 2018-05-18 ENCOUNTER — Encounter (HOSPITAL_COMMUNITY): Payer: Self-pay | Admitting: Licensed Clinical Social Worker

## 2018-05-18 DIAGNOSIS — F332 Major depressive disorder, recurrent severe without psychotic features: Secondary | ICD-10-CM | POA: Diagnosis not present

## 2018-05-18 DIAGNOSIS — Z915 Personal history of self-harm: Secondary | ICD-10-CM | POA: Diagnosis not present

## 2018-05-18 DIAGNOSIS — F331 Major depressive disorder, recurrent, moderate: Secondary | ICD-10-CM

## 2018-05-18 NOTE — Progress Notes (Signed)
   THERAPIST PROGRESS NOTE  Session Time: 1:40 pm-2:20 pm  Participation Level: Active  Behavioral Response: CasualAlertDepressed  Type of Therapy: Individual Therapy  Treatment Goals addressed: Coping  Interventions: CBT and Solution Focused  Summary: Anaja Lubertha SouthKatherine Garay is a 47 y.o. female who presents  oriented x5 (person, place, situation, time and object), alert, casually dressed, appropriately groomed, walking with a cane and limp, depressed and cooperative  to address mood. Patient has a history of medical treatment due to attempted suicide and a history of mental health treatment including hospitalization, outpatient therapy and medication management. Patient denies symptoms of mania. Patient admitted passive thoughts of suicide and a history of suicide attempts. Patient denies homicidal ideations. Patient denies psychosis including auditory and visual hallucinations. Patient denies substance abuse. Patient is at moderate risk for lethality due to a history of suicidal ideations and attempts.  Physically: Patient is feeling good physically considering her health.   Spiritually/values: No issues related.  Relationships: Patient noted that she has a good relationship with her family but that her mother is a "downer." She makes patient feel bad for doing things that make her happy.   Emotional/Mental/Behavior: Patient iis feeling much better. She has changed the way she eats and is changing the way she thinks. She feels like she can use self talk to challenge some of her thoughts but feels guilty over her mother being in assisted living. She feels like her interactions with her mother bring on guilt or sad feelings at times.   Patient engaged in session. She responded well to interventions. Patient continues to meet criteria for Major depressive disorder, recurrent, severe without psychotic features. Patient will continue in outpatient therapy due to being the least restrictive service  to meet her needs at this time. Patient made moderate  progress on her goals.   Suicidal/Homicidal: Negativewithout intent/plan  Therapist Response: Therapist reviewed patient's recent thoughts and feelings. Therapist utilized CBT to address mood. Therapist reviewed patient goals. Therapist processed patient's feelings to identify triggers for depression. Therapist educated patient on irrational thought patterns and how it triggers feelings of anxiety, depression, and guilt.    Plan: Return again in 2 weeks.   Diagnosis: Axis I: Major depressive disorder, recurrent, severe without psychotic features    Axis II: No diagnosis    Bynum BellowsJoshua Acie Custis, LCSW 05/18/2018

## 2018-06-08 ENCOUNTER — Encounter (HOSPITAL_COMMUNITY): Payer: Self-pay | Admitting: Licensed Clinical Social Worker

## 2018-06-08 ENCOUNTER — Ambulatory Visit (INDEPENDENT_AMBULATORY_CARE_PROVIDER_SITE_OTHER): Payer: Medicare Other | Admitting: Licensed Clinical Social Worker

## 2018-06-08 DIAGNOSIS — F332 Major depressive disorder, recurrent severe without psychotic features: Secondary | ICD-10-CM

## 2018-06-08 NOTE — Progress Notes (Signed)
   THERAPIST PROGRESS NOTE  Session Time: 1:50 pm-2:30 pm  Participation Level: Active  Behavioral Response: CasualAlertDepressed  Type of Therapy: Individual Therapy  Treatment Goals addressed: Coping  Interventions: CBT and Solution Focused  Summary: Natasha [name] is a 47 y.o. female who presents  oriented x5 (person, place, situation, time and object), alert, casually dressed, appropriately groomed, walking with a cane and limp, depressed and cooperative  to address mood. Patient has a history of medical treatment due to attempted suicide and a history of mental health treatment including hospitalization, outpatient therapy and medication management. Patient denies symptoms of mania. Patient admitted passive thoughts of suicide and a history of suicide attempts. Patient denies homicidal ideations. Patient denies psychosis including auditory and visual hallucinations. Patient denies substance abuse. Patient is at moderate risk for lethality due to a history of suicidal ideations and attempts.  Physically: Patient is feeling well physically but also recognizes that she has physical limitations.  Spiritually/values: No issues related.  Relationships: Patient is getting along with her family. She does note that her conversations with her mother bring her down. Her mother complains to her constantly and see feels like she can't ask her mother to stop because she feels like no one else will listen.   Emotional/Mental/Behavior: Patient had a few days where she felt down. She has had difficulty with feeling happy due to guilt over her mother. She feels like she is not allowed to be happy.   Patient engaged in session. She responded well to interventions. Patient continues to meet criteria for Major depressive disorder, recurrent, severe without psychotic features. Patient will continue in outpatient therapy due to being the least restrictive service to meet her needs at this time. Patient  made moderate  progress on her goals.   Suicidal/Homicidal: Negativewithout intent/plan  Therapist Response: Therapist reviewed patient's recent thoughts and feelings. Therapist utilized CBT to address mood. Therapist reviewed patient goals. Therapist processed patient's feelings to identify triggers for depression. Therapist discussed with patient allowing herself to feel happiness.   Plan: Return again in 2 weeks.   Diagnosis: Axis I: Major depressive disorder, recurrent, severe without psychotic features    Axis II: No diagnosis    Bynum Natasha [name] Natasha Mcglory, LCSW 06/08/2018

## 2018-06-16 NOTE — Progress Notes (Deleted)
BH MD/PA/NP OP Progress Note  06/16/2018 10:23 AM Natasha [name]  MRN:  [id number]  Chief Complaint:  HPI: *** Visit Diagnosis: No diagnosis found.  Past Psychiatric History: Please see initial evaluation for full details. I have reviewed the history. No updates at this time.     Past Medical History:  Past Medical History:  Diagnosis Date  . C6 spinal cord injury ([facility])   . Depression     Past Surgical History:  Procedure Laterality Date  . BACK SURGERY    . NECK SURGERY      Family Psychiatric History: Please see initial evaluation for full details. I have reviewed the history. No updates at this time.     Family History:  Family History  Problem Relation Age of Onset  . Alcohol abuse Father   . Alcohol abuse Brother   . Depression Mother   . Alcohol abuse Mother     Social History:  Social History   Socioeconomic History  . Marital status: Single    Spouse name: Not on file  . Number of children: Not on file  . Years of education: Not on file  . Highest education level: Not on file  Occupational History  . Not on file  Social Needs  . Financial resource strain: Not on file  . Food insecurity:    Worry: Not on file    Inability: Not on file  . Transportation needs:    Medical: Not on file    Non-medical: Not on file  Tobacco Use  . Smoking status: Former Games developer  . Smokeless tobacco: Never Used  . Tobacco comment: No smoking hx; no need for cessation materials  Substance and Sexual Activity  . Alcohol use: No    Alcohol/week: 0.0 oz    Comment: Occasional use  . Drug use: No  . Sexual activity: Never  Lifestyle  . Physical activity:    Days per week: Not on file    Minutes per session: Not on file  . Stress: Not on file  Relationships  . Social connections:    Talks on phone: Not on file    Gets together: Not on file    Attends religious service: Not on file    Active member of club or organization: Not on file    Attends meetings  of clubs or organizations: Not on file    Relationship status: Not on file  Other Topics Concern  . Not on file  Social History Narrative  . Not on file    Allergies: No Known Allergies  Metabolic Disorder Labs: Lab Results  Component Value Date   HGBA1C 4.7 (L) 10/23/2017   MPG 88.19 10/23/2017   MPG 88 02/03/2017   Lab Results  Component Value Date   PROLACTIN 121.9 (H) 02/03/2017   Lab Results  Component Value Date   CHOL 181 10/23/2017   TRIG 56 10/23/2017   HDL 46 10/23/2017   CHOLHDL 3.9 10/23/2017   VLDL 11 10/23/2017   LDLCALC 124 (H) 10/23/2017   LDLCALC 149 (H) 02/03/2017   Lab Results  Component Value Date   TSH 5.474 (H) 02/03/2017   TSH 4.924 (H) 06/11/2015    Therapeutic Level Labs: Lab Results  Component Value Date   LITHIUM 0.88 02/13/2017   LITHIUM 0.57 (L) 02/03/2017   No results found for: VALPROATE No components found for:  CBMZ  Current Medications: Current Outpatient Medications  Medication Sig Dispense Refill  . busPIRone (BUSPAR) 10 MG tablet Take  1 tablet (10 mg total) by mouth 3 (three) times daily. 270 tablet 0  . clonazePAM (KLONOPIN) 0.5 MG tablet Take 1 tablet (0.5 mg total) by mouth 2 (two) times daily as needed (anxiety). 60 tablet 0  . DULoxetine (CYMBALTA) 60 MG capsule Take 1 capsule (60 mg total) by mouth daily. 90 capsule 0  . QUEtiapine (SEROQUEL) 25 MG tablet Take 1 tablet (25 mg total) by mouth 2 (two) times daily. 180 tablet 0  . traZODone (DESYREL) 100 MG tablet 100-200 mg at night as needed for sleep 180 tablet 0   No current facility-administered medications for this visit.      Musculoskeletal: Strength & Muscle Tone: within normal limits Gait & Station: normal Patient leans: N/A  Psychiatric Specialty Exam: ROS  There were no vitals taken for this visit.There is no height or weight on file to calculate BMI.  General Appearance: Fairly Groomed  Eye Contact:  Good  Speech:  Clear and Coherent  Volume:   Normal  Mood:  {BHH MOOD:22306}  Affect:  {Affect (PAA):22687}  Thought Process:  Coherent  Orientation:  Full (Time, Place, and Person)  Thought Content: Logical   Suicidal Thoughts:  {ST/HT (PAA):22692}  Homicidal Thoughts:  {ST/HT (PAA):22692}  Memory:  Immediate;   Good  Judgement:  {Judgement (PAA):22694}  Insight:  {Insight (PAA):22695}  Psychomotor Activity:  Normal  Concentration:  Concentration: Good and Attention Span: Good  Recall:  Good  Fund of Knowledge: Good  Language: Good  Akathisia:  No  Handed:  Right  AIMS (if indicated): not done  Assets:  Communication Skills Desire for Improvement  ADL's:  Intact  Cognition: WNL  Sleep:  {BHH GOOD/FAIR/POOR:22877}   Screenings: AIMS     [facility]) [facility] (Discharged[facility] from OP [facility] Ad[facility] (Discharged) from OP [facility] (Discharged) from [facility]  AIMS Total Score  0  0  0  0    AUDIT     [facility] (Discharged) from [facility] Ad[facility] (Discharged) from OP [facility] Ad[facility] (Discharged) from [facility]  Alcohol Use Disorder Identification Test Final Score (AUDIT)  0  0  1  1    ECT-MADRS     ECT [facility]  MADRS Total Score  4  43    GAD-7     Counselor from 06/24/2016 in [facility] [place]  Total GAD-7 Score  15     Mini-Mental     [facility]  Total Score (max 30 points )  29  30    PHQ2-9     Counselor from 06/24/2016 in [facility] OUTPATIENT THERAPY [place] Counselor from 11/04/2015 in [facility] OUTPATIENT THERAPY [place] Counselor from 06/27/2015 in BEHAVIORAL HEALTH INTENSIVE PSYCH  PHQ-2 Total Score  2  6  6   PHQ-9 Total Score  7  16  26        Assessment and Plan:  Natasha [name] is a 47 y.o. year old female with a history of depression,  significant history of past suicide attempts, s/p C6 spinal cord injury , who presents for follow up appointment for No diagnosis found.  # MDD, moderate, recurrent without psychotic features  Patient reports overall improvement in neurovegetative symptoms since the last visit. Will continue duloxetine to target depression.  Will continue quetiapine as adjunctive treatment for depression.  Will continue BuSpar for anxiety.  Will continue trazodone as needed for anxiety. will continue clonazepam as needed for anxiety. . Discussed risk of dependence and oversedation. Discussed self compassion. Discussed cognitive defusion. She is encouraged to continue to see Mr. Sheets for therapy  Plan  1.Continueduloxetine 60 mg daily 2. ContinueBuspar10 mg three times a day 3. Continue quetiapine 25 mg twice a day 4. Continue trazodone100- 200 mg at nightas needed for sleep  5.Continue clonazepam 0.5 mg twice a day for anxiety 6. Try melatonin 3 mg two hours before going to bed 7.Return to clinic inthree monthsfor 15 mins - She will continue to see Mr. Sheets for therapy  The patient demonstrates the following risk factors for suicide: Chronic risk factors for suicide include: psychiatric disorder of depression, previous suicide attempts of jumping from the balcony, overdosing medication,  previous self-harm cutting, medical illness of spinal cord injuryand completed suicide in a family member. Acute risk factorsfor suicide include: unemployment, Estate agent and loss (financial, interpersonal, professional). Protective factorsfor this patient include: coping skills and hope for the future. Considering these factors, the overall suicide risk at this point appears to be moderate, but not at    Neysa Hotter, MD 06/16/2018, 10:23 AM

## 2018-06-22 ENCOUNTER — Ambulatory Visit (HOSPITAL_COMMUNITY): Payer: Medicare Other | Admitting: Psychiatry

## 2018-06-30 ENCOUNTER — Telehealth (HOSPITAL_COMMUNITY): Payer: Self-pay | Admitting: Psychiatry

## 2018-07-03 ENCOUNTER — Telehealth (HOSPITAL_COMMUNITY): Payer: Self-pay | Admitting: Psychiatry

## 2018-07-03 ENCOUNTER — Other Ambulatory Visit (HOSPITAL_COMMUNITY): Payer: Self-pay | Admitting: Psychiatry

## 2018-07-03 MED ORDER — TRAZODONE HCL 100 MG PO TABS: ORAL_TABLET | ORAL | 0 refills | Status: DC

## 2018-07-03 MED ORDER — QUETIAPINE FUMARATE 25 MG PO TABS: 25.0000 mg | ORAL_TABLET | Freq: Two times a day (BID) | ORAL | 0 refills | Status: DC

## 2018-07-03 MED ORDER — DULOXETINE HCL 60 MG PO CPEP: 60.0000 mg | ORAL_CAPSULE | Freq: Every day | ORAL | 0 refills | Status: DC

## 2018-07-03 MED ORDER — BUSPIRONE HCL 10 MG PO TABS: 10.0000 mg | ORAL_TABLET | Freq: Three times a day (TID) | ORAL | 0 refills | Status: DC

## 2018-07-03 NOTE — Telephone Encounter (Signed)
LVM stating that refill request received from the pharmacy. Asked if she needs refill of quetiapine, buspirone, duloxetine, trazodone? Ask to call back for refill request & to make a f/u appointment

## 2018-07-03 NOTE — Telephone Encounter (Signed)
Dr Vanetta ShawlHisada Patient called to confirm refills on quetiapine, buspirone, duloxetine, trazodone

## 2018-07-03 NOTE — Telephone Encounter (Signed)
Received refill request from the pharmacy. Could you ask if the patient needs refill of quetiapine, buspirone, duloxetine, trazodone? Also advise her to make follow up appointment.

## 2018-07-10 NOTE — Progress Notes (Deleted)
BH MD/PA/NP OP Progress Note  07/10/2018 10:22 AM Natasha [name]  MRN:  [id number]  Chief Complaint:  HPI: *** Visit Diagnosis: No diagnosis found.  Past Psychiatric History: Please see initial evaluation for full details. I have reviewed the history. No updates at this time.     Past Medical History:  Past Medical History:  Diagnosis Date  . C6 spinal cord injury (HCC)   . Depression     Past Surgical History:  Procedure Laterality Date  . BACK SURGERY    . NECK SURGERY      Family Psychiatric History: Please see initial evaluation for full details. I have reviewed the history. No updates at this time.     Family History:  Family History  Problem Relation Age of Onset  . Alcohol abuse Father   . Alcohol abuse Brother   . Depression Mother   . Alcohol abuse Mother     Social History:  Social History   Socioeconomic History  . Marital status: Single    Spouse name: Not on file  . Number of children: Not on file  . Years of education: Not on file  . Highest education level: Not on file  Occupational History  . Not on file  Social Needs  . Financial resource strain: Not on file  . Food insecurity:    Worry: Not on file    Inability: Not on file  . Transportation needs:    Medical: Not on file    Non-medical: Not on file  Tobacco Use  . Smoking status: Former Games developer  . Smokeless tobacco: Never Used  . Tobacco comment: No smoking hx; no need for cessation materials  Substance and Sexual Activity  . Alcohol use: No    Alcohol/week: 0.0 oz    Comment: Occasional use  . Drug use: No  . Sexual activity: Never  Lifestyle  . Physical activity:    Days per week: Not on file    Minutes per session: Not on file  . Stress: Not on file  Relationships  . Social connections:    Talks on phone: Not on file    Gets together: Not on file    Attends religious service: Not on file    Active member of club or organization: Not on file    Attends meetings  of clubs or organizations: Not on file    Relationship status: Not on file  Other Topics Concern  . Not on file  Social History Narrative  . Not on file    Allergies: No Known Allergies  Metabolic Disorder Labs: Lab Results  Component Value Date   HGBA1C 4.7 (L) 10/23/2017   MPG 88.19 10/23/2017   MPG 88 02/03/2017   Lab Results  Component Value Date   PROLACTIN 121.9 (H) 02/03/2017   Lab Results  Component Value Date   CHOL 181 10/23/2017   TRIG 56 10/23/2017   HDL 46 10/23/2017   CHOLHDL 3.9 10/23/2017   VLDL 11 10/23/2017   LDLCALC 124 (H) 10/23/2017   LDLCALC 149 (H) 02/03/2017   Lab Results  Component Value Date   TSH 5.474 (H) 02/03/2017   TSH 4.924 (H) 06/11/2015    Therapeutic Level Labs: Lab Results  Component Value Date   LITHIUM 0.88 02/13/2017   LITHIUM 0.57 (L) 02/03/2017   No results found for: VALPROATE No components found for:  CBMZ  Current Medications: Current Outpatient Medications  Medication Sig Dispense Refill  . busPIRone (BUSPAR) 10 MG tablet Take  1 tablet (10 mg total) by mouth 3 (three) times daily. 270 tablet 0  . clonazePAM (KLONOPIN) 0.5 MG tablet Take 1 tablet (0.5 mg total) by mouth 2 (two) times daily as needed (anxiety). 60 tablet 0  . DULoxetine (CYMBALTA) 60 MG capsule Take 1 capsule (60 mg total) by mouth daily. 90 capsule 0  . QUEtiapine (SEROQUEL) 25 MG tablet Take 1 tablet (25 mg total) by mouth 2 (two) times daily. 180 tablet 0  . traZODone (DESYREL) 100 MG tablet 100-200 mg at night as needed for sleep 180 tablet 0   No current facility-administered medications for this visit.      Musculoskeletal: Strength & Muscle Tone: within normal limits Gait & Station: normal Patient leans: N/A  Psychiatric Specialty Exam: ROS  There were no vitals taken for this visit.There is no height or weight on file to calculate BMI.  General Appearance: Fairly Groomed  Eye Contact:  Good  Speech:  Clear and Coherent  Volume:   Normal  Mood:  {BHH MOOD:22306}  Affect:  {Affect (PAA):22687}  Thought Process:  Coherent  Orientation:  Full (Time, Place, and Person)  Thought Content: Logical   Suicidal Thoughts:  {ST/HT (PAA):22692}  Homicidal Thoughts:  {ST/HT (PAA):22692}  Memory:  Immediate;   Good  Judgement:  {Judgement (PAA):22694}  Insight:  {Insight (PAA):22695}  Psychomotor Activity:  Normal  Concentration:  Concentration: Good and Attention Span: Good  Recall:  Good  Fund of Knowledge: Good  Language: Good  Akathisia:  No  Handed:  Right  AIMS (if indicated): not done  Assets:  Communication Skills Desire for Improvement  ADL's:  Intact  Cognition: WNL  Sleep:  {BHH GOOD/FAIR/POOR:22877}   Screenings: AIMS     [facility] (Discharged) from OP [facility] Ad[facility] (Discharged) from [facility] Ad[facility] (Discharged) from [facility]  AIMS Total Score  0  0  0  0    AUDIT     [facility] Admission (Discharged) from OP [facility] Admission (Discharged) from OP Visit [facility] Admission (Discharged) from [facility]  Alcohol Use Disorder Identification Test Final Score (AUDIT)  0  0  1  1    ECT-MADRS     [facility] (Discharged) [facility]  MADRS Total Score  4  43    GAD-7     Counselor from 06/24/2016 in [facility] [place]  Total GAD-7 Score  15     Mini-Mental     [facility] Ad[facility] (Discharged) [facility]  Total Score (max 30 points )  29  30    PHQ2-9     Counselor from 06/24/2016 in [facility] OUTPATIENT [facility]ERAPY [place] Counselor from 11/04/2015 in [facility] OUTPATIENT [facility]AP[facility] [place] Counselor from 06/27/2015 in [facility] INTENSIVE [facility]  PHQ-2 Total Score  2  6  6   PHQ-9 Total Score  7  16  26        Assessment and Plan:  Natasha [name] is a 47 y.o. year old female with a history of depression, significant history of past suicide attempts, s/p C6 spinal cord injury , who presents for follow up appointment for No diagnosis found.  # MDD, moderate, recurrent without psychotic features  Patient reports overall improvement in neurovegetative symptoms since the last visit. Will continue duloxetine to target depression.  Will continue quetiapine as adjunctive treatment for depression.  Will continue BuSpar for anxiety.  Will continue trazodone as needed for anxiety. will continue clonazepam as needed for anxiety. . Discussed risk of dependence and oversedation. Discussed self compassion. Discussed cognitive defusion. She is encouraged to continue to see Mr. Sheets for therapy  Plan  1.Continueduloxetine 60 mg daily 2. ContinueBuspar10 mg three times a day 3. Continue quetiapine 25 mg twice a day 4. Continue trazodone100- 200 mg at nightas needed for sleep  5.Continue clonazepam 0.5 mg twice a day for anxiety 6. Try melatonin 3 mg two hours before going to bed 7.Return to clinic inthree monthsfor 15 mins - She will continue to see Mr. Sheets for therapy  The patient demonstrates the following risk factors for suicide: Chronic risk factors for suicide include: psychiatric disorder of depression, previous suicide attempts of jumping from the balcony, overdosing medication,  previous self-harm cutting, medical illness of spinal cord injuryand completed suicide in a family member. Acute risk factorsfor suicide include: unemployment, Estate agent and loss (financial, interpersonal, professional). Protective factorsfor this patient include: coping skills and hope for the future. Considering these factors, the overall suicide risk at this point appears to be moderate, but not at imminent danger to self. Patient isappropriate for outpatient follow up.    Neysa Hotter, MD 07/10/2018, 10:22 AM

## 2018-07-17 ENCOUNTER — Ambulatory Visit (HOSPITAL_COMMUNITY): Payer: Medicare Other | Admitting: Psychiatry

## 2018-07-18 ENCOUNTER — Ambulatory Visit (HOSPITAL_COMMUNITY): Payer: Medicare Other | Admitting: Psychiatry

## 2018-08-03 ENCOUNTER — Telehealth (HOSPITAL_COMMUNITY): Payer: Self-pay | Admitting: Psychiatry

## 2018-08-03 NOTE — Telephone Encounter (Signed)
Will do!

## 2018-08-07 ENCOUNTER — Ambulatory Visit (HOSPITAL_COMMUNITY): Payer: Self-pay | Admitting: Licensed Clinical Social Worker

## 2018-08-10 ENCOUNTER — Ambulatory Visit (HOSPITAL_COMMUNITY): Payer: Medicare Other | Admitting: Psychiatry

## 2018-08-15 ENCOUNTER — Telehealth (HOSPITAL_COMMUNITY): Payer: Self-pay | Admitting: Psychiatry

## 2018-08-15 NOTE — Telephone Encounter (Signed)
Received refill request of clonazepam from pharmacy. Could you contact the patient if she needs this medication? Also, please advice the patient to make follow up appointment. Although I believe she was already notified, she may need to wait for a while to be transferred to Dr. Lolly Mustache as he is currently on leave (she requested to be transferred as she relocated to [facility]). If she has challenges in transportation to come to [place] office, advice her to consider  follow up at Saturday clinic in [place].

## 2018-08-16 ENCOUNTER — Ambulatory Visit (HOSPITAL_COMMUNITY): Payer: Self-pay | Admitting: Licensed Clinical Social Worker

## 2018-08-17 NOTE — Telephone Encounter (Signed)
Called and left voicemail message to give office a phone call

## 2018-09-28 ENCOUNTER — Ambulatory Visit (HOSPITAL_COMMUNITY): Payer: Medicare Other | Admitting: Psychiatry

## 2018-10-02 NOTE — Progress Notes (Deleted)
BH MD/PA/NP OP Progress Note  10/02/2018 12:38 PM Natasha [name]  MRN:  [id number]  Chief Complaint:  HPI: *** Visit Diagnosis: No diagnosis found.  Past Psychiatric History: Please see initial evaluation for full details. I have reviewed the history. No updates at this time.     Past Medical History:  Past Medical History:  Diagnosis Date  . C6 spinal cord injury (HCC)   . Depression     Past Surgical History:  Procedure Laterality Date  . BACK SURGERY    . NECK SURGERY      Family Psychiatric History: Please see initial evaluation for full details. I have reviewed the history. No updates at this time.     Family History:  Family History  Problem Relation Age of Onset  . Alcohol abuse Father   . Alcohol abuse Brother   . Depression Mother   . Alcohol abuse Mother     Social History:  Social History   Socioeconomic History  . Marital status: Single    Spouse name: Not on file  . Number of children: Not on file  . Years of education: Not on file  . Highest education level: Not on file  Occupational History  . Not on file  Social Needs  . Financial resource strain: Not on file  . Food insecurity:    Worry: Not on file    Inability: Not on file  . Transportation needs:    Medical: Not on file    Non-medical: Not on file  Tobacco Use  . Smoking status: Former Games developer  . Smokeless tobacco: Never Used  . Tobacco comment: No smoking hx; no need for cessation materials  Substance and Sexual Activity  . Alcohol use: No    Alcohol/week: 0.0 standard drinks    Comment: Occasional use  . Drug use: No  . Sexual activity: Never  Lifestyle  . Physical activity:    Days per week: Not on file    Minutes per session: Not on file  . Stress: Not on file  Relationships  . Social connections:    Talks on phone: Not on file    Gets together: Not on file    Attends religious service: Not on file    Active member of club or organization: Not on file   Attends meetings of clubs or organizations: Not on file    Relationship status: Not on file  Other Topics Concern  . Not on file  Social History Narrative  . Not on file    Allergies: No Known Allergies  Metabolic Disorder Labs: Lab Results  Component Value Date   HGBA1C 4.7 (L) 10/23/2017   MPG 88.19 10/23/2017   MPG 88 02/03/2017   Lab Results  Component Value Date   PROLACTIN 121.9 (H) 02/03/2017   Lab Results  Component Value Date   CHOL 181 10/23/2017   TRIG 56 10/23/2017   HDL 46 10/23/2017   CHOLHDL 3.9 10/23/2017   VLDL 11 10/23/2017   LDLCALC 124 (H) 10/23/2017   LDLCALC 149 (H) 02/03/2017   Lab Results  Component Value Date   TSH 5.474 (H) 02/03/2017   TSH 4.924 (H) 06/11/2015    Therapeutic Level Labs: Lab Results  Component Value Date   LITHIUM 0.88 02/13/2017   LITHIUM 0.57 (L) 02/03/2017   No results found for: VALPROATE No components found for:  CBMZ  Current Medications: Current Outpatient Medications  Medication Sig Dispense Refill  . busPIRone (BUSPAR) 10 MG tablet Take 1  tablet (10 mg total) by mouth 3 (three) times daily. 270 tablet 0  . clonazePAM (KLONOPIN) 0.5 MG tablet Take 1 tablet (0.5 mg total) by mouth 2 (two) times daily as needed (anxiety). 60 tablet 0  . DULoxetine (CYMBALTA) 60 MG capsule Take 1 capsule (60 mg total) by mouth daily. 90 capsule 0  . QUEtiapine (SEROQUEL) 25 MG tablet Take 1 tablet (25 mg total) by mouth 2 (two) times daily. 180 tablet 0  . traZODone (DESYREL) 100 MG tablet 100-200 mg at night as needed for sleep 180 tablet 0   No current facility-administered medications for this visit.      Musculoskeletal: Strength & Muscle Tone: within normal limits Gait & Station: normal Patient leans: N/A  Psychiatric Specialty Exam: ROS  There were no vitals taken for this visit.There is no height or weight on file to calculate BMI.  General Appearance: Fairly Groomed  Eye Contact:  Good  Speech:  Clear and  Coherent  Volume:  Normal  Mood:  {BHH MOOD:22306}  Affect:  {Affect (PAA):22687}  Thought Process:  Coherent  Orientation:  Full (Time, Place, and Person)  Thought Content: Logical   Suicidal Thoughts:  {ST/HT (PAA):22692}  Homicidal Thoughts:  {ST/HT (PAA):22692}  Memory:  Immediate;   Good  Judgement:  {Judgement (PAA):22694}  Insight:  {Insight (PAA):22695}  Psychomotor Activity:  Normal  Concentration:  Concentration: Good and Attention Span: Good  Recall:  Good  Fund of Knowledge: Good  Language: Good  Akathisia:  No  Handed:  Right  AIMS (if indicated): not done  Assets:  Communication Skills Desire for Improvement  ADL's:  Intact  Cognition: WNL  Sleep:  {BHH GOOD/FAIR/POOR:22877}   Screenings: AIMS     [facility] Ad[facility] (Discharged) from OP Visit [facility] Admission (Discharged) from OP Visit [facility] Ad[facility] (Discharged) from [facility]  AIMS Total Score  0  0  0  0    AUDIT     Admission (Discharged) from 10/21/2017 in [facility] Admission (Discharged) from OP [facility] Admission (Discharged) from OP Visit [facility] Admission (Discharged) from [facility]  Alcohol Use Disorder Identification Test Final Score (AUDIT)  0  0  1  1    ECT-MADRS     ECT [facility]16[facility]2017 [facility] Admission (Discharged) from 10/21/2017 in [facility]  MADRS Total Score  4  43    GAD-7     Counselor from 06/24/2016 in BE[facility] OUTPATIENT THERAPY [place]  Total GAD-7  Score  15    Mini-Mental     ECT Treatment from 11/16/2017 [facility] Admission (Discharged) from 10/21/2017 in [facility] MEDIC[facility]  Total Score (max 30 points )  29  30    PHQ2-9     Counselor from 06/24/2016 in BEHAVIORAL HEALTH OUTPATIENT THERAPY [place] Counselor from 11/04/2015 in [facility] OUTPATIENT THERAPY [place] Counselor from 06/27/2015 in BEHAVIORAL HEALTH INTENSIVE PSYCH  PHQ-2 Total Score  2  6  6   PHQ-9 Total Score  7  16  26        Assessment and Plan:  Natasha [name] is a 46 y.o. year old female with a history of depression, significant history of past suicide attempts, s/p C6 spinal cord injury , who presents for follow up appointment for No diagnosis found.   # MDD, moderate, recurrent without psychotic features  Patient reports overall improvement in neurovegetative symptoms since the last visit. Will continue duloxetine to target depression.  Will continue quetiapine as adjunctive treatment for depression.  Will continue BuSpar for anxiety.  Will continue trazodone as needed for anxiety. will continue clonazepam as needed for anxiety. . Discussed risk of dependence and oversedation. Discussed self compassion. Discussed cognitive defusion. She is encouraged to continue to see Mr. Sheets for therapy  Plan  1.Continueduloxetine 60 mg daily 2. ContinueBuspar10 mg three times a day 3. Continue quetiapine 25 mg twice a day 4. Continue trazodone100- 200 mg at nightas needed for sleep  5.Continue clonazepam 0.5 mg twice a day for anxiety 6. Try melatonin 3 mg two hours before going to bed 7.Return to clinic inthree monthsfor 15 mins - She will continue to see Mr. Sheets for therapy  The patient demonstrates the following risk factors for suicide: Chronic risk factors for suicide include: psychiatric disorder of depression, previous suicide attempts of jumping from the balcony,  overdosing medication, previous self-harm cutting, medical illness of spinal cord injuryand completed suicide in a family member. Acute risk factorsfor suicide include: unemployment, Estate agent and loss (financial, interpersonal, professional). Protective factorsfor this patient include: coping skills and hope for the future. Considering these factors, the overall suicide risk at this point appears to be moderate, but not at imminent danger to self. Patient isappropriate for outpatient follow up.  Neysa Hotter, MD 10/02/2018, 12:38 PM

## 2018-10-03 ENCOUNTER — Other Ambulatory Visit (HOSPITAL_COMMUNITY): Payer: Self-pay | Admitting: Psychiatry

## 2018-10-03 ENCOUNTER — Telehealth (HOSPITAL_COMMUNITY): Payer: Self-pay | Admitting: *Deleted

## 2018-10-03 ENCOUNTER — Ambulatory Visit (HOSPITAL_COMMUNITY): Payer: Self-pay | Admitting: Psychiatry

## 2018-10-03 MED ORDER — QUETIAPINE FUMARATE 25 MG PO TABS: 25.0000 mg | ORAL_TABLET | Freq: Two times a day (BID) | ORAL | 0 refills | Status: DC

## 2018-10-03 MED ORDER — DULOXETINE HCL 60 MG PO CPEP: 60.0000 mg | ORAL_CAPSULE | Freq: Every day | ORAL | 0 refills | Status: DC

## 2018-10-03 MED ORDER — BUSPIRONE HCL 10 MG PO TABS: 10.0000 mg | ORAL_TABLET | Freq: Three times a day (TID) | ORAL | 0 refills | Status: DC

## 2018-10-03 NOTE — Telephone Encounter (Signed)
Dr Vanetta Shawl Patient called in requesting refills on her med's except the Trazodone. She cancel appt for today due to family issues  &  closing on house. Stated she may need to transfer to the [place] office but will call back to set up

## 2018-10-03 NOTE — Telephone Encounter (Signed)
Spoke with patient  & informed per provider : Refills Ordered. Please advise the patient to make appointment at Saturday clinic if she is difficult to get in to [place] office.  I have not seen her over six months, and I will not be able to do any more refill without evaluation

## 2018-10-03 NOTE — Telephone Encounter (Signed)
Ordered. Please advise the patient to make appointment at Saturday clinic if she is difficult to get in to [place] office.  I have not seen her over six months, and I will not be able to do any more refill without evaluation.

## 2019-01-08 DIAGNOSIS — F331 Major depressive disorder, recurrent, moderate: Secondary | ICD-10-CM | POA: Diagnosis not present

## 2019-01-08 DIAGNOSIS — F411 Generalized anxiety disorder: Secondary | ICD-10-CM | POA: Diagnosis not present

## 2019-01-11 ENCOUNTER — Other Ambulatory Visit (HOSPITAL_COMMUNITY): Payer: Self-pay | Admitting: Psychiatry

## 2019-01-11 ENCOUNTER — Telehealth (HOSPITAL_COMMUNITY): Payer: Self-pay | Admitting: Psychiatry

## 2019-01-11 ENCOUNTER — Encounter (HOSPITAL_COMMUNITY): Payer: Self-pay | Admitting: Psychiatry

## 2019-01-11 MED ORDER — DULOXETINE HCL 60 MG PO CPEP: 60.0000 mg | ORAL_CAPSULE | Freq: Every day | ORAL | 0 refills | Status: DC

## 2019-01-11 MED ORDER — QUETIAPINE FUMARATE 25 MG PO TABS: 25.0000 mg | ORAL_TABLET | Freq: Two times a day (BID) | ORAL | 0 refills | Status: DC

## 2019-01-11 NOTE — Telephone Encounter (Signed)
Dr Vanetta Shawl Attempted x 3 calls to the #  Listed . No Contact made. Patient may need to have a letter sent to her.

## 2019-01-11 NOTE — Telephone Encounter (Signed)
Received refill request of quetiapine. I will order only this time. As we have discussed before, I would not be able to do any more refills as I have not seen the patient since last April. I remember she was thinking of transferring to [place], but I am not sure if has decided that way. Please contact the patient to make follow up, at least at Saturday clinic in [place].  If that is not feasible, advise the patient to be followed by primary care doctor. (Please discuss this with the patient, not on voice mail. If she is not available, will send her a letter.)

## 2019-01-11 NOTE — Telephone Encounter (Signed)
Will send a letter

## 2019-01-24 ENCOUNTER — Telehealth (HOSPITAL_COMMUNITY): Payer: Self-pay | Admitting: *Deleted

## 2019-01-24 NOTE — Telephone Encounter (Signed)
Dr Vanetta Shawl Patient  Called stating she received your letter in the mail. And that she has moved to [facility] &   re-established  new Psychiatrist care in [place].

## 2019-02-14 DIAGNOSIS — F3340 Major depressive disorder, recurrent, in remission, unspecified: Secondary | ICD-10-CM | POA: Diagnosis not present

## 2019-02-14 DIAGNOSIS — F411 Generalized anxiety disorder: Secondary | ICD-10-CM | POA: Diagnosis not present

## 2019-05-11 DIAGNOSIS — F419 Anxiety disorder, unspecified: Secondary | ICD-10-CM | POA: Diagnosis not present

## 2019-05-11 DIAGNOSIS — Z86718 Personal history of other venous thrombosis and embolism: Secondary | ICD-10-CM

## 2019-05-11 DIAGNOSIS — F3342 Major depressive disorder, recurrent, in full remission: Secondary | ICD-10-CM | POA: Diagnosis not present

## 2019-05-11 DIAGNOSIS — H9312 Tinnitus, left ear: Secondary | ICD-10-CM | POA: Diagnosis not present

## 2019-05-11 DIAGNOSIS — S14109A Unspecified injury at unspecified level of cervical spinal cord, initial encounter: Secondary | ICD-10-CM | POA: Diagnosis not present

## 2019-05-11 DIAGNOSIS — Z1231 Encounter for screening mammogram for malignant neoplasm of breast: Secondary | ICD-10-CM | POA: Diagnosis not present

## 2019-05-11 DIAGNOSIS — Z1211 Encounter for screening for malignant neoplasm of colon: Secondary | ICD-10-CM | POA: Diagnosis not present

## 2019-05-11 DIAGNOSIS — H6123 Impacted cerumen, bilateral: Secondary | ICD-10-CM | POA: Diagnosis not present

## 2019-05-16 DIAGNOSIS — F411 Generalized anxiety disorder: Secondary | ICD-10-CM | POA: Diagnosis not present

## 2019-05-16 DIAGNOSIS — F3340 Major depressive disorder, recurrent, in remission, unspecified: Secondary | ICD-10-CM | POA: Diagnosis not present

## 2019-05-22 DIAGNOSIS — H6123 Impacted cerumen, bilateral: Secondary | ICD-10-CM | POA: Diagnosis not present

## 2019-05-22 DIAGNOSIS — H9313 Tinnitus, bilateral: Secondary | ICD-10-CM | POA: Diagnosis not present

## 2019-05-22 DIAGNOSIS — Z822 Family history of deafness and hearing loss: Secondary | ICD-10-CM | POA: Diagnosis not present

## 2019-06-01 DIAGNOSIS — H903 Sensorineural hearing loss, bilateral: Secondary | ICD-10-CM | POA: Diagnosis not present

## 2019-06-01 DIAGNOSIS — H9312 Tinnitus, left ear: Secondary | ICD-10-CM | POA: Diagnosis not present

## 2019-06-12 ENCOUNTER — Other Ambulatory Visit: Payer: Self-pay | Admitting: Physician Assistant

## 2019-06-12 DIAGNOSIS — Z1231 Encounter for screening mammogram for malignant neoplasm of breast: Secondary | ICD-10-CM

## 2019-07-10 ENCOUNTER — Other Ambulatory Visit: Payer: Self-pay | Admitting: Physician Assistant

## 2019-07-10 DIAGNOSIS — N6310 Unspecified lump in the right breast, unspecified quadrant: Secondary | ICD-10-CM

## 2019-07-10 DIAGNOSIS — N63 Unspecified lump in unspecified breast: Secondary | ICD-10-CM

## 2019-07-13 ENCOUNTER — Ambulatory Visit
Admission: RE | Admit: 2019-07-13 | Discharge: 2019-07-13 | Disposition: A | Discharge status: Home / Self Care | Payer: Medicare Other | Source: Ambulatory Visit | Attending: Physician Assistant | Admitting: Physician Assistant

## 2019-07-13 ENCOUNTER — Other Ambulatory Visit: Payer: Self-pay

## 2019-07-13 DIAGNOSIS — R922 Inconclusive mammogram: Secondary | ICD-10-CM | POA: Diagnosis not present

## 2019-07-13 DIAGNOSIS — N63 Unspecified lump in unspecified breast: Secondary | ICD-10-CM

## 2019-07-13 DIAGNOSIS — N6001 Solitary cyst of right breast: Secondary | ICD-10-CM | POA: Diagnosis not present

## 2019-07-13 DIAGNOSIS — N6310 Unspecified lump in the right breast, unspecified quadrant: Secondary | ICD-10-CM

## 2019-07-25 ENCOUNTER — Ambulatory Visit: Payer: Medicare Other

## 2019-08-14 DIAGNOSIS — F411 Generalized anxiety disorder: Secondary | ICD-10-CM | POA: Diagnosis not present

## 2019-08-14 DIAGNOSIS — F3340 Major depressive disorder, recurrent, in remission, unspecified: Secondary | ICD-10-CM | POA: Diagnosis not present

## 2019-10-11 DIAGNOSIS — F411 Generalized anxiety disorder: Secondary | ICD-10-CM | POA: Diagnosis not present

## 2019-10-11 DIAGNOSIS — F331 Major depressive disorder, recurrent, moderate: Secondary | ICD-10-CM | POA: Diagnosis not present

## 2019-11-01 DIAGNOSIS — F411 Generalized anxiety disorder: Secondary | ICD-10-CM | POA: Diagnosis not present

## 2019-11-01 DIAGNOSIS — F3340 Major depressive disorder, recurrent, in remission, unspecified: Secondary | ICD-10-CM | POA: Diagnosis not present

## 2020-01-29 DIAGNOSIS — M62838 Other muscle spasm: Secondary | ICD-10-CM | POA: Diagnosis present

## 2020-05-14 ENCOUNTER — Other Ambulatory Visit: Payer: Self-pay

## 2020-05-14 ENCOUNTER — Emergency Department (HOSPITAL_COMMUNITY): Payer: Medicare Other

## 2020-05-14 ENCOUNTER — Emergency Department (HOSPITAL_COMMUNITY)
Admission: EM | Admit: 2020-05-14 | Discharge: 2020-05-14 | Disposition: A | Discharge status: Home / Self Care | Payer: Medicare Other | Attending: Emergency Medicine | Admitting: Emergency Medicine

## 2020-05-14 ENCOUNTER — Encounter (HOSPITAL_COMMUNITY): Payer: Self-pay

## 2020-05-14 DIAGNOSIS — R0602 Shortness of breath: Secondary | ICD-10-CM | POA: Insufficient documentation

## 2020-05-14 DIAGNOSIS — Z79899 Other long term (current) drug therapy: Secondary | ICD-10-CM | POA: Insufficient documentation

## 2020-05-14 DIAGNOSIS — R1032 Left lower quadrant pain: Secondary | ICD-10-CM | POA: Insufficient documentation

## 2020-05-14 DIAGNOSIS — Z87891 Personal history of nicotine dependence: Secondary | ICD-10-CM | POA: Diagnosis not present

## 2020-05-14 DIAGNOSIS — M549 Dorsalgia, unspecified: Secondary | ICD-10-CM | POA: Diagnosis present

## 2020-05-14 DIAGNOSIS — M62830 Muscle spasm of back: Secondary | ICD-10-CM | POA: Diagnosis not present

## 2020-05-14 HISTORY — DX: Anxiety disorder, unspecified: F41.9

## 2020-05-14 LAB — MAGNESIUM: Magnesium: 2.2 mg/dL (ref 1.7–2.4)

## 2020-05-14 LAB — URINALYSIS, ROUTINE W REFLEX MICROSCOPIC
Bilirubin Urine: NEGATIVE
Glucose, UA: NEGATIVE mg/dL
Hgb urine dipstick: NEGATIVE
Ketones, ur: NEGATIVE mg/dL
Leukocytes,Ua: NEGATIVE
Nitrite: NEGATIVE
Protein, ur: NEGATIVE mg/dL
Specific Gravity, Urine: 1.035 — ABNORMAL HIGH (ref 1.005–1.030)
pH: 8 (ref 5.0–8.0)

## 2020-05-14 LAB — CBC
HCT: 42.5 % (ref 36.0–46.0)
Hemoglobin: 13.6 g/dL (ref 12.0–15.0)
MCH: 28 pg (ref 26.0–34.0)
MCHC: 32 g/dL (ref 30.0–36.0)
MCV: 87.4 fL (ref 80.0–100.0)
Platelets: 255 10*3/uL (ref 150–400)
RBC: 4.86 MIL/uL (ref 3.87–5.11)
RDW: 13.9 % (ref 11.5–15.5)
WBC: 7.9 10*3/uL (ref 4.0–10.5)
nRBC: 0 % (ref 0.0–0.2)

## 2020-05-14 LAB — COMPREHENSIVE METABOLIC PANEL
ALT: 22 U/L (ref 0–44)
AST: 22 U/L (ref 15–41)
Albumin: 4.2 g/dL (ref 3.5–5.0)
Alkaline Phosphatase: 59 U/L (ref 38–126)
Anion gap: 9 (ref 5–15)
BUN: 12 mg/dL (ref 6–20)
CO2: 23 mmol/L (ref 22–32)
Calcium: 8.8 mg/dL — ABNORMAL LOW (ref 8.9–10.3)
Chloride: 105 mmol/L (ref 98–111)
Creatinine, Ser: 0.65 mg/dL (ref 0.44–1.00)
GFR calc Af Amer: >60 mL/min (ref >60)
GFR calc non Af Amer: >60 mL/min (ref >60)
Glucose, Bld: 101 mg/dL — ABNORMAL HIGH (ref 70–99)
Potassium: 4 mmol/L (ref 3.5–5.1)
Sodium: 137 mmol/L (ref 135–145)
Total Bilirubin: 0.5 mg/dL (ref 0.3–1.2)
Total Protein: 7 g/dL (ref 6.5–8.1)

## 2020-05-14 LAB — LIPASE, BLOOD: Lipase: 23 U/L (ref 11–51)

## 2020-05-14 LAB — I-STAT BETA HCG BLOOD, ED (MC, WL, AP ONLY): I-stat hCG, quantitative: <5 m[IU]/mL (ref <5)

## 2020-05-14 MED ORDER — LORAZEPAM 2 MG/ML IJ SOLN
1.0000 mg | Freq: Once | INTRAMUSCULAR | Status: AC
  Administered 2020-05-14: 1 mg via INTRAVENOUS
  Filled 2020-05-14: qty 1

## 2020-05-14 MED ORDER — SODIUM CHLORIDE 0.9% FLUSH: 3.0000 mL | Freq: Once | INTRAVENOUS | Status: DC

## 2020-05-14 MED ORDER — IOHEXOL 300 MG/ML  SOLN
100.0000 mL | Freq: Once | INTRAMUSCULAR | Status: AC | PRN
  Administered 2020-05-14: 100 mL via INTRAVENOUS

## 2020-05-14 MED ORDER — SODIUM CHLORIDE (PF) 0.9 % IJ SOLN
INTRAMUSCULAR | Status: AC
  Filled 2020-05-14: qty 50

## 2020-05-14 NOTE — ED Triage Notes (Signed)
Patient c/o bilateral lower back pain, SOB, LLQ abdominal pain, and nausea since last night.

## 2020-05-14 NOTE — ED Provider Notes (Signed)
[facility]-EMERGENCY DEPT Provider Note   CSN: [id number] Arrival date & time: 05/14/20  1241     History Chief Complaint  Patient presents with  . Shortness of Breath  . Back Pain  . Nausea  . Abdominal Pain    Natasha [name] is a 49 y.o. female.  HPI HPI Comments: Natasha [name] is a 49 y.o. female who presents to the Emergency Department complaining of muscle spasms.  Patient states that her mother passed away 5 days ago.  She was very stressed for the last few days.  Last night she started experiencing worsening muscle spasms across the back.  She additionally notes some associated diaphoresis, chills, chest tightness, shortness of breath, mild left lower quadrant pain which she described as stabbing, intermittent nausea without vomiting, lightheadedness.  She confirms her listed medications.  She has a significant history of anxiety.  She has PRN Klonopin which she has not taken.  She has a history of C6 spinal cord injury in 2002 from a suicide attempt and has multiple chronic deficits due to this, none of which have acutely worsened.  She denies leg swelling, diarrhea, constipation, hematochezia, dysuria, syncope, SI/HI.     Past Medical History:  Diagnosis Date  . Anxiety   . C6 spinal cord injury (HCC)   . Depression     Patient Active Problem List   Diagnosis Date Noted  . Paraplegia (HCC) 10/21/2017  . GAD (generalized anxiety disorder) 10/20/2017  . Major depressive disorder, recurrent, severe without psychotic features [facility])     Past Surgical History:  Procedure Laterality Date  . BACK SURGERY    . NECK SURGERY       OB History   No obstetric history on file.     Family History  Problem Relation Age of Onset  . Alcohol abuse Father   . Alcohol abuse Brother   . Depression Mother   . Alcohol abuse Mother   . Parkinson's disease Mother     Social History   Tobacco Use  . Smoking status: Former Games developer  .  Smokeless tobacco: Never Used  . Tobacco comment: No smoking hx; no need for cessation materials  Substance Use Topics  . Alcohol use: Yes    Alcohol/week: 0.0 standard drinks    Comment: Occasional use  . Drug use: No    Home Medications Prior to Admission medications   Medication Sig Start Date End Date Taking? Authorizing Provider  baclofen (LIORESAL) 10 MG tablet Take 10 mg by mouth 3 (three) times daily. 04/16/20  Yes [provider]  busPIRone (BUSPAR) 10 MG tablet Take 1 tablet (10 mg total) by mouth 3 (three) times daily. 10/03/18  Yes Hisada, Barbee Cough, MD  clonazePAM (KLONOPIN) 0.5 MG tablet Take 1 tablet (0.5 mg total) by mouth 2 (two) times daily as needed (anxiety). 03/10/18  Yes Hisada, Barbee Cough, MD  DULoxetine (CYMBALTA) 60 MG capsule Take 1 capsule (60 mg total) by mouth daily. 01/11/19  Yes Neysa Hotter, MD  Multiple Vitamin (MULTIVITAMIN ADULT) TABS Take 1 tablet by mouth daily.   Yes [provider]  QUEtiapine (SEROQUEL) 50 MG tablet Take 50 mg by mouth at bedtime. 04/16/20  Yes [provider]  traZODone (DESYREL) 50 MG tablet Take 50 mg by mouth at bedtime.  04/16/20  Yes [provider]  QUEtiapine (SEROQUEL) 25 MG tablet Take 1 tablet (25 mg total) by mouth 2 (two) times daily. Patient not taking: Reported on 05/14/2020 01/11/19  Neysa Hotter, MD  traZODone (DESYREL) 100 MG tablet 100-200 mg at night as needed for sleep Patient not taking: Reported on 05/14/2020 07/03/18   Neysa Hotter, MD    Allergies    Patient has no known allergies.  Review of Systems   Review of Systems  All other systems reviewed and are negative. Ten systems reviewed and are negative for acute change, except as noted in the HPI.    Physical Exam Updated Vital Signs BP (!) 142/86 (BP Location: Left Arm)   Pulse 90   Temp 98.2 F (36.8 C) (Oral)   Resp (!) 21   Ht 5' (1.524 m)   Wt 63.5 kg   LMP 05/12/2020   SpO2 100%   BMI 27.34 kg/m   Physical  Exam Vitals and nursing note reviewed.  Constitutional:      General: She is in acute distress.     Appearance: Normal appearance. She is well-developed. She is not ill-appearing, toxic-appearing or diaphoretic.  HENT:     Head: Normocephalic and atraumatic.     Right Ear: External ear normal.     Left Ear: External ear normal.     Nose: Nose normal.     Mouth/Throat:     Mouth: Mucous membranes are moist.     Pharynx: Oropharynx is clear. No oropharyngeal exudate or posterior oropharyngeal erythema.  Eyes:     Extraocular Movements: Extraocular movements intact.  Cardiovascular:     Rate and Rhythm: Normal rate and regular rhythm.     Pulses: Normal pulses.     Heart sounds: Normal heart sounds. No murmur. No friction rub. No gallop.   Pulmonary:     Effort: Pulmonary effort is normal. No tachypnea, bradypnea, accessory muscle usage or respiratory distress.     Breath sounds: Normal breath sounds. No stridor. No decreased breath sounds, wheezing, rhonchi or rales.  Abdominal:     General: Abdomen is flat. Bowel sounds are normal.     Palpations: Abdomen is soft.     Tenderness: There is abdominal tenderness.     Comments: Mild left lower quadrant tenderness noted with deep palpation.  Abdomen is soft.  No rebound.  No other regions of tenderness in the abdomen.  Musculoskeletal:        General: Normal range of motion.     Cervical back: Normal range of motion and neck supple. No tenderness.     Right lower leg: No tenderness. No edema.     Left lower leg: No tenderness. No edema.     Comments: Palpable muscle spasms noted throughout the thoracic and lumbar regions.  Mild midline lumbar spine tenderness.  Exquisite tenderness noted to the paraspinal musculature in the lumbar region bilaterally.  Skin:    General: Skin is warm and dry.  Neurological:     Mental Status: She is alert and oriented to person, place, and time.     Comments: Patient has a history of partial C6 injury.   Patient has baseline weakness in all 4 extremities.  She is able to ambulate slowly with aid of a forearm crutch.  She can sit upright from supine position.  Her distal sensation is intact.  She denies any acute weakness.  Psychiatric:        Mood and Affect: Mood normal.        Behavior: Behavior normal.    ED Results / Procedures / Treatments   Labs (all labs ordered are listed, but only abnormal results are displayed) Labs  Reviewed  COMPREHENSIVE METABOLIC PANEL - Abnormal; Notable for the following components:      Result Value   Glucose, Bld 101 (*)    Calcium 8.8 (*)    All other components within normal limits  LIPASE, BLOOD  CBC  MAGNESIUM  URINALYSIS, ROUTINE W REFLEX MICROSCOPIC  I-STAT BETA HCG BLOOD, ED ([facility], [facility], [facility] ONLY)   EKG None  Radiology DG Chest 2 View  Result Date: 05/14/2020 CLINICAL DATA:  Shortness of breath, low back pain and nausea beginning last night. EXAM: CHEST - 2 VIEW COMPARISON:  PA and lateral chest 10/21/2017. FINDINGS: Lungs clear. Heart size normal. No pneumothorax or pleural fluid. No acute or focal bony abnormality. The patient is status post cervical fusion. IMPRESSION: Negative chest. Electronically Signed   By: Inge Rise M.D.   On: 05/14/2020 13:52   Procedures Procedures (including critical care time)  Medications Ordered in ED Medications  sodium chloride flush (NS) 0.9 % injection 3 mL (has no administration in time range)  sodium chloride (PF) 0.9 % injection (has no administration in time range)  LORazepam (ATIVAN) injection 1 mg (1 mg Intravenous Given 05/14/20 1522)  iohexol (OMNIPAQUE) 300 MG/ML solution 100 mL (100 mLs Intravenous Contrast Given 05/14/20 1731)   ED Course  I have reviewed the triage vital signs and the nursing notes.  Pertinent labs & imaging results that were available during my care of the patient were reviewed by me and considered in my medical decision making (see chart for details).  Clinical Course  as of May 15 1747  Wed May 14, 2020  1540 Patient is a 48 year old female with history of C6 injury that presents with a variety of complaints.  Initial labs are reassuring.  We discussed these findings as well as her chest x-ray.  She understands they are negative.  We discussed the possibility of imaging her abdomen and pelvis due to the left lower quadrant pain.  She is amenable with this.  Patient given 1 mg of Ativan.  We will closely monitor.  Will reassess.   [LJ]  33 I-STAT hCG was negative.  Magnesium is normal.  Patient is being taken back to CT now.  She states her pain is still present but she does feel some mild alleviation with the Ativan.  Will reassess after CT scan.   [LJ]    Clinical Course User Index [LJ] Rayna Sexton, PA-C   MDM Rules/Calculators/A&P                      Patient is a 49 year old female with a history of C6 injury after a suicide attempt in 2002.  She presents today with multiple complaints.  Physical exam is generally reassuring.  Due to patient's general state of health decided to obtain basic labs.  All of these are reassuring.  She was experiencing some left lower quadrant pain.  CT of the abdomen and pelvis with contrast was obtained.  Results are pending.  UA and ECG results are still pending.   I discussed patient's lab results with her in detail. We discussed the fact that she has not been taking her prn klonopin. I recommended that she begin using these for her symptoms.  I also recommended that she follow-up with her primary care doctor tomorrow to discuss her symptoms if they have not began to improve.  It is the end of my shift and patient care will be transferred to [facility]. If CT scan, ecg, and  UA are reassuring, I believe patient is safe to be discharged with strict PCP follow-up.  Final Clinical Impression(s) / ED Diagnoses Final diagnoses:  Muscle spasm of back   Rx / DC Orders ED Discharge Orders    None       Placido Sou, PA-C 05/14/20 1802    Tegeler, Canary Brim, MD 05/15/20 [phone]

## 2020-05-14 NOTE — ED Provider Notes (Signed)
Accepted handoff at shift change from [facility]. Please see prior provider note for more detail.   Briefly: Patient is 49 y.o.   DDX: concern for urinary tract infection or diverticulitis/obstruction  Plan: Follow-up on CT and urinalysis.  Likely disposition will be discharged home unless abnormalities found that require being addressed.    Physical Exam  BP 136/90   Pulse 71   Temp 98.2 F (36.8 C) (Oral)   Resp (!) 23   Ht 5' (1.524 m)   Wt 63.5 kg   LMP 05/12/2020   SpO2 100%   BMI 27.34 kg/m   CONSTITUTIONAL:  well-appearing, NAD NEURO:  Alert and oriented x 3, no focal deficits EYES:  pupils equal and reactive ENT/NECK:  trachea midline, no JVD CARDIO:  reg rate, reg rhythm, well-perfused PULM:  None labored breathing GI/GU:  Abdomen non-distended MSK/SPINE:  No gross deformities, no edema SKIN:  no rash obvious, atraumatic, no ecchymosis  PSYCH:  Appropriate speech and behavior   ED Course/Procedures   Clinical Course as of May 14 1848  Wed May 14, 2020  1540 Patient is a 49 year old female with history of C6 injury that presents with a variety of complaints.  Initial labs are reassuring.  We discussed these findings as well as her chest x-ray.  She understands they are negative.  We discussed the possibility of imaging her abdomen and pelvis due to the left lower quadrant pain.  She is amenable with this.  Patient given 1 mg of Ativan.  We will closely monitor.  Will reassess.   [LJ]  36 I-STAT hCG was negative.  Magnesium is normal.  Patient is being taken back to CT now.  She states her pain is still present but she does feel some mild alleviation with the Ativan.  Will reassess after CT scan.   [LJ]    Clinical Course User Index [LJ] Rayna Sexton, PA-C    Procedures  Results for orders placed or performed during the hospital encounter of 05/14/20  Lipase, blood  Result Value Ref Range   Lipase 23 11 - 51 U/L  Comprehensive metabolic panel   Result Value Ref Range   Sodium 137 135 - 145 mmol/L   Potassium 4.0 3.5 - 5.1 mmol/L   Chloride 105 98 - 111 mmol/L   CO2 23 22 - 32 mmol/L   Glucose, Bld 101 (H) 70 - 99 mg/dL   BUN 12 6 - 20 mg/dL   Creatinine, Ser 0.65 0.44 - 1.00 mg/dL   Calcium 8.8 (L) 8.9 - 10.3 mg/dL   Total Protein 7.0 6.5 - 8.1 g/dL   Albumin 4.2 3.5 - 5.0 g/dL   AST 22 15 - 41 U/L   ALT 22 0 - 44 U/L   Alkaline Phosphatase 59 38 - 126 U/L   Total Bilirubin 0.5 0.3 - 1.2 mg/dL   GFR calc non Af Amer >60 >60 mL/min   GFR calc Af Amer >60 >60 mL/min   Anion gap 9 5 - 15  CBC  Result Value Ref Range   WBC 7.9 4.0 - 10.5 K/uL   RBC 4.86 3.87 - 5.11 MIL/uL   Hemoglobin 13.6 12.0 - 15.0 g/dL   HCT 42.5 36.0 - 46.0 %   MCV 87.4 80.0 - 100.0 fL   MCH 28.0 26.0 - 34.0 pg   MCHC 32.0 30.0 - 36.0 g/dL   RDW 13.9 11.5 - 15.5 %   Platelets 255 150 - 400 K/uL   nRBC 0.0  0.0 - 0.2 %  Urinalysis, Routine w reflex microscopic  Result Value Ref Range   Color, Urine YELLOW YELLOW   APPearance CLEAR CLEAR   Specific Gravity, Urine 1.035 (H) 1.005 - 1.030   pH 8.0 5.0 - 8.0   Glucose, UA NEGATIVE NEGATIVE mg/dL   Hgb urine dipstick NEGATIVE NEGATIVE   Bilirubin Urine NEGATIVE NEGATIVE   Ketones, ur NEGATIVE NEGATIVE mg/dL   Protein, ur NEGATIVE NEGATIVE mg/dL   Nitrite NEGATIVE NEGATIVE   Leukocytes,Ua NEGATIVE NEGATIVE  Magnesium  Result Value Ref Range   Magnesium 2.2 1.7 - 2.4 mg/dL  I-Stat beta hCG blood, ED  Result Value Ref Range   I-stat hCG, quantitative <5.0 <5 mIU/mL   Comment 3           DG Chest 2 View  Result Date: 05/14/2020 CLINICAL DATA:  Shortness of breath, low back pain and nausea beginning last night. EXAM: CHEST - 2 VIEW COMPARISON:  PA and lateral chest 10/21/2017. FINDINGS: Lungs clear. Heart size normal. No pneumothorax or pleural fluid. No acute or focal bony abnormality. The patient is status post cervical fusion. IMPRESSION: Negative chest. Electronically Signed   By: Drusilla Kanner M.D.   On: 05/14/2020 13:52   CT ABDOMEN PELVIS W CONTRAST  Result Date: 05/14/2020 CLINICAL DATA:  49 year old female with left lower quadrant abdominal pain and low back pain. EXAM: CT ABDOMEN AND PELVIS WITH CONTRAST TECHNIQUE: Multidetector CT imaging of the abdomen and pelvis was performed using the standard protocol following bolus administration of intravenous contrast. CONTRAST:  OMNIPAQUE IOHEXOL 300 MG/ML  SOLN COMPARISON:  None. FINDINGS: Lower chest: The visualized lung bases are clear. No intra-abdominal free air or free fluid. Hepatobiliary: No focal liver abnormality is seen. No gallstones, gallbladder wall thickening, or biliary dilatation. Pancreas: Unremarkable. No pancreatic ductal dilatation or surrounding inflammatory changes. Spleen: Normal in size without focal abnormality. Adrenals/Urinary Tract: Adrenal glands are unremarkable. Kidneys are normal, without renal calculi, focal lesion, or hydronephrosis. Bladder is unremarkable. Stomach/Bowel: There is moderate stool throughout the colon. There is no bowel obstruction or active inflammation. The appendix is normal. Vascular/Lymphatic: The abdominal aorta and IVC are unremarkable. No portal venous gas. There is no adenopathy. Reproductive: The uterus is grossly unremarkable. There is a 2 cm left ovarian dominant follicle or corpus luteum. The right ovary is unremarkable. Other: None Musculoskeletal: No acute or significant osseous findings. IMPRESSION: 1. A 2 cm left ovarian dominant follicle/cyst or corpus luteum. 2. No bowel obstruction. Normal appendix. Electronically Signed   By: Elgie Collard M.D.   On: 05/14/2020 18:02     MDM   CT scan without any acute abnormality.  There is 2 cm left ovarian follicle.  Otherwise normal CT scan. Patient has mentions palpation of my exam.  Suspicion for torsion as patient is well-appearing at this time and denies any pain.  Will recommend close follow-up with PCP.  I discussed  return precautions.  She is understanding of plan at this time.  She is tolerating p.o. has no nausea.  Benign abdominal exam and reassuring labs and urine.  No evidence of infection on urinalysis.  We will discharge at this time.      Solon Augusta Harriman, [place] 05/14/20 1853    Lorre Nick, MD 05/14/20 2222

## 2020-05-14 NOTE — ED Notes (Signed)
Pt a/o and ambulatory at baseline. Family member present at discharge no questions.

## 2020-05-14 NOTE — Discharge Instructions (Addendum)
Please follow-up with your primary care provider tomorrow morning.  I would recommend you start using your Klonopin that was prescribed to you as needed.   If your symptoms worsen you can always return to the emergency department for reevaluation.

## 2020-05-25 ENCOUNTER — Inpatient Hospital Stay (HOSPITAL_COMMUNITY)
Admit: 2020-05-25 | Discharge: 2020-05-28 | DRG: 885 | Disposition: A | Discharge status: Home / Self Care | Payer: Medicare Other | Attending: Psychiatry | Admitting: Psychiatry

## 2020-05-25 ENCOUNTER — Other Ambulatory Visit: Payer: Self-pay

## 2020-05-25 ENCOUNTER — Encounter (HOSPITAL_COMMUNITY): Payer: Self-pay | Admitting: Psychiatry

## 2020-05-25 DIAGNOSIS — F411 Generalized anxiety disorder: Secondary | ICD-10-CM | POA: Diagnosis present

## 2020-05-25 DIAGNOSIS — F332 Major depressive disorder, recurrent severe without psychotic features: Principal | ICD-10-CM | POA: Diagnosis present

## 2020-05-25 DIAGNOSIS — Z811 Family history of alcohol abuse and dependence: Secondary | ICD-10-CM | POA: Diagnosis not present

## 2020-05-25 DIAGNOSIS — G4710 Hypersomnia, unspecified: Secondary | ICD-10-CM | POA: Diagnosis present

## 2020-05-25 DIAGNOSIS — Z820 Family history of epilepsy and other diseases of the nervous system: Secondary | ICD-10-CM

## 2020-05-25 DIAGNOSIS — G8220 Paraplegia, unspecified: Secondary | ICD-10-CM | POA: Diagnosis present

## 2020-05-25 DIAGNOSIS — Z87891 Personal history of nicotine dependence: Secondary | ICD-10-CM | POA: Diagnosis not present

## 2020-05-25 DIAGNOSIS — R45851 Suicidal ideations: Secondary | ICD-10-CM | POA: Diagnosis present

## 2020-05-25 DIAGNOSIS — Z20822 Contact with and (suspected) exposure to covid-19: Secondary | ICD-10-CM | POA: Diagnosis present

## 2020-05-25 DIAGNOSIS — Z915 Personal history of self-harm: Secondary | ICD-10-CM | POA: Diagnosis not present

## 2020-05-25 DIAGNOSIS — Z818 Family history of other mental and behavioral disorders: Secondary | ICD-10-CM | POA: Diagnosis not present
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
## 2020-05-26 DIAGNOSIS — F332 Major depressive disorder, recurrent severe without psychotic features: Secondary | ICD-10-CM | POA: Diagnosis present

## 2020-05-26 LAB — COMPREHENSIVE METABOLIC PANEL
ALT: 22 U/L (ref 0–44)
AST: 22 U/L (ref 15–41)
Albumin: 4.5 g/dL (ref 3.5–5.0)
Alkaline Phosphatase: 66 U/L (ref 38–126)
Anion gap: 13 (ref 5–15)
BUN: 10 mg/dL (ref 6–20)
CO2: 21 mmol/L — ABNORMAL LOW (ref 22–32)
Calcium: 9.2 mg/dL (ref 8.9–10.3)
Chloride: 101 mmol/L (ref 98–111)
Creatinine, Ser: 0.67 mg/dL (ref 0.44–1.00)
GFR calc Af Amer: >60 mL/min (ref >60)
GFR calc non Af Amer: >60 mL/min (ref >60)
Glucose, Bld: 87 mg/dL (ref 70–99)
Potassium: 4.1 mmol/L (ref 3.5–5.1)
Sodium: 135 mmol/L (ref 135–145)
Total Bilirubin: 1 mg/dL (ref 0.3–1.2)
Total Protein: 7.9 g/dL (ref 6.5–8.1)

## 2020-05-26 LAB — CBC
HCT: 46.3 % — ABNORMAL HIGH (ref 36.0–46.0)
Hemoglobin: 15.1 g/dL — ABNORMAL HIGH (ref 12.0–15.0)
MCH: 28.4 pg (ref 26.0–34.0)
MCHC: 32.6 g/dL (ref 30.0–36.0)
MCV: 87 fL (ref 80.0–100.0)
Platelets: 274 10*3/uL (ref 150–400)
RBC: 5.32 MIL/uL — ABNORMAL HIGH (ref 3.87–5.11)
RDW: 13.9 % (ref 11.5–15.5)
WBC: 10.1 10*3/uL (ref 4.0–10.5)
nRBC: 0 % (ref 0.0–0.2)

## 2020-05-26 LAB — HEMOGLOBIN A1C
Hgb A1c MFr Bld: 5.3 % (ref 4.8–5.6)
Mean Plasma Glucose: 105.41 mg/dL

## 2020-05-26 LAB — T4, FREE: Free T4: 1.04 ng/dL (ref 0.61–1.12)

## 2020-05-26 LAB — LIPID PANEL
Cholesterol: 224 mg/dL — ABNORMAL HIGH (ref 0–200)
HDL: 43 mg/dL (ref >40)
LDL Cholesterol: 160 mg/dL — ABNORMAL HIGH (ref 0–99)
Total CHOL/HDL Ratio: 5.2 RATIO
Triglycerides: 105 mg/dL (ref <150)
VLDL: 21 mg/dL (ref 0–40)

## 2020-05-26 LAB — SARS CORONAVIRUS 2 BY RT PCR (HOSPITAL ORDER, PERFORMED IN ~~LOC~~ HOSPITAL LAB): SARS Coronavirus 2: NEGATIVE

## 2020-05-26 LAB — TSH: TSH: 8.467 u[IU]/mL — ABNORMAL HIGH (ref 0.350–4.500)

## 2020-05-26 MED ORDER — MAGNESIUM HYDROXIDE 400 MG/5ML PO SUSP: 30.0000 mL | Freq: Every day | ORAL | Status: DC | PRN

## 2020-05-26 MED ORDER — TRAZODONE HCL 50 MG PO TABS
50.0000 mg | ORAL_TABLET | Freq: Every day | ORAL | Status: DC
  Administered 2020-05-26: 50 mg via ORAL
  Filled 2020-05-26 (×4): qty 1

## 2020-05-26 MED ORDER — ACETAMINOPHEN 325 MG PO TABS
650.0000 mg | ORAL_TABLET | Freq: Four times a day (QID) | ORAL | Status: DC | PRN
  Administered 2020-05-26 – 2020-05-27 (×3): 650 mg via ORAL
  Filled 2020-05-26 (×3): qty 2

## 2020-05-26 MED ORDER — DULOXETINE HCL 60 MG PO CPEP
60.0000 mg | ORAL_CAPSULE | Freq: Two times a day (BID) | ORAL | Status: DC
  Administered 2020-05-26: 60 mg via ORAL
  Filled 2020-05-26 (×5): qty 1

## 2020-05-26 MED ORDER — BUSPIRONE HCL 10 MG PO TABS
10.0000 mg | ORAL_TABLET | Freq: Three times a day (TID) | ORAL | Status: DC
  Administered 2020-05-26 – 2020-05-28 (×6): 10 mg via ORAL
  Filled 2020-05-26: qty 2
  Filled 2020-05-26 (×11): qty 1

## 2020-05-26 MED ORDER — ALUM & MAG HYDROXIDE-SIMETH 200-200-20 MG/5ML PO SUSP: 30.0000 mL | ORAL | Status: DC | PRN

## 2020-05-26 MED ORDER — QUETIAPINE FUMARATE 50 MG PO TABS
50.0000 mg | ORAL_TABLET | Freq: Every day | ORAL | Status: DC
  Administered 2020-05-26 – 2020-05-27 (×3): 50 mg via ORAL
  Filled 2020-05-26 (×6): qty 1

## 2020-05-26 MED ORDER — TRAZODONE HCL 50 MG PO TABS
50.0000 mg | ORAL_TABLET | Freq: Every evening | ORAL | Status: DC | PRN
  Administered 2020-05-26 – 2020-05-27 (×2): 50 mg via ORAL
  Filled 2020-05-26: qty 1

## 2020-05-26 MED ORDER — DULOXETINE HCL 60 MG PO CPEP
60.0000 mg | ORAL_CAPSULE | Freq: Every day | ORAL | Status: DC
  Administered 2020-05-27 – 2020-05-28 (×2): 60 mg via ORAL
  Filled 2020-05-26 (×4): qty 1

## 2020-05-26 MED ORDER — BACLOFEN 10 MG PO TABS
10.0000 mg | ORAL_TABLET | Freq: Three times a day (TID) | ORAL | Status: DC | PRN
  Administered 2020-05-27: 10 mg via ORAL
  Filled 2020-05-26: qty 1

## 2020-05-26 NOTE — H&P (Signed)
Behavioral Health Medical Screening Exam  Natasha [name] is an 49 y.o. female who presents voluntary and accompanied by friend to [facility]. Clinician completed pt's assessment alone. Clinician asked the pt, "what brought you to the hospital?" Pt reported, her mother passed away on May 26, 2020, her depression and anxiety has increased. Pt reported, her mother was suffering from Parkinson's Disease and passed away in her sleep. Pt reported, she can't get up, can't eat; self-harming and suicidal thoughts has returned. Pt reported, she was managing her depression before her mother passed away. Pt reported, "I don't want to be alive if I'm not self-sufficient." Pt reported, having a plan of cutting her wrists again. Pt reported, in 2002 she attempted suicide by jumping off a parking garage and broke her neck. Pt reported she cut her left arm the other day with a kitchen knife. Clinician observed a red mark on pt's left arm, and older marks on both wrists. Pt reported, having a panic attack last week wrists resulted in her going to the hospital. Pt reported the following stressors: mother's death, organizing the services, trying to move (getting people to help). Pt denies, HI, AVH.   Pt denies, substance use. Pt is linked to Arman Bogus, NP and [facility] for medication management and counseling. Pt has a previous inpatient admission at [facility] in 2018.  Total Time spent with patient: 30 minutes  Psychiatric Specialty Exam: Physical Exam  Constitutional: She is oriented to person, place, and time.  Non-toxic appearance. She does not appear ill. No distress.  HENT:  Right Ear: External ear normal.  Left Ear: External ear normal.  Cardiovascular: Normal rate.  Respiratory: Effort normal. No respiratory distress.  Neurological: She is alert and oriented to person, place, and time.  Skin: Skin is warm and dry. She is not diaphoretic.     Psychiatric: Her mood appears anxious. Thought content is  not paranoid and not delusional. She exhibits a depressed mood. She expresses suicidal ideation. She expresses no homicidal ideation. She expresses suicidal plans.   Review of Systems  Constitutional: Positive for activity change and appetite change. Negative for chills, diaphoresis, fatigue, fever and unexpected weight change.  HENT: Negative for congestion.   Respiratory: Negative for cough, chest tightness and shortness of breath.   Gastrointestinal: Negative for diarrhea, nausea and vomiting.  Musculoskeletal: Positive for arthralgias, back pain and myalgias.  Neurological: Negative for dizziness.  Psychiatric/Behavioral: Positive for decreased concentration, dysphoric mood, self-injury, sleep disturbance and suicidal ideas. The patient is nervous/anxious.   All other systems reviewed and are negative.  Blood pressure (!) 124/98, pulse 75, temperature 98.7 F (37.1 C), temperature source Oral, resp. rate 16, last menstrual period 05/12/2020, SpO2 100 %.There is no height or weight on file to calculate BMI. General Appearance: Casual and Fairly Groomed Eye Contact:  Fair Speech:  Clear and Coherent and Normal Rate Volume:  Decreased Mood:  Anxious, Dysphoric, Hopeless and Worthless Affect:  Congruent and Depressed Thought Process:  Coherent, Goal Directed, Linear and Descriptions of Associations: Intact Orientation:  Full (Time, Place, and Person) Thought Content:  Rumination Suicidal Thoughts:  Yes.  with intent/plan Homicidal Thoughts:  No Memory:  Immediate;   Fair Recent;   Fair Remote;   Fair Judgement:  Intact Insight:  Lacking Psychomotor Activity:  Normal Concentration: Concentration: Fair and Attention Span: Fair Recall:  [employer] of Knowledge:Fair Language: Good Akathisia:  Negative Handed:  Right AIMS (if indicated):    Assets:  Communication Skills Desire for  Improvement Housing Leisure Time Sleep:     Musculoskeletal: Strength & Muscle Tone: within normal  limits Gait & Station: normal Patient leans: N/A  Blood pressure (!) 124/98, pulse 75, temperature 98.7 F (37.1 C), temperature source Oral, resp. rate 16, last menstrual period 05/12/2020, SpO2 100 %.  Recommendations: Based on my evaluation the patient does not appear to have an emergency medical condition.  Rozetta Nunnery, NP 05/26/2020, 12:27 AM

## 2020-05-26 NOTE — H&P (Addendum)
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 She reports neuro-vegetative symptoms to include hypersomnia, decreased energy level, decreased appetite, some anhedonia. Denies psychotic symptoms. She reports that she had been doing well/ not depressed and functioning well in her daily activities up to recently when her mother passed away 2020-06-07. States " everything seems to have gone down hill since then".She reports increased anxiety and depression since then.  She reports that she had been doing well on her psychiatric medication regimen and overall had been stable and relatively symptom free for two years preceding death of mother. 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 Denies other medical illnesses, NKDA. Does not smoke  She does report past history of being on Synthroid several years ago, but thinks it was for depression Home medications - Buspar 10 mgrs TID, Cymbalta 60 mgrs QDAY , which had recently been increased to 60 mgrs BID a few  days prior to admission, Seroquel 50 mgrs QHS, Trazodone 50 mgrs QHS .   Associated Signs/Symptoms: Depression Symptoms:  depressed mood, anhedonia, hypersomnia, fatigue, suicidal thoughts with specific plan, decreased appetite, (Hypo) Manic  Symptoms:  denies Anxiety Symptoms:  Excessive Worry, Psychotic Symptoms:  denies PTSD Symptoms: Negative Total Time spent with patient: 30 minutes  Past Psychiatric History: History of prior psychiatric admissions for depression. Most recent admission was 10/2017. At the time was admitted for depression/SI. She has been treated with ECT in the past ( 2018)  History of MDD, history of prior suicide attempts, most recent 2002 by jumping from a height. Denies history of mania or hypomania. No history of psychosis.   Is the patient at risk to self? Yes.    Has the patient been a risk to self in the past 6 months? No.  Has the patient been a risk to self within the distant past? Yes.    Is the patient a risk to others? No.  Has the patient been a risk to others in the past 6 months? No.  Has the patient been a risk to others within the distant past? No.   Prior Inpatient Therapy: Prior Inpatient Therapy: Yes Prior Therapy Dates: 2018. Prior Therapy Facilty/Provider(s): [facility] Reason for Treatment: Depression, SI, Anxiety.  Prior Outpatient Therapy: Prior Outpatient Therapy: Yes Prior Therapy Dates: Current. Prior Therapy Facilty/Provider(s): Elissa Hefty, NP and [facility] Reason for Treatment: Medication management.  Does patient have an ACCT team?: No Does patient have Intensive In-House Services?  : No Does patient have [facility] services? : No Does patient have P4CC services?: No  Alcohol Screening: 1. How often do you have a drink containing alcohol?: Never 2. How many drinks containing alcohol do you have on a typical day when you are drinking?: 1 or 2 3. How often do you have six or more drinks on one occasion?: Never AUDIT-C Score: 0 4. How often during the last year have you found that you were not  able to stop drinking once you had started?: Never 5. How often during the last year have you failed to do what was normally expected from you because of drinking?: Never 6. How  often during the last year have you needed a first drink in the morning to get yourself going after a heavy drinking session?: Never 7. How often during the last year have you had a feeling of guilt of remorse after drinking?: Never 8. How often during the last year have you been unable to remember what happened the night before because you had been drinking?: Never 9. Have you or someone else been injured as a result of your drinking?: No 10. Has a relative or friend or a doctor or another health worker been concerned about your drinking or suggested you cut down?: No Alcohol Use Disorder Identification Test Final Score (AUDIT): 0 Alcohol Brief Interventions/Follow-up: AUDIT Score <7 follow-up not indicated Substance Abuse History in the last 12 months:  No. Consequences of Substance Abuse: NA Previous Psychotropic Medications: Yes  Psychological Evaluations: No  Past Medical History:  Past Medical History:  Diagnosis Date  . Anxiety   . C6 spinal cord injury ([place])   . Depression     Past Surgical History:  Procedure Laterality Date  . BACK SURGERY    . NECK SURGERY     Family History:  Family History  Problem Relation Age of Onset  . Alcohol abuse Father   . Alcohol abuse Brother   . Depression Mother   . Alcohol abuse Mother   . Parkinson's disease Mother    Family Psychiatric  History: Parents and brother with substance use disorders. Maternal grandmother with OCD and bipolar disorder. Great grandmother died by suicide.  Tobacco Screening:   Social History:  Social History   Substance and Sexual Activity  Alcohol Use Yes  . Alcohol/week: 0.0 standard drinks   Comment: Occasional use     Social History   Substance and Sexual Activity  Drug Use No    Additional Social History: Marital status: Single    Pain Medications: See MAR Prescriptions: See MAR Over the Counter: See MAR History of alcohol / drug use?: No history of alcohol / drug abuse                     Allergies:  No Known Allergies Lab Results:  Results for orders placed or performed during the hospital encounter of 05/25/20 (from the past 48 hour(s))  SARS Coronavirus 2 by RT PCR (hospital order, performed in [facility] hospital lab) Nasopharyngeal Nasopharyngeal Swab     Status: None   Collection Time: 05/25/20  9:43 PM   Specimen: Nasopharyngeal Swab  Result Value Ref Range   SARS Coronavirus 2 NEGATIVE NEGATIVE    Comment: (NOTE) SARS-CoV-2 target nucleic acids are NOT DETECTED.  The SARS-CoV-2 RNA is generally detectable in upper and lower respiratory specimens during the acute phase of infection. The lowest concentration of SARS-CoV-2 viral copies this assay can detect is 250 copies / mL. A negative result does not preclude SARS-CoV-2 infection and should not be used as the sole basis for treatment or other patient management decisions.  A negative result may occur with improper specimen collection / handling, submission of specimen other than nasopharyngeal swab, presence of viral mutation(s) within the areas targeted by this assay, and inadequate number of viral copies (<250 copies / mL). A negative result must be combined with clinical observations, patient history, and epidemiological information.  Fact Sheet for Patients:   StrictlyIdeas.no  Fact Sheet for Healthcare Providers: BankingDealers.co.za  This test is not yet approved or  cleared by the [place] FDA and has been authorized for detection and/or diagnosis of SARS-CoV-2 by FDA under an Emergency Use Authorization (EUA).  This EUA will remain in effect (meaning this test can be used) for the duration of the COVID-19 declaration under Section 564(b)(1) of the Act, 21 U.S.C. section 360bbb-3(b)(1), unless the authorization is terminated or revoked sooner.  Performed at [facility], [place] [address], [place], [place] [address]    CBC     Status: Abnormal   Collection Time: 05/26/20  6:38 AM  Result Value Ref Range   WBC 10.1 4.0 - 10.5 K/uL   RBC 5.32 (H) 3.87 - 5.11 MIL/uL   Hemoglobin 15.1 (H) 12.0 - 15.0 g/dL   HCT 46.3 (H) 36 - 46 %   MCV 87.0 80.0 - 100.0 fL   MCH 28.4 26.0 - 34.0 pg   MCHC 32.6 30.0 - 36.0 g/dL   RDW 13.9 11.5 - 15.5 %   Platelets 274 150 - 400 K/uL   nRBC 0.0 0.0 - 0.2 %    Comment: Performed at [facility], [place] [address], [place], [place] [address]  Comprehensive metabolic panel     Status: Abnormal   Collection Time: 05/26/20  6:38 AM  Result Value Ref Range   Sodium 135 135 - 145 mmol/L   Potassium 4.1 3.5 - 5.1 mmol/L   Chloride 101 98 - 111 mmol/L   CO2 21 (L) 22 - 32 mmol/L   Glucose, Bld 87 70 - 99 mg/dL    Comment: Glucose reference range applies only to samples taken after fasting for at least 8 hours.   BUN 10 6 - 20 mg/dL   Creatinine, Ser 0.67 0.44 - 1.00 mg/dL   Calcium 9.2 8.9 - 10.3 mg/dL   Total Protein 7.9 6.5 - 8.1 g/dL   Albumin 4.5 3.5 - 5.0 g/dL   AST 22 15 - 41 U/L   ALT 22 0 - 44 U/L   Alkaline Phosphatase 66 38 - 126 U/L   Total Bilirubin 1.0 0.3 - 1.2 mg/dL   GFR calc non Af Amer >60 >60 mL/min   GFR calc Af Amer >60 >60 mL/min   Anion gap 13 5 - 15    Comment: Performed at [facility], [place] [address], [place], [place] [address]  Hemoglobin A1c     Status: None   Collection Time: 05/26/20  6:38 AM  Result Value Ref Range   Hgb A1c MFr Bld 5.3 4.8 - 5.6 %    Comment: (NOTE) Pre diabetes:          5.7%-6.4%  Diabetes:              >6.4%  Glycemic control for   <7.0% adults with diabetes    Mean Plasma Glucose 105.41 mg/dL    Comment: Performed at [facility] [address], [place], [place] [address]  Lipid panel     Status: Abnormal   Collection Time: 05/26/20  6:38 AM  Result Value Ref Range   Cholesterol 224 (H) 0 - 200 mg/dL   Triglycerides 105 <150 mg/dL   HDL 43 >40 mg/dL   Total  CHOL/HDL Ratio 5.2 RATIO   VLDL 21 0 - 40 mg/dL   LDL Cholesterol 160 (H) 0 - 99 mg/dL    Comment:  Total Cholesterol/HDL:CHD Risk Coronary Heart Disease Risk Table                     Men   Women  1/2 Average Risk   3.4   3.3  Average Risk       5.0   4.4  2 X Average Risk   9.6   7.1  3 X Average Risk  23.4   11.0        Use the calculated Patient Ratio above and the CHD Risk Table to determine the patient's CHD Risk.        ATP III CLASSIFICATION (LDL):  <100     mg/dL   Optimal  100-129  mg/dL   Near or Above                    Optimal  130-159  mg/dL   Borderline  160-189  mg/dL   High  >190     mg/dL   Very High Performed at [facility] [address] [place], [place] [address]   TSH     Status: Abnormal   Collection Time: 05/26/20  6:38 AM  Result Value Ref Range   TSH 8.467 (H) 0.350 - 4.500 uIU/mL    Comment: Performed by a 3rd Generation assay with a functional sensitivity of <=0.01 uIU/mL. Performed at [facility], [place] [address], [place], [place] [address]     Blood Alcohol level:  Lab Results  Component Value Date   [facility] <10 10/18/2017   ETH <5 72/08/4708    Metabolic Disorder Labs:  Lab Results  Component Value Date   HGBA1C 5.3 05/26/2020   MPG 105.41 05/26/2020   MPG 88.19 10/23/2017   Lab Results  Component Value Date   PROLACTIN 121.9 (H) 02/03/2017   Lab Results  Component Value Date   CHOL 224 (H) 05/26/2020   TRIG 105 05/26/2020   HDL 43 05/26/2020   CHOLHDL 5.2 05/26/2020   VLDL 21 05/26/2020   LDLCALC 160 (H) 05/26/2020   LDLCALC 124 (H) 10/23/2017    Current Medications: Current Facility-Administered Medications  Medication Dose Route Frequency Provider Last Rate Last Admin  . acetaminophen (TYLENOL) tablet 650 mg  650 mg Oral Q6H PRN Rozetta Nunnery, NP      . alum & mag hydroxide-simeth (MAALOX/MYLANTA) 200-200-20 MG/5ML suspension 30 mL  30 mL Oral Q4H PRN Lindon Romp A, NP       . busPIRone (BUSPAR) tablet 10 mg  10 mg Oral TID Yaqueline Gutter, [name], MD      . Derrill Memo ON 05/27/2020] DULoxetine (CYMBALTA) DR capsule 60 mg  60 mg Oral Daily Baylin Gamblin A, MD      . magnesium hydroxide (MILK OF MAGNESIA) suspension 30 mL  30 mL Oral Daily PRN Lindon Romp A, NP      . QUEtiapine (SEROQUEL) tablet 50 mg  50 mg Oral QHS Lindon Romp A, NP   50 mg at 05/26/20 0201  . traZODone (DESYREL) tablet 50 mg  50 mg Oral QHS Lindon Romp A, NP   50 mg at 05/26/20 0201   PTA Medications: Medications Prior to Admission  Medication Sig Dispense Refill Last Dose  . baclofen (LIORESAL) 10 MG tablet Take 10 mg by mouth 3 (three) times daily.   Past Week at Unknown time  . busPIRone (BUSPAR) 10 MG tablet Take 1 tablet (10 mg total) by mouth 3 (three) times daily. 270 tablet  0 Past Week at Unknown time  . clonazePAM (KLONOPIN) 0.5 MG tablet Take 1 tablet (0.5 mg total) by mouth 2 (two) times daily as needed (anxiety). 60 tablet 0 Past Week at Unknown time  . DULoxetine (CYMBALTA) 60 MG capsule Take 1 capsule (60 mg total) by mouth daily. (Patient taking differently: Take 60 mg by mouth 2 (two) times daily. ) 90 capsule 0 Past Week at Unknown time  . Multiple Vitamin (MULTIVITAMIN ADULT) TABS Take 1 tablet by mouth daily.   Past Week at Unknown time  . QUEtiapine (SEROQUEL) 50 MG tablet Take 50 mg by mouth at bedtime.   Past Week at Unknown time  . traZODone (DESYREL) 50 MG tablet Take 50 mg by mouth at bedtime.    Past Week at Unknown time    Musculoskeletal: Strength & Muscle Tone: within normal limits Gait & Station: normal Patient leans: N/A  Psychiatric Specialty Exam: Physical Exam  Nursing note and vitals reviewed. Constitutional: She is oriented to person, place, and time. She appears well-developed.  Cardiovascular: Normal rate.  Respiratory: Effort normal.  Neurological: She is alert and oriented to person, place, and time.    Review of Systems  Constitutional: Negative.    Respiratory: Negative for cough and shortness of breath.   Gastrointestinal: Positive for nausea. Negative for diarrhea and vomiting.  Neurological: Positive for headaches. Negative for tremors and seizures.  Psychiatric/Behavioral: Negative for agitation, behavioral problems, confusion, dysphoric mood, hallucinations, self-injury, sleep disturbance and suicidal ideas. The patient is not nervous/anxious and is not hyperactive.     Blood pressure (!) 135/95, pulse 69, temperature 98.7 F (37.1 C), temperature source Oral, resp. rate 18, height 5' 0.5" (1.537 m), weight 68.5 kg, last menstrual period 05/12/2020, SpO2 100 %.Body mass index is 29 kg/m.  General Appearance: Casual  Eye Contact:  Good  Speech:  Normal Rate  Volume:  Normal  Mood:  Depressed  Affect:  Congruent  Thought Process:  Coherent  Orientation:  Full (Time, Place, and Person)  Thought Content:  Logical  Suicidal Thoughts:  No  Homicidal Thoughts:  No  Memory:  Immediate;   Good Recent;   Good Remote;   Good  Judgement:  Intact  Insight:  Fair  Psychomotor Activity:  Normal  Concentration:  Concentration: Fair and Attention Span: Fair  Recall:  Good  Fund of Knowledge:  Fair  Language:  Good  Akathisia:  No  Handed:  Right  AIMS (if indicated):     Assets:  Communication Skills Desire for Improvement Housing Resilience  ADL's:  Intact  Cognition:  WNL  Sleep:  Number of Hours: 1    Treatment Plan Summary: Daily contact with patient to assess and evaluate symptoms and progress in treatment and Medication management   Inpatient hospitalization.  See MD's admission SRA for medication management.  Patient will participate in the therapeutic group milieu.  Discharge disposition in progress.   Observation Level/Precautions:  15 minute checks  Laboratory:  T3, T4, UDS  Psychotherapy:  Group therapy  Medications:  See MAR  Consultations:  PRN  Discharge Concerns:  Safety and stabilization   Estimated LOS: 3-5 days  Other:     Physician Treatment Plan for Primary Diagnosis: <principal problem not specified> Long Term Goal(s): Improvement in symptoms so as ready for discharge  Short Term Goals: Ability to identify changes in lifestyle to reduce recurrence of condition will improve, Ability to verbalize feelings will improve and Ability to disclose and discuss suicidal ideas  Physician  Treatment Plan for Secondary Diagnosis: Active Problems:   Severe recurrent major depression without psychotic features ([place])  Long Term Goal(s): Improvement in symptoms so as ready for discharge  Short Term Goals: Ability to demonstrate self-control will improve and Ability to identify and develop effective coping behaviors will improve  I certify that inpatient services furnished can reasonably be expected to improve the patient's condition.    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 She reports neuro-vegetative symptoms to include hypersomnia, decreased energy level, decreased appetite, some anhedonia. Denies psychotic symptoms. She reports that she had been doing well/ not depressed and functioning well in her daily activities up to recently when her mother passed away 06-07-20. States " everything seems to have gone down hill since then".She reports increased anxiety and depression since then.  She reports that she had been doing well on her psychiatric medication regimen and overall had been stable and relatively symptom free for two years preceding death of mother.  History of prior psychiatric admissions for depression. Most recent admission was 10/2017. At the time was admitted for  depression/SI. She has been treated with ECT in the past ( 2018)  History of MDD, history of prior suicide attempts, most recent 2002 by jumping from a height. Denies history of mania or hypomania. No history of psychosis.   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 Denies other medical illnesses, NKDA. Does not smoke  She does report past history of being on Synthroid several years ago, but thinks it was for depression. Was taking Baclofen PRN for spasticity  Home medications - Buspar 10 mgrs TID, Cymbalta 60 mgrs QDAY , which had recently been increased to 60 mgrs BID 2-3   days prior to admission, Seroquel 50 mgrs QHS, Trazodone 50 mgrs QHS . She was also prescribed Klonopin 0.5 mgrs BID PRN for anxiety but states was taking infrequently , once a week or so.   Family history  (+) for depression, OCD, in family. Grandmother completed suicide .  Plan- Inpatient admission.   Continue home medications which she reports have been effective and well tolerated . For now continue Cymbalta 60 mgrs QDAY , Seroquel 50 mgrs QHS, Buspar 10 mgrs TID, Trazodone 50 mgrs QHS PRN for insomnia. Baclofen PRN for spasms. Will check FT3, FT4 to follow up on elevated TSH.

## 2020-05-26 NOTE — Tx Team (Cosign Needed)
Interdisciplinary Treatment and Diagnostic Plan Update  05/26/2020 Time of Session: 9:00am Natasha [name] MRN: [id number]  Principal Diagnosis: <principal problem not specified>  Secondary Diagnoses: Active Problems:   Severe recurrent major depression without psychotic features ([place])   Current Medications:  Current Facility-Administered Medications  Medication Dose Route Frequency Provider Last Rate Last Admin  . acetaminophen (TYLENOL) tablet 650 mg  650 mg Oral Q6H PRN Lindon Romp A, NP      . alum & mag hydroxide-simeth (MAALOX/MYLANTA) 200-200-20 MG/5ML suspension 30 mL  30 mL Oral Q4H PRN Rozetta Nunnery, NP      . DULoxetine (CYMBALTA) DR capsule 60 mg  60 mg Oral BID Lindon Romp A, NP   60 mg at 05/26/20 0820  . magnesium hydroxide (MILK OF MAGNESIA) suspension 30 mL  30 mL Oral Daily PRN Lindon Romp A, NP      . QUEtiapine (SEROQUEL) tablet 50 mg  50 mg Oral QHS Lindon Romp A, NP   50 mg at 05/26/20 0201  . traZODone (DESYREL) tablet 50 mg  50 mg Oral QHS Lindon Romp A, NP   50 mg at 05/26/20 0201   PTA Medications: Medications Prior to Admission  Medication Sig Dispense Refill Last Dose  . baclofen (LIORESAL) 10 MG tablet Take 10 mg by mouth 3 (three) times daily.   Past Week at Unknown time  . busPIRone (BUSPAR) 10 MG tablet Take 1 tablet (10 mg total) by mouth 3 (three) times daily. 270 tablet 0 Past Week at Unknown time  . clonazePAM (KLONOPIN) 0.5 MG tablet Take 1 tablet (0.5 mg total) by mouth 2 (two) times daily as needed (anxiety). 60 tablet 0 Past Week at Unknown time  . DULoxetine (CYMBALTA) 60 MG capsule Take 1 capsule (60 mg total) by mouth daily. (Patient taking differently: Take 60 mg by mouth 2 (two) times daily. ) 90 capsule 0 Past Week at Unknown time  . Multiple Vitamin (MULTIVITAMIN ADULT) TABS Take 1 tablet by mouth daily.   Past Week at Unknown time  . QUEtiapine (SEROQUEL) 50 MG tablet Take 50 mg by mouth at bedtime.   Past Week at Unknown time   . traZODone (DESYREL) 50 MG tablet Take 50 mg by mouth at bedtime.    Past Week at Unknown time    Patient Stressors: Health problems Loss of mother on May 28,2021 from Parkinson's Disease  Patient Strengths: Ability for insight Communication skills Motivation for treatment/growth Supportive family/friends  Treatment Modalities: Medication Management, Group therapy, Case management,  1 to 1 session with clinician, Psychoeducation, Recreational therapy.   Physician Treatment Plan for Primary Diagnosis: <principal problem not specified> Long Term Goal(s):     Short Term Goals:    Medication Management: Evaluate patient's response, side effects, and tolerance of medication regimen.  Therapeutic Interventions: 1 to 1 sessions, Unit Group sessions and Medication administration.  Evaluation of Outcomes: Not Met  Physician Treatment Plan for Secondary Diagnosis: Active Problems:   Severe recurrent major depression without psychotic features ([place])  Long Term Goal(s):     Short Term Goals:       Medication Management: Evaluate patient's response, side effects, and tolerance of medication regimen.  Therapeutic Interventions: 1 to 1 sessions, Unit Group sessions and Medication administration.  Evaluation of Outcomes: Not Met   RN Treatment Plan for Primary Diagnosis: <principal problem not specified> Long Term Goal(s): Knowledge of disease and therapeutic regimen to maintain health will improve  Short Term Goals: Ability to verbalize feelings will  improve, Ability to disclose and discuss suicidal ideas and Ability to identify and develop effective coping behaviors will improve  Medication Management: RN will administer medications as ordered by provider, will assess and evaluate patient's response and provide education to patient for prescribed medication. RN will report any adverse and/or side effects to prescribing provider.  Therapeutic Interventions: 1 on 1 counseling  sessions, Psychoeducation, Medication administration, Evaluate responses to treatment, Monitor vital signs and CBGs as ordered, Perform/monitor CIWA, COWS, AIMS and Fall Risk screenings as ordered, Perform wound care treatments as ordered.  Evaluation of Outcomes: Not Met   LCSW Treatment Plan for Primary Diagnosis: <principal problem not specified> Long Term Goal(s): Safe transition to appropriate next level of care at discharge, Engage patient in therapeutic group addressing interpersonal concerns.  Short Term Goals: Engage patient in aftercare planning with referrals and resources, Increase social support, Increase emotional regulation, Identify triggers associated with mental health/substance abuse issues and Increase skills for wellness and recovery  Therapeutic Interventions: Assess for all discharge needs, 1 to 1 time with Social worker, Explore available resources and support systems, Assess for adequacy in community support network, Educate family and significant other(s) on suicide prevention, Complete Psychosocial Assessment, Interpersonal group therapy.  Evaluation of Outcomes: Not Met   Progress in Treatment: Attending groups: No. New to unit. Participating in groups: No. Taking medication as prescribed: Yes. Toleration medication: Yes. Family/Significant other contact made: No, will contact:  supports if consents are granted. Patient understands diagnosis: Yes. Discussing patient identified problems/goals with staff: Yes. Medical problems stabilized or resolved: No. Denies suicidal/homicidal ideation: No. Issues/concerns per patient self-inventory: Yes.  New problem(s) identified: Yes, Describe:  grief and loss  New Short Term/Long Term Goal(s): medication management for mood stabilization; elimination of SI thoughts; development of comprehensive mental wellness/sobriety plan.  Patient Goals:  "Establish safety and feel better."   Discharge Plan or Barriers: Patient  recently admitted to unit, CSW assessing for appropriate referrals. Patient reports she has received ECT treatment in the past but would not like to participate again. Asked about referrals for [place]  Reason for Continuation of Hospitalization: Anxiety Depression Medication stabilization Suicidal ideation  Estimated Length of Stay: 5-7 days  Attendees: Patient: Natasha [name] 05/26/2020 9:07 AM  Physician:  05/26/2020 9:07 AM  Nursing:  05/26/2020 9:07 AM  RN Care Manager: 05/26/2020 9:07 AM  Social Worker: Stephanie Acre, [place] 05/26/2020 9:07 AM  Recreational Therapist:  05/26/2020 9:07 AM  Other: Harriett Sine, NP 05/26/2020 9:07 AM  Other:  05/26/2020 9:07 AM  Other: 05/26/2020 9:07 AM    Scribe for Treatment Team: Joellen [place], [place] 05/26/2020 9:07 AM

## 2020-05-26 NOTE — Tx Team (Signed)
Initial Treatment Plan 05/26/2020 3:08 AM Natasha [name] NMM:[id number]    PATIENT STRESSORS: Health problems Loss of mother on May 28,2021 from Parkinson's Disease   PATIENT STRENGTHS: Ability for insight Communication skills Motivation for treatment/growth Supportive family/friends   PATIENT IDENTIFIED PROBLEMS: depression  Grief over recent loss of mother  "I can't get up and I can't eat."  safety               DISCHARGE CRITERIA:  Improved stabilization in mood, thinking, and/or behavior Medical problems require only outpatient monitoring Verbal commitment to aftercare and medication compliance  PRELIMINARY DISCHARGE PLAN: Attend PHP/IOP Outpatient therapy Return to previous living arrangement  PATIENT/FAMILY INVOLVEMENT: This treatment plan has been presented to and reviewed with the patient, Natasha [name].  The patient and family have been given the opportunity to ask questions and make suggestions.  Ephraim Hamburger, RN 05/26/2020, 3:08 AM

## 2020-05-26 NOTE — BHH Group Notes (Signed)
LCSW Group Therapy Note 05/26/2020 3:01 PM  Type of Therapy and Topic: Group Therapy: Overcoming Obstacles  Participation Level: Did Not Attend  Description of Group:  In this group patients will be encouraged to explore what they see as obstacles to their own wellness and recovery. They will be guided to discuss their thoughts, feelings, and behaviors related to these obstacles. The group will process together ways to cope with barriers, with attention given to specific choices patients can make. Each patient will be challenged to identify changes they are motivated to make in order to overcome their obstacles. This group will be process-oriented, with patients participating in exploration of their own experiences as well as giving and receiving support and challenge from other group members.  Therapeutic Goals: 1. Patient will identify personal and current obstacles as they relate to admission. 2. Patient will identify barriers that currently interfere with their wellness or overcoming obstacles.  3. Patient will identify feelings, thought process and behaviors related to these barriers. 4. Patient will identify two changes they are willing to make to overcome these obstacles:   Summary of Patient Progress  Invited, chose not to attend.     Therapeutic Modalities:  Cognitive Behavioral Therapy Solution Focused Therapy Motivational Interviewing Relapse Prevention Therapy   Alcario Drought Clinical Social Worker

## 2020-05-26 NOTE — Progress Notes (Signed)
   05/26/20 1312  Psych Admission Type (Psych Patients Only)  Admission Status Voluntary  Psychosocial Assessment  Patient Complaints Depression;Anxiety;Self-harm behaviors  Eye Contact Fair  Facial Expression Flat  Affect Depressed;Sad  Speech Logical/coherent  Interaction Cautious  Motor Activity Slow;Unsteady  Appearance/Hygiene Unremarkable  Behavior Characteristics Cooperative  Mood Depressed;Preoccupied  Thought Process  Coherency WDL  Content WDL  Delusions None reported or observed  Perception WDL  Hallucination None reported or observed  Judgment Impaired  Confusion None  Danger to Self  Current suicidal ideation? Plan  Description of Suicide Plan Plan to cut wrists  Self-Injurious Behavior No self-injurious ideation or behavior indicators observed or expressed   Agreement Not to Harm Self No  Danger to Others  Danger to Others None reported or observed

## 2020-05-26 NOTE — BHH Counselor (Signed)
Patient shared with CSW and NP that she participated in ECT following her last [facility] admission, but she hopes she will not need this treatment a second time. Patient inquired about TMS treatment and referrals. CSW encouraged patient to discuss this with provider.  CSW following.  Enid Cutter, MSW, LCSW-A Clinical Social Worker [facility] Adult Unit

## 2020-05-26 NOTE — Progress Notes (Signed)
Order obtained from NP, Nira Conn for pt to have her personal one arm crutch on the unit.

## 2020-05-26 NOTE — Progress Notes (Signed)
Pt brought from the OBS unit to 300 hall via wheelchair. Pt voluntarily presented to [facility] as a walk in accompanied by a friend. Presents with SI with a plan to cut her wrists. Pt reports worsening depression and anxiety since her mother passed away last month (2020-05-22). Her mother past away from Parkinson's Disease in her sleep and pt is grieving over her loss. Pt reports having no motivation for daily activities like getting up or eating. Reports poor appetite and not eating much today. Pt was offered gatorade and some snacks. Pt accepted the gatorade, but refused a meal or snacks. Pt educated about importance of nutrition and encouraged to eat breakfast in AM. Units rules and policies discussed with pt. Pt's hat secured in locker and listed on belongings sheet. Consents signed and opportunity to ask questions provided. High fall risk protocol discussed with pt since she's a minimal assist with a wheelchair. Deformity in feet and hands from previous suicide attempt in 2002 of jumping off a parking garage and breaking her neck. Pt is currently passively suicidal, but denies a plan. Pt verbally contracts for safety and denies HI and AVH. Active listening, reassurance, and support provided. Q 15 minute safety checks initiated.

## 2020-05-26 NOTE — Progress Notes (Signed)
Recreation Therapy Notes  Date: 6.14.21 Time: 0930 Location: 300 Hall Dayroom  Group Topic: Wellness  Goal Area(s) Addresses:  Patient will define components of whole wellness. Patient will verbalize benefit of whole wellness.  Intervention: Stress Management  Activity: Guided Imagery.  LRT read a script that took patients on a journey through a peaceful meadow to enjoy the sights and sounds of nature.  Education: Wellness, Building control surveyor.   Education Outcome: Acknowledges education/In group clarification offered/Needs additional education.   Clinical Observations/Feedback: Pt did not attend group activity.     [name], LRT/CTRS         [name] A 05/26/2020 11:39 AM

## 2020-05-26 NOTE — Progress Notes (Signed)
Patient ID: Natasha [name], female   DOB: Jun 20, 1971, 49 y.o.   MRN: [id number] Pt A&O x 4, with assist of wheelchair, presents with SI, plan to cut wrists and depression after the passing of her mother.  Past hx of SI attempts noted, pt attempted to jump off a parking garage and broke her neck in 2002.  Skin search completed.  Monitoring for safety. Pt calm & cooperative, withdrawn.

## 2020-05-26 NOTE — BHH Suicide Risk Assessment (Addendum)
[facility] Admission Suicide Risk Assessment   Nursing information obtained from:  Patient, Review of record Demographic factors:  Caucasian Current Mental Status:  Suicidal ideation indicated by patient, Self-harm behaviors Loss Factors:  Loss of significant relationship Historical Factors:  Prior suicide attempts Risk Reduction Factors:  Positive social support, Positive therapeutic relationship, Living with another person, especially a relative, Positive coping skills or problem solving skills  Total Time spent with patient: 45 minutes Principal Problem:  MDD Diagnosis:  Active Problems:   Severe recurrent major depression without psychotic features (HCC)  Subjective Data:   Continued Clinical Symptoms:  Alcohol Use Disorder Identification Test Final Score (AUDIT): 0 The "Alcohol Use Disorders Identification Test", Guidelines for Use in Primary Care, Second Edition.  World Science writer [employer]). Score between 0-7:  no or low risk or alcohol related problems. Score between 8-15:  moderate risk of alcohol related problems. Score between 16-19:  high risk of alcohol related problems. Score 20 or above:  warrants further diagnostic evaluation for alcohol dependence and treatment.   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 She reports neuro-vegetative symptoms to include hypersomnia, decreased energy level, decreased appetite, some anhedonia. Denies psychotic symptoms. She reports that she had been doing well/ not depressed and functioning well in her daily activities up to recently when her mother passed away 2020/06/03. States " everything seems to have gone down hill since then".She reports increased anxiety and depression since then.  She reports that she had been doing well on her psychiatric  medication regimen and overall had been stable and relatively symptom free for two years preceding death of mother.  History of prior psychiatric admissions for depression. Most recent admission was 10/2017. At the time was admitted for depression/SI. She has been treated with ECT in the past ( 2018)  History of MDD, history of prior suicide attempts, most recent 2002 by jumping from a height. Denies history of mania or hypomania. No history of psychosis.   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 Denies other medical illnesses, NKDA. Does not smoke  She does report past history of being on Synthroid several years ago, but thinks it was for depression. Was taking Baclofen PRN for spasticity  Home medications - Buspar 10 mgrs TID, Cymbalta 60 mgrs QDAY , which had recently been increased to 60 mgrs BID 2-3   days prior to admission, Seroquel 50 mgrs QHS, Trazodone 50 mgrs QHS . She was also prescribed Klonopin 0.5 mgrs BID PRN for anxiety but states was taking infrequently , once a week or so.   Family history  (+) for depression, OCD, in family. Grandmother completed suicide .  Plan- Inpatient admission.   Continue home medications which she reports have been effective and well tolerated . For now continue Cymbalta 60 mgrs QDAY , Seroquel 50 mgrs QHS, Buspar 10 mgrs TID, Trazodone 50 mgrs QHS PRN for insomnia. Baclofen PRN for spasms. Will check FT3, FT4 to follow up on elevated TSH.      Musculoskeletal: Strength & Muscle Tone: history of weakness , quadriparesis related to past trauma. Gait & Station: walks slowly with cane or uses wheelchair  Patient leans: N/A  Psychiatric Specialty Exam: Physical Exam  Review of Systems headache, no chest  pain, no shortness of breath, no diarrhea, ( +) nausea,  no vomiting, no rash   Blood pressure (!) 135/95, pulse 69, temperature 98.7 F (37.1  C), temperature source Oral, resp. rate 18, height 5' 0.5" (1.537 m), weight 68.5 kg, last menstrual period 05/12/2020, SpO2 100 %.Body mass index is 29 kg/m.  General Appearance: Casual  Eye Contact:  Good  Speech:  Normal Rate  Volume:  Normal  Mood:  Depressed  Affect:  constricted, slightly anxious   Thought Process:  Linear and Descriptions of Associations: Intact  Orientation:  Other:  fully alert and attentive  Thought Content:  no hallucinations, no delusions, not internally preoccupied   Suicidal Thoughts:  No today denies suicidal or self injurious ideations, denies homicidal or violent ideations, contracts for safety on unit   Homicidal Thoughts:  No  Memory:  recent and remote grossly intact   Judgement:  Fair/ improving  Insight:  Fair  Psychomotor Activity:  Normal- no restlessness or agitation  Concentration:  Concentration: Good and Attention Span: Good  Recall:  Good  Fund of Knowledge:  Good  Language:  Good  Akathisia:  Negative  Handed:  Right  AIMS (if indicated):     Assets:  Communication Skills Desire for Improvement Resilience  ADL's:  Intact  Cognition:  WNL  Sleep:  Number of Hours: 1      COGNITIVE FEATURES THAT CONTRIBUTE TO RISK:  Closed-mindedness, Loss of executive function and Polarized thinking    SUICIDE RISK:   Moderate:  Frequent suicidal ideation with limited intensity, and duration, some specificity in terms of plans, no associated intent, good self-control, limited dysphoria/symptomatology, some risk factors present, and identifiable protective factors, including available and accessible social support.  PLAN OF CARE: Patient will be admitted to inpatient psychiatric unit for stabilization and safety. Will provide and encourage milieu participation. Provide medication management and maked adjustments as needed.  Will follow daily.    I certify that inpatient services furnished can reasonably be expected to improve the patient's  condition.   [name], MD 05/26/2020, 2:33 PM

## 2020-05-27 NOTE — Progress Notes (Signed)
D:  Patient's self inventory sheet, patient sleeps good, no sleep medicaiton.   Poor appetite, low energy level, good concentration.  Rated depression and anxiety 5, hopeess 7.  Denied withdrawals.  Denied SI.  Physical problems, dizziness, headaches.  Physical pain, worst pain #5 in past 24 hours, back.  Goal is mobility, discharge plan.  Does have discharge plans. A:  Medications administered per MD orders.  Emotional support and encouragement given patient. R:  Denied SI and HI, contracts for safety.  Denied A/V hallucinations.  Safety maintained with 15 minute checks.

## 2020-05-27 NOTE — BHH Suicide Risk Assessment (Signed)
BHH INPATIENT:  Family/Significant Other Suicide Prevention Education  Suicide Prevention Education:   Patient Refusal for Family/Significant Other Suicide Prevention Education: The patient Natasha [name] has refused to provide written consent for family/significant other to be provided Family/Significant Other Suicide Prevention Education during admission and/or prior to discharge.  Physician notified.  SPE completed with patient, as patient refused to consent to family contact. SPI pamphlet provided to pt and pt was encouraged to share information with support network, ask questions, and talk about any concerns relating to SPE. Patient denies access to guns/firearms and verbalized understanding of information provided. Mobile Crisis information also provided to patient.    [name] 05/27/2020, 11:20 AM

## 2020-05-27 NOTE — Progress Notes (Signed)
   05/26/20 2045  Psych Admission Type (Psych Patients Only)  Admission Status Voluntary  Psychosocial Assessment  Patient Complaints None  Eye Contact Fair  Facial Expression Flat  Affect Depressed;Sad  Speech Logical/coherent  Interaction Cautious  Motor Activity Slow;Unsteady  Appearance/Hygiene Unremarkable  Behavior Characteristics Cooperative  Mood Pleasant  Thought Process  Coherency WDL  Content WDL  Delusions None reported or observed  Perception WDL  Hallucination None reported or observed  Judgment Impaired  Confusion None  Danger to Self  Current suicidal ideation? Denies  Self-Injurious Behavior No self-injurious ideation or behavior indicators observed or expressed   Agreement Not to Harm Self No  Danger to Others  Danger to Others None reported or observed

## 2020-05-27 NOTE — Progress Notes (Signed)
The patient shared in group this evening that she is no longer the same person in that she is feeling better. Her goal is to go home.

## 2020-05-27 NOTE — Plan of Care (Signed)
Nurse discussed anxiety, depression and coping skills with patient.  

## 2020-05-27 NOTE — Progress Notes (Signed)
Recreation Therapy Notes  Animal-Assisted Activity (AAA) Program Checklist/Progress Notes Patient Eligibility Criteria Checklist & Daily Group note for Rec Tx Intervention  Date: 6.15.21 Time: 1430 Location: [address]   AAA/T Program Assumption of Risk Form signed by Engineer, production or Parent Legal Guardian  YES  Patient is free of allergies or sever asthma  YES  Patient reports no fear of animals  YES  Patient reports no history of cruelty to animals YES  Patient understands his/her participation is voluntary YES  Patient washes hands before animal contact  YES  Patient washes hands after animal contact  YES  Behavioral Response: Engaged  Education: Charity fundraiser, Appropriate Animal Interaction   Education Outcome: Acknowledges understanding/In group clarification offered/Needs additional education.   Clinical Observations/Feedback: Pt attended and participated in activity.    [name], LRT/CTRS    [name] A 05/27/2020 3:29 PM

## 2020-05-27 NOTE — Evaluation (Signed)
Physical Therapy Evaluation Patient Details Name: Natasha [name] MRN: [id number] DOB: 07/29/1971 Today's Date: 05/27/2020   History of Present Illness  Presented to [facility] on 6/14 reporting worsening depression, self injurious ideations with thoughts of self cutting , suicidal ideations, H/OP C-6 injury/quadriparesis  Clinical Impression  Patient reports very interested in returning to [place] services to improve. Recommend OT prior to Dc for hand splint recommendations. Patient's gait is  Unsteady with 1 froearm, patient is to [facility] and/or scooter. Pt admitted with above diagnosis. Pt currently with functional limitations due to the deficits listed below (see PT Problem List). Pt will benefit from skilled PT to increase their independence and safety with mobility to allow discharge to the venue listed below.       Follow Up Recommendations  HHPT- has had PTA    Equipment Recommendations    none   Recommendations for Other Services   OT eval for hand splints    Precautions / Restrictions Precautions Precautions: Fall Precaution Comments: SI      Mobility  Bed Mobility Overal bed mobility: Modified Independent Bed Mobility: Rolling;Sidelying to Sit Rolling: Independent Sidelying to sit: Independent       General bed mobility comments: extra time, multiple attempts to sit up with momentum  Transfers Overall transfer level: Needs assistance Equipment used: Lofstrands (only right) Transfers: Sit to/from Stand Sit to Stand: Min guard         General transfer comment: close guard, no assist  Ambulation/Gait Ambulation/Gait assistance: Min guard Gait Distance (Feet): 80 Feet Assistive device: Lofstrands (right only) Gait Pattern/deviations: Step-through pattern;Scissoring;Ataxic Gait velocity: decr   General Gait Details: very slow, has to look at floor to maintain balance  Stairs            Wheelchair Mobility    Modified Rankin (Stroke Patients Only)        Balance Overall balance assessment: Needs assistance;History of Falls Sitting-balance support: No upper extremity supported Sitting balance-Leahy Scale: Fair     Standing balance support: Single extremity supported Standing balance-Leahy Scale: Poor                               Pertinent Vitals/Pain Pain Assessment: Faces Faces Pain Scale: Hurts even more Pain Location: hips back Pain Descriptors / Indicators: Tightness Pain Intervention(s): Monitored during session    Home Living Family/patient expects to be discharged to:: Private residence   Available Help at Discharge: Friend(s) Type of Home: Apartment Home Access: Level entry     Home Layout: One level Home Equipment: Wheelchair - Press photographer Additional Comments: forearm crutch    Prior Function Level of Independence: Independent with assistive device(s)         Comments: mobilizes with  forearm crutch when in community, uses scooter or WC in home, takes UBER     Hand Dominance        Extremity/Trunk Assessment   Upper Extremity Assessment Upper Extremity Assessment: Defer to OT evaluation    Lower Extremity Assessment Lower Extremity Assessment: LLE deficits/detail;RLE deficits/detail RLE Deficits / Details: increased tone-extension LLE Deficits / Details: less tone than right  gross flexion and extension    Cervical / Trunk Assessment Cervical / Trunk Assessment: Normal  Communication   Communication: No difficulties  Cognition Arousal/Alertness: Awake/alert Behavior During Therapy: WFL for tasks assessed/performed Overall Cognitive Status: Within Functional Limits for tasks assessed  General Comments      Exercises Other Exercises Other Exercises: ssisve stretching of bil hip  and low trunk x 4 each   Assessment/Plan    PT Assessment Patient needs continued PT services  PT Problem List Decreased  strength;Decreased balance;Decreased knowledge of precautions;Decreased mobility;Decreased range of motion;Impaired tone;Decreased activity tolerance       PT Treatment Interventions Gait training;Therapeutic exercise;Patient/family education;Functional mobility training    PT Goals (Current goals can be found in the Care Plan section)  Acute Rehab PT Goals Patient Stated Goal: to go home, continue HHPT PT Goal Formulation: With patient Time For Goal Achievement: 06/10/20 Potential to Achieve Goals: Good    Frequency Min 2X/week   Barriers to discharge        Co-evaluation               AM-PAC PT "6 Clicks" Mobility  Outcome Measure Help needed turning from your back to your side while in a flat bed without using bedrails?: None Help needed moving from lying on your back to sitting on the side of a flat bed without using bedrails?: None Help needed moving to and from a bed to a chair (including a wheelchair)?: A Little Help needed standing up from a chair using your arms (e.g., wheelchair or bedside chair)?: A Little Help needed to walk in hospital room?: A Lot Help needed climbing 3-5 steps with a railing? : A Lot 6 Click Score: 18    End of Session Equipment Utilized During Treatment: Gait belt Activity Tolerance: Patient tolerated treatment well Patient left: in bed;with call bell/phone within reach Nurse Communication: Mobility status PT Visit Diagnosis: Unsteadiness on feet (R26.81);Other symptoms and signs involving the nervous system (R29.898)    Time: 0177-9390 PT Time Calculation (min) (ACUTE ONLY): 34 min   Charges:   PT Evaluation $PT Eval Low Complexity: 1 Low PT Treatments $Therapeutic Activity: 8-22 mins        Blanchard Kelch PT Acute Rehabilitation Services Pager [phone] Office [phone]   Rada Hay 05/27/2020, 3:17 PM
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
## 2020-05-27 NOTE — Progress Notes (Signed)
[facility] MD Progress Note  05/27/2020 3:55 PM Natasha [name]  MRN:  [id number] Subjective:  Patient reports she is feeling better today. She emphasizes she had been doing well/ functioning independently before her mother died, after which she decompensated . Currently reports she is again feeling better. Denies suicidal ideations. Denies medication side effects at this time. Objective : I have discussed case with treatment team and have met with patient. 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 Reports was doing well until her mother passed away on 06/05/2020, after which she developed depression, neuro-vegetative symptoms , and suicidal ideations. History of prior psychiatric admissions for depression, most recently 2012.  Today she presents alert , attentive, calm, pleasant on approach. Reports feeling better than on admission and affect is noted fuller in range today ( does become briefly tearful when speaking about her mother).  She currently denies suicidal ideations. She has been visible on unit, mainly mobilizing in wheel chair. States she had been walking with adapted cane prior to admission and doing PT regularly, but stopped after her mother passed. Denies medication side effects- on Buspar, Cymbalta, Seroquel. States these medications have been well tolerated, denies side effects. No akathisia or abnormal movements noted or reported . She also takes Baclofen for spasticity. Behavior on unit is calm and in good control, pleasant on approach.  Admission TSH was elevated at 8.4 . FT4 1,04 .    Principal Problem: MDD by history  Diagnosis: Active Problems:   Severe recurrent major depression without psychotic features ([place])  Total Time spent with patient: 20 minutes  Past Psychiatric History:   Past Medical History:  Past Medical History:  Diagnosis  Date  . Anxiety   . C6 spinal cord injury ([place])   . Depression     Past Surgical History:  Procedure Laterality Date  . BACK SURGERY    . NECK SURGERY     Family History:  Family History  Problem Relation Age of Onset  . Alcohol abuse Father   . Alcohol abuse Brother   . Depression Mother   . Alcohol abuse Mother   . Parkinson's disease Mother    Family Psychiatric  History: Social History:  Social History   Substance and Sexual Activity  Alcohol Use Yes  . Alcohol/week: 0.0 standard drinks   Comment: Occasional use     Social History   Substance and Sexual Activity  Drug Use No    Social History   Socioeconomic History  . Marital status: Single    Spouse name: Not on file  . Number of children: Not on file  . Years of education: Not on file  . Highest education level: Not on file  Occupational History  . Not on file  Tobacco Use  . Smoking status: Former Research scientist (life sciences)  . Smokeless tobacco: Never Used  . Tobacco comment: No smoking hx; no need for cessation materials  Vaping Use  . Vaping Use: Never used  Substance and Sexual Activity  . Alcohol use: Yes    Alcohol/week: 0.0 standard drinks    Comment: Occasional use  . Drug use: No  . Sexual activity: Never  Other Topics Concern  . Not on file  Social History Narrative  . Not on file   Social Determinants of Health   Financial Resource Strain:   . Difficulty of Paying Living Expenses:   Food Insecurity:   .  Worried About Charity fundraiser in the Last Year:   . Arboriculturist in the Last Year:   Transportation Needs:   . Film/video editor (Medical):   [name] Kitchen Lack of Transportation (Non-Medical):   Physical Activity:   . Days of Exercise per Week:   . Minutes of Exercise per Session:   Stress:   . Feeling of Stress :   Social Connections:   . Frequency of Communication with Friends and Family:   . Frequency of Social Gatherings with Friends and Family:   . Attends Religious Services:   .  Active Member of Clubs or Organizations:   . Attends Archivist Meetings:   [name] Kitchen Marital Status:    Additional Social History:    Pain Medications: See MAR Prescriptions: See MAR Over the Counter: See MAR History of alcohol / drug use?: No history of alcohol / drug abuse  Sleep: Fair  Appetite:  Good  Current Medications: Current Facility-Administered Medications  Medication Dose Route Frequency Provider Last Rate Last Admin  . acetaminophen (TYLENOL) tablet 650 mg  650 mg Oral Q6H PRN Lindon Romp A, NP   650 mg at 05/27/20 1340  . alum & mag hydroxide-simeth (MAALOX/MYLANTA) 200-200-20 MG/5ML suspension 30 mL  30 mL Oral Q4H PRN Lindon Romp A, NP      . baclofen (LIORESAL) tablet 10 mg  10 mg Oral TID PRN Masami Plata, [name], MD   10 mg at 05/27/20 0855  . busPIRone (BUSPAR) tablet 10 mg  10 mg Oral TID Racer Quam, [name], MD   10 mg at 05/27/20 1300  . DULoxetine (CYMBALTA) DR capsule 60 mg  60 mg Oral Daily Xylon Croom, [name], MD   60 mg at 05/27/20 0823  . magnesium hydroxide (MILK OF MAGNESIA) suspension 30 mL  30 mL Oral Daily PRN Lindon Romp A, NP      . QUEtiapine (SEROQUEL) tablet 50 mg  50 mg Oral QHS Lindon Romp A, NP   50 mg at 05/26/20 2116  . traZODone (DESYREL) tablet 50 mg  50 mg Oral QHS PRN Johney Perotti, [name], MD   50 mg at 05/26/20 2116    Lab Results:  Results for orders placed or performed during the hospital encounter of 05/25/20 (from the past 48 hour(s))  SARS Coronavirus 2 by RT PCR (hospital order, performed in [facility] hospital lab) Nasopharyngeal Nasopharyngeal Swab     Status: None   Collection Time: 05/25/20  9:43 PM   Specimen: Nasopharyngeal Swab  Result Value Ref Range   SARS Coronavirus 2 NEGATIVE NEGATIVE    Comment: (NOTE) SARS-CoV-2 target nucleic acids are NOT DETECTED.  The SARS-CoV-2 RNA is generally detectable in upper and lower respiratory specimens during the acute phase of infection. The lowest concentration of SARS-CoV-2  viral copies this assay can detect is 250 copies / mL. A negative result does not preclude SARS-CoV-2 infection and should not be used as the sole basis for treatment or other patient management decisions.  A negative result may occur with improper specimen collection / handling, submission of specimen other than nasopharyngeal swab, presence of viral mutation(s) within the areas targeted by this assay, and inadequate number of viral copies (<250 copies / mL). A negative result must be combined with clinical observations, patient history, and epidemiological information.  Fact Sheet for Patients:   StrictlyIdeas.no  Fact Sheet for Healthcare Providers: BankingDealers.co.za  This test is not yet approved or  cleared by the [place] FDA  and has been authorized for detection and/or diagnosis of SARS-CoV-2 by FDA under an Emergency Use Authorization (EUA).  This EUA will remain in effect (meaning this test can be used) for the duration of the COVID-19 declaration under Section 564(b)(1) of the Act, 21 U.S.C. section 360bbb-3(b)(1), unless the authorization is terminated or revoked sooner.  Performed at [facility], [place] [address], [place], [place] [address]   CBC     Status: Abnormal   Collection Time: 05/26/20  6:38 AM  Result Value Ref Range   WBC 10.1 4.0 - 10.5 K/uL   RBC 5.32 (H) 3.87 - 5.11 MIL/uL   Hemoglobin 15.1 (H) 12.0 - 15.0 g/dL   HCT 46.3 (H) 36 - 46 %   MCV 87.0 80.0 - 100.0 fL   MCH 28.4 26.0 - 34.0 pg   MCHC 32.6 30.0 - 36.0 g/dL   RDW 13.9 11.5 - 15.5 %   Platelets 274 150 - 400 K/uL   nRBC 0.0 0.0 - 0.2 %    Comment: Performed at [facility], [place] [address] [place], [place] [address]  Comprehensive metabolic panel     Status: Abnormal   Collection Time: 05/26/20  6:38 AM  Result Value Ref Range   Sodium 135 135 - 145 mmol/L   Potassium 4.1 3.5 - 5.1 mmol/L    Chloride 101 98 - 111 mmol/L   CO2 21 (L) 22 - 32 mmol/L   Glucose, Bld 87 70 - 99 mg/dL    Comment: Glucose reference range applies only to samples taken after fasting for at least 8 hours.   BUN 10 6 - 20 mg/dL   Creatinine, Ser 0.67 0.44 - 1.00 mg/dL   Calcium 9.2 8.9 - 10.3 mg/dL   Total Protein 7.9 6.5 - 8.1 g/dL   Albumin 4.5 3.5 - 5.0 g/dL   AST 22 15 - 41 U/L   ALT 22 0 - 44 U/L   Alkaline Phosphatase 66 38 - 126 U/L   Total Bilirubin 1.0 0.3 - 1.2 mg/dL   GFR calc non Af Amer >60 >60 mL/min   GFR calc Af Amer >60 >60 mL/min   Anion gap 13 5 - 15    Comment: Performed at [facility], [place] [address] [place], [place] [address]  Hemoglobin A1c     Status: None   Collection Time: 05/26/20  6:38 AM  Result Value Ref Range   Hgb A1c MFr Bld 5.3 4.8 - 5.6 %    Comment: (NOTE) Pre diabetes:          5.7%-6.4%  Diabetes:              >6.4%  Glycemic control for   <7.0% adults with diabetes    Mean Plasma Glucose 105.41 mg/dL    Comment: Performed at [place] [address] [place], [place] [address]  Lipid panel     Status: Abnormal   Collection Time: 05/26/20  6:38 AM  Result Value Ref Range   Cholesterol 224 (H) 0 - 200 mg/dL   Triglycerides 105 <150 mg/dL   HDL 43 >40 mg/dL   Total CHOL/HDL Ratio 5.2 RATIO   VLDL 21 0 - 40 mg/dL   LDL Cholesterol 160 (H) 0 - 99 mg/dL    Comment:        Total Cholesterol/HDL:CHD Risk Coronary Heart Disease Risk Table  Men   Women  1/2 Average Risk   3.4   3.3  Average Risk       5.0   4.4  2 X Average Risk   9.6   7.1  3 X Average Risk  23.4   11.0        Use the calculated Patient Ratio above and the CHD Risk Table to determine the patient's CHD Risk.        ATP III CLASSIFICATION (LDL):  <100     mg/dL   Optimal  100-129  mg/dL   Near or Above                    Optimal  130-159  mg/dL   Borderline  160-189  mg/dL   High  >190     mg/dL   Very High Performed at [place] [address], [place], [place] [address]   TSH     Status: Abnormal   Collection Time: 05/26/20  6:38 AM  Result Value Ref Range   TSH 8.467 (H) 0.350 - 4.500 uIU/mL    Comment: Performed by a 3rd Generation assay with a functional sensitivity of <=0.01 uIU/mL. Performed at [facility], [place] [address], [place], [place] [address]   T4, free     Status: None   Collection Time: 05/26/20  6:31 PM  Result Value Ref Range   Free T4 1.04 0.61 - 1.12 ng/dL    Comment: (NOTE) Biotin ingestion may interfere with free T4 tests. If the results are inconsistent with the TSH level, previous test results, or the clinical presentation, then consider biotin interference. If needed, order repeat testing after stopping biotin. Performed at [facility], [place] [address], [place], [place] [address]     Blood Alcohol level:  Lab Results  Component Value Date   [facility] <10 10/18/2017   ETH <5 40/34/7425    Metabolic Disorder Labs: Lab Results  Component Value Date   HGBA1C 5.3 05/26/2020   MPG 105.41 05/26/2020   MPG 88.19 10/23/2017   Lab Results  Component Value Date   PROLACTIN 121.9 (H) 02/03/2017   Lab Results  Component Value Date   CHOL 224 (H) 05/26/2020   TRIG 105 05/26/2020   HDL 43 05/26/2020   CHOLHDL 5.2 05/26/2020   VLDL 21 05/26/2020   LDLCALC 160 (H) 05/26/2020   LDLCALC 124 (H) 10/23/2017    Physical Findings: AIMS: Facial and Oral Movements Muscles of Facial Expression: None, normal Lips and Perioral Area: None, normal Jaw: None, normal Tongue: None, normal,Extremity Movements Upper (arms, wrists, hands, fingers): None, normal Lower (legs, knees, ankles, toes): None, normal, Trunk Movements Neck, shoulders, hips: None, normal, Overall Severity Severity of abnormal movements (highest score from questions above): None, normal Incapacitation due to abnormal movements: None, normal Patient's awareness of abnormal  movements (rate only patient's report): No Awareness, Dental Status Current problems with teeth and/or dentures?: No Does patient usually wear dentures?: No  CIWA:  CIWA-Ar Total: 0 COWS:  COWS Total Score: 1  Musculoskeletal: Strength & Muscle Tone: history of quadriparesis Gait & Station: as per above- walks slowly with cane or uses wheel chair Patient leans: N/A  Psychiatric Specialty Exam: Physical Exam  Review of Systems denies headache, no chest pain, no shortness of breath, no vomiting  Blood pressure 117/79, pulse (!) 123, temperature 98.5 F (36.9 C), temperature source Oral, resp. rate 18, height 5' 0.5" (1.537 m), weight 68.5  kg, last menstrual period 05/12/2020, SpO2 98 %.Body mass index is 29 kg/m.  General Appearance: wel groomed   Eye Contact:  Good  Speech:  Normal Rate  Volume:  Normal  Mood:  improving , reports she feels better  Affect:  appropriate, reactive  Thought Process:  Linear and Descriptions of Associations: Intact  Orientation:  Full (Time, Place, and Person)  Thought Content:  no hallucinations, no delusions, not internally preoccupied   Suicidal Thoughts:  No denies suicidal or self injurious ideations, contracts for safety on unit at this time   Homicidal Thoughts:  No denies homicidal or violent ideations  Memory:  recent and remote grossly intact   Judgement:  Other:  improving   Insight:  present  Psychomotor Activity:  Decreased  Concentration:  Concentration: Good and Attention Span: Good  Recall:  Good  Fund of Knowledge:  Good  Language:  Good  Akathisia:  Negative  Handed:  Right  AIMS (if indicated):     Assets:  Communication Skills Desire for Improvement Resilience  ADL's:  Intact  Cognition:  WNL  Sleep:  Number of Hours: 1    Assessment -  49 year old female, was living alone/independently,self employed, 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 Reports was  doing well until her mother passed away on 06-08-2020, after which she developed depression, neuro-vegetative symptoms , and suicidal ideations. History of prior psychiatric admissions for depression, most recently 2012.  Today patient presents with improving mood and a more reactive affect, denies SI. Tolerating medications well . Sleep fair .    Treatment Plan Summary: Daily contact with patient to assess and evaluate symptoms and progress in treatment, Medication management, Plan as below and medications as below Encourage group and milieu participation Treatment team working on disposition planning options Continue Cymbalta 60 mgr QDAY for depression Continue Buspar 10 mgrs TID for anxiety Continue Seroquel 50 mgrs QHS for mood disorder, sleep Continue Trazodone 50 mgrs QHS PRN for insomnia  Continue Baclofen 10 mgrs TID PRN for spasms as needed  [name], MD 05/27/2020, 3:55 PM

## 2020-05-28 LAB — T3: T3, Total: 89 ng/dL (ref 71–180)

## 2020-05-28 MED ORDER — TRAZODONE HCL 50 MG PO TABS: 50.0000 mg | ORAL_TABLET | Freq: Every evening | ORAL | 0 refills | Status: DC | PRN

## 2020-05-28 MED ORDER — BUSPIRONE HCL 10 MG PO TABS: 10.0000 mg | ORAL_TABLET | Freq: Three times a day (TID) | ORAL | 1 refills | Status: DC

## 2020-05-28 MED ORDER — QUETIAPINE FUMARATE 50 MG PO TABS: 50.0000 mg | ORAL_TABLET | Freq: Every day | ORAL | 0 refills | Status: DC

## 2020-05-28 MED ORDER — DULOXETINE HCL 60 MG PO CPEP: 60.0000 mg | ORAL_CAPSULE | Freq: Every day | ORAL | 0 refills | Status: DC

## 2020-05-28 NOTE — Evaluation (Signed)
Occupational Therapy Evaluation Patient Details Name: Natasha [name] MRN: [id number] DOB: January 23, 1971 Today's Date: 05/28/2020    History of Present Illness Presented to [facility] on 6/14 reporting worsening depression, self injurious ideations with thoughts of self cutting , suicidal ideations, H/OP C-6 injury/quadriparesis   Clinical Impression   Natasha [name] is a 49 year old woman with a history of C6 injury with partial quadriparesis and currently placed in Behavorial Health secondary to suicidal ideation. OT evaluation ordered to educate and potentially place prefabricated splint to maintain ROM in bilateral hands in order for patient to maintain function at modified independence. Patient reports living alone and wanting splints to wear at night - potentially alternating wear schedule - in order to leave one hand free for bed mobility and functional tasks. Patient reports hands are clenched into fists with reports of discomfort and pain on awakening. On evaluation patient demonstrates greater weakness in left upper extremity with decreased functional use of left hand due to decreased ROM and strength, and spasticity. Patient's 3-5th digit maintain flexed position at MCPs, DIPs and PIPs requiring PROM to achieve extension. Therapist recommended adjustable resting hand splint that allows for wrist flexion positioning to reduce tenodesis effect, and strapping across PIPs to maintain extension position. Examples shown. Patient and therapist discussed the possible use of antispasticity splints and the potential need for custom splint fabrication at outpatient clinic if prefabrication splint not effective. Patient verbalized understanding and thankful for recommendations. No further needs at this time.    Follow Up Recommendations  Other (comment) (Recommended OP OT for custom splints if prefab splints do not work.)    Teacher, adult education for Other Services        Precautions / Restrictions Precautions Precautions: Fall Precaution Comments: SI      Mobility Bed Mobility Overal bed mobility: Modified Independent                Transfers Overall transfer level: Modified independent Equipment used: Lofstrands                  Balance                                           ADL either performed or assessed with clinical judgement   ADL Overall ADL's : Modified independent                                       General ADL Comments: Patient reports modified independence with ADLs. Uses pincer grasp bilateral handling task.     Vision   Vision Assessment?: No apparent visual deficits     Perception     Praxis      Pertinent Vitals/Pain Pain Assessment: No/denies pain     Hand Dominance     Extremity/Trunk Assessment Upper Extremity Assessment Upper Extremity Assessment: RUE deficits/detail;LUE deficits/detail RUE Deficits / Details: 4+/5 shoulder strength, 4+/5 bicep strength, 3+/5 tricep strength, 4/5 wrist strength at neutral, gross functional ROM/strength of right hand, RUE Sensation: decreased light touch LUE Deficits / Details: 4-/5 left shoulder, 4-/5 bicep, 3+/5 tricep, decreased wrist extension, 3-5th digits can be manually extended but maintain flexion of MCP, DIP & PIP LUE Sensation: decreased light touch   Lower Extremity Assessment Lower Extremity Assessment: Defer  to PT evaluation       Communication     Cognition Arousal/Alertness: Awake/alert Behavior During Therapy: WFL for tasks assessed/performed Overall Cognitive Status: Within Functional Limits for tasks assessed                                     General Comments       Exercises     Shoulder Instructions      Home Living                                          Prior Functioning/Environment                   OT Problem List: Decreased  strength;Decreased range of motion;Impaired UE functional use;Pain      OT Treatment/Interventions:      OT Goals(Current goals can be found in the care plan section) Acute Rehab OT Goals Patient Stated Goal: to maintain ROM in hands. OT Goal Formulation: All assessment and education complete, DC therapy Time For Goal Achievement: 05/28/20 Potential to Achieve Goals: Good  OT Frequency:     Barriers to D/C:            Co-evaluation              AM-PAC OT "6 Clicks" Daily Activity     Outcome Measure Help from another person eating meals?: None Help from another person taking care of personal grooming?: None Help from another person toileting, which includes using toliet, bedpan, or urinal?: None Help from another person bathing (including washing, rinsing, drying)?: None Help from another person to put on and taking off regular upper body clothing?: None Help from another person to put on and taking off regular lower body clothing?: None 6 Click Score: 24   End of Session Nurse Communication:  (Informed RN that assessment complete.)  Activity Tolerance: Patient tolerated treatment well Patient left: in bed;with call bell/phone within reach  OT Visit Diagnosis: Muscle weakness (generalized) (M62.81);Pain Pain - Right/Left: Left Pain - part of body: Hand                Time: 6734-1937 OT Time Calculation (min): 27 min Charges:  OT General Charges $OT Visit: 1 Visit OT Evaluation $OT Eval Moderate Complexity: 1 Mod  Braun Rocca, OTR/L [place]  Office [phone] Pager: [phone]   Lenward Chancellor 05/28/2020, 12:29 PM

## 2020-05-28 NOTE — Progress Notes (Signed)
  [facility] Adult Case Management Discharge Plan :  Will you be returning to the same living situation after discharge:  Yes,  patient is returning home to her apartment At discharge, do you have transportation home?: Yes,  patient's friend is picking her up Do you have the ability to pay for your medications: Yes,  Medicare  Release of information consent forms completed and in the chart;  Patient's signature needed at discharge.  Patient to Follow up at:  Follow-up Information    [facility]. Go on 06/06/2020.   Specialty: Behavioral Health Why: You have an appointment on 06/06/20 at 10:00 am with Anu for therapy.  This will be a Virtual appointment.  At this time, the provider will schedule an appointment for medication managemetn services. Be sure to provide any discharge paperwork.  Contact information: [address] [place] [address] [phone]               Next level of care provider has access to [facility] [place] Link:yes  Safety Planning and Suicide Prevention discussed: Yes,  with the patient     Has patient been referred to the Quitline?: N/A patient is not a smoker  Patient has been referred for addiction treatment: N/A  [name], LCSWA 05/28/2020, 9:45 AM

## 2020-05-28 NOTE — BHH Suicide Risk Assessment (Signed)
[facility] Discharge Suicide Risk Assessment   Principal Problem: <principal problem not specified> Discharge Diagnoses: Active Problems:   Severe recurrent major depression without psychotic features (H[facility])   Total Time spent with patient: 30 minutes  Musculoskeletal: Strength & Muscle Tone: decreased Gait & Station: broad based Patient leans: N/A  Psychiatric Specialty Exam: Review of Systems  Musculoskeletal: Positive for arthralgias and myalgias.  All other systems reviewed and are negative.   Blood pressure (!) 82/57, pulse (!) 102, temperature 98.7 F (37.1 C), temperature source Oral, resp. rate 18, height 5' 0.5" (1.537 m), weight 68.5 kg, last menstrual period 05/12/2020, SpO2 98 %.Body mass index is 29 kg/m.  General Appearance: Casual  Eye Contact::  Good  Speech:  Normal Rate409  Volume:  Normal  Mood:  Anxious  Affect:  Congruent  Thought Process:  Coherent and Descriptions of Associations: Intact  Orientation:  Full (Time, Place, and Person)  Thought Content:  Logical  Suicidal Thoughts:  No  Homicidal Thoughts:  No  Memory:  Immediate;   Good Recent;   Good Remote;   Good  Judgement:  Intact  Insight:  Fair  Psychomotor Activity:  Normal  Concentration:  Fair  Recall:  Fair  Fund of Knowledge:Good  Language: Good  Akathisia:  Negative  Handed:  Right  AIMS (if indicated):     Assets:  Desire for Improvement Resilience  Sleep:  Number of Hours: 6.75  Cognition: WNL  ADL's:  Intact   Mental Status Per Nursing Assessment::   On Admission:  Suicidal ideation indicated by patient, Self-harm behaviors  Demographic Factors:  Caucasian  Loss Factors: Loss of significant relationship  Historical Factors: Impulsivity  Risk Reduction Factors:   Living with another person, especially a relative, Positive therapeutic relationship and Positive coping skills or problem solving skills  Continued Clinical Symptoms:  Depression:   Impulsivity  Cognitive  Features That Contribute To Risk:  None    Suicide Risk:  Minimal: No identifiable suicidal ideation.  Patients presenting with no risk factors but with morbid ruminations; may be classified as minimal risk based on the severity of the depressive symptoms   Follow-up Information    [facility] Follow up on 06/06/2020.   Specialty: Behavioral Health Why: You have an appointment on 06/06/20 at 10:00 am with Anu for therapy.  This will be a Virtual appointment.  At this time, the provider will schedule an appointment for medication managemetn services. Contact information: [address] [place] [address] [phone]               Plan Of Care/Follow-up recommendations:  Activity:  ad lib  Antonieta Pert, MD 05/28/2020, 9:11 AM

## 2020-05-28 NOTE — Progress Notes (Signed)
   05/28/20 1000  Psych Admission Type (Psych Patients Only)  Admission Status Voluntary  Psychosocial Assessment  Patient Complaints Anxiety  Eye Contact Fair  Facial Expression Anxious  Affect Appropriate to circumstance  Speech Logical/coherent  Interaction Assertive  Motor Activity Slow;Unsteady  Appearance/Hygiene Unremarkable  Behavior Characteristics Cooperative  Mood Pleasant  Thought Process  Coherency WDL  Content WDL  Delusions None reported or observed  Perception WDL  Hallucination None reported or observed  Judgment Impaired  Confusion None  Danger to Self  Current suicidal ideation? Denies  Self-Injurious Behavior No self-injurious ideation or behavior indicators observed or expressed   Agreement Not to Harm Self No  Danger to Others  Danger to Others None reported or observed

## 2020-05-28 NOTE — Progress Notes (Signed)
Pt has been visibile in th milieu, interacting well with peers. Pt took a shower before, too her evening meds and went to bed. Pt has been resting well, no issues, will continue to monitor.

## 2020-05-28 NOTE — Progress Notes (Signed)
Pt discharged to lobby with pt's friend present to give pt ride home. Pt was stable and appreciative at that time. All papers and prescriptions were given and valuables returned. Verbal understanding expressed. Denies SI/HI and A/VH. Pt given opportunity to express concerns and ask questions.

## 2020-05-28 NOTE — Discharge Summary (Signed)
Physician Discharge Summary Note  Patient:  Natasha [name] is an 49 y.o., female MRN:  [id number] DOB:  08-Mar-1971 Patient phone:  [phone] (home)  Patient address:   [address] [place] [address],   Total Time spent with patient: Greater than 30 minutes  Date of Admission:  05/25/2020  Date of Discharge: 02-10-20  Reason for Admission: Worsening depression & self injurious ideations.  Principal Problem: Severe recurrent major depression without psychotic features Natasha [name])  Discharge Diagnoses: Patient Active Problem List   Diagnosis Date Noted  . Severe recurrent major depression without psychotic features ([name]) [F33.2] 05/26/2020    Priority: High  . Paraplegia ([name]) [G82.20] 10/21/2017  . GAD (generalized anxiety disorder) [F41.1] 10/20/2017  . Major depressive disorder, recurrent, severe without psychotic features ([name]) [F33.2]    Past Psychiatric History: Patient has a long history of recurrent episodes of depression with past serious suicide attempts.  Had been responding well to maintenance ECT.  Recent downturn in mood at least partially related to her mother have to move into assisted living which cause the patient to become lonely at home.  Past Medical History:  Past Medical History:  Diagnosis Date  . Anxiety   . C6 spinal cord injury ([name])   . Depression     Past Surgical History:  Procedure Laterality Date  . BACK SURGERY    . NECK SURGERY     Family History:  Family History  Problem Relation Age of Onset  . Alcohol abuse Father   . Alcohol abuse Brother   . Depression Mother   . Alcohol abuse Mother   . Parkinson's disease Mother    Family Psychiatric  History: See H&P  Social History:  Social History   Substance and Sexual Activity  Alcohol Use Yes  . Alcohol/week: 0.0 standard drinks   Comment: Occasional use     Social History   Substance and Sexual Activity  Drug Use No    Social History   Socioeconomic History   . Marital status: Single    Spouse name: Not on file  . Number of children: Not on file  . Years of education: Not on file  . Highest education level: Not on file  Occupational History  . Not on file  Tobacco Use  . Smoking status: Former Research scientist (life sciences)  . Smokeless tobacco: Never Used  . Tobacco comment: No smoking hx; no need for cessation materials  Vaping Use  . Vaping Use: Never used  Substance and Sexual Activity  . Alcohol use: Yes    Alcohol/week: 0.0 standard drinks    Comment: Occasional use  . Drug use: No  . Sexual activity: Never  Other Topics Concern  . Not on file  Social History Narrative  . Not on file   Social Determinants of Health   Financial Resource Strain:   . Difficulty of Paying Living Expenses:   Food Insecurity:   . Worried About Charity fundraiser in the Last Year:   . Arboriculturist in the Last Year:   Transportation Needs:   . Film/video editor (Medical):   [name] Kitchen Lack of Transportation (Non-Medical):   Physical Activity:   . Days of Exercise per Week:   . Minutes of Exercise per Session:   Stress:   . Feeling of Stress :   Social Connections:   . Frequency of Communication with Friends and Family:   . Frequency of Social Gatherings with Friends and Family:   . Attends Religious  Services:   . Active Member of Clubs or Organizations:   . Attends Banker Meetings:   [name] Kitchen Marital Status:    Hospital Course: (Per Md's admission evaluation notes): 66 year Natasha single female , living independently, self employed (as Animal nutritionist). Presented to Natasha [name] on 05/26/20 reporting worsening depression, self injurious ideations with thoughts of self cutting, suicidal ideations. She reports neuro-vegetative symptoms to include hypersomnia, decreased energy level, decreased appetite, some anhedonia. Denies psychotic symptoms. She reports that she had been doing well/ not depressed and functioning well in her daily activities up to recently when her mother  passed away 06-04-20. States " everything seems to have gone down hill since then".She reports increased anxiety and depression since then. She reports that she had been doing well on her psychiatric medication regimen and overall had been stable and relatively symptom free for two years preceding death of mother. Medical History- History of suicide attempt by jumping off a parking garage in 2002- sustained cervical spine damage, leading to quadriparesis. Denies other medical illnesses, NKDA. Does not smoke. She does report past history of being on Synthroid several years ago, but thinks it was for depression. Home medications - Buspar 10 mgrs TID, Cymbalta 60 mgrs QDAY , which had recently been increased to 60 mgrs BID a few days prior to admission, Seroquel 50 mgrs QHS, Trazodone 50 mgrs QHS.   After the above admission evaluation, Natasha [name]'s presenting symptoms were noted. She was recommended for mood stabilization treatments. The medication regimen targeting those presenting symptoms were discussed with her & initiated with her consent. She was medicated, stabilized & discharged on the medications as listed on her discharge medication lists below. Besides the mood stabilization treatments, Natasha [name] was also enrolled & participated in the group counseling sessions being offered & held on this unit. She learned coping skills. She also presented other significant pre-existing medical issues that required treatment. She was resumed & discharged on all her pertinent home medications for those health issues. She tolerated her treatment regimen without any adverse effects or reactions reported.  During the course of her hospitalization, the 15-minute checks were adequate to ensure Natasha [name]'s safety.  Patient did not display any dangerous violent or suicidal behavior on the unit. She interacted with patients & staff appropriately, participated appropriately in the group sessions/therapies. Her medications were  addressed & adjusted to meet her needs. She was recommended for outpatient follow-up care & medication management upon discharge to assure continuity of care.  At the time of discharge patient is not reporting any acute suicidal/homicidal ideations. She feels more confident about her self-care & in managing the suicidal/homicidal thoughts. She currently denies any new issues or concerns. Education and supportive counseling provided throughout her hospital stay & upon discharge.  Today upon her discharge evaluation with the attending psychiatrist, Walta shares she is doing well. She denies any other specific concerns. She is sleeping well. Her appetite is good. She denies other physical complaints. She denies AH/VH. She feels that her medications have been helpful & is in agreement to continue her current treatment regimen. She was able to engage in safety planning including plan to return to [facility] or contact emergency services if she feels unable to maintain her own safety or the safety of others. Pt had no further questions, comments, or concerns. She left [facility] with all personal belongings in no apparent distress. Transportation per her friend.  Physical Findings: AIMS: Facial and Oral Movements Muscles of Facial Expression: None, normal Lips and Perioral Area:  None, normal Jaw: None, normal Tongue: None, normal,Extremity Movements Upper (arms, wrists, hands, fingers): None, normal Lower (legs, knees, ankles, toes): None, normal, Trunk Movements Neck, shoulders, hips: None, normal, Overall Severity Severity of abnormal movements (highest score from questions above): None, normal Incapacitation due to abnormal movements: None, normal Patient's awareness of abnormal movements (rate only patient's report): No Awareness, Dental Status Current problems with teeth and/or dentures?: No Does patient usually wear dentures?: No  CIWA:  CIWA-Ar Total: 0 COWS:  COWS Total Score:  1  Musculoskeletal: Strength & Muscle Tone: decreased Gait & Station: unsteady Patient leans: N/A  Psychiatric Specialty Exam: Physical Exam  Nursing note and vitals reviewed. Constitutional: She appears well-developed.  HENT:  Head: Normocephalic and atraumatic.  Eyes: Pupils are equal, round, and reactive to light. Conjunctivae are normal.  Cardiovascular: Regular rhythm and normal heart sounds.  Respiratory: Effort normal. No respiratory distress.  GI: Soft.  Musculoskeletal:        General: Normal range of motion.     Cervical back: Normal range of motion.  Neurological: She is alert.  Patient is paraplegic with chronic neurologic disability as noted elsewhere.  No change.  Skin: Skin is warm and dry.  Psychiatric: Her speech is normal and behavior is normal. Judgment and thought content normal.    Review of Systems  Constitutional: Negative.   HENT: Negative.   Eyes: Negative.   Respiratory: Negative.   Cardiovascular: Negative.   Gastrointestinal: Negative.   Genitourinary: Negative for dysuria.  Musculoskeletal: Negative.  Negative for myalgias.  Skin: Negative.   Neurological: Positive for focal weakness. Negative for dizziness, tremors, seizures, weakness and headaches.  Endo/Heme/Allergies: Negative for environmental allergies.       Allergies: NKDA  Psychiatric/Behavioral: Positive for depression (Stabilized with medication prior to discharge). Negative for memory loss, substance abuse and suicidal ideas. Hallucinations: Stable. The patient has insomnia (Stabilized iwth medication prior to discharge). The patient is not nervous/anxious.     Blood pressure (!) 82/57, pulse (!) 102, temperature 98.7 F (37.1 C), temperature source Oral, resp. rate 18, height 5' 0.5" (1.537 m), weight 68.5 kg, last menstrual period 05/12/2020, SpO2 98 %.Body mass index is 29 kg/m.  See Md's discharge SRA  Sleep:  Number of Hours: 6.75   Has this patient used any form of tobacco  in the last 30 days? (Cigarettes, Smokeless Tobacco, Cigars, and/or Pipes): No  Blood Alcohol level:  Lab Results  Component Value Date   ETH <10 10/18/2017   ETH <5 08/15/2017   Metabolic Disorder Labs:  Lab Results  Component Value Date   HGBA1C 5.3 05/26/2020   MPG 105.41 05/26/2020   MPG 88.19 10/23/2017   Lab Results  Component Value Date   PROLACTIN 121.9 (H) 02/03/2017   Lab Results  Component Value Date   CHOL 224 (H) 05/26/2020   TRIG 105 05/26/2020   HDL 43 05/26/2020   CHOLHDL 5.2 05/26/2020   VLDL 21 05/26/2020   LDLCALC 160 (H) 05/26/2020   LDLCALC 124 (H) 10/23/2017   See Psychiatric Specialty Exam and Suicide Risk Assessment completed by Attending Physician prior to discharge.  Discharge destination:  Home  Is patient on multiple antipsychotic therapies at discharge:  No   Has Patient had three or more failed trials of antipsychotic monotherapy by history:  No  Recommended Plan for Multiple Antipsychotic Therapies: NA  Allergies as of 05/28/2020   No Known Allergies     Medication List    STOP taking these medications  clonazePAM 0.5 MG tablet Commonly known as: KLONOPIN   Multivitamin Adult Tabs     TAKE these medications     Indication  baclofen 10 MG tablet Commonly known as: LIORESAL Take 10 mg by mouth 3 (three) times daily.  Indication: Muscle Spasm   busPIRone 10 MG tablet Commonly known as: BUSPAR Take 1 tablet (10 mg total) by mouth 3 (three) times daily. For anxiety What changed: additional instructions  Indication: Anxiety Disorder, Major Depressive Disorder   DULoxetine 60 MG capsule Commonly known as: CYMBALTA Take 1 capsule (60 mg total) by mouth daily. For depression Start taking on: May 29, 2020 What changed: additional instructions  Indication: Major Depressive Disorder   QUEtiapine 50 MG tablet Commonly known as: SEROQUEL Take 1 tablet (50 mg total) by mouth at bedtime. For mood control What changed:  additional instructions  Indication: Mood control   traZODone 50 MG tablet Commonly known as: DESYREL Take 1 tablet (50 mg total) by mouth at bedtime as needed for sleep. What changed:   when to take this  reasons to take this  Indication: Trouble Sleeping       Follow-up Information    [facility]. Go on 06/06/2020.   Specialty: Behavioral Health Why: You have an appointment on 06/06/20 at 10:00 am with Anu for therapy.  This will be a Virtual appointment.  At this time, the provider will schedule an appointment for medication managemetn services. Be sure to provide any discharge paperwork.  Contact information: [address] [place] [address] [phone]              Follow-up recommendations: Activity:  As tolerated Diet: As recommended by your primary care doctor. Keep all scheduled follow-up appointments as recommended.  Comments: Prescriptions given at discharge.  Patient agreeable to plan.  Given opportunity to ask questions.  Appears to feel comfortable with discharge denies any current suicidal or homicidal thought. Patient is also instructed prior to discharge to: Take all medications as prescribed by his/her mental healthcare provider. Report any adverse effects and or reactions from the medicines to his/her outpatient provider promptly. Patient has been instructed & cautioned: To not engage in alcohol and or illegal drug use while on prescription medicines. In the event of worsening symptoms, patient is instructed to call the crisis hotline, 911 and or go to the nearest ED for appropriate evaluation and treatment of symptoms. To follow-up with his/her primary care provider for your other medical issues, concerns and or health care needs.  Signed: Armandina Stammer, NP, PMHNP, FNP-BC 05/28/2020, 10:02 AM

## 2020-08-08 ENCOUNTER — Other Ambulatory Visit: Payer: Self-pay | Admitting: Physician Assistant

## 2020-08-08 DIAGNOSIS — Z1231 Encounter for screening mammogram for malignant neoplasm of breast: Secondary | ICD-10-CM

## 2020-09-15 ENCOUNTER — Ambulatory Visit: Payer: Medicare Other

## 2020-10-13 DIAGNOSIS — M21371 Foot drop, right foot: Secondary | ICD-10-CM | POA: Diagnosis present

## 2020-10-15 ENCOUNTER — Encounter (HOSPITAL_COMMUNITY): Payer: Self-pay | Admitting: Psychiatry

## 2020-10-15 ENCOUNTER — Other Ambulatory Visit: Payer: Self-pay

## 2020-10-15 ENCOUNTER — Inpatient Hospital Stay (HOSPITAL_COMMUNITY)
Admission: RE | Admit: 2020-10-15 | Discharge: 2020-10-16 | DRG: 885 | Disposition: A | Discharge status: Other Acute Inpatient Hospital | Payer: Medicare Other | Attending: Psychiatry | Admitting: Psychiatry

## 2020-10-15 DIAGNOSIS — F411 Generalized anxiety disorder: Secondary | ICD-10-CM | POA: Diagnosis present

## 2020-10-15 DIAGNOSIS — G8250 Quadriplegia, unspecified: Secondary | ICD-10-CM | POA: Diagnosis present

## 2020-10-15 DIAGNOSIS — F17210 Nicotine dependence, cigarettes, uncomplicated: Secondary | ICD-10-CM | POA: Diagnosis present

## 2020-10-15 DIAGNOSIS — Z20822 Contact with and (suspected) exposure to covid-19: Secondary | ICD-10-CM | POA: Diagnosis present

## 2020-10-15 DIAGNOSIS — Z86718 Personal history of other venous thrombosis and embolism: Secondary | ICD-10-CM

## 2020-10-15 DIAGNOSIS — Z23 Encounter for immunization: Secondary | ICD-10-CM | POA: Diagnosis present

## 2020-10-15 DIAGNOSIS — R45851 Suicidal ideations: Secondary | ICD-10-CM | POA: Diagnosis present

## 2020-10-15 DIAGNOSIS — M21371 Foot drop, right foot: Secondary | ICD-10-CM | POA: Diagnosis present

## 2020-10-15 DIAGNOSIS — G8220 Paraplegia, unspecified: Secondary | ICD-10-CM | POA: Diagnosis present

## 2020-10-15 DIAGNOSIS — M62838 Other muscle spasm: Secondary | ICD-10-CM | POA: Diagnosis present

## 2020-10-15 DIAGNOSIS — F419 Anxiety disorder, unspecified: Secondary | ICD-10-CM | POA: Diagnosis present

## 2020-10-15 DIAGNOSIS — Z9151 Personal history of suicidal behavior: Secondary | ICD-10-CM

## 2020-10-15 DIAGNOSIS — Z818 Family history of other mental and behavioral disorders: Secondary | ICD-10-CM | POA: Diagnosis not present

## 2020-10-15 DIAGNOSIS — F332 Major depressive disorder, recurrent severe without psychotic features: Secondary | ICD-10-CM | POA: Diagnosis present

## 2020-10-15 DIAGNOSIS — S14109A Unspecified injury at unspecified level of cervical spinal cord, initial encounter: Secondary | ICD-10-CM | POA: Diagnosis present

## 2020-10-15 DIAGNOSIS — Z811 Family history of alcohol abuse and dependence: Secondary | ICD-10-CM

## 2020-10-15 LAB — RESPIRATORY PANEL BY RT PCR (FLU A&B, COVID)
Influenza A by PCR: NEGATIVE
Influenza B by PCR: NEGATIVE
SARS Coronavirus 2 by RT PCR: NEGATIVE

## 2020-10-15 MED ORDER — LORAZEPAM 1 MG PO TABS: 1.0000 mg | ORAL_TABLET | Freq: Four times a day (QID) | ORAL | Status: DC | PRN

## 2020-10-15 MED ORDER — ACETAMINOPHEN 325 MG PO TABS: 650.0000 mg | ORAL_TABLET | Freq: Four times a day (QID) | ORAL | Status: DC | PRN

## 2020-10-15 MED ORDER — MAGNESIUM HYDROXIDE 400 MG/5ML PO SUSP: 30.0000 mL | Freq: Every day | ORAL | Status: DC | PRN

## 2020-10-15 MED ORDER — THIAMINE HCL 100 MG PO TABS
100.0000 mg | ORAL_TABLET | Freq: Every day | ORAL | Status: DC
  Administered 2020-10-16: 100 mg via ORAL
  Filled 2020-10-15 (×3): qty 1

## 2020-10-15 MED ORDER — TRAZODONE HCL 50 MG PO TABS
50.0000 mg | ORAL_TABLET | Freq: Every evening | ORAL | Status: DC | PRN
  Administered 2020-10-15: 50 mg via ORAL
  Filled 2020-10-15: qty 1

## 2020-10-15 MED ORDER — ONDANSETRON 4 MG PO TBDP: 4.0000 mg | ORAL_TABLET | Freq: Four times a day (QID) | ORAL | Status: DC | PRN

## 2020-10-15 MED ORDER — NICOTINE 21 MG/24HR TD PT24
21.0000 mg | MEDICATED_PATCH | Freq: Every day | TRANSDERMAL | Status: DC
  Filled 2020-10-15 (×3): qty 1

## 2020-10-15 MED ORDER — HYDROXYZINE HCL 25 MG PO TABS: 25.0000 mg | ORAL_TABLET | Freq: Four times a day (QID) | ORAL | Status: DC | PRN

## 2020-10-15 MED ORDER — ALUM & MAG HYDROXIDE-SIMETH 200-200-20 MG/5ML PO SUSP: 30.0000 mL | ORAL | Status: DC | PRN

## 2020-10-15 MED ORDER — BREXPIPRAZOLE 0.25 MG PO TABS
0.5000 mg | ORAL_TABLET | Freq: Every day | ORAL | Status: DC
  Administered 2020-10-16: 0.5 mg via ORAL
  Filled 2020-10-15 (×2): qty 2

## 2020-10-15 MED ORDER — BUSPIRONE HCL 10 MG PO TABS
10.0000 mg | ORAL_TABLET | Freq: Three times a day (TID) | ORAL | Status: DC
  Administered 2020-10-16 (×2): 10 mg via ORAL
  Filled 2020-10-15 (×8): qty 1

## 2020-10-15 MED ORDER — LOPERAMIDE HCL 2 MG PO CAPS: 2.0000 mg | ORAL_CAPSULE | ORAL | Status: DC | PRN

## 2020-10-15 MED ORDER — INFLUENZA VAC SPLIT QUAD 0.5 ML IM SUSY
0.5000 mL | PREFILLED_SYRINGE | INTRAMUSCULAR | Status: AC
  Administered 2020-10-16: 0.5 mL via INTRAMUSCULAR
  Filled 2020-10-15: qty 0.5

## 2020-10-15 MED ORDER — ADULT MULTIVITAMIN W/MINERALS CH
1.0000 | ORAL_TABLET | Freq: Every day | ORAL | Status: DC
  Administered 2020-10-16: 1 via ORAL
  Filled 2020-10-15 (×4): qty 1

## 2020-10-15 MED ORDER — QUETIAPINE FUMARATE 50 MG PO TABS
50.0000 mg | ORAL_TABLET | Freq: Every day | ORAL | Status: DC
  Administered 2020-10-15: 50 mg via ORAL
  Filled 2020-10-15 (×3): qty 1

## 2020-10-15 MED ORDER — BACLOFEN 10 MG PO TABS
10.0000 mg | ORAL_TABLET | Freq: Three times a day (TID) | ORAL | Status: DC
  Administered 2020-10-16 (×2): 10 mg via ORAL
  Filled 2020-10-15 (×8): qty 1

## 2020-10-15 MED ORDER — CLONAZEPAM 0.5 MG PO TABS
0.5000 mg | ORAL_TABLET | Freq: Every day | ORAL | Status: DC | PRN
  Administered 2020-10-16: 0.5 mg via ORAL
  Filled 2020-10-15: qty 1

## 2020-10-15 MED ORDER — DULOXETINE HCL 60 MG PO CPEP
120.0000 mg | ORAL_CAPSULE | Freq: Every day | ORAL | Status: DC
  Administered 2020-10-16: 120 mg via ORAL
  Filled 2020-10-15 (×3): qty 2

## 2020-10-15 NOTE — Progress Notes (Signed)
The patient verbalized in group that she had a bad day but did not divulge any additional details. Her goal for tomorrow is to participate more in groups and in programming.

## 2020-10-15 NOTE — Progress Notes (Signed)
   10/15/20 2010  COVID-19 Daily Checkoff  Have you had a fever (temp > 37.80C/100F)  in the past 24 hours?  No  COVID-19 EXPOSURE  Have you traveled outside the state in the past 14 days? No  Have you been in contact with someone with a confirmed diagnosis of COVID-19 or PUI in the past 14 days without wearing appropriate PPE? No  Have you been living in the same home as a person with confirmed diagnosis of COVID-19 or a PUI (household contact)? No  Have you been diagnosed with COVID-19? No

## 2020-10-15 NOTE — Tx Team (Signed)
Initial Treatment Plan 10/15/2020 7:35 PM Natasha [name] [id number]    PATIENT STRESSORS: Financial difficulties Health problems Occupational concerns Traumatic event   PATIENT STRENGTHS: Active sense of humor Average or above average intelligence Capable of independent living Communication skills Financial means General fund of knowledge Motivation for treatment/growth Work skills   PATIENT IDENTIFIED PROBLEMS: SI  anxiety  depression  Isolation               DISCHARGE CRITERIA:  Ability to meet basic life and health needs Adequate post-discharge living arrangements Improved stabilization in mood, thinking, and/or behavior Reduction of life-threatening or endangering symptoms to within safe limits Safe-care adequate arrangements made Verbal commitment to aftercare and medication compliance  PRELIMINARY DISCHARGE PLAN: Attend aftercare/continuing care group Outpatient therapy Return to previous living arrangement Return to previous work or school arrangements  PATIENT/FAMILY INVOLVEMENT: This treatment plan has been presented to and reviewed with the patient, Natasha [name] .  The patient has been given the opportunity to ask questions and make suggestions.  Wardell Heath, RN 10/15/2020, 7:35 PM
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
## 2020-10-16 ENCOUNTER — Encounter: Payer: Self-pay | Admitting: Psychiatry

## 2020-10-16 ENCOUNTER — Inpatient Hospital Stay
Admission: RE | Admit: 2020-10-16 | Discharge: 2020-10-22 | DRG: 885 | Disposition: A | Discharge status: Home / Self Care | Payer: Medicare Other | Source: Intra-hospital | Attending: Psychiatry | Admitting: Psychiatry

## 2020-10-16 ENCOUNTER — Other Ambulatory Visit: Payer: Self-pay | Admitting: Psychiatry

## 2020-10-16 ENCOUNTER — Other Ambulatory Visit: Payer: Self-pay

## 2020-10-16 DIAGNOSIS — X58XXXS Exposure to other specified factors, sequela: Secondary | ICD-10-CM | POA: Diagnosis present

## 2020-10-16 DIAGNOSIS — R45851 Suicidal ideations: Secondary | ICD-10-CM | POA: Diagnosis present

## 2020-10-16 DIAGNOSIS — G8250 Quadriplegia, unspecified: Secondary | ICD-10-CM | POA: Diagnosis present

## 2020-10-16 DIAGNOSIS — Z791 Long term (current) use of non-steroidal anti-inflammatories (NSAID): Secondary | ICD-10-CM | POA: Diagnosis not present

## 2020-10-16 DIAGNOSIS — F332 Major depressive disorder, recurrent severe without psychotic features: Secondary | ICD-10-CM | POA: Diagnosis present

## 2020-10-16 DIAGNOSIS — Z79899 Other long term (current) drug therapy: Secondary | ICD-10-CM | POA: Diagnosis not present

## 2020-10-16 DIAGNOSIS — G8929 Other chronic pain: Secondary | ICD-10-CM | POA: Diagnosis present

## 2020-10-16 DIAGNOSIS — Z9151 Personal history of suicidal behavior: Secondary | ICD-10-CM

## 2020-10-16 DIAGNOSIS — Z818 Family history of other mental and behavioral disorders: Secondary | ICD-10-CM

## 2020-10-16 DIAGNOSIS — F17210 Nicotine dependence, cigarettes, uncomplicated: Secondary | ICD-10-CM | POA: Diagnosis present

## 2020-10-16 DIAGNOSIS — S14106S Unspecified injury at C6 level of cervical spinal cord, sequela: Secondary | ICD-10-CM | POA: Diagnosis not present

## 2020-10-16 DIAGNOSIS — G8220 Paraplegia, unspecified: Secondary | ICD-10-CM | POA: Diagnosis present

## 2020-10-16 DIAGNOSIS — Z86718 Personal history of other venous thrombosis and embolism: Secondary | ICD-10-CM | POA: Diagnosis not present

## 2020-10-16 DIAGNOSIS — F419 Anxiety disorder, unspecified: Secondary | ICD-10-CM | POA: Diagnosis present

## 2020-10-16 LAB — URINALYSIS, ROUTINE W REFLEX MICROSCOPIC
Bilirubin Urine: NEGATIVE
Glucose, UA: NEGATIVE mg/dL
Hgb urine dipstick: NEGATIVE
Ketones, ur: 20 mg/dL — AB
Leukocytes,Ua: NEGATIVE
Nitrite: NEGATIVE
Protein, ur: NEGATIVE mg/dL
Specific Gravity, Urine: 1.012 (ref 1.005–1.030)
pH: 6 (ref 5.0–8.0)

## 2020-10-16 LAB — LIPID PANEL
Cholesterol: 236 mg/dL — ABNORMAL HIGH (ref 0–200)
HDL: 78 mg/dL (ref >40)
LDL Cholesterol: 139 mg/dL — ABNORMAL HIGH (ref 0–99)
Total CHOL/HDL Ratio: 3 RATIO
Triglycerides: 94 mg/dL (ref <150)
VLDL: 19 mg/dL (ref 0–40)

## 2020-10-16 LAB — CBC
HCT: 46.3 % — ABNORMAL HIGH (ref 36.0–46.0)
Hemoglobin: 14.8 g/dL (ref 12.0–15.0)
MCH: 28.5 pg (ref 26.0–34.0)
MCHC: 32 g/dL (ref 30.0–36.0)
MCV: 89 fL (ref 80.0–100.0)
Platelets: 252 10*3/uL (ref 150–400)
RBC: 5.2 MIL/uL — ABNORMAL HIGH (ref 3.87–5.11)
RDW: 15.2 % (ref 11.5–15.5)
WBC: 7.5 10*3/uL (ref 4.0–10.5)
nRBC: 0 % (ref 0.0–0.2)

## 2020-10-16 LAB — COMPREHENSIVE METABOLIC PANEL
ALT: 19 U/L (ref 0–44)
AST: 21 U/L (ref 15–41)
Albumin: 4.2 g/dL (ref 3.5–5.0)
Alkaline Phosphatase: 75 U/L (ref 38–126)
Anion gap: 10 (ref 5–15)
BUN: 11 mg/dL (ref 6–20)
CO2: 25 mmol/L (ref 22–32)
Calcium: 9.3 mg/dL (ref 8.9–10.3)
Chloride: 102 mmol/L (ref 98–111)
Creatinine, Ser: 0.65 mg/dL (ref 0.44–1.00)
GFR, Estimated: >60 mL/min (ref >60)
Glucose, Bld: 83 mg/dL (ref 70–99)
Potassium: 4.1 mmol/L (ref 3.5–5.1)
Sodium: 137 mmol/L (ref 135–145)
Total Bilirubin: 1 mg/dL (ref 0.3–1.2)
Total Protein: 7.3 g/dL (ref 6.5–8.1)

## 2020-10-16 LAB — RAPID URINE DRUG SCREEN, HOSP PERFORMED
Amphetamines: NOT DETECTED
Barbiturates: NOT DETECTED
Benzodiazepines: NOT DETECTED
Cocaine: NOT DETECTED
Opiates: NOT DETECTED
Tetrahydrocannabinol: NOT DETECTED

## 2020-10-16 LAB — ETHANOL: Alcohol, Ethyl (B): <10 mg/dL (ref <10)

## 2020-10-16 LAB — HEMOGLOBIN A1C
Hgb A1c MFr Bld: 4.6 % — ABNORMAL LOW (ref 4.8–5.6)
Mean Plasma Glucose: 85.32 mg/dL

## 2020-10-16 LAB — TSH: TSH: 3.615 u[IU]/mL (ref 0.350–4.500)

## 2020-10-16 MED ORDER — ONDANSETRON 4 MG PO TBDP: 4.0000 mg | ORAL_TABLET | Freq: Four times a day (QID) | ORAL | Status: AC | PRN

## 2020-10-16 MED ORDER — MAGNESIUM HYDROXIDE 400 MG/5ML PO SUSP: 30.0000 mL | Freq: Every day | ORAL | Status: DC | PRN

## 2020-10-16 MED ORDER — CLONAZEPAM 0.5 MG PO TABS
0.5000 mg | ORAL_TABLET | Freq: Every day | ORAL | Status: DC | PRN
  Administered 2020-10-18: 0.5 mg via ORAL
  Filled 2020-10-16: qty 1

## 2020-10-16 MED ORDER — NICOTINE 21 MG/24HR TD PT24
21.0000 mg | MEDICATED_PATCH | Freq: Every day | TRANSDERMAL | Status: DC
  Administered 2020-10-19: 21 mg via TRANSDERMAL
  Filled 2020-10-16 (×2): qty 1

## 2020-10-16 MED ORDER — QUETIAPINE FUMARATE 100 MG PO TABS
100.0000 mg | ORAL_TABLET | Freq: Every day | ORAL | Status: DC
  Filled 2020-10-16 (×2): qty 1

## 2020-10-16 MED ORDER — DULOXETINE HCL 30 MG PO CPEP
120.0000 mg | ORAL_CAPSULE | Freq: Every day | ORAL | Status: DC
  Administered 2020-10-18 – 2020-10-21 (×4): 120 mg via ORAL
  Filled 2020-10-16 (×4): qty 4

## 2020-10-16 MED ORDER — THIAMINE HCL 100 MG PO TABS
100.0000 mg | ORAL_TABLET | Freq: Every day | ORAL | Status: DC
  Administered 2020-10-16 – 2020-10-21 (×5): 100 mg via ORAL
  Filled 2020-10-16 (×4): qty 1

## 2020-10-16 MED ORDER — ACETAMINOPHEN 325 MG PO TABS: 650.0000 mg | ORAL_TABLET | Freq: Four times a day (QID) | ORAL | Status: DC | PRN

## 2020-10-16 MED ORDER — BUSPIRONE HCL 5 MG PO TABS
10.0000 mg | ORAL_TABLET | Freq: Three times a day (TID) | ORAL | Status: DC
  Administered 2020-10-16 – 2020-10-21 (×12): 10 mg via ORAL
  Filled 2020-10-16 (×12): qty 2

## 2020-10-16 MED ORDER — ADULT MULTIVITAMIN W/MINERALS CH
1.0000 | ORAL_TABLET | Freq: Every day | ORAL | Status: DC
  Administered 2020-10-16 – 2020-10-21 (×5): 1 via ORAL
  Filled 2020-10-16 (×4): qty 1

## 2020-10-16 MED ORDER — HYDROXYZINE HCL 25 MG PO TABS: 25.0000 mg | ORAL_TABLET | Freq: Four times a day (QID) | ORAL | Status: AC | PRN

## 2020-10-16 MED ORDER — ALUM & MAG HYDROXIDE-SIMETH 200-200-20 MG/5ML PO SUSP: 30.0000 mL | ORAL | Status: DC | PRN

## 2020-10-16 MED ORDER — QUETIAPINE FUMARATE 100 MG PO TABS: 100.0000 mg | ORAL_TABLET | Freq: Every day | ORAL | 0 refills | Status: DC

## 2020-10-16 MED ORDER — ACETAMINOPHEN 325 MG PO TABS
650.0000 mg | ORAL_TABLET | Freq: Four times a day (QID) | ORAL | Status: DC | PRN
  Administered 2020-10-17 – 2020-10-21 (×4): 650 mg via ORAL
  Filled 2020-10-16 (×4): qty 2

## 2020-10-16 MED ORDER — BACLOFEN 10 MG PO TABS
10.0000 mg | ORAL_TABLET | Freq: Three times a day (TID) | ORAL | Status: DC
  Administered 2020-10-16 – 2020-10-21 (×12): 10 mg via ORAL
  Filled 2020-10-16 (×12): qty 1

## 2020-10-16 MED ORDER — QUETIAPINE FUMARATE 100 MG PO TABS
100.0000 mg | ORAL_TABLET | Freq: Every day | ORAL | Status: DC
  Administered 2020-10-16 – 2020-10-21 (×6): 100 mg via ORAL
  Filled 2020-10-16 (×6): qty 1

## 2020-10-16 MED ORDER — LORAZEPAM 1 MG PO TABS: 1.0000 mg | ORAL_TABLET | Freq: Four times a day (QID) | ORAL | Status: DC | PRN

## 2020-10-16 MED ORDER — DULOXETINE HCL 60 MG PO CPEP: 120.0000 mg | ORAL_CAPSULE | Freq: Every day | ORAL | 0 refills | Status: DC

## 2020-10-16 MED ORDER — LOPERAMIDE HCL 2 MG PO CAPS: 2.0000 mg | ORAL_CAPSULE | ORAL | Status: AC | PRN

## 2020-10-16 NOTE — BHH Suicide Risk Assessment (Signed)
[facility] Discharge Suicide Risk Assessment   Principal Problem: <principal problem not specified> Discharge Diagnoses: Active Problems:   MDD (major depressive disorder), recurrent episode, severe (HCC)   Total Time spent with patient: 20 minutes  Musculoskeletal: Strength & Muscle Tone: abnormal Gait & Station: unsteady, unable to stand uses a wheel chair Patient leans: N/A  Psychiatric Specialty Exam: Review of Systems  Blood pressure 118/77, pulse (!) 108, temperature 98 F (36.7 C), temperature source Oral, resp. rate 18, height 5' (1.524 m), weight 65.8 kg, SpO2 100 %.Body mass index is 28.32 kg/m.  General Appearance: Casual  Eye Contact::  Good  Speech:  Clear and Coherent409  Volume:  Normal  Mood:  Depressed  Affect:  Depressed  Thought Process:  Coherent  Orientation:  Full (Time, Place, and Person)  Thought Content:  Logical  Suicidal Thoughts:  Yes.  without intent/plan  Homicidal Thoughts:  No  Memory:  Recent;   Fair  Judgement:  Fair  Insight:  Fair  Psychomotor Activity:  Normal  Concentration:  Fair  Recall:  [employer] of Knowledge:Fair  Language: Fair  Akathisia:  No  Handed:  Right  AIMS (if indicated):     Assets:  Communication Skills Desire for Improvement Resilience Social Support  Sleep:  Number of Hours: 6.25  Cognition: WNL  ADL's:  Intact   Mental Status Per Nursing Assessment::   On Admission:  Suicidal ideation indicated by patient, Self-harm thoughts, Self-harm behaviors, Suicide plan, Intention to act on suicide plan, Plan includes specific time, place, or method, Belief that plan would result in death  Demographic Factors:  Caucasian  Loss Factors: Decline in physical health  Historical Factors: Prior suicide attempts  Risk Reduction Factors:   Positive social support, Positive therapeutic relationship and Positive coping skills or problem solving skills  Continued Clinical Symptoms:  Depression:   Hopelessness  Cognitive  Features That Contribute To Risk:  None    Suicide Risk:  Moderate:  Frequent suicidal ideation with limited intensity, and duration, some specificity in terms of plans, no associated intent, good self-control, limited dysphoria/symptomatology, some risk factors present, and identifiable protective factors, including available and accessible social support.   Follow-up Information    [facility] Follow up.   Specialty: Behavioral Health Why: sees for medication management Contact information: [address] [place] [address] [phone]               Plan Of Care/Follow-up recommendations:  Other:  Patient will be discharged from our facility. She will be admitted to a facility with ECT services available  Clement Sayres, MD 10/16/2020, 3:12 PM

## 2020-10-16 NOTE — Progress Notes (Signed)
Patient admitted from [facility] with MDD for ECT.Patient pleasant and cooperative during admission assessment. Patient denies SI/HI at this time. Patient denies AVH. Patient informed of fall risk status, fall risk assessed "high" at this time.Patient has Spinal cord injury from a suicidal attempt in 2002.Patient is on wheelchair now.Patient is independent with transfers and ADL's. Patient oriented to unit/staff/room. Patient denies any questions/concerns at this time. Patient safe on unit with Q15 minute checks for safety. Skin assessment and body search done.No contraband found.
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
## 2020-10-16 NOTE — Progress Notes (Signed)
   10/16/20 0643  Vital Signs  Temp 98 F (36.7 C)  Temp Source Oral  Pulse Rate 86  BP 126/75  BP Location Left Arm  BP Method Automatic  Patient Position (if appropriate) Sitting  Discharge Note:  Patient denies SI/HI AVH at this time. Discharge instructions, AVS, prescriptions and transition record gone over with patient. Patient agrees to comply with medication management, follow-up visit, and outpatient therapy. Patient belongings returned to patient. Patient questions and concerns addressed and answered.  Patient discharging and going to [facility] by safe transport. Nurse to nurse report given to [facility]. All belongings returned.

## 2020-10-16 NOTE — BHH Group Notes (Signed)
BHH Group Notes:  (Nursing/MHT/Case Management/Adjunct)  Date:  10/16/2020  Time:  11:02 PM  Type of Therapy:  Group Therapy  Participation Level:  Did Not Attend   Mayra Neer 10/16/2020, 11:02 PM

## 2020-10-16 NOTE — BHH Suicide Risk Assessment (Signed)
BHH INPATIENT:  Family/Significant Other Suicide Prevention Education  SPE completed with patient, as patient refused to consent to family contact. SPI pamphlet provided to pt and pt was encouraged to share information with support network, ask questions, and talk about any concerns relating to SPE. Patient denies access to guns/firearms and verbalized understanding of information provided. Mobile Crisis information also provided to patient.  Fredirick Lathe, LCSWA Clinicial Social Worker [employer]

## 2020-10-16 NOTE — Progress Notes (Signed)
Nurse to nurse report given to Gigi at [facility] [phone].

## 2020-10-16 NOTE — Progress Notes (Signed)
Pt reports one of her stressors as her Mom passing away in May. 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 She said that she has been increasingly depressed lately. She felt like her rexulti had worked for 1 week at first, but then became ineffective. Pt denies SI/HI and AVH.She verbally agrees to approach staff immediately if she starts having any thoughts of hurting herself or anyone else. Active listening, reassurance, and support provided. Medications administered as ordered by MD. Q 15 min safety checks continue. Pt's safety has been maintained.   10/15/20 2010  Psych Admission Type (Psych Patients Only)  Admission Status Voluntary  Psychosocial Assessment  Patient Complaints Anxiety;Depression;Sadness;Worrying  Eye Contact Fair  Facial Expression Anxious;Sad  Affect Appropriate to circumstance;Depressed;Anxious;Sad  Speech Logical/coherent;Soft  Interaction Assertive  Motor Activity Slow;Unsteady  Appearance/Hygiene Unremarkable  Behavior Characteristics Cooperative;Appropriate to situation;Anxious;Calm  Mood Depressed;Anxious;Sad;Pleasant  Thought Process  Coherency WDL  Content WDL  Delusions None reported or observed  Perception WDL  Hallucination None reported or observed  Judgment Poor  Confusion None  Danger to Self  Current suicidal ideation? Denies  Danger to Others  Danger to Others None reported or observed

## 2020-10-16 NOTE — Progress Notes (Signed)
  [facility] Adult Case Management Discharge Plan :  Will you be returning to the same living situation after discharge:  No. Pt is being transferred to [facility] for ECT treatment. At discharge, do you have transportation home?: Yes,  Pt is being transferred to [facility] for ECT treatment via safe transport Do you have the ability to pay for your medications: Yes,  Pt is being transferred to [facility] for ECT treatment.  Release of information consent forms completed and in the chart;  Patient's signature needed at discharge.  Patient to Follow up at:  Follow-up Information    [facility] Follow up.   Specialty: Behavioral Health Why: sees for medication management Contact information: [address] [place] [address] [phone]               Next level of care provider has access to [facility] Link:yes  Safety Planning and Suicide Prevention discussed: Yes,  Pt is being transferred to [facility] for ECT treatment.     Felizardo Hoffmann, LCSWA 10/16/2020, 3:12 PM
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
## 2020-10-16 NOTE — Progress Notes (Addendum)
    Patient discharged today, see D/C summary         Blood Alcohol level:  Lab Results  Component Value Date   ETH <10 10/16/2020   ETH <10 10/18/2017    Metabolic Disorder Labs: Lab Results  Component Value Date   HGBA1C 4.6 (L) 10/16/2020   MPG 85.32 10/16/2020   MPG 105.41 05/26/2020   Lab Results  Component Value Date   PROLACTIN 121.9 (H) 02/03/2017   Lab Results  Component Value Date   CHOL 236 (H) 10/16/2020   TRIG 94 10/16/2020   HDL 78 10/16/2020   CHOLHDL 3.0 10/16/2020   VLDL 19 10/16/2020   LDLCALC 139 (H) 10/16/2020   LDLCALC 160 (H) 05/26/2020    Physical Findings: AIMS: Facial and Oral Movements Muscles of Facial Expression: None, normal Lips and Perioral Area: None, normal Jaw: None, normal Tongue: None, normal,Extremity Movements Upper (arms, wrists, hands, fingers): None, normal Lower (legs, knees, ankles, toes): None, normal, Trunk Movements Neck, shoulders, hips: None, normal, Overall Severity Severity of abnormal movements (highest score from questions above): None, normal Incapacitation due to abnormal movements: None, normal Patient's awareness of abnormal movements (rate only patient's report): No Awareness, Dental Status Current problems with teeth and/or dentures?: No Does patient usually wear dentures?: No  CIWA:  CIWA-Ar Total: 0 COWS:     Musculoskeletal: Strength & Muscle Tone: spastic patient has spinal cord injury from 2002 suicide attempt Gait & Station: unable to stand in wheelchair, may stand with some assistance Patient leans: N/A  Psychiatric Specialty Exam: Physical Exam Constitutional:      Appearance: Normal appearance.  HENT:     Head: Normocephalic and atraumatic.  Pulmonary:     Effort: Pulmonary effort is normal.  Skin:    Comments: Has a superficial erythmatous wound healing on left anteriorlateral neck.  Obvious old scars from cuts on the ventral forearms.  Neurological:     Mental Status: She is  alert.     Review of Systems  Cardiovascular: Positive for chest pain.       Chest pain associated with increased anxiety  Gastrointestinal: Negative for abdominal pain.  Neurological: Negative for headaches.    Blood pressure 118/77, pulse (!) 108, temperature 98 F (36.7 C), temperature source Oral, resp. rate 18, height 5' (1.524 m), weight 65.8 kg, SpO2 100 %.Body mass index is 28.32 kg/m.  General Appearance: Fairly Groomed  Eye Contact:  Fair  Speech:  Clear and Coherent  Volume:  Normal  Mood:  Dysphoric  Affect:  Depressed  Thought Process:  Coherent  Orientation:  Full (Time, Place, and Person)  Thought Content:  Logical  Suicidal Thoughts:  Yes.  without intent/plan  Homicidal Thoughts:  No  Memory:  Immediate;   Good Recent;   Good Remote;   Good  Judgement:  Fair  Insight:  Good  Psychomotor Activity:  Psychomotor Retardation  Concentration:  Concentration: Fair and Attention Span: Fair  Recall:  Good  Fund of Knowledge:  Good  Language:  Good  Akathisia:  No  Handed:  N/A  AIMS (if indicated):     Assets:  Communication Skills Desire for Improvement Resilience  ADL's:  Intact  Cognition:  WNL  Sleep:  Number of Hours: 6.25

## 2020-10-16 NOTE — Tx Team (Signed)
Initial Treatment Plan 10/16/2020 6:29 PM Natasha [name] LXB:[id number]    PATIENT STRESSORS: Financial difficulties Health problems Traumatic event   PATIENT STRENGTHS: Ability for insight Average or above average intelligence Capable of independent living Communication skills Motivation for treatment/growth Work skills   PATIENT IDENTIFIED PROBLEMS: Depression  Anxiety  Loneliness                  DISCHARGE CRITERIA:  Ability to meet basic life and health needs Adequate post-discharge living arrangements Medical problems require only outpatient monitoring Safe-care adequate arrangements made Verbal commitment to aftercare and medication compliance  PRELIMINARY DISCHARGE PLAN: Attend aftercare/continuing care group Return to previous living arrangement  PATIENT/FAMILY INVOLVEMENT: This treatment plan has been presented to and reviewed with the patient, Natasha [name], and/or family member,.  The patient and family have been given the opportunity to ask questions and make suggestions.  Leonarda Salon, RN 10/16/2020, 6:29 PM

## 2020-10-17 ENCOUNTER — Encounter: Payer: Self-pay | Admitting: Psychiatry

## 2020-10-17 ENCOUNTER — Other Ambulatory Visit: Payer: Self-pay | Admitting: Psychiatry

## 2020-10-17 ENCOUNTER — Inpatient Hospital Stay: Payer: Medicare Other | Admitting: Certified Registered Nurse Anesthetist

## 2020-10-17 LAB — GLUCOSE, CAPILLARY: Glucose-Capillary: 97 mg/dL (ref 70–99)

## 2020-10-17 MED ORDER — GLYCOPYRROLATE 0.2 MG/ML IJ SOLN: 0.1000 mg | Freq: Once | INTRAMUSCULAR | Status: AC

## 2020-10-17 MED ORDER — GLYCOPYRROLATE 0.2 MG/ML IJ SOLN
INTRAMUSCULAR | Status: AC
  Administered 2020-10-17: 0.1 mg via INTRAVENOUS
  Filled 2020-10-17: qty 1

## 2020-10-17 MED ORDER — SODIUM CHLORIDE 0.9 % IV SOLN
500.0000 mL | Freq: Once | INTRAVENOUS | Status: AC
  Administered 2020-10-17: 500 mL via INTRAVENOUS

## 2020-10-17 MED ORDER — KETOROLAC TROMETHAMINE 30 MG/ML IJ SOLN: 30.0000 mg | Freq: Once | INTRAMUSCULAR | Status: AC

## 2020-10-17 MED ORDER — METHOHEXITAL SODIUM 100 MG/10ML IV SOSY
PREFILLED_SYRINGE | INTRAVENOUS | Status: DC | PRN
  Administered 2020-10-17: 60 mg via INTRAVENOUS

## 2020-10-17 MED ORDER — KETOROLAC TROMETHAMINE 30 MG/ML IJ SOLN
INTRAMUSCULAR | Status: AC
  Administered 2020-10-17: 30 mg via INTRAVENOUS
  Filled 2020-10-17: qty 1

## 2020-10-17 MED ORDER — SUCCINYLCHOLINE CHLORIDE 20 MG/ML IJ SOLN
INTRAMUSCULAR | Status: DC | PRN
  Administered 2020-10-17: 80 mg via INTRAVENOUS
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
## 2020-10-17 NOTE — H&P (Signed)
Natasha [name] is an 49 y.o. female.   Chief Complaint: depression HPI: recurrent severe depression  Past Medical History:  Diagnosis Date  . Anxiety   . C6 spinal cord injury (HCC)   . Depression     Past Surgical History:  Procedure Laterality Date  . BACK SURGERY    . NECK SURGERY      Family History  Problem Relation Age of Onset  . Alcohol abuse Father   . Alcohol abuse Brother   . Depression Mother   . Alcohol abuse Mother   . Parkinson's disease Mother    Social History:  reports that she has been smoking cigarettes. She has been smoking about 1.00 pack per day. She has never used smokeless tobacco. She reports current alcohol use. She reports that she does not use drugs.  Allergies: No Known Allergies  Medications Prior to Admission  Medication Sig Dispense Refill  . baclofen (LIORESAL) 10 MG tablet Take 10 mg by mouth 3 (three) times daily.    . busPIRone (BUSPAR) 10 MG tablet Take 1 tablet (10 mg total) by mouth 3 (three) times daily. For anxiety 45 tablet 1  . clonazePAM (KLONOPIN) 0.5 MG tablet Take 0.5 mg by mouth daily as needed for anxiety.    . DULoxetine (CYMBALTA) 60 MG capsule Take 2 capsules (120 mg total) by mouth daily. 60 capsule 0  . meloxicam (MOBIC) 15 MG tablet Take 15 mg by mouth daily.    . QUEtiapine (SEROQUEL) 100 MG tablet Take 1 tablet (100 mg total) by mouth at bedtime. 30 tablet 0    Results for orders placed or performed during the hospital encounter of 10/16/20 (from the past 48 hour(s))  Glucose, capillary     Status: None   Collection Time: 10/17/20  6:58 AM  Result Value Ref Range   Glucose-Capillary 97 70 - 99 mg/dL    Comment: Glucose reference range applies only to samples taken after fasting for at least 8 hours.   No results found.  Review of Systems  Constitutional: Negative.   HENT: Negative.   Eyes: Negative.   Respiratory: Negative.   Cardiovascular: Negative.   Gastrointestinal: Negative.    Musculoskeletal: Negative.   Skin: Negative.   Neurological: Positive for weakness.  Psychiatric/Behavioral: Positive for dysphoric mood and suicidal ideas.    Blood pressure 135/80, pulse 75, temperature 98.4 F (36.9 C), temperature source Oral, resp. rate 18, height 5' (1.524 m), weight 65.8 kg, SpO2 98 %. Physical Exam Vitals and nursing note reviewed.  Constitutional:      Appearance: She is well-developed.  HENT:     Head: Normocephalic and atraumatic.  Eyes:     Conjunctiva/sclera: Conjunctivae normal.     Pupils: Pupils are equal, round, and reactive to light.  Cardiovascular:     Heart sounds: Normal heart sounds.  Pulmonary:     Effort: Pulmonary effort is normal.  Abdominal:     Palpations: Abdomen is soft.  Musculoskeletal:        General: Normal range of motion.     Cervical back: Normal range of motion.  Skin:    General: Skin is warm and dry.  Neurological:     Mental Status: She is alert.     Comments: quadriplegia  Psychiatric:        Attention and Perception: Attention normal.        Mood and Affect: Mood is depressed.        Speech: Speech normal.  Behavior: Behavior normal.        Thought Content: Thought content includes suicidal ideation. Thought content does not include suicidal plan.        Cognition and Memory: Cognition normal.        Judgment: Judgment normal.      Assessment/Plan ect treatment starting today  Mordecai Rasmussen, MD 10/17/2020, 11:59 AM

## 2020-10-17 NOTE — Progress Notes (Signed)
Recreation Therapy Notes  INPATIENT RECREATION THERAPY ASSESSMENT  Patient Details Name: Agusta Hackenberg MRN: [id number] DOB: Aug 06, 1971 Today's Date: 10/17/2020       Information Obtained From: Patient  Able to Participate in Assessment/Interview: Yes  Patient Presentation: Responsive  Reason for Admission (Per Patient): Active Symptoms  Patient Stressors:    Coping Skills:   Read, Loss adjuster, chartered  Leisure Interests (2+):  Music - Listen, Individual - Reading (Ride horses, music, meditation)  Frequency of Recreation/Participation: Weekly  Awareness of Community Resources:     [facility]:     Current Use:    If no, Barriers?:    Expressed Interest in [employer] Information:    [place] of Residence:  [place]  Patient Main Form of Transportation: Taxi  Patient Strengths:  Compassion  Patient Identified Areas of Improvement:  My coping skills  Patient Goal for Hospitalization:  To feel better  Current SI (including self-harm):  No  Current HI:  No  Current AVH: No  Staff Intervention Plan: Group Attendance, Collaborate with Interdisciplinary Treatment Team  Consent to Intern Participation: N/A  Genelda Roark 10/17/2020, 2:01 PM

## 2020-10-17 NOTE — Progress Notes (Signed)
Patient calm and cooperative during assessment, denying SI/HI/AVH. Patient endorses depression. Patient isolative to her room but stated it was from having ECT today and that she just wanted to rest. Patient compliant with medication administration per MD orders. Patient given education, support, and encouragement to be active in her treatment plan. Patient being monitored Q 15 minutes for safety. Pt remains safe on the unit

## 2020-10-17 NOTE — Plan of Care (Signed)
Flat and depressed but Has remained cooperative and pleasant. Alert and oriented and denied SI/HI. Patient is NP for ECT.

## 2020-10-17 NOTE — Transfer of Care (Signed)
Immediate Anesthesia Transfer of Care Note  Patient: Natasha [name]  Procedure(s) Performed: ECT TX  Patient Location: PACU  Anesthesia Type:General  Level of Consciousness: drowsy  Airway & Oxygen Therapy: Patient Spontanous Breathing and Patient connected to face mask oxygen  Post-op Assessment: Report given to RN and Post -op Vital signs reviewed and stable  Post vital signs: Reviewed and stable  Last Vitals:  Vitals Value Taken Time  BP    Temp    Pulse    Resp    SpO2      Last Pain:  Vitals:   10/17/20 1100  TempSrc:   PainSc: 0-No pain         Complications: No complications documented.

## 2020-10-17 NOTE — Progress Notes (Signed)
Recreation Therapy Notes   Date: 10/17/2020  Time: 9:30 am   Location: Craft room     Behavioral response: N/A   Intervention Topic: Emotions   Discussion/Intervention: Patient did not attend group.   Clinical Observations/Feedback:  Patient did not attend group.   Rishika Mccollom LRT/CTRS           Easter Ken[place]brew 10/17/2020 11:58 AM

## 2020-10-17 NOTE — [facility] Group Notes (Signed)
LCSW Group Therapy Note  10/17/2020 2:51 PM  Type of Therapy/Topic:  Group Therapy:  Emotion Regulation  Participation Level:  Did Not Attend   Description of Group:   The purpose of this group is to assist patients in learning to regulate negative emotions and experience positive emotions. Patients will be guided to discuss ways in which they have been vulnerable to their negative emotions. These vulnerabilities will be juxtaposed with experiences of positive emotions or situations, and patients will be challenged to use positive emotions to combat negative ones. Special emphasis will be placed on coping with negative emotions in conflict situations, and patients will process healthy conflict resolution skills.  Therapeutic Goals: 1. Patient will identify two positive emotions or experiences to reflect on in order to balance out negative emotions 2. Patient will label two or more emotions that they find the most difficult to experience 3. Patient will demonstrate positive conflict resolution skills through discussion and/or role plays  Summary of Patient Progress:  X  Therapeutic Modalities:   Cognitive Behavioral Therapy Feelings Identification Dialectical Behavioral Therapy  [name], MSW, LCSW 10/17/2020 2:51 PM

## 2020-10-17 NOTE — BHH Suicide Risk Assessment (Signed)
[facility] INPATIENT:  Family/Significant Other Suicide Prevention Education  Suicide Prevention Education:  Patient Refusal for Family/Significant Other Suicide Prevention Education: The patient Natasha [name] has refused to provide written consent for family/significant other to be provided Family/Significant Other Suicide Prevention Education during admission and/or prior to discharge.  Physician notified.  SPE completed with pt, as pt refused to consent to family contact. SPI pamphlet provided to pt and pt was encouraged to share information with support network, ask questions, and talk about any concerns relating to SPE. Pt denies access to guns/firearms and verbalized understanding of information provided. Mobile Crisis information also provided to pt.  [name] 10/17/2020, 3:55 PM
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
## 2020-10-17 NOTE — Progress Notes (Signed)
Patient alert and oriented x 4, affect is blunted thoughts are organized and coherent, she currently denies SI/HI/AVH, per report for ECT / depression she rated depression a 5/10 on scale ( 0 low - 10 high) she was noted interacting appropriately with peers and staff, she was offered emotional support and encouragement to attend evening wrap up group. Patient did attend evening group. Patient was complaint with scheduled evening medication regimen, 15 minutes safety checks maintained will continue to monitor.

## 2020-10-17 NOTE — Anesthesia Procedure Notes (Signed)
Performed by: Emrie Gayle, CRNA Pre-anesthesia Checklist: Patient identified, Patient being monitored, Timeout performed, Emergency Drugs available and Suction available Patient Re-evaluated:Patient Re-evaluated prior to induction Oxygen Delivery Method: Circle system utilized Preoxygenation: Pre-oxygenation with 100% oxygen Ventilation: Mask ventilation without difficulty Airway Equipment and Method: Bite block Dental Injury: Teeth and Oropharynx as per pre-operative assessment        

## 2020-10-17 NOTE — Anesthesia Preprocedure Evaluation (Signed)
Anesthesia Evaluation  Patient identified by MRN, date of birth, ID band Patient awake    Reviewed: Allergy & Precautions, H&P , NPO status , Patient's Chart, lab work & pertinent test results  History of Anesthesia Complications Negative for: history of anesthetic complications  Airway Mallampati: III  TM Distance: >3 FB Neck ROM: limited    Dental  (+) Chipped, Poor Dentition   Pulmonary neg shortness of breath, Current Smoker,    Pulmonary exam normal        Cardiovascular Exercise Tolerance: Good negative cardio ROS Normal cardiovascular exam     Neuro/Psych negative neurological ROS  negative psych ROS   GI/Hepatic negative GI ROS, Neg liver ROS,   Endo/Other  negative endocrine ROS  Renal/GU negative Renal ROS  negative genitourinary   Musculoskeletal   Abdominal   Peds  Hematology negative hematology ROS (+)   Anesthesia Other Findings Past Medical History: No date: Anxiety No date: C6 spinal cord injury (HCC) No date: Depression  Past Surgical History: No date: BACK SURGERY No date: NECK SURGERY  BMI    Body Mass Index: 28.32 kg/m      Reproductive/Obstetrics negative OB ROS                             Anesthesia Physical Anesthesia Plan  ASA: III  Anesthesia Plan: General   Post-op Pain Management:    Induction: Intravenous  PONV Risk Score and Plan: Propofol infusion and TIVA  Airway Management Planned: Mask  Additional Equipment:   Intra-op Plan:   Post-operative Plan:   Informed Consent: I have reviewed the patients History and Physical, chart, labs and discussed the procedure including the risks, benefits and alternatives for the proposed anesthesia with the patient or authorized representative who has indicated his/her understanding and acceptance.     Dental Advisory Given  Plan Discussed with: Anesthesiologist, CRNA and Surgeon  Anesthesia  Plan Comments: (Patient consented for risks of anesthesia including but not limited to:  - adverse reactions to medications - risk of intubation if required - damage to eyes, teeth, lips or other oral mucosa - nerve damage due to positioning  - sore throat or hoarseness - Damage to heart, brain, nerves, lungs, other parts of body or loss of life  Patient voiced understanding.)        Anesthesia Quick Evaluation

## 2020-10-17 NOTE — H&P (Signed)
Psychiatric Admission Assessment Adult  Patient Identification: Natasha [name] MRN:  [id number] Date of Evaluation:  10/17/2020 Chief Complaint:  Severe recurrent major depression without psychotic features (HCC) [F33.2] Principal Diagnosis: Severe recurrent major depression without psychotic features (HCC) Diagnosis:  Principal Problem:   Severe recurrent major depression without psychotic features (HCC) Active Problems:   Paraplegia (HCC)   History of DVT (deep vein thrombosis)  History of Present Illness: Patient seen chart reviewed.  Patient known from previous encounters.  49 year old woman with chronic recurrent severe depression who is transferred to [place] from [facility].  Patient had presented there a couple days ago with a return of severe depression.  She reports that her depression has been back and bothering her at least since her mother passed away 6 months ago but things have escalated in the past couple weeks to months.  She had been feeling sad down and negative.  Energy level is been very low.  Rarely getting out of bed.  Not eating well.  She says she was still compliant with her medicine but was feeling more hopeless.  Starting to have intrusive suicidal thoughts.  Cut herself superficially on the neck.  Patient currently says she has suicidal thoughts but no intention or plan of acting on them.  Patient actually came to the hospital in [facility] requesting ECT because of her history of good response and good tolerance previously.  Patient has not been abusing any alcohol or drugs.  Major life stresses her chronic loneliness chronic medical impairments and then the recent death of her mother with whom she was pretty close.  No current psychotic symptoms.  No new physical issues. Associated Signs/Symptoms: Depression Symptoms:  depressed mood, anhedonia, psychomotor retardation, fatigue, difficulty concentrating, hopelessness, suicidal thoughts  without plan, Duration of Depression Symptoms: No data recorded (Hypo) Manic Symptoms:  Nonspecific Anxiety Symptoms:  Excessive Worry, Psychotic Symptoms:  None Duration of Psychotic Symptoms: No data recorded PTSD Symptoms: History of chronic anxiety.  Patient has been through some traumatic events in the past.  Somewhat difficult to differentiate possible PTSD from chronic recurrent severe depression Total Time spent with patient: 1 hour  Past Psychiatric History: Patient has a history of recurrent severe depression and also notably has a history of more than 1 serious suicide attempt in the past.  She is quadriplegic as a result of a suicide attempt years ago.  Generally she is cooperative with appropriate psychiatric treatment but has had trouble with the practicality of compliance especially in the area of COVID.  Past history of good response to electroconvulsive therapy with good tolerance.  Is the patient at risk to self? Yes.    Has the patient been a risk to self in the past 6 months? Yes.    Has the patient been a risk to self within the distant past? Yes.    Is the patient a risk to others? No.  Has the patient been a risk to others in the past 6 months? No.  Has the patient been a risk to others within the distant past? No.   Prior Inpatient Therapy:   Prior Outpatient Therapy:    Alcohol Screening: 1. How often do you have a drink containing alcohol?: Monthly or less 2. How many drinks containing alcohol do you have on a typical day when you are drinking?: 1 or 2 3. How often do you have six or more drinks on one occasion?: Never AUDIT-C Score: 1 5. How often during the last  year have you failed to do what was normally expected from you because of drinking?: Never 6. How often during the last year have you needed a first drink in the morning to get yourself going after a heavy drinking session?: Never 7. How often during the last year have you had a feeling of guilt of  remorse after drinking?: Never 8. How often during the last year have you been unable to remember what happened the night before because you had been drinking?: Never 9. Have you or someone else been injured as a result of your drinking?: No 10. Has a relative or friend or a doctor or another health worker been concerned about your drinking or suggested you cut down?: No Alcohol Use Disorder Identification Test Final Score (AUDIT): 1 Alcohol Brief Interventions/Follow-up: AUDIT Score <7 follow-up not indicated Substance Abuse History in the last 12 months:  No. Consequences of Substance Abuse: Negative Previous Psychotropic Medications: Yes  Psychological Evaluations: Yes  Past Medical History:  Past Medical History:  Diagnosis Date  . Anxiety   . C6 spinal cord injury (HCC)   . Depression     Past Surgical History:  Procedure Laterality Date  . BACK SURGERY    . NECK SURGERY     Family History:  Family History  Problem Relation Age of Onset  . Alcohol abuse Father   . Alcohol abuse Brother   . Depression Mother   . Alcohol abuse Mother   . Parkinson's disease Mother    Family Psychiatric  History: Positive for depression and alcohol abuse and several other family members Tobacco Screening: Have you used any form of tobacco in the last 30 days? (Cigarettes, Smokeless Tobacco, Cigars, and/or Pipes): No Social History:  Social History   Substance and Sexual Activity  Alcohol Use Yes  . Alcohol/week: 0.0 standard drinks   Comment: Occasional use     Social History   Substance and Sexual Activity  Drug Use No    Additional Social History:                           Allergies:  No Known Allergies Lab Results:  Results for orders placed or performed during the hospital encounter of 10/16/20 (from the past 48 hour(s))  Glucose, capillary     Status: None   Collection Time: 10/17/20  6:58 AM  Result Value Ref Range   Glucose-Capillary 97 70 - 99 mg/dL     Comment: Glucose reference range applies only to samples taken after fasting for at least 8 hours.    Blood Alcohol level:  Lab Results  Component Value Date   ETH <10 10/16/2020   ETH <10 10/18/2017    Metabolic Disorder Labs:  Lab Results  Component Value Date   HGBA1C 4.6 (L) 10/16/2020   MPG 85.32 10/16/2020   MPG 105.41 05/26/2020   Lab Results  Component Value Date   PROLACTIN 121.9 (H) 02/03/2017   Lab Results  Component Value Date   CHOL 236 (H) 10/16/2020   TRIG 94 10/16/2020   HDL 78 10/16/2020   CHOLHDL 3.0 10/16/2020   VLDL 19 10/16/2020   LDLCALC 139 (H) 10/16/2020   LDLCALC 160 (H) 05/26/2020    Current Medications: Current Facility-Administered Medications  Medication Dose Route Frequency Provider Last Rate Last Admin  . acetaminophen (TYLENOL) tablet 650 mg  650 mg Oral Q6H PRN Jeniya Flannigan, Jackquline Denmark, MD      . alum & mag  hydroxide-simeth (MAALOX/MYLANTA) 200-200-20 MG/5ML suspension 30 mL  30 mL Oral Q4H PRN Laken Lobato T, MD      . baclofen (LIORESAL) tablet 10 mg  10 mg Oral TID Tyshell Ramberg, Jackquline DenmarkJohn T, MD   10 mg at 10/16/20 1733  . busPIRone (BUSPAR) tablet 10 mg  10 mg Oral TID Kymari Nuon, Jackquline DenmarkJohn T, MD   10 mg at 10/16/20 1732  . clonazePAM (KLONOPIN) tablet 0.5 mg  0.5 mg Oral Daily PRN Rahmon Heigl T, MD      . DULoxetine (CYMBALTA) DR capsule 120 mg  120 mg Oral Daily Marlette Curvin T, MD      . hydrOXYzine (ATARAX/VISTARIL) tablet 25 mg  25 mg Oral Q6H PRN Jaiyla Granados T, MD      . loperamide (IMODIUM) capsule 2-4 mg  2-4 mg Oral PRN Avneet Ashmore T, MD      . magnesium hydroxide (MILK OF MAGNESIA) suspension 30 mL  30 mL Oral Daily PRN Yazir Koerber T, MD      . multivitamin with minerals tablet 1 tablet  1 tablet Oral Daily Kaeley Vinje, Jackquline DenmarkJohn T, MD   1 tablet at 10/16/20 2100  . nicotine (NICODERM CQ - dosed in mg/24 hours) patch 21 mg  21 mg Transdermal Daily Rachel Samples T, MD      . ondansetron (ZOFRAN-ODT) disintegrating tablet 4 mg  4 mg Oral Q6H PRN Margeret Stachnik,  Korrey Schleicher T, MD      . QUEtiapine (SEROQUEL) tablet 100 mg  100 mg Oral QHS Bobi Daudelin T, MD   100 mg at 10/16/20 2126  . thiamine tablet 100 mg  100 mg Oral Daily Delene Morais, Jackquline DenmarkJohn T, MD   100 mg at 10/16/20 2100   PTA Medications: Medications Prior to Admission  Medication Sig Dispense Refill Last Dose  . baclofen (LIORESAL) 10 MG tablet Take 10 mg by mouth 3 (three) times daily.     . busPIRone (BUSPAR) 10 MG tablet Take 1 tablet (10 mg total) by mouth 3 (three) times daily. For anxiety 45 tablet 1   . clonazePAM (KLONOPIN) 0.5 MG tablet Take 0.5 mg by mouth daily as needed for anxiety.     . DULoxetine (CYMBALTA) 60 MG capsule Take 2 capsules (120 mg total) by mouth daily. 60 capsule 0   . meloxicam (MOBIC) 15 MG tablet Take 15 mg by mouth daily.     . QUEtiapine (SEROQUEL) 100 MG tablet Take 1 tablet (100 mg total) by mouth at bedtime. 30 tablet 0     Musculoskeletal: Strength & Muscle Tone: decreased Gait & Station: unable to stand Patient leans: N/A  Psychiatric Specialty Exam: Physical Exam Vitals and nursing note reviewed.  Constitutional:      Appearance: She is well-developed.  HENT:     Head: Normocephalic and atraumatic.  Eyes:     Conjunctiva/sclera: Conjunctivae normal.     Pupils: Pupils are equal, round, and reactive to light.  Cardiovascular:     Heart sounds: Normal heart sounds.  Pulmonary:     Effort: Pulmonary effort is normal.  Abdominal:     Palpations: Abdomen is soft.  Musculoskeletal:        General: Normal range of motion.     Cervical back: Normal range of motion.  Skin:    General: Skin is warm and dry.  Neurological:     Mental Status: She is alert.     Comments: Patient is a quadriplegic with chronic paralysis in her lower extremities.  Partial paralysis with some retained  abilities in her arms.  No change from her baseline impaired functioning  Psychiatric:        Attention and Perception: Attention normal.        Mood and Affect: Mood is  depressed. Affect is blunt.        Speech: Speech is delayed.        Behavior: Behavior is slowed.        Thought Content: Thought content is not paranoid or delusional. Thought content includes suicidal ideation. Thought content does not include suicidal plan.        Cognition and Memory: Cognition normal.        Judgment: Judgment normal.     Review of Systems  Constitutional: Negative.   HENT: Negative.   Eyes: Negative.   Respiratory: Negative.   Cardiovascular: Negative.   Gastrointestinal: Negative.   Musculoskeletal: Negative.   Skin: Negative.   Neurological: Positive for weakness.  Psychiatric/Behavioral: Positive for dysphoric mood and suicidal ideas. The patient is nervous/anxious.     Blood pressure 120/77, pulse 80, temperature 98.4 F (36.9 C), temperature source Oral, resp. rate 18, height 5' (1.524 m), weight 67.6 kg, SpO2 96 %.Body mass index is 29.1 kg/m.  General Appearance: Casual  Eye Contact:  Fair  Speech:  Slow  Volume:  Decreased  Mood:  Depressed  Affect:  Congruent  Thought Process:  Coherent  Orientation:  Full (Time, Place, and Person)  Thought Content:  Logical  Suicidal Thoughts:  Yes.  without intent/plan  Homicidal Thoughts:  No  Memory:  Immediate;   Fair Recent;   Fair Remote;   Fair  Judgement:  Fair  Insight:  Fair  Psychomotor Activity:  Decreased  Concentration:  Concentration: Fair  Recall:  [employer] of Knowledge:  Fair  Language:  Fair  Akathisia:  No  Handed:  Right  AIMS (if indicated):     Assets:  Desire for Improvement Housing  ADL's:  Impaired  Cognition:  WNL  Sleep:  Number of Hours: 8.5    Treatment Plan Summary: Daily contact with patient to assess and evaluate symptoms and progress in treatment, Medication management and Plan AndPatient is currently on appropriate antidepressant medicine but continues to have severe depressive symptoms with suicidal ideation and is high risk for suicidal behavior given her  history.  Patient is a good candidate for electroconvulsive therapy given her past history of good response and tolerance her preference for the treatment.  Scheduled to receive ECT treatment today, Friday, and continue on 3 times a week schedule.  Based on previous experience would anticipate possibly a week or so of treatment and then we can hope that the patient will be improved to the point that she can be discharged.  For now no change to medication.  Observation Level/Precautions:  15 minute checks  Laboratory:  EKG  Psychotherapy:    Medications:    Consultations:    Discharge Concerns:    Estimated LOS:  Other:     Physician Treatment Plan for Primary Diagnosis: Severe recurrent major depression without psychotic features (HCC) Long Term Goal(s): Improvement in symptoms so as ready for discharge  Short Term Goals: Ability to verbalize feelings will improve, Ability to disclose and discuss suicidal ideas and Ability to demonstrate self-control will improve  Physician Treatment Plan for Secondary Diagnosis: Principal Problem:   Severe recurrent major depression without psychotic features (HCC) Active Problems:   Paraplegia (HCC)   History of DVT (deep vein thrombosis)  Long Term Goal(s):  Improvement in symptoms so as ready for discharge  Short Term Goals: Ability to demonstrate self-control will improve and Ability to maintain clinical measurements within normal limits will improve  I certify that inpatient services furnished can reasonably be expected to improve the patient's condition.    Mordecai Rasmussen, MD 11/5/202110:18 AM

## 2020-10-17 NOTE — Plan of Care (Signed)
Patient started ECT today and stated that it went well   Problem: Education: Goal: Emotional status will improve Outcome: Progressing Goal: Mental status will improve Outcome: Progressing

## 2020-10-17 NOTE — Procedures (Signed)
ECT SERVICES Physician's Interval Evaluation & Treatment Note  Patient Identification: Natasha [name] MRN:  [id number] Date of Evaluation:  10/17/2020 TX #: 1  MADRS:   MMSE:   P.E. Findings:  Patient with chronic quadriplegia.  Otherwise stable.  No new medical problems.  Heart and lungs normal.  Vitals stable  Psychiatric Interval Note:  Patient reporting worsening depression with suicidal thoughts  Subjective:  Patient is a 49 y.o. female seen for evaluation for Electroconvulsive Therapy. Depression but no psychosis good insight  Treatment Summary:   [x]   Right Unilateral             []  Bilateral   % Energy : 0.3 ms 45%   Impedance: 1670 ohms  Seizure Energy Index: 92,677 V squared  Postictal Suppression Index: No reading  Seizure Concordance Index: 34%  Medications  Pre Shock: Brevital 60 mg succinylcholine 80 mg  Post Shock:    Seizure Duration: EMG not measured due to quadriplegia.  EEG 70 seconds.   Comments: Poor readings by the computer secondary to some looseness in one of the electrodes and failure to place EMG leads.  Clearly had a seizure that went out to 70 seconds.  No change to parameters.  Next treatment scheduled for Monday  Lungs:  [x]   Clear to auscultation               []  Other:   Heart:    [x]   Regular rhythm             []  irregular rhythm    [x]   Previous H&P reviewed, patient examined and there are NO CHANGES                 []   Previous H&P reviewed, patient examined and there are changes noted.   , MD 11/5/20215:09 PM

## 2020-10-17 NOTE — Tx Team (Signed)
Interdisciplinary Treatment and Diagnostic Plan Update  10/17/2020 Time of Session: 9:00AM Natasha [name] MRN: [id number]  Principal Diagnosis: <principal problem not specified>  Secondary Diagnoses: Active Problems:   Severe recurrent major depression without psychotic features (HCC)   Current Medications:  Current Facility-Administered Medications  Medication Dose Route Frequency Provider Last Rate Last Admin  . acetaminophen (TYLENOL) tablet 650 mg  650 mg Oral Q6H PRN Clapacs, John T, MD      . alum & mag hydroxide-simeth (MAALOX/MYLANTA) 200-200-20 MG/5ML suspension 30 mL  30 mL Oral Q4H PRN Clapacs, John T, MD      . baclofen (LIORESAL) tablet 10 mg  10 mg Oral TID Clapacs, Jackquline Denmark, MD   10 mg at 10/16/20 1733  . busPIRone (BUSPAR) tablet 10 mg  10 mg Oral TID Clapacs, Jackquline Denmark, MD   10 mg at 10/16/20 1732  . clonazePAM (KLONOPIN) tablet 0.5 mg  0.5 mg Oral Daily PRN Clapacs, John T, MD      . DULoxetine (CYMBALTA) DR capsule 120 mg  120 mg Oral Daily Clapacs, John T, MD      . hydrOXYzine (ATARAX/VISTARIL) tablet 25 mg  25 mg Oral Q6H PRN Clapacs, John T, MD      . loperamide (IMODIUM) capsule 2-4 mg  2-4 mg Oral PRN Clapacs, John T, MD      . magnesium hydroxide (MILK OF MAGNESIA) suspension 30 mL  30 mL Oral Daily PRN Clapacs, John T, MD      . multivitamin with minerals tablet 1 tablet  1 tablet Oral Daily Clapacs, Jackquline Denmark, MD   1 tablet at 10/16/20 2100  . nicotine (NICODERM CQ - dosed in mg/24 hours) patch 21 mg  21 mg Transdermal Daily Clapacs, John T, MD      . ondansetron (ZOFRAN-ODT) disintegrating tablet 4 mg  4 mg Oral Q6H PRN Clapacs, John T, MD      . QUEtiapine (SEROQUEL) tablet 100 mg  100 mg Oral QHS Clapacs, John T, MD   100 mg at 10/16/20 2126  . thiamine tablet 100 mg  100 mg Oral Daily Clapacs, Jackquline Denmark, MD   100 mg at 10/16/20 2100   PTA Medications: Medications Prior to Admission  Medication Sig Dispense Refill Last Dose  . baclofen (LIORESAL) 10 MG  tablet Take 10 mg by mouth 3 (three) times daily.     . busPIRone (BUSPAR) 10 MG tablet Take 1 tablet (10 mg total) by mouth 3 (three) times daily. For anxiety 45 tablet 1   . clonazePAM (KLONOPIN) 0.5 MG tablet Take 0.5 mg by mouth daily as needed for anxiety.     . DULoxetine (CYMBALTA) 60 MG capsule Take 2 capsules (120 mg total) by mouth daily. 60 capsule 0   . meloxicam (MOBIC) 15 MG tablet Take 15 mg by mouth daily.     . QUEtiapine (SEROQUEL) 100 MG tablet Take 1 tablet (100 mg total) by mouth at bedtime. 30 tablet 0     Patient Stressors: Financial difficulties Health problems Traumatic event  Patient Strengths: Ability for insight Average or above average intelligence Capable of independent living Communication skills Motivation for treatment/growth Work skills  Treatment Modalities: Medication Management, Group therapy, Case management,  1 to 1 session with clinician, Psychoeducation, Recreational therapy.   Physician Treatment Plan for Primary Diagnosis: <principal problem not specified> Long Term Goal(s):     Short Term Goals:    Medication Management: Evaluate patient's response, side effects, and tolerance of medication regimen.  Therapeutic Interventions: 1 to 1 sessions, Unit Group sessions and Medication administration.  Evaluation of Outcomes: Not Progressing  Physician Treatment Plan for Secondary Diagnosis: Active Problems:   Severe recurrent major depression without psychotic features (HCC)  Long Term Goal(s):     Short Term Goals:       Medication Management: Evaluate patient's response, side effects, and tolerance of medication regimen.  Therapeutic Interventions: 1 to 1 sessions, Unit Group sessions and Medication administration.  Evaluation of Outcomes: Not Progressing   RN Treatment Plan for Primary Diagnosis: <principal problem not specified> Long Term Goal(s): Knowledge of disease and therapeutic regimen to maintain health will  improve  Short Term Goals: Ability to remain free from injury will improve, Ability to demonstrate self-control, Ability to participate in decision making will improve, Ability to verbalize feelings will improve, Ability to identify and develop effective coping behaviors will improve and Compliance with prescribed medications will improve  Medication Management: RN will administer medications as ordered by provider, will assess and evaluate patient's response and provide education to patient for prescribed medication. RN will report any adverse and/or side effects to prescribing provider.  Therapeutic Interventions: 1 on 1 counseling sessions, Psychoeducation, Medication administration, Evaluate responses to treatment, Monitor vital signs and CBGs as ordered, Perform/monitor CIWA, COWS, AIMS and Fall Risk screenings as ordered, Perform wound care treatments as ordered.  Evaluation of Outcomes: Not Progressing   LCSW Treatment Plan for Primary Diagnosis: <principal problem not specified> Long Term Goal(s): Safe transition to appropriate next level of care at discharge, Engage patient in therapeutic group addressing interpersonal concerns.  Short Term Goals: Engage patient in aftercare planning with referrals and resources, Increase social support, Increase ability to appropriately verbalize feelings, Increase emotional regulation, Facilitate acceptance of mental health diagnosis and concerns, Identify triggers associated with mental health/substance abuse issues and Increase skills for wellness and recovery  Therapeutic Interventions: Assess for all discharge needs, 1 to 1 time with Social worker, Explore available resources and support systems, Assess for adequacy in community support network, Educate family and significant other(s) on suicide prevention, Complete Psychosocial Assessment, Interpersonal group therapy.  Evaluation of Outcomes: Not Progressing   Progress in Treatment: Attending  groups: No. Participating in groups: No. Taking medication as prescribed: Yes. Toleration medication: Yes. Family/Significant other contact made: No, will contact:  when given permission. Patient understands diagnosis: Yes. Discussing patient identified problems/goals with staff: Yes. Medical problems stabilized or resolved: Yes. Denies suicidal/homicidal ideation: Yes. Issues/concerns per patient self-inventory: No. Other: None.  New problem(s) identified: No, Describe:  none.  New Short Term/Long Term Goal(s): medication management for mood stabilization; elimination of SI thoughts; development of comprehensive mental wellness plan.  Patient Goals: "I'm just here for ECT."  Discharge Plan or Barriers: Pt plans to discharge home with continued treatment through [facility].   Reason for Continuation of Hospitalization: Depression Medication stabilization  Estimated Length of Stay: 1-7 days  Attendees: Patient: Natasha [name] 10/17/2020 10:12 AM  Physician: Les Pou, MD 10/17/2020 10:12 AM  Nursing: Torrie Mayers, RN 10/17/2020 10:12 AM  RN Care Manager: 10/17/2020 10:12 AM  Social Worker: [name], MSW, LCSW 10/17/2020 10:12 AM  Recreational Therapist: Garret Reddish, CTRS, LRT 10/17/2020 10:12 AM  Other: Vilma Meckel. Algis Greenhouse, MSW, LCSW, LCAS 10/17/2020 10:12 AM  Other:  10/17/2020 10:12 AM  Other: 10/17/2020 10:12 AM    Scribe for Treatment Team: [name], LCSW 10/17/2020 10:12 AM

## 2020-10-18 DIAGNOSIS — F332 Major depressive disorder, recurrent severe without psychotic features: Principal | ICD-10-CM

## 2020-10-18 NOTE — [facility] Group Notes (Signed)
LCSW Aftercare Discharge Planning Group Note   10/18/2020 1:20pm-2:00pm   Type of Group and Topic: Psychoeducational Group:  Discharge Planning  Participation Level:  Active  Description of Group  Discharge planning group reviews patient's anticipated discharge plans and assists patients to anticipate and address any barriers to wellness/recovery in the community.  Suicide prevention education is reviewed with patients in group.   Therapeutic Goals 1. Patients will state their anticipated discharge plan and mental health aftercare 2. Patients will identify potential barriers to wellness in the community setting 3. Patients will engage in problem solving, solution focused discussion of ways to anticipate and address barriers to wellness/recovery   Summary of Patient Progress: Patient stated her goal is to go back to her apartment when she is discharged. Patient stated that she lives alone which causes some issues at times. Patient stated she has a little bit of support from her friends. Patient spoke about a brother in [place] that she occasionally speaks to. Patient stated she wants to work on getting out in the community more and out of her home.     Therapeutic Modalities: Motivational Interviewing    Susa Simmonds, Theresia Majors 10/18/2020 2:49 PM

## 2020-10-18 NOTE — Progress Notes (Signed)
[facility] MD Progress Note  10/18/2020 10:55 AM Natasha [name]  MRN:  [id number] Subjective:  49 year old woman with chronic recurrent severe depression who is transferred to [place] from [facility].  Patient had presented there a couple days ago with a return of severe depression.  She reports that her depression has been back and bothering her at least since her mother passed away 6 months ago but things have escalated in the past couple weeks to months.   I got tx ( ECT)  yesterday, I feel better today" Pt seen for follow up. Pt reports feeling better today , received first ECT yesterday, tolerated well.pt endorses sadness, intermittent passing SI, no intent and plan. Reports she had ECT in the past and that helped her and pt hopeful this will help her this time also. No evidence of psychosis,  Slept well.   Principal Problem: Severe recurrent major depression without psychotic features (HCC) Diagnosis: Principal Problem:   Severe recurrent major depression without psychotic features (HCC) Active Problems:   Paraplegia (HCC)   History of DVT (deep vein thrombosis)  Total Time spent with patient: 25 min  Past Psychiatric History: see H & P, no new info  Past Medical History:  Past Medical History:  Diagnosis Date  . Anxiety   . C6 spinal cord injury (HCC)   . Depression     Past Surgical History:  Procedure Laterality Date  . BACK SURGERY    . NECK SURGERY     Family History:  Family History  Problem Relation Age of Onset  . Alcohol abuse Father   . Alcohol abuse Brother   . Depression Mother   . Alcohol abuse Mother   . Parkinson's disease Mother    Family Psychiatric  History: see H & P, no new info Social History:  Social History   Substance and Sexual Activity  Alcohol Use Yes  . Alcohol/week: 0.0 standard drinks   Comment: Occasional use     Social History   Substance and Sexual Activity  Drug Use No    Social History    Socioeconomic History  . Marital status: Single    Spouse name: Not on file  . Number of children: Not on file  . Years of education: Not on file  . Highest education level: Not on file  Occupational History  . Occupation: Disability  Tobacco Use  . Smoking status: Current Every Day Smoker    Packs/day: 1.00    Types: Cigarettes  . Smokeless tobacco: Never Used  Vaping Use  . Vaping Use: Never used  Substance and Sexual Activity  . Alcohol use: Yes    Alcohol/week: 0.0 standard drinks    Comment: Occasional use  . Drug use: No  . Sexual activity: Not Currently  Other Topics Concern  . Not on file  Social History Narrative   Pt lives alone; receives outpatient psychiatry services through [facility].  Pt is on disability due to a spinal cord injury.    Social Determinants of Health   Financial Resource Strain:   . Difficulty of Paying Living Expenses: Not on file  Food Insecurity:   . Worried About Programme researcher, broadcasting/film/video in the Last Year: Not on file  . Ran Out of Food in the Last Year: Not on file  Transportation Needs:   . Lack of Transportation (Medical): Not on file  . Lack of Transportation (Non-Medical): Not on file  Physical Activity:   . Days of Exercise per  Week: Not on file  . Minutes of Exercise per Session: Not on file  Stress:   . Feeling of Stress : Not on file  Social Connections:   . Frequency of Communication with Friends and Family: Not on file  . Frequency of Social Gatherings with Friends and Family: Not on file  . Attends Religious Services: Not on file  . Active Member of Clubs or Organizations: Not on file  . Attends Banker Meetings: Not on file  . Marital Status: Not on file   Additional Social History:                         Sleep: Fair  Appetite:  Poor  Current Medications: Current Facility-Administered Medications  Medication Dose Route Frequency Provider Last Rate Last Admin  . acetaminophen  (TYLENOL) tablet 650 mg  650 mg Oral Q6H PRN Clapacs, Jackquline Denmark, MD   650 mg at 10/17/20 1818  . alum & mag hydroxide-simeth (MAALOX/MYLANTA) 200-200-20 MG/5ML suspension 30 mL  30 mL Oral Q4H PRN Clapacs, John T, MD      . baclofen (LIORESAL) tablet 10 mg  10 mg Oral TID Clapacs, Jackquline Denmark, MD   10 mg at 10/18/20 0826  . busPIRone (BUSPAR) tablet 10 mg  10 mg Oral TID Clapacs, Jackquline Denmark, MD   10 mg at 10/18/20 0826  . clonazePAM (KLONOPIN) tablet 0.5 mg  0.5 mg Oral Daily PRN Clapacs, John T, MD      . DULoxetine (CYMBALTA) DR capsule 120 mg  120 mg Oral Daily Clapacs, Jackquline Denmark, MD   120 mg at 10/18/20 0827  . hydrOXYzine (ATARAX/VISTARIL) tablet 25 mg  25 mg Oral Q6H PRN Clapacs, John T, MD      . loperamide (IMODIUM) capsule 2-4 mg  2-4 mg Oral PRN Clapacs, John T, MD      . magnesium hydroxide (MILK OF MAGNESIA) suspension 30 mL  30 mL Oral Daily PRN Clapacs, John T, MD      . multivitamin with minerals tablet 1 tablet  1 tablet Oral Daily Clapacs, Jackquline Denmark, MD   1 tablet at 10/18/20 0826  . nicotine (NICODERM CQ - dosed in mg/24 hours) patch 21 mg  21 mg Transdermal Daily Clapacs, John T, MD      . ondansetron (ZOFRAN-ODT) disintegrating tablet 4 mg  4 mg Oral Q6H PRN Clapacs, John T, MD      . QUEtiapine (SEROQUEL) tablet 100 mg  100 mg Oral QHS Clapacs, John T, MD   100 mg at 10/17/20 2116  . thiamine tablet 100 mg  100 mg Oral Daily Clapacs, Jackquline Denmark, MD   100 mg at 10/18/20 0827    Lab Results:  Results for orders placed or performed during the hospital encounter of 10/16/20 (from the past 48 hour(s))  Glucose, capillary     Status: None   Collection Time: 10/17/20  6:58 AM  Result Value Ref Range   Glucose-Capillary 97 70 - 99 mg/dL    Comment: Glucose reference range applies only to samples taken after fasting for at least 8 hours.    Blood Alcohol level:  Lab Results  Component Value Date   [facility] <10 10/16/2020   ETH <10 10/18/2017    Metabolic Disorder Labs: Lab Results  Component Value  Date   HGBA1C 4.6 (L) 10/16/2020   MPG 85.32 10/16/2020   MPG 105.41 05/26/2020   Lab Results  Component Value Date  PROLACTIN 121.9 (H) 02/03/2017   Lab Results  Component Value Date   CHOL 236 (H) 10/16/2020   TRIG 94 10/16/2020   HDL 78 10/16/2020   CHOLHDL 3.0 10/16/2020   VLDL 19 10/16/2020   LDLCALC 139 (H) 10/16/2020   LDLCALC 160 (H) 05/26/2020    Physical Findings: AIMS:  , ,  ,  ,    CIWA:    COWS:     Musculoskeletal: Strength : decreased Gait & Station: unsteady , uses WC Patient leans:   Psychiatric Specialty Exam: Physical Exam HENT:     Head: Normocephalic.     Mouth/Throat:     Mouth: Mucous membranes are moist.  Eyes:     Conjunctiva/sclera: Conjunctivae normal.  Pulmonary:     Effort: Pulmonary effort is normal.  Neurological:     Mental Status: She is alert and oriented to person, place, and time. Mental status is at baseline.  Psychiatric:        Judgment: Judgment normal.     Review of Systems  Blood pressure 123/76, pulse 78, temperature 98.6 F (37 C), resp. rate 19, height 5' (1.524 m), weight 65.8 kg, SpO2 96 %.Body mass index is 28.32 kg/m.  General Appearance: Well Groomed  Eye Contact:  Good  Speech:  Clear and Coherent and Normal Rate  Volume:  Normal  Mood:  Depressed  Affect:  Congruent  Thought Process:  Goal Directed  Orientation:  Full (Time, Place, and Person)  Thought Content:  Depressed, grief of death of her mother  Suicidal Thoughts:  Yes, denies intent, plan  Homicidal Thoughts:  No  Memory:  Negative  Judgement:  Fair  Insight:  Fair  Psychomotor Activity:  Decreased  Concentration:  fair  Recall:  Good  Fund of Knowledge:  Good  Language:  Good  Akathisia:  Negative  Handed:    AIMS (if indicated):     Assets:    ADL's:  Needs some help  Cognition:  WNL  Sleep:  Number of Hours: 8.25     Treatment Plan Summary: Daily contact with patient to assess and evaluate symptoms and progress in treatment  and Medication management  cymbalta for depression,  seroquel for mood,  buspar for anxietry Continue ECT 3x a week ,next on Monday.  Group/Miliu tx. Grief counseling.   Beverly Sessions, MD 10/18/2020, 10:55 AM

## 2020-10-18 NOTE — Anesthesia Postprocedure Evaluation (Signed)
Anesthesia Post Note  Patient: Natasha [name]  Procedure(s) Performed: ECT TX  Patient location during evaluation: PACU Anesthesia Type: General Level of consciousness: awake and alert Pain management: pain level controlled Vital Signs Assessment: post-procedure vital signs reviewed and stable Respiratory status: spontaneous breathing, nonlabored ventilation, respiratory function stable and patient connected to nasal cannula oxygen Cardiovascular status: blood pressure returned to baseline and stable Postop Assessment: no apparent nausea or vomiting Anesthetic complications: no   No complications documented.   Last Vitals:  Vitals:   10/17/20 1324 10/17/20 1330  BP: (!) 130/91 123/76  Pulse: 78   Resp: 17 19  Temp:  37 C  SpO2: 94% 96%    Last Pain:  Vitals:   10/17/20 1943  TempSrc:   PainSc: 0-No pain                 Cleda Mccreedy Leelynd Maldonado

## 2020-10-18 NOTE — Plan of Care (Signed)
Patient got up and performed hygiene  then went to the dayroom for breakfast. Pleasant on approach and denying thoughts of self harm. Alert and oriented. Received AM medications and had no concerns. Currently in room reading. Safety monitored as expected.

## 2020-10-19 DIAGNOSIS — F332 Major depressive disorder, recurrent severe without psychotic features: Secondary | ICD-10-CM | POA: Diagnosis not present

## 2020-10-19 MED ORDER — HYDROXYZINE HCL 25 MG PO TABS
25.0000 mg | ORAL_TABLET | Freq: Four times a day (QID) | ORAL | Status: DC | PRN
  Administered 2020-10-20: 25 mg via ORAL
  Filled 2020-10-19 (×2): qty 1

## 2020-10-19 NOTE — Progress Notes (Signed)
[facility] MD Progress Note  10/19/2020 1:12 PM Natasha [name]  MRN:  [id number] Subjective:  I'm ok today"  49 year old woman with chronic recurrent severe depression who is transferred to [place] from [facility].  Patient had presented there a couple days ago with a return of severe depression.  She reports that her depression has been back and bothering her at least since her mother passed away 6 months ago but things have escalated in the past couple weeks to months.  Pt needed prn clonazepam yesterday for anxiety, pt reports feeling better today, we discussed possible interference of clonazepam with ECT tx, pt agreeable to try vistaril for anxiety.  Pt  received first ECT on Friday, tolerated well.pt endorses sadness, intermittent passing SI, no intent and plan. Reports she had ECT in the past and that helped her and pt hopeful this will help her this time also. No evidence of psychosis,  Slept well.   Principal Problem: Severe recurrent major depression without psychotic features (HCC) Diagnosis: Principal Problem:   Severe recurrent major depression without psychotic features (HCC) Active Problems:   Paraplegia (HCC)   History of DVT (deep vein thrombosis)  Total Time spent with patient: 25 min  Past Psychiatric History: see H & P, no new info  Past Medical History:  Past Medical History:  Diagnosis Date  . Anxiety   . C6 spinal cord injury (HCC)   . Depression     Past Surgical History:  Procedure Laterality Date  . BACK SURGERY    . NECK SURGERY     Family History:  Family History  Problem Relation Age of Onset  . Alcohol abuse Father   . Alcohol abuse Brother   . Depression Mother   . Alcohol abuse Mother   . Parkinson's disease Mother    Family Psychiatric  History: see H & P, no new info Social History:  Social History   Substance and Sexual Activity  Alcohol Use Yes  . Alcohol/week: 0.0 standard drinks   Comment: Occasional use      Social History   Substance and Sexual Activity  Drug Use No    Social History   Socioeconomic History  . Marital status: Single    Spouse name: Not on file  . Number of children: Not on file  . Years of education: Not on file  . Highest education level: Not on file  Occupational History  . Occupation: Disability  Tobacco Use  . Smoking status: Current Every Day Smoker    Packs/day: 1.00    Types: Cigarettes  . Smokeless tobacco: Never Used  Vaping Use  . Vaping Use: Never used  Substance and Sexual Activity  . Alcohol use: Yes    Alcohol/week: 0.0 standard drinks    Comment: Occasional use  . Drug use: No  . Sexual activity: Not Currently  Other Topics Concern  . Not on file  Social History Narrative   Pt lives alone; receives outpatient psychiatry services through [facility].  Pt is on disability due to a spinal cord injury.    Social Determinants of Health   Financial Resource Strain:   . Difficulty of Paying Living Expenses: Not on file  Food Insecurity:   . Worried About Programme researcher, broadcasting/film/video in the Last Year: Not on file  . Ran Out of Food in the Last Year: Not on file  Transportation Needs:   . Lack of Transportation (Medical): Not on file  . Lack of Transportation (Non-Medical):  Not on file  Physical Activity:   . Days of Exercise per Week: Not on file  . Minutes of Exercise per Session: Not on file  Stress:   . Feeling of Stress : Not on file  Social Connections:   . Frequency of Communication with Friends and Family: Not on file  . Frequency of Social Gatherings with Friends and Family: Not on file  . Attends Religious Services: Not on file  . Active Member of Clubs or Organizations: Not on file  . Attends Banker Meetings: Not on file  . Marital Status: Not on file   Additional Social History:                         Sleep: Fair  Appetite:  Poor  Current Medications: Current Facility-Administered Medications   Medication Dose Route Frequency Provider Last Rate Last Admin  . acetaminophen (TYLENOL) tablet 650 mg  650 mg Oral Q6H PRN Clapacs, Jackquline Denmark, MD   650 mg at 10/17/20 1818  . alum & mag hydroxide-simeth (MAALOX/MYLANTA) 200-200-20 MG/5ML suspension 30 mL  30 mL Oral Q4H PRN Clapacs, John T, MD      . baclofen (LIORESAL) tablet 10 mg  10 mg Oral TID Clapacs, Jackquline Denmark, MD   10 mg at 10/19/20 1141  . busPIRone (BUSPAR) tablet 10 mg  10 mg Oral TID Clapacs, Jackquline Denmark, MD   10 mg at 10/19/20 1141  . DULoxetine (CYMBALTA) DR capsule 120 mg  120 mg Oral Daily Clapacs, John T, MD   120 mg at 10/19/20 1610  . hydrOXYzine (ATARAX/VISTARIL) tablet 25 mg  25 mg Oral Q6H PRN Beverly Sessions, MD      . magnesium hydroxide (MILK OF MAGNESIA) suspension 30 mL  30 mL Oral Daily PRN Clapacs, John T, MD      . multivitamin with minerals tablet 1 tablet  1 tablet Oral Daily Clapacs, Jackquline Denmark, MD   1 tablet at 10/19/20 0834  . nicotine (NICODERM CQ - dosed in mg/24 hours) patch 21 mg  21 mg Transdermal Daily Clapacs, Jackquline Denmark, MD   21 mg at 10/19/20 9604  . QUEtiapine (SEROQUEL) tablet 100 mg  100 mg Oral QHS Clapacs, John T, MD   100 mg at 10/18/20 2142  . thiamine tablet 100 mg  100 mg Oral Daily Clapacs, Jackquline Denmark, MD   100 mg at 10/19/20 5409    Lab Results:  No results found for this or any previous visit (from the past 48 hour(s)).  Blood Alcohol level:  Lab Results  Component Value Date   ETH <10 10/16/2020   ETH <10 10/18/2017    Metabolic Disorder Labs: Lab Results  Component Value Date   HGBA1C 4.6 (L) 10/16/2020   MPG 85.32 10/16/2020   MPG 105.41 05/26/2020   Lab Results  Component Value Date   PROLACTIN 121.9 (H) 02/03/2017   Lab Results  Component Value Date   CHOL 236 (H) 10/16/2020   TRIG 94 10/16/2020   HDL 78 10/16/2020   CHOLHDL 3.0 10/16/2020   VLDL 19 10/16/2020   LDLCALC 139 (H) 10/16/2020   LDLCALC 160 (H) 05/26/2020    Physical Findings: AIMS:  , ,  ,  ,    CIWA:    COWS:      Musculoskeletal: Strength : decreased Gait & Station: unsteady , uses WC Patient leans:   Psychiatric Specialty Exam: Physical Exam HENT:     Head: Normocephalic.  Mouth/Throat:     Mouth: Mucous membranes are moist.  Eyes:     Conjunctiva/sclera: Conjunctivae normal.  Pulmonary:     Effort: Pulmonary effort is normal.  Neurological:     Mental Status: She is alert and oriented to person, place, and time. Mental status is at baseline.  Psychiatric:        Judgment: Judgment normal.     Review of Systems  Blood pressure 111/80, pulse 84, temperature 97.8 F (36.6 C), temperature source Oral, resp. rate 18, height 5' (1.524 m), weight 65.8 kg, SpO2 (!) 84 %.Body mass index is 28.32 kg/m.  General Appearance: Well Groomed  Eye Contact:  Good  Speech:  Clear and Coherent and Normal Rate  Volume:  Normal  Mood:  Depressed, less anxious  Affect:  Congruent, anxious,   Thought Process:  Goal Directed  Orientation:  Full (Time, Place, and Person)  Thought Content:  Depressed, grief of death of her mother  Suicidal Thoughts:  Yes, denies intent, plan  Homicidal Thoughts:  No  Memory:  Negative  Judgement:  Fair  Insight:  Fair  Psychomotor Activity:  Decreased  Concentration:  fair  Recall:  Good  Fund of Knowledge:  Good  Language:  Good  Akathisia:  Negative  Handed:    AIMS (if indicated):     Assets:    ADL's:  Needs some help  Cognition:  WNL  Sleep:  Number of Hours: 7.25     Treatment Plan Summary: Daily contact with patient to assess and evaluate symptoms and progress in treatment and Medication management  Dc clonazepam due to possible interferance with ECT, and try vistaril for anxiety  cymbalta for depression,  seroquel for mood,  buspar for anxietry Continue ECT 3x a week ,next on Monday.  Group/Miliu tx. Grief counseling.   Beverly Sessions, MD 10/19/2020, 1:12 PMPatient ID: Natasha [name], female   DOB: Feb 08, 1971, 49 y.o.   MRN:  [id number]

## 2020-10-19 NOTE — Progress Notes (Signed)
Patient alert and oriented x 4, affect is blunted thoughts are organized and coherent, she currently denies SI/HI/AVH, she is visible in the milieu interacting appropriately with peers and staff. Patient rated depression a 5/10 on scale ( 0 low - 10 high) she was offered emotional support and encouragement to attend evening wrap up group. Patient did attend evening group. Patient was complaint with scheduled evening medication regimen, 15 minutes safety checks maintained will continue to monitor.

## 2020-10-19 NOTE — Progress Notes (Signed)
Pt is alert and oriented to person, place, time and situation. Pt is calm, cooperative, denies suicidal and homicidal ideation, denies hallucinations, reports feelings her feelings of anxiety and depression are improving. Pt rates them both 6/10 on a 0-10 scale, 10 being worst. Pt reports that is down from 10/10 on same scale. Pt is pleasant, medication complaint, spends time resting quietly in the dayroom, comes out of her room for lunch. Pt's affect is flat, brightens on contact. No distress noted, none reported. Will continue to monitor pt per Q15 minute face checks and monitor for safety and progress.

## 2020-10-19 NOTE — Plan of Care (Signed)
  Problem: Education: Goal: Knowledge of [place] [facility] Education information/materials will improve Outcome: Progressing Goal: Emotional status will improve Outcome: Progressing Goal: Mental status will improve Outcome: Progressing Goal: Verbalization of understanding the information provided will improve Outcome: Progressing   Problem: Health Behavior/Discharge Planning: Goal: Identification of resources available to assist in meeting health care needs will improve Outcome: Progressing Goal: Compliance with treatment plan for underlying cause of condition will improve Outcome: Progressing   Problem: Education: Goal: Utilization of techniques to improve thought processes will improve Outcome: Progressing   Problem: Coping: Goal: Coping ability will improve Outcome: Progressing Goal: Will verbalize feelings Outcome: Progressing   Problem: Role Relationship: Goal: Will demonstrate positive changes in social behaviors and relationships Outcome: Progressing   Problem: Safety: Goal: Ability to disclose and discuss suicidal ideas will improve Outcome: Progressing Goal: Ability to identify and utilize support systems that promote safety will improve Outcome: Progressing

## 2020-10-19 NOTE — [facility] Group Notes (Signed)
[facility] Group Notes: (Clinical Social Work)  ° °10/19/2020    ° ° °Type of Therapy:  Group Therapy  ° °Participation Level:  Did Not Attend - was invited individually by Nurse/MHT and chose not to attend. ° ° °Teria Khachatryan, LCSWA °10/19/2020  3:01 PM   °  ° °

## 2020-10-20 ENCOUNTER — Inpatient Hospital Stay: Payer: Medicare Other | Admitting: Anesthesiology

## 2020-10-20 ENCOUNTER — Encounter: Payer: Self-pay | Admitting: Psychiatry

## 2020-10-20 LAB — GLUCOSE, CAPILLARY: Glucose-Capillary: 95 mg/dL (ref 70–99)

## 2020-10-20 MED ORDER — SUCCINYLCHOLINE CHLORIDE 200 MG/10ML IV SOSY
PREFILLED_SYRINGE | INTRAVENOUS | Status: AC
  Filled 2020-10-20: qty 10

## 2020-10-20 MED ORDER — SODIUM CHLORIDE 0.9 % IV SOLN: INTRAVENOUS | Status: DC | PRN

## 2020-10-20 MED ORDER — GLYCOPYRROLATE 0.2 MG/ML IJ SOLN
0.1000 mg | Freq: Once | INTRAMUSCULAR | Status: AC
  Administered 2020-10-20: 0.1 mg via INTRAVENOUS

## 2020-10-20 MED ORDER — METHOHEXITAL SODIUM 100 MG/10ML IV SOSY
PREFILLED_SYRINGE | INTRAVENOUS | Status: DC | PRN
  Administered 2020-10-20: 60 mg via INTRAVENOUS

## 2020-10-20 MED ORDER — ESMOLOL HCL 100 MG/10ML IV SOLN
INTRAVENOUS | Status: DC | PRN
  Administered 2020-10-20: 20 mg via INTRAVENOUS
  Administered 2020-10-20 (×2): 10 mg via INTRAVENOUS

## 2020-10-20 MED ORDER — SUCCINYLCHOLINE CHLORIDE 20 MG/ML IJ SOLN
INTRAMUSCULAR | Status: DC | PRN
  Administered 2020-10-20: 80 mg via INTRAVENOUS

## 2020-10-20 MED ORDER — GLYCOPYRROLATE 0.2 MG/ML IJ SOLN
INTRAMUSCULAR | Status: AC
  Filled 2020-10-20: qty 1

## 2020-10-20 MED ORDER — KETOROLAC TROMETHAMINE 30 MG/ML IJ SOLN
30.0000 mg | Freq: Once | INTRAMUSCULAR | Status: AC
  Administered 2020-10-20: 30 mg via INTRAVENOUS

## 2020-10-20 MED ORDER — METHOHEXITAL SODIUM 0.5 G IJ SOLR
INTRAMUSCULAR | Status: AC
  Filled 2020-10-20: qty 500

## 2020-10-20 MED ORDER — ALBUTEROL SULFATE HFA 108 (90 BASE) MCG/ACT IN AERS
INHALATION_SPRAY | RESPIRATORY_TRACT | Status: AC
  Filled 2020-10-20: qty 6.7

## 2020-10-20 MED ORDER — SODIUM CHLORIDE 0.9 % IV SOLN
500.0000 mL | Freq: Once | INTRAVENOUS | Status: AC
  Administered 2020-10-20: 500 mL via INTRAVENOUS

## 2020-10-20 MED ORDER — KETOROLAC TROMETHAMINE 30 MG/ML IJ SOLN
INTRAMUSCULAR | Status: AC
  Filled 2020-10-20: qty 1

## 2020-10-20 NOTE — Progress Notes (Signed)
Patient is alert and oriented x 4. She is pleasant and easy to engage despite been isolative to her room this evening. She denies SI  HI AVH but does endorse having some anxiety and depression to which she reports "getting better" She is med compliant and tolerated her meds without incident. She is safe on the unit with 15 minute safety checks and encouraged to contact staff with any concerns.      Cleo Butler-Nicholson, LPN

## 2020-10-20 NOTE — Procedures (Signed)
ECT SERVICES Physician's Interval Evaluation & Treatment Note  Patient Identification: Natasha [name] MRN:  [id number] Date of Evaluation:  10/20/2020 TX #: 2  MADRS:   MMSE:   P.E. Findings:  Quadriplegic no change from baseline.  Psychiatric Interval Note:  Reports mood is feeling better  Subjective:  Patient is a 49 y.o. female seen for evaluation for Electroconvulsive Therapy. Already a little less depressed  Treatment Summary:   [x]   Right Unilateral             []  Bilateral   % Energy : 0.3 ms 55%   Impedance: 1780 ohms  Seizure Energy Index: 12,647 V squared  Postictal Suppression Index: 90%  Seizure Concordance Index: 77%  Medications  Pre Shock: Brevital 60 mg succinylcholine 80 mg  Post Shock:    Seizure Duration: 50 seconds EMG 73 seconds EEG   Comments: Well-tolerated.  Next treatment Wednesday  Lungs:  [x]   Clear to auscultation               []  Other:   Heart:    [x]   Regular rhythm             []  irregular rhythm    [x]   Previous H&P reviewed, patient examined and there are NO CHANGES                 []   Previous H&P reviewed, patient examined and there are changes noted.   , MD 11/8/20216:34 PM

## 2020-10-20 NOTE — [facility] Group Notes (Signed)
LCSW Group Therapy Note   10/20/2020 2:14 PM  Type of Therapy and Topic:  Group Therapy:  Overcoming Obstacles   Participation Level:  Did Not Attend   Description of Group:    In this group patients will be encouraged to explore what they see as obstacles to their own wellness and recovery. They will be guided to discuss their thoughts, feelings, and behaviors related to these obstacles. The group will process together ways to cope with barriers, with attention given to specific choices patients can make. Each patient will be challenged to identify changes they are motivated to make in order to overcome their obstacles. This group will be process-oriented, with patients participating in exploration of their own experiences as well as giving and receiving support and challenge from other group members.   Therapeutic Goals: 1. Patient will identify personal and current obstacles as they relate to admission. 2. Patient will identify barriers that currently interfere with their wellness or overcoming obstacles.  3. Patient will identify feelings, thought process and behaviors related to these barriers. 4. Patient will identify two changes they are willing to make to overcome these obstacles:      Summary of Patient Progress X    Therapeutic Modalities:   Cognitive Behavioral Therapy Solution Focused Therapy Motivational Interviewing Relapse Prevention Therapy  Simona Huh R. Algis Greenhouse, MSW, LCSW, LCAS 10/20/2020 2:14 PM

## 2020-10-20 NOTE — Transfer of Care (Signed)
Immediate Anesthesia Transfer of Care Note  Patient: Natasha [name]  Procedure(s) Performed: ECT TX  Patient Location: PACU  Anesthesia Type:General  Level of Consciousness: drowsy  Airway & Oxygen Therapy: Patient Spontanous Breathing and Patient connected to nasal cannula oxygen  Post-op Assessment: Report given to RN  Post vital signs: stable  Last Vitals:  Vitals Value Taken Time  BP    Temp 36.7 C 10/20/20 1415  Pulse    Resp 22 10/20/20 1416  SpO2    Vitals shown include unvalidated device data.  Last Pain:  Vitals:   10/20/20 1104  TempSrc:   PainSc: 0-No pain      Patients Stated Pain Goal: 0 (10/20/20 0845)  Complications: No complications documented.

## 2020-10-20 NOTE — Progress Notes (Signed)
D: Pt alert and oriented. Pt rates depression 6/10 and anxiety 6/10. Pt reports experiencing 3/10 chronic back pain r/t spinal cord injury, however states that she doesn't want anything for it at this time (in which pt is NPO and unable to have anything presently). Pt denies experiencing any SI/HI, or AVH at this time.   A: Scheduled medications administered to pt, per MD orders. Support and encouragement provided. Frequent verbal contact made. Routine safety checks conducted q15 minutes.   R: No adverse drug reactions noted. Pt verbally contracts for safety at this time. Pt complaint with medications and treatment plan. Pt interacts well with others on the unit. Pt remains safe at this time. Will continue to monitor.

## 2020-10-20 NOTE — H&P (Signed)
Natasha [name] is an 49 y.o. female.   Chief Complaint: improved mood HPI: recurrent severe depression  Past Medical History:  Diagnosis Date  . Anxiety   . C6 spinal cord injury (HCC)   . Depression     Past Surgical History:  Procedure Laterality Date  . BACK SURGERY    . NECK SURGERY      Family History  Problem Relation Age of Onset  . Alcohol abuse Father   . Alcohol abuse Brother   . Depression Mother   . Alcohol abuse Mother   . Parkinson's disease Mother    Social History:  reports that she has been smoking cigarettes. She has been smoking about 1.00 pack per day. She has never used smokeless tobacco. She reports current alcohol use. She reports that she does not use drugs.  Allergies: No Known Allergies  Medications Prior to Admission  Medication Sig Dispense Refill  . baclofen (LIORESAL) 10 MG tablet Take 10 mg by mouth 3 (three) times daily.    . busPIRone (BUSPAR) 10 MG tablet Take 1 tablet (10 mg total) by mouth 3 (three) times daily. For anxiety 45 tablet 1  . clonazePAM (KLONOPIN) 0.5 MG tablet Take 0.5 mg by mouth daily as needed for anxiety.    . DULoxetine (CYMBALTA) 60 MG capsule Take 2 capsules (120 mg total) by mouth daily. 60 capsule 0  . meloxicam (MOBIC) 15 MG tablet Take 15 mg by mouth daily.    . QUEtiapine (SEROQUEL) 100 MG tablet Take 1 tablet (100 mg total) by mouth at bedtime. 30 tablet 0    Results for orders placed or performed during the hospital encounter of 10/16/20 (from the past 48 hour(s))  Glucose, capillary     Status: None   Collection Time: 10/20/20  6:52 AM  Result Value Ref Range   Glucose-Capillary 95 70 - 99 mg/dL    Comment: Glucose reference range applies only to samples taken after fasting for at least 8 hours.   Comment 1 Notify RN    No results found.  Review of Systems  Constitutional: Negative.   HENT: Negative.   Eyes: Negative.   Respiratory: Negative.   Cardiovascular: Negative.   Gastrointestinal:  Negative.   Musculoskeletal: Negative.   Skin: Negative.   Neurological: Negative.   Psychiatric/Behavioral: Positive for dysphoric mood. Negative for suicidal ideas.    Blood pressure (!) 150/89, pulse 68, temperature 98.4 F (36.9 C), temperature source Oral, resp. rate 18, height 5' (1.524 m), weight 65.8 kg, SpO2 92 %. Physical Exam Vitals and nursing note reviewed.  Constitutional:      Appearance: She is well-developed.  HENT:     Head: Normocephalic and atraumatic.  Eyes:     Conjunctiva/sclera: Conjunctivae normal.     Pupils: Pupils are equal, round, and reactive to light.  Cardiovascular:     Heart sounds: Normal heart sounds.  Pulmonary:     Effort: Pulmonary effort is normal.  Abdominal:     Palpations: Abdomen is soft.  Musculoskeletal:        General: Normal range of motion.     Cervical back: Normal range of motion.  Skin:    General: Skin is warm and dry.  Neurological:     Mental Status: She is alert.     Comments: Quadriplegic  Psychiatric:        Attention and Perception: Attention normal.        Mood and Affect: Mood normal.  Speech: Speech normal.        Behavior: Behavior is cooperative.        Thought Content: Thought content normal.        Cognition and Memory: Cognition normal.        Judgment: Judgment normal.      Assessment/Plan Continue index treatment today and Wednesday  Mordecai Rasmussen, MD 10/20/2020, 12:13 PM

## 2020-10-20 NOTE — Plan of Care (Signed)
  Problem: Education: Goal: Knowledge of [facility] Education information/materials will improve Outcome: Progressing Goal: Emotional status will improve Outcome: Progressing Goal: Mental status will improve Outcome: Progressing Goal: Verbalization of understanding the information provided will improve Outcome: Progressing   Problem: Health Behavior/Discharge Planning: Goal: Identification of resources available to assist in meeting health care needs will improve Outcome: Progressing Goal: Compliance with treatment plan for underlying cause of condition will improve Outcome: Progressing   Problem: Education: Goal: Utilization of techniques to improve thought processes will improve Outcome: Progressing   Problem: Coping: Goal: Coping ability will improve Outcome: Progressing Goal: Will verbalize feelings Outcome: Progressing   Problem: Role Relationship: Goal: Will demonstrate positive changes in social behaviors and relationships Outcome: Progressing   Problem: Safety: Goal: Ability to disclose and discuss suicidal ideas will improve Outcome: Progressing Goal: Ability to identify and utilize support systems that promote safety will improve Outcome: Progressing   

## 2020-10-20 NOTE — Progress Notes (Signed)
[facility] MD Progress Note  10/20/2020 6:30 PM Natasha [name]  MRN:  [id number] Subjective: Follow-up for this patient with severe recurrent depression.  Patient tells me she is feeling better after just 1 ECT treatment.  Mood still depressed still some passive suicidal ideation but a little more optimistic.  No new physical complaints.  Chronic pain. Principal Problem: Severe recurrent major depression without psychotic features (HCC) Diagnosis: Principal Problem:   Severe recurrent major depression without psychotic features (HCC) Active Problems:   Paraplegia (HCC)   History of DVT (deep vein thrombosis)  Total Time spent with patient: 30 minutes  Past Psychiatric History: Long history of severe recurrent depression and suicide attempts.  Previous good response to ECT  Past Medical History:  Past Medical History:  Diagnosis Date  . Anxiety   . C6 spinal cord injury (HCC)   . Depression     Past Surgical History:  Procedure Laterality Date  . BACK SURGERY    . NECK SURGERY     Family History:  Family History  Problem Relation Age of Onset  . Alcohol abuse Father   . Alcohol abuse Brother   . Depression Mother   . Alcohol abuse Mother   . Parkinson's disease Mother    Family Psychiatric  History: See previous Social History:  Social History   Substance and Sexual Activity  Alcohol Use Yes  . Alcohol/week: 0.0 standard drinks   Comment: Occasional use     Social History   Substance and Sexual Activity  Drug Use No    Social History   Socioeconomic History  . Marital status: Single    Spouse name: Not on file  . Number of children: Not on file  . Years of education: Not on file  . Highest education level: Not on file  Occupational History  . Occupation: Disability  Tobacco Use  . Smoking status: Current Every Day Smoker    Packs/day: 1.00    Types: Cigarettes  . Smokeless tobacco: Never Used  Vaping Use  . Vaping Use: Never used  Substance and  Sexual Activity  . Alcohol use: Yes    Alcohol/week: 0.0 standard drinks    Comment: Occasional use  . Drug use: No  . Sexual activity: Not Currently  Other Topics Concern  . Not on file  Social History Narrative   Pt lives alone; receives outpatient psychiatry services through [facility].  Pt is on disability due to a spinal cord injury.    Social Determinants of Health   Financial Resource Strain:   . Difficulty of Paying Living Expenses: Not on file  Food Insecurity:   . Worried About Programme researcher, broadcasting/film/video in the Last Year: Not on file  . Ran Out of Food in the Last Year: Not on file  Transportation Needs:   . Lack of Transportation (Medical): Not on file  . Lack of Transportation (Non-Medical): Not on file  Physical Activity:   . Days of Exercise per Week: Not on file  . Minutes of Exercise per Session: Not on file  Stress:   . Feeling of Stress : Not on file  Social Connections:   . Frequency of Communication with Friends and Family: Not on file  . Frequency of Social Gatherings with Friends and Family: Not on file  . Attends Religious Services: Not on file  . Active Member of Clubs or Organizations: Not on file  . Attends Banker Meetings: Not on file  . Marital Status: Not  on file   Additional Social History:                         Sleep: Fair  Appetite:  Fair  Current Medications: Current Facility-Administered Medications  Medication Dose Route Frequency Provider Last Rate Last Admin  . acetaminophen (TYLENOL) tablet 650 mg  650 mg Oral Q6H PRN Rinda Rollyson, Jackquline Denmark, MD   650 mg at 10/20/20 1644  . alum & mag hydroxide-simeth (MAALOX/MYLANTA) 200-200-20 MG/5ML suspension 30 mL  30 mL Oral Q4H PRN Samiyyah Moffa T, MD      . baclofen (LIORESAL) tablet 10 mg  10 mg Oral TID Purity Irmen, Jackquline Denmark, MD   10 mg at 10/20/20 1644  . busPIRone (BUSPAR) tablet 10 mg  10 mg Oral TID Shaniqwa Horsman, Jackquline Denmark, MD   10 mg at 10/20/20 1644  . DULoxetine (CYMBALTA) DR  capsule 120 mg  120 mg Oral Daily Ayomide Zuleta, Jackquline Denmark, MD   120 mg at 10/20/20 1644  . glycopyrrolate (ROBINUL) 0.2 MG/ML injection           . hydrOXYzine (ATARAX/VISTARIL) tablet 25 mg  25 mg Oral Q6H PRN Beverly Sessions, MD      . ketorolac (TORADOL) 30 MG/ML injection           . magnesium hydroxide (MILK OF MAGNESIA) suspension 30 mL  30 mL Oral Daily PRN Kohler Pellerito T, MD      . multivitamin with minerals tablet 1 tablet  1 tablet Oral Daily Fahmida Jurich, Jackquline Denmark, MD   1 tablet at 10/20/20 1644  . QUEtiapine (SEROQUEL) tablet 100 mg  100 mg Oral QHS Buford Bremer T, MD   100 mg at 10/19/20 2125  . thiamine tablet 100 mg  100 mg Oral Daily Brittanie Dosanjh, Jackquline Denmark, MD   100 mg at 10/20/20 1644    Lab Results:  Results for orders placed or performed during the hospital encounter of 10/16/20 (from the past 48 hour(s))  Glucose, capillary     Status: None   Collection Time: 10/20/20  6:52 AM  Result Value Ref Range   Glucose-Capillary 95 70 - 99 mg/dL    Comment: Glucose reference range applies only to samples taken after fasting for at least 8 hours.   Comment 1 Notify RN     Blood Alcohol level:  Lab Results  Component Value Date   ETH <10 10/16/2020   ETH <10 10/18/2017    Metabolic Disorder Labs: Lab Results  Component Value Date   HGBA1C 4.6 (L) 10/16/2020   MPG 85.32 10/16/2020   MPG 105.41 05/26/2020   Lab Results  Component Value Date   PROLACTIN 121.9 (H) 02/03/2017   Lab Results  Component Value Date   CHOL 236 (H) 10/16/2020   TRIG 94 10/16/2020   HDL 78 10/16/2020   CHOLHDL 3.0 10/16/2020   VLDL 19 10/16/2020   LDLCALC 139 (H) 10/16/2020   LDLCALC 160 (H) 05/26/2020    Physical Findings: AIMS:  , ,  ,  ,    CIWA:    COWS:     Musculoskeletal: Strength & Muscle Tone: within normal limits Gait & Station: unable to stand Patient leans: N/A  Psychiatric Specialty Exam: Physical Exam Vitals and nursing note reviewed.  Constitutional:      Appearance: She is  well-developed.  HENT:     Head: Normocephalic and atraumatic.  Eyes:     Conjunctiva/sclera: Conjunctivae normal.     Pupils: Pupils are  equal, round, and reactive to light.  Cardiovascular:     Heart sounds: Normal heart sounds.  Pulmonary:     Effort: Pulmonary effort is normal.  Abdominal:     Palpations: Abdomen is soft.  Musculoskeletal:        General: Normal range of motion.     Cervical back: Normal range of motion.  Skin:    General: Skin is warm and dry.  Neurological:     Mental Status: She is alert.     Comments: QuadriplegicNo.  No change from previous  Psychiatric:        Attention and Perception: Attention normal.        Mood and Affect: Mood is depressed.        Speech: Speech normal.        Behavior: Behavior normal.        Thought Content: Thought content normal.        Cognition and Memory: Cognition normal.        Judgment: Judgment normal.     Review of Systems  Constitutional: Negative.   HENT: Negative.   Eyes: Negative.   Respiratory: Negative.   Cardiovascular: Negative.   Gastrointestinal: Negative.   Musculoskeletal: Negative.   Skin: Negative.   Neurological: Negative.   Psychiatric/Behavioral: Positive for dysphoric mood and suicidal ideas.    Blood pressure (!) 143/96, pulse 88, temperature 98.3 F (36.8 C), temperature source Oral, resp. rate 16, height 5' (1.524 m), weight 65.8 kg, SpO2 100 %.Body mass index is 28.32 kg/m.  General Appearance: Casual  Eye Contact:  Fair  Speech:  Normal Rate  Volume:  Normal  Mood:  Dysphoric  Affect:  Congruent  Thought Process:  Coherent  Orientation:  Full (Time, Place, and Person)  Thought Content:  Logical  Suicidal Thoughts:  Yes.  without intent/plan  Homicidal Thoughts:  No  Memory:  Immediate;   Fair Recent;   Fair Remote;   Fair  Judgement:  Fair  Insight:  Fair  Psychomotor Activity:  Normal  Concentration:  Concentration: Fair  Recall:  [employer] of Knowledge:  Fair   Language:  Fair  Akathisia:  No  Handed:  Right  AIMS (if indicated):     Assets:  Desire for Improvement Housing Resilience  ADL's:  Impaired  Cognition:  WNL  Sleep:  Number of Hours: 8.5     Treatment Plan Summary: Daily contact with patient to assess and evaluate symptoms and progress in treatment, Medication management and Plan No change to medication management.  Second ECT treatment in this series today.  Good seizure well-tolerated.  We will follow-up over the next couple days.  Might be ready for discharge by Wednesday  Mordecai Rasmussen, MD 10/20/2020, 6:30 PM

## 2020-10-20 NOTE — Anesthesia Preprocedure Evaluation (Signed)
Anesthesia Evaluation  Patient identified by MRN, date of birth, ID band Patient awake    Reviewed: Allergy & Precautions, NPO status , Patient's Chart, lab work & pertinent test results  History of Anesthesia Complications Negative for: history of anesthetic complications  Airway Mallampati: II  TM Distance: >3 FB Neck ROM: Full    Dental  (+) Poor Dentition   Pulmonary neg sleep apnea, neg COPD, Current Smoker and Patient abstained from smoking.,    breath sounds clear to auscultation- rhonchi (-) wheezing      Cardiovascular Exercise Tolerance: Good (-) hypertension(-) CAD, (-) Past MI, (-) Cardiac Stents and (-) CABG  Rhythm:Regular Rate:Normal - Systolic murmurs and - Diastolic murmurs    Neuro/Psych neg Seizures PSYCHIATRIC DISORDERS Anxiety Depression negative neurological ROS     GI/Hepatic negative GI ROS, Neg liver ROS,   Endo/Other  negative endocrine ROSneg diabetes  Renal/GU negative Renal ROS     Musculoskeletal negative musculoskeletal ROS (+)   Abdominal (+) - obese,   Peds  Hematology negative hematology ROS (+)   Anesthesia Other Findings Past Medical History: No date: Anxiety No date: C6 spinal cord injury (HCC) No date: Depression   Reproductive/Obstetrics                             Anesthesia Physical Anesthesia Plan  ASA: II  Anesthesia Plan: General   Post-op Pain Management:    Induction: Intravenous  PONV Risk Score and Plan: 1 and Ondansetron  Airway Management Planned: Mask  Additional Equipment:   Intra-op Plan:   Post-operative Plan:   Informed Consent: I have reviewed the patients History and Physical, chart, labs and discussed the procedure including the risks, benefits and alternatives for the proposed anesthesia with the patient or authorized representative who has indicated his/her understanding and acceptance.     Dental advisory  given  Plan Discussed with: CRNA and Anesthesiologist  Anesthesia Plan Comments:         Anesthesia Quick Evaluation

## 2020-10-21 ENCOUNTER — Other Ambulatory Visit: Payer: Self-pay | Admitting: Psychiatry

## 2020-10-21 LAB — POCT PREGNANCY, URINE: Preg Test, Ur: NEGATIVE

## 2020-10-21 NOTE — Progress Notes (Signed)
[facility] MD Progress Note  10/21/2020 5:36 PM Natasha [name]  MRN:  [id number] Subjective: Follow-up for patient with severe major depression admitted for electroconvulsive therapy.  Patient seen chart reviewed.  Patient reports that she is feeling much better.  Mood is back to normal baseline.  Not feeling sad or hopeless.  Smiling easily.  No suicidal ideation.  Not complaining of any side effects or memory problems.  Patient is requesting that we consider discharge tomorrow. Principal Problem: Severe recurrent major depression without psychotic features (HCC) Diagnosis: Principal Problem:   Severe recurrent major depression without psychotic features (HCC) Active Problems:   Paraplegia (HCC)   History of DVT (deep vein thrombosis)  Total Time spent with patient: 30 minutes  Past Psychiatric History: Long history of severe recurrent depression.  Good response to electroconvulsive therapy.  Past Medical History:  Past Medical History:  Diagnosis Date  . Anxiety   . C6 spinal cord injury (HCC)   . Depression     Past Surgical History:  Procedure Laterality Date  . BACK SURGERY    . NECK SURGERY     Family History:  Family History  Problem Relation Age of Onset  . Alcohol abuse Father   . Alcohol abuse Brother   . Depression Mother   . Alcohol abuse Mother   . Parkinson's disease Mother    Family Psychiatric  History: See previous Social History:  Social History   Substance and Sexual Activity  Alcohol Use Yes  . Alcohol/week: 0.0 standard drinks   Comment: Occasional use     Social History   Substance and Sexual Activity  Drug Use No    Social History   Socioeconomic History  . Marital status: Single    Spouse name: Not on file  . Number of children: Not on file  . Years of education: Not on file  . Highest education level: Not on file  Occupational History  . Occupation: Disability  Tobacco Use  . Smoking status: Current Every Day Smoker     Packs/day: 1.00    Types: Cigarettes  . Smokeless tobacco: Never Used  Vaping Use  . Vaping Use: Never used  Substance and Sexual Activity  . Alcohol use: Yes    Alcohol/week: 0.0 standard drinks    Comment: Occasional use  . Drug use: No  . Sexual activity: Not Currently  Other Topics Concern  . Not on file  Social History Narrative   Pt lives alone; receives outpatient psychiatry services through [facility].  Pt is on disability due to a spinal cord injury.    Social Determinants of Health   Financial Resource Strain:   . Difficulty of Paying Living Expenses: Not on file  Food Insecurity:   . Worried About Programme researcher, broadcasting/film/video in the Last Year: Not on file  . Ran Out of Food in the Last Year: Not on file  Transportation Needs:   . Lack of Transportation (Medical): Not on file  . Lack of Transportation (Non-Medical): Not on file  Physical Activity:   . Days of Exercise per Week: Not on file  . Minutes of Exercise per Session: Not on file  Stress:   . Feeling of Stress : Not on file  Social Connections:   . Frequency of Communication with Friends and Family: Not on file  . Frequency of Social Gatherings with Friends and Family: Not on file  . Attends Religious Services: Not on file  . Active Member of Clubs or Organizations:  Not on file  . Attends Banker Meetings: Not on file  . Marital Status: Not on file   Additional Social History:                         Sleep: Fair  Appetite:  Fair  Current Medications: Current Facility-Administered Medications  Medication Dose Route Frequency Provider Last Rate Last Admin  . acetaminophen (TYLENOL) tablet 650 mg  650 mg Oral Q6H PRN Braeden Kennan, Jackquline Denmark, MD   650 mg at 10/21/20 0806  . alum & mag hydroxide-simeth (MAALOX/MYLANTA) 200-200-20 MG/5ML suspension 30 mL  30 mL Oral Q4H PRN Nigil Braman T, MD      . baclofen (LIORESAL) tablet 10 mg  10 mg Oral TID Jayquan Bradsher, Jackquline Denmark, MD   10 mg at 10/21/20 1656   . busPIRone (BUSPAR) tablet 10 mg  10 mg Oral TID Norva Bowe, Jackquline Denmark, MD   10 mg at 10/21/20 1656  . DULoxetine (CYMBALTA) DR capsule 120 mg  120 mg Oral Daily Manreet Kiernan, Jackquline Denmark, MD   120 mg at 10/21/20 0804  . hydrOXYzine (ATARAX/VISTARIL) tablet 25 mg  25 mg Oral Q6H PRN Beverly Sessions, MD   25 mg at 10/20/20 1945  . magnesium hydroxide (MILK OF MAGNESIA) suspension 30 mL  30 mL Oral Daily PRN Jarely Juncaj T, MD      . multivitamin with minerals tablet 1 tablet  1 tablet Oral Daily Evyn Putzier, Jackquline Denmark, MD   1 tablet at 10/21/20 0804  . QUEtiapine (SEROQUEL) tablet 100 mg  100 mg Oral QHS Kristyna Bradstreet, Jackquline Denmark, MD   100 mg at 10/20/20 2108  . thiamine tablet 100 mg  100 mg Oral Daily Asjia Berrios, Jackquline Denmark, MD   100 mg at 10/21/20 6433    Lab Results:  Results for orders placed or performed during the hospital encounter of 10/16/20 (from the past 48 hour(s))  Glucose, capillary     Status: None   Collection Time: 10/20/20  6:52 AM  Result Value Ref Range   Glucose-Capillary 95 70 - 99 mg/dL    Comment: Glucose reference range applies only to samples taken after fasting for at least 8 hours.   Comment 1 Notify RN   Pregnancy, urine POC     Status: None   Collection Time: 10/20/20  5:20 PM  Result Value Ref Range   Preg Test, Ur NEGATIVE NEGATIVE    Comment:        THE SENSITIVITY OF THIS METHODOLOGY IS >24 mIU/mL     Blood Alcohol level:  Lab Results  Component Value Date   ETH <10 10/16/2020   ETH <10 10/18/2017    Metabolic Disorder Labs: Lab Results  Component Value Date   HGBA1C 4.6 (L) 10/16/2020   MPG 85.32 10/16/2020   MPG 105.41 05/26/2020   Lab Results  Component Value Date   PROLACTIN 121.9 (H) 02/03/2017   Lab Results  Component Value Date   CHOL 236 (H) 10/16/2020   TRIG 94 10/16/2020   HDL 78 10/16/2020   CHOLHDL 3.0 10/16/2020   VLDL 19 10/16/2020   LDLCALC 139 (H) 10/16/2020   LDLCALC 160 (H) 05/26/2020    Physical Findings: AIMS:  , ,  ,  ,    CIWA:    COWS:      Musculoskeletal: Strength & Muscle Tone: decreased Gait & Station: unable to stand Patient leans: N/A  Psychiatric Specialty Exam: Physical Exam Vitals and nursing note reviewed.  Constitutional:      Appearance: She is well-developed.  HENT:     Head: Normocephalic and atraumatic.  Eyes:     Conjunctiva/sclera: Conjunctivae normal.     Pupils: Pupils are equal, round, and reactive to light.  Cardiovascular:     Heart sounds: Normal heart sounds.  Pulmonary:     Effort: Pulmonary effort is normal.  Abdominal:     Palpations: Abdomen is soft.  Musculoskeletal:        General: Normal range of motion.     Cervical back: Normal range of motion.  Skin:    General: Skin is warm and dry.  Neurological:     Mental Status: She is alert.     Comments:  Quadriplegic with retained function in upper extremities centrally.  No change from baseline.  Psychiatric:        Attention and Perception: Attention normal.        Mood and Affect: Mood normal.        Speech: Speech normal.        Behavior: Behavior normal.        Thought Content: Thought content normal.        Cognition and Memory: Cognition normal.        Judgment: Judgment normal.     Review of Systems  Constitutional: Negative.   HENT: Negative.   Eyes: Negative.   Respiratory: Negative.   Cardiovascular: Negative.   Gastrointestinal: Negative.   Musculoskeletal: Negative.   Skin: Negative.   Neurological: Negative.   Psychiatric/Behavioral: Negative.     Blood pressure (!) 119/92, pulse 93, temperature 99 F (37.2 C), temperature source Oral, resp. rate 14, height 5' (1.524 m), weight 65.8 kg, SpO2 100 %.Body mass index is 28.32 kg/m.  General Appearance: Casual  Eye Contact:  Good  Speech:  Clear and Coherent  Volume:  Normal  Mood:  Euthymic  Affect:  Congruent  Thought Process:  Goal Directed  Orientation:  Full (Time, Place, and Person)  Thought Content:  Logical  Suicidal Thoughts:  No  Homicidal  Thoughts:  No  Memory:  Immediate;   Fair Recent;   Fair Remote;   Fair  Judgement:  Fair  Insight:  Fair  Psychomotor Activity:  Normal  Concentration:  Concentration: Fair  Recall:  [employer] of Knowledge:  Fair  Language:  Fair  Akathisia:  No  Handed:  Right  AIMS (if indicated):     Assets:  Desire for Improvement  ADL's:  Intact  Cognition:  WNL  Sleep:  Number of Hours: 8.75     Treatment Plan Summary: Plan Patient has shown great improvement as expected given her previous quick response to ECT.  We are in agreement that we will have treatment tomorrow morning after which we will plan for likely discharge in the afternoon.  Reviewed treatment plan and medicine with patient.  Supportive counseling.  Orders are in place for tomorrow morning's electroconvulsive treatment.  Mordecai Rasmussen, MD 10/21/2020, 5:36 PM

## 2020-10-21 NOTE — Anesthesia Postprocedure Evaluation (Signed)
Anesthesia Post Note  Patient: Natasha [name]  Procedure(s) Performed: ECT TX  Patient location during evaluation: PACU Anesthesia Type: General Level of consciousness: awake and alert Pain management: pain level controlled Vital Signs Assessment: post-procedure vital signs reviewed and stable Respiratory status: spontaneous breathing, nonlabored ventilation, respiratory function stable and patient connected to nasal cannula oxygen Cardiovascular status: blood pressure returned to baseline and stable Postop Assessment: no apparent nausea or vomiting Anesthetic complications: no   No complications documented.   Last Vitals:  Vitals:   10/20/20 1642 10/21/20 0651  BP: (!) 143/96 (!) 119/92  Pulse: 88 93  Resp: 16 14  Temp: 36.8 C 37.2 C  SpO2: 100% 100%    Last Pain:  Vitals:   10/21/20 0651  TempSrc: Oral  PainSc:                  Cleda Mccreedy [place] Lapaglia

## 2020-10-21 NOTE — BHH Group Notes (Signed)
LCSW Group Therapy Note  10/21/2020 3:34 PM  Type of Therapy/Topic:  Group Therapy:  Feelings about Diagnosis  Participation Level:  Active   Description of Group:   This group will allow patients to explore their thoughts and feelings about diagnoses they have received. Patients will be guided to explore their level of understanding and acceptance of these diagnoses. Facilitator will encourage patients to process their thoughts and feelings about the reactions of others to their diagnosis and will guide patients in identifying ways to discuss their diagnosis with significant others in their lives. This group will be process-oriented, with patients participating in exploration of their own experiences, giving and receiving support, and processing challenge from other group members.   Therapeutic Goals: 1. Patient will demonstrate understanding of diagnosis as evidenced by identifying two or more symptoms of the disorder 2. Patient will be able to express two feelings regarding the diagnosis 3. Patient will demonstrate their ability to communicate their needs through discussion and/or role play  Summary of Patient Progress: Patient was present in group. Patient shared that she feels slower and has a hard time focusing when she is struggling with depression.  She reports that she will often stop eating as well. Patient shared that she feels much better about her ECT treatments.  She reports that she is ready to go home.  She was able to engage in discussions on effective coping skills.    Therapeutic Modalities:   Cognitive Behavioral Therapy Brief Therapy Feelings Identification   [name], MSW, LCSW 10/21/2020 3:34 PM

## 2020-10-21 NOTE — Progress Notes (Signed)
Patient remains isolative to her room. She is pleasant and easy to engage. She is alert and oriented X 4.  She is med compliant and tolerates her medication without incident. She denies SI HI AVH at this encounter.  She does endorse back pain, but states she is managing and does not need any meds for pain. She is safe on the unit with 15 minute safety checks and was encouraged to contact staff with nay concerns.       Cleo Butler-Nicholson, LPN

## 2020-10-21 NOTE — Progress Notes (Signed)
D: Pt alert and oriented. Pt rates depression 3/10 and anxiety 3/10. Pt reports experiencing 3/10 headache pain, prn meds given and found effective. Pt denies experiencing any SI/HI, or AVH at this time. Pt appears brighter in appears and states that she feels better and is glad that the ECT seems to be working. Pt is also observed smiling.  A: Scheduled medications administered to pt, per MD orders. Support and encouragement provided. Frequent verbal contact made. Routine safety checks conducted q15 minutes.   R: No adverse drug reactions noted. Pt verbally contracts for safety at this time. Pt complaint with medications and treatment plan. Pt interacts well with others on the unit. Pt remains safe at this time. Will continue to monitor.

## 2020-10-21 NOTE — BHH Counselor (Signed)
CSW met with pt to discuss discharge plans. Pt confirmed that she goes to the [facility] [place] [facility] [place]. Pt shared that [place] is the primary provider. She states that she has transportation and will be returning home. Pt does not need clothes for discharge from [place]. No other concerns expressed. Contact ended without incident.   CSW contacted [facility] regarding aftercare. CSW was informed that pt already has an appointment on 12/2 but agreed to try to move appointment up. Pt was scheduled for 11/23 at 10:20am for an in office appointment. Contact ended without incident. CSW noted new appointment in pt chart.   Chalmers Guest. Guerry Bruin, MSW, LCSW, [place] 10/21/2020 2:13 PM

## 2020-10-21 NOTE — Progress Notes (Signed)
Recreation Therapy Notes   Date: 10/21/2020  Time: 9:30 am   Location: Craft room     Behavioral response: N/A   Intervention Topic: Relaxation   Discussion/Intervention: Patient did not attend group.   Clinical Observations/Feedback:  Patient did not attend group.   Marke Goodwyn LRT/CTRS        Lilas Diefendorf 10/21/2020 11:13 AM

## 2020-10-22 ENCOUNTER — Inpatient Hospital Stay: Payer: Medicare Other | Admitting: Registered Nurse

## 2020-10-22 ENCOUNTER — Encounter: Payer: Self-pay | Admitting: Psychiatry

## 2020-10-22 LAB — GLUCOSE, CAPILLARY: Glucose-Capillary: 98 mg/dL (ref 70–99)

## 2020-10-22 MED ORDER — BACLOFEN 10 MG PO TABS: 10.0000 mg | ORAL_TABLET | Freq: Three times a day (TID) | ORAL | 0 refills | Status: DC

## 2020-10-22 MED ORDER — METHOHEXITAL SODIUM 100 MG/10ML IV SOSY
PREFILLED_SYRINGE | INTRAVENOUS | Status: DC | PRN
  Administered 2020-10-22: 60 mg via INTRAVENOUS

## 2020-10-22 MED ORDER — QUETIAPINE FUMARATE 100 MG PO TABS: 100.0000 mg | ORAL_TABLET | Freq: Every day | ORAL | 0 refills | Status: DC

## 2020-10-22 MED ORDER — HYDROXYZINE HCL 25 MG PO TABS: 25.0000 mg | ORAL_TABLET | Freq: Four times a day (QID) | ORAL | 0 refills | Status: DC | PRN

## 2020-10-22 MED ORDER — SUCCINYLCHOLINE CHLORIDE 20 MG/ML IJ SOLN
INTRAMUSCULAR | Status: DC | PRN
  Administered 2020-10-22: 80 mg via INTRAVENOUS

## 2020-10-22 MED ORDER — SODIUM CHLORIDE 0.9 % IV SOLN: INTRAVENOUS | Status: DC | PRN

## 2020-10-22 MED ORDER — SODIUM CHLORIDE 0.9 % IV SOLN
500.0000 mL | Freq: Once | INTRAVENOUS | Status: AC
  Administered 2020-10-22: 500 mL via INTRAVENOUS

## 2020-10-22 MED ORDER — LABETALOL HCL 5 MG/ML IV SOLN
INTRAVENOUS | Status: DC | PRN
  Administered 2020-10-22 (×2): 5 mg via INTRAVENOUS

## 2020-10-22 MED ORDER — FENTANYL CITRATE (PF) 100 MCG/2ML IJ SOLN: 25.0000 ug | INTRAMUSCULAR | Status: DC | PRN

## 2020-10-22 MED ORDER — CLONAZEPAM 0.5 MG PO TABS: 0.5000 mg | ORAL_TABLET | Freq: Every day | ORAL | 0 refills | Status: DC | PRN

## 2020-10-22 MED ORDER — ONDANSETRON HCL 4 MG/2ML IJ SOLN: 4.0000 mg | Freq: Once | INTRAMUSCULAR | Status: DC | PRN

## 2020-10-22 MED ORDER — BUSPIRONE HCL 10 MG PO TABS: 10.0000 mg | ORAL_TABLET | Freq: Three times a day (TID) | ORAL | 0 refills | Status: DC

## 2020-10-22 MED ORDER — DULOXETINE HCL 60 MG PO CPEP: 120.0000 mg | ORAL_CAPSULE | Freq: Every day | ORAL | 0 refills | Status: DC

## 2020-10-22 MED ORDER — SUCCINYLCHOLINE CHLORIDE 200 MG/10ML IV SOSY
PREFILLED_SYRINGE | INTRAVENOUS | Status: AC
  Filled 2020-10-22: qty 10

## 2020-10-22 MED ORDER — MELOXICAM 15 MG PO TABS: 15.0000 mg | ORAL_TABLET | Freq: Every day | ORAL | 0 refills | Status: DC

## 2020-10-22 MED ORDER — METHOHEXITAL SODIUM 0.5 G IJ SOLR
INTRAMUSCULAR | Status: AC
  Filled 2020-10-22: qty 500

## 2020-10-22 MED ORDER — ESMOLOL HCL 100 MG/10ML IV SOLN
INTRAVENOUS | Status: DC | PRN
  Administered 2020-10-22: 30 mg via INTRAVENOUS

## 2020-10-22 MED ORDER — ESMOLOL HCL 100 MG/10ML IV SOLN
INTRAVENOUS | Status: AC
  Filled 2020-10-22: qty 10

## 2020-10-22 NOTE — Progress Notes (Signed)
PT Cancellation Note  Patient Details Name: Natasha [name] MRN: [id number] DOB: 10-Jun-1971   Cancelled Treatment:    Reason Eval/Treat Not Completed: PT screened, no needs identified, will sign off. Met with patient and RN, patient is at baseline. No new needs identified. Will sign off at this time.    Delayna Sparlin 10/22/2020, 3:23 PM

## 2020-10-22 NOTE — BHH Group Notes (Signed)
LCSW Group Therapy Note  10/22/2020 2:21 PM  Type of Therapy/Topic:  Group Therapy:  Emotion Regulation  Participation Level:  Did Not Attend   Description of Group:   The purpose of this group is to assist patients in learning to regulate negative emotions and experience positive emotions. Patients will be guided to discuss ways in which they have been vulnerable to their negative emotions. These vulnerabilities will be juxtaposed with experiences of positive emotions or situations, and patients will be challenged to use positive emotions to combat negative ones. Special emphasis will be placed on coping with negative emotions in conflict situations, and patients will process healthy conflict resolution skills.  Therapeutic Goals: 1. Patient will identify two positive emotions or experiences to reflect on in order to balance out negative emotions 2. Patient will label two or more emotions that they find the most difficult to experience 3. Patient will demonstrate positive conflict resolution skills through discussion and/or role plays  Summary of Patient Progress: X  Therapeutic Modalities:   Cognitive Behavioral Therapy Feelings Identification Dialectical Behavioral Therapy  Cedar Roseman R. Naelle Diegel, MSW, LCSW, LCAS 10/22/2020 2:21 PM   

## 2020-10-22 NOTE — BHH Suicide Risk Assessment (Signed)
[facility] Discharge Suicide Risk Assessment   Principal Problem: Severe recurrent major depression without psychotic features [facility] Discharge Diagnoses: Principal Problem:   Severe recurrent major depression without psychotic features (HCC) Active Problems:   Paraplegia (HCC)   History of DVT (deep vein thrombosis)   Total Time spent with patient: 30 minutes  Musculoskeletal: Strength & Muscle Tone: spastic and abnormal Gait & Station: normal, unable to stand Patient leans: N/A  Psychiatric Specialty Exam: Review of Systems  Constitutional: Negative.   HENT: Negative.   Eyes: Negative.   Respiratory: Negative.   Cardiovascular: Negative.   Gastrointestinal: Negative.   Musculoskeletal: Negative.   Skin: Negative.   Neurological: Negative.   Psychiatric/Behavioral: Negative.     Blood pressure (!) 119/92, pulse 93, temperature 99 F (37.2 C), temperature source Oral, resp. rate 14, height 5' (1.524 m), weight 65.8 kg, SpO2 100 %.Body mass index is 28.32 kg/m.  General Appearance: Casual  Eye Contact::  Good  Speech:  Clear and Coherent409  Volume:  Normal  Mood:  Euthymic  Affect:  Congruent  Thought Process:  Goal Directed  Orientation:  Full (Time, Place, and Person)  Thought Content:  Logical  Suicidal Thoughts:  No  Homicidal Thoughts:  No  Memory:  Immediate;   Fair Recent;   Fair Remote;   Fair  Judgement:  Fair  Insight:  Fair  Psychomotor Activity:  Normal  Concentration:  Fair  Recall:  [employer] of Knowledge:Fair  Language: Fair  Akathisia:  No  Handed:  Right  AIMS (if indicated):     Assets:  Desire for Improvement Housing Resilience Social Support  Sleep:  Number of Hours: 9  Cognition: WNL  ADL's:  Intact   Mental Status Per Nursing Assessment::   On Admission:  NA  Demographic Factors:  Living alone and Unemployed  Loss Factors: Loss of significant relationship  Historical Factors: Prior suicide attempts and Impulsivity  Risk  Reduction Factors:   Positive therapeutic relationship  Continued Clinical Symptoms:  Depression:   Severe  Cognitive Features That Contribute To Risk:  None    Suicide Risk:  Minimal: No identifiable suicidal ideation.  Patients presenting with no risk factors but with morbid ruminations; may be classified as minimal risk based on the severity of the depressive symptoms   Follow-up Information    [facility]. Go on 11/04/2020.   Specialty: Behavioral Health Why: Your appointment is scheduled for 11/04/20 at 10:20am. This will be an in office visit. If you have any concerns contact your provider.  Contact information: [address] [place] [address] [phone]               Plan Of Care/Follow-up recommendations:  Activity:  Activity as tolerated Diet:  Regular diet Other:  Follow-up with outpatient mental health agency as previously done  Mordecai Rasmussen, MD 10/22/2020, 11:14 AM

## 2020-10-22 NOTE — Discharge Summary (Signed)
Physician Discharge Summary Note  Patient:  Natasha [name] is an 49 y.o., female MRN:  [id number] DOB:  11/04/1971 Patient phone:  [phone] (home)  Patient address:   [address] [place] [id number],  Total Time spent with patient: 30 minutes  Date of Admission:  10/16/2020 Date of Discharge: 10/22/2020  Reason for Admission: [facility] our hospital [facility] where she had been admitted for return of major depression with suicidal ideation and self injuring behavior.  Transferred to our facility because of preference for and unique response to electroconvulsive therapy  Principal Problem: Severe recurrent major depression without psychotic features [facility](HCC) Discharge Diagnoses: Principal Problem:   Severe recurrent major depression without psychotic features (HCC) Active Problems:   Paraplegia (HCC)   History of DVT (deep vein thrombosis)   Past Psychiatric History: Longstanding severe depression.  Multiple serious past suicide attempts.  Has had a good response to ECT in the past.  Past Medical History:  Past Medical History:  Diagnosis Date  . Anxiety   . C6 spinal cord injury (HCC)   . Depression     Past Surgical History:  Procedure Laterality Date  . BACK SURGERY    . NECK SURGERY     Family History:  Family History  Problem Relation Age of Onset  . Alcohol abuse Father   . Alcohol abuse Brother   . Depression Mother   . Alcohol abuse Mother   . Parkinson's disease Mother    Family Psychiatric  History: See previous Social History:  Social History   Substance and Sexual Activity  Alcohol Use Yes  . Alcohol/week: 0.0 standard drinks   Comment: Occasional use     Social History   Substance and Sexual Activity  Drug Use No    Social History   Socioeconomic History  . Marital status: Single    Spouse name: Not on file  . Number of children: Not on file  . Years of education: Not on  file  . Highest education level: Not on file  Occupational History  . Occupation: Disability  Tobacco Use  . Smoking status: Current Every Day Smoker    Packs/day: 1.00    Types: Cigarettes  . Smokeless tobacco: Never Used  Vaping Use  . Vaping Use: Never used  Substance and Sexual Activity  . Alcohol use: Yes    Alcohol/week: 0.0 standard drinks    Comment: Occasional use  . Drug use: No  . Sexual activity: Not Currently  Other Topics Concern  . Not on file  Social History Narrative   Pt lives alone; receives outpatient psychiatry services through [facility].  Pt is on disability due to a spinal cord injury.    Social Determinants of Health   Financial Resource Strain:   . Difficulty of Paying Living Expenses: Not on file  Food Insecurity:   . Worried About Programme researcher, broadcasting/film/videounning Out of Food in the Last Year: Not on file  . Ran Out of Food in the Last Year: Not on file  Transportation Needs:   . Lack of Transportation (Medical): Not on file  . Lack of Transportation (Non-Medical): Not on file  Physical Activity:   . Days of Exercise per Week: Not on file  . Minutes of Exercise per Session: Not on file  Stress:   . Feeling of Stress : Not on file  Social Connections:   . Frequency of Communication with Friends and Family: Not on file  . Frequency  of Social Gatherings with Friends and Family: Not on file  . Attends Religious Services: Not on file  . Active Member of Clubs or Organizations: Not on file  . Attends Banker Meetings: Not on file  . Marital Status: Not on file    Hospital Course: Admitted to the psychiatric unit.  Plan was to begin ECT as soon as possible.  I was familiar with the patient from previous treatment and new that she tolerated it well.  We were able to start ECT the day after she came into the hospital.  She has had a total of 3 right unilateral treatments as of discharge.  Treatments have all been tolerated well without any complaints of  confusion pain or memory impairment.  She reports that her mood has improved dramatically.  She noted improvement even after the first treatment.  She denies any suicidal thoughts.  She is smiling more upbeat more positive about the future more energetic.  I discussed with her the possibility of maintenance ECT.  She is familiar with the idea but feels it would be impractical at this time.  She knows that she can reach out to [place] in the future if she would like to continue maintenance or needs more index treatment.  For now she will follow-up with her usual outpatient providers at [facility].  Continued medications as they were at the time of transfer.  No new medical issues noted.  Patient gets physical therapy at home and should continue to get that weekly as she had in the past.  Physical Findings: AIMS:  , ,  ,  ,    CIWA:    COWS:     Musculoskeletal: Strength & Muscle Tone: spastic and decreased Gait & Station: unable to stand Patient leans: N/A  Psychiatric Specialty Exam: Physical Exam Constitutional:      Appearance: She is well-developed.  HENT:     Head: Normocephalic and atraumatic.  Eyes:     Conjunctiva/sclera: Conjunctivae normal.     Pupils: Pupils are equal, round, and reactive to light.  Cardiovascular:     Heart sounds: Normal heart sounds.  Pulmonary:     Effort: Pulmonary effort is normal.  Abdominal:     Palpations: Abdomen is soft.  Musculoskeletal:        General: Normal range of motion.     Cervical back: Normal range of motion.  Skin:    General: Skin is warm and dry.  Neurological:     Mental Status: She is alert.     Comments:  Quadriplegic.  Some retained strength and movement in the core.  Especially upper extremities.  Psychiatric:        Mood and Affect: Mood normal.     Review of Systems  Constitutional: Negative.   HENT: Negative.   Eyes: Negative.   Respiratory: Negative.   Cardiovascular: Negative.   Gastrointestinal: Negative.    Musculoskeletal: Negative.   Skin: Negative.   Neurological: Positive for weakness.  Psychiatric/Behavioral: Negative.     Blood pressure (!) 132/91, pulse 77, temperature 97.6 F (36.4 C), resp. rate 14, height 5' (1.524 m), weight 65.8 kg, SpO2 96 %.Body mass index is 28.32 kg/m.  General Appearance: Casual  Eye Contact:  Good  Speech:  Clear and Coherent  Volume:  Normal  Mood:  Euthymic  Affect:  Congruent  Thought Process:  Goal Directed  Orientation:  Full (Time, Place, and Person)  Thought Content:  Logical  Suicidal Thoughts:  No  Homicidal Thoughts:  No  Memory:  Immediate;   Fair Recent;   Fair Remote;   Fair  Judgement:  Fair  Insight:  Fair  Psychomotor Activity:  Normal  Concentration:  Concentration: Fair  Recall:  Fair  Fund of Knowledge:  Fair  Language:  Fair  Akathisia:  No  Handed:  Right  AIMS (if indicated):     Assets:  Desire for Improvement Housing Resilience Social Support  ADL's:  Intact  Cognition:  WNL  Sleep:  Number of Hours: 9     Have you used any form of tobacco in the last 30 days? (Cigarettes, Smokeless Tobacco, Cigars, and/or Pipes): No  Has this patient used any form of tobacco in the last 30 days? (Cigarettes, Smokeless Tobacco, Cigars, and/or Pipes) Yes, No  Blood Alcohol level:  Lab Results  Component Value Date   ETH <10 10/16/2020   ETH <10 10/18/2017    Metabolic Disorder Labs:  Lab Results  Component Value Date   HGBA1C 4.6 (L) 10/16/2020   MPG 85.32 10/16/2020   MPG 105.41 05/26/2020   Lab Results  Component Value Date   PROLACTIN 121.9 (H) 02/03/2017   Lab Results  Component Value Date   CHOL 236 (H) 10/16/2020   TRIG 94 10/16/2020   HDL 78 10/16/2020   CHOLHDL 3.0 10/16/2020   VLDL 19 10/16/2020   LDLCALC 139 (H) 10/16/2020   LDLCALC 160 (H) 05/26/2020    See Psychiatric Specialty Exam and Suicide Risk Assessment completed by Attending Physician prior to discharge.  Discharge destination:   Home  Is patient on multiple antipsychotic therapies at discharge:  No   Has Patient had three or more failed trials of antipsychotic monotherapy by history:  No  Recommended Plan for Multiple Antipsychotic Therapies: NA  Discharge Instructions    Diet - low sodium heart healthy   Complete by: As directed    Increase activity slowly   Complete by: As directed      Allergies as of 10/22/2020   No Known Allergies     Medication List    TAKE these medications     Indication  baclofen 10 MG tablet Commonly known as: LIORESAL Take 1 tablet (10 mg total) by mouth 3 (three) times daily.  Indication: Muscle Spasm   busPIRone 10 MG tablet Commonly known as: BUSPAR Take 1 tablet (10 mg total) by mouth 3 (three) times daily. What changed: additional instructions  Indication: Major Depressive Disorder   clonazePAM 0.5 MG tablet Commonly known as: KLONOPIN Take 1 tablet (0.5 mg total) by mouth daily as needed for anxiety.  Indication: Feeling Anxious   DULoxetine 60 MG capsule Commonly known as: CYMBALTA Take 2 capsules (120 mg total) by mouth daily.  Indication: Major Depressive Disorder   hydrOXYzine 25 MG tablet Commonly known as: ATARAX/VISTARIL Take 1 tablet (25 mg total) by mouth every 6 (six) hours as needed for anxiety.  Indication: Feeling Anxious   meloxicam 15 MG tablet Commonly known as: MOBIC Take 1 tablet (15 mg total) by mouth daily.  Indication: Joint Damage causing Pain and Loss of Function   QUEtiapine 100 MG tablet Commonly known as: SEROQUEL Take 1 tablet (100 mg total) by mouth at bedtime.  Indication: Major Depressive Disorder       Follow-up Information    Center[facility]. Go on 11/04/2020.   Specialty: Behavioral Health Why: Your appointment is scheduled for 11/04/20 at 10:20am. This will be an in office visit. If you  have any concerns contact your provider.  Contact information: [address] [place] [address] [phone]               Follow-up recommendations:  Activity:  Activity as tolerated with continued physical therapy for her chronic quadriplegia.  Continue weekly treatment as it had in the past Diet:  Regular diet Other:  [facility] with [facility]  Comments: Prescriptions have been sent in electronically  Signed: Mordecai Rasmussen, MD 10/22/2020, 2:02 PM

## 2020-10-22 NOTE — Tx Team (Signed)
Interdisciplinary Treatment and Diagnostic Plan Update  10/22/2020 Time of Session: 8:30AM Natasha [name] MRN: [id number]  Principal Diagnosis: Severe recurrent major depression without psychotic features [facility])  Secondary Diagnoses: Principal Problem:   Severe recurrent major depression without psychotic features (HCC) Active Problems:   Paraplegia (HCC)   History of DVT (deep vein thrombosis)   Current Medications:  Current Facility-Administered Medications  Medication Dose Route Frequency Provider Last Rate Last Admin  . acetaminophen (TYLENOL) tablet 650 mg  650 mg Oral Q6H PRN Clapacs, Jackquline Denmark, MD   650 mg at 10/21/20 0806  . alum & mag hydroxide-simeth (MAALOX/MYLANTA) 200-200-20 MG/5ML suspension 30 mL  30 mL Oral Q4H PRN Clapacs, John T, MD      . baclofen (LIORESAL) tablet 10 mg  10 mg Oral TID Clapacs, Jackquline Denmark, MD   10 mg at 10/21/20 1656  . busPIRone (BUSPAR) tablet 10 mg  10 mg Oral TID Clapacs, Jackquline Denmark, MD   10 mg at 10/21/20 1656  . DULoxetine (CYMBALTA) DR capsule 120 mg  120 mg Oral Daily Clapacs, Jackquline Denmark, MD   120 mg at 10/21/20 0804  . hydrOXYzine (ATARAX/VISTARIL) tablet 25 mg  25 mg Oral Q6H PRN Beverly Sessions, MD   25 mg at 10/20/20 1945  . magnesium hydroxide (MILK OF MAGNESIA) suspension 30 mL  30 mL Oral Daily PRN Clapacs, John T, MD      . multivitamin with minerals tablet 1 tablet  1 tablet Oral Daily Clapacs, Jackquline Denmark, MD   1 tablet at 10/21/20 0804  . QUEtiapine (SEROQUEL) tablet 100 mg  100 mg Oral QHS Clapacs, Jackquline Denmark, MD   100 mg at 10/21/20 2104  . thiamine tablet 100 mg  100 mg Oral Daily Clapacs, Jackquline Denmark, MD   100 mg at 10/21/20 0804   PTA Medications: Medications Prior to Admission  Medication Sig Dispense Refill Last Dose  . baclofen (LIORESAL) 10 MG tablet Take 10 mg by mouth 3 (three) times daily.   10/16/2020  . busPIRone (BUSPAR) 10 MG tablet Take 1 tablet (10 mg total) by mouth 3 (three) times daily. For anxiety 45 tablet 1 10/16/2020  .  clonazePAM (KLONOPIN) 0.5 MG tablet Take 0.5 mg by mouth daily as needed for anxiety.   10/16/2020  . DULoxetine (CYMBALTA) 60 MG capsule Take 2 capsules (120 mg total) by mouth daily. 60 capsule 0 10/16/2020  . meloxicam (MOBIC) 15 MG tablet Take 15 mg by mouth daily.   10/16/2020  . QUEtiapine (SEROQUEL) 100 MG tablet Take 1 tablet (100 mg total) by mouth at bedtime. 30 tablet 0 10/16/2020    Patient Stressors: Financial difficulties Health problems Traumatic event  Patient Strengths: Ability for insight Average or above average intelligence Capable of independent living Communication skills Motivation for treatment/growth Work skills  Treatment Modalities: Medication Management, Group therapy, Case management,  1 to 1 session with clinician, Psychoeducation, Recreational therapy.   Physician Treatment Plan for Primary Diagnosis: Severe recurrent major depression without psychotic features (HCC) Long Term Goal(s): Improvement in symptoms so as ready for discharge Improvement in symptoms so as ready for discharge   Short Term Goals: Ability to verbalize feelings will improve Ability to disclose and discuss suicidal ideas Ability to demonstrate self-control will improve Ability to demonstrate self-control will improve Ability to maintain clinical measurements within normal limits will improve  Medication Management: Evaluate patient's response, side effects, and tolerance of medication regimen.  Therapeutic Interventions: 1 to 1 sessions, Unit Group sessions and Medication  administration.  Evaluation of Outcomes: Progressing  Physician Treatment Plan for Secondary Diagnosis: Principal Problem:   Severe recurrent major depression without psychotic features (HCC) Active Problems:   Paraplegia (HCC)   History of DVT (deep vein thrombosis)  Long Term Goal(s): Improvement in symptoms so as ready for discharge Improvement in symptoms so as ready for discharge   Short Term Goals:  Ability to verbalize feelings will improve Ability to disclose and discuss suicidal ideas Ability to demonstrate self-control will improve Ability to demonstrate self-control will improve Ability to maintain clinical measurements within normal limits will improve     Medication Management: Evaluate patient's response, side effects, and tolerance of medication regimen.  Therapeutic Interventions: 1 to 1 sessions, Unit Group sessions and Medication administration.  Evaluation of Outcomes: Progressing   RN Treatment Plan for Primary Diagnosis: Severe recurrent major depression without psychotic features (HCC) Long Term Goal(s): Knowledge of disease and therapeutic regimen to maintain health will improve  Short Term Goals: Ability to remain free from injury will improve, Ability to demonstrate self-control, Ability to participate in decision making will improve, Ability to verbalize feelings will improve, Ability to identify and develop effective coping behaviors will improve and Compliance with prescribed medications will improve  Medication Management: RN will administer medications as ordered by provider, will assess and evaluate patient's response and provide education to patient for prescribed medication. RN will report any adverse and/or side effects to prescribing provider.  Therapeutic Interventions: 1 on 1 counseling sessions, Psychoeducation, Medication administration, Evaluate responses to treatment, Monitor vital signs and CBGs as ordered, Perform/monitor CIWA, COWS, AIMS and Fall Risk screenings as ordered, Perform wound care treatments as ordered.  Evaluation of Outcomes: Progressing   LCSW Treatment Plan for Primary Diagnosis: Severe recurrent major depression without psychotic features (HCC) Long Term Goal(s): Safe transition to appropriate next level of care at discharge, Engage patient in therapeutic group addressing interpersonal concerns.  Short Term Goals: Engage patient  in aftercare planning with referrals and resources, Increase social support, Increase ability to appropriately verbalize feelings, Increase emotional regulation, Facilitate acceptance of mental health diagnosis and concerns, Identify triggers associated with mental health/substance abuse issues and Increase skills for wellness and recovery  Therapeutic Interventions: Assess for all discharge needs, 1 to 1 time with Social worker, Explore available resources and support systems, Assess for adequacy in community support network, Educate family and significant other(s) on suicide prevention, Complete Psychosocial Assessment, Interpersonal group therapy.  Evaluation of Outcomes: Progressing   Progress in Treatment: Attending groups: Yes. Participating in groups: Yes. Taking medication as prescribed: Yes. Toleration medication: Yes. Family/Significant other contact made: Yes, individual(s) contacted:  SPE completed with the patient, patient declined collateral contact.  Patient understands diagnosis: Yes. Discussing patient identified problems/goals with staff: Yes. Medical problems stabilized or resolved: Yes. Denies suicidal/homicidal ideation: Yes. Issues/concerns per patient self-inventory: No. Other: None.  New problem(s) identified: No, Describe:  none.  New Short Term/Long Term Goal(s): medication management for mood stabilization; elimination of SI thoughts; development of comprehensive mental wellness plan.  Update 10/22/2020: No changes at this time.   Patient Goals: "I'm just here for ECT."  Update 10/22/2020: No changes at this time.   Discharge Plan or Barriers: Pt plans to discharge home with continued treatment through [facility]. Update 10/22/2020: Pt reports plans to return to her home.  She reports that she will follow up with her current mental health providers.   Reason for Continuation of Hospitalization: Depression Medication stabilization  Estimated Length of  Stay:  1-7 days  Attendees: Patient:  10/22/2020 10:05 AM  Physician: Les Pou, MD 10/22/2020 10:05 AM  Nursing:  10/22/2020 10:05 AM  RN Care Manager: 10/22/2020 10:05 AM  Social Worker: [name], MSW, LCSW 10/22/2020 10:05 AM  Recreational Therapist:  10/22/2020 10:05 AM  Other: Vilma Meckel. Algis Greenhouse, MSW, LCSW, LCAS 10/22/2020 10:05 AM  Other:  10/22/2020 10:05 AM  Other: 10/22/2020 10:05 AM    Scribe for Treatment Team: Harden Mo, LCSW 10/22/2020 10:05 AM

## 2020-10-22 NOTE — Progress Notes (Signed)
Pt remains depressed and anxious but she is cooperative with treatment.  She is medication complaint. Pt denies any SI/HI, or AVH at this time.  No issues to report on shift at this time. Patient appeared to rest well through out the night.

## 2020-10-22 NOTE — Procedures (Signed)
ECT SERVICES Physician's Interval Evaluation & Treatment Note  Patient Identification: Natasha [name] MRN:  [id number] Date of Evaluation:  10/22/2020 TX #: 3  MADRS:   MMSE:   P.E. Findings:  No change to basic physical exam.  Chronic quadriplegia.  Heart and lungs normal.  Psychiatric Interval Note:  Patient continues to report a great improvement in her mood.  Feels back to baseline.  Subjective:  Patient is a 49 y.o. female seen for evaluation for Electroconvulsive Therapy. No suicidal thoughts no psychosis no longer feeling depressed  Treatment Summary:   [x]   Right Unilateral             []  Bilateral   % Energy : 0.3 ms 55%   Impedance: 1080 ohms  Seizure Energy Index: 6901 V squared  Postictal Suppression Index: 76%  Seizure Concordance Index: 75%  Medications  Pre Shock: Brevital 60 mg succinylcholine 80 mg  Post Shock:    Seizure Duration: 19 seconds EMG 40 seconds EEG   Comments: Well-tolerated treatment.  Patient is requesting discharge and does not want maintenance treatment.  She is experienced in managing her illness and will continue follow-up with her outpatient psychiatrist.  She knows she can return to if needed.  Lungs:  [x]   Clear to auscultation               []  Other:   Heart:    [x]   Regular rhythm             []  irregular rhythm    [x]   Previous H&P reviewed, patient examined and there are NO CHANGES                 []   Previous H&P reviewed, patient examined and there are changes noted.   , MD 11/10/20215:47 PM

## 2020-10-22 NOTE — Progress Notes (Signed)
  [facility] Adult Case Management Discharge Plan :  Will you be returning to the same living situation after discharge:  Yes,  pt plans to return home.  At discharge, do you have transportation home?: Yes,  pt reports friend is here in town and will be providing transportation home.  Do you have the ability to pay for your medications: Yes,  Medicare/Medicare Part A and B.  Release of information consent forms completed and in the chart;  Patient's signature needed at discharge.  Patient to Follow up at:  Follow-up Information    [facility]. Go on 11/04/2020.   Specialty: Behavioral Health Why: Your appointment is scheduled for 11/04/20 at 10:20am. This will be an in office visit. If you have any concerns contact your provider.  Contact information: [address] [place] [address] [phone]               Next level of care provider has access to [facility] Link:no  Safety Planning and Suicide Prevention discussed: Yes,  SPE completed with pt. Pt declined collateral contact.   Have you used any form of tobacco in the last 30 days? (Cigarettes, Smokeless Tobacco, Cigars, and/or Pipes): No  Has patient been referred to the Quitline?: N/A patient is not a smoker  Patient has been referred for addiction treatment: N/A  [name], LCSW 10/22/2020, 10:03 AM

## 2020-10-22 NOTE — Progress Notes (Signed)
Pt denies SI, HI and AVH. Pt was educated on dc plan and verbalizes understanding. Pt was given belongings and dc packet. Wai Minotti RN 

## 2020-10-22 NOTE — H&P (Signed)
Natasha [name] is an 49 y.o. female.   Chief Complaint: mood much better HPI: chronic depression. Stable quadriplegia  Past Medical History:  Diagnosis Date  . Anxiety   . C6 spinal cord injury (HCC)   . Depression     Past Surgical History:  Procedure Laterality Date  . BACK SURGERY    . NECK SURGERY      Family History  Problem Relation Age of Onset  . Alcohol abuse Father   . Alcohol abuse Brother   . Depression Mother   . Alcohol abuse Mother   . Parkinson's disease Mother    Social History:  reports that she has been smoking cigarettes. She has been smoking about 1.00 pack per day. She has never used smokeless tobacco. She reports current alcohol use. She reports that she does not use drugs.  Allergies: No Known Allergies  Medications Prior to Admission  Medication Sig Dispense Refill  . baclofen (LIORESAL) 10 MG tablet Take 10 mg by mouth 3 (three) times daily.    . busPIRone (BUSPAR) 10 MG tablet Take 1 tablet (10 mg total) by mouth 3 (three) times daily. For anxiety 45 tablet 1  . clonazePAM (KLONOPIN) 0.5 MG tablet Take 0.5 mg by mouth daily as needed for anxiety.    . DULoxetine (CYMBALTA) 60 MG capsule Take 2 capsules (120 mg total) by mouth daily. 60 capsule 0  . meloxicam (MOBIC) 15 MG tablet Take 15 mg by mouth daily.    . QUEtiapine (SEROQUEL) 100 MG tablet Take 1 tablet (100 mg total) by mouth at bedtime. 30 tablet 0    Results for orders placed or performed during the hospital encounter of 10/16/20 (from the past 48 hour(s))  Pregnancy, urine POC     Status: None   Collection Time: 10/20/20  5:20 PM  Result Value Ref Range   Preg Test, Ur NEGATIVE NEGATIVE    Comment:        THE SENSITIVITY OF THIS METHODOLOGY IS >24 mIU/mL   Glucose, capillary     Status: None   Collection Time: 10/22/20  7:03 AM  Result Value Ref Range   Glucose-Capillary 98 70 - 99 mg/dL    Comment: Glucose reference range applies only to samples taken after fasting for  at least 8 hours.   Comment 1 Notify RN    No results found.  Review of Systems  Constitutional: Negative.   HENT: Negative.   Eyes: Negative.   Respiratory: Negative.   Cardiovascular: Negative.   Gastrointestinal: Negative.   Musculoskeletal: Negative.   Skin: Negative.   Neurological: Positive for weakness.  Psychiatric/Behavioral: Negative.     Blood pressure (!) 119/92, pulse 93, temperature 99 F (37.2 C), temperature source Oral, resp. rate 14, height 5' (1.524 m), weight 65.8 kg, SpO2 100 %. Physical Exam Vitals and nursing note reviewed.  Constitutional:      Appearance: She is well-developed.  HENT:     Head: Normocephalic and atraumatic.  Eyes:     Conjunctiva/sclera: Conjunctivae normal.     Pupils: Pupils are equal, round, and reactive to light.  Cardiovascular:     Heart sounds: Normal heart sounds.  Pulmonary:     Effort: Pulmonary effort is normal.  Abdominal:     Palpations: Abdomen is soft.  Musculoskeletal:        General: Normal range of motion.     Cervical back: Normal range of motion.  Skin:    General: Skin is warm and dry.  Neurological:     Mental Status: She is alert.     Comments: Baseline quadriplegic deficit  Psychiatric:        Attention and Perception: Attention normal.        Mood and Affect: Mood normal.        Speech: Speech normal.        Behavior: Behavior normal.        Thought Content: Thought content normal.        Cognition and Memory: Cognition normal.        Judgment: Judgment normal.      Assessment/Plan Last ECT this round with discharge home this afternoon  Mordecai Rasmussen, MD 10/22/2020, 11:55 AM

## 2020-10-22 NOTE — Progress Notes (Signed)
Recreation Therapy Notes  INPATIENT RECREATION TR PLAN  Patient Details Name: Natasha [name] MRN: [id number] DOB: September 20, 1971 Today's Date: 10/22/2020  Rec Therapy Plan Is patient appropriate for Therapeutic Recreation?: Yes Treatment times per week: at least 3 Estimated Length of Stay: 5-7 days TR Treatment/Interventions: Group participation (Comment)  Discharge Criteria Pt will be discharged from therapy if:: Discharged Treatment plan/goals/alternatives discussed and agreed upon by:: Patient/family  Discharge Summary Short term goals set: Patient will engage in groups without prompting or encouragement from LRT x3 group sessions within 5 recreation therapy group sessions Short term goals met: Not met Reason goals not met: Patient did not attend any groups Therapeutic equipment acquired: N/A Reason patient discharged from therapy: Discharge from hospital Pt/family agrees with progress & goals achieved: Yes Date patient discharged from therapy: 10/22/20   Aleczander Fandino 10/22/2020, 4:29 PM

## 2020-10-22 NOTE — Progress Notes (Signed)
Recreation Therapy Notes  Date: 10/22/2020  Time: 9:30 am   Location: Craft room     Behavioral response: N/A   Intervention Topic: Leisure   Discussion/Intervention: Patient did not attend group.   Clinical Observations/Feedback:  Patient did not attend group.   Tkeya Stencil LRT/CTRS        Latif Nazareno 10/22/2020 12:47 PM

## 2020-10-22 NOTE — Transfer of Care (Signed)
Immediate Anesthesia Transfer of Care Note  Patient: Natasha [name]  Procedure(s) Performed: ECT TX  Patient Location: PACU  Anesthesia Type:General  Level of Consciousness: drowsy  Airway & Oxygen Therapy: Patient Spontanous Breathing  Post-op Assessment: Report given to RN and Post -op Vital signs reviewed and stable  Post vital signs: Reviewed and stable  Last Vitals:  Vitals Value Taken Time  BP 154/101   Temp 36.5 C   Pulse 81   Resp 26   SpO2 94%     Last Pain:  Vitals:   10/22/20 1311  TempSrc:   PainSc: 0-No pain      Patients Stated Pain Goal: 0 (10/21/20 0804)  Complications: No complications documented.

## 2020-10-22 NOTE — Anesthesia Preprocedure Evaluation (Addendum)
Anesthesia Evaluation  Patient identified by MRN, date of birth, ID band Patient awake    Reviewed: Allergy & Precautions, H&P , NPO status , Patient's Chart, lab work & pertinent test results, reviewed documented beta blocker date and time   Airway Mallampati: II   Neck ROM: full    Dental  (+) Teeth Intact   Pulmonary neg pulmonary ROS, Current Smoker,    Pulmonary exam normal        Cardiovascular Exercise Tolerance: Good negative cardio ROS Normal cardiovascular exam Rhythm:regular Rate:Normal     Neuro/Psych Anxiety Depression negative neurological ROS  negative psych ROS   GI/Hepatic negative GI ROS, Neg liver ROS,   Endo/Other  negative endocrine ROS  Renal/GU negative Renal ROS  negative genitourinary   Musculoskeletal   Abdominal   Peds  Hematology negative hematology ROS (+)   Anesthesia Other Findings Past Medical History: No date: Anxiety No date: C6 spinal cord injury (HCC) No date: Depression Past Surgical History: No date: BACK SURGERY No date: NECK SURGERY BMI    Body Mass Index: 28.32 kg/m     Reproductive/Obstetrics negative OB ROS                            Anesthesia Physical Anesthesia Plan  ASA: III  Anesthesia Plan: General   Post-op Pain Management:    Induction:   PONV Risk Score and Plan:   Airway Management Planned:   Additional Equipment:   Intra-op Plan:   Post-operative Plan:   Informed Consent: I have reviewed the patients History and Physical, chart, labs and discussed the procedure including the risks, benefits and alternatives for the proposed anesthesia with the patient or authorized representative who has indicated his/her understanding and acceptance.     Dental Advisory Given  Plan Discussed with: CRNA  Anesthesia Plan Comments:         Anesthesia Quick Evaluation

## 2020-10-22 NOTE — Plan of Care (Signed)
  Problem: Group Participation Goal: STG - Patient will engage in groups without prompting or encouragement from LRT x3 group sessions within 5 recreation therapy group sessions Description: STG - Patient will engage in groups without prompting or encouragement from LRT x3 group sessions within 5 recreation therapy group sessions 10/22/2020 1627 by Alveria Apley, LRT Outcome: Not Applicable 10/22/2020 1627 by Alveria Apley, LRT Note: Patient did not attend any groups.

## 2020-10-22 NOTE — Plan of Care (Signed)
Pt rates depression and anxiety bot at 4/10. Pt denies SI, HI and AVH. Pt was educated on care plan and verbalizes understanding. Pt is NPO and anticipating ECT. Torrie Mayers RN Problem: Education: Goal: Knowledge of [facility] Education information/materials will improve Outcome: Adequate for Discharge Goal: Emotional status will improve Outcome: Adequate for Discharge Goal: Mental status will improve Outcome: Adequate for Discharge Goal: Verbalization of understanding the information provided will improve Outcome: Adequate for Discharge   Problem: Health Behavior/Discharge Planning: Goal: Identification of resources available to assist in meeting health care needs will improve Outcome: Adequate for Discharge Goal: Compliance with treatment plan for underlying cause of condition will improve Outcome: Adequate for Discharge   Problem: Education: Goal: Utilization of techniques to improve thought processes will improve Outcome: Adequate for Discharge   Problem: Coping: Goal: Coping ability will improve Outcome: Adequate for Discharge Goal: Will verbalize feelings Outcome: Adequate for Discharge   Problem: Role Relationship: Goal: Will demonstrate positive changes in social behaviors and relationships Outcome: Adequate for Discharge   Problem: Safety: Goal: Ability to disclose and discuss suicidal ideas will improve Outcome: Adequate for Discharge Goal: Ability to identify and utilize support systems that promote safety will improve Outcome: Adequate for Discharge

## 2020-10-23 NOTE — Anesthesia Postprocedure Evaluation (Signed)
Anesthesia Post Note  Patient: Natasha [name]  Procedure(s) Performed: ECT TX  Patient location during evaluation: PACU Anesthesia Type: General Level of consciousness: awake and alert Pain management: pain level controlled Vital Signs Assessment: post-procedure vital signs reviewed and stable Respiratory status: spontaneous breathing, nonlabored ventilation, respiratory function stable and patient connected to nasal cannula oxygen Cardiovascular status: blood pressure returned to baseline and stable Postop Assessment: no apparent nausea or vomiting Anesthetic complications: no   No complications documented.   Last Vitals:  Vitals:   10/22/20 1310 10/22/20 1311  BP:  (!) 132/91  Pulse: 75 77  Resp: 16 14  Temp: 36.4 C   SpO2: 92% 96%    Last Pain:  Vitals:   10/22/20 1311  TempSrc:   PainSc: 0-No pain                 Yevette Edwards

## 2020-12-23 ENCOUNTER — Ambulatory Visit: Payer: Medicare Other

## 2021-02-25 ENCOUNTER — Ambulatory Visit: Payer: Medicare Other

## 2021-03-22 ENCOUNTER — Emergency Department (HOSPITAL_BASED_OUTPATIENT_CLINIC_OR_DEPARTMENT_OTHER): Payer: Medicare Other

## 2021-03-22 ENCOUNTER — Emergency Department (HOSPITAL_BASED_OUTPATIENT_CLINIC_OR_DEPARTMENT_OTHER)
Admission: EM | Admit: 2021-03-22 | Discharge: 2021-03-22 | Disposition: A | Discharge status: Home / Self Care | Payer: Medicare Other | Attending: Emergency Medicine | Admitting: Emergency Medicine

## 2021-03-22 ENCOUNTER — Other Ambulatory Visit: Payer: Self-pay

## 2021-03-22 ENCOUNTER — Encounter (HOSPITAL_BASED_OUTPATIENT_CLINIC_OR_DEPARTMENT_OTHER): Payer: Self-pay | Admitting: *Deleted

## 2021-03-22 DIAGNOSIS — Z23 Encounter for immunization: Secondary | ICD-10-CM | POA: Insufficient documentation

## 2021-03-22 DIAGNOSIS — Z87891 Personal history of nicotine dependence: Secondary | ICD-10-CM | POA: Diagnosis not present

## 2021-03-22 DIAGNOSIS — Y9389 Activity, other specified: Secondary | ICD-10-CM | POA: Diagnosis not present

## 2021-03-22 DIAGNOSIS — S51011A Laceration without foreign body of right elbow, initial encounter: Secondary | ICD-10-CM

## 2021-03-22 MED ORDER — BACITRACIN ZINC 500 UNIT/GM EX OINT
1.0000 "application " | TOPICAL_OINTMENT | Freq: Once | CUTANEOUS | Status: AC
  Administered 2021-03-22: 1 via TOPICAL

## 2021-03-22 MED ORDER — LIDOCAINE-EPINEPHRINE (PF) 2 %-1:200000 IJ SOLN
10.0000 mL | Freq: Once | INTRAMUSCULAR | Status: AC
  Administered 2021-03-22: 10 mL
  Filled 2021-03-22: qty 20

## 2021-03-22 MED ORDER — TETANUS-DIPHTH-ACELL PERTUSSIS 5-2.5-18.5 LF-MCG/0.5 IM SUSY
0.5000 mL | PREFILLED_SYRINGE | Freq: Once | INTRAMUSCULAR | Status: AC
  Administered 2021-03-22: 0.5 mL via INTRAMUSCULAR
  Filled 2021-03-22: qty 0.5

## 2021-03-22 NOTE — ED Provider Notes (Signed)
[facility] EMERGENCY DEPT Provider Note   CSN: [id number] Arrival date & time: 03/22/21  1431     History Chief Complaint  Patient presents with  . Fall    Natasha [name] is a 50 y.o. female.  HPI   Pt sates she was using her wheelchair to go to the mailbox.  She ended up going to fast and fell out of her wheelchair.   Pt ended up landing on her right elbow.  She sustained a laceration and is having pain in her right elbow.   She denies loc or any pain elsewhere.  Past Medical History:  Diagnosis Date  . Anxiety   . C6 spinal cord injury (HCC)   . Depression     Patient Active Problem List   Diagnosis Date Noted  . Severe recurrent major depression without psychotic features (HCC) 10/16/2020  . MDD (major depressive disorder), recurrent episode, severe (HCC) 10/15/2020  . Right foot drop 10/13/2020  . Muscle spasticity 01/29/2020  . History of DVT (deep vein thrombosis) 05/11/2019  . Spinal cord injury, cervical region (HCC) 05/11/2019  . Paraplegia (HCC) 10/21/2017    Past Surgical History:  Procedure Laterality Date  . BACK SURGERY    . NECK SURGERY       OB History   No obstetric history on file.     Family History  Problem Relation Age of Onset  . Alcohol abuse Father   . Alcohol abuse Brother   . Depression Mother   . Alcohol abuse Mother   . Parkinson's disease Mother     Social History   Tobacco Use  . Smoking status: Former Smoker    Packs/day: 0.00  . Smokeless tobacco: Never Used  Vaping Use  . Vaping Use: Never used  Substance Use Topics  . Alcohol use: Yes    Alcohol/week: 0.0 standard drinks    Comment: Occasional use  . Drug use: No    Home Medications Prior to Admission medications   Medication Sig Start Date End Date Taking? Authorizing Provider  baclofen (LIORESAL) 10 MG tablet Take 1 tablet (10 mg total) by mouth 3 (three) times daily. 10/22/20   Clapacs, Jackquline Denmark, MD  busPIRone (BUSPAR) 10 MG tablet  Take 1 tablet (10 mg total) by mouth 3 (three) times daily. 10/22/20   Clapacs, Jackquline Denmark, MD  clonazePAM (KLONOPIN) 0.5 MG tablet Take 1 tablet (0.5 mg total) by mouth daily as needed for anxiety. 10/22/20   Clapacs, Jackquline Denmark, MD  DULoxetine (CYMBALTA) 60 MG capsule Take 2 capsules (120 mg total) by mouth daily. 10/22/20   Clapacs, Jackquline Denmark, MD  hydrOXYzine (ATARAX/VISTARIL) 25 MG tablet Take 1 tablet (25 mg total) by mouth every 6 (six) hours as needed for anxiety. 10/22/20   Clapacs, Jackquline Denmark, MD  meloxicam (MOBIC) 15 MG tablet Take 1 tablet (15 mg total) by mouth daily. 10/22/20   Clapacs, Jackquline Denmark, MD  QUEtiapine (SEROQUEL) 100 MG tablet Take 1 tablet (100 mg total) by mouth at bedtime. 10/22/20   Clapacs, Jackquline Denmark, MD    Allergies    Patient has no known allergies.  Review of Systems   Review of Systems  All other systems reviewed and are negative.   Physical Exam Updated Vital Signs BP (!) 149/92 (BP Location: Left Arm)   Pulse 87   Temp 98.2 F (36.8 C) (Oral)   Resp 18   Ht 1.549 m (5\' 1" )   Wt 70.3 kg   LMP 03/08/2021  SpO2 99%   BMI 29.29 kg/m   Physical Exam Vitals and nursing note reviewed.  Constitutional:      General: She is not in acute distress.    Appearance: She is well-developed.  HENT:     Head: Normocephalic and atraumatic.     Right Ear: External ear normal.     Left Ear: External ear normal.  Eyes:     General: No scleral icterus.       Right eye: No discharge.        Left eye: No discharge.     Conjunctiva/sclera: Conjunctivae normal.  Neck:     Trachea: No tracheal deviation.  Cardiovascular:     Rate and Rhythm: Normal rate.  Pulmonary:     Effort: Pulmonary effort is normal. No respiratory distress.     Breath sounds: No stridor.  Abdominal:     General: There is no distension.  Musculoskeletal:        General: No swelling or deformity.     Cervical back: Neck supple.     Comments: Laceration right olecranon process, mild ttp, no deformity, no  wrist or shoulder ttp  Skin:    General: Skin is warm and dry.     Findings: No rash.  Neurological:     Mental Status: She is alert.     Cranial Nerves: Cranial nerve deficit: no gross deficits.     Comments: paraplegia     ED Results / Procedures / Treatments   Labs (all labs ordered are listed, but only abnormal results are displayed) Labs Reviewed - No data to display  EKG None  Radiology DG Elbow Complete Right  Result Date: 03/22/2021 CLINICAL DATA:  Pain following fall EXAM: RIGHT ELBOW - COMPLETE 3+ VIEW COMPARISON:  None. FINDINGS: Frontal, bilateral oblique, and lateral views were obtained. No fracture or dislocation. No appreciable joint effusion. No joint space narrowing or erosion. IMPRESSION: No fracture or dislocation.  No appreciable arthropathic change. Electronically Signed   By: Bretta Bang III M.D.   On: 03/22/2021 16:06    Procedures .[name] KitchenLaceration Repair  Date/Time: 03/22/2021 4:38 PM Performed by: Linwood Dibbles, MD Authorized by: Linwood Dibbles, MD   Consent:    Consent obtained:  Verbal   Consent given by:  Patient   Risks discussed:  Infection, need for additional repair, pain, poor cosmetic result and poor wound healing   Alternatives discussed:  No treatment and delayed treatment Universal protocol:    Procedure explained and questions answered to patient or proxy's satisfaction: yes     Relevant documents present and verified: yes     Test results available: yes     Imaging studies available: yes     Required blood products, implants, devices, and special equipment available: yes     Site/side marked: yes     Immediately prior to procedure, a time out was called: yes     Patient identity confirmed:  Verbally with patient Anesthesia:    Anesthesia method:  Local infiltration   Local anesthetic:  Lidocaine 1% WITH epi Laceration details:    Location: Right elbow.   Length (cm):  2 Pre-procedure details:    Preparation:  Patient was prepped  and draped in usual sterile fashion Exploration:    Wound extent: no nerve damage noted, no underlying fracture noted and no vascular damage noted     Contaminated: no   Treatment:    Area cleansed with:  Chlorhexidine   Amount of cleaning:  Standard  Irrigation method:  Pressure wash   Visualized foreign bodies/material removed: no     Debridement:  None Skin repair:    Repair method:  Sutures   Suture size:  4-0   Suture material:  Prolene   Number of sutures:  4 Approximation:    Approximation:  Close     Medications Ordered in ED Medications  bacitracin ointment 1 application (has no administration in time range)  Tdap (BOOSTRIX) injection 0.5 mL (0.5 mLs Intramuscular Given 03/22/21 1607)  lidocaine-EPINEPHrine (XYLOCAINE W/EPI) 2 %-1:200000 (PF) injection 10 mL (10 mLs Infiltration Given by Other 03/22/21 1608)    ED Course  I have reviewed the triage vital signs and the nursing notes.  Pertinent labs & imaging results that were available during my care of the patient were reviewed by me and considered in my medical decision making (see chart for details).    MDM Rules/Calculators/A&P                          Patient presented to the emergency room for evaluation of an elbow injury.  Patient did have a laceration over the olecranon bursa.  X-ray does not show any fracture.  Patient was sutured without difficulty.  Stable for discharge. Final Clinical Impression(s) / ED Diagnoses Final diagnoses:  Laceration of right elbow, initial encounter    Rx / DC Orders ED Discharge Orders    None       Linwood Dibbles, MD 03/22/21 [id number]

## 2021-03-22 NOTE — ED Triage Notes (Signed)
Pt reports that she flipped her electric wheelchair today while going to the mailbox today. Landed on concrete. denies hitting head. Pt is noted to have a laceration to right elbow. Bleeding controlled.

## 2021-03-22 NOTE — Discharge Instructions (Signed)
Suture removal in 10 days

## 2021-04-16 ENCOUNTER — Ambulatory Visit: Payer: Medicare Other

## 2021-05-20 ENCOUNTER — Other Ambulatory Visit: Payer: Self-pay

## 2021-05-20 ENCOUNTER — Ambulatory Visit
Admission: RE | Admit: 2021-05-20 | Discharge: 2021-05-20 | Disposition: A | Discharge status: Home / Self Care | Payer: Medicare Other | Source: Ambulatory Visit | Attending: Physician Assistant | Admitting: Physician Assistant

## 2021-05-20 DIAGNOSIS — Z1231 Encounter for screening mammogram for malignant neoplasm of breast: Secondary | ICD-10-CM

## 2021-05-22 IMAGING — US ULTRASOUND RIGHT BREAST LIMITED
1 series · 5 of 5 positions shown · non-contrast
Comparison: None available

CLINICAL DATA: 48-year-old patient presents for annual mammogram
and evaluation of a palpable lump in the right axilla. She has also
had right greater than left breast pain. She describes the right
breast pain in the inferior central right breast, occasional
stinging sensation. Her periods have changed somewhat recently and
she states it may be possible she is beginning menopause.

EXAM:
DIGITAL DIAGNOSTIC BILATERAL MAMMOGRAM WITH CAD AND TOMO
ULTRASOUND RIGHT BREAST

[Series 1: ultrasound right breast limited · 0.06mm/px · 5 of 5 slices shown]
[im 1/5]
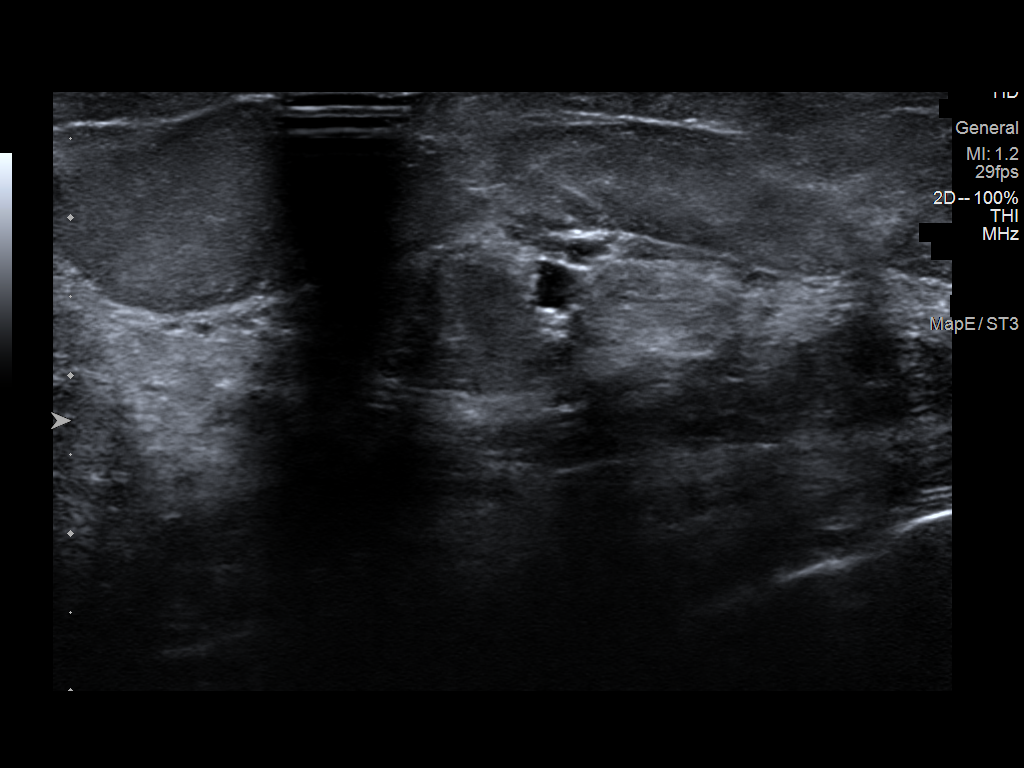
[im 2/5]
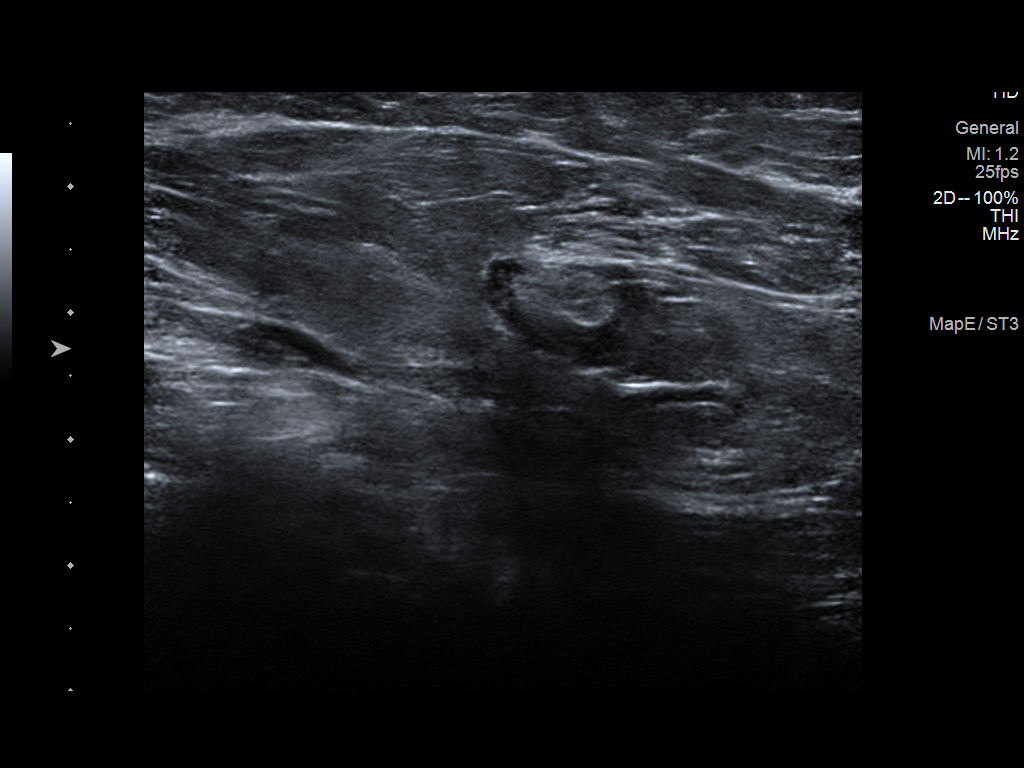
[im 3/5]
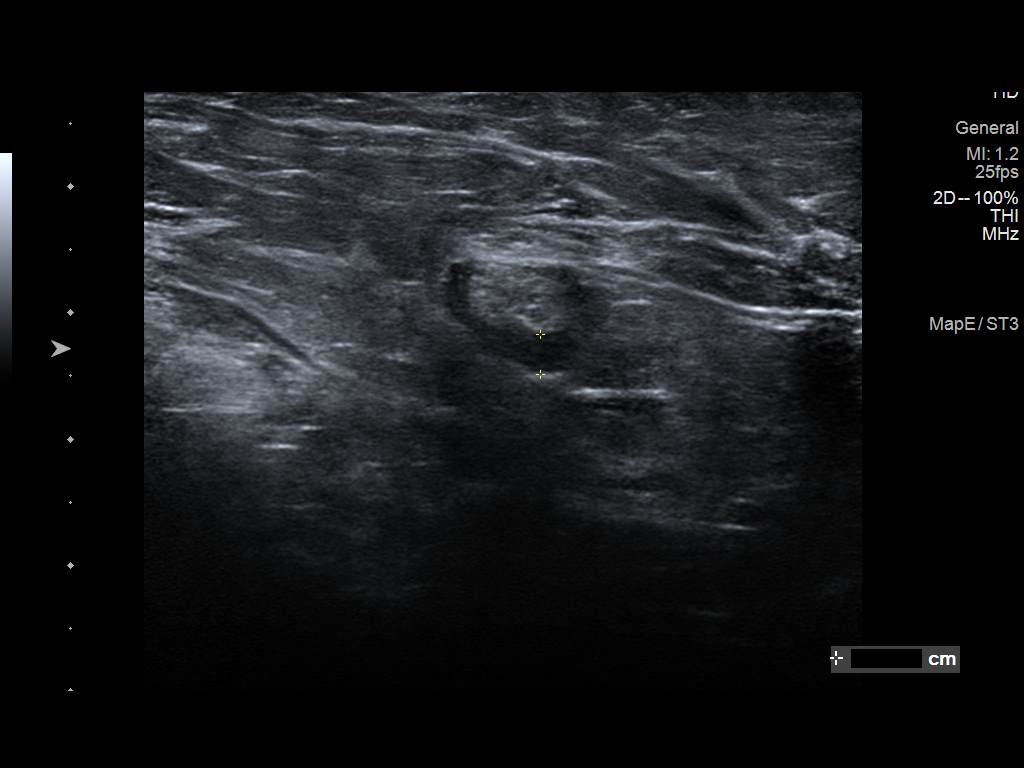
[im 4/5]
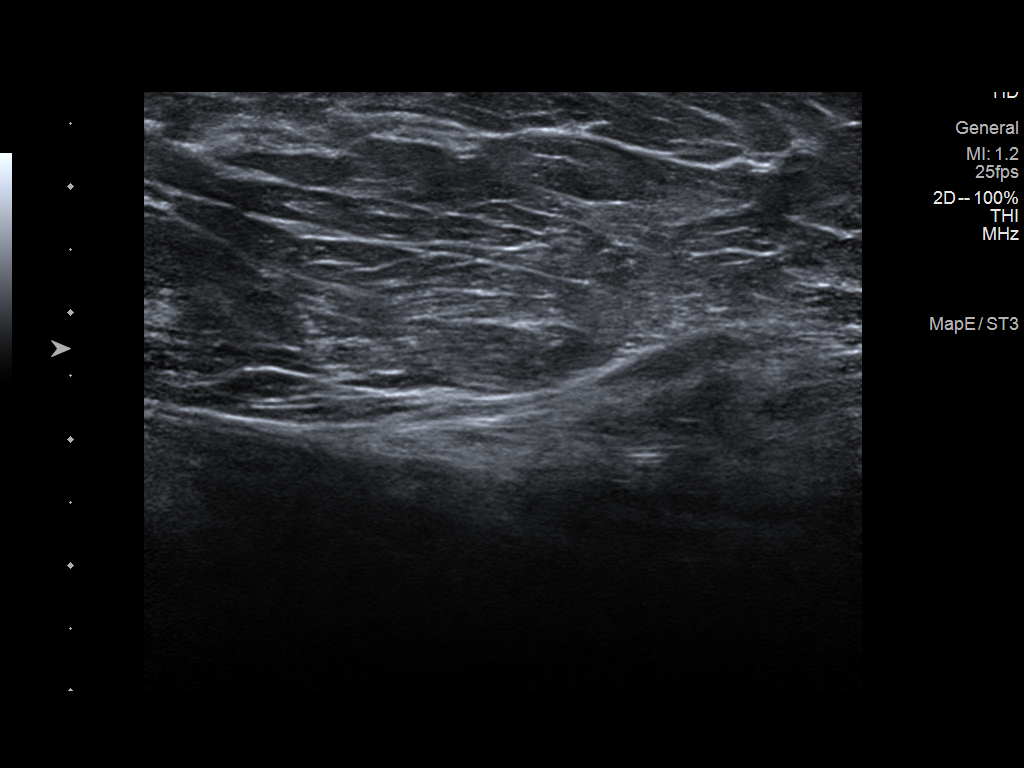
[im 5/5]
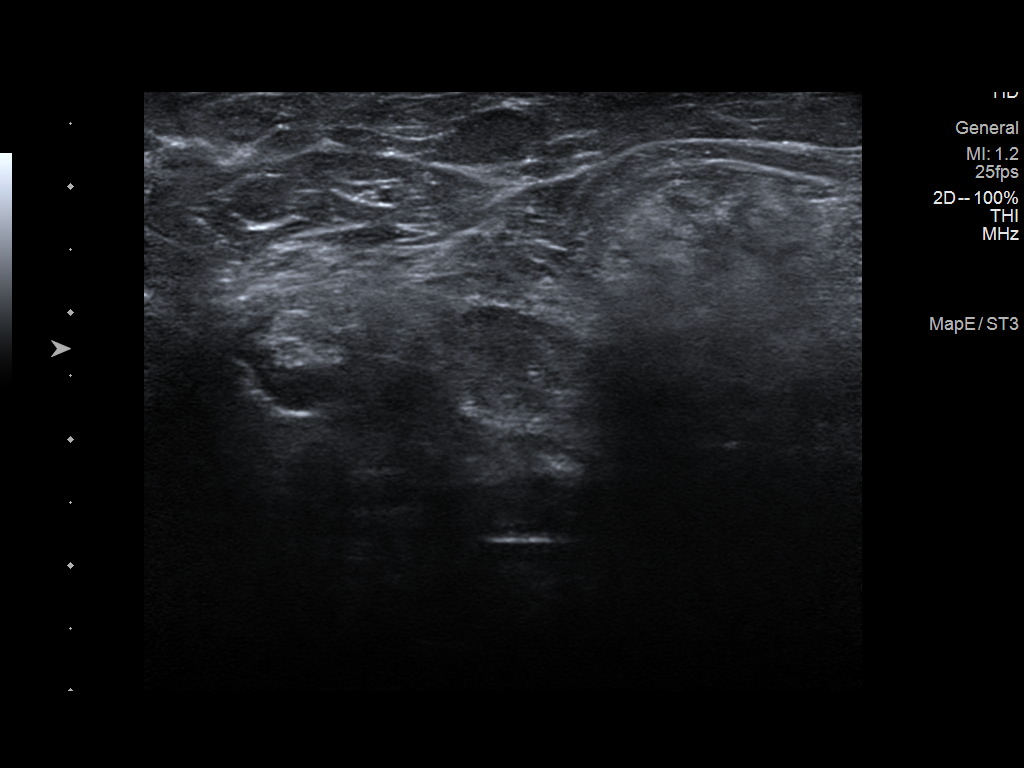

[5 of 5 positions shown; findings below may reference images not displayed]

ACR Breast Density Category c: The breast tissue is heterogeneously
dense, which may obscure small masses.
FINDINGS: No mass, architectural distortion, or suspicious microcalcification
is identified to suggest malignancy in either breast. Metallic skin
marker was placed over the right axilla in the region of patient
concern. A normal right axillary lymph node is seen in this region.
No adenopathy or suspicious mass is seen on spot tangential view of
the right axillary region.

Mammographic images were processed with CAD.

On physical exam, no mass is palpated in the right axilla or in the
inferior right breast.

Targeted ultrasound is performed, showing normal right axillary
lymph nodes.

Ultrasound of the inferior right breast shows incidental tiny benign
cystic area at 6 o'clock position 1 cm from the nipple. No dominant
cyst, solid mass, duct ectasia, or abnormal shadowing. Normal skin
thickness.
IMPRESSION: No evidence of malignancy in either breast. Question breast pain
related to perimenopause.

RECOMMENDATION:
Screening mammogram in one year.(Code:[id number]). The patient is
instructed to return at any time for further evaluation if regions
of concern worsen, or she develops new areas of concern.

I have discussed the findings and recommendations with the patient.
Results were also provided in writing at the conclusion of the
visit. If applicable, a reminder letter will be sent to the patient
regarding the next appointment.

BI-RADS CATEGORY  2: Benign.

## 2021-08-01 ENCOUNTER — Emergency Department (HOSPITAL_BASED_OUTPATIENT_CLINIC_OR_DEPARTMENT_OTHER)
Admission: EM | Admit: 2021-08-01 | Discharge: 2021-08-01 | Disposition: A | Discharge status: Home / Self Care | Payer: Medicare Other | Attending: Emergency Medicine | Admitting: Emergency Medicine

## 2021-08-01 ENCOUNTER — Emergency Department (HOSPITAL_BASED_OUTPATIENT_CLINIC_OR_DEPARTMENT_OTHER): Payer: Medicare Other

## 2021-08-01 ENCOUNTER — Other Ambulatory Visit: Payer: Self-pay

## 2021-08-01 ENCOUNTER — Encounter (HOSPITAL_BASED_OUTPATIENT_CLINIC_OR_DEPARTMENT_OTHER): Payer: Self-pay

## 2021-08-01 DIAGNOSIS — Y92009 Unspecified place in unspecified non-institutional (private) residence as the place of occurrence of the external cause: Secondary | ICD-10-CM | POA: Insufficient documentation

## 2021-08-01 DIAGNOSIS — R55 Syncope and collapse: Secondary | ICD-10-CM

## 2021-08-01 DIAGNOSIS — W19XXXA Unspecified fall, initial encounter: Secondary | ICD-10-CM

## 2021-08-01 DIAGNOSIS — S01112A Laceration without foreign body of left eyelid and periocular area, initial encounter: Secondary | ICD-10-CM | POA: Diagnosis not present

## 2021-08-01 DIAGNOSIS — S0181XA Laceration without foreign body of other part of head, initial encounter: Secondary | ICD-10-CM

## 2021-08-01 DIAGNOSIS — Z87891 Personal history of nicotine dependence: Secondary | ICD-10-CM | POA: Diagnosis not present

## 2021-08-01 DIAGNOSIS — S0990XA Unspecified injury of head, initial encounter: Secondary | ICD-10-CM | POA: Diagnosis present

## 2021-08-01 DIAGNOSIS — W010XXA Fall on same level from slipping, tripping and stumbling without subsequent striking against object, initial encounter: Secondary | ICD-10-CM | POA: Diagnosis not present

## 2021-08-01 LAB — BASIC METABOLIC PANEL WITH GFR
Anion gap: 9 (ref 5–15)
BUN: 11 mg/dL (ref 6–20)
CO2: 25 mmol/L (ref 22–32)
Calcium: 9.4 mg/dL (ref 8.9–10.3)
Chloride: 103 mmol/L (ref 98–111)
Creatinine, Ser: 0.84 mg/dL (ref 0.44–1.00)
GFR, Estimated: >60 mL/min
Glucose, Bld: 89 mg/dL (ref 70–99)
Potassium: 4.4 mmol/L (ref 3.5–5.1)
Sodium: 137 mmol/L (ref 135–145)

## 2021-08-01 LAB — CBC WITH DIFFERENTIAL/PLATELET
Abs Immature Granulocytes: 0.03 10*3/uL (ref 0.00–0.07)
Basophils Absolute: 0.1 10*3/uL (ref 0.0–0.1)
Basophils Relative: 1 %
Eosinophils Absolute: 0 10*3/uL (ref 0.0–0.5)
Eosinophils Relative: 0 %
HCT: 43 % (ref 36.0–46.0)
Hemoglobin: 13.5 g/dL (ref 12.0–15.0)
Immature Granulocytes: 0 %
Lymphocytes Relative: 21 %
Lymphs Abs: 2.4 10*3/uL (ref 0.7–4.0)
MCH: 26.3 pg (ref 26.0–34.0)
MCHC: 31.4 g/dL (ref 30.0–36.0)
MCV: 83.7 fL (ref 80.0–100.0)
Monocytes Absolute: 0.7 10*3/uL (ref 0.1–1.0)
Monocytes Relative: 6 %
Neutro Abs: 8.5 10*3/uL — ABNORMAL HIGH (ref 1.7–7.7)
Neutrophils Relative %: 72 %
Platelets: 367 10*3/uL (ref 150–400)
RBC: 5.14 MIL/uL — ABNORMAL HIGH (ref 3.87–5.11)
RDW: 15.6 % — ABNORMAL HIGH (ref 11.5–15.5)
WBC: 11.8 10*3/uL — ABNORMAL HIGH (ref 4.0–10.5)
nRBC: 0 % (ref 0.0–0.2)

## 2021-08-01 MED ORDER — LIDOCAINE-EPINEPHRINE (PF) 2 %-1:200000 IJ SOLN
10.0000 mL | Freq: Once | INTRAMUSCULAR | Status: AC
  Administered 2021-08-01: 10 mL
  Filled 2021-08-01: qty 20

## 2021-08-01 NOTE — Discharge Instructions (Addendum)
We saw you in the ER for your WOUND. °Please read the instructions provided on wound care. °Keep the area clean and dry, apply bacitracin ointment daily and take the medications provided. °RETURN TO THE ER IF THERE IS INCREASED PAIN, REDNESS, PUS COMING OUT from the wound site.  °

## 2021-08-01 NOTE — ED Notes (Signed)
Patient transported to CT 

## 2021-08-01 NOTE — ED Provider Notes (Signed)
[facility] EMERGENCY DEPT Provider Note   CSN: [id number] Arrival date & time: 08/01/21  1626     History Chief Complaint  Patient presents with   Fall   Head Laceration    Natasha [name] is a 50 y.o. female.  HPI     50 year old female with history of depression and C6 spinal cord injury comes in with chief complaint of fall.  Patient reports that she has been feeling depressed for the last few days, and has not really eaten well during that period.  This afternoon, she was in the bathroom when she collapsed and fell.  She thinks she might have fainted but not 100% sure.  She had no warning before the episode.  Patient has no cardiac history and denies any family history of premature CAD.  She has no history of syncope before.  Patient has laceration to her scalp and has frontal headache. Pt has no associated nausea, vomiting, seizures, loss of consciousness or new visual complains, weakness, numbness, dizziness or gait instability. No SI, Hi and reports that she will follow-up with her psychiatry and has a plan if her symptoms get worse.  Past Medical History:  Diagnosis Date   Anxiety    C6 spinal cord injury [facility])    Depression     Patient Active Problem List   Diagnosis Date Noted   Severe recurrent major depression without psychotic features (HCC) 10/16/2020   MDD (major depressive disorder), recurrent episode, severe (HCC) 10/15/2020   Right foot drop 10/13/2020   Muscle spasticity 01/29/2020   History of DVT (deep vein thrombosis) 05/11/2019   Spinal cord injury, cervical region (HCC) 05/11/2019   Paraplegia (HCC) 10/21/2017    Past Surgical History:  Procedure Laterality Date   BACK SURGERY     NECK SURGERY       OB History   No obstetric history on file.     Family History  Problem Relation Age of Onset   Alcohol abuse Father    Alcohol abuse Brother    Depression Mother    Alcohol abuse Mother    Parkinson's disease Mother      Social History   Tobacco Use   Smoking status: Former    Packs/day: 0.00    Types: Cigarettes   Smokeless tobacco: Never  Vaping Use   Vaping Use: Never used  Substance Use Topics   Alcohol use: Yes    Alcohol/week: 0.0 standard drinks    Comment: Occasional use   Drug use: No    Home Medications Prior to Admission medications   Medication Sig Start Date End Date Taking? Authorizing Provider  baclofen (LIORESAL) 10 MG tablet Take 1 tablet (10 mg total) by mouth 3 (three) times daily. 10/22/20   Clapacs, Jackquline Denmark, MD  busPIRone (BUSPAR) 10 MG tablet Take 1 tablet (10 mg total) by mouth 3 (three) times daily. 10/22/20   Clapacs, Jackquline Denmark, MD  clonazePAM (KLONOPIN) 0.5 MG tablet Take 1 tablet (0.5 mg total) by mouth daily as needed for anxiety. 10/22/20   Clapacs, Jackquline Denmark, MD  DULoxetine (CYMBALTA) 60 MG capsule Take 2 capsules (120 mg total) by mouth daily. 10/22/20   Clapacs, Jackquline Denmark, MD  hydrOXYzine (ATARAX/VISTARIL) 25 MG tablet Take 1 tablet (25 mg total) by mouth every 6 (six) hours as needed for anxiety. 10/22/20   Clapacs, Jackquline Denmark, MD  meloxicam (MOBIC) 15 MG tablet Take 1 tablet (15 mg total) by mouth daily. 10/22/20   Clapacs, Jackquline Denmark,  MD  QUEtiapine (SEROQUEL) 100 MG tablet Take 1 tablet (100 mg total) by mouth at bedtime. 10/22/20   Clapacs, Jackquline Denmark, MD    Allergies    Patient has no known allergies.  Review of Systems   Review of Systems  Constitutional:  Positive for activity change.  Eyes:  Negative for visual disturbance.  Gastrointestinal:  Negative for nausea and vomiting.  Skin:  Positive for wound.  Neurological:  Positive for headaches.   Physical Exam Updated Vital Signs BP (!) 143/111 (BP Location: Right Arm)   Pulse 89   Temp 98.4 F (36.9 C) (Oral)   Resp 20   LMP  (LMP Unknown)   SpO2 100%   Physical Exam Vitals and nursing note reviewed.  Constitutional:      Appearance: She is well-developed.  HENT:     Head: Normocephalic and atraumatic.   Eyes:     Pupils: Pupils are equal, round, and reactive to light.  Neck:     Comments: No midline c-spine tenderness Cardiovascular:     Rate and Rhythm: Normal rate and regular rhythm.     Pulses: Normal pulses.  Pulmonary:     Effort: Pulmonary effort is normal. No respiratory distress.     Breath sounds: Normal breath sounds.  Chest:     Chest wall: No tenderness.  Abdominal:     General: Bowel sounds are normal. There is no distension.     Palpations: Abdomen is soft.     Tenderness: There is no abdominal tenderness.  Musculoskeletal:     Cervical back: Neck supple.     Comments: No long bone tenderness - upper and lower extrmeities and no pelvic pain, instability.  Skin:    General: Skin is warm and dry.     Findings: Erythema present. No rash.     Comments: 1.5 cm laceration over the left eyebrow  Neurological:     Mental Status: She is alert and oriented to person, place, and time.     Cranial Nerves: No cranial nerve deficit.    ED Results / Procedures / Treatments   Labs (all labs ordered are listed, but only abnormal results are displayed) Labs Reviewed  CBC WITH DIFFERENTIAL/PLATELET - Abnormal; Notable for the following components:      Result Value   WBC 11.8 (*)    RBC 5.14 (*)    RDW 15.6 (*)    Neutro Abs 8.5 (*)    All other components within normal limits  BASIC METABOLIC PANEL    EKG EKG Interpretation  Date/Time:  Saturday August 01 2021 20:28:45 EDT Ventricular Rate:  76 PR Interval:  152 QRS Duration: 90 QT Interval:  361 QTC Calculation: 406 R Axis:   37 Text Interpretation: Sinus rhythm Borderline low voltage, extremity leads No acute changes No significant change since last tracing Confirmed by Derwood Kaplan ([id number]) on 08/01/2021 8:48:57 PM  Radiology CT Head Wo Contrast  Result Date: 08/01/2021 CLINICAL DATA:  Head trauma, moderate to severe. Tripped and fell at home. Laceration to the left eyebrow EXAM: CT HEAD WITHOUT CONTRAST  TECHNIQUE: Contiguous axial images were obtained from the base of the skull through the vertex without intravenous contrast. COMPARISON:  10/21/2017 FINDINGS: Brain: No evidence of acute infarction, hemorrhage, hydrocephalus, extra-axial collection or mass lesion/mass effect. Vascular: No hyperdense vessel or unexpected calcification. Skull: Normal. Negative for fracture or focal lesion. Sinuses/Orbits: No acute finding. Other: None. IMPRESSION: No acute intracranial abnormalities. Electronically Signed   By: Burman Nieves  M.D.   On: 08/01/2021 20:27    Procedures .[name] Kitchen.Laceration Repair  Date/Time: 08/01/2021 9:42 PM Performed by: Derwood KaplanNanavati, Bonifacio Pruden, MD Authorized by: Derwood KaplanNanavati, Cleda Imel, MD   Consent:    Consent obtained:  Verbal   Consent given by:  Patient   Risks, benefits, and alternatives were discussed: yes     Risks discussed:  Infection, pain, poor cosmetic result, poor wound healing, need for additional repair and nerve damage   Alternatives discussed:  No treatment Universal protocol:    Procedure explained and questions answered to patient or proxy's satisfaction: yes     Patient identity confirmed:  Arm band Laceration details:    Location:  Face   Face location:  L eyebrow   Length (cm):  1.5   Depth (mm):  2 Pre-procedure details:    Preparation:  Patient was prepped and draped in usual sterile fashion Exploration:    Limited defect created (wound extended): no     Hemostasis achieved with:  Direct pressure   Imaging outcome: foreign body not noted     Wound exploration: entire depth of wound visualized     Wound extent: fascia violated     Contaminated: no   Treatment:    Area cleansed with:  Saline   Amount of cleaning:  Standard   Irrigation solution:  Sterile saline   Irrigation volume:  50   Irrigation method:  Syringe   Debridement:  None   Scar revision: no   Skin repair:    Repair method:  Sutures   Suture size:  5-0   Suture material:  Nylon   Suture  technique:  Simple interrupted   Number of sutures:  2 Approximation:    Approximation:  Close Repair type:    Repair type:  Complex (Because it is a facial laceration) Post-procedure details:    Dressing:  Sterile dressing   Procedure completion:  Tolerated well, no immediate complications   Medications Ordered in ED Medications  lidocaine-EPINEPHrine (XYLOCAINE W/EPI) 2 %-1:200000 (PF) injection 10 mL (10 mLs Infiltration Given 08/01/21 2045)    ED Course  I have reviewed the triage vital signs and the nursing notes.  Pertinent labs & imaging results that were available during my care of the patient were reviewed by me and considered in my medical decision making (see chart for details).    MDM Rules/Calculators/A&P                            50 year old female comes in with chief complaint of syncope and fall.  At triage she reported tripping.  To me she reports that she might have fainted.  She has no cardiac history.  EKG and basic labs ordered.  Patient does indicate that she has been feeling little depressed.  No HI, SI.  However she has not been eating or drinking much because of it.  It is possible that she had orthostatic type event.  She does not have any nausea or vomiting and does not need p.o. challenge.  Just needs to push fluids.  Patient informs me that she does not need any psychiatric help right now.  That she will push fluids at home and has a plan in case her symptoms get worse.  She has a small laceration to the forehead around her left eyebrow.  Laceration was repaired without any complications.  Advised to follow-up with her PCP for suture removal.  Strict ER return precautions discussed and she  will come back to [place] if she has another episode of syncope or palpitations.  She will report to her PCP if she has recurrent palpitations without symptoms to see if she is a candidate for Holter monitoring.  If patient did have syncope, suspicion for cardiogenic event is  low given lack of any risk factors.  Given that she is on antidepressants, EKG was ordered and does not show any abnormal intervals.  Final Clinical Impression(s) / ED Diagnoses Final diagnoses:  Fall, initial encounter  Syncope and collapse  Facial laceration, initial encounter    Rx / DC Orders ED Discharge Orders     None        Derwood Kaplan, MD 08/01/21 2147

## 2021-08-01 NOTE — ED Triage Notes (Signed)
She tells [place] she tripped (didn't pass out) at home and fell. She has 1 c.m. lac. At inner left eyebrow area, which is not bleeding at present. She denies any other injury from her fall and tells me she is "not really having any pain".

## 2021-11-02 ENCOUNTER — Emergency Department (HOSPITAL_COMMUNITY): Payer: Medicare Other

## 2021-11-02 ENCOUNTER — Observation Stay (HOSPITAL_BASED_OUTPATIENT_CLINIC_OR_DEPARTMENT_OTHER)
Admission: EM | Admit: 2021-11-02 | Discharge: 2021-11-04 | Disposition: A | Discharge status: Home / Self Care | Payer: Medicare Other | Attending: Emergency Medicine | Admitting: Emergency Medicine

## 2021-11-02 ENCOUNTER — Other Ambulatory Visit: Payer: Self-pay

## 2021-11-02 ENCOUNTER — Emergency Department (HOSPITAL_BASED_OUTPATIENT_CLINIC_OR_DEPARTMENT_OTHER): Payer: Medicare Other

## 2021-11-02 ENCOUNTER — Encounter (HOSPITAL_BASED_OUTPATIENT_CLINIC_OR_DEPARTMENT_OTHER): Payer: Self-pay

## 2021-11-02 DIAGNOSIS — Z9181 History of falling: Secondary | ICD-10-CM | POA: Diagnosis not present

## 2021-11-02 DIAGNOSIS — Z20822 Contact with and (suspected) exposure to covid-19: Secondary | ICD-10-CM | POA: Insufficient documentation

## 2021-11-02 DIAGNOSIS — R531 Weakness: Principal | ICD-10-CM | POA: Insufficient documentation

## 2021-11-02 DIAGNOSIS — Z87891 Personal history of nicotine dependence: Secondary | ICD-10-CM | POA: Insufficient documentation

## 2021-11-02 DIAGNOSIS — R29898 Other symptoms and signs involving the musculoskeletal system: Secondary | ICD-10-CM | POA: Diagnosis present

## 2021-11-02 DIAGNOSIS — F332 Major depressive disorder, recurrent severe without psychotic features: Secondary | ICD-10-CM | POA: Diagnosis present

## 2021-11-02 DIAGNOSIS — Z79899 Other long term (current) drug therapy: Secondary | ICD-10-CM | POA: Insufficient documentation

## 2021-11-02 DIAGNOSIS — M791 Myalgia, unspecified site: Secondary | ICD-10-CM

## 2021-11-02 DIAGNOSIS — S14109A Unspecified injury at unspecified level of cervical spinal cord, initial encounter: Secondary | ICD-10-CM | POA: Diagnosis present

## 2021-11-02 LAB — URINALYSIS, ROUTINE W REFLEX MICROSCOPIC
Bilirubin Urine: NEGATIVE
Glucose, UA: NEGATIVE mg/dL
Hgb urine dipstick: NEGATIVE
Ketones, ur: 15 mg/dL — AB
Leukocytes,Ua: NEGATIVE
Nitrite: NEGATIVE
Protein, ur: NEGATIVE mg/dL
Specific Gravity, Urine: 1.021 (ref 1.005–1.030)
pH: 5.5 (ref 5.0–8.0)

## 2021-11-02 LAB — CBC
HCT: 37.6 % (ref 36.0–46.0)
Hemoglobin: 11.9 g/dL — ABNORMAL LOW (ref 12.0–15.0)
MCH: 26.1 pg (ref 26.0–34.0)
MCHC: 31.6 g/dL (ref 30.0–36.0)
MCV: 82.5 fL (ref 80.0–100.0)
Platelets: 306 10*3/uL (ref 150–400)
RBC: 4.56 MIL/uL (ref 3.87–5.11)
RDW: 15.6 % — ABNORMAL HIGH (ref 11.5–15.5)
WBC: 8.9 10*3/uL (ref 4.0–10.5)
nRBC: 0 % (ref 0.0–0.2)

## 2021-11-02 LAB — BASIC METABOLIC PANEL
Anion gap: 8 (ref 5–15)
BUN: 16 mg/dL (ref 6–20)
CO2: 26 mmol/L (ref 22–32)
Calcium: 9.5 mg/dL (ref 8.9–10.3)
Chloride: 104 mmol/L (ref 98–111)
Creatinine, Ser: 0.68 mg/dL (ref 0.44–1.00)
GFR, Estimated: >60 mL/min (ref >60)
Glucose, Bld: 92 mg/dL (ref 70–99)
Potassium: 4 mmol/L (ref 3.5–5.1)
Sodium: 138 mmol/L (ref 135–145)

## 2021-11-02 LAB — SEDIMENTATION RATE: Sed Rate: 14 mm/hr (ref 0–22)

## 2021-11-02 LAB — RESP PANEL BY RT-PCR (FLU A&B, COVID) ARPGX2
Influenza A by PCR: NEGATIVE
Influenza B by PCR: NEGATIVE
SARS Coronavirus 2 by RT PCR: NEGATIVE

## 2021-11-02 LAB — LACTIC ACID, PLASMA: Lactic Acid, Venous: 0.7 mmol/L (ref 0.5–1.9)

## 2021-11-02 LAB — C-REACTIVE PROTEIN: CRP: 1.3 mg/dL — ABNORMAL HIGH (ref <1.0)

## 2021-11-02 LAB — CK: Total CK: 85 U/L (ref 38–234)

## 2021-11-02 MED ORDER — DIAZEPAM 5 MG/ML IJ SOLN
5.0000 mg | Freq: Once | INTRAMUSCULAR | Status: AC
  Administered 2021-11-02: 5 mg via INTRAVENOUS
  Filled 2021-11-02: qty 2

## 2021-11-02 MED ORDER — ONDANSETRON HCL 4 MG/2ML IJ SOLN
INTRAMUSCULAR | Status: AC
  Administered 2021-11-02: 4 mg
  Filled 2021-11-02: qty 2

## 2021-11-02 MED ORDER — FENTANYL CITRATE PF 50 MCG/ML IJ SOSY
PREFILLED_SYRINGE | INTRAMUSCULAR | Status: AC
  Administered 2021-11-02: 50 ug
  Filled 2021-11-02: qty 1

## 2021-11-02 MED ORDER — FENTANYL CITRATE PF 50 MCG/ML IJ SOSY
50.0000 ug | PREFILLED_SYRINGE | Freq: Once | INTRAMUSCULAR | Status: AC
  Administered 2021-11-02: 50 ug via INTRAVENOUS
  Filled 2021-11-02: qty 1

## 2021-11-02 MED ORDER — GADOBUTROL 1 MMOL/ML IV SOLN
7.0000 mL | Freq: Once | INTRAVENOUS | Status: AC | PRN
  Administered 2021-11-02: 7 mL via INTRAVENOUS

## 2021-11-02 NOTE — ED Provider Notes (Signed)
PMHX sig for remote incomplete C6 spinal cord injury Presented from [facility] w/ myalgias, increased spasticity COVID/flu neg, CXR showed no signs of infxn Inflammatory markers reassuring, lower concern for myositis UA neg, low concern for UTI, pyelo Lactic acid nml, not tachycardic, low concern for sepsis Sent for MRI C spine to eval for progressive myelopathy Afebrile, vitally stable on arrival. Currently pain controlled. MRI C spine ordered.  MR concerning for age indeterminate hardware malfxn w/ violation of the posterior cord by ACDF screws. Discussed w/ nsg. Will evaluate patient and review images. Dispo recs pending. Care of this pt transitioned to the oncoming provider at 0000 awaiting nsg recs.    Lutricia Feil, MD 11/02/21 2357    Gwyneth Sprout, MD 11/03/21 2300

## 2021-11-02 NOTE — ED Provider Notes (Signed)
[facility]ENTER [facility] EMERGENCY DEPT Provider Note   CSN: [id number] Arrival date & time: 11/02/21  1129     History Chief Complaint  Patient presents with   Generalized Body Aches    Natasha [name] is a 50 y.o. female with a past medical history of incomplete C6 spinal cord injury many years ago who presents emergency department with complaint of weakness and generalized myalgias.  Patient states that over the past 2 days she has had severe and progressively worsening pain and weakness in her muscles.  She states that normally she lives independently, she is able to stand, transfer and walk.  She has chronic hypertonicity in her arms legs and ankles.  She states that her pain has been so severe she is having severe difficulty completing ADLs.  Today she was so weak and in so much pain she was unable to stand off of her couch.  She states she is never had anything like this before.  She denies any urinary symptoms such as burning with urination, blood in urine.  She does have frequency and weak stream which is a result of her neck injury.  Patient also has had associated shaking chills.  She has had a mild cough but that has been going on for several weeks.  She denies shortness of breath, nausea, vomiting, abdominal pain, recent injuries or procedures to her spine.  HPI     Past Medical History:  Diagnosis Date   Anxiety    C6 spinal cord injury [facility])    Depression     Patient Active Problem List   Diagnosis Date Noted   Severe recurrent major depression without psychotic features (HCC) 10/16/2020   MDD (major depressive disorder), recurrent episode, severe (HCC) 10/15/2020   Right foot drop 10/13/2020   Muscle spasticity 01/29/2020   History of DVT (deep vein thrombosis) 05/11/2019   Spinal cord injury, cervical region (HCC) 05/11/2019   Paraplegia (HCC) 10/21/2017    Past Surgical History:  Procedure Laterality Date   BACK SURGERY     NECK SURGERY       OB  History   No obstetric history on file.     Family History  Problem Relation Age of Onset   Alcohol abuse Father    Alcohol abuse Brother    Depression Mother    Alcohol abuse Mother    Parkinson's disease Mother     Social History   Tobacco Use   Smoking status: Former    Packs/day: 0.00    Types: Cigarettes   Smokeless tobacco: Never  Vaping Use   Vaping Use: Never used  Substance Use Topics   Alcohol use: Yes    Alcohol/week: 0.0 standard drinks    Comment: Occasional use   Drug use: No    Home Medications Prior to Admission medications   Medication Sig Start Date End Date Taking? Authorizing Provider  baclofen (LIORESAL) 10 MG tablet Take 1 tablet (10 mg total) by mouth 3 (three) times daily. 10/22/20   Clapacs, Jackquline Denmark, MD  busPIRone (BUSPAR) 10 MG tablet Take 1 tablet (10 mg total) by mouth 3 (three) times daily. 10/22/20   Clapacs, Jackquline Denmark, MD  clonazePAM (KLONOPIN) 0.5 MG tablet Take 1 tablet (0.5 mg total) by mouth daily as needed for anxiety. 10/22/20   Clapacs, Jackquline Denmark, MD  DULoxetine (CYMBALTA) 60 MG capsule Take 2 capsules (120 mg total) by mouth daily. 10/22/20   Clapacs, Jackquline Denmark, MD  hydrOXYzine (ATARAX/VISTARIL) 25 MG tablet Take  1 tablet (25 mg total) by mouth every 6 (six) hours as needed for anxiety. 10/22/20   Clapacs, Madie Reno, MD  meloxicam (MOBIC) 15 MG tablet Take 1 tablet (15 mg total) by mouth daily. 10/22/20   Clapacs, Madie Reno, MD  QUEtiapine (SEROQUEL) 100 MG tablet Take 1 tablet (100 mg total) by mouth at bedtime. 10/22/20   Clapacs, Madie Reno, MD    Allergies    Patient has no known allergies.  Review of Systems   Review of Systems Ten systems reviewed and are negative for acute change, except as noted in the HPI.   Physical Exam Updated Vital Signs BP (!) 133/103 (BP Location: Right Arm)   Pulse 80   Temp 97.7 F (36.5 C) (Oral)   Resp 16   Ht 5\' 1"  (1.549 m)   Wt 70.3 kg   SpO2 100%   BMI 29.28 kg/m   Physical Exam Vitals and  nursing note reviewed.  Constitutional:      General: She is not in acute distress.    Appearance: She is well-developed. She is not diaphoretic.  HENT:     Head: Normocephalic and atraumatic.     Right Ear: External ear normal.     Left Ear: External ear normal.     Nose: Nose normal.     Mouth/Throat:     Mouth: Mucous membranes are moist.  Eyes:     General: No scleral icterus.    Extraocular Movements: Extraocular movements intact.     Conjunctiva/sclera: Conjunctivae normal.     Pupils: Pupils are equal, round, and reactive to light.     Comments: Mydriatic pupils  Cardiovascular:     Rate and Rhythm: Normal rate and regular rhythm.     Heart sounds: Normal heart sounds. No murmur heard.   No friction rub. No gallop.  Pulmonary:     Effort: Pulmonary effort is normal. No tachypnea or respiratory distress.     Breath sounds: Normal breath sounds.  Abdominal:     General: Bowel sounds are normal. There is no distension.     Palpations: Abdomen is soft. There is no mass.     Tenderness: There is no abdominal tenderness. There is no guarding.  Musculoskeletal:     Cervical back: Normal range of motion.     Comments: Geneneralized tenderness  Skin:    General: Skin is warm and dry.  Neurological:     Mental Status: She is alert and oriented to person, place, and time.     Motor: Abnormal muscle tone present.  Psychiatric:        Behavior: Behavior normal.    ED Results / Procedures / Treatments   Labs (all labs ordered are listed, but only abnormal results are displayed) Labs Reviewed  RESP PANEL BY RT-PCR (FLU A&B, COVID) ARPGX2  CBC  BASIC METABOLIC PANEL  URINALYSIS, ROUTINE W REFLEX MICROSCOPIC  CK    EKG None  Radiology No results found.  Procedures Procedures   Medications Ordered in ED Medications - No data to display  ED Course  I have reviewed the triage vital signs and the nursing notes.  Pertinent labs & imaging results that were available  during my care of the patient were reviewed by me and considered in my medical decision making (see chart for details).  Clinical Course as of 11/02/21 1945  Mon Nov 02, 2021  1459 Patient reports no changes in current meds except she began Wellbutrin at the beginning of the  month. [AH]    Clinical Course User Index [AH] Margarita Mail, PA-C   MDM Rules/Calculators/A&P                          With a past medical history of severe depression, incomplete C6 spinal cord injury with chronic dystonia and spasticity.  Patient presented to the emergency department with chief complaint of pain and weakness.  Differential diagnosis includes myalgias, myositis, worsening dystonia.  Patient does feel like her dystonia is worse than normal.  I asked the patient if she felt like this could be due to worsening depression.  Patient admits that she felt like this could be a contributing factor however she is never had pain with her depression does not feel like that is the cause.  She denies any recent injuries.  She is not having any medication withdrawal and has been on the same medications at the same doses taking her medications daily.  I discussed the case with Dr. Parke Simmers who agrees that patient needs MRI of the C-spine with and without contrast to work look for any worsening lesions at the C-spine.  Patient was given Valium and fentanyl here and was still unable to ambulate.  Normally she is able to stand, and ambulate and perform her regular ADLs.  Case discussed with Dr. Sherry Ruffing who is taken the patient and transfer to [facility].  I reviewed patient's labs which shows UA, CK level, sed rate, lactate, BMP and respiratory panel without significant abnormality.  CBC shows mild anemia of insignificant value, chest x-ray negative for signs of infection.  Patient stable for transfer at this time. Final Clinical Impression(s) / ED Diagnoses Final diagnoses:  None    Rx / DC Orders ED Discharge Orders     None         Margarita Mail, PA-C 11/02/21 2011    Sherwood Gambler, MD 11/04/21 1645

## 2021-11-02 NOTE — ED Triage Notes (Signed)
Patient BIB [facility] from Home for Flu-Like Symptoms.  Patient states she has been having Body Aches, Weakness, Intermittent Dry Cough for approximately 48 hours. No Fevers  History of Spinal Injury in 2022. Patient is normally able to live independently but due to recent symptoms, patient has had increased difficulty in performing normal ADLs.   NAD Noted during Triage. BIB Stretcher. A&Ox4. GCS 15.

## 2021-11-02 NOTE — ED Notes (Signed)
[facility] just left with pt, report called to chg RN at [facility]

## 2021-11-02 NOTE — ED Notes (Addendum)
Report given to Onalee Hua with [facility] Pt signed consent to transfer

## 2021-11-02 NOTE — ED Notes (Signed)
X-ray at bedside

## 2021-11-02 NOTE — ED Notes (Addendum)
Patient arrives from [facility], NAD.  144/100, 94, 16, 99% RA.  Pain 8/10.  2 person assist to move.

## 2021-11-03 ENCOUNTER — Observation Stay (HOSPITAL_COMMUNITY): Payer: Medicare Other

## 2021-11-03 ENCOUNTER — Encounter (HOSPITAL_COMMUNITY): Payer: Self-pay | Admitting: Neurological Surgery

## 2021-11-03 DIAGNOSIS — R29898 Other symptoms and signs involving the musculoskeletal system: Secondary | ICD-10-CM

## 2021-11-03 DIAGNOSIS — S14109S Unspecified injury at unspecified level of cervical spinal cord, sequela: Secondary | ICD-10-CM

## 2021-11-03 DIAGNOSIS — F332 Major depressive disorder, recurrent severe without psychotic features: Secondary | ICD-10-CM | POA: Diagnosis not present

## 2021-11-03 DIAGNOSIS — R531 Weakness: Secondary | ICD-10-CM | POA: Diagnosis not present

## 2021-11-03 LAB — CBC
HCT: 40.6 % (ref 36.0–46.0)
Hemoglobin: 12.8 g/dL (ref 12.0–15.0)
MCH: 26.6 pg (ref 26.0–34.0)
MCHC: 31.5 g/dL (ref 30.0–36.0)
MCV: 84.2 fL (ref 80.0–100.0)
Platelets: 240 10*3/uL (ref 150–400)
RBC: 4.82 MIL/uL (ref 3.87–5.11)
RDW: 15.5 % (ref 11.5–15.5)
WBC: 10 10*3/uL (ref 4.0–10.5)
nRBC: 0 % (ref 0.0–0.2)

## 2021-11-03 LAB — BASIC METABOLIC PANEL
Anion gap: 12 (ref 5–15)
BUN: 20 mg/dL (ref 6–20)
CO2: 19 mmol/L — ABNORMAL LOW (ref 22–32)
Calcium: 9 mg/dL (ref 8.9–10.3)
Chloride: 105 mmol/L (ref 98–111)
Creatinine, Ser: 0.79 mg/dL (ref 0.44–1.00)
GFR, Estimated: >60 mL/min (ref >60)
Glucose, Bld: 71 mg/dL (ref 70–99)
Potassium: 4.3 mmol/L (ref 3.5–5.1)
Sodium: 136 mmol/L (ref 135–145)

## 2021-11-03 LAB — MAGNESIUM: Magnesium: 2 mg/dL (ref 1.7–2.4)

## 2021-11-03 LAB — PHOSPHORUS: Phosphorus: 2.5 mg/dL (ref 2.5–4.6)

## 2021-11-03 LAB — HIV ANTIBODY (ROUTINE TESTING W REFLEX): HIV Screen 4th Generation wRfx: NONREACTIVE

## 2021-11-03 MED ORDER — ACETAMINOPHEN 325 MG PO TABS: 650.0000 mg | ORAL_TABLET | Freq: Four times a day (QID) | ORAL | Status: DC | PRN

## 2021-11-03 MED ORDER — POLYETHYLENE GLYCOL 3350 17 G PO PACK: 17.0000 g | PACK | Freq: Every day | ORAL | Status: DC | PRN

## 2021-11-03 MED ORDER — BACLOFEN 10 MG PO TABS
10.0000 mg | ORAL_TABLET | Freq: Three times a day (TID) | ORAL | Status: DC
  Administered 2021-11-03 – 2021-11-04 (×5): 10 mg via ORAL
  Filled 2021-11-03 (×7): qty 1

## 2021-11-03 MED ORDER — CLONAZEPAM 0.5 MG PO TABS: 0.5000 mg | ORAL_TABLET | Freq: Every day | ORAL | Status: DC | PRN

## 2021-11-03 MED ORDER — FENTANYL CITRATE PF 50 MCG/ML IJ SOSY
50.0000 ug | PREFILLED_SYRINGE | Freq: Once | INTRAMUSCULAR | Status: AC
Start: 2021-11-03 — End: 2021-11-03
  Administered 2021-11-03: 50 ug via INTRAVENOUS
  Filled 2021-11-03: qty 1

## 2021-11-03 MED ORDER — OXYCODONE HCL 5 MG PO TABS
5.0000 mg | ORAL_TABLET | ORAL | Status: DC | PRN
Start: 2021-11-03 — End: 2021-11-05
  Administered 2021-11-03 – 2021-11-04 (×4): 5 mg via ORAL
  Filled 2021-11-03 (×4): qty 1

## 2021-11-03 MED ORDER — BUSPIRONE HCL 5 MG PO TABS
10.0000 mg | ORAL_TABLET | Freq: Three times a day (TID) | ORAL | Status: DC
Start: 2021-11-03 — End: 2021-11-05
  Administered 2021-11-03 – 2021-11-04 (×4): 10 mg via ORAL
  Filled 2021-11-03 (×4): qty 2
  Filled 2021-11-03: qty 1

## 2021-11-03 MED ORDER — QUETIAPINE FUMARATE 100 MG PO TABS
100.0000 mg | ORAL_TABLET | Freq: Every day | ORAL | Status: DC
  Administered 2021-11-03: 100 mg via ORAL
  Filled 2021-11-03 (×2): qty 1

## 2021-11-03 MED ORDER — HYDROMORPHONE HCL 1 MG/ML IJ SOLN
0.5000 mg | INTRAMUSCULAR | Status: DC | PRN
  Administered 2021-11-03: 0.5 mg via INTRAVENOUS
  Filled 2021-11-03: qty 0.5

## 2021-11-03 MED ORDER — LORAZEPAM 2 MG/ML IJ SOLN: 0.5000 mg | Freq: Once | INTRAMUSCULAR | Status: DC

## 2021-11-03 MED ORDER — LORAZEPAM 2 MG/ML IJ SOLN
1.0000 mg | Freq: Once | INTRAMUSCULAR | Status: AC
  Administered 2021-11-03: 1 mg via INTRAVENOUS
  Filled 2021-11-03: qty 1

## 2021-11-03 MED ORDER — DIAZEPAM 5 MG PO TABS
5.0000 mg | ORAL_TABLET | Freq: Four times a day (QID) | ORAL | Status: DC | PRN
  Administered 2021-11-03: 5 mg via ORAL
  Filled 2021-11-03: qty 1

## 2021-11-03 MED ORDER — DULOXETINE HCL 60 MG PO CPEP
120.0000 mg | ORAL_CAPSULE | Freq: Every day | ORAL | Status: DC
  Administered 2021-11-03 – 2021-11-04 (×2): 120 mg via ORAL
  Filled 2021-11-03 (×2): qty 2

## 2021-11-03 MED ORDER — DEXTROSE-NACL 5-0.45 % IV SOLN: INTRAVENOUS | Status: AC

## 2021-11-03 MED ORDER — BISACODYL 5 MG PO TBEC: 5.0000 mg | DELAYED_RELEASE_TABLET | Freq: Every day | ORAL | Status: DC | PRN

## 2021-11-03 NOTE — Progress Notes (Signed)
Was Patient ID: Natasha [name], female   DOB: [birth year], 50 y.o.   MRN: [id number] Patient admitted early this morning for increased back pain and leg weakness.  Neurosurgery following.  MRI of lumbar and thoracic spine pending.  Patient seen and examined at bedside and plan of care discussed with her.  I have reviewed patient's medical records including this morning's H&P, current vitals, labs, medications myself.

## 2021-11-03 NOTE — Progress Notes (Signed)
Imaging reviewed.  Unremarkable MRI of thoracic and lumbar spine.  There is no indication for any surgical intervention.  We will sign off.  No need for neurosurgical follow-up.  Please call if we can be of further assistance

## 2021-11-03 NOTE — Plan of Care (Signed)

## 2021-11-03 NOTE — H&P (Signed)
History and Physical    [name] ZOX:[id number] DOB: May 10, 1971 DOA: 11/02/2021  PCP: Porfirio Oar, PA   Patient coming from: Home   Chief Complaint: Increased pain and weakness   HPI: Natasha [name] is a pleasant 50 y.o. 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  Patient reports insidiously worsening aches and pain from her neck, down through her low back and into bilateral legs over the past month or so, also worsening weakness in all extremities but particularly the bilateral legs.  She noticed having more difficulty with her PT for the past couple weeks due to this, and then for the past couple days she has had difficulty just getting up from her couch or transferring, and has been unable to ambulate with her assistive devices as she usually does.  She had 2 falls the night of 11/01/2021 related to her leg weakness but denies any significant injury from these.  She denies any fevers or chills.  No recent change in medications.  ED Course: Upon arrival to the ED, patient is found to be afebrile, saturating well on room air, and with blood pressure 116/87.  Chest x-ray negative for acute abnormality.  Chemistry panel and CBC are unremarkable.  Serum CK normal.  MRI cervical spine limited by artifact but not felt to reveal anything acutely worrisome on review by neurosurgery.  Patient was treated with Valium and 2 doses of fentanyl in the ED and hospitalist were asked to admit.  Review of Systems:  All other systems reviewed and apart from HPI, are negative.  Past Medical History:  Diagnosis Date   Anxiety    C6 spinal cord injury (HCC)    Depression     Past Surgical History:  Procedure Laterality Date   BACK SURGERY     NECK SURGERY      Social History:   reports that she has quit smoking.  She has never used smokeless tobacco. She reports current alcohol use. She reports that she does not use drugs.  No Known Allergies  Family History  Problem Relation Age of Onset   Alcohol abuse Father    Alcohol abuse Brother    Depression Mother    Alcohol abuse Mother    Parkinson's disease Mother      Prior to Admission medications   Medication Sig Start Date End Date Taking? Authorizing Provider  baclofen (LIORESAL) 10 MG tablet Take 1 tablet (10 mg total) by mouth 3 (three) times daily. 10/22/20   Clapacs, Jackquline Denmark, MD  busPIRone (BUSPAR) 10 MG tablet Take 1 tablet (10 mg total) by mouth 3 (three) times daily. 10/22/20   Clapacs, Jackquline Denmark, MD  clonazePAM (KLONOPIN) 0.5 MG tablet Take 1 tablet (0.5 mg total) by mouth daily as needed for anxiety. 10/22/20   Clapacs, Jackquline Denmark, MD  DULoxetine (CYMBALTA) 60 MG capsule Take 2 capsules (120 mg total) by mouth daily. 10/22/20   Clapacs, Jackquline Denmark, MD  hydrOXYzine (ATARAX/VISTARIL) 25 MG tablet Take 1 tablet (25 mg total) by mouth every 6 (six) hours as needed for anxiety. 10/22/20   Clapacs, Jackquline Denmark, MD  meloxicam (MOBIC) 15 MG tablet Take 1 tablet (15 mg total) by mouth daily. 10/22/20   Clapacs, Jackquline Denmark, MD  QUEtiapine (SEROQUEL) 100 MG tablet Take 1 tablet (100 mg total) by mouth at bedtime. 10/22/20   Clapacs,  Jackquline Denmark, MD    Physical Exam: Vitals:   11/03/21 0345 11/03/21 0400 11/03/21 0415 11/03/21 0430  BP: 120/74 131/75 126/76 121/77  Pulse: 75 86 77 77  Resp: 15 16 15 14   Temp:      TempSrc:      SpO2: 99% 100% 99% 97%  Weight:      Height:        Constitutional: NAD, calm  Eyes: PERTLA, lids and conjunctivae normal ENMT: Mucous membranes are moist. Posterior pharynx clear of any exudate or lesions.   Neck: supple, no masses  Respiratory:  no wheezing, no crackles. No accessory muscle use.  Cardiovascular: S1 & S2 heard, regular rate and rhythm. Trace RLE edema. Abdomen: No distension, no tenderness, soft. Bowel sounds active.   Musculoskeletal: no clubbing / cyanosis. Increased tone, contracted UEs b/l.   Skin: no significant rashes, lesions, ulcers. Warm, dry, well-perfused. Neurologic: CN 2-12 grossly intact. Increased tone and clonus, particularly involving RLE. Alert and oriented.  Psychiatric: Pleasant. Cooperative.    Labs and Imaging on Admission: I have personally reviewed following labs and imaging studies  CBC: Recent Labs  Lab 11/02/21 1341  WBC 8.9  HGB 11.9*  HCT 37.6  MCV 82.5  PLT 306   Basic Metabolic Panel: Recent Labs  Lab 11/02/21 1341  NA 138  K 4.0  CL 104  CO2 26  GLUCOSE 92  BUN 16  CREATININE 0.68  CALCIUM 9.5   GFR: Estimated Creatinine Clearance: 73.8 mL/min (by C-G formula based on SCr of 0.68 mg/dL). Liver Function Tests: No results for input(s): AST, ALT, ALKPHOS, BILITOT, PROT, ALBUMIN in the last 168 hours. No results for input(s): LIPASE, AMYLASE in the last 168 hours. No results for input(s): AMMONIA in the last 168 hours. Coagulation Profile: No results for input(s): INR, PROTIME in the last 168 hours. Cardiac Enzymes: Recent Labs  Lab 11/02/21 1341  CKTOTAL 85   BNP (last 3 results) No results for input(s): PROBNP in the last 8760 hours. HbA1C: No results for input(s): HGBA1C in the last 72 hours. CBG: No results for input(s): GLUCAP in the last 168 hours. Lipid Profile: No results for input(s): CHOL, HDL, LDLCALC, TRIG, CHOLHDL, LDLDIRECT in the last 72 hours. Thyroid Function Tests: No results for input(s): TSH, T4TOTAL, FREET4, T3FREE, THYROIDAB in the last 72 hours. Anemia Panel: No results for input(s): VITAMINB12, FOLATE, FERRITIN, TIBC, IRON, RETICCTPCT in the last 72 hours. Urine analysis:    Component Value Date/Time   COLORURINE YELLOW 11/02/2021 1718   APPEARANCEUR CLEAR 11/02/2021 1718   LABSPEC 1.021 11/02/2021 1718   PHURINE 5.5 11/02/2021 1718   GLUCOSEU NEGATIVE 11/02/2021 1718   HGBUR NEGATIVE 11/02/2021 1718    BILIRUBINUR NEGATIVE 11/02/2021 1718   KETONESUR 15 (A) 11/02/2021 1718   PROTEINUR NEGATIVE 11/02/2021 1718   UROBILINOGEN 1.0 06/19/2015 1532   NITRITE NEGATIVE 11/02/2021 1718   LEUKOCYTESUR NEGATIVE 11/02/2021 1718   Sepsis Labs: @LABRCNTIP (procalcitonin:4,lacticidven:4) ) Recent Results (from the past 240 hour(s))  Resp Panel by RT-PCR (Flu A&B, Covid) Nasopharyngeal Swab     Status: None   Collection Time: 11/02/21 11:42 AM   Specimen: Nasopharyngeal Swab; Nasopharyngeal(NP) swabs in vial transport medium  Result Value Ref Range Status   SARS Coronavirus 2 by RT PCR NEGATIVE NEGATIVE Final    Comment: (NOTE) SARS-CoV-2 target nucleic acids are NOT DETECTED.  The SARS-CoV-2 RNA is generally detectable in upper respiratory specimens during the acute phase of infection. The lowest concentration of SARS-CoV-2 viral copies  this assay can detect is 138 copies/mL. A negative result does not preclude SARS-Cov-2 infection and should not be used as the sole basis for treatment or other patient management decisions. A negative result may occur with  improper specimen collection/handling, submission of specimen other than nasopharyngeal swab, presence of viral mutation(s) within the areas targeted by this assay, and inadequate number of viral copies(<138 copies/mL). A negative result must be combined with clinical observations, patient history, and epidemiological information. The expected result is Negative.  Fact Sheet for Patients:  BloggerCourse.com  Fact Sheet for Healthcare Providers:  SeriousBroker.it  This test is no t yet approved or cleared by the [place] FDA and  has been authorized for detection and/or diagnosis of SARS-CoV-2 by FDA under an Emergency Use Authorization (EUA). This EUA will remain  in effect (meaning this test can be used) for the duration of the COVID-19 declaration under Section 564(b)(1) of the  Act, 21 U.S.C.section 360bbb-3(b)(1), unless the authorization is terminated  or revoked sooner.       Influenza A by PCR NEGATIVE NEGATIVE Final   Influenza B by PCR NEGATIVE NEGATIVE Final    Comment: (NOTE) The Xpert Xpress SARS-CoV-2/FLU/RSV plus assay is intended as an aid in the diagnosis of influenza from Nasopharyngeal swab specimens and should not be used as a sole basis for treatment. Nasal washings and aspirates are unacceptable for Xpert Xpress SARS-CoV-2/FLU/RSV testing.  Fact Sheet for Patients: BloggerCourse.com  Fact Sheet for Healthcare Providers: SeriousBroker.it  This test is not yet approved or cleared by the [place] FDA and has been authorized for detection and/or diagnosis of SARS-CoV-2 by FDA under an Emergency Use Authorization (EUA). This EUA will remain in effect (meaning this test can be used) for the duration of the COVID-19 declaration under Section 564(b)(1) of the Act, 21 U.S.C. section 360bbb-3(b)(1), unless the authorization is terminated or revoked.  Performed at [facility], [address] [place], [place] [address]      Radiological Exams on Admission: CT Cervical Spine Wo Contrast  Result Date: 11/03/2021 CLINICAL DATA:  Cervical radiculopathy, dislodged hardware EXAM: CT CERVICAL SPINE WITHOUT CONTRAST TECHNIQUE: Multidetector CT imaging of the cervical spine was performed without intravenous contrast. Multiplanar CT image reconstructions were also generated. COMPARISON:  No prior CT of the cervical spine, correlation is made with 11/02/2021 MRI cervical spine. FINDINGS: Alignment: Normal. Skull base and vertebrae: The skull base is unremarkable. Status post C5-C7 ACDF with C6 partial corpectomy and corpectomy spacer. Bilateral screws are noted at C5 and C7, but only a left screw is noted at C6, of indeterminate acuity. The corpectomy spacer appears to be fractured  on the right side superiorly (series 3, image 62 and series 8, image 60). The posteriorly projecting susceptibility artifact seen at C6 on the same-day MRI is felt to correlate with the fragment off the right superior aspect of the corpectomy spacer, although this does not significantly protrude into the spinal canal; there is incomplete coverage of the posterior aspect of the corpectomy spacer at the level of C6. Soft tissues and spinal canal: No prevertebral fluid or swelling. No visible canal hematoma. Disc levels:  Better evaluated on 11/02/2021 MRI Upper chest: No focal pulmonary opacity or pleural effusion. Other: Stent in the right vertebral artery at C5-C6. IMPRESSION: 1. Status post C5-C7 ACDF with C6 partial corpectomy. There is an apparent hardware fracture of the the superior right posterior aspect of the corpectomy spacer, which may correlate with the susceptibility artifact seen on  the 11/02/2021 MRI, which appear to be protruding into the spinal canal; on the CT, this fragment does not appear to protrude significantly into the spinal canal. 2. Bilateral ACDF screws are seen at C5 and C7, however at C6, only a left screw is noted, of indeterminate acuity. Correlate with surgical history. 3. For spinal canal and neural foraminal findings, please see 11/02/2021 MRI of the cervical spine. These results were called by telephone at the time of interpretation on 11/03/2021 at 2:51 am to provider Roxy Horseman , who verbally acknowledged these results. Electronically Signed   By: Wiliam Ke M.D.   On: 11/03/2021 02:53   MR CERVICAL SPINE W WO CONTRAST  Result Date: 11/02/2021 CLINICAL DATA:  Myelopathy, frequent falls EXAM: MRI CERVICAL SPINE WITHOUT AND WITH CONTRAST TECHNIQUE: Multiplanar and multiecho pulse sequences of the cervical spine, to include the craniocervical junction and cervicothoracic junction, were obtained without and with intravenous contrast. CONTRAST:  54mL GADAVIST GADOBUTROL 1  MMOL/ML IV SOLN COMPARISON:  No prior MRI. No prior CT. A prior radiograph is not available for comparison. FINDINGS: Alignment: No significant listhesis. Vertebrae: No acute fracture or suspicious osseous lesion. No abnormal enhancement. Status post ACDF C5-C7. Evaluation is somewhat limited by susceptibility, however there appear to be penetration of the posterior aspect of the C5 or C6 vertebral body by the ACDF screws, which at least abutting and possibly penetrate the spinal cord (series 8, image 23 and series 5, image 9). Cord: Near holocord increased T2 signal at the level of C5 (series 5, image 9 and series 8, image 18), just superior to the aforementioned ACDF screws. The spinal cord is otherwise normal in signal. No abnormal enhancement Posterior Fossa, vertebral arteries, paraspinal tissues: Negative. Disc levels: C2-C3: No significant disc bulge. Facet and uncovertebral hypertrophy. No spinal canal stenosis or neural foraminal narrowing. C3-C4: Minimal disc bulge with right foraminal protrusion. Uncovertebral and facet arthropathy. Mild spinal canal stenosis. Severe bilateral neural foraminal narrowing. C4-C5: No significant disc bulge. Left-greater-than-right facet and uncovertebral hypertrophy. No spinal canal stenosis. Mild bilateral neural foraminal narrowing. C5-C6: Status post fusion. ACDF screws violate the posterior cortex of the C5 or C6 vertebral body and abut and possibly penetrate the spinal cord at this level. No spinal canal stenosis or significant neural foraminal narrowing. C6-C7: Status post ACDF. No spinal canal stenosis or neural foraminal narrowing. C7-T1: No significant disc bulge. No spinal canal stenosis or neuroforaminal narrowing. IMPRESSION: 1. Status post ACDF C5-C7; evaluation is somewhat limited by susceptibility, however there appears to be violation of the posterior cortex of C5 or C6 by the ACDF screws, which then abut and possibly penetrate the spinal cord. Superior to  this level, there is an area of increased T2 signal throughout the majority of the spinal cord, likely myelomalacia, of indeterminate acuity. 2. C3-C4 mild spinal canal stenosis and severe bilateral neural foraminal narrowing. These results were called by telephone at the time of interpretation on 11/02/2021 at 11:08 pm to provider [facility] , who verbally acknowledged these results. Electronically Signed   By: Wiliam Ke M.D.   On: 11/02/2021 23:11   DG Chest Port 1 View  Result Date: 11/02/2021 CLINICAL DATA:  Flu-like symptoms EXAM: PORTABLE CHEST 1 VIEW COMPARISON:  05/14/2020 FINDINGS: The heart size and mediastinal contours are within normal limits. Both lungs are clear. The visualized skeletal structures are unremarkable. IMPRESSION: No acute abnormality of the lungs. Electronically Signed   By: Jearld Lesch M.D.   On: 11/02/2021 13:54  Assessment/Plan   1. Leg weakness; back pain; hx of c-spine injury  - Pt w/ spastic quadriparesis related to spinal cord injury in 2002 s/p ACDF p/w increasing pain, spasms, and weakness  - Neurosurgery consulting and much appreciated, suspects c-spine findings are chronic and recommends imaging thoracic and lumbar spine  - Continue supportive, check MRI lumbar and thoracic spine   2. Depression, anxiety  - Continue Cymbalta, Buspar, Seroquel, and prn Klonopin     DVT prophylaxis: SCDs  Code Status: Full  Level of Care: Level of care: Med-Surg Family Communication: none present  Disposition Plan:  Patient is from: Home  Anticipated d/c is to: TBD Anticipated d/c date is: Possibly as early as 11/04/21  Patient currently: Pending MRI thoracic and lumbar spine; may need therapy assessments   Consults called: Neurosurgery  Admission status: Observation    Briscoe Deutscher, MD [facility]  11/03/2021, 4:52 AM

## 2021-11-03 NOTE — Progress Notes (Signed)
CT reviewed and looks fine. No protrusion of hardware into canal. Fracture of interbody graft is old, fusion is solid. The single screw at C6 was planned I'm sure and holds the interbody graft to the plate during the acute recovery phase to prevent dislodgement of the graft. MRI T/L ordered.

## 2021-11-03 NOTE — ED Notes (Signed)
Attempted to call report to floor [phone]

## 2021-11-03 NOTE — ED Provider Notes (Signed)
Patient here with lower extremity weakness.  This has been worsening over the past 2 days.  Has history of remote C6 injury, was originally seen at droppage and sent over for MRI of the cervical spine to assess for migration of hardware.  Patient was seen by Dr. Yetta Barre, of neurosurgery, who states that the cervical spine looked okay, but recommends either CT or x-ray for further evaluation.  He expresses concern over T or L-spine motor neuron disease.    Per Dr. Manus Gunning, who spoke with Dr. Yetta Barre, neurosurgery is requesting hospitalist admission for further workup of the lower extremity weakness.  Recommends T and L spine MRIs.  Neurosurgery to follow along.  Appreciate Dr. Antionette Char for admitting.   Roxy Horseman, PA-C 11/03/21 0129    Glynn Octave, MD 11/03/21 [phone]

## 2021-11-03 NOTE — ED Notes (Addendum)
Phone report given to Lenord Carbo, RN of [phone]

## 2021-11-03 NOTE — Consult Note (Signed)
Subjective: Patient is a 50 y.o. female who complains of a more than 4-week history of low back pain.  She had plain films done through another hospital system about 4 weeks ago.  Over the last 48 hours the pain has become worse in the lower back and she feels like her legs are more spastic and weak and she is having more trouble getting up.  She has a history of a spinal cord injury in 2002 from a fall and underwent anterior cervical fusion at [facility]  She is a spastic quadriparetic but she usually is independent with transfers and ambulation with assistive devices.  Onset of symptoms was a few weeks ago, rapidly worsening since that time.  Onset was not related to a fall. The pain is rated severe, and is located at the across the lower back with some aching in the hips and legs. The pain is described as aching and occurs all day. The symptoms admits to been progressive. Symptoms are exacerbated by nothing in particular. The patient has tried antispasmodics.  She states she has not had back surgery though her chart says that she has.  She has a history of major depression.  Past Medical History:  Diagnosis Date   Anxiety    C6 spinal cord injury (HCC)    Depression     Past Surgical History:  Procedure Laterality Date   BACK SURGERY     NECK SURGERY      No Known Allergies  Social History   Tobacco Use   Smoking status: Former    Packs/day: 0.00    Types: Cigarettes   Smokeless tobacco: Never  Substance Use Topics   Alcohol use: Yes    Alcohol/week: 0.0 standard drinks    Comment: Occasional use    Family History  Problem Relation Age of Onset   Alcohol abuse Father    Alcohol abuse Brother    Depression Mother    Alcohol abuse Mother    Parkinson's disease Mother    Prior to Admission medications   Medication Sig Start Date End Date Taking? Authorizing Provider  baclofen (LIORESAL) 10 MG tablet Take 1 tablet (10 mg total) by mouth 3 (three) times daily. 10/22/20   Clapacs, Jackquline Denmark, MD  busPIRone (BUSPAR) 10 MG tablet Take 1 tablet (10 mg total) by mouth 3 (three) times daily. 10/22/20   Clapacs, Jackquline Denmark, MD  clonazePAM (KLONOPIN) 0.5 MG tablet Take 1 tablet (0.5 mg total) by mouth daily as needed for anxiety. 10/22/20   Clapacs, Jackquline Denmark, MD  DULoxetine (CYMBALTA) 60 MG capsule Take 2 capsules (120 mg total) by mouth daily. 10/22/20   Clapacs, Jackquline Denmark, MD  hydrOXYzine (ATARAX/VISTARIL) 25 MG tablet Take 1 tablet (25 mg total) by mouth every 6 (six) hours as needed for anxiety. 10/22/20   Clapacs, Jackquline Denmark, MD  meloxicam (MOBIC) 15 MG tablet Take 1 tablet (15 mg total) by mouth daily. 10/22/20   Clapacs, Jackquline Denmark, MD  QUEtiapine (SEROQUEL) 100 MG tablet Take 1 tablet (100 mg total) by mouth at bedtime. 10/22/20   Clapacs, Jackquline Denmark, MD     Review of Systems  Positive ROS: Negative  All other systems have been reviewed and were otherwise negative with the exception of those mentioned in the HPI and as above.  Objective: Vital signs in last 24 hours: Temp:  [97.7 F (36.5 C)-98 F (36.7 C)] 98 F (36.7 C) (11/21 2136) Pulse Rate:  [63-80] 80 (11/21 2136) Resp:  [12-30]  12 (11/21 2136) BP: (116-151)/(68-103) 119/68 (11/21 2136) SpO2:  [96 %-100 %] 98 % (11/21 2136) Weight:  [70.3 kg] 70.3 kg (11/21 1137)  General Appearance: Alert, cooperative, no distress, appears stated age Head: Normocephalic, without obvious abnormality, atraumatic Eyes: PERRL, conjunctiva/corneas clear, EOM's intact    Neck: Supple, symmetrical, trachea midline Back: Symmetric, no curvature, ROM normal, no CVA tenderness Lungs: respirations unlabored Heart: Regular rate and rhythm Abdomen: Soft Extremities: Extremities normal, atraumatic, no cyanosis or edema   NEUROLOGIC:   Mental status: alert and oriented, no aphasia, good attention span, Fund of knowledge/ memory ok Motor Exam -she has increased tone throughout but appears to have good strength to and in bed exam though her clonus and  tonicity makes it difficult to test individual muscle strength Sensory Exam - grossly normal Reflexes: Hyperreflexic Coordination -poor coordination from hypertonicity Gait -not tested Balance -not tested Cranial Nerves: I: smell Not tested  II: visual acuity  OS: na    OD: na  II: visual fields Full to confrontation  II: pupils Equal, round, reactive to light  III,VII: ptosis None  III,IV,VI: extraocular muscles  Full ROM  V: mastication Normal  V: facial light touch sensation  Normal  V,VII: corneal reflex  Present  VII: facial muscle function - upper  Normal  VII: facial muscle function - lower Normal  VIII: hearing Not tested  IX: soft palate elevation  Normal  IX,X: gag reflex Present  XI: trapezius strength  5/5  XI: sternocleidomastoid strength 5/5  XI: neck flexion strength  5/5  XII: tongue strength  Normal    Data Review Lab Results  Component Value Date   WBC 8.9 11/02/2021   HGB 11.9 (L) 11/02/2021   HCT 37.6 11/02/2021   MCV 82.5 11/02/2021   PLT 306 11/02/2021   Lab Results  Component Value Date   NA 138 11/02/2021   K 4.0 11/02/2021   CL 104 11/02/2021   CO2 26 11/02/2021   BUN 16 11/02/2021   CREATININE 0.68 11/02/2021   GLUCOSE 92 11/02/2021   No results found for: INR, PROTIME  I reviewed her MRI of her cervical spine independently as well as the report.  She had a previous ACDF with plating at C5-6 and C6-7 with what appears to be a solid arthrodesis.  There is mention of the screws going through the posterior aspect of the vertebral body into the canal.  Obviously, this would be an old finding since her surgery was in 2002 and the screws really cannot migrate to the bone.  We do not have films for comparison in our system.  There is myelomalacia in the cord behind C5 which is quite severe.  There is no adjacent level stenosis.  I do not see anything acutely worrisome.  Assessment/Plan: I think the findings in her cervical spine are chronic and  old.  I do not see anything new and worrisome.  Most of her pain is in her back and in her hips and legs and therefore she probably needs work-up of low back pain.  She does note increased tone which would make me worry about thoracic disease also.  Would recommend imaging of the thoracic and lumbar spine.  If no comparison films can be found, could consider CT scan of cervical spine to look at the bony anatomy and the screw placement to confirm what is probably a 50 year old complication of her original surgery.   Tia Alert 11/03/2021 12:39 AM

## 2021-11-04 DIAGNOSIS — Z87828 Personal history of other (healed) physical injury and trauma: Secondary | ICD-10-CM

## 2021-11-04 DIAGNOSIS — G8250 Quadriplegia, unspecified: Secondary | ICD-10-CM | POA: Diagnosis not present

## 2021-11-04 DIAGNOSIS — R531 Weakness: Secondary | ICD-10-CM | POA: Diagnosis not present

## 2021-11-04 DIAGNOSIS — F32A Depression, unspecified: Secondary | ICD-10-CM

## 2021-11-04 DIAGNOSIS — F419 Anxiety disorder, unspecified: Secondary | ICD-10-CM | POA: Diagnosis not present

## 2021-11-04 DIAGNOSIS — R29898 Other symptoms and signs involving the musculoskeletal system: Secondary | ICD-10-CM | POA: Diagnosis not present

## 2021-11-04 LAB — BASIC METABOLIC PANEL
Anion gap: 9 (ref 5–15)
BUN: 18 mg/dL (ref 6–20)
CO2: 24 mmol/L (ref 22–32)
Calcium: 8.7 mg/dL — ABNORMAL LOW (ref 8.9–10.3)
Chloride: 101 mmol/L (ref 98–111)
Creatinine, Ser: 0.7 mg/dL (ref 0.44–1.00)
GFR, Estimated: >60 mL/min (ref >60)
Glucose, Bld: 73 mg/dL (ref 70–99)
Potassium: 4 mmol/L (ref 3.5–5.1)
Sodium: 134 mmol/L — ABNORMAL LOW (ref 135–145)

## 2021-11-04 LAB — CBC
HCT: 39.2 % (ref 36.0–46.0)
Hemoglobin: 12.3 g/dL (ref 12.0–15.0)
MCH: 26.4 pg (ref 26.0–34.0)
MCHC: 31.4 g/dL (ref 30.0–36.0)
MCV: 84.1 fL (ref 80.0–100.0)
Platelets: 274 10*3/uL (ref 150–400)
RBC: 4.66 MIL/uL (ref 3.87–5.11)
RDW: 15.2 % (ref 11.5–15.5)
WBC: 9.8 10*3/uL (ref 4.0–10.5)
nRBC: 0 % (ref 0.0–0.2)

## 2021-11-04 LAB — MAGNESIUM: Magnesium: 2 mg/dL (ref 1.7–2.4)

## 2021-11-04 MED ORDER — DIAZEPAM 5 MG PO TABS: 5.0000 mg | ORAL_TABLET | Freq: Two times a day (BID) | ORAL | 0 refills | Status: AC

## 2021-11-04 NOTE — Evaluation (Signed)
Occupational Therapy Evaluation Patient Details Name: Natasha [name] MRN: [id number] DOB: 03/07/1971 Today's Date: 11/04/2021   History of Present Illness Pt is a 50 y/o female admitted 11/21 secondary to BLE weakenss. MRI showed disc protrusion at T8-9. PMH includes C6 SCI.   Clinical Impression   PTA patient independent with ADLs and mobility using wc or R forearm crutch. Pt admitted for above and limited by problem list below, including increased tone, impaired balance, decreased functional use of Ues, and dizziness.  She currently requires up to total assist for ADLs, min assist +2 for transfers.  She will benefit from continued OT services while admitted and after dc at CIR level to optimize independence and return back to PLOF.  Will follow acutely.      Recommendations for follow up therapy are one component of a multi-disciplinary discharge planning process, led by the attending physician.  Recommendations may be updated based on patient status, additional functional criteria and insurance authorization.   Follow Up Recommendations  Acute inpatient rehab (3hours/day)    Assistance Recommended at Discharge Frequent or constant Supervision/Assistance  Functional Status Assessment  Patient has had a recent decline in their functional status and demonstrates the ability to make significant improvements in function in a reasonable and predictable amount of time.  Equipment Recommendations  BSC/3in1    Recommendations for Other Services Rehab consult     Precautions / Restrictions Precautions Precautions: Fall Restrictions Weight Bearing Restrictions: No      Mobility Bed Mobility Overal bed mobility: Needs Assistance Bed Mobility: Rolling;Sidelying to Sit Rolling: Min assist Sidelying to sit: Mod assist       General bed mobility comments: Required assist for rolling and for trunk elevation to come to sitting.    Transfers Overall transfer level: Needs  assistance Equipment used: 2 person hand held assist Transfers: Sit to/from Stand;Bed to chair/wheelchair/BSC Sit to Stand: Min assist;+2 physical assistance Stand pivot transfers: Min assist;+2 physical assistance         General transfer comment: Required min A +2 for lift assist and steadying to stand and transfer to chair. Labored steps noted      Balance Overall balance assessment: Needs assistance Sitting-balance support: No upper extremity supported;Feet supported Sitting balance-Leahy Scale: Fair     Standing balance support: Bilateral upper extremity supported Standing balance-Leahy Scale: Poor Standing balance comment: Reliant on BUE and external support                           ADL either performed or assessed with clinical judgement   ADL                                               Vision         Perception     Praxis      Pertinent Vitals/Pain Pain Assessment: Faces Faces Pain Scale: Hurts little more Pain Location: headache Pain Descriptors / Indicators: Headache Pain Intervention(s): Monitored during session;Repositioned     Hand Dominance     Extremity/Trunk Assessment Upper Extremity Assessment Upper Extremity Assessment: RUE deficits/detail;LUE deficits/detail RUE Deficits / Details: increased tone, grossly 3/5 MMT proximally with hand in fist with limited AROM but able to passively stretch hand open RUE Coordination: decreased fine motor;decreased gross motor LUE Deficits / Details: increased tone, grossly 3/5 MMT proximally with  SCI deficits in hand at baseline LUE Coordination: decreased fine motor;decreased gross motor   Lower Extremity Assessment Lower Extremity Assessment: Defer to PT evaluation RLE Deficits / Details: Increased extensor tone noted in RLE. Pt reports normally wears AFO at baseline LLE Deficits / Details: Grossly 3/5 throughout   Cervical / Trunk Assessment Cervical / Trunk  Assessment: Kyphotic   Communication Communication Communication: No difficulties   Cognition Arousal/Alertness: Awake/alert Behavior During Therapy: WFL for tasks assessed/performed Overall Cognitive Status: Within Functional Limits for tasks assessed                                       General Comments  dizzy at EOB, VSS    Exercises     Shoulder Instructions      Home Living Family/patient expects to be discharged to:: Private residence Living Arrangements: Alone Available Help at Discharge: Friend(s) Type of Home: Apartment Home Access: Level entry     Home Layout: One level     Bathroom Shower/Tub: Chief Strategy Officer: Handicapped height     Home Equipment: Wheelchair - Careers adviser (comment);Tub bench (forearm crutch)   Additional Comments: HHPT services prior to admission      Prior Functioning/Environment Prior Level of Function : Independent/Modified Independent;History of Falls (last six months)             Mobility Comments: uses wc at home, R forearm crutch walking without assist. ADLs Comments: independent ADLs, IADLs        OT Problem List: Decreased strength;Decreased range of motion;Decreased activity tolerance;Impaired balance (sitting and/or standing);Decreased coordination;Decreased knowledge of use of DME or AE;Decreased knowledge of precautions;Impaired UE functional use;Pain      OT Treatment/Interventions: Self-care/ADL training;Therapeutic exercise;DME and/or AE instruction;Therapeutic activities;Patient/family education;Balance training;Neuromuscular education;Splinting    OT Goals(Current goals can be found in the care plan section) Acute Rehab OT Goals Patient Stated Goal: to get back home OT Goal Formulation: With patient Time For Goal Achievement: 11/18/21 Potential to Achieve Goals: Good  OT Frequency: Min 2X/week   Barriers to D/C:            Co-evaluation PT/OT/SLP  Co-Evaluation/Treatment: Yes Reason for Co-Treatment: For patient/therapist safety;To address functional/ADL transfers PT goals addressed during session: Mobility/safety with mobility;Balance OT goals addressed during session: ADL's and self-care      AM-PAC OT "6 Clicks" Daily Activity     Outcome Measure Help from another person eating meals?: A Little Help from another person taking care of personal grooming?: A Little Help from another person toileting, which includes using toliet, bedpan, or urinal?: Total Help from another person bathing (including washing, rinsing, drying)?: A Lot Help from another person to put on and taking off regular upper body clothing?: A Lot Help from another person to put on and taking off regular lower body clothing?: Total 6 Click Score: 12   End of Session Equipment Utilized During Treatment: Gait belt Nurse Communication: Mobility status  Activity Tolerance: Patient tolerated treatment well Patient left: in chair;with call bell/phone within reach;with chair alarm set  OT Visit Diagnosis: Other abnormalities of gait and mobility (R26.89);Muscle weakness (generalized) (M62.81);Other symptoms and signs involving the nervous system (R29.898);History of falling (Z91.81)                Time: 8127-5170 OT Time Calculation (min): 19 min Charges:  OT General Charges $OT Visit: 1 Visit OT Evaluation $OT Eval Moderate  Complexity: 1 Mod  Barry Brunner, OT Acute Rehabilitation Services Pager [phone] Office [phone]   Chancy Milroy 11/04/2021, 10:08 AM

## 2021-11-04 NOTE — Progress Notes (Signed)
Inpatient Rehab Admissions Coordinator:   Rehab MD reviewed case for medical necessity and felt that she did not meet criteria for CIR as she does not have necessity for an acute inpatient admission. I will not pursue for CIR admission. Recommend TOC consider other rehab venues.   Megan Salon, MS, CCC-SLP Rehab Admissions Coordinator  [phone] (celll) [phone] (office)

## 2021-11-04 NOTE — Care Management Obs Status (Signed)
MEDICARE OBSERVATION STATUS NOTIFICATION   Patient Details  Name: Aijah Lattner MRN: [id number] Date of Birth: [birth year]   Medicare Observation Status Notification Given:  Yes    Harriet Masson, RN 11/04/2021, 10:00 AM

## 2021-11-04 NOTE — TOC Initial Note (Signed)
Transition of Care [facility]) - Initial/Assessment Note    Patient Details  Name: Natasha [name] MRN: [id number] Date of Birth: 02-19-1971  Transition of Care [facility] CM/SW Contact:    Harriet Masson, RN Phone Number: 11/04/2021, 10:31 AM  Clinical Narrative:                 Spoke to patient regarding transition needs. Patient lives alone and has all the DME that she needs. She takes ubers to get to her appointments.  Expected Discharge Plan: Home w Home Health Services Barriers to Discharge: Continued Medical Work up   Patient Goals and CMS Choice Patient states their goals for this hospitalization and ongoing recovery are:: return home      Expected Discharge Plan and Services Expected Discharge Plan: Home w Home Health Services   Discharge Planning Services: CM Consult   Living arrangements for the past 2 months: Apartment                                      Prior Living Arrangements/Services Living arrangements for the past 2 months: Apartment Lives with:: Self              Current home services: DME, Home PT (has everything she needs/ [facility] HH-PT)    Activities of Daily Living Home Assistive Devices/Equipment: Crutches, Wheelchair ADL Screening (condition at time of admission) Patient's cognitive ability adequate to safely complete daily activities?: Yes Is the patient deaf or have difficulty hearing?: No Does the patient have difficulty seeing, even when wearing glasses/contacts?: No Does the patient have difficulty concentrating, remembering, or making decisions?: No Patient able to express need for assistance with ADLs?: No Does the patient have difficulty dressing or bathing?: Yes Independently performs ADLs?: No Communication: Independent Dressing (OT): Needs assistance Is this a change from baseline?: Pre-admission baseline Grooming: Needs assistance Is this a change from baseline?: Pre-admission baseline Feeding: Needs  assistance Is this a change from baseline?: Pre-admission baseline Bathing: Needs assistance Is this a change from baseline?: Pre-admission baseline Toileting: Needs assistance Is this a change from baseline?: Pre-admission baseline In/Out Bed: Needs assistance Is this a change from baseline?: Pre-admission baseline Walks in Home: Needs assistance Is this a change from baseline?: Change from baseline, expected to last <3 days Does the patient have difficulty walking or climbing stairs?: Yes Weakness of Legs: Right Weakness of Arms/Hands: Left  Permission Sought/Granted                  Emotional Assessment              Admission diagnosis:  Leg weakness, bilateral [R29.898] Patient Active Problem List   Diagnosis Date Noted   Leg weakness, bilateral 11/03/2021   Severe recurrent major depression without psychotic features (HCC) 10/16/2020   MDD (major depressive disorder), recurrent episode, severe (HCC) 10/15/2020   Right foot drop 10/13/2020   Muscle spasticity 01/29/2020   History of DVT (deep vein thrombosis) 05/11/2019   Spinal cord injury, cervical region (HCC) 05/11/2019   Paraplegia (HCC) 10/21/2017   PCP:  Porfirio Oar, PA Pharmacy:   [facility]ORE [phone] [name], [place] - [address] AT [facility] OF [address] & [facility] [address] [name] [address] Phone: [phone] Fax: [phone]     Social Determinants of Health (SDOH) Interventions    Readmission Risk Interventions No flowsheet data found.

## 2021-11-04 NOTE — Evaluation (Signed)
Physical Therapy Evaluation Patient Details Name: Natasha [name] MRN: [id number] DOB: Apr 09, 1971 Today's Date: 11/04/2021  History of Present Illness  Pt is a 50 y/o female admitted 11/21 secondary to BLE weakenss. MRI showed disc protrusion at T8-9. PMH includes C6 SCI.  Clinical Impression  Pt admitted secondary to problem above with deficits below. Pt requiring mod A for bed mobility and min A +2 to stand and transfer to chair. Pt reporting dizziness, but BP WFL. SCI at baseline and noted increased tone in BUE and RLE. Pt previously at a mod I level for transfers and gait. Recommending CIR level therapies to increase independence and safety. Will continue to follow acutely.        Recommendations for follow up therapy are one component of a multi-disciplinary discharge planning process, led by the attending physician.  Recommendations may be updated based on patient status, additional functional criteria and insurance authorization.  Follow Up Recommendations Acute inpatient rehab (3hours/day)    Assistance Recommended at Discharge Frequent or constant Supervision/Assistance  Functional Status Assessment Patient has had a recent decline in their functional status and demonstrates the ability to make significant improvements in function in a reasonable and predictable amount of time.  Equipment Recommendations  None recommended by PT    Recommendations for Other Services Rehab consult     Precautions / Restrictions Precautions Precautions: Fall Restrictions Weight Bearing Restrictions: No      Mobility  Bed Mobility Overal bed mobility: Needs Assistance Bed Mobility: Rolling;Sidelying to Sit Rolling: Min assist Sidelying to sit: Mod assist       General bed mobility comments: Required assist for rolling and for trunk elevation to come to sitting.    Transfers Overall transfer level: Needs assistance Equipment used: 2 person hand held assist Transfers: Sit  to/from Stand;Bed to chair/wheelchair/BSC Sit to Stand: Min assist;+2 physical assistance Stand pivot transfers: Min assist;+2 physical assistance         General transfer comment: Required min A +2 for lift assist and steadying to stand and transfer to chair. Labored steps noted    Ambulation/Gait                  Stairs            Wheelchair Mobility    Modified Rankin (Stroke Patients Only)       Balance Overall balance assessment: Needs assistance Sitting-balance support: No upper extremity supported;Feet supported Sitting balance-Leahy Scale: Fair     Standing balance support: Bilateral upper extremity supported Standing balance-Leahy Scale: Poor Standing balance comment: Reliant on BUE and external support                             Pertinent Vitals/Pain Pain Assessment: No/denies pain    Home Living Family/patient expects to be discharged to:: Private residence Living Arrangements: Alone Available Help at Discharge: Friend(s) Type of Home: Apartment Home Access: Level entry       Home Layout: One level Home Equipment: Wheelchair - Careers adviser (comment);Tub bench (forearm crutch) Additional Comments: HHPT services prior to admission    Prior Function Prior Level of Function : Independent/Modified Independent;History of Falls (last six months)             Mobility Comments: uses wc at home, R forearm crutch walking without assist. ADLs Comments: independent ADLs, IADLs     Hand Dominance        Extremity/Trunk Assessment   Upper Extremity Assessment  Upper Extremity Assessment: Defer to OT evaluation    Lower Extremity Assessment Lower Extremity Assessment: RLE deficits/detail;LLE deficits/detail RLE Deficits / Details: Increased extensor tone noted in RLE. Pt reports normally wears AFO at baseline LLE Deficits / Details: Grossly 3/5 throughout    Cervical / Trunk Assessment Cervical / Trunk Assessment:  Kyphotic  Communication   Communication: No difficulties  Cognition Arousal/Alertness: Awake/alert Behavior During Therapy: WFL for tasks assessed/performed Overall Cognitive Status: Within Functional Limits for tasks assessed                                          General Comments      Exercises     Assessment/Plan    PT Assessment Patient needs continued PT services  PT Problem List Decreased strength;Decreased activity tolerance;Decreased range of motion;Decreased balance;Decreased mobility;Impaired tone       PT Treatment Interventions DME instruction;Gait training;Therapeutic activities;Functional mobility training;Balance training;Therapeutic exercise;Patient/family education;Wheelchair mobility training    PT Goals (Current goals can be found in the Care Plan section)  Acute Rehab PT Goals Patient Stated Goal: to get stronger before going home PT Goal Formulation: With patient Time For Goal Achievement: 11/18/21 Potential to Achieve Goals: Good    Frequency Min 3X/week   Barriers to discharge        Co-evaluation PT/OT/SLP Co-Evaluation/Treatment: Yes Reason for Co-Treatment: To address functional/ADL transfers;For patient/therapist safety PT goals addressed during session: Mobility/safety with mobility;Balance         AM-PAC PT "6 Clicks" Mobility  Outcome Measure Help needed turning from your back to your side while in a flat bed without using bedrails?: A Little Help needed moving from lying on your back to sitting on the side of a flat bed without using bedrails?: A Lot Help needed moving to and from a bed to a chair (including a wheelchair)?: Total Help needed standing up from a chair using your arms (e.g., wheelchair or bedside chair)?: Total Help needed to walk in hospital room?: Total Help needed climbing 3-5 steps with a railing? : Total 6 Click Score: 9    End of Session Equipment Utilized During Treatment: Gait  belt Activity Tolerance: Patient tolerated treatment well Patient left: in chair;with call bell/phone within reach;with chair alarm set Nurse Communication: Mobility status PT Visit Diagnosis: Other abnormalities of gait and mobility (R26.89);Unsteadiness on feet (R26.81);Muscle weakness (generalized) (M62.81);Difficulty in walking, not elsewhere classified (R26.2)    Time: 0315-9458 PT Time Calculation (min) (ACUTE ONLY): 19 min   Charges:   PT Evaluation $PT Eval Moderate Complexity: 1 Mod          Farley Ly, PT, DPT  Acute Rehabilitation Services  Pager: [phone] Office: [phone]   Lehman Prom 11/04/2021, 9:11 AM

## 2021-11-04 NOTE — Progress Notes (Signed)
Inpatient Rehab Admissions Coordinator:   CIR consult received. I have requested rehab MD review for medical necessity and will update the chart when I receive a response.   Megan Salon, MS, CCC-SLP Rehab Admissions Coordinator  [phone] (celll) [phone] (office)

## 2021-11-04 NOTE — TOC Initial Note (Signed)
Transition of Care [facility]) - Initial/Assessment Note    Patient Details  Name: Natasha [name] MRN: [id number] Date of Birth: 11/14/1971  Transition of Care [facility]) CM/SW Contact:    Mearl Latin, LCSW Phone Number: 11/04/2021, 12:43 PM  Clinical Narrative:                 CSW received consult for possible SNF at time of discharge if CIR is unable to accept patient. CSW spoke with patient. Patient reported that she would like CIR. She does not want to go to SNF if that is not approved. She stated she would like to resume home health services with [facility]. CSW discussed equipment needs and patient stated she already had everything she needs. CSW confirmed PCP and address with patient. No further questions reported at this time.    Expected Discharge Plan: Home w Home Health Services Barriers to Discharge: Continued Medical Work up   Patient Goals and CMS Choice Patient states their goals for this hospitalization and ongoing recovery are:: return home CMS Medicare.gov Compare Post Acute Care list provided to:: Patient Choice offered to / list presented to : Patient  Expected Discharge Plan and Services Expected Discharge Plan: Home w Home Health Services   Discharge Planning Services: CM Consult Post Acute Care Choice: IP Rehab Living arrangements for the past 2 months: Apartment                                      Prior Living Arrangements/Services Living arrangements for the past 2 months: Apartment Lives with:: Self Patient language and need for interpreter reviewed:: Yes Do you feel safe going back to the place where you live?: Yes      Need for Family Participation in Patient Care: No (Comment) Care giver support system in place?: Yes (comment) Current home services: DME, Home PT (has everything she needs/ [facility] HH-PT) Criminal Activity/Legal Involvement Pertinent to Current Situation/Hospitalization: No - Comment as needed  Activities of Daily  Living Home Assistive Devices/Equipment: Crutches, Wheelchair ADL Screening (condition at time of admission) Patient's cognitive ability adequate to safely complete daily activities?: Yes Is the patient deaf or have difficulty hearing?: No Does the patient have difficulty seeing, even when wearing glasses/contacts?: No Does the patient have difficulty concentrating, remembering, or making decisions?: No Patient able to express need for assistance with ADLs?: No Does the patient have difficulty dressing or bathing?: Yes Independently performs ADLs?: No Communication: Independent Dressing (OT): Needs assistance Is this a change from baseline?: Pre-admission baseline Grooming: Needs assistance Is this a change from baseline?: Pre-admission baseline Feeding: Needs assistance Is this a change from baseline?: Pre-admission baseline Bathing: Needs assistance Is this a change from baseline?: Pre-admission baseline Toileting: Needs assistance Is this a change from baseline?: Pre-admission baseline In/Out Bed: Needs assistance Is this a change from baseline?: Pre-admission baseline Walks in Home: Needs assistance Is this a change from baseline?: Change from baseline, expected to last <3 days Does the patient have difficulty walking or climbing stairs?: Yes Weakness of Legs: Right Weakness of Arms/Hands: Left  Permission Sought/Granted Permission sought to share information with : Facility Industrial/product designer granted to share information with : Yes, Verbal Permission Granted     Permission granted to share info w AGENCY: CIR        Emotional Assessment Appearance:: Appears stated age Attitude/Demeanor/Rapport: Engaged Affect (typically observed): Accepting, Appropriate Orientation: : Oriented to  Self, Oriented to Place, Oriented to  Time, Oriented to Situation Alcohol / Substance Use: Not Applicable Psych Involvement: No (comment)  Admission diagnosis:  Leg weakness,  bilateral [R29.898] Patient Active Problem List   Diagnosis Date Noted   Leg weakness, bilateral 11/03/2021   Severe recurrent major depression without psychotic features (HCC) 10/16/2020   MDD (major depressive disorder), recurrent episode, severe (HCC) 10/15/2020   Right foot drop 10/13/2020   Muscle spasticity 01/29/2020   History of DVT (deep vein thrombosis) 05/11/2019   Spinal cord injury, cervical region (HCC) 05/11/2019   Paraplegia (HCC) 10/21/2017   PCP:  Porfirio Oar, PA Pharmacy:   [facility] DR[facility]ORE [phone] [name], [place] - [address] AT [facility] OF [address] & [facility] [address] [name] [address] Phone: [phone] Fax: [phone]     Social Determinants of Health (SDOH) Interventions    Readmission Risk Interventions No flowsheet data found.

## 2021-11-04 NOTE — Discharge Summary (Signed)
Physician Discharge Summary  Natasha [name] TSV:[id number] DOB: 1971-09-05 DOA: 11/02/2021  PCP: Porfirio Oar, PA  Admit date: 11/02/2021 Discharge date: 11/04/2021 Admitted From: Home. Disposition: Home Recommendations for Outpatient Follow-up:  Follow ups as below. Please obtain CBC/BMP/Mag at follow up Please follow up on the following pending results: None Home Health: PT/OT/RN Equipment/Devices: Patient has appropriate DME's Discharge Condition: Stable CODE STATUS: Full code  Follow-up Information     Porfirio Oar, PA. Schedule an appointment as soon as possible for a visit in 1 week(s).   Specialty: Family Medicine Contact information: [address] [place] [address] [phone]         [facility] Follow up.   Why: HHRN,HHPT               Hospital Course: 50 year old F with PMH of C6 spinal cord injury in 2002 s/p ACDF at [facility], residual spastic quadriparesis, ambulatory dysfunction, anxiety and depression presenting with progressive extremity pain, spasticity for about a month, and fall at home.  Work-up in ED including vitals, CMP, CBC, CK, MRI cervical spine without acute finding.  Neurosurgery consulted and recommended MRI thoracic and lumbar spine, which were also negative.  Patient was cleared from neurosurgery standpoint.  Symptoms improved.  Therapy recommended inpatient rehab but deemed to be not eligible by inpatient rehab team.  She declined SNF, and chose to go home with home health.  She is already on baclofen.  Gave prescription for Valium 5 mg twice daily to help with the spasticity.  She has been advised not to take Klonopin while she is on Valium.  Encouraged to follow-up with her PCP and seek for referral to pain specialist or spine specialist.  See individual problem list below for more on hospital course.  Discharge Diagnoses:  BLE weakness/back pain/spasticity inpatient with spastic quadriparesis  after spinal cord injury in 2002. -Patient had ACDF at [facility] in 2002. -Work-up including labs, MRI cervical/thoracic/lumbar spine without significant finding. -Neurosurgery signed off. -Not eligible for CIR. refused SNF.  Discharge home with home health. -Patient to continue baclofen.  Added diazepam 5 mg twice daily.  -Patient to hold Klonopin while on Valium. -Outpatient follow-up with PCP.  Will benefit from referral to pain clinic or spine specialist  Anxiety/depression -Continue home medications.  Body mass index is 29.28 kg/m.           Discharge Exam: Vitals:   11/04/21 0524 11/04/21 0735 11/04/21 1209 11/04/21 1628  BP: (!) 116/58 125/74 120/81 121/66  Pulse: 95 84 84 82  Temp: 97.9 F (36.6 C) 98.2 F (36.8 C) 98.1 F (36.7 C) 98 F (36.7 C)  Resp: 17 16 17 19   Height:      Weight:      SpO2: 95% 96% 97% 98%  TempSrc: Oral Oral Oral Oral  BMI (Calculated):         GENERAL: No apparent distress.  Nontoxic. HEENT: MMM.  Vision and hearing grossly intact.  NECK: Supple.  No apparent JVD.  RESP: 98% on RA.  No IWOB.  Fair aeration bilaterally. CVS:  RRR. Heart sounds normal.  ABD/GI/GU: Bowel sounds present. Soft. Non tender.  MSK/EXT:  Moves extremities.  Significant spasticity in RLE with knee flexion SKIN: no apparent skin lesion or wound NEURO: Awake and alert.  Oriented appropriately.  No apparent focal neuro deficit. PSYCH: Calm. Normal affect.   Discharge Instructions  Discharge Instructions     Call MD for:  extreme fatigue  Complete by: As directed    Call MD for:  severe uncontrolled pain   Complete by: As directed    Call MD for:  temperature >100.4   Complete by: As directed    Diet general   Complete by: As directed    Discharge instructions   Complete by: As directed    It has been a pleasure taking care of you!  You were hospitalized with increased pain, spasticity and fall.  The test is we have done including your blood work and  MRI did not reveal any significant finding to explain your symptoms.  Recommend follow-up with your primary care doctor, and neurology.  You may consider Valium but we do not suggest taking with Klonopin.  We have ordered therapy for therapeutic exercise as well.   Take care,   Increase activity slowly   Complete by: As directed       Allergies as of 11/04/2021   No Known Allergies      Medication List     TAKE these medications    baclofen 10 MG tablet Commonly known as: LIORESAL Take 1 tablet (10 mg total) by mouth 3 (three) times daily. Notes to patient: Take tonight @ bedtime 11/04/21   buPROPion 150 MG 24 hr tablet Commonly known as: WELLBUTRIN XL Take 150 mg by mouth in the morning and at bedtime.   busPIRone 10 MG tablet Commonly known as: BUSPAR Take 1 tablet (10 mg total) by mouth 3 (three) times daily. Notes to patient: Take tonight bedtime 11/04/21   clonazePAM 0.5 MG tablet Commonly known as: KLONOPIN Take 1 tablet (0.5 mg total) by mouth daily as needed for anxiety.   diazepam 5 MG tablet Commonly known as: Valium Take 1 tablet (5 mg total) by mouth every 12 (twelve) hours for 5 days. Do not take it with Klonopin Notes to patient: Tonight if you need it  ( do not take w/ Klonopin)   DULoxetine 60 MG capsule Commonly known as: CYMBALTA Take 2 capsules (120 mg total) by mouth daily. Notes to patient: Take tomorrow morning 11/05/21   hydrOXYzine 25 MG tablet Commonly known as: ATARAX/VISTARIL Take 1 tablet (25 mg total) by mouth every 6 (six) hours as needed for anxiety.   meloxicam 15 MG tablet Commonly known as: MOBIC Take 1 tablet (15 mg total) by mouth daily.   ondansetron 4 MG disintegrating tablet Commonly known as: ZOFRAN-ODT Take 4 mg by mouth every 8 (eight) hours as needed.   QUEtiapine 100 MG tablet Commonly known as: SEROQUEL Take 1 tablet (100 mg total) by mouth at bedtime. Notes to patient: Take tonight @ bedtime 11/04/21    SUMAtriptan 100 MG tablet Commonly known as: IMITREX Take 100 mg by mouth every 2 (two) hours as needed for migraine.        Consultations: Neurosurgery  Procedures/Studies:   CT Cervical Spine Wo Contrast  Result Date: 11/03/2021 CLINICAL DATA:  Cervical radiculopathy, dislodged hardware EXAM: CT CERVICAL SPINE WITHOUT CONTRAST TECHNIQUE: Multidetector CT imaging of the cervical spine was performed without intravenous contrast. Multiplanar CT image reconstructions were also generated. COMPARISON:  No prior CT of the cervical spine, correlation is made with 11/02/2021 MRI cervical spine. FINDINGS: Alignment: Normal. Skull base and vertebrae: The skull base is unremarkable. Status post C5-C7 ACDF with C6 partial corpectomy and corpectomy spacer. Bilateral screws are noted at C5 and C7, but only a left screw is noted at C6, of indeterminate acuity. The corpectomy spacer appears to be fractured  on the right side superiorly (series 3, image 62 and series 8, image 60). The posteriorly projecting susceptibility artifact seen at C6 on the same-day MRI is felt to correlate with the fragment off the right superior aspect of the corpectomy spacer, although this does not significantly protrude into the spinal canal; there is incomplete coverage of the posterior aspect of the corpectomy spacer at the level of C6. Soft tissues and spinal canal: No prevertebral fluid or swelling. No visible canal hematoma. Disc levels:  Better evaluated on 11/02/2021 MRI Upper chest: No focal pulmonary opacity or pleural effusion. Other: Stent in the right vertebral artery at C5-C6. IMPRESSION: 1. Status post C5-C7 ACDF with C6 partial corpectomy. There is an apparent hardware fracture of the the superior right posterior aspect of the corpectomy spacer, which may correlate with the susceptibility artifact seen on the 11/02/2021 MRI, which appear to be protruding into the spinal canal; on the CT, this fragment does not appear to  protrude significantly into the spinal canal. 2. Bilateral ACDF screws are seen at C5 and C7, however at C6, only a left screw is noted, of indeterminate acuity. Correlate with surgical history. 3. For spinal canal and neural foraminal findings, please see 11/02/2021 MRI of the cervical spine. These results were called by telephone at the time of interpretation on 11/03/2021 at 2:51 am to provider Roxy Horseman , who verbally acknowledged these results. Electronically Signed   By: Wiliam Ke M.D.   On: 11/03/2021 02:53   MR THORACIC SPINE WO CONTRAST  Result Date: 11/03/2021 CLINICAL DATA:  History of cervical spinal cord injury. Increasing pain, spasm and weakness. EXAM: MRI THORACIC AND LUMBAR SPINE WITHOUT CONTRAST TECHNIQUE: Multiplanar and multiecho pulse sequences of the thoracic and lumbar spine were obtained without intravenous contrast. COMPARISON:  None. FINDINGS: MRI THORACIC SPINE FINDINGS Alignment:  Normal Vertebrae: Normal marrow signal. No bone lesions or fractures. Small scattered hemangiomas are noted. Cord:  Normal cord signal intensity.  No cord lesions or syrinx. Paraspinal and other soft tissues: No significant paraspinal findings. No lung mass, pleural effusion or mediastinal process. The upper retroperitoneum is unremarkable. Disc levels: No significant disc protrusions, spinal or foraminal stenosis in the thoracic spine. There is a small focal central disc protrusion noted at T8-9 with minimal impression on the ventral thecal sac. MRI LUMBAR SPINE FINDINGS Segmentation: There are five lumbar type vertebral bodies. The last full intervertebral disc space is labeled L5-S1. Alignment:  Normal Vertebrae:  Normal marrow signal.  No bone lesions or fractures. Conus medullaris and cauda equina: Conus extends to the L1 level. Conus and cauda equina appear normal. Paraspinal and other soft tissues: No significant paraspinal or retroperitoneal findings. Disc levels: No lumbar disc  protrusions, spinal or foraminal stenosis. IMPRESSION: 1. Small focal central disc protrusion at T8-9 with minimal impression on the ventral thecal sac. 2. Otherwise normal thoracic spine MRI examination. 3. Unremarkable lumbar spine MRI examination. Electronically Signed   By: Rudie Meyer M.D.   On: 11/03/2021 11:27   MR LUMBAR SPINE WO CONTRAST  Result Date: 11/03/2021 CLINICAL DATA:  History of cervical spinal cord injury. Increasing pain, spasm and weakness. EXAM: MRI THORACIC AND LUMBAR SPINE WITHOUT CONTRAST TECHNIQUE: Multiplanar and multiecho pulse sequences of the thoracic and lumbar spine were obtained without intravenous contrast. COMPARISON:  None. FINDINGS: MRI THORACIC SPINE FINDINGS Alignment:  Normal Vertebrae: Normal marrow signal. No bone lesions or fractures. Small scattered hemangiomas are noted. Cord:  Normal cord signal intensity.  No cord lesions  or syrinx. Paraspinal and other soft tissues: No significant paraspinal findings. No lung mass, pleural effusion or mediastinal process. The upper retroperitoneum is unremarkable. Disc levels: No significant disc protrusions, spinal or foraminal stenosis in the thoracic spine. There is a small focal central disc protrusion noted at T8-9 with minimal impression on the ventral thecal sac. MRI LUMBAR SPINE FINDINGS Segmentation: There are five lumbar type vertebral bodies. The last full intervertebral disc space is labeled L5-S1. Alignment:  Normal Vertebrae:  Normal marrow signal.  No bone lesions or fractures. Conus medullaris and cauda equina: Conus extends to the L1 level. Conus and cauda equina appear normal. Paraspinal and other soft tissues: No significant paraspinal or retroperitoneal findings. Disc levels: No lumbar disc protrusions, spinal or foraminal stenosis. IMPRESSION: 1. Small focal central disc protrusion at T8-9 with minimal impression on the ventral thecal sac. 2. Otherwise normal thoracic spine MRI examination. 3. Unremarkable  lumbar spine MRI examination. Electronically Signed   By: Rudie Meyer M.D.   On: 11/03/2021 11:27   MR CERVICAL SPINE W WO CONTRAST  Result Date: 11/02/2021 CLINICAL DATA:  Myelopathy, frequent falls EXAM: MRI CERVICAL SPINE WITHOUT AND WITH CONTRAST TECHNIQUE: Multiplanar and multiecho pulse sequences of the cervical spine, to include the craniocervical junction and cervicothoracic junction, were obtained without and with intravenous contrast. CONTRAST:  7mL GADAVIST GADOBUTROL 1 MMOL/ML IV SOLN COMPARISON:  No prior MRI. No prior CT. A prior radiograph is not available for comparison. FINDINGS: Alignment: No significant listhesis. Vertebrae: No acute fracture or suspicious osseous lesion. No abnormal enhancement. Status post ACDF C5-C7. Evaluation is somewhat limited by susceptibility, however there appear to be penetration of the posterior aspect of the C5 or C6 vertebral body by the ACDF screws, which at least abutting and possibly penetrate the spinal cord (series 8, image 23 and series 5, image 9). Cord: Near holocord increased T2 signal at the level of C5 (series 5, image 9 and series 8, image 18), just superior to the aforementioned ACDF screws. The spinal cord is otherwise normal in signal. No abnormal enhancement Posterior Fossa, vertebral arteries, paraspinal tissues: Negative. Disc levels: C2-C3: No significant disc bulge. Facet and uncovertebral hypertrophy. No spinal canal stenosis or neural foraminal narrowing. C3-C4: Minimal disc bulge with right foraminal protrusion. Uncovertebral and facet arthropathy. Mild spinal canal stenosis. Severe bilateral neural foraminal narrowing. C4-C5: No significant disc bulge. Left-greater-than-right facet and uncovertebral hypertrophy. No spinal canal stenosis. Mild bilateral neural foraminal narrowing. C5-C6: Status post fusion. ACDF screws violate the posterior cortex of the C5 or C6 vertebral body and abut and possibly penetrate the spinal cord at this  level. No spinal canal stenosis or significant neural foraminal narrowing. C6-C7: Status post ACDF. No spinal canal stenosis or neural foraminal narrowing. C7-T1: No significant disc bulge. No spinal canal stenosis or neuroforaminal narrowing. IMPRESSION: 1. Status post ACDF C5-C7; evaluation is somewhat limited by susceptibility, however there appears to be violation of the posterior cortex of C5 or C6 by the ACDF screws, which then abut and possibly penetrate the spinal cord. Superior to this level, there is an area of increased T2 signal throughout the majority of the spinal cord, likely myelomalacia, of indeterminate acuity. 2. C3-C4 mild spinal canal stenosis and severe bilateral neural foraminal narrowing. These results were called by telephone at the time of interpretation on 11/02/2021 at 11:08 pm to provider [facility] , who verbally acknowledged these results. Electronically Signed   By: Wiliam Ke M.D.   On: 11/02/2021 23:11  DG Chest Port 1 View  Result Date: 11/02/2021 CLINICAL DATA:  Flu-like symptoms EXAM: PORTABLE CHEST 1 VIEW COMPARISON:  05/14/2020 FINDINGS: The heart size and mediastinal contours are within normal limits. Both lungs are clear. The visualized skeletal structures are unremarkable. IMPRESSION: No acute abnormality of the lungs. Electronically Signed   By: Jearld Lesch M.D.   On: 11/02/2021 13:54       The results of significant diagnostics from this hospitalization (including imaging, microbiology, ancillary and laboratory) are listed below for reference.     Microbiology: Recent Results (from the past 240 hour(s))  Resp Panel by RT-PCR (Flu A&B, Covid) Nasopharyngeal Swab     Status: None   Collection Time: 11/02/21 11:42 AM   Specimen: Nasopharyngeal Swab; Nasopharyngeal(NP) swabs in vial transport medium  Result Value Ref Range Status   SARS Coronavirus 2 by RT PCR NEGATIVE NEGATIVE Final    Comment: (NOTE) SARS-CoV-2 target nucleic acids are NOT  DETECTED.  The SARS-CoV-2 RNA is generally detectable in upper respiratory specimens during the acute phase of infection. The lowest concentration of SARS-CoV-2 viral copies this assay can detect is 138 copies/mL. A negative result does not preclude SARS-Cov-2 infection and should not be used as the sole basis for treatment or other patient management decisions. A negative result may occur with  improper specimen collection/handling, submission of specimen other than nasopharyngeal swab, presence of viral mutation(s) within the areas targeted by this assay, and inadequate number of viral copies(<138 copies/mL). A negative result must be combined with clinical observations, patient history, and epidemiological information. The expected result is Negative.  Fact Sheet for Patients:  BloggerCourse.com  Fact Sheet for Healthcare Providers:  SeriousBroker.it  This test is no t yet approved or cleared by the [place] FDA and  has been authorized for detection and/or diagnosis of SARS-CoV-2 by FDA under an Emergency Use Authorization (EUA). This EUA will remain  in effect (meaning this test can be used) for the duration of the COVID-19 declaration under Section 564(b)(1) of the Act, 21 U.S.C.section 360bbb-3(b)(1), unless the authorization is terminated  or revoked sooner.       Influenza A by PCR NEGATIVE NEGATIVE Final   Influenza B by PCR NEGATIVE NEGATIVE Final    Comment: (NOTE) The Xpert Xpress SARS-CoV-2/FLU/RSV plus assay is intended as an aid in the diagnosis of influenza from Nasopharyngeal swab specimens and should not be used as a sole basis for treatment. Nasal washings and aspirates are unacceptable for Xpert Xpress SARS-CoV-2/FLU/RSV testing.  Fact Sheet for Patients: BloggerCourse.com  Fact Sheet for Healthcare Providers: SeriousBroker.it  This test is not yet  approved or cleared by the [place] FDA and has been authorized for detection and/or diagnosis of SARS-CoV-2 by FDA under an Emergency Use Authorization (EUA). This EUA will remain in effect (meaning this test can be used) for the duration of the COVID-19 declaration under Section 564(b)(1) of the Act, 21 U.S.C. section 360bbb-3(b)(1), unless the authorization is terminated or revoked.  Performed at [facility], [address], [place], [place] [address]      Labs:  CBC: Recent Labs  Lab 11/02/21 1341 11/03/21 0757 11/04/21 0118  WBC 8.9 10.0 9.8  HGB 11.9* 12.8 12.3  HCT 37.6 40.6 39.2  MCV 82.5 84.2 84.1  PLT 306 240 274   BMP &GFR Recent Labs  Lab 11/02/21 1341 11/03/21 0757 11/04/21 0118  NA 138 136 134*  K 4.0 4.3 4.0  CL 104 105 101  CO2 26  19* 24  GLUCOSE 92 71 73  BUN CREATININE 0.68 0.79 0.70  CALCIUM 9.5 9.0 8.7*  MG  --  2.0 2.0  PHOS  --  2.5  --    Estimated Creatinine Clearance: 73.8 mL/min (by C-G formula based on SCr of 0.7 mg/dL). Liver & Pancreas: No results for input(s): AST, ALT, ALKPHOS, BILITOT, PROT, ALBUMIN in the last 168 hours. No results for input(s): LIPASE, AMYLASE in the last 168 hours. No results for input(s): AMMONIA in the last 168 hours. Diabetic: No results for input(s): HGBA1C in the last 72 hours. No results for input(s): GLUCAP in the last 168 hours. Cardiac Enzymes: Recent Labs  Lab 11/02/21 1341  CKTOTAL 85   No results for input(s): PROBNP in the last 8760 hours. Coagulation Profile: No results for input(s): INR, PROTIME in the last 168 hours. Thyroid Function Tests: No results for input(s): TSH, T4TOTAL, FREET4, T3FREE, THYROIDAB in the last 72 hours. Lipid Profile: No results for input(s): CHOL, HDL, LDLCALC, TRIG, CHOLHDL, LDLDIRECT in the last 72 hours. Anemia Panel: No results for input(s): VITAMINB12, FOLATE, FERRITIN, TIBC, IRON, RETICCTPCT in the last 72 hours. Urine  analysis:    Component Value Date/Time   COLORURINE YELLOW 11/02/2021 1718   APPEARANCEUR CLEAR 11/02/2021 1718   LABSPEC 1.021 11/02/2021 1718   PHURINE 5.5 11/02/2021 1718   GLUCOSEU NEGATIVE 11/02/2021 1718   HGBUR NEGATIVE 11/02/2021 1718   BILIRUBINUR NEGATIVE 11/02/2021 1718   KETONESUR 15 (A) 11/02/2021 1718   PROTEINUR NEGATIVE 11/02/2021 1718   UROBILINOGEN 1.0 06/19/2015 1532   NITRITE NEGATIVE 11/02/2021 1718   LEUKOCYTESUR NEGATIVE 11/02/2021 1718   Sepsis Labs: Invalid input(s): PROCALCITONIN, LACTICIDVEN   Time coordinating discharge: 35 minutes  SIGNED:  Almon Hercules, MD  [facility] 11/04/2021, 6:20 PM

## 2021-11-04 NOTE — TOC Transition Note (Signed)
Transition of Care [facility]) - CM/SW Discharge Note   Patient Details  Name: Qiana Landgrebe MRN: [id number] Date of Birth: 1971/03/18  [facility] CM/SW Contact:  Leone Haven, RN Phone Number: 11/04/2021, 4:40 PM   Clinical Narrative:    Patient is active with [facility] for HHPT, she would like to continue with them,  they will add HHRN, soc will begin 24 to 48 hrs post dc.     Final next level of care: Home w Home Health Services Barriers to Discharge: No Barriers Identified   Patient Goals and CMS Choice Patient states their goals for this hospitalization and ongoing recovery are:: rerurn home CMS Medicare.gov Compare Post Acute Care list provided to:: Patient Choice offered to / list presented to : Patient  Discharge Placement                       Discharge Plan and Services   Discharge Planning Services: CM Consult Post Acute Care Choice: IP Rehab            DME Agency: NA       HH Arranged: RN, PT HH Agency: [facility] Date [facility] Agency Contacted: 11/04/21 Time HH Agency Contacted: 1639 Representative spoke with at [facility] Agency: Amy  Social Determinants of Health (SDOH) Interventions     Readmission Risk Interventions No flowsheet data found.

## 2021-11-04 NOTE — Progress Notes (Signed)
Inpatient Rehab Admissions Coordinator Note:   Per therapy recommendations patient was screened for CIR candidacy by Stephania Fragmin, PT. At this time, pt appears to be a potential candidate for CIR. I will place an order for rehab consult for full assessment, per our protocol.  Please contact me any with questions.Estill Dooms, PT, DPT [phone] 11/04/21 11:22 AM

## 2022-01-25 ENCOUNTER — Encounter: Payer: Self-pay | Admitting: Emergency Medicine

## 2022-01-25 DIAGNOSIS — Z20822 Contact with and (suspected) exposure to covid-19: Secondary | ICD-10-CM | POA: Insufficient documentation

## 2022-01-25 DIAGNOSIS — Y900 Blood alcohol level of less than 20 mg/100 ml: Secondary | ICD-10-CM | POA: Insufficient documentation

## 2022-01-25 DIAGNOSIS — G8220 Paraplegia, unspecified: Secondary | ICD-10-CM | POA: Insufficient documentation

## 2022-01-25 DIAGNOSIS — Z79899 Other long term (current) drug therapy: Secondary | ICD-10-CM | POA: Insufficient documentation

## 2022-01-25 DIAGNOSIS — M21371 Foot drop, right foot: Secondary | ICD-10-CM | POA: Insufficient documentation

## 2022-01-25 DIAGNOSIS — M62838 Other muscle spasm: Secondary | ICD-10-CM | POA: Insufficient documentation

## 2022-01-25 DIAGNOSIS — F332 Major depressive disorder, recurrent severe without psychotic features: Secondary | ICD-10-CM | POA: Insufficient documentation

## 2022-01-25 LAB — COMPREHENSIVE METABOLIC PANEL
ALT: 26 U/L (ref 0–44)
AST: 28 U/L (ref 15–41)
Albumin: 4 g/dL (ref 3.5–5.0)
Alkaline Phosphatase: 76 U/L (ref 38–126)
Anion gap: 8 (ref 5–15)
BUN: 9 mg/dL (ref 6–20)
CO2: 25 mmol/L (ref 22–32)
Calcium: 9.7 mg/dL (ref 8.9–10.3)
Chloride: 103 mmol/L (ref 98–111)
Creatinine, Ser: 0.64 mg/dL (ref 0.44–1.00)
GFR, Estimated: >60 mL/min (ref >60)
Glucose, Bld: 89 mg/dL (ref 70–99)
Potassium: 4.3 mmol/L (ref 3.5–5.1)
Sodium: 136 mmol/L (ref 135–145)
Total Bilirubin: 0.5 mg/dL (ref 0.3–1.2)
Total Protein: 7.5 g/dL (ref 6.5–8.1)

## 2022-01-25 LAB — CBC
HCT: 40.8 % (ref 36.0–46.0)
Hemoglobin: 12.5 g/dL (ref 12.0–15.0)
MCH: 25.2 pg — ABNORMAL LOW (ref 26.0–34.0)
MCHC: 30.6 g/dL (ref 30.0–36.0)
MCV: 82.3 fL (ref 80.0–100.0)
Platelets: 362 10*3/uL (ref 150–400)
RBC: 4.96 MIL/uL (ref 3.87–5.11)
RDW: 16.6 % — ABNORMAL HIGH (ref 11.5–15.5)
WBC: 10.5 10*3/uL (ref 4.0–10.5)
nRBC: 0 % (ref 0.0–0.2)

## 2022-01-25 NOTE — ED Notes (Signed)
Pt was dressed out by this tech. Pt belonging includes: a fur jacket, a green and blue bag, black shirt, black pants,and brown slippers.  Pt has glasses with her and a book.

## 2022-01-25 NOTE — ED Triage Notes (Signed)
Pt to ED with friend who she called with concern of SI thoughts with plan. Per pt she was referred by [facility] to [facility] for Clapacs, MD, for ECT treatment. Pt called to report that she had not heard anything from the Friday 01/22/22 referral and was told that Clapacs, MD office had not received. Due to not being able to be seen or have referral accepted, pt called friend tonight to have her bring her in due to increase in mental health decline. Pt sts she is in a major depressive episode with suicidal ideations, decrease in oral intake/appetite as well as alcohol dependence. Last ETOH consumption today, 1 beer consumed at 1630. Per pt, she has not been drinking everyday. Pt reports 1-2 drinks every other day. Pt has plan, she bought cyanide off the Internet today and planned on consuming at end of month if she did not get into post-graduate program that she is to hear from at end of month. Over the past weekend, pt sts she had increase SI and planned to use her scalpel that she has at home to cut her wrist. Pt denies self harm. Pt is calm and cooperative in triage. Pt consents to safety and consent to not self harm while in ED. Pt lives at home alone but has close friends that she contacts if help is needed. Pt is wheelchair bound.

## 2022-01-26 ENCOUNTER — Other Ambulatory Visit: Payer: Self-pay | Admitting: Psychiatry

## 2022-01-26 ENCOUNTER — Inpatient Hospital Stay
Admission: AD | Admit: 2022-01-26 | Discharge: 2022-02-03 | DRG: 885 | Disposition: A | Discharge status: Home / Self Care | Payer: 59 | Source: Ambulatory Visit | Attending: Psychiatry | Admitting: Psychiatry

## 2022-01-26 ENCOUNTER — Encounter: Payer: Self-pay | Admitting: Psychiatry

## 2022-01-26 ENCOUNTER — Emergency Department (EMERGENCY_DEPARTMENT_HOSPITAL)
Admission: EM | Admit: 2022-01-26 | Discharge: 2022-01-26 | Disposition: A | Discharge status: Other Acute Inpatient Hospital | Payer: 59 | Source: Home / Self Care | Attending: Emergency Medicine | Admitting: Emergency Medicine

## 2022-01-26 ENCOUNTER — Other Ambulatory Visit: Payer: Self-pay

## 2022-01-26 DIAGNOSIS — Z9151 Personal history of suicidal behavior: Secondary | ICD-10-CM | POA: Diagnosis not present

## 2022-01-26 DIAGNOSIS — Z79899 Other long term (current) drug therapy: Secondary | ICD-10-CM

## 2022-01-26 DIAGNOSIS — F4310 Post-traumatic stress disorder, unspecified: Secondary | ICD-10-CM | POA: Diagnosis present

## 2022-01-26 DIAGNOSIS — R45851 Suicidal ideations: Secondary | ICD-10-CM | POA: Diagnosis present

## 2022-01-26 DIAGNOSIS — E119 Type 2 diabetes mellitus without complications: Secondary | ICD-10-CM | POA: Diagnosis present

## 2022-01-26 DIAGNOSIS — F429 Obsessive-compulsive disorder, unspecified: Secondary | ICD-10-CM | POA: Diagnosis present

## 2022-01-26 DIAGNOSIS — Z818 Family history of other mental and behavioral disorders: Secondary | ICD-10-CM

## 2022-01-26 DIAGNOSIS — Z86718 Personal history of other venous thrombosis and embolism: Secondary | ICD-10-CM

## 2022-01-26 DIAGNOSIS — M62838 Other muscle spasm: Secondary | ICD-10-CM | POA: Diagnosis present

## 2022-01-26 DIAGNOSIS — M21371 Foot drop, right foot: Secondary | ICD-10-CM | POA: Diagnosis present

## 2022-01-26 DIAGNOSIS — Z20822 Contact with and (suspected) exposure to covid-19: Secondary | ICD-10-CM | POA: Diagnosis present

## 2022-01-26 DIAGNOSIS — F332 Major depressive disorder, recurrent severe without psychotic features: Secondary | ICD-10-CM | POA: Diagnosis present

## 2022-01-26 DIAGNOSIS — G8220 Paraplegia, unspecified: Secondary | ICD-10-CM | POA: Diagnosis present

## 2022-01-26 DIAGNOSIS — Z87891 Personal history of nicotine dependence: Secondary | ICD-10-CM

## 2022-01-26 DIAGNOSIS — S14109A Unspecified injury at unspecified level of cervical spinal cord, initial encounter: Secondary | ICD-10-CM | POA: Diagnosis present

## 2022-01-26 DIAGNOSIS — R29898 Other symptoms and signs involving the musculoskeletal system: Secondary | ICD-10-CM | POA: Diagnosis present

## 2022-01-26 DIAGNOSIS — R252 Cramp and spasm: Secondary | ICD-10-CM | POA: Diagnosis not present

## 2022-01-26 HISTORY — DX: Obsessive-compulsive disorder, unspecified: F42.9

## 2022-01-26 HISTORY — DX: Post-traumatic stress disorder, unspecified: F43.10

## 2022-01-26 LAB — URINE DRUG SCREEN, QUALITATIVE (ARMC ONLY)
Amphetamines, Ur Screen: NOT DETECTED
Barbiturates, Ur Screen: NOT DETECTED
Benzodiazepine, Ur Scrn: POSITIVE — AB
Cannabinoid 50 Ng, Ur ~~LOC~~: NOT DETECTED
Cocaine Metabolite,Ur ~~LOC~~: NOT DETECTED
MDMA (Ecstasy)Ur Screen: NOT DETECTED
Methadone Scn, Ur: NOT DETECTED
Opiate, Ur Screen: NOT DETECTED
Phencyclidine (PCP) Ur S: NOT DETECTED
Tricyclic, Ur Screen: POSITIVE — AB

## 2022-01-26 LAB — RESP PANEL BY RT-PCR (FLU A&B, COVID) ARPGX2
Influenza A by PCR: NEGATIVE
Influenza B by PCR: NEGATIVE
SARS Coronavirus 2 by RT PCR: NEGATIVE

## 2022-01-26 LAB — POC URINE PREG, ED: Preg Test, Ur: NEGATIVE

## 2022-01-26 LAB — ACETAMINOPHEN LEVEL: Acetaminophen (Tylenol), Serum: <10 ug/mL — ABNORMAL LOW (ref 10–30)

## 2022-01-26 LAB — ETHANOL: Alcohol, Ethyl (B): 23 mg/dL — ABNORMAL HIGH (ref <10)

## 2022-01-26 LAB — SALICYLATE LEVEL: Salicylate Lvl: <7 mg/dL — ABNORMAL LOW (ref 7.0–30.0)

## 2022-01-26 MED ORDER — MELOXICAM 7.5 MG PO TABS
15.0000 mg | ORAL_TABLET | Freq: Every day | ORAL | Status: DC
  Administered 2022-01-26 – 2022-02-03 (×9): 15 mg via ORAL
  Filled 2022-01-26 (×9): qty 2

## 2022-01-26 MED ORDER — HYDROXYZINE HCL 25 MG PO TABS: 25.0000 mg | ORAL_TABLET | Freq: Four times a day (QID) | ORAL | Status: DC | PRN

## 2022-01-26 MED ORDER — ACETAMINOPHEN 325 MG PO TABS: 650.0000 mg | ORAL_TABLET | Freq: Four times a day (QID) | ORAL | Status: DC | PRN

## 2022-01-26 MED ORDER — CARIPRAZINE HCL 1.5 MG PO CAPS
1.5000 mg | ORAL_CAPSULE | Freq: Every day | ORAL | Status: DC
  Administered 2022-01-26: 1.5 mg via ORAL
  Filled 2022-01-26: qty 1

## 2022-01-26 MED ORDER — QUETIAPINE FUMARATE 25 MG PO TABS: 100.0000 mg | ORAL_TABLET | Freq: Every day | ORAL | Status: DC

## 2022-01-26 MED ORDER — BUSPIRONE HCL 5 MG PO TABS
10.0000 mg | ORAL_TABLET | Freq: Three times a day (TID) | ORAL | Status: DC
  Administered 2022-01-26: 10 mg via ORAL
  Filled 2022-01-26: qty 2

## 2022-01-26 MED ORDER — DULOXETINE HCL 30 MG PO CPEP
120.0000 mg | ORAL_CAPSULE | Freq: Every day | ORAL | Status: DC
  Administered 2022-01-26 – 2022-02-03 (×9): 120 mg via ORAL
  Filled 2022-01-26 (×9): qty 4

## 2022-01-26 MED ORDER — BACLOFEN 10 MG PO TABS
10.0000 mg | ORAL_TABLET | Freq: Three times a day (TID) | ORAL | Status: DC
  Administered 2022-01-26 – 2022-02-03 (×22): 10 mg via ORAL
  Filled 2022-01-26 (×26): qty 1

## 2022-01-26 MED ORDER — CLONAZEPAM 0.5 MG PO TABS: 0.5000 mg | ORAL_TABLET | Freq: Every day | ORAL | Status: DC | PRN

## 2022-01-26 MED ORDER — DIAZEPAM 5 MG PO TABS: 5.0000 mg | ORAL_TABLET | Freq: Two times a day (BID) | ORAL | Status: DC | PRN

## 2022-01-26 MED ORDER — SUMATRIPTAN SUCCINATE 50 MG PO TABS: 100.0000 mg | ORAL_TABLET | ORAL | Status: DC | PRN

## 2022-01-26 MED ORDER — CARIPRAZINE HCL 1.5 MG PO CAPS
1.5000 mg | ORAL_CAPSULE | Freq: Every day | ORAL | Status: DC
  Administered 2022-01-26 – 2022-01-27 (×2): 1.5 mg via ORAL
  Filled 2022-01-26 (×3): qty 1

## 2022-01-26 MED ORDER — DULOXETINE HCL 60 MG PO CPEP
120.0000 mg | ORAL_CAPSULE | Freq: Every day | ORAL | Status: DC
  Administered 2022-01-26: 120 mg via ORAL
  Filled 2022-01-26: qty 2

## 2022-01-26 MED ORDER — BUSPIRONE HCL 5 MG PO TABS
10.0000 mg | ORAL_TABLET | Freq: Three times a day (TID) | ORAL | Status: DC
  Administered 2022-01-26 – 2022-02-03 (×22): 10 mg via ORAL
  Filled 2022-01-26 (×22): qty 2

## 2022-01-26 MED ORDER — MAGNESIUM HYDROXIDE 400 MG/5ML PO SUSP: 30.0000 mL | Freq: Every day | ORAL | Status: DC | PRN

## 2022-01-26 MED ORDER — TRAZODONE HCL 100 MG PO TABS: 100.0000 mg | ORAL_TABLET | Freq: Every evening | ORAL | Status: DC | PRN

## 2022-01-26 MED ORDER — ONDANSETRON 4 MG PO TBDP: 4.0000 mg | ORAL_TABLET | Freq: Three times a day (TID) | ORAL | Status: DC | PRN

## 2022-01-26 MED ORDER — ALUM & MAG HYDROXIDE-SIMETH 200-200-20 MG/5ML PO SUSP
30.0000 mL | ORAL | Status: DC | PRN
  Administered 2022-01-30: 30 mL via ORAL
  Filled 2022-01-26: qty 30

## 2022-01-26 MED ORDER — HYDROXYZINE HCL 50 MG PO TABS: 50.0000 mg | ORAL_TABLET | Freq: Four times a day (QID) | ORAL | Status: DC | PRN

## 2022-01-26 MED ORDER — BACLOFEN 10 MG PO TABS
10.0000 mg | ORAL_TABLET | Freq: Three times a day (TID) | ORAL | Status: DC
  Administered 2022-01-26: 10 mg via ORAL
  Filled 2022-01-26 (×4): qty 1

## 2022-01-26 MED ORDER — BUPROPION HCL ER (XL) 150 MG PO TB24
150.0000 mg | ORAL_TABLET | Freq: Two times a day (BID) | ORAL | Status: DC
  Administered 2022-01-26 – 2022-01-27 (×3): 150 mg via ORAL
  Filled 2022-01-26 (×4): qty 1

## 2022-01-26 MED ORDER — SUMATRIPTAN SUCCINATE 50 MG PO TABS
100.0000 mg | ORAL_TABLET | ORAL | Status: DC | PRN
  Filled 2022-01-26: qty 2

## 2022-01-26 MED ORDER — QUETIAPINE FUMARATE 100 MG PO TABS
100.0000 mg | ORAL_TABLET | Freq: Every day | ORAL | Status: DC
  Administered 2022-01-26 – 2022-02-02 (×8): 100 mg via ORAL
  Filled 2022-01-26 (×8): qty 1

## 2022-01-26 MED ORDER — ALUM & MAG HYDROXIDE-SIMETH 200-200-20 MG/5ML PO SUSP: 30.0000 mL | ORAL | Status: DC | PRN

## 2022-01-26 MED ORDER — BUPROPION HCL ER (XL) 150 MG PO TB24
150.0000 mg | ORAL_TABLET | Freq: Every day | ORAL | Status: DC
  Administered 2022-01-26: 150 mg via ORAL
  Filled 2022-01-26: qty 1

## 2022-01-26 MED ORDER — ACETAMINOPHEN 325 MG PO TABS
650.0000 mg | ORAL_TABLET | Freq: Four times a day (QID) | ORAL | Status: DC | PRN
  Administered 2022-01-29 – 2022-02-03 (×5): 650 mg via ORAL
  Filled 2022-01-26 (×4): qty 2

## 2022-01-26 NOTE — ED Notes (Signed)
Given breakfast, declined.

## 2022-01-26 NOTE — ED Notes (Signed)
Lunch placed at bedside °

## 2022-01-26 NOTE — Tx Team (Signed)
Initial Treatment Plan 01/26/2022 3:39 PM [name] [id number]    PATIENT STRESSORS: Educational concerns   Financial difficulties   Health problems   Traumatic event     PATIENT STRENGTHS: Ability for insight  Capable of independent living  Communication skills  Motivation for treatment/growth    PATIENT IDENTIFIED PROBLEMS: Loneliness   Financial stress  Educational concern                 DISCHARGE CRITERIA:  Ability to meet basic life and health needs Adequate post-discharge living arrangements Improved stabilization in mood, thinking, and/or behavior Medical problems require only outpatient monitoring Verbal commitment to aftercare and medication compliance  PRELIMINARY DISCHARGE PLAN: Attend aftercare/continuing care group Return to previous living arrangement  PATIENT/FAMILY INVOLVEMENT: This treatment plan has been presented to and reviewed with the patient, Natasha [name], and/or family member, The patient and family have been given the opportunity to ask questions and make suggestions.  Merlene Morse, RN 01/26/2022, 3:39 PM

## 2022-01-26 NOTE — ED Provider Notes (Signed)
[facility] Provider Note    Event Date/Time   First MD Initiated Contact with Patient 01/26/22 0035     (approximate)   History   Mental Health Problem   HPI  Natasha [name] is a 51 y.o. female  with a history of anxiety, depression, OCD, PTSD who presents from home voluntarily for suicidal thoughts.  Patient has been trying to get in with Dr. Toni Amend for ECT treatment and for severe depression but has not been able to schedule an appointment yet.  She has been feeling extremely depressed with suicidal thoughts and a plan to use cyanide to kill herself.  Patient reports that she bought cyanide off the Internet today and is planning on taking it if she is not able to get into a postgraduate program that she is applying to.  Patient reports decreased oral intake.  She drinks alcohol about 1-2 drinks every other day.  She denies history of alcohol withdrawal or dependence.  She denies any other drug use.  Patient denies any medical complaints.     Past Medical History:  Diagnosis Date   Anxiety    C6 spinal cord injury (HCC)    Depression    OCD (obsessive compulsive disorder)    PTSD (post-traumatic stress disorder)     Past Surgical History:  Procedure Laterality Date   BACK SURGERY     NECK SURGERY       Physical Exam   Triage Vital Signs: ED Triage Vitals [01/25/22 2329]  Enc Vitals Group     BP 135/87     Pulse Rate 96     Resp 18     Temp 98.5 F (36.9 C)     Temp Source Oral     SpO2 97 %     Weight      Height      Head Circumference      Peak Flow      Pain Score      Pain Loc      Pain Edu?      Excl. in GC?     Most recent vital signs: Vitals:   01/25/22 2329  BP: 135/87  Pulse: 96  Resp: 18  Temp: 98.5 F (36.9 C)  SpO2: 97%    General: No apparent distress HEENT: moist mucous membranes CV: RRR Pulm: Normal WOB GI: soft and non tender MSK: no edema or cyanosis Neuro: face symmetric, moving all  extremities Psych: Calm and cooperative, normal speech and behavior, mood and affect normal   ED Results / Procedures / Treatments   Labs (all labs ordered are listed, but only abnormal results are displayed) Labs Reviewed  ETHANOL - Abnormal; Notable for the following components:      Result Value   Alcohol, Ethyl (B) 23 (*)    All other components within normal limits  SALICYLATE LEVEL - Abnormal; Notable for the following components:   Salicylate Lvl <7.0 (*)    All other components within normal limits  ACETAMINOPHEN LEVEL - Abnormal; Notable for the following components:   Acetaminophen (Tylenol), Serum <10 (*)    All other components within normal limits  CBC - Abnormal; Notable for the following components:   MCH 25.2 (*)    RDW 16.6 (*)    All other components within normal limits  URINE DRUG SCREEN, QUALITATIVE ([facility] ONLY) - Abnormal; Notable for the following components:   Tricyclic, Ur Screen POSITIVE (*)    Benzodiazepine, Ur Scrn POSITIVE (*)  All other components within normal limits  RESP PANEL BY RT-PCR (FLU A&B, COVID) ARPGX2  COMPREHENSIVE METABOLIC PANEL  POC URINE PREG, ED     EKG  none   RADIOLOGY none   PROCEDURES:  Critical Care performed: No  Procedures    IMPRESSION / MDM / ASSESSMENT AND PLAN / ED COURSE  I reviewed the triage vital signs and the nursing notes.  51 y.o. female  with a history of anxiety, depression, OCD, PTSD who presents from home voluntarily for suicidal thoughts with a plan to use cyanide that she bought off the Internet today.  Ddx: depression, anxiety, suicidal ideation   Plan: CBC, CMP, UDS, pregnancy test, alcohol, salicylate and acetaminophen levels.  Psychiatric consultation   MEDICATIONS GIVEN IN ED: Medications - No data to display   ED COURSE: Labs for medical clearance showing no signs of sepsis, anemia, electrolyte derangements, AKI.  UDS positive for benzos and tricyclic.  Patient is medically  cleared for psychiatric evaluation.  Psych has been consulted.  Given the patient is here voluntarily will take IVC papers in case she changes her mind and decides to leave especially knowing that she purchased cyanide off the Internet to use as a suicide attempt.  The patient has been placed in psychiatric observation due to the need to provide a safe environment for the patient while obtaining psychiatric consultation and evaluation, as well as ongoing medical and medication management to treat the patient's condition.  The patient has been placed under full IVC at this time.    Consults: Psychiatry   EMR reviewed including records from patient's last admission to the hospital in November 2022 for progressive extremity pain and spasticity    FINAL CLINICAL IMPRESSION(S) / ED DIAGNOSES   Final diagnoses:  Severe episode of recurrent major depressive disorder, without psychotic features (HCC)  Suicidal ideation     Rx / DC Orders   ED Discharge Orders     None        Note:  This document was prepared using Dragon voice recognition software and may include unintentional dictation errors.   Please note:  Patient was evaluated in Emergency Department today for the symptoms described in the history of present illness. Patient was evaluated in the context of the global COVID-19 pandemic, which necessitated consideration that the patient might be at risk for infection with the SARS-CoV-2 virus that causes COVID-19. Institutional protocols and algorithms that pertain to the evaluation of patients at risk for COVID-19 are in a state of rapid change based on information released by regulatory bodies including the CDC and federal and state organizations. These policies and algorithms were followed during the patient's care in the ED.  Some ED evaluations and interventions may be delayed as a result of limited staffing during the pandemic.       Don Perking, [place], MD 01/26/22 [phone]

## 2022-01-26 NOTE — Progress Notes (Signed)
51 years old female patient admitted with major depression for ECT. Patient wheelchair dependent  with independent with ADLs. Patient stated that ECT is the only thing works for her in these kind of situations.Patient states " I am surviving my own.No family support. I lost my mother last year. 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                   Patient denies SI,HI and AVH at this time. Skin assessment and body search done.No contraband found. Oriented to unit and made comfortable in her room. Support and encouragement given.

## 2022-01-26 NOTE — ED Notes (Signed)
Pt is currently sleeping, will collect vitals once awake.

## 2022-01-26 NOTE — [facility] Suicide Risk Assessment (Signed)
[facility] Admission Suicide Risk Assessment   Nursing information obtained from:  Patient Demographic factors:  Caucasian, Living alone Current Mental Status:  Suicide plan Loss Factors:  Loss of significant relationship, Financial problems / change in socioeconomic status Historical Factors:  Prior suicide attempts, Impulsivity Risk Reduction Factors:  NA  Total Time spent with patient: 1 hour Principal Problem: Severe recurrent major depression without psychotic features ([place]) Diagnosis:  Principal Problem:   Severe recurrent major depression without psychotic features ([place]) Active Problems:   Paraplegia ([place])   History of DVT (deep vein thrombosis)   Muscle spasticity   Right foot drop  Subjective Data: Patient seen and chart reviewed.  51 year old woman with a history of severe recurrent depression comes to the hospital with worsening depression for many weeks.  Not sleeping well.  Not eating.  Active suicidal thoughts with plans.  No psychosis.  Specifically suggesting ECT treatment.  Continued Clinical Symptoms:  Alcohol Use Disorder Identification Test Final Score (AUDIT): 1 The "Alcohol Use Disorders Identification Test", Guidelines for Use in Primary Care, Second Edition.  [facility]). Score between 0-7:  no or low risk or alcohol related problems. Score between 8-15:  moderate risk of alcohol related problems. Score between 16-19:  high risk of alcohol related problems. Score 20 or above:  warrants further diagnostic evaluation for alcohol dependence and treatment.   CLINICAL FACTORS:   Depression:   Comorbid alcohol abuse/dependence   Musculoskeletal: Strength & Muscle Tone: decreased Gait & Station: unable to stand Patient leans: N/A  Psychiatric Specialty Exam:  Presentation  General Appearance: Appropriate for Environment  Eye Contact:Good  Speech:Clear and Coherent  Speech Volume:Normal  Handedness:Right   Mood and Affect   Mood:Depressed  Affect:Blunt; Congruent   Thought Process  Thought Processes:Coherent  Descriptions of Associations:Intact  Orientation:Full (Time, Place and Person)  Thought Content:Logical  History of Schizophrenia/Schizoaffective disorder:No  Duration of Psychotic Symptoms:No data recorded Hallucinations:Hallucinations: None  Ideas of Reference:None  Suicidal Thoughts:Suicidal Thoughts: Yes, Active SI Active Intent and/or Plan: With Intent; With Plan; With Means to [place]; With Access to Means  Homicidal Thoughts:Homicidal Thoughts: No   Sensorium  Memory:Immediate Good; Recent Good; Remote Good  Judgment:Fair  Insight:Poor   Executive Functions  Concentration:Good  Attention Span:Good  [place]  Language:Good   Psychomotor Activity  Psychomotor Activity:Psychomotor Activity: Decreased   Assets  Assets:Communication Skills; Physical Health; Resilience; Social Support; Vocational/Educational   Sleep  Sleep:Sleep: Good Number of Hours of Sleep: 8    Physical Exam: Physical Exam Vitals and nursing note reviewed.  Constitutional:      Appearance: Normal appearance.  HENT:     Head: Normocephalic and atraumatic.     Mouth/Throat:     Pharynx: Oropharynx is clear.  Eyes:     Pupils: Pupils are equal, round, and reactive to light.  Cardiovascular:     Rate and Rhythm: Normal rate and regular rhythm.  Pulmonary:     Effort: Pulmonary effort is normal.     Breath sounds: Normal breath sounds.  Abdominal:     General: Abdomen is flat.     Palpations: Abdomen is soft.  Musculoskeletal:        General: Normal range of motion.  Skin:    General: Skin is warm and dry.  Neurological:     Mental Status: She is alert. Mental status is at baseline.     Comments: Quadriplegic with deficits throughout although has recovered significant activity in her upper extremities  Psychiatric:        Attention and Perception:  Attention normal.        Mood and Affect: Mood is depressed.        Speech: Speech normal.        Behavior: Behavior is cooperative.        Thought Content: Thought content includes suicidal ideation. Thought content includes suicidal plan.        Cognition and Memory: Cognition normal.        Judgment: Judgment is impulsive.   Review of Systems  Constitutional: Negative.   HENT: Negative.    Eyes: Negative.   Respiratory: Negative.    Cardiovascular: Negative.   Gastrointestinal: Negative.   Musculoskeletal: Negative.   Skin: Negative.   Neurological: Negative.   Psychiatric/Behavioral:  Positive for depression, substance abuse and suicidal ideas. Negative for hallucinations. The patient is nervous/anxious and has insomnia.   Blood pressure (!) 139/102, pulse 87, temperature 98.4 F (36.9 C), temperature source Oral, resp. rate 18, height 5\' 1"  (1.549 m), weight 75.8 kg, SpO2 (!) 82 %. Body mass index is 31.55 kg/m.   COGNITIVE FEATURES THAT CONTRIBUTE TO RISK:  None    SUICIDE RISK:   Mild:  Suicidal ideation of limited frequency, intensity, duration, and specificity.  There are no identifiable plans, no associated intent, mild dysphoria and related symptoms, good self-control (both objective and subjective assessment), few other risk factors, and identifiable protective factors, including available and accessible social support.  PLAN OF CARE: Continue 15-minute checks.  Appears to be at low risk of acting out in the hospital.  Begin ECT treatment tomorrow.  Ongoing assessment of dangerousness prior to discharge  I certify that inpatient services furnished can reasonably be expected to improve the patient's condition.   Alethia Berthold, MD 01/26/2022, 4:46 PM

## 2022-01-26 NOTE — Progress Notes (Signed)
Patient calm and pleasant during assessment. Pt denies SI/HI/AVH. Pt endorses depression and anxiety. Patient stated to this writer that she was ready for ECT tomorrow. Pt given education, support, and encouragement to be active in her treatment plan. Pt being monitored Q 15 minutes for safety per unit protocol. Pt remains safe on the unit.

## 2022-01-26 NOTE — Group Note (Signed)
[facility] LCSW Group Therapy Note   Group Date: 01/26/2022 Start Time: 1300 End Time: 1400  Type of Therapy/Topic:  Group Therapy:  Feelings about Diagnosis  Participation Level:  None   Mood: Blunted     Description of Group:    This group will allow patients to explore their thoughts and feelings about diagnoses they have received. Patients will be guided to explore their level of understanding and acceptance of these diagnoses. Facilitator will encourage patients to process their thoughts and feelings about the reactions of others to their diagnosis, and will guide patients in identifying ways to discuss their diagnosis with significant others in their lives. This group will be process-oriented, with patients participating in exploration of their own experiences as well as giving and receiving support and challenge from other group members.   Therapeutic Goals: 1. Patient will demonstrate understanding of diagnosis as evidence by identifying two or more symptoms of the disorder:  2. Patient will be able to express two feelings regarding the diagnosis 3. Patient will demonstrate ability to communicate their needs through discussion and/or role plays  Summary of Patient Progress: Patient was present for the entirety of group session. Patient participated in opening and closing remarks. However, patient did not contribute at all to the topic of discussion despite encouraged participation.     Therapeutic Modalities:   Cognitive Behavioral Therapy Brief Therapy Feelings Identification    Durenda Hurt, [place]

## 2022-01-26 NOTE — ED Notes (Signed)
TTS and NP in pts room for evaluation.
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
## 2022-01-26 NOTE — Consult Note (Signed)
[facility] Face-to-Face Psychiatry Consult   Reason for Consult: Mental Health Problem   Referring Physician: Dr. Don Perking Patient Identification: Natasha [name] MRN:  [id number] Principal Diagnosis: <principal problem not specified> Diagnosis:  Active Problems:   Paraplegia (HCC)   MDD (major depressive disorder), recurrent episode, severe (HCC)   History of DVT (deep vein thrombosis)   Muscle spasticity   Right foot drop   Spinal cord injury, cervical region [facility])   Severe recurrent major depression without psychotic features (HCC)   Leg weakness, bilateral   Total Time spent with patient: 1 hour  Subjective: "I am here to get my ECT done."  Natasha [name] is a 51 y.o. female patient presented to [facility] ED with a friend, and she is under involuntary commitment status (IVC). 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 The patient plan is at the end of February. At that time, she should hear from her school. The patient shared that she attempted suicide in 2002, which left her with a spinal cord injury. The patient did share that she last received her ECT about a year ago. "It worked well, and I was without any depressive symptoms for six months."  Per ED triage nurse report " Pt to ED with friend who she called with concern of SI thoughts with plan. Per pt she was referred by [facility] to [facility] for [place]acs, [place], for ECT treatment. Pt called to report that she had not heard anything from the Friday 01/22/22 referral and was told that Clapacs, MD office had not received. Due to not being able to be seen or have referral accepted, pt called friend tonight to have her bring her in due to increase in mental health decline."  The patient was seen face-to-face by this provider; the chart was reviewed and  consulted with Dr. Don Perking on 01/26/2022 due to the patient's care. It was discussed with the EDP that the patient does meet the criteria to be admitted to the psychiatric inpatient unit.  On evaluation, the patient is alert and oriented x4, calm, depressed, cooperative, and mood-congruent with affect. The patient does not appear to be responding to internal or external stimuli. Neither is the patient presenting with any delusional thinking. The patient denies auditory or visual hallucinations. The patient admits to suicidal ideation with a plan to consume cyanide but denies homicidal ideations. The patient is not presenting with any psychotic or paranoid behaviors.   HPI: Per Dr. Don Perking, Natasha [name] is a 51 y.o. female  with a history of anxiety, depression, OCD, PTSD who presents from home voluntarily for suicidal thoughts.  Patient has been trying to get in with Dr. Toni Amend for ECT treatment and for severe depression but has not been able to schedule an appointment yet.  She has been feeling extremely depressed with suicidal thoughts and a plan to use cyanide to kill herself.  Patient reports that she bought cyanide off the Internet today and is planning on taking it if she is not able to get into a postgraduate program that she is applying to.  Patient reports decreased oral intake.  She drinks alcohol about 1-2 drinks every other day.  She denies history of alcohol withdrawal or dependence.  She denies any other drug use.  Patient denies any medical complaints.  Past Psychiatric History:  OCD (obsessive compulsive disorder)   PTSD (post-traumatic stress disorder) Anxiety  Risk to Self:   Risk to Others:   Prior Inpatient Therapy:   Prior Outpatient Therapy:    Past Medical History:  Past Medical History:  Diagnosis Date   Anxiety    C6 spinal cord injury (HCC)    Depression    OCD (obsessive compulsive disorder)    PTSD (post-traumatic stress disorder)     Past  Surgical History:  Procedure Laterality Date   BACK SURGERY     NECK SURGERY     Family History:  Family History  Problem Relation Age of Onset   Alcohol abuse Father    Alcohol abuse Brother    Depression Mother    Alcohol abuse Mother    Parkinson's disease Mother    Family Psychiatric  History:  Social History:  Social History   Substance and Sexual Activity  Alcohol Use Yes   Alcohol/week: 0.0 standard drinks   Comment: Occasional use     Social History   Substance and Sexual Activity  Drug Use No    Social History   Socioeconomic History   Marital status: Single    Spouse name: Not on file   Number of children: Not on file   Years of education: Not on file   Highest education level: Not on file  Occupational History   Occupation: Disability  Tobacco Use   Smoking status: Former    Packs/day: 0.00    Types: Cigarettes   Smokeless tobacco: Never  Vaping Use   Vaping Use: Never used  Substance and Sexual Activity   Alcohol use: Yes    Alcohol/week: 0.0 standard drinks    Comment: Occasional use   Drug use: No   Sexual activity: Not Currently  Other Topics Concern   Not on file  Social History Narrative   Pt lives alone; receives outpatient psychiatry services through [facility].  Pt is on disability due to a spinal cord injury.    Social Determinants of Health   Financial Resource Strain: Not on file  Food Insecurity: Not on file  Transportation Needs: Not on file  Physical Activity: Not on file  Stress: Not on file  Social Connections: Not on file   Additional Social History:    Allergies:  No Known Allergies  Labs:  Results for orders placed or performed during the hospital encounter of 01/26/22 (from the past 48 hour(s))  Comprehensive metabolic panel     Status: None   Collection Time: 01/25/22 11:15 PM  Result Value Ref Range   Sodium 136 135 - 145 mmol/L   Potassium 4.3 3.5 - 5.1 mmol/L   Chloride 103 98 - 111 mmol/L   CO2  25 22 - 32 mmol/L   Glucose, Bld 89 70 - 99 mg/dL    Comment: Glucose reference range applies only to samples taken after fasting for at least 8 hours.   BUN 9 6 - 20 mg/dL   Creatinine, Ser 1.610.64 0.44 - 1.00 mg/dL   Calcium 9.7 8.9 - 09.610.3 mg/dL   Total Protein 7.5 6.5 - 8.1 g/dL   Albumin 4.0 3.5 - 5.0 g/dL   AST 28 15 - 41 U/L   ALT 26 0 - 44 U/L   Alkaline Phosphatase 76 38 - 126 U/L   Total Bilirubin 0.5 0.3 - 1.2 mg/dL   GFR, Estimated >04>60 >54>60 mL/min    Comment: (NOTE) Calculated using the CKD-EPI Creatinine Equation (  2021)    Anion gap 8 5 - 15    Comment: Performed at [facility], [address] [place], [place] [address]  Ethanol     Status: Abnormal   Collection Time: 01/25/22 11:15 PM  Result Value Ref Range   Alcohol, Ethyl (B) 23 (H) <10 mg/dL    Comment: (NOTE) Lowest detectable limit for serum alcohol is 10 mg/dL.  For medical purposes only. Performed at [facility], [address] [place], [place] [address]   Salicylate level     Status: Abnormal   Collection Time: 01/25/22 11:15 PM  Result Value Ref Range   Salicylate Lvl <7.0 (L) 7.0 - 30.0 mg/dL    Comment: Performed at [facility], [address] [place], [place] [address]  Acetaminophen level     Status: Abnormal   Collection Time: 01/25/22 11:15 PM  Result Value Ref Range   Acetaminophen (Tylenol), Serum <10 (L) 10 - 30 ug/mL    Comment: (NOTE) Therapeutic concentrations vary significantly. A range of 10-30 ug/mL  may be an effective concentration for many patients. However, some  are best treated at concentrations outside of this range. Acetaminophen concentrations >150 ug/mL at 4 hours after ingestion  and >50 ug/mL at 12 hours after ingestion are often associated with  toxic reactions.  Performed at [facility], [address] [place], [place] [address]   cbc     Status: Abnormal   Collection Time: 01/25/22 11:15 PM  Result Value Ref Range   WBC  10.5 4.0 - 10.5 K/uL   RBC 4.96 3.87 - 5.11 MIL/uL   Hemoglobin 12.5 12.0 - 15.0 g/dL   HCT 15.0 56.9 - 79.4 %   MCV 82.3 80.0 - 100.0 fL   MCH 25.2 (L) 26.0 - 34.0 pg   MCHC 30.6 30.0 - 36.0 g/dL   RDW 80.1 (H) 65.5 - 37.4 %   Platelets 362 150 - 400 K/uL   nRBC 0.0 0.0 - 0.2 %    Comment: Performed at [facility], [address] [place], [place] [address]  POC urine preg, ED     Status: None   Collection Time: 01/26/22  1:24 AM  Result Value Ref Range   Preg Test, Ur NEGATIVE NEGATIVE    Comment:        THE SENSITIVITY OF THIS METHODOLOGY IS >24 mIU/mL   Urine Drug Screen, Qualitative     Status: Abnormal   Collection Time: 01/26/22  1:25 AM  Result Value Ref Range   Tricyclic, Ur Screen POSITIVE (A) NONE DETECTED   Amphetamines, Ur Screen NONE DETECTED NONE DETECTED   MDMA (Ecstasy)Ur Screen NONE DETECTED NONE DETECTED   Cocaine Metabolite,Ur [place] NONE DETECTED NONE DETECTED   Opiate, Ur Screen NONE DETECTED NONE DETECTED   Phencyclidine (PCP) Ur S NONE DETECTED NONE DETECTED   Cannabinoid 50 Ng, Ur [place] NONE DETECTED NONE DETECTED   Barbiturates, Ur Screen NONE DETECTED NONE DETECTED   Benzodiazepine, Ur Scrn POSITIVE (A) NONE DETECTED   Methadone Scn, Ur NONE DETECTED NONE DETECTED    Comment: (NOTE) Tricyclics + metabolites, urine    Cutoff 1000 ng/mL Amphetamines + metabolites, urine  Cutoff 1000 ng/mL MDMA (Ecstasy), urine              Cutoff 500 ng/mL Cocaine Metabolite, urine          Cutoff 300 ng/mL Opiate + metabolites, urine        Cutoff 300 ng/mL Phencyclidine (PCP),  urine         Cutoff 25 ng/mL Cannabinoid, urine                 Cutoff 50 ng/mL Barbiturates + metabolites, urine  Cutoff 200 ng/mL Benzodiazepine, urine              Cutoff 200 ng/mL Methadone, urine                   Cutoff 300 ng/mL  The urine drug screen provides only a preliminary, unconfirmed analytical test result and should not be used for non-medical purposes. Clinical  consideration and professional judgment should be applied to any positive drug screen result due to possible interfering substances. A more specific alternate chemical method must be used in order to obtain a confirmed analytical result. Gas chromatography / mass spectrometry (GC/MS) is the preferred confirm atory method. Performed at [facility], [address] [place], [place] [address]   Resp Panel by RT-PCR (Flu A&B, Covid) Nasopharyngeal Swab     Status: None   Collection Time: 01/26/22  4:08 AM   Specimen: Nasopharyngeal Swab; Nasopharyngeal(NP) swabs in vial transport medium  Result Value Ref Range   SARS Coronavirus 2 by RT PCR NEGATIVE NEGATIVE    Comment: (NOTE) SARS-CoV-2 target nucleic acids are NOT DETECTED.  The SARS-CoV-2 RNA is generally detectable in upper respiratory specimens during the acute phase of infection. The lowest concentration of SARS-CoV-2 viral copies this assay can detect is 138 copies/mL. A negative result does not preclude SARS-Cov-2 infection and should not be used as the sole basis for treatment or other patient management decisions. A negative result may occur with  improper specimen collection/handling, submission of specimen other than nasopharyngeal swab, presence of viral mutation(s) within the areas targeted by this assay, and inadequate number of viral copies(<138 copies/mL). A negative result must be combined with clinical observations, patient history, and epidemiological information. The expected result is Negative.  Fact Sheet for Patients:  BloggerCourse.comhttps://www.fda.gov/media/152166/download  Fact Sheet for Healthcare Providers:  SeriousBroker.ithttps://www.fda.gov/media/152162/download  This test is no t yet approved or cleared by the [place] FDA and  has been authorized for detection and/or diagnosis of SARS-CoV-2 by FDA under an Emergency Use Authorization (EUA). This EUA will remain  in effect (meaning this test can be used) for the  duration of the COVID-19 declaration under Section 564(b)(1) of the Act, 21 U.S.C.section 360bbb-3(b)(1), unless the authorization is terminated  or revoked sooner.       Influenza A by PCR NEGATIVE NEGATIVE   Influenza B by PCR NEGATIVE NEGATIVE    Comment: (NOTE) The Xpert Xpress SARS-CoV-2/FLU/RSV plus assay is intended as an aid in the diagnosis of influenza from Nasopharyngeal swab specimens and should not be used as a sole basis for treatment. Nasal washings and aspirates are unacceptable for Xpert Xpress SARS-CoV-2/FLU/RSV testing.  Fact Sheet for Patients: BloggerCourse.comhttps://www.fda.gov/media/152166/download  Fact Sheet for Healthcare Providers: SeriousBroker.ithttps://www.fda.gov/media/152162/download  This test is not yet approved or cleared by the [place] FDA and has been authorized for detection and/or diagnosis of SARS-CoV-2 by FDA under an Emergency Use Authorization (EUA). This EUA will remain in effect (meaning this test can be used) for the duration of the COVID-19 declaration under Section 564(b)(1) of the Act, 21 U.S.C. section 360bbb-3(b)(1), unless the authorization is terminated or revoked.  Performed at [facility], [address] [place], [place] [address]     No current facility-administered medications for this encounter.   Current Outpatient  Medications  Medication Sig Dispense Refill   baclofen (LIORESAL) 10 MG tablet Take 1 tablet (10 mg total) by mouth 3 (three) times daily. 90 each 0   buPROPion (WELLBUTRIN XL) 150 MG 24 hr tablet Take 150 mg by mouth in the morning and at bedtime.     busPIRone (BUSPAR) 10 MG tablet Take 1 tablet (10 mg total) by mouth 3 (three) times daily. 90 tablet 0   diazepam (VALIUM) 5 MG tablet Take 5 mg by mouth 2 (two) times daily as needed.     DULoxetine (CYMBALTA) 60 MG capsule Take 2 capsules (120 mg total) by mouth daily. 60 capsule 0   QUEtiapine (SEROQUEL) 100 MG tablet Take 1 tablet (100 mg total) by mouth at  bedtime. 30 tablet 0   VRAYLAR 1.5 MG capsule Take 1 capsule by mouth daily.     clonazePAM (KLONOPIN) 0.5 MG tablet Take 1 tablet (0.5 mg total) by mouth daily as needed for anxiety. 30 tablet 0   hydrOXYzine (ATARAX/VISTARIL) 25 MG tablet Take 1 tablet (25 mg total) by mouth every 6 (six) hours as needed for anxiety. (Patient not taking: Reported on 11/03/2021) 30 tablet 0   meloxicam (MOBIC) 15 MG tablet Take 1 tablet (15 mg total) by mouth daily. (Patient not taking: Reported on 11/03/2021) 30 tablet 0   ondansetron (ZOFRAN-ODT) 4 MG disintegrating tablet Take 4 mg by mouth every 8 (eight) hours as needed.     SUMAtriptan (IMITREX) 100 MG tablet Take 100 mg by mouth every 2 (two) hours as needed for migraine.      Musculoskeletal: Strength & Muscle Tone: flaccid, abnormal, and decreased Gait & Station: unsteady, ataxic Patient leans: Right, Left, and Backward  Psychiatric Specialty Exam:  Presentation  General Appearance: Appropriate for Environment  Eye Contact:Good  Speech:Clear and Coherent  Speech Volume:Normal  Handedness:Right   Mood and Affect  Mood:Depressed  Affect:Blunt; Congruent   Thought Process  Thought Processes:Coherent  Descriptions of Associations:Intact  Orientation:Full (Time, Place and Person)  Thought Content:Logical  History of Schizophrenia/Schizoaffective disorder:No data recorded Duration of Psychotic Symptoms:No data recorded Hallucinations:Hallucinations: None  Ideas of Reference:None  Suicidal Thoughts:Suicidal Thoughts: Yes, Active SI Active Intent and/or Plan: With Intent; With Plan; With Means to Carry Out; With Access to Means  Homicidal Thoughts:Homicidal Thoughts: No   Sensorium  Memory:Immediate Good; Recent Good; Remote Good  Judgment:Fair  Insight:Poor   Executive Functions  Concentration:Good  Attention Span:Good  Recall:Good  Fund of Knowledge:Good  Language:Good   Psychomotor Activity  Psychomotor  Activity:Psychomotor Activity: Decreased   Assets  Assets:Communication Skills; Physical Health; Resilience; Social Support; Vocational/Educational   Sleep  Sleep:Sleep: Good Number of Hours of Sleep: 8   Physical Exam: Physical Exam Vitals and nursing note reviewed.  Constitutional:      Appearance: Normal appearance.  HENT:     Head: Normocephalic and atraumatic.     Right Ear: External ear normal.     Left Ear: External ear normal.     Nose: Nose normal.     Mouth/Throat:     Mouth: Mucous membranes are dry.  Eyes:     Conjunctiva/sclera: Conjunctivae normal.  Cardiovascular:     Rate and Rhythm: Normal rate and regular rhythm.     Pulses: Normal pulses.  Pulmonary:     Effort: Pulmonary effort is normal.  Musculoskeletal:        General: Normal range of motion.     Cervical back: Normal range of motion and neck supple.  Neurological:  General: No focal deficit present.     Mental Status: She is alert and oriented to person, place, and time.  Psychiatric:        Attention and Perception: Attention and perception normal.        Mood and Affect: Mood is depressed. Affect is blunt and flat.        Speech: Speech normal.        Behavior: Behavior is cooperative.        Thought Content: Thought content includes suicidal ideation. Thought content includes suicidal plan.        Cognition and Memory: Cognition and memory normal.        Judgment: Judgment is impulsive and inappropriate.   Review of Systems  Psychiatric/Behavioral:  Positive for depression and suicidal ideas.   All other systems reviewed and are negative. Blood pressure 135/87, pulse 96, temperature 98.5 F (36.9 C), temperature source Oral, resp. rate 18, SpO2 97 %. There is no height or weight on file to calculate BMI.  Treatment Plan Summary: Medication management and Plan Patient does meet criteria for psychiatric inpatient admission  Disposition: Recommend psychiatric Inpatient admission when  medically cleared. Supportive therapy provided about ongoing stressors.  Gillermo Murdoch, NP 01/26/2022 5:14 AM

## 2022-01-26 NOTE — H&P (Signed)
Psychiatric Admission Assessment Adult  Patient Identification: Natasha [name] MRN:  [id number] Date of Evaluation:  01/26/2022 Chief Complaint:  Severe recurrent major depression without psychotic features ([place]) [F33.2] Principal Diagnosis: Severe recurrent major depression without psychotic features ([place]) Diagnosis:  Principal Problem:   Severe recurrent major depression without psychotic features ([place]) Active Problems:   Paraplegia ([place])   History of DVT (deep vein thrombosis)   Muscle spasticity   Right foot drop  History of Present Illness: Patient seen and chart reviewed.  Patient known from previous encounters.  51 year old woman with longstanding chronic recurrent severe depression.  Patient presented to the emergency room stating her mood has been much worse for several weeks.  Feels depressed all the time.  Very poor functioning.  Not eating well and not sleeping regularly.  Has been drinking several times a week more than usual.  Still compliant with outpatient medicines but has found herself having suicidal thoughts to the point of actually ordering some cyanide poison on line.  Patient denies hallucinations or psychosis.  She is specifically requesting ECT treatment which has been beneficial in the past Associated Signs/Symptoms: Depression Symptoms:  depressed mood, anhedonia, psychomotor retardation, feelings of worthlessness/guilt, difficulty concentrating, hopelessness, suicidal thoughts with specific plan, anxiety, disturbed sleep, Duration of Depression Symptoms: Less than two weeks  (Hypo) Manic Symptoms:  Impulsivity, Anxiety Symptoms:  Excessive Worry, Psychotic Symptoms:   None reported PTSD Symptoms: History of chronic anxiety and mood lability Total Time spent with patient: 1 hour  Past Psychiatric History: Patient has a long history of severe depression and has been on multiple medications.  ECT has been most effective for episodes of depression in  the past.  Patient has had several suicide attempts including the jump from a height that resulted in her quadriplegia.  Last ECT treatments with her at our facility about a year ago  Is the patient at risk to self? Yes.    Has the patient been a risk to self in the past 6 months? Yes.    Has the patient been a risk to self within the distant past? Yes.    Is the patient a risk to others? No.  Has the patient been a risk to others in the past 6 months? No.  Has the patient been a risk to others within the distant past? No.   Prior Inpatient Therapy:   Prior Outpatient Therapy:    Alcohol Screening: 1. How often do you have a drink containing alcohol?: Monthly or less 2. How many drinks containing alcohol do you have on a typical day when you are drinking?: 1 or 2 3. How often do you have six or more drinks on one occasion?: Never AUDIT-C Score: 1 4. How often during the last year have you found that you were not able to stop drinking once you had started?: Never 5. How often during the last year have you failed to do what was normally expected from you because of drinking?: Never 6. How often during the last year have you needed a first drink in the morning to get yourself going after a heavy drinking session?: Never 7. How often during the last year have you had a feeling of guilt of remorse after drinking?: Never 8. How often during the last year have you been unable to remember what happened the night before because you had been drinking?: Never 9. Have you or someone else been injured as a result of your drinking?: No 10. Has a relative  or friend or a doctor or another health worker been concerned about your drinking or suggested you cut down?: No Alcohol Use Disorder Identification Test Final Score (AUDIT): 1 Substance Abuse History in the last 12 months:  Yes.   Consequences of Substance Abuse: Alcohol abuse signals worsening depression unquestionably contributes to worsening  mood Previous Psychotropic Medications: Yes  Psychological Evaluations: Yes  Past Medical History:  Past Medical History:  Diagnosis Date   Anxiety    C6 spinal cord injury ([place])    Depression    OCD (obsessive compulsive disorder)    PTSD (post-traumatic stress disorder)     Past Surgical History:  Procedure Laterality Date   BACK SURGERY     NECK SURGERY     Family History:  Family History  Problem Relation Age of Onset   Alcohol abuse Father    Alcohol abuse Brother    Depression Mother    Alcohol abuse Mother    Parkinson's disease Mother    Family Psychiatric  History: Positive for alcohol abuse.  Parkinson's disease. Tobacco Screening:   Social History:  Social History   Substance and Sexual Activity  Alcohol Use Yes   Alcohol/week: 0.0 standard drinks   Comment: Occasional use     Social History   Substance and Sexual Activity  Drug Use No    Additional Social History:                           Allergies:  No Known Allergies Lab Results:  Results for orders placed or performed during the hospital encounter of 01/26/22 (from the past 48 hour(s))  Comprehensive metabolic panel     Status: None   Collection Time: 01/25/22 11:15 PM  Result Value Ref Range   Sodium 136 135 - 145 mmol/L   Potassium 4.3 3.5 - 5.1 mmol/L   Chloride 103 98 - 111 mmol/L   CO2 25 22 - 32 mmol/L   Glucose, Bld 89 70 - 99 mg/dL    Comment: Glucose reference range applies only to samples taken after fasting for at least 8 hours.   BUN 9 6 - 20 mg/dL   Creatinine, Ser 0.64 0.44 - 1.00 mg/dL   Calcium 9.7 8.9 - 10.3 mg/dL   Total Protein 7.5 6.5 - 8.1 g/dL   Albumin 4.0 3.5 - 5.0 g/dL   AST 28 15 - 41 U/L   ALT 26 0 - 44 U/L   Alkaline Phosphatase 76 38 - 126 U/L   Total Bilirubin 0.5 0.3 - 1.2 mg/dL   GFR, Estimated >60 >60 mL/min    Comment: (NOTE) Calculated using the CKD-EPI Creatinine Equation (2021)    Anion gap 8 5 - 15    Comment: Performed at [facility], [address] [place], [place] [address]  Ethanol     Status: Abnormal   Collection Time: 01/25/22 11:15 PM  Result Value Ref Range   Alcohol, Ethyl (B) 23 (H) <10 mg/dL    Comment: (NOTE) Lowest detectable limit for serum alcohol is 10 mg/dL.  For medical purposes only. Performed at [facility], [address] [place], [place] [id number]   Salicylate level     Status: Abnormal   Collection Time: 01/25/22 11:15 PM  Result Value Ref Range   Salicylate Lvl [id number] (L) 7.0 - 30.0 mg/dL    Comment: Performed at [facility], [address] [place],  [address]  Acetaminophen level  Status: Abnormal   Collection Time: 01/25/22 11:15 PM  Result Value Ref Range   Acetaminophen (Tylenol), Serum <10 (L) 10 - 30 ug/mL    Comment: (NOTE) Therapeutic concentrations vary significantly. A range of 10-30 ug/mL  may be an effective concentration for many patients. However, some  are best treated at concentrations outside of this range. Acetaminophen concentrations >150 ug/mL at 4 hours after ingestion  and >50 ug/mL at 12 hours after ingestion are often associated with  toxic reactions.  Performed at [facility], [address] [place], [place] [address]   cbc     Status: Abnormal   Collection Time: 01/25/22 11:15 PM  Result Value Ref Range   WBC 10.5 4.0 - 10.5 K/uL   RBC 4.96 3.87 - 5.11 MIL/uL   Hemoglobin 12.5 12.0 - 15.0 g/dL   HCT 40.8 36.0 - 46.0 %   MCV 82.3 80.0 - 100.0 fL   MCH 25.2 (L) 26.0 - 34.0 pg   MCHC 30.6 30.0 - 36.0 g/dL   RDW 16.6 (H) 11.5 - 15.5 %   Platelets 362 150 - 400 K/uL   nRBC 0.0 0.0 - 0.2 %    Comment: Performed at [facility], [address] [place], [place] [address]  POC urine preg, ED     Status: None   Collection Time: 01/26/22  1:24 AM  Result Value Ref Range   Preg Test, Ur NEGATIVE NEGATIVE    Comment:        THE SENSITIVITY OF THIS METHODOLOGY IS >24 mIU/mL   Urine Drug  Screen, Qualitative     Status: Abnormal   Collection Time: 01/26/22  1:25 AM  Result Value Ref Range   Tricyclic, Ur Screen POSITIVE (A) NONE DETECTED   Amphetamines, Ur Screen NONE DETECTED NONE DETECTED   MDMA (Ecstasy)Ur Screen NONE DETECTED NONE DETECTED   Cocaine Metabolite,Ur [place] NONE DETECTED NONE DETECTED   Opiate, Ur Screen NONE DETECTED NONE DETECTED   Phencyclidine (PCP) Ur S NONE DETECTED NONE DETECTED   Cannabinoid 50 Ng, Ur [place] NONE DETECTED NONE DETECTED   Barbiturates, Ur Screen NONE DETECTED NONE DETECTED   Benzodiazepine, Ur Scrn POSITIVE (A) NONE DETECTED   Methadone Scn, Ur NONE DETECTED NONE DETECTED    Comment: (NOTE) Tricyclics + metabolites, urine    Cutoff 1000 ng/mL Amphetamines + metabolites, urine  Cutoff 1000 ng/mL MDMA (Ecstasy), urine              Cutoff 500 ng/mL Cocaine Metabolite, urine          Cutoff 300 ng/mL Opiate + metabolites, urine        Cutoff 300 ng/mL Phencyclidine (PCP), urine         Cutoff 25 ng/mL Cannabinoid, urine                 Cutoff 50 ng/mL Barbiturates + metabolites, urine  Cutoff 200 ng/mL Benzodiazepine, urine              Cutoff 200 ng/mL Methadone, urine                   Cutoff 300 ng/mL  The urine drug screen provides only a preliminary, unconfirmed analytical test result and should not be used for non-medical purposes. Clinical consideration and professional judgment should be applied to any positive drug screen result due to possible interfering substances. A more specific alternate chemical method must be used in order to obtain a confirmed analytical result. Gas chromatography /  mass spectrometry (GC/MS) is the preferred confirm atory method. Performed at [facility], [address] [name], [place] [address]   Resp Panel by RT-PCR (Flu A&B, Covid) Nasopharyngeal Swab     Status: None   Collection Time: 01/26/22  4:08 AM   Specimen: Nasopharyngeal Swab; Nasopharyngeal(NP) swabs in vial transport  medium  Result Value Ref Range   SARS Coronavirus 2 by RT PCR NEGATIVE NEGATIVE    Comment: (NOTE) SARS-CoV-2 target nucleic acids are NOT DETECTED.  The SARS-CoV-2 RNA is generally detectable in upper respiratory specimens during the acute phase of infection. The lowest concentration of SARS-CoV-2 viral copies this assay can detect is 138 copies/mL. A negative result does not preclude SARS-Cov-2 infection and should not be used as the sole basis for treatment or other patient management decisions. A negative result may occur with  improper specimen collection/handling, submission of specimen other than nasopharyngeal swab, presence of viral mutation(s) within the areas targeted by this assay, and inadequate number of viral copies(<138 copies/mL). A negative result must be combined with clinical observations, patient history, and epidemiological information. The expected result is Negative.  Fact Sheet for Patients:  BloggerCourse.com  Fact Sheet for Healthcare Providers:  SeriousBroker.it  This test is no t yet approved or cleared by the [place] FDA and  has been authorized for detection and/or diagnosis of SARS-CoV-2 by FDA under an Emergency Use Authorization (EUA). This EUA will remain  in effect (meaning this test can be used) for the duration of the COVID-19 declaration under Section 564(b)(1) of the Act, 21 U.S.C.section 360bbb-3(b)(1), unless the authorization is terminated  or revoked sooner.       Influenza A by PCR NEGATIVE NEGATIVE   Influenza B by PCR NEGATIVE NEGATIVE    Comment: (NOTE) The Xpert Xpress SARS-CoV-2/FLU/RSV plus assay is intended as an aid in the diagnosis of influenza from Nasopharyngeal swab specimens and should not be used as a sole basis for treatment. Nasal washings and aspirates are unacceptable for Xpert Xpress SARS-CoV-2/FLU/RSV testing.  Fact Sheet for  Patients: BloggerCourse.com  Fact Sheet for Healthcare Providers: SeriousBroker.it  This test is not yet approved or cleared by the [place] FDA and has been authorized for detection and/or diagnosis of SARS-CoV-2 by FDA under an Emergency Use Authorization (EUA). This EUA will remain in effect (meaning this test can be used) for the duration of the COVID-19 declaration under Section 564(b)(1) of the Act, 21 U.S.C. section 360bbb-3(b)(1), unless the authorization is terminated or revoked.  Performed at [facility], [address] [place], [place] [address]     Blood Alcohol level:  Lab Results  Component Value Date   ETH 23 (H) 01/25/2022   ETH <10 10/16/2020    Metabolic Disorder Labs:  Lab Results  Component Value Date   HGBA1C 4.6 (L) 10/16/2020   MPG 85.32 10/16/2020   MPG 105.41 05/26/2020   Lab Results  Component Value Date   PROLACTIN 121.9 (H) 02/03/2017   Lab Results  Component Value Date   CHOL 236 (H) 10/16/2020   TRIG 94 10/16/2020   HDL 78 10/16/2020   CHOLHDL 3.0 10/16/2020   VLDL 19 10/16/2020   LDLCALC 139 (H) 10/16/2020   LDLCALC 160 (H) 05/26/2020    Current Medications: Current Facility-Administered Medications  Medication Dose Route Frequency Provider Last Rate Last Admin   acetaminophen (TYLENOL) tablet 650 mg  650 mg Oral Q6H PRN Severiano Utsey, Jackquline Denmark, MD       alum &  mag hydroxide-simeth (MAALOX/MYLANTA) 200-200-20 MG/5ML suspension 30 mL  30 mL Oral Q4H PRN Shruti Arrey, Madie Reno, MD       baclofen (LIORESAL) tablet 10 mg  10 mg Oral TID Fabrice Dyal, Madie Reno, MD       buPROPion (WELLBUTRIN XL) 24 hr tablet 150 mg  150 mg Oral BID Estrellita Lasky, Madie Reno, MD       busPIRone (BUSPAR) tablet 10 mg  10 mg Oral TID Annaliz Aven, Madie Reno, MD       cariprazine (VRAYLAR) capsule 1.5 mg  1.5 mg Oral Daily Ulonda Klosowski, Madie Reno, MD       clonazePAM (KLONOPIN) tablet 0.5 mg  0.5 mg Oral Daily PRN Blayton Huttner, Madie Reno, MD        DULoxetine (CYMBALTA) DR capsule 120 mg  120 mg Oral Daily Aylissa Heinemann, Madie Reno, MD       hydrOXYzine (ATARAX) tablet 25 mg  25 mg Oral Q6H PRN Particia Strahm, Madie Reno, MD       hydrOXYzine (ATARAX) tablet 50 mg  50 mg Oral Q6H PRN Natara Monfort T, MD       magnesium hydroxide (MILK OF MAGNESIA) suspension 30 mL  30 mL Oral Daily PRN Veronique Warga, Madie Reno, MD       meloxicam (MOBIC) tablet 15 mg  15 mg Oral Daily Julee Stoll T, MD       QUEtiapine (SEROQUEL) tablet 100 mg  100 mg Oral QHS Kanyon Seibold T, MD       SUMAtriptan (IMITREX) tablet 100 mg  100 mg Oral Q2H PRN Thyra Yinger, Madie Reno, MD       traZODone (DESYREL) tablet 100 mg  100 mg Oral QHS PRN Sommer Spickard, Madie Reno, MD       PTA Medications: Medications Prior to Admission  Medication Sig Dispense Refill Last Dose   baclofen (LIORESAL) 10 MG tablet Take 1 tablet (10 mg total) by mouth 3 (three) times daily. 90 each 0    buPROPion (WELLBUTRIN XL) 150 MG 24 hr tablet Take 150 mg by mouth in the morning and at bedtime.      busPIRone (BUSPAR) 10 MG tablet Take 1 tablet (10 mg total) by mouth 3 (three) times daily. 90 tablet 0    clonazePAM (KLONOPIN) 0.5 MG tablet Take 1 tablet (0.5 mg total) by mouth daily as needed for anxiety. 30 tablet 0    diazepam (VALIUM) 5 MG tablet Take 5 mg by mouth 2 (two) times daily as needed.      DULoxetine (CYMBALTA) 60 MG capsule Take 2 capsules (120 mg total) by mouth daily. 60 capsule 0    hydrOXYzine (ATARAX/VISTARIL) 25 MG tablet Take 1 tablet (25 mg total) by mouth every 6 (six) hours as needed for anxiety. (Patient not taking: Reported on 11/03/2021) 30 tablet 0    meloxicam (MOBIC) 15 MG tablet Take 1 tablet (15 mg total) by mouth daily. (Patient not taking: Reported on 11/03/2021) 30 tablet 0    ondansetron (ZOFRAN-ODT) 4 MG disintegrating tablet Take 4 mg by mouth every 8 (eight) hours as needed.      QUEtiapine (SEROQUEL) 100 MG tablet Take 1 tablet (100 mg total) by mouth at bedtime. 30 tablet 0    SUMAtriptan  (IMITREX) 100 MG tablet Take 100 mg by mouth every 2 (two) hours as needed for migraine.      VRAYLAR 1.5 MG capsule Take 1 capsule by mouth daily.       Musculoskeletal: Strength & Muscle Tone: spastic and decreased Gait &  Station: unable to stand Patient leans: N/A            Psychiatric Specialty Exam:  Presentation  General Appearance: Appropriate for Environment  Eye Contact:Good  Speech:Clear and Coherent  Speech Volume:Normal  Handedness:Right   Mood and Affect  Mood:Depressed  Affect:Blunt; Congruent   Thought Process  Thought Processes:Coherent  Duration of Psychotic Symptoms: No data recorded Past Diagnosis of Schizophrenia or Psychoactive disorder: No  Descriptions of Associations:Intact  Orientation:Full (Time, Place and Person)  Thought Content:Logical  Hallucinations:Hallucinations: None  Ideas of Reference:None  Suicidal Thoughts:Suicidal Thoughts: Yes, Active SI Active Intent and/or Plan: With Intent; With Plan; With Means to [place]; With Access to Means  Homicidal Thoughts:Homicidal Thoughts: No   Sensorium  Memory:Immediate Good; Recent Good; Remote Good  Judgment:Fair  Insight:Poor   Executive Functions  Concentration:Good  Attention Span:Good  [place]  Language:Good   Psychomotor Activity  Psychomotor Activity:Psychomotor Activity: Decreased   Assets  Assets:Communication Skills; Physical Health; Resilience; Social Support; Vocational/Educational   Sleep  Sleep:Sleep: Good Number of Hours of Sleep: 8    Physical Exam: Physical Exam Vitals and nursing note reviewed.  Constitutional:      Appearance: Normal appearance.  HENT:     Head: Normocephalic and atraumatic.     Mouth/Throat:     Pharynx: Oropharynx is clear.  Eyes:     Pupils: Pupils are equal, round, and reactive to light.  Cardiovascular:     Rate and Rhythm: Normal rate and regular rhythm.  Pulmonary:      Effort: Pulmonary effort is normal.     Breath sounds: Normal breath sounds.  Abdominal:     General: Abdomen is flat.     Palpations: Abdomen is soft.  Musculoskeletal:        General: Normal range of motion.  Skin:    General: Skin is warm and dry.  Neurological:     Mental Status: She is alert. Mental status is at baseline.     Comments: Patient is quadriplegic from an accident years ago.  She is at her baseline in terms of neurologic function  Psychiatric:        Attention and Perception: Attention normal.        Mood and Affect: Mood is depressed.        Behavior: Behavior is slowed.        Thought Content: Thought content includes suicidal ideation. Thought content includes suicidal plan.        Cognition and Memory: Cognition normal.   Review of Systems  Constitutional: Negative.   HENT: Negative.    Eyes: Negative.   Respiratory: Negative.    Cardiovascular: Negative.   Gastrointestinal: Negative.   Musculoskeletal: Negative.   Skin: Negative.   Neurological: Negative.   Psychiatric/Behavioral:  Positive for depression, substance abuse and suicidal ideas. Negative for hallucinations. The patient is nervous/anxious and has insomnia.   Blood pressure (!) 139/102, pulse 87, temperature 98.4 F (36.9 C), temperature source Oral, resp. rate 18, height 5\' 1"  (1.549 m), weight 75.8 kg, SpO2 (!) 82 %. Body mass index is 31.55 kg/m.  Treatment Plan Summary: Medication management and Plan continue current medications as prescribed.  Combination of several medicines including Wellbutrin and BuSpar.  Continue medicines for her spasticity including baclofen.  EKG will be ordered.  Labs so far unremarkable.  Plan is for ECT starting tomorrow.  N.p.o. order placed tonight.  Observation Level/Precautions:  15 minute checks  Laboratory:  Chemistry Profile  Psychotherapy:    Medications:    Consultations:    Discharge Concerns:    Estimated LOS:  Other:     Physician  Treatment Plan for Primary Diagnosis: Severe recurrent major depression without psychotic features ([place]) Long Term Goal(s): Improvement in symptoms so as ready for discharge  Short Term Goals: Ability to verbalize feelings will improve, Ability to disclose and discuss suicidal ideas, and Ability to demonstrate self-control will improve  Physician Treatment Plan for Secondary Diagnosis: Principal Problem:   Severe recurrent major depression without psychotic features ([place]) Active Problems:   Paraplegia ([place])   History of DVT (deep vein thrombosis)   Muscle spasticity   Right foot drop  Long Term Goal(s): Improvement in symptoms so as ready for discharge  Short Term Goals: Compliance with prescribed medications will improve  I certify that inpatient services furnished can reasonably be expected to improve the patient's condition.    Alethia Berthold, MD 2/14/20234:49 PM

## 2022-01-27 ENCOUNTER — Inpatient Hospital Stay: Payer: 59 | Admitting: Certified Registered"

## 2022-01-27 ENCOUNTER — Encounter: Payer: Self-pay | Admitting: Psychiatry

## 2022-01-27 ENCOUNTER — Ambulatory Visit: Payer: 59 | Attending: Psychiatry

## 2022-01-27 DIAGNOSIS — E119 Type 2 diabetes mellitus without complications: Secondary | ICD-10-CM | POA: Insufficient documentation

## 2022-01-27 DIAGNOSIS — F332 Major depressive disorder, recurrent severe without psychotic features: Secondary | ICD-10-CM | POA: Diagnosis not present

## 2022-01-27 LAB — HEMOGLOBIN A1C
Hgb A1c MFr Bld: 5.1 % (ref 4.8–5.6)
Mean Plasma Glucose: 99.67 mg/dL

## 2022-01-27 LAB — GLUCOSE, CAPILLARY: Glucose-Capillary: 80 mg/dL (ref 70–99)

## 2022-01-27 MED ORDER — SODIUM CHLORIDE 0.9 % IV SOLN
500.0000 mL | Freq: Once | INTRAVENOUS | Status: AC
  Administered 2022-01-27: 500 mL via INTRAVENOUS

## 2022-01-27 MED ORDER — SODIUM CHLORIDE 0.9 % IV SOLN: INTRAVENOUS | Status: DC | PRN

## 2022-01-27 MED ORDER — FAMOTIDINE 20 MG PO TABS
20.0000 mg | ORAL_TABLET | Freq: Every day | ORAL | Status: DC
  Administered 2022-01-27 – 2022-02-03 (×8): 20 mg via ORAL
  Filled 2022-01-27 (×8): qty 1

## 2022-01-27 MED ORDER — MIDAZOLAM HCL 2 MG/2ML IJ SOLN
INTRAMUSCULAR | Status: AC
  Filled 2022-01-27: qty 2

## 2022-01-27 MED ORDER — MIDAZOLAM HCL 2 MG/2ML IJ SOLN
INTRAMUSCULAR | Status: DC | PRN
  Administered 2022-01-27: 2 mg via INTRAVENOUS

## 2022-01-27 MED ORDER — SUCCINYLCHOLINE CHLORIDE 200 MG/10ML IV SOSY
PREFILLED_SYRINGE | INTRAVENOUS | Status: DC | PRN
Start: 2022-01-27 — End: 2022-01-27
  Administered 2022-01-27: 80 mg via INTRAVENOUS

## 2022-01-27 MED ORDER — ROCURONIUM BROMIDE 10 MG/ML (PF) SYRINGE
PREFILLED_SYRINGE | INTRAVENOUS | Status: DC | PRN
  Administered 2022-01-27: 60 mg via INTRAVENOUS

## 2022-01-27 NOTE — BH IP Treatment Plan (Signed)
Interdisciplinary Treatment and Diagnostic Plan Update  01/27/2022 Time of Session: 9:30AM Natasha [name] MRN: [id number]  Principal Diagnosis: Severe recurrent major depression without psychotic features [facility])  Secondary Diagnoses: Principal Problem:   Severe recurrent major depression without psychotic features ([place]) Active Problems:   Paraplegia ([place])   History of DVT (deep vein thrombosis)   Muscle spasticity   Right foot drop   Current Medications:  Current Facility-Administered Medications  Medication Dose Route Frequency Provider Last Rate Last Admin   acetaminophen (TYLENOL) tablet 650 mg  650 mg Oral Q6H PRN Clapacs, Madie Reno, MD       alum & mag hydroxide-simeth (MAALOX/MYLANTA) 200-200-20 MG/5ML suspension 30 mL  30 mL Oral Q4H PRN Clapacs, Madie Reno, MD       baclofen (LIORESAL) tablet 10 mg  10 mg Oral TID Clapacs, Madie Reno, MD   10 mg at 01/27/22 0853   buPROPion (WELLBUTRIN XL) 24 hr tablet 150 mg  150 mg Oral BID Clapacs, John T, MD   150 mg at 01/27/22 0854   busPIRone (BUSPAR) tablet 10 mg  10 mg Oral TID Clapacs, Madie Reno, MD   10 mg at 01/27/22 0854   cariprazine (VRAYLAR) capsule 1.5 mg  1.5 mg Oral Daily Clapacs, Madie Reno, MD   1.5 mg at 01/27/22 0853   clonazePAM (KLONOPIN) tablet 0.5 mg  0.5 mg Oral Daily PRN Clapacs, Madie Reno, MD       DULoxetine (CYMBALTA) DR capsule 120 mg  120 mg Oral Daily Clapacs, Madie Reno, MD   120 mg at 01/27/22 0854   famotidine (PEPCID) tablet 20 mg  20 mg Oral Daily Clapacs, Madie Reno, MD   20 mg at 01/27/22 1027   hydrOXYzine (ATARAX) tablet 25 mg  25 mg Oral Q6H PRN Clapacs, Madie Reno, MD       hydrOXYzine (ATARAX) tablet 50 mg  50 mg Oral Q6H PRN Clapacs, John T, MD       magnesium hydroxide (MILK OF MAGNESIA) suspension 30 mL  30 mL Oral Daily PRN Clapacs, Madie Reno, MD       meloxicam (MOBIC) tablet 15 mg  15 mg Oral Daily Clapacs, John T, MD   15 mg at 01/27/22 0854   QUEtiapine (SEROQUEL) tablet 100 mg  100 mg Oral QHS Clapacs, John T, MD    100 mg at 01/26/22 2107   SUMAtriptan (IMITREX) tablet 100 mg  100 mg Oral Q2H PRN Clapacs, Madie Reno, MD       traZODone (DESYREL) tablet 100 mg  100 mg Oral QHS PRN Clapacs, Madie Reno, MD       PTA Medications: Medications Prior to Admission  Medication Sig Dispense Refill Last Dose   baclofen (LIORESAL) 10 MG tablet Take 1 tablet (10 mg total) by mouth 3 (three) times daily. 90 each 0 01/26/2022   buPROPion (WELLBUTRIN XL) 150 MG 24 hr tablet Take 150 mg by mouth in the morning and at bedtime.   01/26/2022   busPIRone (BUSPAR) 10 MG tablet Take 1 tablet (10 mg total) by mouth 3 (three) times daily. 90 tablet 0 01/26/2022   clonazePAM (KLONOPIN) 0.5 MG tablet Take 1 tablet (0.5 mg total) by mouth daily as needed for anxiety. 30 tablet 0 01/26/2022   diazepam (VALIUM) 5 MG tablet Take 5 mg by mouth 2 (two) times daily as needed.   01/26/2022   DULoxetine (CYMBALTA) 60 MG capsule Take 2 capsules (120 mg total) by mouth daily. 60 capsule 0 01/26/2022  hydrOXYzine (ATARAX/VISTARIL) 25 MG tablet Take 1 tablet (25 mg total) by mouth every 6 (six) hours as needed for anxiety. 30 tablet 0 01/26/2022   meloxicam (MOBIC) 15 MG tablet Take 1 tablet (15 mg total) by mouth daily. 30 tablet 0 01/26/2022   ondansetron (ZOFRAN-ODT) 4 MG disintegrating tablet Take 4 mg by mouth every 8 (eight) hours as needed.   01/26/2022   QUEtiapine (SEROQUEL) 100 MG tablet Take 1 tablet (100 mg total) by mouth at bedtime. 30 tablet 0 01/26/2022   SUMAtriptan (IMITREX) 100 MG tablet Take 100 mg by mouth every 2 (two) hours as needed for migraine.   01/26/2022   VRAYLAR 1.5 MG capsule Take 1 capsule by mouth daily.   01/26/2022    Patient Stressors: Educational concerns   Financial difficulties   Health problems   Traumatic event    Patient Strengths: Ability for insight  Capable of independent living  Communication skills  Motivation for treatment/growth   Treatment Modalities: Medication Management, Group therapy, Case  management,  1 to 1 session with clinician, Psychoeducation, Recreational therapy.   Physician Treatment Plan for Primary Diagnosis: Severe recurrent major depression without psychotic features ([place]) Long Term Goal(s): Improvement in symptoms so as ready for discharge   Short Term Goals: Compliance with prescribed medications will improve Ability to verbalize feelings will improve Ability to disclose and discuss suicidal ideas Ability to demonstrate self-control will improve  Medication Management: Evaluate patient's response, side effects, and tolerance of medication regimen.  Therapeutic Interventions: 1 to 1 sessions, Unit Group sessions and Medication administration.  Evaluation of Outcomes: Not Met  Physician Treatment Plan for Secondary Diagnosis: Principal Problem:   Severe recurrent major depression without psychotic features ([place]) Active Problems:   Paraplegia ([place])   History of DVT (deep vein thrombosis)   Muscle spasticity   Right foot drop  Long Term Goal(s): Improvement in symptoms so as ready for discharge   Short Term Goals: Compliance with prescribed medications will improve Ability to verbalize feelings will improve Ability to disclose and discuss suicidal ideas Ability to demonstrate self-control will improve     Medication Management: Evaluate patient's response, side effects, and tolerance of medication regimen.  Therapeutic Interventions: 1 to 1 sessions, Unit Group sessions and Medication administration.  Evaluation of Outcomes: Not Met   RN Treatment Plan for Primary Diagnosis: Severe recurrent major depression without psychotic features ([place]) Long Term Goal(s): Knowledge of disease and therapeutic regimen to maintain health will improve  Short Term Goals: Ability to remain free from injury will improve, Ability to demonstrate self-control, Ability to participate in decision making will improve, Ability to verbalize feelings will improve, Ability to  disclose and discuss suicidal ideas, Ability to identify and develop effective coping behaviors will improve, and Compliance with prescribed medications will improve  Medication Management: RN will administer medications as ordered by provider, will assess and evaluate patient's response and provide education to patient for prescribed medication. RN will report any adverse and/or side effects to prescribing provider.  Therapeutic Interventions: 1 on 1 counseling sessions, Psychoeducation, Medication administration, Evaluate responses to treatment, Monitor vital signs and CBGs as ordered, Perform/monitor CIWA, COWS, AIMS and Fall Risk screenings as ordered, Perform wound care treatments as ordered.  Evaluation of Outcomes: Not Met   LCSW Treatment Plan for Primary Diagnosis: Severe recurrent major depression without psychotic features ([place]) Long Term Goal(s): Safe transition to appropriate next level of care at discharge, Engage patient in therapeutic group addressing interpersonal concerns.  Short Term Goals:  Engage patient in aftercare planning with referrals and resources, Increase social support, Increase ability to appropriately verbalize feelings, Increase emotional regulation, Facilitate acceptance of mental health diagnosis and concerns, and Increase skills for wellness and recovery  Therapeutic Interventions: Assess for all discharge needs, 1 to 1 time with Social worker, Explore available resources and support systems, Assess for adequacy in community support network, Educate family and significant other(s) on suicide prevention, Complete Psychosocial Assessment, Interpersonal group therapy.  Evaluation of Outcomes: Not Met   Progress in Treatment: Attending groups: No. Participating in groups: No. Taking medication as prescribed: Yes. Toleration medication: Yes. Family/Significant other contact made: No, will contact:  once permission is given Patient understands diagnosis:  Yes. Discussing patient identified problems/goals with staff: Yes. Medical problems stabilized or resolved: Yes. Denies suicidal/homicidal ideation: Yes. Issues/concerns per patient self-inventory: No. Other: none  New problem(s) identified: No, Describe:  none  New Short Term/Long Term Goal(s):  medication management for mood stabilization; elimination of SI thoughts; development of comprehensive mental wellness/sobriety plan.   Patient Goals:  "to utilize treatment so I can get back to independence"  Discharge Plan or Barriers: CSW will assist patient in developing appropraite discharge plans.    Reason for Continuation of Hospitalization: Anxiety Depression Medical Issues Medication stabilization Suicidal ideation  Estimated Length of Stay:  1-7 days   Scribe for Treatment Team: Gates Rigg 01/27/2022 12:36 PM

## 2022-01-27 NOTE — Procedures (Signed)
ECT SERVICES Physicians Interval Evaluation & Treatment Note  Patient Identification: Natasha [name] MRN:  [id number] Date of Evaluation:  01/27/2022 TX #: 1  MADRS:   MMSE:   P.E. Findings:  Patient is quadriplegic with some residual use of arms.  Physically otherwise stable  Psychiatric Interval Note:  Depressed with suicidal ideation but not psychotic and showing good insight  Subjective:  Patient is a 51 y.o. female seen for evaluation for Electroconvulsive Therapy. Agreeable to ECT  Treatment Summary:   [x]   Right Unilateral             []  Bilateral   % Energy : 0.3 ms 55%   Impedance: 1830 ohms  Seizure Energy Index: 9807 V squared  Postictal Suppression Index: 75%  Seizure [place] Index: 94%  Medications  Pre Shock: Brevital 60 mg succinylcholine 80 mg  Post Shock: Versed 2 mg labetalol 10 mg  Seizure Duration: 37 seconds EMG 61 seconds EEG   Comments: Next treatment Friday  Lungs:  [x]   Clear to auscultation               []  Other:   Heart:    [x]   Regular rhythm             []  irregular rhythm    [x]   Previous H&P reviewed, patient examined and there are NO CHANGES                 []   Previous H&P reviewed, patient examined and there are changes noted.   , MD 2/15/20235:31 PM

## 2022-01-27 NOTE — Progress Notes (Signed)
Recreation Therapy Notes    Date: 01/27/2022  Time: 10:45 am    Location: Craft room  Behavioral response: N/A   Intervention Topic: Stress Management   Discussion/Intervention: Patient refused to attend group.   Clinical Observations/Feedback:  Patient refused to attend group.    Livio Ledwith LRT/CTRS        Mignonne Afonso 01/27/2022 11:51 AM

## 2022-01-27 NOTE — Group Note (Signed)
LCSW Group Therapy Note  Group Date: 01/27/2022 Start Time: 1300 End Time: 1400   Type of Therapy and Topic:  Group Therapy - How To Cope with Nervousness about Discharge   Participation Level:  Did Not Attend   Description of Group This process group involved identification of patients' feelings about discharge. Some of them are scheduled to be discharged soon, while others are new admissions, but each of them was asked to share thoughts and feelings surrounding discharge from the hospital. One common theme was that they are excited at the prospect of going home, while another was that many of them are apprehensive about sharing why they were hospitalized. Patients were given the opportunity to discuss these feelings with their peers in preparation for discharge.  Therapeutic Goals  Patient will identify their overall feelings about pending discharge. Patient will think about how they might proactively address issues that they believe will once again arise once they get home (i.e. with parents). Patients will participate in discussion about having hope for change.   Summary of Patient Progress:   X   Therapeutic Modalities Cognitive Behavioral Therapy   Rozann Lesches, LCSWA 01/27/2022  2:15 PM

## 2022-01-27 NOTE — Progress Notes (Signed)
Patient calm and pleasant during assessment. Pt denies SI/HI/AVH. Pt endorses depression and anxiety. Patient stated to this writer that ECT went well but she wants to talk to the MD about a Neurology consult, Pt given education. Pt given education, support, and encouragement to be active in her treatment plan. Pt being monitored Q 15 minutes for safety per unit protocol. Pt remains safe on the unit.

## 2022-01-27 NOTE — Plan of Care (Signed)
Patient tolerated ECT. Patient appropriate with staff & peers. Denies SI,HI and AVH. ADLs maintained independently. Appetite fair. Energy level good. No issues verbalized. Support and encouragement given.

## 2022-01-27 NOTE — Progress Notes (Signed)
[facility] MD Progress Note  01/27/2022 5:08 PM Natasha [name]  MRN:  [id number] Subjective: Follow-up patient with recurrent severe depression.  Patient seen and chart reviewed.  Continues to report depressed mood with intrusive suicidal thoughts but good insight.  No intent to act on it in the hospital.  Patient had no new physical complaints.  She was agreeable to ECT which was to start today. Principal Problem: Severe recurrent major depression without psychotic features ([place]) Diagnosis: Principal Problem:   Severe recurrent major depression without psychotic features ([place]) Active Problems:   Paraplegia ([facility])   History of DVT (deep vein thrombosis)   Muscle spasticity   Right foot drop  Total Time spent with patient: 30 minutes  Past Psychiatric History: Past history of severe recurrent depression and suicide attempts  Past Medical History:  Past Medical History:  Diagnosis Date   Anxiety    C6 spinal cord injury ([place])    Depression    OCD (obsessive compulsive disorder)    PTSD (post-traumatic stress disorder)     Past Surgical History:  Procedure Laterality Date   BACK SURGERY     NECK SURGERY     Family History:  Family History  Problem Relation Age of Onset   Alcohol abuse Father    Alcohol abuse Brother    Depression Mother    Alcohol abuse Mother    Parkinson's disease Mother    Family Psychiatric  History: See previous Social History:  Social History   Substance and Sexual Activity  Alcohol Use Yes   Alcohol/week: 0.0 standard drinks   Comment: Occasional use     Social History   Substance and Sexual Activity  Drug Use No    Social History   Socioeconomic History   Marital status: Single    Spouse name: Not on file   Number of children: Not on file   Years of education: Not on file   Highest education level: Not on file  Occupational History   Occupation: Disability  Tobacco Use   Smoking status: Former    Packs/day: 0.00    Types:  Cigarettes   Smokeless tobacco: Never  Vaping Use   Vaping Use: Never used  Substance and Sexual Activity   Alcohol use: Yes    Alcohol/week: 0.0 standard drinks    Comment: Occasional use   Drug use: No   Sexual activity: Not Currently  Other Topics Concern   Not on file  Social History Narrative   Pt lives alone; receives outpatient psychiatry services through [facility].  Pt is on disability due to a spinal cord injury.    Social Determinants of Health   Financial Resource Strain: Not on file  Food Insecurity: Not on file  Transportation Needs: Not on file  Physical Activity: Not on file  Stress: Not on file  Social Connections: Not on file   Additional Social History:                         Sleep: Fair  Appetite:  Fair  Current Medications: Current Facility-Administered Medications  Medication Dose Route Frequency Provider Last Rate Last Admin   acetaminophen (TYLENOL) tablet 650 mg  650 mg Oral Q6H PRN [place]rose Kearse T, MD       alum & mag hydroxide-simeth (MAALOX/MYLANTA) 200-200-20 MG/5ML suspension 30 mL  30 mL Oral Q4H PRN Trang Bouse T, MD       baclofen (LIORESAL) tablet 10 mg  10 mg Oral  TID Rozalia Dino, Madie Reno, MD   10 mg at 01/27/22 1642   buPROPion (WELLBUTRIN XL) 24 hr tablet 150 mg  150 mg Oral BID Slayde Brault, Madie Reno, MD   150 mg at 01/27/22 1641   busPIRone (BUSPAR) tablet 10 mg  10 mg Oral TID Warnell Rasnic, Madie Reno, MD   10 mg at 01/27/22 1642   clonazePAM (KLONOPIN) tablet 0.5 mg  0.5 mg Oral Daily PRN Caleigh Rabelo, Madie Reno, MD       DULoxetine (CYMBALTA) DR capsule 120 mg  120 mg Oral Daily Bich Mchaney T, MD   120 mg at 01/27/22 0854   famotidine (PEPCID) tablet 20 mg  20 mg Oral Daily Billi Bright T, MD   20 mg at 01/27/22 1027   hydrOXYzine (ATARAX) tablet 25 mg  25 mg Oral Q6H PRN Jhon Mallozzi, Madie Reno, MD       hydrOXYzine (ATARAX) tablet 50 mg  50 mg Oral Q6H PRN Kamryn Messineo T, MD       magnesium hydroxide (MILK OF MAGNESIA) suspension 30 mL  30 mL  Oral Daily PRN Margrete Delude T, MD       meloxicam (MOBIC) tablet 15 mg  15 mg Oral Daily Alazay Leicht, Madie Reno, MD   15 mg at 01/27/22 0854   QUEtiapine (SEROQUEL) tablet 100 mg  100 mg Oral QHS Orson Rho T, MD   100 mg at 01/26/22 2107   SUMAtriptan (IMITREX) tablet 100 mg  100 mg Oral Q2H PRN Wallice Granville, Madie Reno, MD       traZODone (DESYREL) tablet 100 mg  100 mg Oral QHS PRN Peta Peachey, Madie Reno, MD        Lab Results:  Results for orders placed or performed during the hospital encounter of 01/26/22 (from the past 48 hour(s))  Glucose, capillary     Status: None   Collection Time: 01/27/22  6:51 AM  Result Value Ref Range   Glucose-Capillary 80 70 - 99 mg/dL    Comment: Glucose reference range applies only to samples taken after fasting for at least 8 hours.  Hemoglobin A1c     Status: None   Collection Time: 01/27/22  6:59 AM  Result Value Ref Range   Hgb A1c MFr Bld 5.1 4.8 - 5.6 %    Comment: (NOTE) Pre diabetes:          5.7%-6.4%  Diabetes:              >6.4%  Glycemic control for   <7.0% adults with diabetes    Mean Plasma Glucose 99.67 mg/dL    Comment: Performed at [facility] [address] [place], [place] [address]    Blood Alcohol level:  Lab Results  Component Value Date   ETH 23 (H) 01/25/2022   ETH <10 [id number]    Metabolic Disorder Labs: Lab Results  Component Value Date   HGBA1C 5.1 01/27/2022   MPG 99.67 01/27/2022   MPG 85.32 10/16/2020   Lab Results  Component Value Date   PROLACTIN 121.9 (H) 02/03/2017   Lab Results  Component Value Date   CHOL 236 (H) 10/16/2020   TRIG 94 10/16/2020   HDL 78 10/16/2020   CHOLHDL 3.0 10/16/2020   VLDL 19 10/16/2020   LDLCALC 139 (H) 10/16/2020   LDLCALC 160 (H) 05/26/2020    Physical Findings: AIMS:  , ,  ,  ,    CIWA:    COWS:     Musculoskeletal: Strength & Muscle Tone: within normal  limits Gait & Station: normal Patient leans: N/A  Psychiatric Specialty Exam:  Presentation  General  Appearance: Appropriate for Environment  Eye Contact:Good  Speech:Clear and Coherent  Speech Volume:Normal  Handedness:Right   Mood and Affect  Mood:Depressed  Affect:Blunt; Congruent   Thought Process  Thought Processes:Coherent  Descriptions of Associations:Intact  Orientation:Full (Time, Place and Person)  Thought Content:Logical  History of Schizophrenia/Schizoaffective disorder:No  Duration of Psychotic Symptoms:No data recorded Hallucinations:Hallucinations: None  Ideas of Reference:None  Suicidal Thoughts:Suicidal Thoughts: Yes, Active SI Active Intent and/or Plan: With Intent; With Plan; With Means to [place]; With Access to Means  Homicidal Thoughts:Homicidal Thoughts: No   Sensorium  Memory:Immediate Good; Recent Good; Remote Good  Judgment:Fair  Insight:Poor   Executive Functions  Concentration:Good  Attention Span:Good  [place]  Language:Good   Psychomotor Activity  Psychomotor Activity:Psychomotor Activity: Decreased   Assets  Assets:Communication Skills; Physical Health; Resilience; Social Support; Vocational/Educational   Sleep  Sleep:Sleep: Good Number of Hours of Sleep: 8    Physical Exam: Physical Exam Vitals and nursing note reviewed.  Constitutional:      Appearance: Normal appearance.  HENT:     Head: Normocephalic and atraumatic.     Mouth/Throat:     Pharynx: Oropharynx is clear.  Eyes:     Pupils: Pupils are equal, round, and reactive to light.  Cardiovascular:     Rate and Rhythm: Normal rate and regular rhythm.  Pulmonary:     Effort: Pulmonary effort is normal.     Breath sounds: Normal breath sounds.  Abdominal:     General: Abdomen is flat.     Palpations: Abdomen is soft.  Musculoskeletal:        General: Normal range of motion.  Skin:    General: Skin is warm and dry.  Neurological:     Mental Status: She is alert. Mental status is at baseline.  Psychiatric:         Attention and Perception: Attention normal.        Mood and Affect: Mood is depressed. Affect is blunt.        Speech: Speech normal.        Behavior: Behavior is cooperative.        Thought Content: Thought content includes suicidal ideation.        Cognition and Memory: Cognition normal.   Review of Systems  Constitutional: Negative.   HENT: Negative.    Eyes: Negative.   Respiratory: Negative.    Cardiovascular: Negative.   Gastrointestinal: Negative.   Musculoskeletal: Negative.   Skin: Negative.   Neurological: Negative.   Psychiatric/Behavioral:  Positive for depression and suicidal ideas.   Blood pressure 106/76, pulse 100, temperature 97.8 F (36.6 C), temperature source Oral, resp. rate 16, height 5\' 1"  (1.549 m), weight 75.8 kg, SpO2 94 %. Body mass index is 31.55 kg/m.   Treatment Plan Summary: Plan patient had reported that Vraylar had not seemed to be of any benefit.  New dose of it will be discontinued.  ECT today was completed without any complication.  Plan to continue ECT index course while monitoring for improvement in depression.  Alethia Berthold, MD 01/27/2022, 5:08 PM

## 2022-01-27 NOTE — Anesthesia Postprocedure Evaluation (Signed)
Anesthesia Post Note  Patient: Natasha [name]  Procedure(s) Performed: ECT TX  Patient location during evaluation: PACU Anesthesia Type: General Level of consciousness: awake and awake and alert Pain management: pain level controlled Vital Signs Assessment: post-procedure vital signs reviewed and stable Respiratory status: nonlabored ventilation Cardiovascular status: stable Anesthetic complications: no   No notable events documented.   Last Vitals:  Vitals:   01/27/22 1355 01/27/22 1400  BP: (!) 134/109 (!) 131/97  Pulse: (!) 101 96  Resp: 20 20  Temp:  36.4 C  SpO2: 93% 91%    Last Pain:  Vitals:   01/27/22 1400  TempSrc:   PainSc: 0-No pain                 VAN STAVEREN,Odaliz Mcqueary

## 2022-01-27 NOTE — Anesthesia Preprocedure Evaluation (Signed)
Anesthesia Evaluation  Patient identified by MRN, date of birth, ID band Patient awake    Reviewed: Allergy & Precautions, NPO status , Patient's Chart, lab work & pertinent test results  History of Anesthesia Complications Negative for: history of anesthetic complications  Airway Mallampati: II  TM Distance: >3 FB Neck ROM: Full    Dental  (+) Poor Dentition, Dental Advidsory Given   Pulmonary neg shortness of breath, neg sleep apnea, neg COPD, neg recent URI, Current Smoker and Patient abstained from smoking., former smoker,    breath sounds clear to auscultation- rhonchi (-) wheezing      Cardiovascular Exercise Tolerance: Good (-) hypertension(-) angina(-) CAD, (-) Past MI, (-) Cardiac Stents and (-) CABG  Rhythm:Regular Rate:Normal - Systolic murmurs and - Diastolic murmurs    Neuro/Psych neg Seizures PSYCHIATRIC DISORDERS Anxiety Depression negative neurological ROS     GI/Hepatic negative GI ROS, Neg liver ROS,   Endo/Other  negative endocrine ROSneg diabetes  Renal/GU negative Renal ROS     Musculoskeletal negative musculoskeletal ROS (+)   Abdominal (+) - obese,   Peds  Hematology negative hematology ROS (+)   Anesthesia Other Findings Past Medical History: No date: Anxiety No date: C6 spinal cord injury ([place]) No date: Depression   Reproductive/Obstetrics                             Anesthesia Physical  Anesthesia Plan  ASA: II  Anesthesia Plan: General   Post-op Pain Management:    Induction: Intravenous  PONV Risk Score and Plan: 3 and Ondansetron and TIVA  Airway Management Planned: Mask  Additional Equipment:   Intra-op Plan:   Post-operative Plan:   Informed Consent: I have reviewed the patients History and Physical, chart, labs and discussed the procedure including the risks, benefits and alternatives for the proposed anesthesia with the patient or  authorized representative who has indicated his/her understanding and acceptance.     Dental advisory given  Plan Discussed with: CRNA and Anesthesiologist  Anesthesia Plan Comments:         Anesthesia Quick Evaluation

## 2022-01-27 NOTE — H&P (Signed)
Natasha [name] is an 51 y.o. female.   Chief Complaint: Major depression severe with suicidal ideation HPI: History of recurrent severe depression  Past Medical History:  Diagnosis Date   Anxiety    C6 spinal cord injury ([place])    Depression    OCD (obsessive compulsive disorder)    PTSD (post-traumatic stress disorder)     Past Surgical History:  Procedure Laterality Date   BACK SURGERY     NECK SURGERY      Family History  Problem Relation Age of Onset   Alcohol abuse Father    Alcohol abuse Brother    Depression Mother    Alcohol abuse Mother    Parkinson's disease Mother    Social History:  reports that she has quit smoking. Her smoking use included cigarettes. She has never used smokeless tobacco. She reports current alcohol use. She reports that she does not use drugs.  Allergies: No Known Allergies  Medications Prior to Admission  Medication Sig Dispense Refill   baclofen (LIORESAL) 10 MG tablet Take 1 tablet (10 mg total) by mouth 3 (three) times daily. 90 each 0   buPROPion (WELLBUTRIN XL) 150 MG 24 hr tablet Take 150 mg by mouth in the morning and at bedtime.     busPIRone (BUSPAR) 10 MG tablet Take 1 tablet (10 mg total) by mouth 3 (three) times daily. 90 tablet 0   clonazePAM (KLONOPIN) 0.5 MG tablet Take 1 tablet (0.5 mg total) by mouth daily as needed for anxiety. 30 tablet 0   diazepam (VALIUM) 5 MG tablet Take 5 mg by mouth 2 (two) times daily as needed.     DULoxetine (CYMBALTA) 60 MG capsule Take 2 capsules (120 mg total) by mouth daily. 60 capsule 0   hydrOXYzine (ATARAX/VISTARIL) 25 MG tablet Take 1 tablet (25 mg total) by mouth every 6 (six) hours as needed for anxiety. 30 tablet 0   meloxicam (MOBIC) 15 MG tablet Take 1 tablet (15 mg total) by mouth daily. 30 tablet 0   ondansetron (ZOFRAN-ODT) 4 MG disintegrating tablet Take 4 mg by mouth every 8 (eight) hours as needed.     QUEtiapine (SEROQUEL) 100 MG tablet Take 1 tablet (100 mg total) by  mouth at bedtime. 30 tablet 0   SUMAtriptan (IMITREX) 100 MG tablet Take 100 mg by mouth every 2 (two) hours as needed for migraine.     VRAYLAR 1.5 MG capsule Take 1 capsule by mouth daily.      Results for orders placed or performed during the hospital encounter of 01/26/22 (from the past 48 hour(s))  Glucose, capillary     Status: None   Collection Time: 01/27/22  6:51 AM  Result Value Ref Range   Glucose-Capillary 80 70 - 99 mg/dL    Comment: Glucose reference range applies only to samples taken after fasting for at least 8 hours.  Hemoglobin A1c     Status: None   Collection Time: 01/27/22  6:59 AM  Result Value Ref Range   Hgb A1c MFr Bld 5.1 4.8 - 5.6 %    Comment: (NOTE) Pre diabetes:          5.7%-6.4%  Diabetes:              >6.4%  Glycemic control for   <7.0% adults with diabetes    Mean Plasma Glucose 99.67 mg/dL    Comment: Performed at [facility] [address], [place], [place] [address]   No results found.  Review of Systems  Constitutional: Negative.   HENT: Negative.    Eyes: Negative.   Respiratory: Negative.    Cardiovascular: Negative.   Gastrointestinal: Negative.   Musculoskeletal: Negative.   Skin: Negative.   Neurological: Negative.   Psychiatric/Behavioral:  Positive for dysphoric mood and suicidal ideas.    Blood pressure 106/76, pulse 100, temperature 97.8 F (36.6 C), temperature source Oral, resp. rate 16, height 5\' 1"  (1.549 m), weight 75.8 kg, SpO2 94 %. Physical Exam Vitals and nursing note reviewed.  Constitutional:      Appearance: She is well-developed.  HENT:     Head: Normocephalic and atraumatic.  Eyes:     Conjunctiva/sclera: Conjunctivae normal.     Pupils: Pupils are equal, round, and reactive to light.  Cardiovascular:     Heart sounds: Normal heart sounds.  Pulmonary:     Effort: Pulmonary effort is normal.  Abdominal:     Palpations: Abdomen is soft.  Musculoskeletal:        General: Normal range of  motion.     Cervical back: Normal range of motion.  Skin:    General: Skin is warm and dry.  Neurological:     Mental Status: She is alert.  Psychiatric:        Attention and Perception: Attention normal.        Mood and Affect: Mood is depressed.        Thought Content: Thought content includes suicidal ideation.     Assessment/Plan Continue ECT starting with index course treatment #1 today while monitoring for improvement in mood  Alethia Berthold, MD 01/27/2022, 5:29 PM

## 2022-01-27 NOTE — Transfer of Care (Signed)
Immediate Anesthesia Transfer of Care Note  Patient: Natasha [name]  Procedure(s) Performed: ECT TX  Patient Location: PACU  Anesthesia Type:General  Level of Consciousness: drowsy  Airway & Oxygen Therapy: Patient Spontanous Breathing  Post-op Assessment: Report given to RN and Post -op Vital signs reviewed and stable  Post vital signs: Reviewed and stable  Last Vitals:  Vitals Value Taken Time  BP 142/101 01/27/22 1332  Temp    Pulse 65 01/27/22 1322  Resp 18 01/27/22 1334  SpO2 89 % 01/27/22 1322  Vitals shown include unvalidated device data.  Last Pain:  Vitals:   01/27/22 1130  TempSrc:   PainSc: 0-No pain         Complications: No notable events documented.

## 2022-01-28 ENCOUNTER — Other Ambulatory Visit: Payer: Self-pay | Admitting: Psychiatry

## 2022-01-28 DIAGNOSIS — F332 Major depressive disorder, recurrent severe without psychotic features: Secondary | ICD-10-CM | POA: Diagnosis not present

## 2022-01-28 NOTE — [facility] Suicide Risk Assessment (Signed)
[facility] INPATIENT:  Family/Significant Other Suicide Prevention Education  Suicide Prevention Education:  Patient Refusal for Family/Significant Other Suicide Prevention Education: The patient Natasha [name] has refused to provide written consent for family/significant other to be provided Family/Significant Other Suicide Prevention Education during admission and/or prior to discharge.  Physician notified.  CSW completed SPE with patient. Discussed potential triggers leading to suicidal ideation in addition to coping skills one might use in order to delay and distract self from self harming behaviors. CSW encouraged patient to utilize emergency services if they felt unable to maintain their safety. SPE flyer provided to patient at this time.   Corky Crafts 01/28/2022, 9:07 AM
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
## 2022-01-28 NOTE — Progress Notes (Signed)
Recreation Therapy Notes  Date: 01/28/2022  Time: 10:45am   Location: Court yard   Behavioral response: N/A   Intervention Topic: Strengths    Discussion/Intervention: Patient refused to attend group.   Clinical Observations/Feedback:  Patient refused to attend group.    Daymein Nunnery LRT/CTRS          Chalon Zobrist 01/28/2022 11:57 AM

## 2022-01-28 NOTE — Progress Notes (Signed)
Patient calm and pleasant during assessment. Pt denies SI/HI/AVH. Pt endorses depression and anxiety. Patient stated she is prepared for ECT tomorrow. Pt given education, support, and encouragement to be active in her treatment plan. Pt being monitored Q 15 minutes for safety per unit protocol. Pt remains safe on the unit. Pt compliant with medication administration per MD orders

## 2022-01-28 NOTE — Group Note (Signed)
[facility] LCSW Group Therapy Note   Group Date: 01/28/2022 Start Time: 1300 End Time: 1400   Type of Therapy/Topic:  Group Therapy:  Balance in Life  Participation Level:  Active   Description of Group:    This group will address the concept of balance and how it feels and looks when one is unbalanced. Patients will be encouraged to process areas in their lives that are out of balance, and identify reasons for remaining unbalanced. Facilitators will guide patients utilizing problem- solving interventions to address and correct the stressor making their life unbalanced. Understanding and applying boundaries will be explored and addressed for obtaining  and maintaining a balanced life. Patients will be encouraged to explore ways to assertively make their unbalanced needs known to significant others in their lives, using other group members and facilitator for support and feedback.  Therapeutic Goals: Patient will identify two or more emotions or situations they have that consume much of in their lives. Patient will identify signs/triggers that life has become out of balance:  Patient will identify two ways to set boundaries in order to achieve balance in their lives:  Patient will demonstrate ability to communicate their needs through discussion and/or role plays  Summary of Patient Progress: Patient was present in group. She shared that when she thinks of 'balance" she thinks of "completing lists". She reports that she needs struggles with sleeping and eating and that this sometimes affects her balance. She reports that she thinks "relationships, conflict, lack of sleep and feelings of being alone" are triggers to her feeling out of balance.  She reports that her "friends" have been good active supports for her and assist her in maintaining her balance.    Therapeutic Modalities:   Cognitive Behavioral Therapy Solution-Focused Therapy Assertiveness Training   Harden Mo, LCSW

## 2022-01-28 NOTE — Progress Notes (Signed)
[facility] MD Progress Note  01/28/2022 11:38 AM Natasha [name]  MRN:  [id number] Subjective: Follow-up 51 year old woman with severe recurrent depression.  Patient seen and chart reviewed.  Patient had ECT treatment yesterday without complication.  On interview today she says she is already feeling slightly better.  Not back to baseline however.  Had transient memory problems after the treatment but now feels more normal.  No other side effects reported Principal Problem: Severe recurrent major depression without psychotic features (HCC) Diagnosis: Principal Problem:   Severe recurrent major depression without psychotic features (HCC) Active Problems:   Paraplegia (HCC)   History of DVT (deep vein thrombosis)   Muscle spasticity   Right foot drop  Total Time spent with patient: 30 minutes  Past Psychiatric History: Past history of severe chronic depression recurrent with previous suicide attempts  Past Medical History:  Past Medical History:  Diagnosis Date   Anxiety    C6 spinal cord injury (HCC)    Depression    OCD (obsessive compulsive disorder)    PTSD (post-traumatic stress disorder)     Past Surgical History:  Procedure Laterality Date   BACK SURGERY     NECK SURGERY     Family History:  Family History  Problem Relation Age of Onset   Alcohol abuse Father    Alcohol abuse Brother    Depression Mother    Alcohol abuse Mother    Parkinson's disease Mother    Family Psychiatric  History: See previous Social History:  Social History   Substance and Sexual Activity  Alcohol Use Yes   Alcohol/week: 0.0 standard drinks   Comment: Occasional use     Social History   Substance and Sexual Activity  Drug Use No    Social History   Socioeconomic History   Marital status: Single    Spouse name: Not on file   Number of children: Not on file   Years of education: Not on file   Highest education level: Not on file  Occupational History   Occupation: Disability   Tobacco Use   Smoking status: Former    Packs/day: 0.00    Types: Cigarettes   Smokeless tobacco: Never  Vaping Use   Vaping Use: Never used  Substance and Sexual Activity   Alcohol use: Yes    Alcohol/week: 0.0 standard drinks    Comment: Occasional use   Drug use: No   Sexual activity: Not Currently  Other Topics Concern   Not on file  Social History Narrative   Pt lives alone; receives outpatient psychiatry services through [facility].  Pt is on disability due to a spinal cord injury.    Social Determinants of Health   Financial Resource Strain: Not on file  Food Insecurity: Not on file  Transportation Needs: Not on file  Physical Activity: Not on file  Stress: Not on file  Social Connections: Not on file   Additional Social History:                         Sleep: Fair  Appetite:  Fair  Current Medications: Current Facility-Administered Medications  Medication Dose Route Frequency Provider Last Rate Last Admin   acetaminophen (TYLENOL) tablet 650 mg  650 mg Oral Q6H PRN Keia Rask T, MD       alum & mag hydroxide-simeth (MAALOX/MYLANTA) 200-200-20 MG/5ML suspension 30 mL  30 mL Oral Q4H PRN Dailyn Reith, Jackquline Denmark, MD       baclofen (LIORESAL)  tablet 10 mg  10 mg Oral TID Hjalmer Iovino T, MD   10 mg at 01/28/22 1108   buPROPion (WELLBUTRIN XL) 24 hr tablet 150 mg  150 mg Oral BID Leina Babe T, MD   150 mg at 01/27/22 1641   busPIRone (BUSPAR) tablet 10 mg  10 mg Oral TID Creedence Kunesh, Jackquline Denmark, MD   10 mg at 01/28/22 1108   clonazePAM (KLONOPIN) tablet 0.5 mg  0.5 mg Oral Daily PRN Oskar Cretella, Jackquline Denmark, MD       DULoxetine (CYMBALTA) DR capsule 120 mg  120 mg Oral Daily Giuseppina Quinones T, MD   120 mg at 01/28/22 0742   famotidine (PEPCID) tablet 20 mg  20 mg Oral Daily Halsey Hammen T, MD   20 mg at 01/28/22 7078   hydrOXYzine (ATARAX) tablet 25 mg  25 mg Oral Q6H PRN Porshia Blizzard, Jackquline Denmark, MD       hydrOXYzine (ATARAX) tablet 50 mg  50 mg Oral Q6H PRN Ariadne Rissmiller T,  MD       magnesium hydroxide (MILK OF MAGNESIA) suspension 30 mL  30 mL Oral Daily PRN Kourosh Jablonsky T, MD       meloxicam (MOBIC) tablet 15 mg  15 mg Oral Daily Havana Baldwin, Jackquline Denmark, MD   15 mg at 01/28/22 0743   QUEtiapine (SEROQUEL) tablet 100 mg  100 mg Oral QHS Leonor Darnell T, MD   100 mg at 01/27/22 2106   SUMAtriptan (IMITREX) tablet 100 mg  100 mg Oral Q2H PRN Loraina Stauffer, Jackquline Denmark, MD       traZODone (DESYREL) tablet 100 mg  100 mg Oral QHS PRN Coltyn Hanning, Jackquline Denmark, MD        Lab Results:  Results for orders placed or performed during the hospital encounter of 01/26/22 (from the past 48 hour(s))  Glucose, capillary     Status: None   Collection Time: 01/27/22  6:51 AM  Result Value Ref Range   Glucose-Capillary 80 70 - 99 mg/dL    Comment: Glucose reference range applies only to samples taken after fasting for at least 8 hours.  Hemoglobin A1c     Status: None   Collection Time: 01/27/22  6:59 AM  Result Value Ref Range   Hgb A1c MFr Bld 5.1 4.8 - 5.6 %    Comment: (NOTE) Pre diabetes:          5.7%-6.4%  Diabetes:              >6.4%  Glycemic control for   <7.0% adults with diabetes    Mean Plasma Glucose 99.67 mg/dL    Comment: Performed at [facility], [address] [place], [place] [address]    Blood Alcohol level:  Lab Results  Component Value Date   ETH 23 (H) 01/25/2022   ETH <10 10/16/2020    Metabolic Disorder Labs: Lab Results  Component Value Date   HGBA1C 5.1 01/27/2022   MPG 99.67 01/27/2022   MPG 85.32 10/16/2020   Lab Results  Component Value Date   PROLACTIN 121.9 (H) 02/03/2017   Lab Results  Component Value Date   CHOL 236 (H) 10/16/2020   TRIG 94 10/16/2020   HDL 78 10/16/2020   CHOLHDL 3.0 10/16/2020   VLDL 19 10/16/2020   LDLCALC 139 (H) 10/16/2020   LDLCALC 160 (H) 05/26/2020    Physical Findings: AIMS:  , ,  ,  ,    CIWA:    COWS:  Musculoskeletal: Strength & Muscle Tone: within normal limits Gait & Station:  normal Patient leans: N/A  Psychiatric Specialty Exam:  Presentation  General Appearance: Appropriate for Environment  Eye Contact:Good  Speech:Clear and Coherent  Speech Volume:Normal  Handedness:Right   Mood and Affect  Mood:Depressed  Affect:Blunt; Congruent   Thought Process  Thought Processes:Coherent  Descriptions of Associations:Intact  Orientation:Full (Time, Place and Person)  Thought Content:Logical  History of Schizophrenia/Schizoaffective disorder:No  Duration of Psychotic Symptoms:No data recorded Hallucinations:No data recorded Ideas of Reference:None  Suicidal Thoughts:No data recorded Homicidal Thoughts:No data recorded  Sensorium  Memory:Immediate Good; Recent Good; Remote Good  Judgment:Fair  Insight:Poor   Executive Functions  Concentration:Good  Attention Span:Good  Recall:Good  Fund of Knowledge:Good  Language:Good   Psychomotor Activity  Psychomotor Activity:No data recorded  Assets  Assets:Communication Skills; Physical Health; Resilience; Social Support; Vocational/Educational   Sleep  Sleep:No data recorded   Physical Exam: Physical Exam Vitals reviewed.  Constitutional:      Appearance: Normal appearance.  HENT:     Head: Normocephalic and atraumatic.     Mouth/Throat:     Pharynx: Oropharynx is clear.  Eyes:     Pupils: Pupils are equal, round, and reactive to light.  Cardiovascular:     Rate and Rhythm: Normal rate and regular rhythm.  Pulmonary:     Effort: Pulmonary effort is normal.     Breath sounds: Normal breath sounds.  Abdominal:     General: Abdomen is flat.     Palpations: Abdomen is soft.  Musculoskeletal:        General: Normal range of motion.  Skin:    General: Skin is warm and dry.  Neurological:     Mental Status: She is alert. Mental status is at baseline.     Comments: No change from baseline  Psychiatric:        Attention and Perception: Attention normal.        Mood and  Affect: Mood normal.        Speech: Speech normal.        Behavior: Behavior normal.        Thought Content: Thought content normal.        Cognition and Memory: Cognition normal.   Review of Systems  Constitutional: Negative.   HENT: Negative.    Eyes: Negative.   Respiratory: Negative.    Cardiovascular: Negative.   Gastrointestinal: Negative.   Musculoskeletal: Negative.   Skin: Negative.   Neurological: Negative.   Psychiatric/Behavioral:  Positive for depression and memory loss. Negative for hallucinations, substance abuse and suicidal ideas. The patient is not nervous/anxious.   Blood pressure 120/88, pulse 82, temperature 97.8 F (36.6 C), temperature source Oral, resp. rate 17, height 5\' 1"  (1.549 m), weight 75.8 kg, SpO2 100 %. Body mass index is 31.55 kg/m.   Treatment Plan Summary: Medication management and Plan no change to overall plan for ECT with next treatment tomorrow and likely a third 1 on Monday.  Patient has reported that the Wellbutrin had previously been discontinued and we had thought that that could be a cause of some of her tremor so I am going to stop that medicine.  No other change to medication noted for today.  Encourage group attendance.  Sunday, MD 01/28/2022, 11:38 AM

## 2022-01-28 NOTE — Progress Notes (Signed)
°   01/28/22 1300  Clinical Encounter Type  Visited With Patient  Visit Type Initial;Spiritual support;Social support  Referral From Other (Comment) (rounding)  Spiritual Encounters  Spiritual Needs Other (Comment) (social support, empowerment)   Chaplain Burris engaged Pt for the first time in the dayroom. Chaplain inquired about Pt well-being and whether she might have gone outside for group. This opened an opportunity to touch upon choices and to valid Pt's right to make choices, to have needs and preferences that differ. Pt made insightful honest remark about going outside not being easy in wheelchair then corrected to say "but that's an excuse." This chaplain validated the choice and encouraged her to feel empowered to make choices that best serve her needs.  Left conversation with invitation for f/u at a later time.

## 2022-01-28 NOTE — Progress Notes (Signed)
Patient is calm and cooperative with assessment. She states that she is feeling better since doing ECT. Patient denies SI, HI, and AVH. She rates depression as a 6/10. Patient states she is sleeping well, but still does not have an appetite.   Patient was educated on medications. Patient states that she had been taken off Wellbutrin in the outpatient setting. Patient was compliant with all medications except Wellbutrin.   Patient is active on the unit and is observed to be interacting appropriately with staff and other patients. Patient remains safe on the unit at this time.

## 2022-01-29 ENCOUNTER — Encounter: Payer: Self-pay | Admitting: Psychiatry

## 2022-01-29 ENCOUNTER — Inpatient Hospital Stay: Payer: 59 | Admitting: Certified Registered Nurse Anesthetist

## 2022-01-29 ENCOUNTER — Ambulatory Visit: Payer: 59 | Attending: Psychiatry

## 2022-01-29 DIAGNOSIS — E119 Type 2 diabetes mellitus without complications: Secondary | ICD-10-CM | POA: Diagnosis not present

## 2022-01-29 DIAGNOSIS — Z86718 Personal history of other venous thrombosis and embolism: Secondary | ICD-10-CM | POA: Insufficient documentation

## 2022-01-29 DIAGNOSIS — M62838 Other muscle spasm: Secondary | ICD-10-CM | POA: Insufficient documentation

## 2022-01-29 DIAGNOSIS — F332 Major depressive disorder, recurrent severe without psychotic features: Secondary | ICD-10-CM | POA: Insufficient documentation

## 2022-01-29 DIAGNOSIS — G8220 Paraplegia, unspecified: Secondary | ICD-10-CM | POA: Insufficient documentation

## 2022-01-29 DIAGNOSIS — M21371 Foot drop, right foot: Secondary | ICD-10-CM | POA: Insufficient documentation

## 2022-01-29 LAB — GLUCOSE, CAPILLARY: Glucose-Capillary: 84 mg/dL (ref 70–99)

## 2022-01-29 MED ORDER — ALBUTEROL SULFATE (2.5 MG/3ML) 0.083% IN NEBU
INHALATION_SOLUTION | RESPIRATORY_TRACT | Status: AC
Start: 2022-01-29 — End: 2022-01-29
  Filled 2022-01-29: qty 3

## 2022-01-29 MED ORDER — MIDAZOLAM HCL 2 MG/2ML IJ SOLN: 2.0000 mg | Freq: Once | INTRAMUSCULAR | Status: DC

## 2022-01-29 MED ORDER — SUCCINYLCHOLINE CHLORIDE 200 MG/10ML IV SOSY
PREFILLED_SYRINGE | INTRAVENOUS | Status: DC | PRN
Start: 2022-01-29 — End: 2022-01-29
  Administered 2022-01-29: 60 mg via INTRAVENOUS

## 2022-01-29 MED ORDER — ONDANSETRON HCL 4 MG/2ML IJ SOLN: 4.0000 mg | Freq: Once | INTRAMUSCULAR | Status: DC | PRN

## 2022-01-29 MED ORDER — SODIUM CHLORIDE 0.9 % IV SOLN: 500.0000 mL | Freq: Once | INTRAVENOUS | Status: AC

## 2022-01-29 MED ORDER — METHOHEXITAL SODIUM 100 MG/10ML IV SOSY
PREFILLED_SYRINGE | INTRAVENOUS | Status: DC | PRN
Start: 2022-01-29 — End: 2022-01-29
  Administered 2022-01-29: 60 mg via INTRAVENOUS

## 2022-01-29 MED ORDER — MIDAZOLAM HCL 2 MG/2ML IJ SOLN
INTRAMUSCULAR | Status: AC
  Filled 2022-01-29: qty 2

## 2022-01-29 NOTE — Anesthesia Preprocedure Evaluation (Signed)
Anesthesia Evaluation  °Patient identified by MRN, date of birth, ID band °Patient awake ° ° ° °Reviewed: °Allergy & Precautions, NPO status , Patient's Chart, lab work & pertinent test results ° °History of Anesthesia Complications °Negative for: history of anesthetic complications ° °Airway °Mallampati: II ° °TM Distance: >3 FB °Neck ROM: Full ° ° ° Dental ° °(+) Poor Dentition, Dental Advidsory Given °  °Pulmonary °neg pulmonary ROS, neg shortness of breath, neg sleep apnea, neg COPD, neg recent URI, Patient abstained from smoking.Not current smoker, former smoker,  °  °breath sounds clear to auscultation- rhonchi °(-) wheezing ° ° ° ° ° Cardiovascular °Exercise Tolerance: Good °METS(-) hypertension(-) angina(-) CAD, (-) Past MI, (-) Cardiac Stents and (-) CABG negative cardio ROS ° °(-) dysrhythmias  °Rhythm:Regular Rate:Normal °- Systolic murmurs and - Diastolic murmurs ° °  °Neuro/Psych °neg Seizures PSYCHIATRIC DISORDERS Anxiety Depression negative neurological ROS °   ° GI/Hepatic °negative GI ROS, Neg liver ROS, neg GERD  ,(+)  °  ° (-) substance abuse ° ,   °Endo/Other  °negative endocrine ROSneg diabetes ° Renal/GU °negative Renal ROS  ° °  °Musculoskeletal °negative musculoskeletal ROS °(+)  ° Abdominal °(+) - obese,   °Peds ° Hematology °negative hematology ROS °(+)   °Anesthesia Other Findings °Past Medical History: °No date: Anxiety °No date: C6 spinal cord injury (HCC) °No date: Depression °No date: OCD (obsessive compulsive disorder) °No date: PTSD (post-traumatic stress disorder) ° Reproductive/Obstetrics ° °  ° ° ° ° ° ° ° ° ° ° ° ° ° °  °  ° ° ° ° ° ° ° ° °Anesthesia Physical ° °Anesthesia Plan ° °ASA: 2 ° °Anesthesia Plan: General  ° °Post-op Pain Management:   ° °Induction: Intravenous ° °PONV Risk Score and Plan: 3 and Ondansetron and TIVA ° °Airway Management Planned: Mask ° °Additional Equipment: None ° °Intra-op Plan:  ° °Post-operative Plan:  ° °Informed  Consent: I have reviewed the patients History and Physical, chart, labs and discussed the procedure including the risks, benefits and alternatives for the proposed anesthesia with the patient or authorized representative who has indicated his/her understanding and acceptance.  ° ° ° °Dental advisory given ° °Plan Discussed with: CRNA and Anesthesiologist ° °Anesthesia Plan Comments: (Discussed risks of anesthesia with patient, including PONV, muscle aches. Rare risks discussed as well, such as cardiorespiratory sequelae, need for airway intervention and its associated risks including lip/dental/eye damage and sore throat, and allergic reactions. Discussed the role of CRNA in patient's perioperative care. Patient understands.)  ° ° ° ° ° ° °Anesthesia Quick Evaluation ° °

## 2022-01-29 NOTE — Anesthesia Procedure Notes (Signed)
Date/Time: 01/29/2022 1:58 PM Performed by: Stormy Fabian, CRNA Pre-anesthesia Checklist: Patient identified, Emergency Drugs available, Suction available and Patient being monitored Patient Re-evaluated:Patient Re-evaluated prior to induction Oxygen Delivery Method: Circle system utilized Preoxygenation: Pre-oxygenation with 100% oxygen Induction Type: IV induction Ventilation: Mask ventilation without difficulty and Mask ventilation throughout procedure Airway Equipment and Method: Bite block Placement Confirmation: positive ETCO2 Dental Injury: Teeth and Oropharynx as per pre-operative assessment

## 2022-01-29 NOTE — Transfer of Care (Signed)
Immediate Anesthesia Transfer of Care Note  Patient: Natasha [name]  Procedure(s) Performed: ECT TX  Patient Location: PACU  Anesthesia Type:General  Level of Consciousness: drowsy  Airway & Oxygen Therapy: Patient Spontanous Breathing and Patient connected to face mask oxygen  Post-op Assessment: Report given to RN and Post -op Vital signs reviewed and stable  Post vital signs: Reviewed and stable  Last Vitals:  Vitals Value Taken Time  BP 150/100 01/29/22 1427  Temp    Pulse 83   Resp 17 01/29/22 1429  SpO2 92   Vitals shown include unvalidated device data.  Last Pain:  Vitals:   01/29/22 1125  TempSrc:   PainSc: 0-No pain      Patients Stated Pain Goal: 0 (01/29/22 0738)  Complications: No notable events documented.

## 2022-01-29 NOTE — Progress Notes (Signed)
Recreation Therapy Notes  INPATIENT RECREATION TR PLAN  Patient Details Name: Rabab Currington MRN: [id number] DOB: 12-Mar-1971 Today's Date: 01/29/2022  Rec Therapy Plan Is patient appropriate for Therapeutic Recreation?: Yes Treatment times per week: At least 3 Estimated Length of Stay: 5-7 days TR Treatment/Interventions: Group participation (Comment)  Discharge Criteria Pt will be discharged from therapy if:: Discharged Treatment plan/goals/alternatives discussed and agreed upon by:: Patient/family  Discharge Summary     Kianni Lheureux 01/29/2022, 3:38 PM

## 2022-01-29 NOTE — Progress Notes (Signed)
[facility] MD Progress Note  01/29/2022 6:24 PM Natasha [name]  MRN:  [id number] Subjective: Follow-up 51 year old woman with severe depression currently in the hospital for ECT.  Patient had her second ECT treatment today.  Treatment was without any complication and well tolerated.  Patient even before ECT was stating that her mood was feeling a little bit better.  Suicidal thoughts were much diminished.  Patient has been up out of her room interacting well with others today.  No new physical complaints. Principal Problem: Severe recurrent major depression without psychotic features (HCC) Diagnosis: Principal Problem:   Severe recurrent major depression without psychotic features (HCC) Active Problems:   Paraplegia (HCC)   History of DVT (deep vein thrombosis)   Muscle spasticity   Right foot drop  Total Time spent with patient: 30 minutes  Past Psychiatric History: Past history of recurrent severe depression  Past Medical History:  Past Medical History:  Diagnosis Date   Anxiety    C6 spinal cord injury (HCC)    Depression    OCD (obsessive compulsive disorder)    PTSD (post-traumatic stress disorder)     Past Surgical History:  Procedure Laterality Date   BACK SURGERY     NECK SURGERY     Family History:  Family History  Problem Relation Age of Onset   Alcohol abuse Father    Alcohol abuse Brother    Depression Mother    Alcohol abuse Mother    Parkinson's disease Mother    Family Psychiatric  History: See previous Social History:  Social History   Substance and Sexual Activity  Alcohol Use Yes   Alcohol/week: 0.0 standard drinks   Comment: Occasional use     Social History   Substance and Sexual Activity  Drug Use No    Social History   Socioeconomic History   Marital status: Single    Spouse name: Not on file   Number of children: Not on file   Years of education: Not on file   Highest education level: Not on file  Occupational History    Occupation: Disability  Tobacco Use   Smoking status: Former    Packs/day: 0.00    Types: Cigarettes   Smokeless tobacco: Never  Vaping Use   Vaping Use: Never used  Substance and Sexual Activity   Alcohol use: Yes    Alcohol/week: 0.0 standard drinks    Comment: Occasional use   Drug use: No   Sexual activity: Not Currently  Other Topics Concern   Not on file  Social History Narrative   Pt lives alone; receives outpatient psychiatry services through [facility].  Pt is on disability due to a spinal cord injury.    Social Determinants of Health   Financial Resource Strain: Not on file  Food Insecurity: Not on file  Transportation Needs: Not on file  Physical Activity: Not on file  Stress: Not on file  Social Connections: Not on file   Additional Social History:                         Sleep: Fair  Appetite:  Fair  Current Medications: Current Facility-Administered Medications  Medication Dose Route Frequency Provider Last Rate Last Admin   acetaminophen (TYLENOL) tablet 650 mg  650 mg Oral Q6H PRN Roger Fasnacht T, MD       alum & mag hydroxide-simeth (MAALOX/MYLANTA) 200-200-20 MG/5ML suspension 30 mL  30 mL Oral Q4H PRN Jackolyn Geron, Jackquline Denmark, MD  baclofen (LIORESAL) tablet 10 mg  10 mg Oral TID Jencarlos Nicolson T, MD   10 mg at 01/29/22 1626   busPIRone (BUSPAR) tablet 10 mg  10 mg Oral TID Maraki Macquarrie, Jackquline Denmark, MD   10 mg at 01/29/22 1626   clonazePAM (KLONOPIN) tablet 0.5 mg  0.5 mg Oral Daily PRN Ariannie Penaloza, Jackquline Denmark, MD       DULoxetine (CYMBALTA) DR capsule 120 mg  120 mg Oral Daily Mikesha Migliaccio T, MD   120 mg at 01/29/22 0738   famotidine (PEPCID) tablet 20 mg  20 mg Oral Daily Demeka Sutter T, MD   20 mg at 01/29/22 2841   hydrOXYzine (ATARAX) tablet 25 mg  25 mg Oral Q6H PRN Margean Korell, Jackquline Denmark, MD       hydrOXYzine (ATARAX) tablet 50 mg  50 mg Oral Q6H PRN Theodoros Stjames T, MD       magnesium hydroxide (MILK OF MAGNESIA) suspension 30 mL  30 mL Oral Daily PRN  Amye Grego T, MD       meloxicam (MOBIC) tablet 15 mg  15 mg Oral Daily Rhylee Nunn T, MD   15 mg at 01/29/22 0738   midazolam (VERSED) injection 2 mg  2 mg Intravenous Once Karlyn Glasco, Jackquline Denmark, MD       ondansetron [facility]) injection 4 mg  4 mg Intravenous Once PRN Corinda Gubler, MD       QUEtiapine (SEROQUEL) tablet 100 mg  100 mg Oral QHS Tuyet Bader T, MD   100 mg at 01/28/22 2110   SUMAtriptan (IMITREX) tablet 100 mg  100 mg Oral Q2H PRN Tulani Kidney, Jackquline Denmark, MD       traZODone (DESYREL) tablet 100 mg  100 mg Oral QHS PRN Nikolaus Pienta, Jackquline Denmark, MD        Lab Results:  Results for orders placed or performed during the hospital encounter of 01/26/22 (from the past 48 hour(s))  Glucose, capillary     Status: None   Collection Time: 01/29/22  6:39 AM  Result Value Ref Range   Glucose-Capillary 84 70 - 99 mg/dL    Comment: Glucose reference range applies only to samples taken after fasting for at least 8 hours.    Blood Alcohol level:  Lab Results  Component Value Date   ETH 23 (H) 01/25/2022   ETH <10 10/16/2020    Metabolic Disorder Labs: Lab Results  Component Value Date   HGBA1C 5.1 01/27/2022   MPG 99.67 01/27/2022   MPG 85.32 10/16/2020   Lab Results  Component Value Date   PROLACTIN 121.9 (H) 02/03/2017   Lab Results  Component Value Date   CHOL 236 (H) 10/16/2020   TRIG 94 10/16/2020   HDL 78 10/16/2020   CHOLHDL 3.0 10/16/2020   VLDL 19 10/16/2020   LDLCALC 139 (H) 10/16/2020   LDLCALC 160 (H) 05/26/2020    Physical Findings: AIMS:  , ,  ,  ,    CIWA:    COWS:     Musculoskeletal: Strength & Muscle Tone:  Patient is quadriplegic with minimal movement in the lower extremities but retains some strength and movement in her arms and upper body. Gait & Station: unable to stand Patient leans: N/A  Psychiatric Specialty Exam:  Presentation  General Appearance: Appropriate for Environment  Eye Contact:Good  Speech:Clear and Coherent  Speech  Volume:Normal  Handedness:Right   Mood and Affect  Mood:Depressed  Affect:Blunt; Congruent   Thought Process  Thought Processes:Coherent  Descriptions of Associations:Intact  Orientation:Full (  Time, Place and Person)  Thought Content:Logical  History of Schizophrenia/Schizoaffective disorder:No  Duration of Psychotic Symptoms:No data recorded Hallucinations:No data recorded Ideas of Reference:None  Suicidal Thoughts:No data recorded Homicidal Thoughts:No data recorded  Sensorium  Memory:Immediate Good; Recent Good; Remote Good  Judgment:Fair  Insight:Poor   Executive Functions  Concentration:Good  Attention Span:Good  Recall:Good  Fund of Knowledge:Good  Language:Good   Psychomotor Activity  Psychomotor Activity:No data recorded  Assets  Assets:Communication Skills; Physical Health; Resilience; Social Support; Vocational/Educational   Sleep  Sleep:No data recorded   Physical Exam: Physical Exam Vitals and nursing note reviewed.  Constitutional:      Appearance: Normal appearance.  HENT:     Head: Normocephalic and atraumatic.     Mouth/Throat:     Pharynx: Oropharynx is clear.  Eyes:     Pupils: Pupils are equal, round, and reactive to light.  Cardiovascular:     Rate and Rhythm: Normal rate and regular rhythm.  Pulmonary:     Effort: Pulmonary effort is normal.     Breath sounds: Normal breath sounds.  Abdominal:     General: Abdomen is flat.     Palpations: Abdomen is soft.  Musculoskeletal:        General: Normal range of motion.  Skin:    General: Skin is warm and dry.  Neurological:     Mental Status: She is alert. Mental status is at baseline.     Motor: Weakness present.  Psychiatric:        Mood and Affect: Mood normal.        Thought Content: Thought content normal.   Review of Systems  Constitutional: Negative.   HENT: Negative.    Eyes: Negative.   Respiratory: Negative.    Cardiovascular: Negative.    Gastrointestinal: Negative.   Musculoskeletal: Negative.   Skin: Negative.   Neurological:  Positive for weakness.  Psychiatric/Behavioral:  Positive for depression. Negative for hallucinations and suicidal ideas.   Blood pressure 122/90, pulse 92, temperature 98.6 F (37 C), temperature source Oral, resp. rate 17, height 5\' 1"  (1.549 m), weight 75.8 kg, SpO2 100 %. Body mass index is 31.55 kg/m.   Treatment Plan Summary: Plan no change to current medication.  Next ECT will be Monday.  Based on previous response there is a good chance that she will be ready for discharge at that time.  Supportive therapy and review of plan with patient  Mordecai Rasmussen, MD 01/29/2022, 6:24 PM

## 2022-01-29 NOTE — Progress Notes (Signed)
Recreation Therapy Notes  INPATIENT RECREATION THERAPY ASSESSMENT  Patient Details Name: Natasha [name] MRN: [id number] DOB: 04/01/1971 Today's Date: 01/29/2022       Information Obtained From: Patient  Able to Participate in Assessment/Interview: Yes  Patient Presentation: Responsive  Reason for Admission (Per Patient): Active Symptoms, Suicidal Ideation  Patient Stressors:    Coping Skills:   Write, Read  Leisure Interests (2+):  Individual - Reading, Individual - Writing  Frequency of Recreation/Participation: Weekly  Awareness of Community Resources:  Yes  Community Resources:  Library  Current Use: No  If no, Barriers?: Attitudinal  Expressed Interest in [employer] Information: Yes  County of Residence:  [place]  Patient Main Form of Transportation: Taxi  Patient Strengths:  N/A  Patient Identified Areas of Improvement:  My depression  Patient Goal for Hospitalization:  Getting ECT  Current SI (including self-harm):  No  Current HI:  No  Current AVH: No  Staff Intervention Plan: Group Attendance, Collaborate with Interdisciplinary Treatment Team  Consent to Intern Participation: N/A  Karanveer Ramakrishnan 01/29/2022, 3:37 PM

## 2022-01-29 NOTE — Procedures (Signed)
ECT SERVICES Physicians Interval Evaluation & Treatment Note  Patient Identification: Natasha [name] MRN:  [id number] Date of Evaluation:  01/29/2022 TX #: 2 in this series  MADRS:   MMSE:   P.E. Findings:  No change to physical exam  Psychiatric Interval Note:  Patient is reporting mood is better and her affect is brighter and more relaxed  Subjective:  Patient is a 51 y.o. female seen for evaluation for Electroconvulsive Therapy. Feeling better no complaints  Treatment Summary:   [x]   Right Unilateral             []  Bilateral   % Energy : 0.3 ms 55%   Impedance: 1460 ohms  Seizure Energy Index: 5865 V squared  Postictal Suppression Index: 68%  Seizure Concordance Index: 97%  Medications  Pre Shock: Brevital 60 mg succinylcholine 80 mg  Post Shock: Versed 2 mg  Seizure Duration: 23 seconds EMG 38 seconds EEG   Comments: Next treatment Monday  Lungs:  [x]   Clear to auscultation               []  Other:   Heart:    [x]   Regular rhythm             []  irregular rhythm    [x]   Previous H&P reviewed, patient examined and there are NO CHANGES                 []   Previous H&P reviewed, patient examined and there are changes noted.   , MD 2/17/20236:27 PM

## 2022-01-29 NOTE — Group Note (Signed)
[facility] LCSW Group Therapy Note ° ° °Group Date: 01/29/2022 °Start Time: 1300 °End Time: 1400 ° °Type of Therapy and Topic:  Group Therapy:  Feelings around Relapse and Recovery ° °Participation Level:  Did Not Attend  ° °Mood: ° °Description of Group:   ° Patients in this group will discuss emotions they experience before and after a relapse. They will process how experiencing these feelings, or avoidance of experiencing them, relates to having a relapse. Facilitator will guide patients to explore emotions they have related to recovery. Patients will be encouraged to process which emotions are more powerful. They will be guided to discuss the emotional reaction significant others in their lives may have to patients’ relapse or recovery. Patients will be assisted in exploring ways to respond to the emotions of others without this contributing to a relapse. ° °Therapeutic Goals: °Patient will identify two or more emotions that lead to relapse for them:  °Patient will identify two emotions that result when they relapse:  °Patient will identify two emotions related to recovery:  °Patient will demonstrate ability to communicate their needs through discussion and/or role plays. ° ° °Summary of Patient Progress: ° °Patient did not attend group despite encouraged participation.  ° ° °Therapeutic Modalities:   °Cognitive Behavioral Therapy °Solution-Focused Therapy °Assertiveness Training °Relapse Prevention Therapy ° ° °Suly Vukelich W Iram Lundberg, LCSWA °

## 2022-01-29 NOTE — Progress Notes (Signed)
D: Pt alert and oriented. Pt rates depression 3/10, hopelessness 2/10, and anxiety 7/10. Pt goal: "ECT." Pt reports energy level as low and concentration as being poor. Pt reports sleep last night as being good. Pt did receive medications for sleep and did find them helpful. Pt reports experiencing 5/10 neck and back pain related to sleeping on our mattresses at this time. Pt denies experiencing any SI/HI, or AVH at this time.   Pt tolerated ECT well. Pt was alert and oriented x 4 after ECT. Pt had no c/o pain and was able to tolerated eat.   A: Scheduled medications administered to pt, per MD orders. Support and encouragement provided. Frequent verbal contact made. Routine safety checks conducted q15 minutes.   R: No adverse drug reactions noted. Pt verbally contracts for safety at this time. Pt complaint with medications and treatment plan. Pt interacts well with others on the unit. Pt remains safe at this time. Will continue to monitor.

## 2022-01-29 NOTE — H&P (Signed)
Natasha [name] is an 51 y.o. female.   Chief Complaint: depression See previous  HPI: see previous  Past Medical History:  Diagnosis Date   Anxiety    C6 spinal cord injury (HCC)    Depression    OCD (obsessive compulsive disorder)    PTSD (post-traumatic stress disorder)     Past Surgical History:  Procedure Laterality Date   BACK SURGERY     NECK SURGERY      Family History  Problem Relation Age of Onset   Alcohol abuse Father    Alcohol abuse Brother    Depression Mother    Alcohol abuse Mother    Parkinson's disease Mother    Social History:  reports that she has quit smoking. Her smoking use included cigarettes. She has never used smokeless tobacco. She reports current alcohol use. She reports that she does not use drugs.  Allergies: No Known Allergies  Medications Prior to Admission  Medication Sig Dispense Refill   baclofen (LIORESAL) 10 MG tablet Take 1 tablet (10 mg total) by mouth 3 (three) times daily. 90 each 0   buPROPion (WELLBUTRIN XL) 150 MG 24 hr tablet Take 150 mg by mouth in the morning and at bedtime.     busPIRone (BUSPAR) 10 MG tablet Take 1 tablet (10 mg total) by mouth 3 (three) times daily. 90 tablet 0   clonazePAM (KLONOPIN) 0.5 MG tablet Take 1 tablet (0.5 mg total) by mouth daily as needed for anxiety. 30 tablet 0   diazepam (VALIUM) 5 MG tablet Take 5 mg by mouth 2 (two) times daily as needed.     DULoxetine (CYMBALTA) 60 MG capsule Take 2 capsules (120 mg total) by mouth daily. 60 capsule 0   hydrOXYzine (ATARAX/VISTARIL) 25 MG tablet Take 1 tablet (25 mg total) by mouth every 6 (six) hours as needed for anxiety. 30 tablet 0   meloxicam (MOBIC) 15 MG tablet Take 1 tablet (15 mg total) by mouth daily. 30 tablet 0   ondansetron (ZOFRAN-ODT) 4 MG disintegrating tablet Take 4 mg by mouth every 8 (eight) hours as needed.     QUEtiapine (SEROQUEL) 100 MG tablet Take 1 tablet (100 mg total) by mouth at bedtime. 30 tablet 0   SUMAtriptan  (IMITREX) 100 MG tablet Take 100 mg by mouth every 2 (two) hours as needed for migraine.     VRAYLAR 1.5 MG capsule Take 1 capsule by mouth daily.      Results for orders placed or performed during the hospital encounter of 01/26/22 (from the past 48 hour(s))  Glucose, capillary     Status: None   Collection Time: 01/29/22  6:39 AM  Result Value Ref Range   Glucose-Capillary 84 70 - 99 mg/dL    Comment: Glucose reference range applies only to samples taken after fasting for at least 8 hours.   No results found.  Review of Systems  Constitutional: Negative.   HENT: Negative.    Eyes: Negative.   Respiratory: Negative.    Cardiovascular: Negative.   Gastrointestinal: Negative.   Musculoskeletal: Negative.   Skin: Negative.   Neurological:  Positive for weakness.  Psychiatric/Behavioral: Negative.     Blood pressure 124/90, pulse 79, temperature (!) 97.5 F (36.4 C), temperature source Tympanic, resp. rate 18, height 5\' 1"  (1.549 m), weight 75.8 kg, SpO2 100 %. Physical Exam Vitals and nursing note reviewed.  Constitutional:      Appearance: She is well-developed.  HENT:     Head: Normocephalic and  atraumatic.  Eyes:     Conjunctiva/sclera: Conjunctivae normal.     Pupils: Pupils are equal, round, and reactive to light.  Cardiovascular:     Heart sounds: Normal heart sounds.  Pulmonary:     Effort: Pulmonary effort is normal.  Abdominal:     Palpations: Abdomen is soft.  Musculoskeletal:        General: Normal range of motion.     Cervical back: Normal range of motion.  Skin:    General: Skin is warm and dry.  Neurological:     Mental Status: She is alert. Mental status is at baseline.  Psychiatric:        Attention and Perception: Attention normal.        Mood and Affect: Mood normal.        Speech: Speech normal.        Behavior: Behavior normal.        Thought Content: Thought content normal.        Cognition and Memory: Cognition normal.      Assessment/Plan Ect index 2  Mordecai Rasmussen, MD 01/29/2022, 12:08 PM

## 2022-01-29 NOTE — Care Management Important Message (Signed)
Important Message  Patient Details  Name: Natasha [name] MRN: [id number] Date of Birth: 01-31-1971   Medicare Important Message Given:  N/A - LOS <3 / Initial given by admissions     Johnell Comings 01/29/2022, 8:19 AM

## 2022-01-29 NOTE — Progress Notes (Signed)
Recreation Therapy Notes   Date: 01/29/2022  Time: 10:40 am   Location: Craft room    Behavioral response: N/A   Intervention Topic: Communication   Discussion/Intervention: Patient refused to attend group.   Clinical Observations/Feedback:  Patient refused to attend group.    Annaly Skop LRT/CTRS        Alejandria Wessells 01/29/2022 11:02 AM

## 2022-01-29 NOTE — Anesthesia Postprocedure Evaluation (Signed)
Anesthesia Post Note  Patient: Natasha [name]  Procedure(s) Performed: ECT TX  Patient location during evaluation: PACU Anesthesia Type: General Level of consciousness: awake and alert Pain management: pain level controlled Vital Signs Assessment: post-procedure vital signs reviewed and stable Respiratory status: spontaneous breathing, nonlabored ventilation and respiratory function stable Cardiovascular status: blood pressure returned to baseline and stable Postop Assessment: no apparent nausea or vomiting Anesthetic complications: no   No notable events documented.   Last Vitals:  Vitals:   01/29/22 1445 01/29/22 1450  BP:  (!) 132/96  Pulse: 89 92  Resp: 19 (!) 21  Temp:    SpO2: (!) 89% 92%    Last Pain:  Vitals:   01/29/22 1450  TempSrc:   PainSc: 0-No pain                 Foye Deer

## 2022-01-30 NOTE — Progress Notes (Signed)
Patient endoses headache 4/10. PRN Tylenol was provided to patient. Patient rates headache 1/10 when reassessed. Patient rates anxiety and depression 5/10. Patient denies SI/HI/AVH. Patient cooperative during assessment. Patient compliant with medication administration. Q15 minute safety checks maintained. Patient remains safe on the unit at this time.

## 2022-01-30 NOTE — Plan of Care (Signed)
?  Problem: Education: ?Goal: Knowledge of [facility] Education information/materials will improve ?Outcome: Progressing ?Goal: Emotional status will improve ?Outcome: Progressing ?Goal: Mental status will improve ?Outcome: Progressing ?Goal: Verbalization of understanding the information provided will improve ?Outcome: Progressing ?  ?Problem: Health Behavior/Discharge Planning: ?Goal: Identification of resources available to assist in meeting health care needs will improve ?Outcome: Progressing ?Goal: Compliance with treatment plan for underlying cause of condition will improve ?Outcome: Progressing ?  ?Problem: Education: ?Goal: Utilization of techniques to improve thought processes will improve ?Outcome: Progressing ?Goal: Knowledge of the prescribed therapeutic regimen will improve ?Outcome: Progressing ?  ?Problem: Activity: ?Goal: Interest or engagement in leisure activities will improve ?Outcome: Progressing ?Goal: Imbalance in normal sleep/wake cycle will improve ?Outcome: Progressing ?  ?Problem: Coping: ?Goal: Coping ability will improve ?Outcome: Progressing ?Goal: Will verbalize feelings ?Outcome: Progressing ?  ?Problem: Role Relationship: ?Goal: Will demonstrate positive changes in social behaviors and relationships ?Outcome: Progressing ?  ?Problem: Safety: ?Goal: Ability to disclose and discuss suicidal ideas will improve ?Outcome: Progressing ?Goal: Ability to identify and utilize support systems that promote safety will improve ?Outcome: Progressing ?  ?Problem: Self-Concept: ?Goal: Will verbalize positive feelings about self ?Outcome: Progressing ?Goal: Level of anxiety will decrease ?Outcome: Progressing ?  ?

## 2022-01-30 NOTE — Group Note (Signed)
LCSW Group Therapy Note  Group Date: 01/30/2022 Start Time: [id number] End Time: 1415   Type of Therapy and Topic:  Group Therapy - Healthy vs Unhealthy Coping Skills  Participation Level:  Did Not Attend   Description of Group The focus of this group was to determine what unhealthy coping techniques typically are used by group members and what healthy coping techniques would be helpful in coping with various problems. Patients were guided in becoming aware of the differences between healthy and unhealthy coping techniques. Patients were asked to identify 2-3 healthy coping skills they would like to learn to use more effectively.  Therapeutic Goals Patients learned that coping is what human beings do all day long to deal with various situations in their lives Patients defined and discussed healthy vs unhealthy coping techniques Patients identified their preferred coping techniques and identified whether these were healthy or unhealthy Patients determined 2-3 healthy coping skills they would like to become more familiar with and use more often. Patients provided support and ideas to each other   Summary of Patient Progress:  Patient did not attend group despite encouraged participation.    Therapeutic Modalities Cognitive Behavioral Therapy Motivational Interviewing  Berniece Salines, Latanya Presser 01/30/2022  4:54 PM

## 2022-01-30 NOTE — Progress Notes (Signed)
Patient has been out in the milieu. Mood is pleasant. Affect is appropriate to circumstance. Denies SI, HI and AVH. Voices no complaints

## 2022-01-30 NOTE — Progress Notes (Signed)
[facility] MD Progress Note  01/30/2022 3:08 PM Natasha [name]  MRN:  [id number] Subjective: Follow-up 51 year old woman with severe depression currently in the hospital for ECT.  Patient had her second ECT treatment yesterday, 2/17.    Pt seen in her room. She reports better mood, no more SI.  She is hopeful that she will be much better after she completes her ECT treatment.    Principal Problem: Severe recurrent major depression without psychotic features (HCC) Diagnosis: Principal Problem:   Severe recurrent major depression without psychotic features (HCC) Active Problems:   Paraplegia (HCC)   History of DVT (deep vein thrombosis)   Muscle spasticity   Right foot drop  Total Time spent with patient: 20 minutes  Past Psychiatric History: Past history of recurrent severe depression  Past Medical History:  Past Medical History:  Diagnosis Date   Anxiety    C6 spinal cord injury (HCC)    Depression    OCD (obsessive compulsive disorder)    PTSD (post-traumatic stress disorder)     Past Surgical History:  Procedure Laterality Date   BACK SURGERY     NECK SURGERY     Family History:  Family History  Problem Relation Age of Onset   Alcohol abuse Father    Alcohol abuse Brother    Depression Mother    Alcohol abuse Mother    Parkinson's disease Mother    Family Psychiatric  History: See previous Social History:  Social History   Substance and Sexual Activity  Alcohol Use Yes   Alcohol/week: 0.0 standard drinks   Comment: Occasional use     Social History   Substance and Sexual Activity  Drug Use No    Social History   Socioeconomic History   Marital status: Single    Spouse name: Not on file   Number of children: Not on file   Years of education: Not on file   Highest education level: Not on file  Occupational History   Occupation: Disability  Tobacco Use   Smoking status: Former    Packs/day: 0.00    Types: Cigarettes   Smokeless tobacco: Never   Vaping Use   Vaping Use: Never used  Substance and Sexual Activity   Alcohol use: Yes    Alcohol/week: 0.0 standard drinks    Comment: Occasional use   Drug use: No   Sexual activity: Not Currently  Other Topics Concern   Not on file  Social History Narrative   Pt lives alone; receives outpatient psychiatry services through [facility].  Pt is on disability due to a spinal cord injury.    Social Determinants of Health   Financial Resource Strain: Not on file  Food Insecurity: Not on file  Transportation Needs: Not on file  Physical Activity: Not on file  Stress: Not on file  Social Connections: Not on file   Additional Social History:    Sleep: Fair  Appetite:  Fair  Current Medications: Current Facility-Administered Medications  Medication Dose Route Frequency Provider Last Rate Last Admin   acetaminophen (TYLENOL) tablet 650 mg  650 mg Oral Q6H PRN Clapacs, John T, MD   650 mg at 01/30/22 1121   alum & mag hydroxide-simeth (MAALOX/MYLANTA) 200-200-20 MG/5ML suspension 30 mL  30 mL Oral Q4H PRN Clapacs, John T, MD   30 mL at 01/30/22 0504   baclofen (LIORESAL) tablet 10 mg  10 mg Oral TID Clapacs, Jackquline Denmark, MD   10 mg at 01/30/22 1121   busPIRone (BUSPAR)  tablet 10 mg  10 mg Oral TID Clapacs, John T, MD   10 mg at 01/30/22 1121   clonazePAM (KLONOPIN) tablet 0.5 mg  0.5 mg Oral Daily PRN Clapacs, Jackquline Denmark, MD       DULoxetine (CYMBALTA) DR capsule 120 mg  120 mg Oral Daily Clapacs, John T, MD   120 mg at 01/30/22 0835   famotidine (PEPCID) tablet 20 mg  20 mg Oral Daily Clapacs, John T, MD   20 mg at 01/30/22 8099   hydrOXYzine (ATARAX) tablet 25 mg  25 mg Oral Q6H PRN Clapacs, Jackquline Denmark, MD       hydrOXYzine (ATARAX) tablet 50 mg  50 mg Oral Q6H PRN Clapacs, John T, MD       magnesium hydroxide (MILK OF MAGNESIA) suspension 30 mL  30 mL Oral Daily PRN Clapacs, Jackquline Denmark, MD       meloxicam (MOBIC) tablet 15 mg  15 mg Oral Daily Clapacs, John T, MD   15 mg at 01/30/22 0835    midazolam (VERSED) injection 2 mg  2 mg Intravenous Once Clapacs, Jackquline Denmark, MD       ondansetron [facility]) injection 4 mg  4 mg Intravenous Once PRN Corinda Gubler, MD       QUEtiapine (SEROQUEL) tablet 100 mg  100 mg Oral QHS Clapacs, John T, MD   100 mg at 01/29/22 2104   SUMAtriptan (IMITREX) tablet 100 mg  100 mg Oral Q2H PRN Clapacs, Jackquline Denmark, MD       traZODone (DESYREL) tablet 100 mg  100 mg Oral QHS PRN Clapacs, Jackquline Denmark, MD        Lab Results:  Results for orders placed or performed during the hospital encounter of 01/26/22 (from the past 48 hour(s))  Glucose, capillary     Status: None   Collection Time: 01/29/22  6:39 AM  Result Value Ref Range   Glucose-Capillary 84 70 - 99 mg/dL    Comment: Glucose reference range applies only to samples taken after fasting for at least 8 hours.    Blood Alcohol level:  Lab Results  Component Value Date   ETH 23 (H) 01/25/2022   ETH <10 10/16/2020    Metabolic Disorder Labs: Lab Results  Component Value Date   HGBA1C 5.1 01/27/2022   MPG 99.67 01/27/2022   MPG 85.32 10/16/2020   Lab Results  Component Value Date   PROLACTIN 121.9 (H) 02/03/2017   Lab Results  Component Value Date   CHOL 236 (H) 10/16/2020   TRIG 94 10/16/2020   HDL 78 10/16/2020   CHOLHDL 3.0 10/16/2020   VLDL 19 10/16/2020   LDLCALC 139 (H) 10/16/2020   LDLCALC 160 (H) 05/26/2020    Physical Findings: AIMS:  , ,  ,  ,    CIWA:    COWS:     Musculoskeletal: Strength & Muscle Tone:  Patient is quadriplegic with minimal movement in the lower extremities but retains some strength and movement in her arms and upper body. Gait & Station: unable to stand Patient leans: N/A  Psychiatric Specialty Exam:  Presentation  General Appearance: Appropriate for Environment  Eye Contact:Good  Speech:Clear and Coherent  Speech Volume:Normal  Handedness:Right   Mood and Affect  Mood:Depressed  Affect:Blunt; Congruent   Thought Process  Thought  Processes:Coherent  Descriptions of Associations:Intact  Orientation:Full (Time, Place and Person)  Thought Content:Logical  History of Schizophrenia/Schizoaffective disorder:No  Duration of Psychotic Symptoms:No data recorded Hallucinations:No data recorded Ideas of Reference:None  Suicidal Thoughts:No data recorded Homicidal Thoughts:No data recorded  Sensorium  Memory:Immediate Good; Recent Good; Remote Good  Judgment:Fair  Insight:Poor   Executive Functions  Concentration:Good  Attention Span:Good  Recall:Good  Fund of Knowledge:Good  Language:Good   Psychomotor Activity  Psychomotor Activity:No data recorded  Assets  Assets:Communication Skills; Physical Health; Resilience; Social Support; Vocational/Educational   Sleep  Sleep:No data recorded   Physical Exam: Physical Exam Vitals and nursing note reviewed.  Constitutional:      Appearance: Normal appearance.  HENT:     Head: Normocephalic and atraumatic.     Mouth/Throat:     Pharynx: Oropharynx is clear.  Eyes:     Pupils: Pupils are equal, round, and reactive to light.  Cardiovascular:     Rate and Rhythm: Normal rate and regular rhythm.  Pulmonary:     Effort: Pulmonary effort is normal.     Breath sounds: Normal breath sounds.  Abdominal:     General: Abdomen is flat.     Palpations: Abdomen is soft.  Musculoskeletal:        General: Normal range of motion.  Skin:    General: Skin is warm and dry.  Neurological:     Mental Status: She is alert. Mental status is at baseline.     Motor: Weakness present.  Psychiatric:        Mood and Affect: Mood normal.        Thought Content: Thought content normal.   Review of Systems  Constitutional: Negative.   HENT: Negative.    Eyes: Negative.   Respiratory: Negative.    Cardiovascular: Negative.   Gastrointestinal: Negative.   Musculoskeletal: Negative.   Skin: Negative.   Neurological:  Positive for weakness.   Psychiatric/Behavioral:  Positive for depression. Negative for hallucinations and suicidal ideas.   Blood pressure 126/90, pulse 76, temperature (!) 97.4 F (36.3 C), temperature source Oral, resp. rate 17, height 5\' 1"  (1.549 m), weight 75.8 kg, SpO2 100 %. Body mass index is 31.55 kg/m.   Treatment Plan Summary: Plan no change to current medication.  Next ECT will be Monday.  Based on previous response there is a good chance that she will be ready for discharge at that time.  Supportive therapy and review of plan with patient  MDD, severe, recurrent -- continue ECT -- continue her current meds: buspar, cymbalta seroquel and klonopin PRN.    Keinan Brouillet, MD 01/30/2022, 3:08 PM

## 2022-01-31 ENCOUNTER — Other Ambulatory Visit: Payer: Self-pay | Admitting: Psychiatry

## 2022-01-31 NOTE — Progress Notes (Signed)
Patient denies pain. Patient rates anxiety and depression 4/10. Patient states that 4/10 is "managable" during assessment. Patient denies SI/HI/AVH. Patient cooperative during assessment. Patient compliant with medication administration. Patient has been seen throughout shift reading a book in their room and in the dayroom. Q15 minute safety checks maintained. Patient remains safe on the unit at this time.

## 2022-01-31 NOTE — Progress Notes (Signed)
Patient has been pleasant and cooperative. Spending time in the day room reading. Denies SI, HI and AVH. No complaints

## 2022-01-31 NOTE — Plan of Care (Signed)
?  Problem: Education: ?Goal: Knowledge of [place] [facility] Education information/materials will improve ?Outcome: Progressing ?Goal: Emotional status will improve ?Outcome: Progressing ?Goal: Mental status will improve ?Outcome: Progressing ?Goal: Verbalization of understanding the information provided will improve ?Outcome: Progressing ?  ?Problem: Health Behavior/Discharge Planning: ?Goal: Identification of resources available to assist in meeting health care needs will improve ?Outcome: Progressing ?Goal: Compliance with treatment plan for underlying cause of condition will improve ?Outcome: Progressing ?  ?Problem: Education: ?Goal: Utilization of techniques to improve thought processes will improve ?Outcome: Progressing ?Goal: Knowledge of the prescribed therapeutic regimen will improve ?Outcome: Progressing ?  ?Problem: Activity: ?Goal: Interest or engagement in leisure activities will improve ?Outcome: Progressing ?Goal: Imbalance in normal sleep/wake cycle will improve ?Outcome: Progressing ?  ?Problem: Coping: ?Goal: Coping ability will improve ?Outcome: Progressing ?Goal: Will verbalize feelings ?Outcome: Progressing ?  ?Problem: Role Relationship: ?Goal: Will demonstrate positive changes in social behaviors and relationships ?Outcome: Progressing ?  ?Problem: Safety: ?Goal: Ability to disclose and discuss suicidal ideas will improve ?Outcome: Progressing ?Goal: Ability to identify and utilize support systems that promote safety will improve ?Outcome: Progressing ?  ?Problem: Self-Concept: ?Goal: Will verbalize positive feelings about self ?Outcome: Progressing ?Goal: Level of anxiety will decrease ?Outcome: Progressing ?  ?

## 2022-01-31 NOTE — Progress Notes (Signed)
[facility] MD Progress Note  01/31/2022 11:36 AM Natasha [name]  MRN:  [id number] Subjective: Follow-up 51 year old woman with severe depression currently in the hospital for ECT.  Patient had her second ECT treatment on 2/17.    Pt seen in her room. She continue to report good mood, feeling hopeful. No SI or HI. No AVH.    Principal Problem: Severe recurrent major depression without psychotic features (HCC) Diagnosis: Principal Problem:   Severe recurrent major depression without psychotic features (HCC) Active Problems:   Paraplegia (HCC)   History of DVT (deep vein thrombosis)   Muscle spasticity   Right foot drop  Total Time spent with patient: 20 minutes  Past Psychiatric History: Past history of recurrent severe depression  Past Medical History:  Past Medical History:  Diagnosis Date   Anxiety    C6 spinal cord injury (HCC)    Depression    OCD (obsessive compulsive disorder)    PTSD (post-traumatic stress disorder)     Past Surgical History:  Procedure Laterality Date   BACK SURGERY     NECK SURGERY     Family History:  Family History  Problem Relation Age of Onset   Alcohol abuse Father    Alcohol abuse Brother    Depression Mother    Alcohol abuse Mother    Parkinson's disease Mother    Family Psychiatric  History: See previous Social History:  Social History   Substance and Sexual Activity  Alcohol Use Yes   Alcohol/week: 0.0 standard drinks   Comment: Occasional use     Social History   Substance and Sexual Activity  Drug Use No    Social History   Socioeconomic History   Marital status: Single    Spouse name: Not on file   Number of children: Not on file   Years of education: Not on file   Highest education level: Not on file  Occupational History   Occupation: Disability  Tobacco Use   Smoking status: Former    Packs/day: 0.00    Types: Cigarettes   Smokeless tobacco: Never  Vaping Use   Vaping Use: Never used  Substance and  Sexual Activity   Alcohol use: Yes    Alcohol/week: 0.0 standard drinks    Comment: Occasional use   Drug use: No   Sexual activity: Not Currently  Other Topics Concern   Not on file  Social History Narrative   Pt lives alone; receives outpatient psychiatry services through [facility].  Pt is on disability due to a spinal cord injury.    Social Determinants of Health   Financial Resource Strain: Not on file  Food Insecurity: Not on file  Transportation Needs: Not on file  Physical Activity: Not on file  Stress: Not on file  Social Connections: Not on file   Additional Social History:    Sleep: Fair  Appetite:  Fair  Current Medications: Current Facility-Administered Medications  Medication Dose Route Frequency Provider Last Rate Last Admin   acetaminophen (TYLENOL) tablet 650 mg  650 mg Oral Q6H PRN Clapacs, John T, MD   650 mg at 01/30/22 1121   alum & mag hydroxide-simeth (MAALOX/MYLANTA) 200-200-20 MG/5ML suspension 30 mL  30 mL Oral Q4H PRN Clapacs, John T, MD   30 mL at 01/30/22 0504   baclofen (LIORESAL) tablet 10 mg  10 mg Oral TID Clapacs, John T, MD   10 mg at 01/31/22 1124   busPIRone (BUSPAR) tablet 10 mg  10 mg Oral TID Clapacs,  Jackquline Denmark, MD   10 mg at 01/31/22 1124   clonazePAM (KLONOPIN) tablet 0.5 mg  0.5 mg Oral Daily PRN Clapacs, Jackquline Denmark, MD       DULoxetine (CYMBALTA) DR capsule 120 mg  120 mg Oral Daily Clapacs, John T, MD   120 mg at 01/31/22 0827   famotidine (PEPCID) tablet 20 mg  20 mg Oral Daily Clapacs, John T, MD   20 mg at 01/31/22 0827   hydrOXYzine (ATARAX) tablet 25 mg  25 mg Oral Q6H PRN Clapacs, Jackquline Denmark, MD       hydrOXYzine (ATARAX) tablet 50 mg  50 mg Oral Q6H PRN Clapacs, John T, MD       magnesium hydroxide (MILK OF MAGNESIA) suspension 30 mL  30 mL Oral Daily PRN Clapacs, John T, MD       meloxicam (MOBIC) tablet 15 mg  15 mg Oral Daily Clapacs, John T, MD   15 mg at 01/31/22 0827   midazolam (VERSED) injection 2 mg  2 mg Intravenous  Once Clapacs, Jackquline Denmark, MD       ondansetron [facility]) injection 4 mg  4 mg Intravenous Once PRN Corinda Gubler, MD       QUEtiapine (SEROQUEL) tablet 100 mg  100 mg Oral QHS Clapacs, John T, MD   100 mg at 01/30/22 2103   SUMAtriptan (IMITREX) tablet 100 mg  100 mg Oral Q2H PRN Clapacs, Jackquline Denmark, MD       traZODone (DESYREL) tablet 100 mg  100 mg Oral QHS PRN Clapacs, Jackquline Denmark, MD        Lab Results:  No results found for this or any previous visit (from the past 48 hour(s)).   Blood Alcohol level:  Lab Results  Component Value Date   ETH 23 (H) 01/25/2022   ETH <10 10/16/2020    Metabolic Disorder Labs: Lab Results  Component Value Date   HGBA1C 5.1 01/27/2022   MPG 99.67 01/27/2022   MPG 85.32 10/16/2020   Lab Results  Component Value Date   PROLACTIN 121.9 (H) 02/03/2017   Lab Results  Component Value Date   CHOL 236 (H) 10/16/2020   TRIG 94 10/16/2020   HDL 78 10/16/2020   CHOLHDL 3.0 10/16/2020   VLDL 19 10/16/2020   LDLCALC 139 (H) 10/16/2020   LDLCALC 160 (H) 05/26/2020    Physical Findings: AIMS:  , ,  ,  ,    CIWA:    COWS:     Musculoskeletal: Strength & Muscle Tone:  Patient is quadriplegic with minimal movement in the lower extremities but retains some strength and movement in her arms and upper body. Gait & Station: unable to stand Patient leans: N/A  Psychiatric Specialty Exam:  Presentation  General Appearance: Appropriate for Environment  Eye Contact:Good  Speech:Clear and Coherent  Speech Volume:Normal  Handedness:Right   Mood and Affect  Mood:Depressed  Affect:Blunt; Congruent   Thought Process  Thought Processes:Coherent  Descriptions of Associations:Intact  Orientation:Full (Time, Place and Person)  Thought Content:Logical  History of Schizophrenia/Schizoaffective disorder:No  Duration of Psychotic Symptoms:No data recorded Hallucinations:No data recorded Ideas of Reference:None  Suicidal Thoughts:No data  recorded Homicidal Thoughts:No data recorded  Sensorium  Memory:Immediate Good; Recent Good; Remote Good  Judgment:Fair  Insight:Poor   Executive Functions  Concentration:Good  Attention Span:Good  Recall:Good  Fund of Knowledge:Good  Language:Good   Psychomotor Activity  Psychomotor Activity:No data recorded  Assets  Assets:Communication Skills; Physical Health; Resilience; Social Support; Vocational/Educational   Sleep  Sleep:No data recorded   Physical Exam: Physical Exam Vitals and nursing note reviewed.  Constitutional:      Appearance: Normal appearance.  HENT:     Head: Normocephalic and atraumatic.     Mouth/Throat:     Pharynx: Oropharynx is clear.  Eyes:     Pupils: Pupils are equal, round, and reactive to light.  Cardiovascular:     Rate and Rhythm: Normal rate and regular rhythm.  Pulmonary:     Effort: Pulmonary effort is normal.     Breath sounds: Normal breath sounds.  Abdominal:     General: Abdomen is flat.     Palpations: Abdomen is soft.  Musculoskeletal:        General: Normal range of motion.  Skin:    General: Skin is warm and dry.  Neurological:     Mental Status: She is alert. Mental status is at baseline.     Motor: Weakness present.  Psychiatric:        Mood and Affect: Mood normal.        Thought Content: Thought content normal.   Review of Systems  Constitutional: Negative.   HENT: Negative.    Eyes: Negative.   Respiratory: Negative.    Cardiovascular: Negative.   Gastrointestinal: Negative.   Musculoskeletal: Negative.   Skin: Negative.   Neurological:  Positive for weakness.  Psychiatric/Behavioral:  Positive for depression. Negative for hallucinations and suicidal ideas.   Blood pressure 94/71, pulse 99, temperature 97.8 F (36.6 C), resp. rate 17, height 5\' 1"  (1.549 m), weight 75.8 kg, SpO2 97 %. Body mass index is 31.55 kg/m.   Treatment Plan Summary: Plan no change to current medication.  Next ECT  will be Monday.  Based on previous response there is a good chance that she will be ready for discharge at that time.  Supportive therapy and review of plan with patient  MDD, severe, recurrent -- continue ECT -- continue her current meds: buspar, cymbalta seroquel and klonopin PRN.    Dearl Rudden, MD 01/31/2022, 11:36 AM

## 2022-01-31 NOTE — Plan of Care (Signed)
°  Problem: Education: Goal: Knowledge of [place] [facility] Education information/materials will improve Outcome: Progressing Goal: Emotional status will improve Outcome: Progressing Goal: Mental status will improve Outcome: Progressing Goal: Verbalization of understanding the information provided will improve Outcome: Progressing   Problem: Health Behavior/Discharge Planning: Goal: Compliance with treatment plan for underlying cause of condition will improve Outcome: Progressing   Problem: Education: Goal: Knowledge of the prescribed therapeutic regimen will improve Outcome: Progressing   Problem: Activity: Goal: Imbalance in normal sleep/wake cycle will improve Outcome: Progressing   Problem: Coping: Goal: Coping ability will improve Outcome: Progressing Goal: Will verbalize feelings Outcome: Progressing   Problem: Safety: Goal: Ability to identify and utilize support systems that promote safety will improve Outcome: Progressing

## 2022-02-01 ENCOUNTER — Inpatient Hospital Stay: Payer: 59 | Admitting: Certified Registered Nurse Anesthetist

## 2022-02-01 ENCOUNTER — Ambulatory Visit: Payer: 59 | Attending: Psychiatry

## 2022-02-01 ENCOUNTER — Encounter: Payer: Self-pay | Admitting: Psychiatry

## 2022-02-01 DIAGNOSIS — Z86718 Personal history of other venous thrombosis and embolism: Secondary | ICD-10-CM | POA: Insufficient documentation

## 2022-02-01 DIAGNOSIS — G8220 Paraplegia, unspecified: Secondary | ICD-10-CM | POA: Insufficient documentation

## 2022-02-01 DIAGNOSIS — R252 Cramp and spasm: Secondary | ICD-10-CM | POA: Insufficient documentation

## 2022-02-01 DIAGNOSIS — F332 Major depressive disorder, recurrent severe without psychotic features: Secondary | ICD-10-CM | POA: Insufficient documentation

## 2022-02-01 DIAGNOSIS — M21371 Foot drop, right foot: Secondary | ICD-10-CM | POA: Insufficient documentation

## 2022-02-01 LAB — GLUCOSE, CAPILLARY: Glucose-Capillary: 81 mg/dL (ref 70–99)

## 2022-02-01 MED ORDER — METHOHEXITAL SODIUM 0.5 G IJ SOLR
INTRAMUSCULAR | Status: AC
  Filled 2022-02-01: qty 500

## 2022-02-01 MED ORDER — MIDAZOLAM HCL 2 MG/2ML IJ SOLN
2.0000 mg | Freq: Once | INTRAMUSCULAR | Status: AC
  Administered 2022-02-01: 2 mg via INTRAVENOUS

## 2022-02-01 MED ORDER — MIDAZOLAM HCL 2 MG/2ML IJ SOLN
INTRAMUSCULAR | Status: AC
  Filled 2022-02-01: qty 2

## 2022-02-01 MED ORDER — METHOHEXITAL SODIUM 100 MG/10ML IV SOSY
PREFILLED_SYRINGE | INTRAVENOUS | Status: DC | PRN
  Administered 2022-02-01: 60 mg via INTRAVENOUS

## 2022-02-01 MED ORDER — SODIUM CHLORIDE 0.9 % IV SOLN: 500.0000 mL | Freq: Once | INTRAVENOUS | Status: AC

## 2022-02-01 MED ORDER — SUCCINYLCHOLINE CHLORIDE 200 MG/10ML IV SOSY
PREFILLED_SYRINGE | INTRAVENOUS | Status: DC | PRN
  Administered 2022-02-01: 80 mg via INTRAVENOUS

## 2022-02-01 NOTE — Progress Notes (Signed)
Patient has been calm, cooperative and pleasant. Denies SI, HI and AVH. Advised to remain NPO after midnight for ECT in the am

## 2022-02-01 NOTE — Progress Notes (Signed)
[facility] MD Progress Note  02/01/2022 3:31 PM Natasha [name]  MRN:  [id number] Subjective: Follow-up 51 year old woman with severe depression.  Patient reports improvement in her mood.  Decreased suicidal ideation.  Less anxiety less agitation.  Tolerating medication well.  Tolerating ECT well. Principal Problem: Severe recurrent major depression without psychotic features (HCC) Diagnosis: Principal Problem:   Severe recurrent major depression without psychotic features (HCC) Active Problems:   Paraplegia (HCC)   History of DVT (deep vein thrombosis)   Muscle spasticity   Right foot drop  Total Time spent with patient: 30 minutes  Past Psychiatric History: Past history of severe chronic and recurrent depression with multiple suicide attempts  Past Medical History:  Past Medical History:  Diagnosis Date   Anxiety    C6 spinal cord injury (HCC)    Depression    OCD (obsessive compulsive disorder)    PTSD (post-traumatic stress disorder)     Past Surgical History:  Procedure Laterality Date   BACK SURGERY     NECK SURGERY     Family History:  Family History  Problem Relation Age of Onset   Alcohol abuse Father    Alcohol abuse Brother    Depression Mother    Alcohol abuse Mother    Parkinson's disease Mother    Family Psychiatric  History: See previous Social History:  Social History   Substance and Sexual Activity  Alcohol Use Yes   Alcohol/week: 0.0 standard drinks   Comment: Occasional use     Social History   Substance and Sexual Activity  Drug Use No    Social History   Socioeconomic History   Marital status: Single    Spouse name: Not on file   Number of children: Not on file   Years of education: Not on file   Highest education level: Not on file  Occupational History   Occupation: Disability  Tobacco Use   Smoking status: Former    Packs/day: 0.00    Types: Cigarettes   Smokeless tobacco: Never  Vaping Use   Vaping Use: Never used   Substance and Sexual Activity   Alcohol use: Yes    Alcohol/week: 0.0 standard drinks    Comment: Occasional use   Drug use: No   Sexual activity: Not Currently  Other Topics Concern   Not on file  Social History Narrative   Pt lives alone; receives outpatient psychiatry services through [facility].  Pt is on disability due to a spinal cord injury.    Social Determinants of Health   Financial Resource Strain: Not on file  Food Insecurity: Not on file  Transportation Needs: Not on file  Physical Activity: Not on file  Stress: Not on file  Social Connections: Not on file   Additional Social History:                         Sleep: Fair  Appetite:  Fair  Current Medications: Current Facility-Administered Medications  Medication Dose Route Frequency Provider Last Rate Last Admin   acetaminophen (TYLENOL) tablet 650 mg  650 mg Oral Q6H PRN Abbee Cremeens T, MD   650 mg at 01/30/22 1121   alum & mag hydroxide-simeth (MAALOX/MYLANTA) 200-200-20 MG/5ML suspension 30 mL  30 mL Oral Q4H PRN Daiana Vitiello T, MD   30 mL at 01/30/22 0504   baclofen (LIORESAL) tablet 10 mg  10 mg Oral TID Islam Eichinger, Jackquline Denmark, MD   10 mg at 02/01/22 1206  busPIRone (BUSPAR) tablet 10 mg  10 mg Oral TID Dani Wallner, Jackquline Denmark, MD   10 mg at 02/01/22 1206   clonazePAM (KLONOPIN) tablet 0.5 mg  0.5 mg Oral Daily PRN Alonna Bartling, Jackquline Denmark, MD       DULoxetine (CYMBALTA) DR capsule 120 mg  120 mg Oral Daily Dominyk Law, Jackquline Denmark, MD   120 mg at 02/01/22 6269   famotidine (PEPCID) tablet 20 mg  20 mg Oral Daily Yanelle Sousa, Jackquline Denmark, MD   20 mg at 02/01/22 4854   hydrOXYzine (ATARAX) tablet 25 mg  25 mg Oral Q6H PRN Mirza Kidney, Jackquline Denmark, MD       hydrOXYzine (ATARAX) tablet 50 mg  50 mg Oral Q6H PRN Dreyah Montrose, Jackquline Denmark, MD       magnesium hydroxide (MILK OF MAGNESIA) suspension 30 mL  30 mL Oral Daily PRN Iver Fehrenbach, Jackquline Denmark, MD       meloxicam (MOBIC) tablet 15 mg  15 mg Oral Daily Marypat Kimmet, Jackquline Denmark, MD   15 mg at 02/01/22 6270    midazolam (VERSED) injection 2 mg  2 mg Intravenous Once Nao Linz, Jackquline Denmark, MD       ondansetron [facility]) injection 4 mg  4 mg Intravenous Once PRN Corinda Gubler, MD       QUEtiapine (SEROQUEL) tablet 100 mg  100 mg Oral QHS Rashada Klontz T, MD   100 mg at 01/31/22 2103   SUMAtriptan (IMITREX) tablet 100 mg  100 mg Oral Q2H PRN Camron Monday, Jackquline Denmark, MD       traZODone (DESYREL) tablet 100 mg  100 mg Oral QHS PRN Shakerria Parran, Jackquline Denmark, MD        Lab Results:  Results for orders placed or performed during the hospital encounter of 01/26/22 (from the past 48 hour(s))  Glucose, capillary     Status: None   Collection Time: 02/01/22  6:00 AM  Result Value Ref Range   Glucose-Capillary 81 70 - 99 mg/dL    Comment: Glucose reference range applies only to samples taken after fasting for at least 8 hours.    Blood Alcohol level:  Lab Results  Component Value Date   ETH 23 (H) 01/25/2022   ETH <10 10/16/2020    Metabolic Disorder Labs: Lab Results  Component Value Date   HGBA1C 5.1 01/27/2022   MPG 99.67 01/27/2022   MPG 85.32 10/16/2020   Lab Results  Component Value Date   PROLACTIN 121.9 (H) 02/03/2017   Lab Results  Component Value Date   CHOL 236 (H) 10/16/2020   TRIG 94 10/16/2020   HDL 78 10/16/2020   CHOLHDL 3.0 10/16/2020   VLDL 19 10/16/2020   LDLCALC 139 (H) 10/16/2020   LDLCALC 160 (H) 05/26/2020    Physical Findings: AIMS:  , ,  ,  ,    CIWA:    COWS:     Musculoskeletal: Strength & Muscle Tone: within normal limits Gait & Station: normal Patient leans: N/A  Psychiatric Specialty Exam:  Presentation  General Appearance: Appropriate for Environment  Eye Contact:Good  Speech:Clear and Coherent  Speech Volume:Normal  Handedness:Right   Mood and Affect  Mood:Depressed  Affect:Blunt; Congruent   Thought Process  Thought Processes:Coherent  Descriptions of Associations:Intact  Orientation:Full (Time, Place and Person)  Thought Content:Logical  History  of Schizophrenia/Schizoaffective disorder:No  Duration of Psychotic Symptoms:No data recorded Hallucinations:No data recorded Ideas of Reference:None  Suicidal Thoughts:No data recorded Homicidal Thoughts:No data recorded  Sensorium  Memory:Immediate Good; Recent Good; Remote Good  Judgment:Fair  Insight:Poor   Executive Functions  Concentration:Good  Attention Span:Good  Recall:Good  Fund of Knowledge:Good  Language:Good   Psychomotor Activity  Psychomotor Activity:No data recorded  Assets  Assets:Communication Skills; Physical Health; Resilience; Social Support; Vocational/Educational   Sleep  Sleep:No data recorded   Physical Exam: Physical Exam Vitals and nursing note reviewed.  Constitutional:      Appearance: Normal appearance.  HENT:     Head: Normocephalic and atraumatic.     Mouth/Throat:     Pharynx: Oropharynx is clear.  Eyes:     Pupils: Pupils are equal, round, and reactive to light.  Cardiovascular:     Rate and Rhythm: Normal rate and regular rhythm.  Pulmonary:     Effort: Pulmonary effort is normal.     Breath sounds: Normal breath sounds.  Abdominal:     General: Abdomen is flat.     Palpations: Abdomen is soft.  Musculoskeletal:        General: Normal range of motion.  Skin:    General: Skin is warm and dry.  Neurological:     Mental Status: She is alert. Mental status is at baseline.  Psychiatric:        Mood and Affect: Mood normal.        Thought Content: Thought content normal.   Review of Systems  Constitutional: Negative.   HENT: Negative.    Eyes: Negative.   Respiratory: Negative.    Cardiovascular: Negative.   Gastrointestinal: Negative.   Musculoskeletal: Negative.   Skin: Negative.   Neurological:  Positive for focal weakness and weakness.  Psychiatric/Behavioral:  Negative for depression, hallucinations and suicidal ideas.   Blood pressure (!) 135/93, pulse (!) 107, temperature 97.6 F (36.4 C), resp. rate  18, height 5\' 1"  (1.549 m), weight 75.8 kg, SpO2 91 %. Body mass index is 31.55 kg/m.   Treatment Plan Summary: Medication management and Plan patient had 30 ECT treatment today.  Has reported significant improvement with these treatment.  We agree that we will reassess how she is doing tomorrow and then consider whether she needs further treatment this week.  No change to medicine  , MD 02/01/2022, 3:31 PM

## 2022-02-01 NOTE — Progress Notes (Signed)
Patient compliant with medications denies SI/HI/A/VH and verbally contracted for safety. Patient ambulating with wheel chair and all fall protocol in place. Patient remains safe.  Support and encouragement provided.

## 2022-02-01 NOTE — Anesthesia Preprocedure Evaluation (Signed)
Anesthesia Evaluation  Patient identified by MRN, date of birth, ID band Patient awake    Reviewed: Allergy & Precautions, NPO status , Patient's Chart, lab work & pertinent test results  History of Anesthesia Complications Negative for: history of anesthetic complications  Airway Mallampati: II  TM Distance: >3 FB Neck ROM: Full    Dental  (+) Poor Dentition, Dental Advidsory Given   Pulmonary neg pulmonary ROS, neg shortness of breath, neg sleep apnea, neg COPD, neg recent URI, Patient abstained from smoking.Not current smoker, former smoker,    breath sounds clear to auscultation- rhonchi (-) wheezing      Cardiovascular Exercise Tolerance: Good METS(-) hypertension(-) angina(-) CAD, (-) Past MI, (-) Cardiac Stents and (-) CABG negative cardio ROS  (-) dysrhythmias  Rhythm:Regular Rate:Normal - Systolic murmurs and - Diastolic murmurs    Neuro/Psych neg Seizures PSYCHIATRIC DISORDERS Anxiety Depression negative neurological ROS     GI/Hepatic negative GI ROS, Neg liver ROS, neg GERD  ,(+)     (-) substance abuse  ,   Endo/Other  negative endocrine ROSneg diabetes  Renal/GU negative Renal ROS     Musculoskeletal negative musculoskeletal ROS (+)   Abdominal (+) - obese,   Peds  Hematology negative hematology ROS (+)   Anesthesia Other Findings Past Medical History: No date: Anxiety No date: C6 spinal cord injury (HCC) No date: Depression No date: OCD (obsessive compulsive disorder) No date: PTSD (post-traumatic stress disorder)  Reproductive/Obstetrics                             Anesthesia Physical  Anesthesia Plan  ASA: 2  Anesthesia Plan: General   Post-op Pain Management:    Induction: Intravenous  PONV Risk Score and Plan: 3 and Ondansetron and TIVA  Airway Management Planned: Mask  Additional Equipment: None  Intra-op Plan:   Post-operative Plan:   Informed  Consent: I have reviewed the patients History and Physical, chart, labs and discussed the procedure including the risks, benefits and alternatives for the proposed anesthesia with the patient or authorized representative who has indicated his/her understanding and acceptance.     Dental advisory given  Plan Discussed with: CRNA and Anesthesiologist  Anesthesia Plan Comments: (Discussed risks of anesthesia with patient, including PONV, muscle aches. Rare risks discussed as well, such as cardiorespiratory sequelae, need for airway intervention and its associated risks including lip/dental/eye damage and sore throat, and allergic reactions. Discussed the role of CRNA in patient's perioperative care. Patient understands.)        Anesthesia Quick Evaluation

## 2022-02-01 NOTE — Plan of Care (Signed)
Patient tolerated ECT well. Stated that it is helping with her mood. Denies SI,HI and AVH. Appetite ane energy level good. ADLs maintained. Support and encouragement given.

## 2022-02-01 NOTE — H&P (Signed)
Natasha [name] is an 51 y.o. female.   Chief Complaint: Patient reports feeling that mood is significantly improved.  Suicidal thought decreased HPI: Mood improved.  Tolerating treatment well  Past Medical History:  Diagnosis Date   Anxiety    C6 spinal cord injury (HCC)    Depression    OCD (obsessive compulsive disorder)    PTSD (post-traumatic stress disorder)     Past Surgical History:  Procedure Laterality Date   BACK SURGERY     NECK SURGERY      Family History  Problem Relation Age of Onset   Alcohol abuse Father    Alcohol abuse Brother    Depression Mother    Alcohol abuse Mother    Parkinson's disease Mother    Social History:  reports that she has quit smoking. Her smoking use included cigarettes. She has never used smokeless tobacco. She reports current alcohol use. She reports that she does not use drugs.  Allergies: No Known Allergies  Medications Prior to Admission  Medication Sig Dispense Refill   baclofen (LIORESAL) 10 MG tablet Take 1 tablet (10 mg total) by mouth 3 (three) times daily. 90 each 0   buPROPion (WELLBUTRIN XL) 150 MG 24 hr tablet Take 150 mg by mouth in the morning and at bedtime.     busPIRone (BUSPAR) 10 MG tablet Take 1 tablet (10 mg total) by mouth 3 (three) times daily. 90 tablet 0   clonazePAM (KLONOPIN) 0.5 MG tablet Take 1 tablet (0.5 mg total) by mouth daily as needed for anxiety. 30 tablet 0   diazepam (VALIUM) 5 MG tablet Take 5 mg by mouth 2 (two) times daily as needed.     DULoxetine (CYMBALTA) 60 MG capsule Take 2 capsules (120 mg total) by mouth daily. 60 capsule 0   hydrOXYzine (ATARAX/VISTARIL) 25 MG tablet Take 1 tablet (25 mg total) by mouth every 6 (six) hours as needed for anxiety. 30 tablet 0   meloxicam (MOBIC) 15 MG tablet Take 1 tablet (15 mg total) by mouth daily. 30 tablet 0   ondansetron (ZOFRAN-ODT) 4 MG disintegrating tablet Take 4 mg by mouth every 8 (eight) hours as needed.     QUEtiapine (SEROQUEL) 100  MG tablet Take 1 tablet (100 mg total) by mouth at bedtime. 30 tablet 0   SUMAtriptan (IMITREX) 100 MG tablet Take 100 mg by mouth every 2 (two) hours as needed for migraine.     VRAYLAR 1.5 MG capsule Take 1 capsule by mouth daily.      Results for orders placed or performed during the hospital encounter of 01/26/22 (from the past 48 hour(s))  Glucose, capillary     Status: None   Collection Time: 02/01/22  6:00 AM  Result Value Ref Range   Glucose-Capillary 81 70 - 99 mg/dL    Comment: Glucose reference range applies only to samples taken after fasting for at least 8 hours.   No results found.  Review of Systems  Constitutional: Negative.   HENT: Negative.    Eyes: Negative.   Respiratory: Negative.    Cardiovascular: Negative.   Gastrointestinal: Negative.   Musculoskeletal: Negative.   Skin: Negative.   Neurological:  Positive for weakness.  Psychiatric/Behavioral: Negative.     Blood pressure 119/81, pulse 84, temperature 98.3 F (36.8 C), resp. rate 14, height 5\' 1"  (1.549 m), weight 75.8 kg, SpO2 90 %. Physical Exam Vitals reviewed.  Constitutional:      Appearance: She is well-developed.  HENT:  Head: Normocephalic and atraumatic.  Eyes:     Conjunctiva/sclera: Conjunctivae normal.     Pupils: Pupils are equal, round, and reactive to light.  Cardiovascular:     Heart sounds: Normal heart sounds.  Pulmonary:     Effort: Pulmonary effort is normal.  Abdominal:     Palpations: Abdomen is soft.  Musculoskeletal:        General: Normal range of motion.     Cervical back: Normal range of motion.  Skin:    General: Skin is warm and dry.  Neurological:     Mental Status: She is alert. Mental status is at baseline.  Psychiatric:        Mood and Affect: Mood normal.        Thought Content: Thought content normal.     Assessment/Plan Treatment today will be #3 after which we will reassess the need for further treatment  Mordecai Rasmussen, MD 02/01/2022, 5:02  PM

## 2022-02-01 NOTE — Procedures (Signed)
[facility] Physicians Interval Evaluation & Treatment Note  Patient Identification: Deshauna Cayson MRN:  [id number] Date of Evaluation:  02/01/2022 TX #: 3  MADRS:   MMSE:   P.E. Findings:  Chronic quadriplegia of course but no other change to physical exam  Psychiatric Interval Note:  Mood reported as improved  Subjective:  Patient is a 51 y.o. female seen for evaluation for Electroconvulsive Therapy. No specific complaint  Treatment Summary:   [x]   Right Unilateral             []  Bilateral   % Energy : 0.3 ms 55%   Impedance: 1350 ohms  Seizure Energy Index: No reading  Postictal Suppression Index: No reading  Seizure Concordance Index: No reading  Medications  Pre Shock: Brevital 60 mg succinylcholine 80 mg  Post Shock: Versed 2 mg  Seizure Duration: EMG 27 seconds EEG 46 seconds   Comments: Well tolerated.  Very good seizure I do not know why the computer did not pick it up.  Reassess mood tomorrow  Lungs:  [x]   Clear to auscultation               []  Other:   Heart:    [x]   Regular rhythm             []  irregular rhythm    [x]   Previous H&P reviewed, patient examined and there are NO CHANGES                 []   Previous H&P reviewed, patient examined and there are changes noted.   , MD 2/20/20235:04 PM

## 2022-02-01 NOTE — Transfer of Care (Signed)
Immediate Anesthesia Transfer of Care Note  Patient: Natasha [name]  Procedure(s) Performed: ECT TX  Patient Location: PACU  Anesthesia Type:General  Level of Consciousness: awake  Airway & Oxygen Therapy: Patient Spontanous Breathing and Patient connected to face mask oxygen  Post-op Assessment: Report given to RN and Post -op Vital signs reviewed and stable  Post vital signs: Reviewed and stable  Last Vitals:  Vitals Value Taken Time  BP 173/99 02/01/22 1454  Temp    Pulse 72 02/01/22 1458  Resp 16 02/01/22 1458  SpO2 94 % 02/01/22 1458  Vitals shown include unvalidated device data.  Last Pain:  Vitals:   02/01/22 1407  TempSrc:   PainSc: 0-No pain      Patients Stated Pain Goal: 0 (01/29/22 1829)  Complications: No notable events documented.

## 2022-02-01 NOTE — Plan of Care (Signed)
?  Problem: Education: ?Goal: Knowledge of [facility] Education information/materials will improve ?Outcome: Progressing ?Goal: Emotional status will improve ?Outcome: Progressing ?Goal: Mental status will improve ?Outcome: Progressing ?Goal: Verbalization of understanding the information provided will improve ?Outcome: Progressing ?  ?Problem: Health Behavior/Discharge Planning: ?Goal: Identification of resources available to assist in meeting health care needs will improve ?Outcome: Progressing ?Goal: Compliance with treatment plan for underlying cause of condition will improve ?Outcome: Progressing ?  ?Problem: Education: ?Goal: Utilization of techniques to improve thought processes will improve ?Outcome: Progressing ?Goal: Knowledge of the prescribed therapeutic regimen will improve ?Outcome: Progressing ?  ?Problem: Activity: ?Goal: Interest or engagement in leisure activities will improve ?Outcome: Progressing ?Goal: Imbalance in normal sleep/wake cycle will improve ?Outcome: Progressing ?  ?Problem: Coping: ?Goal: Coping ability will improve ?Outcome: Progressing ?Goal: Will verbalize feelings ?Outcome: Progressing ?  ?Problem: Role Relationship: ?Goal: Will demonstrate positive changes in social behaviors and relationships ?Outcome: Progressing ?  ?Problem: Safety: ?Goal: Ability to disclose and discuss suicidal ideas will improve ?Outcome: Progressing ?Goal: Ability to identify and utilize support systems that promote safety will improve ?Outcome: Progressing ?  ?Problem: Self-Concept: ?Goal: Will verbalize positive feelings about self ?Outcome: Progressing ?Goal: Level of anxiety will decrease ?Outcome: Progressing ?  ?

## 2022-02-01 NOTE — BH IP Treatment Plan (Signed)
Interdisciplinary Treatment and Diagnostic Plan Update  02/01/2022 Time of Session: 0830 Natasha [name] MRN: [id number]  Principal Diagnosis: Severe recurrent major depression without psychotic features [facility])  Secondary Diagnoses: Principal Problem:   Severe recurrent major depression without psychotic features (HCC) Active Problems:   Paraplegia (HCC)   History of DVT (deep vein thrombosis)   Muscle spasticity   Right foot drop   Current Medications:  Current Facility-Administered Medications  Medication Dose Route Frequency Provider Last Rate Last Admin   acetaminophen (TYLENOL) tablet 650 mg  650 mg Oral Q6H PRN Clapacs, Jackquline Denmark, MD   650 mg at 01/30/22 1121   alum & mag hydroxide-simeth (MAALOX/MYLANTA) 200-200-20 MG/5ML suspension 30 mL  30 mL Oral Q4H PRN Clapacs, Jackquline Denmark, MD   30 mL at 01/30/22 0504   baclofen (LIORESAL) tablet 10 mg  10 mg Oral TID Clapacs, Jackquline Denmark, MD   10 mg at 02/01/22 6226   busPIRone (BUSPAR) tablet 10 mg  10 mg Oral TID Clapacs, Jackquline Denmark, MD   10 mg at 02/01/22 3335   clonazePAM (KLONOPIN) tablet 0.5 mg  0.5 mg Oral Daily PRN Clapacs, Jackquline Denmark, MD       DULoxetine (CYMBALTA) DR capsule 120 mg  120 mg Oral Daily Clapacs, Jackquline Denmark, MD   120 mg at 02/01/22 4562   famotidine (PEPCID) tablet 20 mg  20 mg Oral Daily Clapacs, Jackquline Denmark, MD   20 mg at 02/01/22 5638   hydrOXYzine (ATARAX) tablet 25 mg  25 mg Oral Q6H PRN Clapacs, Jackquline Denmark, MD       hydrOXYzine (ATARAX) tablet 50 mg  50 mg Oral Q6H PRN Clapacs, John T, MD       magnesium hydroxide (MILK OF MAGNESIA) suspension 30 mL  30 mL Oral Daily PRN Clapacs, Jackquline Denmark, MD       meloxicam (MOBIC) tablet 15 mg  15 mg Oral Daily Clapacs, Jackquline Denmark, MD   15 mg at 02/01/22 9373   midazolam (VERSED) injection 2 mg  2 mg Intravenous Once Clapacs, Jackquline Denmark, MD       ondansetron (ZOFRAN) injection 4 mg  4 mg Intravenous Once PRN Corinda Gubler, MD       QUEtiapine (SEROQUEL) tablet 100 mg  100 mg Oral QHS Clapacs, John T, MD   100 mg  at 01/31/22 2103   SUMAtriptan (IMITREX) tablet 100 mg  100 mg Oral Q2H PRN Clapacs, Jackquline Denmark, MD       traZODone (DESYREL) tablet 100 mg  100 mg Oral QHS PRN Clapacs, Jackquline Denmark, MD       PTA Medications: Medications Prior to Admission  Medication Sig Dispense Refill Last Dose   baclofen (LIORESAL) 10 MG tablet Take 1 tablet (10 mg total) by mouth 3 (three) times daily. 90 each 0 01/26/2022   buPROPion (WELLBUTRIN XL) 150 MG 24 hr tablet Take 150 mg by mouth in the morning and at bedtime.   01/26/2022   busPIRone (BUSPAR) 10 MG tablet Take 1 tablet (10 mg total) by mouth 3 (three) times daily. 90 tablet 0 01/26/2022   clonazePAM (KLONOPIN) 0.5 MG tablet Take 1 tablet (0.5 mg total) by mouth daily as needed for anxiety. 30 tablet 0 01/26/2022   diazepam (VALIUM) 5 MG tablet Take 5 mg by mouth 2 (two) times daily as needed.   01/26/2022   DULoxetine (CYMBALTA) 60 MG capsule Take 2 capsules (120 mg total) by mouth daily. 60 capsule 0 01/26/2022   hydrOXYzine (ATARAX/VISTARIL)  25 MG tablet Take 1 tablet (25 mg total) by mouth every 6 (six) hours as needed for anxiety. 30 tablet 0 01/26/2022   meloxicam (MOBIC) 15 MG tablet Take 1 tablet (15 mg total) by mouth daily. 30 tablet 0 01/26/2022   ondansetron (ZOFRAN-ODT) 4 MG disintegrating tablet Take 4 mg by mouth every 8 (eight) hours as needed.   01/26/2022   QUEtiapine (SEROQUEL) 100 MG tablet Take 1 tablet (100 mg total) by mouth at bedtime. 30 tablet 0 01/26/2022   SUMAtriptan (IMITREX) 100 MG tablet Take 100 mg by mouth every 2 (two) hours as needed for migraine.   01/26/2022   VRAYLAR 1.5 MG capsule Take 1 capsule by mouth daily.   01/26/2022    Patient Stressors: Educational concerns   Financial difficulties   Health problems   Traumatic event    Patient Strengths: Ability for insight  Capable of independent living  Communication skills  Motivation for treatment/growth   Treatment Modalities: Medication Management, Group therapy, Case management,  1  to 1 session with clinician, Psychoeducation, Recreational therapy.   Physician Treatment Plan for Primary Diagnosis: Severe recurrent major depression without psychotic features (HCC) Long Term Goal(s): Improvement in symptoms so as ready for discharge   Short Term Goals: Compliance with prescribed medications will improve Ability to verbalize feelings will improve Ability to disclose and discuss suicidal ideas Ability to demonstrate self-control will improve  Medication Management: Evaluate patient's response, side effects, and tolerance of medication regimen.  Therapeutic Interventions: 1 to 1 sessions, Unit Group sessions and Medication administration.  Evaluation of Outcomes: Progressing  Physician Treatment Plan for Secondary Diagnosis: Principal Problem:   Severe recurrent major depression without psychotic features (HCC) Active Problems:   Paraplegia (HCC)   History of DVT (deep vein thrombosis)   Muscle spasticity   Right foot drop  Long Term Goal(s): Improvement in symptoms so as ready for discharge   Short Term Goals: Compliance with prescribed medications will improve Ability to verbalize feelings will improve Ability to disclose and discuss suicidal ideas Ability to demonstrate self-control will improve     Medication Management: Evaluate patient's response, side effects, and tolerance of medication regimen.  Therapeutic Interventions: 1 to 1 sessions, Unit Group sessions and Medication administration.  Evaluation of Outcomes: Progressing   RN Treatment Plan for Primary Diagnosis: Severe recurrent major depression without psychotic features (HCC) Long Term Goal(s): Knowledge of disease and therapeutic regimen to maintain health will improve  Short Term Goals: Ability to remain free from injury will improve, Ability to verbalize frustration and anger appropriately will improve, Ability to demonstrate self-control, Ability to participate in decision making will  improve, Ability to verbalize feelings will improve, Ability to disclose and discuss suicidal ideas, Ability to identify and develop effective coping behaviors will improve, and Compliance with prescribed medications will improve  Medication Management: RN will administer medications as ordered by provider, will assess and evaluate patient's response and provide education to patient for prescribed medication. RN will report any adverse and/or side effects to prescribing provider.  Therapeutic Interventions: 1 on 1 counseling sessions, Psychoeducation, Medication administration, Evaluate responses to treatment, Monitor vital signs and CBGs as ordered, Perform/monitor CIWA, COWS, AIMS and Fall Risk screenings as ordered, Perform wound care treatments as ordered.  Evaluation of Outcomes: Progressing   LCSW Treatment Plan for Primary Diagnosis: Severe recurrent major depression without psychotic features (HCC) Long Term Goal(s): Safe transition to appropriate next level of care at discharge, Engage patient in therapeutic group addressing interpersonal concerns.  Short Term Goals: Engage patient in aftercare planning with referrals and resources, Increase social support, Increase ability to appropriately verbalize feelings, Increase emotional regulation, Facilitate acceptance of mental health diagnosis and concerns, Facilitate patient progression through stages of change regarding substance use diagnoses and concerns, Identify triggers associated with mental health/substance abuse issues, and Increase skills for wellness and recovery  Therapeutic Interventions: Assess for all discharge needs, 1 to 1 time with Social worker, Explore available resources and support systems, Assess for adequacy in community support network, Educate family and significant other(s) on suicide prevention, Complete Psychosocial Assessment, Interpersonal group therapy.  Evaluation of Outcomes: Progressing   Progress in  Treatment: Attending groups: No. Participating in groups: No. Taking medication as prescribed: Yes. Toleration medication: Yes. Family/Significant other contact made: No, will contact:  Patient declined consent for CSW to reach family/friend. Patient understands diagnosis: Yes. Discussing patient identified problems/goals with staff: Yes. Medical problems stabilized or resolved: No. Denies suicidal/homicidal ideation: No. Issues/concerns per patient self-inventory: Yes. 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 States she is anxious and may harm herself if she gets denied.   New Short Term/Long Term Goal(s): Patient to work towards medication management for mood stabilization; elimination of SI thoughts; development of comprehensive mental wellness plan.  Patient Goals:  No additional goals identified at this time.   Discharge Plan or Barriers: No psychosocial barriers identified at this time, patient to return to place of residence when appropriate for discharge.   Reason for Continuation of Hospitalization: Depression  Estimated Length of Stay: 1-7 days   Scribe for Treatment Team: Almedia Balls 02/01/2022 9:18 AM

## 2022-02-01 NOTE — Anesthesia Procedure Notes (Signed)
Date/Time: 02/01/2022 2:34 PM Performed by: Malva Cogan, CRNA Pre-anesthesia Checklist: Patient identified, Patient being monitored, Timeout performed, Emergency Drugs available and Suction available Patient Re-evaluated:Patient Re-evaluated prior to induction Oxygen Delivery Method: Circle system utilized Preoxygenation: Pre-oxygenation with 100% oxygen Ventilation: Mask ventilation without difficulty Airway Equipment and Method: Bite block Dental Injury: Teeth and Oropharynx as per pre-operative assessment

## 2022-02-02 ENCOUNTER — Other Ambulatory Visit: Payer: Self-pay | Admitting: Psychiatry

## 2022-02-02 DIAGNOSIS — F332 Major depressive disorder, recurrent severe without psychotic features: Secondary | ICD-10-CM | POA: Diagnosis not present

## 2022-02-02 LAB — GLUCOSE, CAPILLARY: Glucose-Capillary: 88 mg/dL (ref 70–99)

## 2022-02-02 NOTE — Group Note (Signed)
[facility] LCSW Group Therapy Note ° ° °Group Date: 02/02/2022 °Start Time: 1300 °End Time: 1400 ° ° °Type of Therapy/Topic:  Group Therapy:  Emotion Regulation ° °Participation Level:  Active  ° ° °Description of Group:   ° The purpose of this group is to assist patients in learning to regulate negative emotions and experience positive emotions. Patients will be guided to discuss ways in which they have been vulnerable to their negative emotions. These vulnerabilities will be juxtaposed with experiences of positive emotions or situations, and patients challenged to use positive emotions to combat negative ones. Special emphasis will be placed on coping with negative emotions in conflict situations, and patients will process healthy conflict resolution skills. ° °Therapeutic Goals: °Patient will identify two positive emotions or experiences to reflect on in order to balance out negative emotions:  °Patient will label two or more emotions that they find the most difficult to experience:  °Patient will be able to demonstrate positive conflict resolution skills through discussion or role plays:  ° °Summary of Patient Progress: ° °Patient was present for the entire group; participated to the best of their ability. Patient showed insight, attempted to provide helpful advice to others. Added nuance to conversation and displayed insight into their own situation.  ° ° ° °Therapeutic Modalities:   °Cognitive Behavioral Therapy °Feelings Identification °Dialectical Behavioral Therapy ° ° °Natasha [name], Student-Social Work °

## 2022-02-02 NOTE — Anesthesia Postprocedure Evaluation (Signed)
Anesthesia Post Note  Patient: Natasha [name]  Procedure(s) Performed: ECT TX  Patient location during evaluation: PACU Anesthesia Type: General Level of consciousness: awake and alert Pain management: pain level controlled Vital Signs Assessment: post-procedure vital signs reviewed and stable Respiratory status: spontaneous breathing, nonlabored ventilation, respiratory function stable and patient connected to nasal cannula oxygen Cardiovascular status: blood pressure returned to baseline and stable Postop Assessment: no apparent nausea or vomiting Anesthetic complications: no   No notable events documented.   Last Vitals:  Vitals:   02/01/22 1738 02/02/22 0626  BP: 129/90 122/87  Pulse: 92 85  Resp: 16 17  Temp: 36.8 C 36.6 C  SpO2:  100%    Last Pain:  Vitals:   02/02/22 0859  TempSrc:   PainSc: 0-No pain                 Martha Clan

## 2022-02-02 NOTE — Progress Notes (Signed)
Patient is compliant with medications denies SI/HI/A/VH stated she had a good day today. No adverse drug noted.   Support and encouragement provided. Patient remains safe.

## 2022-02-02 NOTE — Progress Notes (Signed)
Patient denies pain. Patient rates anxiety and depression 4/10. Patient denies SI/HI/AVH. Patient has been cooperative during assessment. Patient compliant with medication administration.  Q15 minute safety checks maintained. Patient remains safe on the unit at this time.

## 2022-02-02 NOTE — Progress Notes (Signed)
[facility] MD Progress Note  02/02/2022 3:22 PM Natasha [name]  MRN:  [id number] Subjective: Follow-up for this 51 year old woman with severe major depression.  Patient seen and chart reviewed.  Patient reports that she is continuing to feel improved but still feels a little dysphoric and blunted.  We discussed her recent purchase of poison at home and she readily agreed that she would be getting rid of it immediately.  Patient feels like she would do better to have at least 1 more ECT treatment prior to discharge Principal Problem: Severe recurrent major depression without psychotic features ([place]) Diagnosis: Principal Problem:   Severe recurrent major depression without psychotic features ([place]) Active Problems:   Paraplegia ([place])   History of DVT (deep vein thrombosis)   Muscle spasticity   Right foot drop  Total Time spent with patient: 30 minutes  Past Psychiatric History: Longstanding severe chronic depression with multiple suicide attempts  Past Medical History:  Past Medical History:  Diagnosis Date   Anxiety    C6 spinal cord injury ([place])    Depression    OCD (obsessive compulsive disorder)    PTSD (post-traumatic stress disorder)     Past Surgical History:  Procedure Laterality Date   BACK SURGERY     NECK SURGERY     Family History:  Family History  Problem Relation Age of Onset   Alcohol abuse Father    Alcohol abuse Brother    Depression Mother    Alcohol abuse Mother    Parkinson's disease Mother    Family Psychiatric  History: See previous Social History:  Social History   Substance and Sexual Activity  Alcohol Use Yes   Alcohol/week: 0.0 standard drinks   Comment: Occasional use     Social History   Substance and Sexual Activity  Drug Use No    Social History   Socioeconomic History   Marital status: Single    Spouse name: Not on file   Number of children: Not on file   Years of education: Not on file   Highest education level: Not on file   Occupational History   Occupation: Disability  Tobacco Use   Smoking status: Former    Packs/day: 0.00    Types: Cigarettes   Smokeless tobacco: Never  Vaping Use   Vaping Use: Never used  Substance and Sexual Activity   Alcohol use: Yes    Alcohol/week: 0.0 standard drinks    Comment: Occasional use   Drug use: No   Sexual activity: Not Currently  Other Topics Concern   Not on file  Social History Narrative   Pt lives alone; receives outpatient psychiatry services through [facility].  Pt is on disability due to a spinal cord injury.    Social Determinants of Health   Financial Resource Strain: Not on file  Food Insecurity: Not on file  Transportation Needs: Not on file  Physical Activity: Not on file  Stress: Not on file  Social Connections: Not on file   Additional Social History:                         Sleep: Fair  Appetite:  Fair  Current Medications: Current Facility-Administered Medications  Medication Dose Route Frequency Provider Last Rate Last Admin   acetaminophen (TYLENOL) tablet 650 mg  650 mg Oral Q6H PRN Tyjanae Bartek, Madie Reno, MD   650 mg at 02/01/22 2010   alum & mag hydroxide-simeth (MAALOX/MYLANTA) 200-200-20 MG/5ML suspension 30 mL  30 mL Oral Q4H PRN Monnie Anspach, Madie Reno, MD   30 mL at 01/30/22 0504   baclofen (LIORESAL) tablet 10 mg  10 mg Oral TID Davon Folta, Madie Reno, MD   10 mg at 02/02/22 1210   busPIRone (BUSPAR) tablet 10 mg  10 mg Oral TID Sena Hoopingarner, Madie Reno, MD   10 mg at 02/02/22 1210   clonazePAM (KLONOPIN) tablet 0.5 mg  0.5 mg Oral Daily PRN Griffon Herberg, Madie Reno, MD       DULoxetine (CYMBALTA) DR capsule 120 mg  120 mg Oral Daily Murl Zogg, Madie Reno, MD   120 mg at 02/02/22 0817   famotidine (PEPCID) tablet 20 mg  20 mg Oral Daily Nicole Hafley, Madie Reno, MD   20 mg at 02/02/22 [id number]   hydrOXYzine (ATARAX) tablet 25 mg  25 mg Oral Q6H PRN Railee Bonillas, Madie Reno, MD       hydrOXYzine (ATARAX) tablet 50 mg  50 mg Oral Q6H PRN Calah Gershman, Madie Reno, MD       magnesium  hydroxide (MILK OF MAGNESIA) suspension 30 mL  30 mL Oral Daily PRN Arline Ketter, Madie Reno, MD       meloxicam (MOBIC) tablet 15 mg  15 mg Oral Daily Sonnie Pawloski, Madie Reno, MD   15 mg at 02/02/22 0817   midazolam (VERSED) injection 2 mg  2 mg Intravenous Once Kary Sugrue, Madie Reno, MD       ondansetron [facility]) injection 4 mg  4 mg Intravenous Once PRN Arita Miss, MD       QUEtiapine (SEROQUEL) tablet 100 mg  100 mg Oral QHS [name] Degraff T, MD   100 mg at 02/01/22 2010   SUMAtriptan (IMITREX) tablet 100 mg  100 mg Oral Q2H PRN Kristine Tiley, Madie Reno, MD       traZODone (DESYREL) tablet 100 mg  100 mg Oral QHS PRN Nylan Nevel, Madie Reno, MD        Lab Results:  Results for orders placed or performed during the hospital encounter of 01/26/22 (from the past 48 hour(s))  Glucose, capillary     Status: None   Collection Time: 02/01/22  6:00 AM  Result Value Ref Range   Glucose-Capillary 81 70 - 99 mg/dL    Comment: Glucose reference range applies only to samples taken after fasting for at least 8 hours.  Glucose, capillary     Status: None   Collection Time: 02/02/22  7:09 AM  Result Value Ref Range   Glucose-Capillary 88 70 - 99 mg/dL    Comment: Glucose reference range applies only to samples taken after fasting for at least 8 hours.    Blood Alcohol level:  Lab Results  Component Value Date   ETH 23 (H) 01/25/2022   ETH <10 [id number]    Metabolic Disorder Labs: Lab Results  Component Value Date   HGBA1C 5.1 01/27/2022   MPG 99.67 01/27/2022   MPG 85.32 10/16/2020   Lab Results  Component Value Date   PROLACTIN 121.9 (H) 02/03/2017   Lab Results  Component Value Date   CHOL 236 (H) 10/16/2020   TRIG 94 10/16/2020   HDL 78 10/16/2020   CHOLHDL 3.0 10/16/2020   VLDL 19 10/16/2020   LDLCALC 139 (H) 10/16/2020   LDLCALC 160 (H) 05/26/2020    Physical Findings: AIMS:  , ,  ,  ,    CIWA:    COWS:     Musculoskeletal: Strength & Muscle Tone: decreased Gait & Station: unable to stand Patient  leans: N/A  Psychiatric  Specialty Exam:  Presentation  General Appearance: Appropriate for Environment  Eye Contact:Good  Speech:Clear and Coherent  Speech Volume:Normal  Handedness:Right   Mood and Affect  Mood:Depressed  Affect:Blunt; Congruent   Thought Process  Thought Processes:Coherent  Descriptions of Associations:Intact  Orientation:Full (Time, Place and Person)  Thought Content:Logical  History of Schizophrenia/Schizoaffective disorder:No  Duration of Psychotic Symptoms:No data recorded Hallucinations:No data recorded Ideas of Reference:None  Suicidal Thoughts:No data recorded Homicidal Thoughts:No data recorded  Sensorium  Memory:Immediate Good; Recent Good; Remote Good  Judgment:Fair  Insight:Poor   Executive Functions  Concentration:Good  Attention Span:Good  [place] of Knowledge:Good  Language:Good   Psychomotor Activity  Psychomotor Activity:No data recorded  Assets  Assets:Communication Skills; Physical Health; Resilience; Social Support; Vocational/Educational   Sleep  Sleep:No data recorded   Physical Exam: Physical Exam Vitals and nursing note reviewed.  Constitutional:      Appearance: Normal appearance.  HENT:     Head: Normocephalic and atraumatic.     Mouth/Throat:     Pharynx: Oropharynx is clear.  Eyes:     Pupils: Pupils are equal, round, and reactive to light.  Cardiovascular:     Rate and Rhythm: Normal rate and regular rhythm.  Pulmonary:     Effort: Pulmonary effort is normal.     Breath sounds: Normal breath sounds.  Abdominal:     General: Abdomen is flat.     Palpations: Abdomen is soft.  Musculoskeletal:        General: Normal range of motion.  Skin:    General: Skin is warm and dry.  Neurological:     General: No focal deficit present.     Mental Status: She is alert. Mental status is at baseline.  Psychiatric:        Attention and Perception: Attention normal.        Mood  and Affect: Mood normal. Affect is blunt.        Speech: Speech is delayed.        Behavior: Behavior is cooperative.        Thought Content: Thought content normal.        Cognition and Memory: Cognition normal.        Judgment: Judgment normal.   Review of Systems  Constitutional: Negative.   HENT: Negative.    Eyes: Negative.   Respiratory: Negative.    Cardiovascular: Negative.   Gastrointestinal: Negative.   Musculoskeletal: Negative.   Skin: Negative.   Neurological: Negative.   Psychiatric/Behavioral:  Positive for depression. Negative for hallucinations, memory loss, substance abuse and suicidal ideas. The patient is not nervous/anxious and does not have insomnia.   Blood pressure 122/87, pulse 85, temperature 97.8 F (36.6 C), temperature source Oral, resp. rate 17, height 5\' 1"  (1.549 m), weight 75.8 kg, SpO2 100 %. Body mass index is 31.55 kg/m.   Treatment Plan Summary: Medication management and Plan no change to medication.  N.p.o. order will be placed.  Next ECT treatment tomorrow with anticipated discharge afterwards.  Alethia Berthold, MD 02/02/2022, 3:22 PM

## 2022-02-03 ENCOUNTER — Inpatient Hospital Stay: Payer: 59 | Admitting: Registered Nurse

## 2022-02-03 ENCOUNTER — Ambulatory Visit: Payer: 59 | Attending: Psychiatry

## 2022-02-03 DIAGNOSIS — F332 Major depressive disorder, recurrent severe without psychotic features: Secondary | ICD-10-CM | POA: Diagnosis not present

## 2022-02-03 LAB — GLUCOSE, CAPILLARY: Glucose-Capillary: 97 mg/dL (ref 70–99)

## 2022-02-03 MED ORDER — LIDOCAINE HCL (PF) 2 % IJ SOLN
INTRAMUSCULAR | Status: AC
  Filled 2022-02-03: qty 10

## 2022-02-03 MED ORDER — BUSPIRONE HCL 10 MG PO TABS: 10.0000 mg | ORAL_TABLET | Freq: Three times a day (TID) | ORAL | 1 refills | Status: DC

## 2022-02-03 MED ORDER — SODIUM CHLORIDE 0.9 % IV SOLN: 500.0000 mL | Freq: Once | INTRAVENOUS | Status: AC

## 2022-02-03 MED ORDER — MIDAZOLAM HCL 2 MG/2ML IJ SOLN
INTRAMUSCULAR | Status: DC | PRN
Start: 2022-02-03 — End: 2022-02-03
  Administered 2022-02-03: 2 mg via INTRAVENOUS

## 2022-02-03 MED ORDER — MIDAZOLAM HCL 2 MG/2ML IJ SOLN: 2.0000 mg | Freq: Once | INTRAMUSCULAR | Status: DC

## 2022-02-03 MED ORDER — CLONAZEPAM 0.5 MG PO TABS: 0.5000 mg | ORAL_TABLET | Freq: Every day | ORAL | 0 refills | Status: DC | PRN

## 2022-02-03 MED ORDER — BACLOFEN 10 MG PO TABS
10.0000 mg | ORAL_TABLET | Freq: Three times a day (TID) | ORAL | 0 refills | Status: DC
Start: 2022-02-03 — End: 2022-06-15

## 2022-02-03 MED ORDER — FAMOTIDINE 20 MG PO TABS: 20.0000 mg | ORAL_TABLET | Freq: Every day | ORAL | 1 refills | Status: DC

## 2022-02-03 MED ORDER — TRAZODONE HCL 100 MG PO TABS: 100.0000 mg | ORAL_TABLET | Freq: Every evening | ORAL | 1 refills | Status: DC | PRN

## 2022-02-03 MED ORDER — METHOHEXITAL SODIUM 100 MG/10ML IV SOSY
PREFILLED_SYRINGE | INTRAVENOUS | Status: DC | PRN
  Administered 2022-02-03: 70 mg via INTRAVENOUS

## 2022-02-03 MED ORDER — SUMATRIPTAN SUCCINATE 100 MG PO TABS: 100.0000 mg | ORAL_TABLET | ORAL | 1 refills | Status: DC | PRN

## 2022-02-03 MED ORDER — SUCCINYLCHOLINE CHLORIDE 200 MG/10ML IV SOSY
PREFILLED_SYRINGE | INTRAVENOUS | Status: AC
Start: 2022-02-03 — End: ?
  Filled 2022-02-03: qty 10

## 2022-02-03 MED ORDER — MIDAZOLAM HCL 2 MG/2ML IJ SOLN
INTRAMUSCULAR | Status: AC
  Filled 2022-02-03: qty 2

## 2022-02-03 MED ORDER — METHOHEXITAL SODIUM 0.5 G IJ SOLR
INTRAMUSCULAR | Status: AC
  Filled 2022-02-03: qty 500

## 2022-02-03 MED ORDER — HYDROXYZINE HCL 50 MG PO TABS: 50.0000 mg | ORAL_TABLET | Freq: Four times a day (QID) | ORAL | 0 refills | Status: DC | PRN

## 2022-02-03 MED ORDER — DULOXETINE HCL 60 MG PO CPEP: 120.0000 mg | ORAL_CAPSULE | Freq: Every day | ORAL | 1 refills | Status: DC

## 2022-02-03 MED ORDER — QUETIAPINE FUMARATE 100 MG PO TABS: 100.0000 mg | ORAL_TABLET | Freq: Every day | ORAL | 1 refills | Status: DC

## 2022-02-03 MED ORDER — LABETALOL HCL 5 MG/ML IV SOLN
INTRAVENOUS | Status: DC | PRN
  Administered 2022-02-03: 5 mg via INTRAVENOUS

## 2022-02-03 MED ORDER — SUCCINYLCHOLINE CHLORIDE 200 MG/10ML IV SOSY
PREFILLED_SYRINGE | INTRAVENOUS | Status: DC | PRN
  Administered 2022-02-03: 80 mg via INTRAVENOUS

## 2022-02-03 MED ORDER — MELOXICAM 15 MG PO TABS: 15.0000 mg | ORAL_TABLET | Freq: Every day | ORAL | 1 refills | Status: DC

## 2022-02-03 NOTE — Procedures (Signed)
ECT SERVICES Physicians Interval Evaluation & Treatment Note  Patient Identification: Jalayla Biagi MRN:  [id number] Date of Evaluation:  02/03/2022 TX #: 4  MADRS:   MMSE:   P.E. Findings:  No change to baseline physical exam with abnormal neurologic exam but heart and lungs and vital signs normal  Psychiatric Interval Note:  Mood much improved no suicidal thoughts no psychosis cognitively doing well  Subjective:  Patient is a 51 y.o. female seen for evaluation for Electroconvulsive Therapy. No complaint  Treatment Summary:   [x]   Right Unilateral             []  Bilateral   % Energy : 0.3 ms 55%   Impedance: 1690 ohms  Seizure Energy Index: 4671 V squared  Postictal Suppression Index: 81%  Seizure Concordance Index: 92%  Medications  Pre Shock: Brevital 60 mg succinylcholine 80 mg  Post Shock: Versed 2 mg  Seizure Duration: 37 seconds EMG 45 seconds EEG   Comments: Patient will be discharged today and will follow up with ECT if she has a return of severe depression and feels it is necessary otherwise continue with her usual outpatient treatment  Lungs:  [x]   Clear to auscultation               []  Other:   Heart:    [x]   Regular rhythm             []  irregular rhythm    [x]   Previous H&P reviewed, patient examined and there are NO CHANGES                 []   Previous H&P reviewed, patient examined and there are changes noted.   Alethia Berthold, MD 2/22/20236:13 PM

## 2022-02-03 NOTE — Plan of Care (Signed)
°  Problem: Education: Goal: Knowledge of [place] [facility] Education information/materials will improve 02/03/2022 1706 by Hyman Hopes, RN Outcome: Adequate for Discharge 02/03/2022 5621 by Hyman Hopes, RN Outcome: Progressing Goal: Emotional status will improve 02/03/2022 1706 by Hyman Hopes, RN Outcome: Adequate for Discharge 02/03/2022 3086 by Hyman Hopes, RN Outcome: Progressing Goal: Mental status will improve 02/03/2022 1706 by Hyman Hopes, RN Outcome: Adequate for Discharge 02/03/2022 5784 by Hyman Hopes, RN Outcome: Progressing Goal: Verbalization of understanding the information provided will improve 02/03/2022 1706 by Hyman Hopes, RN Outcome: Adequate for Discharge 02/03/2022 6962 by Hyman Hopes, RN Outcome: Progressing   Problem: Health Behavior/Discharge Planning: Goal: Identification of resources available to assist in meeting health care needs will improve Outcome: Adequate for Discharge Goal: Compliance with treatment plan for underlying cause of condition will improve Outcome: Adequate for Discharge   Problem: Education: Goal: Utilization of techniques to improve thought processes will improve 02/03/2022 1706 by Hyman Hopes, RN Outcome: Adequate for Discharge 02/03/2022 9528 by Hyman Hopes, RN Outcome: Progressing Goal: Knowledge of the prescribed therapeutic regimen will improve 02/03/2022 1706 by Hyman Hopes, RN Outcome: Adequate for Discharge 02/03/2022 4132 by Hyman Hopes, RN Outcome: Progressing   Problem: Activity: Goal: Interest or engagement in leisure activities will improve Outcome: Adequate for Discharge Goal: Imbalance in normal sleep/wake cycle will improve 02/03/2022 1706 by Hyman Hopes, RN Outcome: Adequate for Discharge 02/03/2022 4401 by Hyman Hopes, RN Outcome: Progressing   Problem: Coping: Goal: Coping ability will improve 02/03/2022 1706 by Hyman Hopes,  RN Outcome: Adequate for Discharge 02/03/2022 0272 by Hyman Hopes, RN Outcome: Progressing Goal: Will verbalize feelings 02/03/2022 1706 by Hyman Hopes, RN Outcome: Adequate for Discharge 02/03/2022 [phone] by Hyman Hopes, RN Outcome: Progressing   Problem: Role Relationship: Goal: Will demonstrate positive changes in social behaviors and relationships Outcome: Adequate for Discharge   Problem: Safety: Goal: Ability to disclose and discuss suicidal ideas will improve 02/03/2022 1706 by Hyman Hopes, RN Outcome: Adequate for Discharge 02/03/2022 4403 by Hyman Hopes, RN Outcome: Progressing Goal: Ability to identify and utilize support systems that promote safety will improve 02/03/2022 1706 by Hyman Hopes, RN Outcome: Adequate for Discharge 02/03/2022 4742 by Hyman Hopes, RN Outcome: Progressing   Problem: Self-Concept: Goal: Will verbalize positive feelings about self 02/03/2022 1706 by Hyman Hopes, RN Outcome: Adequate for Discharge 02/03/2022 [phone] by Hyman Hopes, RN Outcome: Progressing Goal: Level of anxiety will decrease Outcome: Adequate for Discharge   Problem: Group Participation Goal: STG - Patient will engage in groups without prompting or encouragement from LRT x3 group sessions within 5 recreation therapy group sessions Description: STG - Patient will engage in groups without prompting or encouragement from LRT x3 group sessions within 5 recreation therapy group sessions Outcome: Adequate for Discharge

## 2022-02-03 NOTE — Progress Notes (Signed)
Patient denies SI/HI/AVH. Belongings were returned to patient. Discharge instructions  including medication and follow up information were reviewed with patient and understanding was verbalized. Patient was not observed to be in distress at time of discharge. Patient was escorted out with staff to medical mall to be transported home.

## 2022-02-03 NOTE — Discharge Summary (Signed)
Physician Discharge Summary Note  Patient:  Natasha [name] is an 51 y.o., female MRN:  [id number] DOB:  Jun 15, 1971 Patient phone:  [phone] (home)  Patient address:   [address] [facility] [address],  Total Time spent with patient: 30 minutes  Date of Admission:  01/26/2022 Date of Discharge: 02/03/2022  Reason for Admission: Presented to the emergency room with worsening depression and suicidal ideation  Principal Problem: Severe recurrent major depression without psychotic features ([facility]) Discharge Diagnoses: Principal Problem:   Severe recurrent major depression without psychotic features ([facility]) Active Problems:   Paraplegia ([facility])   History of DVT (deep vein thrombosis)   Muscle spasticity   Right foot drop   Past Psychiatric History: Past history of longstanding chronic depression with serious suicide attempts in the past and good response to ECT  Past Medical History:  Past Medical History:  Diagnosis Date   Anxiety    C6 spinal cord injury ([facility])    Depression    OCD (obsessive compulsive disorder)    PTSD (post-traumatic stress disorder)     Past Surgical History:  Procedure Laterality Date   BACK SURGERY     NECK SURGERY     Family History:  Family History  Problem Relation Age of Onset   Alcohol abuse Father    Alcohol abuse Brother    Depression Mother    Alcohol abuse Mother    Parkinson's disease Mother    Family Psychiatric  History: See previous Social History:  Social History   Substance and Sexual Activity  Alcohol Use Yes   Alcohol/week: 0.0 standard drinks   Comment: Occasional use     Social History   Substance and Sexual Activity  Drug Use No    Social History   Socioeconomic History   Marital status: Single    Spouse name: Not on file   Number of children: Not on file   Years of education: Not on file   Highest education level: Not on file  Occupational History   Occupation: Disability   Tobacco Use   Smoking status: Former    Packs/day: 0.00    Types: Cigarettes   Smokeless tobacco: Never  Vaping Use   Vaping Use: Never used  Substance and Sexual Activity   Alcohol use: Yes    Alcohol/week: 0.0 standard drinks    Comment: Occasional use   Drug use: No   Sexual activity: Not Currently  Other Topics Concern   Not on file  Social History Narrative   Pt lives alone; receives outpatient psychiatry services through [facility].  Pt is on disability due to a spinal cord injury.    Social Determinants of Health   Financial Resource Strain: Not on file  Food Insecurity: Not on file  Transportation Needs: Not on file  Physical Activity: Not on file  Stress: Not on file  Social Connections: Not on file    Hospital Course: Patient was admitted to the psychiatric unit.  She was specifically requesting to resume ECT treatment.  Patient was put on the schedule and ECT was restarted.  She has now had 4 right unilateral ECT treatments since coming into the hospital all of them tolerated well.  She has improved with better mood each time.  At the time of discharge she says she is feeling quite good.  She no longer has any suicidal thoughts.  We have discussed her recent purchase of poison at home and she agrees to get rid of all of  that at home while she is still feeling okay.  Continue current medicines.  Follow-up with usual outpatient providers.  She knows she can return if ECT is needed again in the future  Physical Findings: AIMS:  , ,  ,  ,    CIWA:    COWS:     Musculoskeletal: Strength & Muscle Tone: decreased Gait & Station: unable to stand Patient leans: N/A   Psychiatric Specialty Exam:  Presentation  General Appearance: Appropriate for Environment  Eye Contact:Good  Speech:Clear and Coherent  Speech Volume:Normal  Handedness:Right   Mood and Affect  Mood:Depressed  Affect:Blunt; Congruent   Thought Process  Thought  Processes:Coherent  Descriptions of Associations:Intact  Orientation:Full (Time, Place and Person)  Thought Content:Logical  History of Schizophrenia/Schizoaffective disorder:No  Duration of Psychotic Symptoms:No data recorded Hallucinations:No data recorded Ideas of Reference:None  Suicidal Thoughts:No data recorded Homicidal Thoughts:No data recorded  Sensorium  Memory:Immediate Good; Recent Good; Remote Good  Judgment:Fair  Insight:Poor   Executive Functions  Concentration:Good  Attention Span:Good  Recall:Good  [place] of Knowledge:Good  Language:Good   Psychomotor Activity  Psychomotor Activity:No data recorded  Assets  Assets:Communication Skills; Physical Health; Resilience; Social Support; Vocational/Educational   Sleep  Sleep:No data recorded   Physical Exam: Physical Exam Vitals and nursing note reviewed.  Constitutional:      Appearance: Normal appearance.  HENT:     Head: Normocephalic and atraumatic.     Mouth/Throat:     Pharynx: Oropharynx is clear.  Eyes:     Pupils: Pupils are equal, round, and reactive to light.  Cardiovascular:     Rate and Rhythm: Normal rate and regular rhythm.  Pulmonary:     Effort: Pulmonary effort is normal.     Breath sounds: Normal breath sounds.  Abdominal:     General: Abdomen is flat.     Palpations: Abdomen is soft.  Musculoskeletal:        General: Normal range of motion.  Skin:    General: Skin is warm and dry.  Neurological:     Mental Status: She is alert. Mental status is at baseline.     Comments: Chronic neurologic deficits no change from baseline  Psychiatric:        Attention and Perception: Attention normal.        Mood and Affect: Mood normal.        Speech: Speech normal.        Behavior: Behavior normal.        Thought Content: Thought content normal.        Cognition and Memory: Cognition normal.        Judgment: Judgment normal.   ROS Blood pressure 132/83, pulse 88,  temperature 98.2 F (36.8 C), resp. rate 15, height 5\' 1"  (1.549 m), weight 75.8 kg, SpO2 95 %. Body mass index is 31.55 kg/m.   Social History   Tobacco Use  Smoking Status Former   Packs/day: 0.00   Types: Cigarettes  Smokeless Tobacco Never   Tobacco Cessation:  N/A, patient does not currently use tobacco products   Blood Alcohol level:  Lab Results  Component Value Date   ETH 23 (H) 01/25/2022   ETH <10 10/16/2020    Metabolic Disorder Labs:  Lab Results  Component Value Date   HGBA1C 5.1 01/27/2022   MPG 99.67 01/27/2022   MPG 85.32 10/16/2020   Lab Results  Component Value Date   PROLACTIN 121.9 (H) 02/03/2017   Lab Results  Component Value Date  CHOL 236 (H) 10/16/2020   TRIG 94 10/16/2020   HDL 78 10/16/2020   CHOLHDL 3.0 10/16/2020   VLDL 19 10/16/2020   LDLCALC 139 (H) 10/16/2020   LDLCALC 160 (H) 05/26/2020    See Psychiatric Specialty Exam and Suicide Risk Assessment completed by Attending Physician prior to discharge.  Discharge destination:  Home  Is patient on multiple antipsychotic therapies at discharge:  No   Has Patient had three or more failed trials of antipsychotic monotherapy by history:  No  Recommended Plan for Multiple Antipsychotic Therapies: NA  Discharge Instructions     Diet - low sodium heart healthy   Complete by: As directed    Increase activity slowly   Complete by: As directed       Allergies as of 02/03/2022   No Known Allergies      Medication List     STOP taking these medications    buPROPion 150 MG 24 hr tablet Commonly known as: WELLBUTRIN XL   diazepam 5 MG tablet Commonly known as: VALIUM   ondansetron 4 MG disintegrating tablet Commonly known as: ZOFRAN-ODT   Vraylar 1.5 MG capsule Generic drug: cariprazine       TAKE these medications      Indication  baclofen 10 MG tablet Commonly known as: LIORESAL Take 1 tablet (10 mg total) by mouth 3 (three) times daily.  Indication: Muscle  Spasm   busPIRone 10 MG tablet Commonly known as: BUSPAR Take 1 tablet (10 mg total) by mouth 3 (three) times daily.  Indication: Major Depressive Disorder   clonazePAM 0.5 MG tablet Commonly known as: KLONOPIN Take 1 tablet (0.5 mg total) by mouth daily as needed for anxiety.  Indication: Feeling Anxious   DULoxetine 60 MG capsule Commonly known as: CYMBALTA Take 2 capsules (120 mg total) by mouth daily. Start taking on: February 04, 2022  Indication: Major Depressive Disorder   famotidine 20 MG tablet Commonly known as: PEPCID Take 1 tablet (20 mg total) by mouth daily. Start taking on: February 04, 2022  Indication: Gastroesophageal Reflux Disease   hydrOXYzine 50 MG tablet Commonly known as: ATARAX Take 1 tablet (50 mg total) by mouth every 6 (six) hours as needed for anxiety. What changed:  medication strength how much to take  Indication: Feeling Anxious   meloxicam 15 MG tablet Commonly known as: MOBIC Take 1 tablet (15 mg total) by mouth daily.  Indication: Joint Damage causing Pain and Loss of Function   QUEtiapine 100 MG tablet Commonly known as: SEROQUEL Take 1 tablet (100 mg total) by mouth at bedtime.  Indication: Major Depressive Disorder   SUMAtriptan 100 MG tablet Commonly known as: IMITREX Take 1 tablet (100 mg total) by mouth every 2 (two) hours as needed for migraine.  Indication: Migraine Headache   traZODone 100 MG tablet Commonly known as: DESYREL Take 1 tablet (100 mg total) by mouth at bedtime as needed for sleep.  Indication: Trouble Sleeping         Follow-up recommendations: Continue current medicine.  Follow-up with outpatient psychiatric providers.  Get dangerous poisons out of the house and thrown away.  Call back or return when ECT seems important again  Comments: Patient agreeable see plan.  Signed: Mordecai Rasmussen, MD 02/03/2022, 4:50 PM

## 2022-02-03 NOTE — H&P (Signed)
Natasha [name] is an 51 y.o. female.   Chief Complaint: Patient reports mood is still improving.  Doing well.  No suicidal thought HPI: Recurrent severe depression and suicidality with good response to ECT  Past Medical History:  Diagnosis Date   Anxiety    C6 spinal cord injury (HCC)    Depression    OCD (obsessive compulsive disorder)    PTSD (post-traumatic stress disorder)     Past Surgical History:  Procedure Laterality Date   BACK SURGERY     NECK SURGERY      Family History  Problem Relation Age of Onset   Alcohol abuse Father    Alcohol abuse Brother    Depression Mother    Alcohol abuse Mother    Parkinson's disease Mother    Social History:  reports that she has quit smoking. Her smoking use included cigarettes. She has never used smokeless tobacco. She reports current alcohol use. She reports that she does not use drugs.  Allergies: No Known Allergies  No medications prior to admission.    Results for orders placed or performed during the hospital encounter of 01/26/22 (from the past 48 hour(s))  Glucose, capillary     Status: None   Collection Time: 02/02/22  7:09 AM  Result Value Ref Range   Glucose-Capillary 88 70 - 99 mg/dL    Comment: Glucose reference range applies only to samples taken after fasting for at least 8 hours.  Glucose, capillary     Status: None   Collection Time: 02/03/22  6:57 AM  Result Value Ref Range   Glucose-Capillary 97 70 - 99 mg/dL    Comment: Glucose reference range applies only to samples taken after fasting for at least 8 hours.   No results found.  Review of Systems  Constitutional: Negative.   HENT: Negative.    Eyes: Negative.   Respiratory: Negative.    Cardiovascular: Negative.   Gastrointestinal: Negative.   Musculoskeletal: Negative.   Skin: Negative.   Neurological: Negative.   Psychiatric/Behavioral: Negative.  Negative for agitation, behavioral problems and confusion.    Blood pressure 132/83,  pulse 88, temperature 98.2 F (36.8 C), resp. rate 15, height 5\' 1"  (1.549 m), weight 75.8 kg, SpO2 95 %. Physical Exam Vitals and nursing note reviewed.  Constitutional:      Appearance: She is well-developed.  HENT:     Head: Normocephalic and atraumatic.  Eyes:     Conjunctiva/sclera: Conjunctivae normal.     Pupils: Pupils are equal, round, and reactive to light.  Cardiovascular:     Heart sounds: Normal heart sounds.  Pulmonary:     Effort: Pulmonary effort is normal.  Abdominal:     Palpations: Abdomen is soft.  Musculoskeletal:        General: Normal range of motion.     Cervical back: Normal range of motion.  Skin:    General: Skin is warm and dry.  Neurological:     Mental Status: She is alert.     Comments: Patient is quadriplegic but stable.  Limited activity and lower extremity some strength and activity still available and upper extremities  Psychiatric:        Attention and Perception: Attention normal.        Mood and Affect: Mood normal.        Speech: Speech normal.        Behavior: Behavior normal.        Thought Content: Thought content normal.  Cognition and Memory: Cognition normal.     Assessment/Plan Patient is going to receive treatment today after which she will likely be discharged to follow up with her usual outpatient providers  Mordecai Rasmussen, MD 02/03/2022, 6:11 PM

## 2022-02-03 NOTE — Anesthesia Preprocedure Evaluation (Signed)
Anesthesia Evaluation  Patient identified by MRN, date of birth, ID band Patient awake    Reviewed: Allergy & Precautions, NPO status , Patient's Chart, lab work & pertinent test results  History of Anesthesia Complications Negative for: history of anesthetic complications  Airway Mallampati: II  TM Distance: >3 FB Neck ROM: Full    Dental  (+) Poor Dentition, Dental Advidsory Given   Pulmonary neg pulmonary ROS, neg shortness of breath, neg sleep apnea, neg COPD, neg recent URI, Patient abstained from smoking.Not current smoker, former smoker,    breath sounds clear to auscultation- rhonchi (-) wheezing      Cardiovascular Exercise Tolerance: Good METS(-) hypertension(-) angina(-) CAD, (-) Past MI, (-) Cardiac Stents and (-) CABG negative cardio ROS  (-) dysrhythmias  Rhythm:Regular Rate:Normal - Systolic murmurs and - Diastolic murmurs    Neuro/Psych neg Seizures PSYCHIATRIC DISORDERS Anxiety Depression  Neuromuscular disease (Remote history of c6 spinal cord inury with gait instability and weakness. )    GI/Hepatic negative GI ROS, Neg liver ROS, neg GERD  ,(+)     (-) substance abuse  ,   Endo/Other  negative endocrine ROSneg diabetes  Renal/GU negative Renal ROS     Musculoskeletal negative musculoskeletal ROS (+)   Abdominal (+) - obese,   Peds  Hematology negative hematology ROS (+)   Anesthesia Other Findings Past Medical History: No date: Anxiety No date: C6 spinal cord injury ([place]) No date: Depression No date: OCD (obsessive compulsive disorder) No date: PTSD (post-traumatic stress disorder)  Reproductive/Obstetrics                             Anesthesia Physical  Anesthesia Plan  ASA: 2  Anesthesia Plan: General   Post-op Pain Management:    Induction: Intravenous  PONV Risk Score and Plan: 3 and Ondansetron and TIVA  Airway Management Planned: Mask  Additional  Equipment: None  Intra-op Plan:   Post-operative Plan:   Informed Consent: I have reviewed the patients History and Physical, chart, labs and discussed the procedure including the risks, benefits and alternatives for the proposed anesthesia with the patient or authorized representative who has indicated his/her understanding and acceptance.     Dental advisory given  Plan Discussed with: CRNA and Anesthesiologist  Anesthesia Plan Comments: (Discussed risks of anesthesia with patient, including PONV, muscle aches. Rare risks discussed as well, such as cardiorespiratory sequelae, need for airway intervention and its associated risks including lip/dental/eye damage and sore throat, and allergic reactions. Discussed the role of CRNA in patient's perioperative care. Patient understands.)        Anesthesia Quick Evaluation

## 2022-02-03 NOTE — Group Note (Signed)

## 2022-02-03 NOTE — Care Management Important Message (Signed)
Important Message  Patient Details  Name: Natasha [name] MRN: [id number] Date of Birth: Sep 03, 1971   Medicare Important Message Given:  Other (see comment)  Patient requested to be discharged after CSW shift was over. CSW notified about after hours discharge. Patient had no intention of appealing as discharge was demanded by patient.    Corky Crafts, LCSWA 02/03/2022, 6:01 PM

## 2022-02-03 NOTE — Progress Notes (Signed)
Recreation Therapy Notes    Date: 02/03/2022  Time: 11:00 am   Location: Courtyard     Behavioral response: N/A   Intervention Topic: Leisure    Discussion/Intervention: Patient did not attend  group.   Clinical Observations/Feedback:  Patient did not attend group.    Erial Fikes LRT/CTRS        Hence Derrick 02/03/2022 11:59 AM

## 2022-02-03 NOTE — Transfer of Care (Signed)
Immediate Anesthesia Transfer of Care Note  Patient: Natasha [name]  Procedure(s) Performed: ECT TX  Patient Location: PACU  Anesthesia Type:General  Level of Consciousness: drowsy  Airway & Oxygen Therapy: Patient Spontanous Breathing and Patient connected to face mask oxygen  Post-op Assessment: Report given to RN and Post -op Vital signs reviewed and stable  Post vital signs: Reviewed and stable  Last Vitals:  Vitals Value Taken Time  BP 139/87 02/03/22 1420  Temp    Pulse 90 02/03/22 1421  Resp 15 02/03/22 1421  SpO2 97 % 02/03/22 1421  Vitals shown include unvalidated device data.  Last Pain:  Vitals:   02/03/22 1128  TempSrc:   PainSc: 0-No pain      Patients Stated Pain Goal: 0 (01/29/22 1829)  Complications: No notable events documented.

## 2022-02-03 NOTE — Progress Notes (Signed)
Patient NPO at midnight, for ECT. Patient in no distress. Support and encouragement provided.

## 2022-02-03 NOTE — BHH Suicide Risk Assessment (Signed)
[facility] Discharge Suicide Risk Assessment   Principal Problem: Severe recurrent major depression without psychotic features [facility]) Discharge Diagnoses: Principal Problem:   Severe recurrent major depression without psychotic features ([place]) Active Problems:   Paraplegia ([place])   History of DVT (deep vein thrombosis)   Muscle spasticity   Right foot drop   Total Time spent with patient: 30 minutes  Musculoskeletal: Strength & Muscle Tone: decreased Gait & Station: unable to stand Patient leans: N/A  Psychiatric Specialty Exam  Presentation  General Appearance: Appropriate for Environment  Eye Contact:Good  Speech:Clear and Coherent  Speech Volume:Normal  Handedness:Right   Mood and Affect  Mood:Depressed  Duration of Depression Symptoms: Less than two weeks  Affect:Blunt; Congruent   Thought Process  Thought Processes:Coherent  Descriptions of Associations:Intact  Orientation:Full (Time, Place and Person)  Thought Content:Logical  History of Schizophrenia/Schizoaffective disorder:No  Duration of Psychotic Symptoms:No data recorded Hallucinations:No data recorded Ideas of Reference:None  Suicidal Thoughts:No data recorded Homicidal Thoughts:No data recorded  Sensorium  Memory:Immediate Good; Recent Good; Remote Good  Judgment:Fair  Insight:Poor   Executive Functions  Concentration:Good  Attention Span:Good  [place] of Knowledge:Good  Language:Good   Psychomotor Activity  Psychomotor Activity:No data recorded  Assets  Assets:Communication Skills; Physical Health; Resilience; Social Support; Vocational/Educational   Sleep  Sleep:No data recorded  Physical Exam: Physical Exam Vitals and nursing note reviewed.  Constitutional:      Appearance: Normal appearance.  HENT:     Head: Normocephalic and atraumatic.     Mouth/Throat:     Pharynx: Oropharynx is clear.  Eyes:     Pupils: Pupils are equal, round, and reactive to  light.  Cardiovascular:     Rate and Rhythm: Normal rate and regular rhythm.  Pulmonary:     Effort: Pulmonary effort is normal.     Breath sounds: Normal breath sounds.  Abdominal:     General: Abdomen is flat.     Palpations: Abdomen is soft.  Musculoskeletal:        General: Normal range of motion.  Skin:    General: Skin is warm and dry.  Neurological:     Mental Status: She is alert. Mental status is at baseline.     Comments: Patient is quadriplegic with severe deficits in lower body partial deficits and arms.  All stable.  Psychiatric:        Attention and Perception: Attention normal.        Mood and Affect: Mood normal.        Speech: Speech normal.        Behavior: Behavior is cooperative.        Thought Content: Thought content normal.        Cognition and Memory: Cognition normal.        Judgment: Judgment normal.   Review of Systems  Constitutional: Negative.   HENT: Negative.    Eyes: Negative.   Respiratory: Negative.    Cardiovascular: Negative.   Gastrointestinal: Negative.   Musculoskeletal: Negative.   Skin: Negative.   Neurological: Negative.   Psychiatric/Behavioral: Negative.    Blood pressure 132/83, pulse 88, temperature 98.2 F (36.8 C), resp. rate 15, height 5\' 1"  (1.549 m), weight 75.8 kg, SpO2 95 %. Body mass index is 31.55 kg/m.  Mental Status Per Nursing Assessment::   On Admission:  Suicide plan  Demographic Factors:  Living alone  Loss Factors: Decline in physical health  Historical Factors: Impulsivity  Risk Reduction Factors:   Positive social support, Positive therapeutic  relationship, and Positive coping skills or problem solving skills  Continued Clinical Symptoms:  Depression:   Impulsivity  Cognitive Features That Contribute To Risk:  None    Suicide Risk:  Minimal: No identifiable suicidal ideation.  Patients presenting with no risk factors but with morbid ruminations; may be classified as minimal risk based on the  severity of the depressive symptoms    Plan Of Care/Follow-up recommendations:  Patient has had 4 ECT treatments and report her mood is much better.  She has good insight and agrees to get rid of dangerous chemicals at home and to continue outpatient treatment for depression.  Completely denies suicidal ideation at this point.  Not likely to have any further improvement from hospitalization.  Has outpatient treatment in place  Alethia Berthold, MD 02/03/2022, 4:45 PM

## 2022-02-03 NOTE — Anesthesia Postprocedure Evaluation (Signed)
Anesthesia Post Note  Patient: Natasha [name]  Procedure(s) Performed: ECT TX  Patient location during evaluation: PACU Anesthesia Type: General Level of consciousness: awake and alert Pain management: pain level controlled Vital Signs Assessment: post-procedure vital signs reviewed and stable Respiratory status: spontaneous breathing, nonlabored ventilation and respiratory function stable Cardiovascular status: blood pressure returned to baseline and stable Postop Assessment: no apparent nausea or vomiting Anesthetic complications: no   No notable events documented.   Last Vitals:  Vitals:   02/03/22 1430 02/03/22 1440  BP: 133/85 132/83  Pulse: 90 88  Resp: 20 15  Temp:  36.8 C  SpO2: 97% 95%    Last Pain:  Vitals:   02/03/22 1440  TempSrc:   PainSc: 0-No pain                 Iran Ouch

## 2022-02-03 NOTE — Progress Notes (Signed)
°  [facility] Adult Case Management Discharge Plan :  Will you be returning to the same living situation after discharge:  Yes,  Patient to return to place of residence.  At discharge, do you have transportation home?: Yes,  Patient's friend to assist with transportation Do you have the ability to pay for your medications: Yes, patient has [facility] Medicare  Release of information consent forms completed and in the chart;  Patient's signature needed at discharge.  Patient to Follow up at:  Follow-up Information     Center, [facility]. Schedule an appointment as soon as possible for a visit.   Specialty: Behavioral Health Why: Please call to schedule a hospital followup appointment. Contact information: [address] [place] [address] [phone]                Next level of care provider has access to [facility] Link:no  Safety Planning and Suicide Prevention discussed: Yes,  SPE completed with patient, declined consent for CSW to reach collateral.    Has patient been referred to the Quitline?: Patient refused referral  Patient has been referred for addiction treatment: Pt. refused referral  Corky Crafts, LCSWA 02/03/2022, 5:58 PM

## 2022-02-03 NOTE — Plan of Care (Signed)
°  Problem: Education: Goal: Knowledge of [facility] Education information/materials will improve Outcome: Progressing Goal: Emotional status will improve Outcome: Progressing Goal: Mental status will improve Outcome: Progressing Goal: Verbalization of understanding the information provided will improve Outcome: Progressing   Problem: Education: Goal: Utilization of techniques to improve thought processes will improve Outcome: Progressing Goal: Knowledge of the prescribed therapeutic regimen will improve Outcome: Progressing   Problem: Activity: Goal: Imbalance in normal sleep/wake cycle will improve Outcome: Progressing   Problem: Coping: Goal: Coping ability will improve Outcome: Progressing Goal: Will verbalize feelings Outcome: Progressing   Problem: Safety: Goal: Ability to disclose and discuss suicidal ideas will improve Outcome: Progressing Goal: Ability to identify and utilize support systems that promote safety will improve Outcome: Progressing   Problem: Self-Concept: Goal: Will verbalize positive feelings about self Outcome: Progressing

## 2022-02-08 MED ORDER — BUPIVACAINE-EPINEPHRINE (PF) 0.25% -1:200000 IJ SOLN
INTRAMUSCULAR | Status: AC
  Filled 2022-02-08: qty 30

## 2022-02-22 MED ORDER — SODIUM CHLORIDE FLUSH 0.9 % IV SOLN
INTRAVENOUS | Status: AC
  Filled 2022-02-22: qty 10

## 2022-02-24 MED ORDER — MIDAZOLAM HCL 2 MG/2ML IJ SOLN
INTRAMUSCULAR | Status: AC
  Filled 2022-02-24: qty 2

## 2022-02-27 ENCOUNTER — Other Ambulatory Visit: Payer: Self-pay

## 2022-02-27 ENCOUNTER — Emergency Department (HOSPITAL_BASED_OUTPATIENT_CLINIC_OR_DEPARTMENT_OTHER)
Admission: EM | Admit: 2022-02-27 | Discharge: 2022-02-27 | Disposition: A | Discharge status: Home / Self Care | Payer: 59 | Attending: Emergency Medicine | Admitting: Emergency Medicine

## 2022-02-27 ENCOUNTER — Emergency Department (HOSPITAL_BASED_OUTPATIENT_CLINIC_OR_DEPARTMENT_OTHER): Payer: 59 | Admitting: Radiology

## 2022-02-27 DIAGNOSIS — W19XXXA Unspecified fall, initial encounter: Secondary | ICD-10-CM

## 2022-02-27 DIAGNOSIS — M25532 Pain in left wrist: Secondary | ICD-10-CM | POA: Diagnosis not present

## 2022-02-27 DIAGNOSIS — S60222A Contusion of left hand, initial encounter: Secondary | ICD-10-CM | POA: Insufficient documentation

## 2022-02-27 DIAGNOSIS — W050XXA Fall from non-moving wheelchair, initial encounter: Secondary | ICD-10-CM | POA: Diagnosis not present

## 2022-02-27 DIAGNOSIS — M79602 Pain in left arm: Secondary | ICD-10-CM

## 2022-02-27 DIAGNOSIS — S50312A Abrasion of left elbow, initial encounter: Secondary | ICD-10-CM | POA: Insufficient documentation

## 2022-02-27 DIAGNOSIS — S6992XA Unspecified injury of left wrist, hand and finger(s), initial encounter: Secondary | ICD-10-CM | POA: Diagnosis present

## 2022-02-27 MED ORDER — HYDROCODONE-ACETAMINOPHEN 5-325 MG PO TABS
1.0000 | ORAL_TABLET | Freq: Once | ORAL | Status: AC
  Administered 2022-02-27: 1 via ORAL
  Filled 2022-02-27: qty 1

## 2022-02-27 NOTE — ED Notes (Signed)
Called [facility] for transportation at 821pm ?

## 2022-02-27 NOTE — ED Triage Notes (Signed)
Patient arrives via EMS from home due to a fall. Per ems, the patient uses a wheelchair daily (previous spinal injury) and she tripped and fell while getting up to go to the restroom. ? ?Patient arrives with complaints of left shoulder pain. No LOC, does not take blood thinners, and she did not hit her head.  ?

## 2022-02-27 NOTE — Discharge Instructions (Addendum)
X-rays negative for any bony abnormalities.  Wear the sling as needed for comfort.  Follow-up with your doctor.  Return for any new or worse symptoms. ?

## 2022-02-27 NOTE — ED Provider Notes (Signed)
?[facility] DEPT ?Provider Note ? ? ?CSN: [id number] ?Arrival date & time: 02/27/22  1655 ? ?  ? ?History ? ?Chief Complaint  ?Patient presents with  ? Fall  ? Shoulder Injury  ?  Left  ? ? ?Natasha [name] is a 51 y.o. female. ? ?Patient with a fall while trying to transfer from wheelchair.  Brought in by EMS.  Patient without complaint of left wrist pain and left arm pain.  More in the area of the humerus.  Not really up on top of the shoulder area.  Patient has some contractions to both upper extremities and hands.  Patient is wheelchair-bound due to previous spinal injury.  She did not hit her head she had no loss of consciousness.  Patient does have feeling in her hips.  She not worried about a hip fracture. ? ? ?  ? ?Home Medications ?Prior to Admission medications   ?Medication Sig Start Date End Date Taking? Authorizing Provider  ?baclofen (LIORESAL) 10 MG tablet Take 1 tablet (10 mg total) by mouth 3 (three) times daily. 02/03/22   Clapacs, Madie Reno, MD  ?busPIRone (BUSPAR) 10 MG tablet Take 1 tablet (10 mg total) by mouth 3 (three) times daily. 02/03/22   Clapacs, Madie Reno, MD  ?clonazePAM (KLONOPIN) 0.5 MG tablet Take 1 tablet (0.5 mg total) by mouth daily as needed for anxiety. 02/03/22   Clapacs, Madie Reno, MD  ?DULoxetine (CYMBALTA) 60 MG capsule Take 2 capsules (120 mg total) by mouth daily. 02/04/22   Clapacs, Madie Reno, MD  ?famotidine (PEPCID) 20 MG tablet Take 1 tablet (20 mg total) by mouth daily. 02/04/22   Clapacs, Madie Reno, MD  ?hydrOXYzine (ATARAX) 50 MG tablet Take 1 tablet (50 mg total) by mouth every 6 (six) hours as needed for anxiety. 02/03/22   Clapacs, Madie Reno, MD  ?meloxicam (MOBIC) 15 MG tablet Take 1 tablet (15 mg total) by mouth daily. 02/03/22   Clapacs, Madie Reno, MD  ?QUEtiapine (SEROQUEL) 100 MG tablet Take 1 tablet (100 mg total) by mouth at bedtime. 02/03/22   Clapacs, Madie Reno, MD  ?SUMAtriptan (IMITREX) 100 MG tablet Take 1 tablet (100 mg total) by mouth every 2 (two)  hours as needed for migraine. 02/03/22   Clapacs, Madie Reno, MD  ?traZODone (DESYREL) 100 MG tablet Take 1 tablet (100 mg total) by mouth at bedtime as needed for sleep. 02/03/22   Clapacs, Madie Reno, MD  ?   ? ?Allergies    ?Patient has no known allergies.   ? ?Review of Systems   ?Review of Systems  ?Constitutional:  Negative for chills and fever.  ?HENT:  Negative for ear pain and sore throat.   ?Eyes:  Negative for pain and visual disturbance.  ?Respiratory:  Negative for cough and shortness of breath.   ?Cardiovascular:  Negative for chest pain and palpitations.  ?Gastrointestinal:  Negative for abdominal pain and vomiting.  ?Genitourinary:  Negative for dysuria and hematuria.  ?Musculoskeletal:  Positive for joint swelling. Negative for arthralgias and back pain.  ?Skin:  Negative for color change and rash.  ?Neurological:  Negative for seizures and syncope.  ?All other systems reviewed and are negative. ? ?Physical Exam ?Updated Vital Signs ?BP 128/85 (BP Location: Right Arm)   Pulse 85   Temp 97.6 ?F (36.4 ?C) (Oral)   Resp 16   Ht 1.549 m (5\' 1" )   Wt 74.8 kg   SpO2 100%   BMI 31.18 kg/m?  ?Physical Exam ?Vitals  and nursing note reviewed.  ?Constitutional:   ?   General: She is not in acute distress. ?   Appearance: Normal appearance. She is well-developed.  ?HENT:  ?   Head: Normocephalic and atraumatic.  ?Eyes:  ?   Conjunctiva/sclera: Conjunctivae normal.  ?   Pupils: Pupils are equal, round, and reactive to light.  ?Cardiovascular:  ?   Rate and Rhythm: Normal rate and regular rhythm.  ?   Heart sounds: No murmur heard. ?Pulmonary:  ?   Effort: Pulmonary effort is normal. No respiratory distress.  ?   Breath sounds: Normal breath sounds.  ?Abdominal:  ?   Palpations: Abdomen is soft.  ?   Tenderness: There is no abdominal tenderness.  ?Musculoskeletal:     ?   General: Swelling and tenderness present.  ?   Cervical back: Neck supple.  ?   Comments: Patient with some bruising to the back of the left hand.   Tenderness palpation to the left wrist area.  And also with some tenderness to palpation midshaft of the humerus.  No obvious deformity.  No pain with movement of the shoulder or elbow there is an abrasion to the back of the elbow superficial.  Radial pulses 2+.  Sensation baseline for her.  Patient has some contractions of her fingers on the left hand as well as on the right hand.  ?Skin: ?   General: Skin is warm and dry.  ?   Capillary Refill: Capillary refill takes less than 2 seconds.  ?Neurological:  ?   Mental Status: She is alert. Mental status is at baseline.  ?Psychiatric:     ?   Mood and Affect: Mood normal.  ? ? ?ED Results / Procedures / Treatments   ?Labs ?(all labs ordered are listed, but only abnormal results are displayed) ?Labs Reviewed - No data to display ? ?EKG ?None ? ?Radiology ?DG Wrist Complete Left ? ?Result Date: 02/27/2022 ?CLINICAL DATA:  Fall. Trip and fall getting up to go to the restroom. Left shoulder/humerus and wrist pain. EXAM: LEFT WRIST - COMPLETE 3+ VIEW COMPARISON:  07/07/2016 FINDINGS: There is no evidence of fracture or dislocation. There is no evidence of arthropathy or other focal bone abnormality. Mild soft tissue edema. IMPRESSION: Soft tissue edema. No fracture or subluxation. Electronically Signed   By: Keith Rake M.D.   On: 02/27/2022 18:58  ? ?DG Humerus Left ? ?Result Date: 02/27/2022 ?CLINICAL DATA:  Fall. Trip and fall getting up to go to the restroom. Left shoulder/humerus and wrist pain. EXAM: LEFT HUMERUS - 2+ VIEW COMPARISON:  None. FINDINGS: The cortical margins of the humerus are intact. There is no evidence of fracture or other focal bone lesions. Shoulder and elbow alignment are maintained. Soft tissues are unremarkable. IMPRESSION: No fracture of the left humerus. Electronically Signed   By: Keith Rake M.D.   On: 02/27/2022 18:57   ? ?Procedures ?Procedures  ? ? ?Medications Ordered in ED ?Medications  ?HYDROcodone-acetaminophen  (NORCO/VICODIN) 5-325 MG per tablet 1 tablet (1 tablet Oral Given 02/27/22 1902)  ? ? ?ED Course/ Medical Decision Making/ A&P ?  ?                        ?Medical Decision Making ?Amount and/or Complexity of Data Reviewed ?Radiology: ordered. ? ?Risk ?Prescription drug management. ? ? ?Patient concentrates on the pain being at the left wrist and left arm area.  We will get x-ray of the left  humerus and left wrist. ? ?X-ray of the wrist and humerus without any bony abnormalities.  We will double check about snuffbox tenderness.  If so we will treat with a Velcro splint.  If negative will have patient follow-up with her doctors.  And symptomatic treatment. ? ?No snuffbox tenderness.  Patient is not requesting any pain medicine for home. ? ? ?Final Clinical Impression(s) / ED Diagnoses ?Final diagnoses:  ?Fall, initial encounter  ?Wrist pain, acute, left  ?Pain of left upper extremity  ? ? ?Rx / DC Orders ?ED Discharge Orders   ? ? None  ? ?  ? ? ?  ?Fredia Sorrow, MD ?02/27/22 1955 ? ?

## 2022-03-05 MED ORDER — PROPOFOL 10 MG/ML IV BOLUS
INTRAVENOUS | Status: AC
  Filled 2022-03-05: qty 20

## 2022-03-08 MED ORDER — SODIUM CHLORIDE FLUSH 0.9 % IV SOLN
INTRAVENOUS | Status: AC
  Filled 2022-03-08: qty 10

## 2022-03-15 MED ORDER — PROPOFOL 500 MG/50ML IV EMUL
INTRAVENOUS | Status: AC
  Filled 2022-03-15: qty 50

## 2022-03-29 ENCOUNTER — Ambulatory Visit: Payer: 59 | Admitting: Psychologist

## 2022-03-31 ENCOUNTER — Encounter (HOSPITAL_BASED_OUTPATIENT_CLINIC_OR_DEPARTMENT_OTHER): Payer: Self-pay

## 2022-03-31 ENCOUNTER — Emergency Department (HOSPITAL_BASED_OUTPATIENT_CLINIC_OR_DEPARTMENT_OTHER)
Admission: EM | Admit: 2022-03-31 | Discharge: 2022-04-01 | Disposition: A | Discharge status: Home / Self Care | Payer: 59 | Attending: Emergency Medicine | Admitting: Emergency Medicine

## 2022-03-31 ENCOUNTER — Other Ambulatory Visit: Payer: Self-pay

## 2022-03-31 DIAGNOSIS — S51812A Laceration without foreign body of left forearm, initial encounter: Secondary | ICD-10-CM | POA: Diagnosis not present

## 2022-03-31 DIAGNOSIS — S59912A Unspecified injury of left forearm, initial encounter: Secondary | ICD-10-CM | POA: Diagnosis present

## 2022-03-31 MED ORDER — PROPOFOL 500 MG/50ML IV EMUL
INTRAVENOUS | Status: AC
  Filled 2022-03-31: qty 50

## 2022-03-31 MED ORDER — BACITRACIN ZINC 500 UNIT/GM EX OINT: TOPICAL_OINTMENT | Freq: Two times a day (BID) | CUTANEOUS | Status: DC

## 2022-03-31 MED ORDER — LIDOCAINE-EPINEPHRINE (PF) 2 %-1:200000 IJ SOLN
10.0000 mL | Freq: Once | INTRAMUSCULAR | Status: AC
  Administered 2022-03-31: 10 mL
  Filled 2022-03-31: qty 20

## 2022-03-31 NOTE — ED Notes (Signed)
Spoke to [place], she will be 5-6th in line.  ?

## 2022-03-31 NOTE — ED Triage Notes (Signed)
Patient BIB [facility] from Home. ? ?Patient sustained approximately 4.5 cm Laceration to Right Proximal Forearm while cutting Tomato.  ? ?Bleeding Controlled. Irrigated in Triage. No Anticoagulants. No Other Injuries. ? ?A&Ox4. GCS 15. BIB Wheelchair. NAD Noted during Triage. ? ? ?

## 2022-03-31 NOTE — ED Provider Notes (Signed)
?[facility] EM[facility] DEPT ?Provider Note ? ? ?CSN: [id number] ?Arrival date & time: 03/31/22  1855 ? ?  ? ?History ? ?Chief Complaint  ?Patient presents with  ? Laceration  ? ? ?Natasha [name] is a 51 y.o. female. ? ? ?Laceration ?Patient is a 51 year old female presented emergency room today with laceration to the left forearm.  Seems that she injured herself while cutting a tomato within the past few hours. ? ?She is up-to-date on tetanus.  Denies any other associated symptoms. ?  ? ?Home Medications ?Prior to Admission medications   ?Medication Sig Start Date End Date Taking? Authorizing Provider  ?baclofen (LIORESAL) 10 MG tablet Take 1 tablet (10 mg total) by mouth 3 (three) times daily. 02/03/22   Clapacs, Jackquline DenmarkJohn T, MD  ?busPIRone (BUSPAR) 10 MG tablet Take 1 tablet (10 mg total) by mouth 3 (three) times daily. 02/03/22   Clapacs, Jackquline DenmarkJohn T, MD  ?clonazePAM (KLONOPIN) 0.5 MG tablet Take 1 tablet (0.5 mg total) by mouth daily as needed for anxiety. 02/03/22   Clapacs, Jackquline DenmarkJohn T, MD  ?DULoxetine (CYMBALTA) 60 MG capsule Take 2 capsules (120 mg total) by mouth daily. 02/04/22   Clapacs, Jackquline DenmarkJohn T, MD  ?famotidine (PEPCID) 20 MG tablet Take 1 tablet (20 mg total) by mouth daily. 02/04/22   Clapacs, Jackquline DenmarkJohn T, MD  ?hydrOXYzine (ATARAX) 50 MG tablet Take 1 tablet (50 mg total) by mouth every 6 (six) hours as needed for anxiety. 02/03/22   Clapacs, Jackquline DenmarkJohn T, MD  ?meloxicam (MOBIC) 15 MG tablet Take 1 tablet (15 mg total) by mouth daily. 02/03/22   Clapacs, Jackquline DenmarkJohn T, MD  ?QUEtiapine (SEROQUEL) 100 MG tablet Take 1 tablet (100 mg total) by mouth at bedtime. 02/03/22   Clapacs, Jackquline DenmarkJohn T, MD  ?SUMAtriptan (IMITREX) 100 MG tablet Take 1 tablet (100 mg total) by mouth every 2 (two) hours as needed for migraine. 02/03/22   Clapacs, Jackquline DenmarkJohn T, MD  ?traZODone (DESYREL) 100 MG tablet Take 1 tablet (100 mg total) by mouth at bedtime as needed for sleep. 02/03/22   Clapacs, Jackquline DenmarkJohn T, MD  ?   ? ?Allergies    ?Patient has no known allergies.    ? ?Review of Systems   ?Review of Systems ? ?Physical Exam ?Updated Vital Signs ?BP 130/90 (BP Location: Right Arm)   Pulse 85   Temp 98.7 ?F (37.1 ?C)   Resp 16   Ht 5\' 1"  (1.549 m)   Wt 74.8 kg   SpO2 98%   BMI 31.16 kg/m?  ?Physical Exam ?Vitals and nursing note reviewed.  ?Constitutional:   ?   General: She is not in acute distress. ?   Appearance: Normal appearance. She is not ill-appearing.  ?HENT:  ?   Head: Normocephalic and atraumatic.  ?Eyes:  ?   General: No scleral icterus.    ?   Right eye: No discharge.     ?   Left eye: No discharge.  ?   Conjunctiva/sclera: Conjunctivae normal.  ?Pulmonary:  ?   Effort: Pulmonary effort is normal.  ?   Breath sounds: No stridor.  ?Skin: ?   General: Skin is warm and dry.  ?   Comments: 5 cm long gaping laceration to the volar mid left forearm ? ?Bilateral radial artery pulses 3+ and symmetric.  No active bleeding  ?Neurological:  ?   Mental Status: She is alert and oriented to person, place, and time. Mental status is at baseline.  ? ? ?ED Results / Procedures /  Treatments   ?Labs ?(all labs ordered are listed, but only abnormal results are displayed) ?Labs Reviewed - No data to display ? ?EKG ?None ? ?Radiology ?No results found. ? ?Procedures ?[name] Kitchen.Laceration Repair ? ?Date/Time: 03/31/2022 10:14 PM ?Performed by: Gailen Shelter, PA ?Authorized by: Gailen Shelter, PA  ? ?Consent:  ?  Consent obtained:  Verbal ?  Consent given by:  Patient ?  Risks discussed:  Infection, need for additional repair, pain, poor cosmetic result and poor wound healing ?  Alternatives discussed:  No treatment and delayed treatment ?Universal protocol:  ?  Procedure explained and questions answered to patient or proxy's satisfaction: yes   ?  Relevant documents present and verified: yes   ?  Test results available: yes   ?  Imaging studies available: yes   ?  Required blood products, implants, devices, and special equipment available: yes   ?  Site/side marked: yes   ?   Immediately prior to procedure, a time out was called: yes   ?  Patient identity confirmed:  Verbally with patient ?Anesthesia:  ?  Anesthesia method:  Local infiltration ?  Local anesthetic:  Lidocaine 2% WITH epi ?Laceration details:  ?  Location:  Shoulder/arm ?  Shoulder/arm location:  L lower arm ?  Length (cm):  5 ?Exploration:  ?  Hemostasis achieved with:  Direct pressure and epinephrine ?  Wound extent: no foreign bodies/material noted and no tendon damage noted   ?  Contaminated: no   ?Treatment:  ?  Area cleansed with:  Saline ?  Amount of cleaning:  Standard ?  Irrigation solution:  Sterile saline ?  Irrigation volume:  Copious ?  Irrigation method:  Pressure wash ?  Visualized foreign bodies/material removed: no   ?Skin repair:  ?  Repair method:  Sutures ?  Suture size:  3-0 ?  Suture material:  Plain gut ?  Suture technique:  Simple interrupted ?  Number of sutures:  5 ?Approximation:  ?  Approximation:  Close ?Repair type:  ?  Repair type:  Intermediate ?Post-procedure details:  ?  Dressing:  Antibiotic ointment and non-adherent dressing ?  Procedure completion:  Tolerated well, no immediate complications  ? ? ?Medications Ordered in ED ?Medications  ?bacitracin ointment (has no administration in time range)  ?lidocaine-EPINEPHrine (XYLOCAINE W/EPI) 2 %-1:200000 (PF) injection 10 mL (10 mLs Infiltration Given by Other 03/31/22 2100)  ? ? ?ED Course/ Medical Decision Making/ A&P ?  ?                        ?Medical Decision Making ?Risk ?OTC drugs. ?Prescription drug management. ? ? ?Patient is a 51 year old female presented emergency room today with laceration to the left forearm.  Seems that she injured herself while cutting a tomato within the past few hours. ? ?She is up-to-date on tetanus.  Denies any other associated symptoms. ? ? ?5 cm laceration sutured with 5x   3-0 plain gut sutures.  She requested absorbable sutures.  She understands the possibility of more scarring than with Prolene. ? ?She  is distally neurovascularly intact.  I explored the depth of the laceration I do not suspect foreign bodies.  I discussed with the patient's friend over the phone who states that the scans of injuries happen for this patient.  The patient states that there was absolutely no self-harm intended and this is an injury that results from her paraplegia. ? ?Area was thoroughly cleaned.  She  is up-to-date on tetanus.  Return precautions were given. ? ?Final Clinical Impression(s) / ED Diagnoses ?Final diagnoses:  ?Laceration of skin of left forearm, initial encounter  ? ? ?Rx / DC Orders ?ED Discharge Orders   ? ? None  ? ?  ? ? ?  ?Gailen Shelter, [place] ?03/31/22 2215 ? ?  ?Cathren Laine, MD ?04/01/22 1503 ? ?

## 2022-03-31 NOTE — Discharge Instructions (Signed)
Sutured repair ?Keep the laceration site dry for the next 24 hours and leave the dressing in place. After 24 hours you may remove the dressing and gently clean the laceration site with antibacterial soap and warm water. Do not scrub the area. Do not soak the area and water for long periods of time. Don?t use hydrogen peroxide, iodine-based solutions, or alcohol, which can slow healing, and will probably be painful! Apply topical bacitracin 1-2 times per day for the next 3-5 days. Return to the emergency department for any signs of infection which would include increased redness around the wound, increased swelling, new drainage of yellow pus.  ? ? ? ?

## 2022-03-31 NOTE — ED Triage Notes (Signed)
Pt arrives via [facility] from home. She was cutting veggies and cut her forearm (approx 5 inches long, 3/8th wide). Bleeding controlled. En route 144/86, hr 90. Pt has hx of spinal cord injury, w/c bound ?

## 2022-04-01 ENCOUNTER — Ambulatory Visit (INDEPENDENT_AMBULATORY_CARE_PROVIDER_SITE_OTHER): Payer: 59 | Admitting: Psychologist

## 2022-04-01 DIAGNOSIS — F332 Major depressive disorder, recurrent severe without psychotic features: Secondary | ICD-10-CM

## 2022-04-01 NOTE — ED Notes (Signed)
[facility] arrived for transport 
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
## 2022-04-01 NOTE — Progress Notes (Signed)
                Melvie Paglia, PsyD 

## 2022-04-21 ENCOUNTER — Encounter (HOSPITAL_BASED_OUTPATIENT_CLINIC_OR_DEPARTMENT_OTHER): Payer: Self-pay

## 2022-04-21 ENCOUNTER — Other Ambulatory Visit: Payer: Self-pay

## 2022-04-21 ENCOUNTER — Emergency Department (HOSPITAL_BASED_OUTPATIENT_CLINIC_OR_DEPARTMENT_OTHER)
Admission: EM | Admit: 2022-04-21 | Discharge: 2022-04-21 | Disposition: A | Discharge status: Home / Self Care | Payer: 59 | Attending: Emergency Medicine | Admitting: Emergency Medicine

## 2022-04-21 DIAGNOSIS — F419 Anxiety disorder, unspecified: Secondary | ICD-10-CM | POA: Insufficient documentation

## 2022-04-21 DIAGNOSIS — W260XXA Contact with knife, initial encounter: Secondary | ICD-10-CM | POA: Insufficient documentation

## 2022-04-21 DIAGNOSIS — R4588 Nonsuicidal self-harm: Secondary | ICD-10-CM | POA: Insufficient documentation

## 2022-04-21 DIAGNOSIS — F32A Depression, unspecified: Secondary | ICD-10-CM | POA: Insufficient documentation

## 2022-04-21 DIAGNOSIS — S59912A Unspecified injury of left forearm, initial encounter: Secondary | ICD-10-CM | POA: Diagnosis present

## 2022-04-21 DIAGNOSIS — S51812A Laceration without foreign body of left forearm, initial encounter: Secondary | ICD-10-CM | POA: Insufficient documentation

## 2022-04-21 MED ORDER — LIDOCAINE-EPINEPHRINE 2 %-1:200000 IJ SOLN
10.0000 mL | Freq: Once | INTRAMUSCULAR | Status: AC
Start: 2022-04-21 — End: 2022-04-21
  Administered 2022-04-21: 10 mL via INTRADERMAL
  Filled 2022-04-21: qty 20

## 2022-04-21 MED ORDER — BACITRACIN ZINC 500 UNIT/GM EX OINT
TOPICAL_OINTMENT | Freq: Once | CUTANEOUS | Status: AC
  Administered 2022-04-21: 31.5 via TOPICAL
  Filled 2022-04-21: qty 28.35

## 2022-04-21 MED ORDER — PROPOFOL 500 MG/50ML IV EMUL
INTRAVENOUS | Status: AC
  Filled 2022-04-21: qty 50

## 2022-04-21 NOTE — ED Triage Notes (Addendum)
Arrives EMS from home.  ? ?Approx 4cm laceration to left anterior forearm from kitchen knife.  ? ?Denies suicidal ideation but admits this is a recurrent issue with stress overload. Talked to phychiatric and has new medications ordered to help with repetitive behaviors.  ?

## 2022-04-21 NOTE — Discharge Instructions (Addendum)
If you feel like harming yourself, have suicidal thoughts or intents, or any other new/concerning symptoms then return to the ER for evaluation.  Apply antibiotic ointment and keep the wound clean.  If you notice fever, redness or increasing pain or swelling, or any other new/concerning symptoms then return to the ER for evaluation. ?

## 2022-04-21 NOTE — ED Notes (Signed)
Lidocaine left at bedside

## 2022-04-21 NOTE — ED Provider Notes (Signed)
?[facility] ?Provider Note ? ? ?CSN: [id number] ?Arrival date & time: 04/21/22  2007 ? ?  ? ?History ? ?Chief Complaint  ?Patient presents with  ? Extremity Laceration  ? ? ?Natasha [name] is a 51 y.o. female. ? ?HPI ?51 year old female presents with left volar forearm laceration.  She states she has been having some progressively worsening depression and stress and anxiety.  Recently found out that she had a application for school rejected.  Due to all of this and her anxiety being high she used a kitchen knife to lacerate her left forearm.  She has a history of doing this multiple times in the past.  She states been a rough month and most recently did this a few weeks ago when she was seen in the emergency department.  She denies suicidal thoughts or intent.  She has had a suicide attempt in the past which is how she injured her neck about 20 years ago. ? ?She was in contact with her doctor today and they are starting her on a new medicine for depression.  She has not yet started it as it was just sent in today.  She is planning to call him tomorrow as well. ? ?Tdap up-to-date from 1 year ago. ? ?Home Medications ?Prior to Admission medications   ?Medication Sig Start Date End Date Taking? Authorizing Provider  ?baclofen (LIORESAL) 10 MG tablet Take 1 tablet (10 mg total) by mouth 3 (three) times daily. 02/03/22   Clapacs, Jackquline Denmark, MD  ?busPIRone (BUSPAR) 10 MG tablet Take 1 tablet (10 mg total) by mouth 3 (three) times daily. 02/03/22   Clapacs, Jackquline Denmark, MD  ?clonazePAM (KLONOPIN) 0.5 MG tablet Take 1 tablet (0.5 mg total) by mouth daily as needed for anxiety. 02/03/22   Clapacs, Jackquline Denmark, MD  ?DULoxetine (CYMBALTA) 60 MG capsule Take 2 capsules (120 mg total) by mouth daily. 02/04/22   Clapacs, Jackquline Denmark, MD  ?famotidine (PEPCID) 20 MG tablet Take 1 tablet (20 mg total) by mouth daily. 02/04/22   Clapacs, Jackquline Denmark, MD  ?hydrOXYzine (ATARAX) 50 MG tablet Take 1 tablet (50 mg total) by mouth  every 6 (six) hours as needed for anxiety. 02/03/22   Clapacs, Jackquline Denmark, MD  ?meloxicam (MOBIC) 15 MG tablet Take 1 tablet (15 mg total) by mouth daily. 02/03/22   Clapacs, Jackquline Denmark, MD  ?QUEtiapine (SEROQUEL) 100 MG tablet Take 1 tablet (100 mg total) by mouth at bedtime. 02/03/22   Clapacs, Jackquline Denmark, MD  ?SUMAtriptan (IMITREX) 100 MG tablet Take 1 tablet (100 mg total) by mouth every 2 (two) hours as needed for migraine. 02/03/22   Clapacs, Jackquline Denmark, MD  ?traZODone (DESYREL) 100 MG tablet Take 1 tablet (100 mg total) by mouth at bedtime as needed for sleep. 02/03/22   Clapacs, Jackquline Denmark, MD  ?   ? ?Allergies    ?Patient has no known allergies.   ? ?Review of Systems   ?Review of Systems  ?Skin:  Positive for wound.  ?Neurological:  Negative for weakness and numbness.  ?Psychiatric/Behavioral:  Positive for dysphoric mood and self-injury. The patient is nervous/anxious.   ? ?Physical Exam ?Updated Vital Signs ?BP (!) 147/100 (BP Location: Right Arm)   Pulse 83   Temp 98.3 ?F (36.8 ?C) (Oral)   Resp 16   Ht 5' (1.524 m)   Wt 74.8 kg   SpO2 100%   BMI 32.22 kg/m?  ?Physical Exam ?Vitals and nursing note reviewed.  ?Constitutional:   ?  Appearance: She is well-developed.  ?HENT:  ?   Head: Normocephalic and atraumatic.  ?Cardiovascular:  ?   Rate and Rhythm: Normal rate and regular rhythm.  ?   Pulses:     ?     Radial pulses are 2+ on the left side.  ?Pulmonary:  ?   Effort: Pulmonary effort is normal.  ?Abdominal:  ?   General: There is no distension.  ?Musculoskeletal:  ?   Comments: Left volar forearm with numerous linear healed wounds consistent with prior cutting.  There is 1 approximately 5 cm linear laceration present now with fat exposed.  No numbness or weakness on exam besides her chronic issues with her left arm.  ?Skin: ?   General: Skin is warm and dry.  ?Neurological:  ?   Mental Status: She is alert.  ? ? ?ED Results / Procedures / Treatments   ?Labs ?(all labs ordered are listed, but only abnormal results are  displayed) ?Labs Reviewed - No data to display ? ?EKG ?None ? ?Radiology ?No results found. ? ?Procedures ?[name] Kitchen..Laceration Repair ? ?Date/Time: 04/21/2022 9:18 PM ?Performed by: Pricilla LovelessGoldston, Spyridon Hornstein, MD ?Authorized by: Pricilla LovelessGoldston, Warrene Kapfer, MD  ? ?Consent:  ?  Consent obtained:  Verbal ?  Consent given by:  Patient ?  Risks, benefits, and alternatives were discussed: yes   ?Universal protocol:  ?  Patient identity confirmed:  Verbally with patient ?Anesthesia:  ?  Anesthesia method:  Local infiltration ?  Local anesthetic:  Lidocaine 2% WITH epi ?Laceration details:  ?  Location:  Shoulder/arm ?  Shoulder/arm location:  L lower arm ?  Length (cm):  5 ?Pre-procedure details:  ?  Preparation:  Patient was prepped and draped in usual sterile fashion ?Exploration:  ?  Hemostasis achieved with:  Direct pressure ?Treatment:  ?  Area cleansed with:  Saline ?  Amount of cleaning:  Standard ?  Irrigation solution:  Sterile saline ?  Irrigation method:  Syringe ?  Debridement:  None ?  Undermining:  None ?Skin repair:  ?  Repair method:  Sutures ?  Suture size:  4-0 ?  Suture material:  Nylon ?  Suture technique:  Simple interrupted ?  Number of sutures:  5 ?Approximation:  ?  Approximation:  Close ?Repair type:  ?  Repair type:  Simple ?Post-procedure details:  ?  Dressing:  Antibiotic ointment ?  Procedure completion:  Tolerated well, no immediate complications  ? ? ?Medications Ordered in ED ?Medications  ?lidocaine-EPINEPHrine (XYLOCAINE W/EPI) 2 %-1:200000 (PF) injection 10 mL (10 mLs Intradermal Given by Other 04/21/22 2116)  ?bacitracin ointment (31.5 application. Topical Given 04/21/22 2133)  ? ? ?ED Course/ Medical Decision Making/ A&P ?  ?                        ?Medical Decision Making ?Amount and/or Complexity of Data Reviewed ?External Data Reviewed: notes. ? ?Risk ?OTC drugs. ?Prescription drug management. ? ? ?Chart review shows that she has had a issue with depression and self injury in the past.  She denies any suicidal  thoughts and I have discussed this with her multiple times.  This appears to be more of a reaction to acute anxiety and has been for quite some time.  The laceration itself is not bad and was repaired as above.  Tdap is up-to-date.  Otherwise, I offered for her to talk to TTS but she declines.  She is going home with a friend and is calling her  doctor in the morning and so my suspicion is fairly low that she is at risk for suicide at this point.  She was given return precautions. ? ? ? ? ? ? ? ?Final Clinical Impression(s) / ED Diagnoses ?Final diagnoses:  ?Forearm laceration, left, initial encounter  ?Nonsuicidal self-injury (HCC)  ? ? ?Rx / DC Orders ?ED Discharge Orders   ? ? None  ? ?  ? ? ?  ?Pricilla Loveless, MD ?04/21/22 2328 ? ?

## 2022-04-21 NOTE — ED Notes (Signed)
Discharge paperwork given and understood. 

## 2022-04-22 MED ORDER — SODIUM CHLORIDE FLUSH 0.9 % IV SOLN
INTRAVENOUS | Status: AC
  Filled 2022-04-22: qty 10

## 2022-04-23 MED ORDER — SODIUM CHLORIDE FLUSH 0.9 % IV SOLN
INTRAVENOUS | Status: AC
  Filled 2022-04-23: qty 10

## 2022-05-06 MED ORDER — PROPOFOL 10 MG/ML IV BOLUS
INTRAVENOUS | Status: AC
  Filled 2022-05-06: qty 20

## 2022-05-07 MED ORDER — ESMOLOL HCL 100 MG/10ML IV SOLN
INTRAVENOUS | Status: AC
  Filled 2022-05-07: qty 10

## 2022-05-18 MED ORDER — PHENYLEPHRINE HCL-NACL 20-0.9 MG/250ML-% IV SOLN
INTRAVENOUS | Status: AC
  Filled 2022-05-18: qty 250

## 2022-05-19 MED ORDER — SODIUM CHLORIDE FLUSH 0.9 % IV SOLN
INTRAVENOUS | Status: AC
  Filled 2022-05-19: qty 10

## 2022-05-19 MED ORDER — POTASSIUM CHLORIDE CRYS ER 20 MEQ PO TBCR
EXTENDED_RELEASE_TABLET | ORAL | Status: AC
  Filled 2022-05-19: qty 2

## 2022-05-19 MED ORDER — FUROSEMIDE 10 MG/ML IJ SOLN
INTRAMUSCULAR | Status: AC
  Filled 2022-05-19: qty 8

## 2022-05-20 MED ORDER — SODIUM CHLORIDE FLUSH 0.9 % IV SOLN
INTRAVENOUS | Status: AC
  Filled 2022-05-20: qty 10

## 2022-05-20 MED ORDER — PROPOFOL 1000 MG/100ML IV EMUL
INTRAVENOUS | Status: AC
  Filled 2022-05-20: qty 200

## 2022-06-03 ENCOUNTER — Telehealth (HOSPITAL_COMMUNITY): Payer: Self-pay | Admitting: Psychiatry

## 2022-06-03 NOTE — Telephone Encounter (Addendum)
D:  Patient called to inquire about being re-admitted to MH-IOP.  Pt was in MH-IOP seven yrs ago.  States Horatio Pel, NP referred her to MH-IOP.  Read pt's most recent CCA and notes 7 yrs ago; inquired if she was still having SI with plan?  Pt admits to continued SI with a plan (OD and cutting).  Discussed the appropriate level of care (ie. Inpatient hospitalization or PHP) with pt and safety options.  Pt declined both [facility]-inpatient and PHP at this time.  "I would prefer to start MH-IOP since Horatio Pel, NP ([facility]) referred me to you.  I have a safety plan with [place], so I know what I need to do."  Discussed safety options at length with pt and pt was able to contract for safety with the case manager also.  Pt is receptive with being stepped up to a higher level if ever needed.  A:  Provided pt with support.  Discussed pt with clinical manager Everlene Balls, RN.  He advised MH-IOP to meet pt where she is and check in with patient daily in order to assess if a higher level of care is needed.  Inform Hillery Jacks, NP and Noralee Stain, LCSW.  Re-oriented pt.  Pt will start on 06-07-22 @ 9 a.m.  R:  Pt receptive.

## 2022-06-07 ENCOUNTER — Other Ambulatory Visit (HOSPITAL_COMMUNITY): Payer: 59 | Attending: Psychiatry | Admitting: Psychiatry

## 2022-06-07 ENCOUNTER — Encounter (HOSPITAL_COMMUNITY): Payer: Self-pay | Admitting: Psychiatry

## 2022-06-07 DIAGNOSIS — Z9151 Personal history of suicidal behavior: Secondary | ICD-10-CM | POA: Insufficient documentation

## 2022-06-07 DIAGNOSIS — F4310 Post-traumatic stress disorder, unspecified: Secondary | ICD-10-CM | POA: Insufficient documentation

## 2022-06-07 DIAGNOSIS — Z79899 Other long term (current) drug therapy: Secondary | ICD-10-CM | POA: Insufficient documentation

## 2022-06-07 DIAGNOSIS — F332 Major depressive disorder, recurrent severe without psychotic features: Secondary | ICD-10-CM | POA: Insufficient documentation

## 2022-06-07 DIAGNOSIS — F411 Generalized anxiety disorder: Secondary | ICD-10-CM | POA: Diagnosis not present

## 2022-06-07 DIAGNOSIS — R45851 Suicidal ideations: Secondary | ICD-10-CM | POA: Insufficient documentation

## 2022-06-07 DIAGNOSIS — Z818 Family history of other mental and behavioral disorders: Secondary | ICD-10-CM | POA: Insufficient documentation

## 2022-06-07 NOTE — Progress Notes (Signed)
Virtual Visit via Video Note   I connected with [name] on 06/07/22 at  9:00 AM EDT by a video enabled telemedicine application and verified that I am speaking with the correct person using two identifiers.   At orientation to the IOP program, Case Manager discussed the limitations of evaluation and management by telemedicine and the availability of in person appointments. The patient expressed understanding and agreed to proceed with virtual visits throughout the duration of the program.   Location:  Patient: Patient Home Provider: [facility]   History of Present Illness: MDD   Observations/Objective: Check In: Case Manager checked in with all participants to review discharge dates, insurance authorizations, work-related documents and needs from the treatment team regarding medications. Natasha [name] stated needs and engaged in discussion.    Initial Therapeutic Activity: Counselor facilitated a check-in with Natasha [name] to assess for safety, sobriety and medication compliance.  Counselor also inquired about Natasha [name]'s current emotional ratings, as well as any significant changes in thoughts, feelings or behavior since previous check in.  Natasha [name] presented for session on time and was alert, oriented x5, with no evidence or self-report of active HI or A/V H.  Natasha [name] reported compliance with medication and denied use of alcohol or illicit substances.  Natasha [name] reported scores of 7/10 for depression, 7/10 for anxiety, and 0/10 for anger/irritability.  Natasha [name] denied any recent outbursts or panic attacks.  Natasha [name] reported that a recent success was making the decision to reenroll in school.  Natasha [name] reported that a recent struggle has been dealing with worsening depression symptoms which have interfered with her ability to accomplish academic goals.  Natasha [name] reported that she was experiencing SI with a plan late last night and struggling to find someone to speak with for support.  She reported that she  does have the suicide hotline number (988) and has contacted them before, stating "It does give you someone to speak with and they do listen".  She spoke with CM and counselor today about passive SI without intent or plan, and was able to contract for safety, but declined moving up to higher level of care for additional support (PHP).  Lyn reported that her goal today is to outreach her provider to discuss about ineffectiveness of medications.       Second Therapeutic Activity: Counselor covered topic of core beliefs with group today.  Counselor virtually shared a handout on the subject, which explained how everyone looks at the world differently, and two people can have the same experience, but have different interpretations of what happened.  Members were encouraged to think of these like sunglasses with different "shades" influencing perception towards positive or negative outcomes.  Examples of negative core beliefs were provided, such as "I'm unlovable", "I'm not good enough", and "I'm a bad person".  Members were asked to share which one(s) they could relate to, and then identify evidence which contradicts these beliefs.  Counselor also provided psychoeducation on positive affirmations today.  Counselor explained how these are positive statements which can be spoken out loud or recited mentally to challenge negative thoughts and/or core beliefs to improve mood and outlook each day.  Counselor shared a comprehensive list of affirmations virtually to members with different categories, including ones for health, confidence, success, and happiness.  Counselor invited members to look through this list and identify any which resonated with them, and practice saying them out loud with sincerity.  Intervention was effective, as evidenced by Natasha [name] successfully participating in discussion on the subject  and reporting that she could relate to several negative core beliefs listed on the handout, such as "I am a  failure", "I am unworthy", "I am weak" , and "I will end up alone".  Natasha [name] reported that these negative core beliefs have resulted in several consequences such as putting others needs above her own, low self-esteem and isolation.  Natasha [name] was able to successfully work on addressing these harmful core beliefs today by listing evidence which contradicts the belief "I will end up alone", noting that attending group today has shown that she is motivated to reduce isolation, make new connections, and find relatable peers that understand some of her same challenges.  Natasha [name] also reported that she liked several of the positive affirmations listed, such as "I matter and what I have to offer this world matters" and "I press on because I believe in my path".    Assessment and Plan: Counselor recommends that [name] remain in IOP treatment to better manage mental health symptoms, ensure stability and pursue completion of treatment plan goals. Counselor recommends adherence to crisis/safety plan, taking medications as prescribed, and following up with medical professionals if any issues arise.   Follow Up Instructions: Counselor will send Webex link for next session. [name] was advised to call back or seek an in-person evaluation if the symptoms worsen or if the condition fails to improve as anticipated.   Collaboration of Care:   Medication Management AEB Natasha Jacks, NP                                           Case Manager AEB Natasha Modena, CNA    Patient/Guardian was advised Release of Information must be obtained prior to any record release in order to collaborate their care with an outside provider. Patient/Guardian was advised if they have not already done so to contact the registration department to sign all necessary forms in order for Natasha [name] to release information regarding their care.   Consent: Patient/Guardian gives verbal consent for treatment and assignment of benefits for services provided during  this visit. Patient/Guardian expressed understanding and agreed to proceed.  I provided 180 minutes of non-face-to-face time during this encounter.   Noralee Stain, LCSW, LCAS 06/07/22

## 2022-06-08 ENCOUNTER — Other Ambulatory Visit (HOSPITAL_COMMUNITY): Payer: 59 | Admitting: Family

## 2022-06-08 DIAGNOSIS — T424X2A Poisoning by benzodiazepines, intentional self-harm, initial encounter: Secondary | ICD-10-CM | POA: Diagnosis not present

## 2022-06-08 DIAGNOSIS — T424X1A Poisoning by benzodiazepines, accidental (unintentional), initial encounter: Secondary | ICD-10-CM | POA: Diagnosis not present

## 2022-06-08 DIAGNOSIS — F332 Major depressive disorder, recurrent severe without psychotic features: Secondary | ICD-10-CM

## 2022-06-09 ENCOUNTER — Telehealth (HOSPITAL_COMMUNITY): Payer: Self-pay | Admitting: Psychiatry

## 2022-06-09 ENCOUNTER — Other Ambulatory Visit (HOSPITAL_COMMUNITY): Payer: 59 | Admitting: Family

## 2022-06-09 DIAGNOSIS — F332 Major depressive disorder, recurrent severe without psychotic features: Secondary | ICD-10-CM

## 2022-06-09 NOTE — Progress Notes (Signed)
Virtual Visit via Video Note   I connected with Ledia K. Vroom on 06/09/22 at  9:00 AM EDT by a video enabled telemedicine application and verified that I am speaking with the correct person using two identifiers.   At orientation to the IOP program, Case Manager discussed the limitations of evaluation and management by telemedicine and the availability of in person appointments. The patient expressed understanding and agreed to proceed with virtual visits throughout the duration of the program.   Location:  Patient: Patient Home Provider: [facility]   History of Present Illness: MDD   Observations/Objective: Check In: Case Manager checked in with all participants to review discharge dates, insurance authorizations, work-related documents and needs from the treatment team regarding medications. Leler stated needs and engaged in discussion.    Initial Therapeutic Activity: Counselor facilitated a check-in with Stormy to assess for safety, sobriety and medication compliance.  Counselor also inquired about Estela's current emotional ratings, as well as any significant changes in thoughts, feelings or behavior since previous check in.  Kayly presented for session on time and was alert, oriented x5, with no evidence or self-report of active HI or A/V H. Teonna reported that she is still experiencing daily SI and disclosed that she does have a plan involving taking expired medication, and although her doctor has recommended for her to get rid of these prescriptions, she was against this idea, stating "I'm not willing to do that yet".  Counselor encouraged Deisi to follow through with her doctor's recommendation to eliminate safety risk, and discussed safety planning measures with her, including contacting [phone] and/or 911 for crisis intervention should intent to harm herself develop.  Sholonda reported that she would follow safety plan and could contract for safety today.  Adin reported  compliance with medication and denied use of illicit substances.  Siren reported scores of 6/10 for depression, 7/10 for anxiety, and 0/10 for anger/irritability.  Blayre denied any recent outbursts or panic attacks.  Kelani reported that an additional struggle was falling twice in the past 24 hours, stating "I had to call EMS twice to help me get back up".  Clydia admitted that she had 3 alcoholic drinks last night which could have affected her balance.  Counselor encouraged her to abstain from further alcohol use due to the safety risk this could pose to both mental and physical wellbeing.  Joanette reported that she has family history of alcohol abuse, and acknowledged that it would be wise to avoid further use since this could lower her inhibitions and increase risky behavior.  Makaelyn continues to decline higher level of care at this time despite stating "This is definitely a step back from my progress".  Counselor will consult with care team Hillery Jacks, NP and Jeri Modena, CNA) regarding Tanicka's worsening condition to determine appropriate intervention.     Second Therapeutic Activity: Counselor introduced Peggye Fothergill, [employer] Pharmacist, to provide psychoeducation on topic of medication compliance with members today.  Michelle Nasuti provided psychoeducation on classes of medications such as antidepressants, antipsychotics, what symptoms they are intended to treat, and any side effects one might encounter while on a particular prescription.  Time was allowed for clients to ask any questions they might have of [facility] regarding this specialty.  Intervention was effective, as evidenced by [employer] participating in discussion with speaker on the subject, reporting that sometimes it feels like her behavioral medication may be numbing emotions, and she is uncertain whether this is intended, or a potential side effect.  Keymora was receptive to feedback from pharmacist on how her medication is intended to function, and  encouragement regarding importance of keeping psychiatrist updated on changes experienced.    Assessment and Plan: Counselor will consult with care team regarding current course of treatment.  Counselor recommends adherence to crisis/safety plan, taking medications as prescribed, and following up with medical professionals if any issues arise.   Follow Up Instructions: Luna was advised to call back or seek an in-person evaluation if the symptoms worsen or if the condition fails to improve as anticipated. Kalaya agreed to outreach [phone] and/or 911 for crisis intervention if necessary.     Collaboration of Care:   Medication Management AEB Hillery Jacks, NP                                           Case Manager AEB Jeri Modena, CNA    Patient/Guardian was advised Release of Information must be obtained prior to any record release in order to collaborate their care with an outside provider. Patient/Guardian was advised if they have not already done so to contact the registration department to sign all necessary forms in order for [place] to release information regarding their care.   Consent: Patient/Guardian gives verbal consent for treatment and assignment of benefits for services provided during this visit. Patient/Guardian expressed understanding and agreed to proceed.  I provided 180 minutes of non-face-to-face time during this encounter.   Noralee Stain, LCSW, LCAS 06/09/22

## 2022-06-09 NOTE — Telephone Encounter (Signed)
D:  Patient returned the case manager's call and left a vm.  Pt states she understands she needs a higher level of care, but continues to decline recommendation.  Pt stated that she understands MH-IOP's policy.  She states she understands all the recommendations (ie. Referrals for higher levels of care; such as [facility], PHP, [facility]).  Also, pt mentioned she contacted Horatio Pel, NP and will f/u on 07-13-22.  Patient provided verbal consent to speak to Horatio Pel, NP if needed.  Pt reported that she can contract for safety and her plan is to go to [facility] in case of an emergency because she is comfortable with Dr. Toni Amend; since she has seen him before for ECT.  A:  Case manager informed the treatment team along with Everlene Balls, RN (clinical mgr).

## 2022-06-09 NOTE — Telephone Encounter (Addendum)
D:  Placed call to pt to discuss discharge d/t patient needing a higher level of care d/t worsening presentation/condition today.  According to pt in group, she mentioned she fell twice yesterday after drinking three (ETOH) drinks.  Pt had to call EMS to help her up because she had fallen. Consulted with Everlene Balls, RN (manager), Hillery Jacks, NP, and Noralee Stain, LCSW.  Shawn agrees that pt needs a higher level of care and instructed the case manager to follow [employer] plan.  Hillery Jacks, NP requested that the case manager contact pt and discharge her, etc (ie. Instruct pt to go to Urgent Care/ [facility]; recommend [facility] or [place] for a higher level of care; encourage patient to see her outside provider Horatio Pel, NP) for f/u appt.  A:  Case manager placed call to [place] [facility]'s office to try and obtain a soon appt; but Diplomatic Services operational officer was unsure of how to schedule a phone consult appt.  States she will call the case manager back.  Placed call to discuss the discharge plan with pt, but there was no answer.  Left vm requesting pt to call the case manager back.  Inform Hillery Jacks, NP, Everlene Balls, RN and Noralee Stain, LCSW.  Will alert [facility].

## 2022-06-10 ENCOUNTER — Ambulatory Visit (HOSPITAL_COMMUNITY): Payer: 59

## 2022-06-10 ENCOUNTER — Inpatient Hospital Stay (HOSPITAL_COMMUNITY)
Admission: EM | Admit: 2022-06-10 | Discharge: 2022-06-15 | DRG: 917 | Disposition: A | Discharge status: Other Acute Inpatient Hospital | Payer: 59 | Attending: Internal Medicine | Admitting: Internal Medicine

## 2022-06-10 ENCOUNTER — Encounter (HOSPITAL_COMMUNITY): Payer: Self-pay

## 2022-06-10 ENCOUNTER — Other Ambulatory Visit: Payer: Self-pay

## 2022-06-10 DIAGNOSIS — G929 Unspecified toxic encephalopathy: Secondary | ICD-10-CM | POA: Diagnosis present

## 2022-06-10 DIAGNOSIS — T1491XA Suicide attempt, initial encounter: Secondary | ICD-10-CM

## 2022-06-10 DIAGNOSIS — E669 Obesity, unspecified: Secondary | ICD-10-CM | POA: Diagnosis present

## 2022-06-10 DIAGNOSIS — T424X1A Poisoning by benzodiazepines, accidental (unintentional), initial encounter: Secondary | ICD-10-CM | POA: Diagnosis present

## 2022-06-10 DIAGNOSIS — F4310 Post-traumatic stress disorder, unspecified: Secondary | ICD-10-CM | POA: Diagnosis present

## 2022-06-10 DIAGNOSIS — I959 Hypotension, unspecified: Secondary | ICD-10-CM | POA: Diagnosis present

## 2022-06-10 DIAGNOSIS — Z818 Family history of other mental and behavioral disorders: Secondary | ICD-10-CM

## 2022-06-10 DIAGNOSIS — Z87891 Personal history of nicotine dependence: Secondary | ICD-10-CM

## 2022-06-10 DIAGNOSIS — T424X2A Poisoning by benzodiazepines, intentional self-harm, initial encounter: Principal | ICD-10-CM | POA: Diagnosis present

## 2022-06-10 DIAGNOSIS — Z811 Family history of alcohol abuse and dependence: Secondary | ICD-10-CM

## 2022-06-10 DIAGNOSIS — Z6834 Body mass index (BMI) 34.0-34.9, adult: Secondary | ICD-10-CM

## 2022-06-10 DIAGNOSIS — G9340 Encephalopathy, unspecified: Secondary | ICD-10-CM

## 2022-06-10 DIAGNOSIS — Y92009 Unspecified place in unspecified non-institutional (private) residence as the place of occurrence of the external cause: Secondary | ICD-10-CM

## 2022-06-10 DIAGNOSIS — Z820 Family history of epilepsy and other diseases of the nervous system: Secondary | ICD-10-CM

## 2022-06-10 DIAGNOSIS — Z79899 Other long term (current) drug therapy: Secondary | ICD-10-CM

## 2022-06-10 DIAGNOSIS — T50902A Poisoning by unspecified drugs, medicaments and biological substances, intentional self-harm, initial encounter: Secondary | ICD-10-CM | POA: Diagnosis present

## 2022-06-10 DIAGNOSIS — D649 Anemia, unspecified: Secondary | ICD-10-CM | POA: Diagnosis present

## 2022-06-10 DIAGNOSIS — Z9151 Personal history of suicidal behavior: Secondary | ICD-10-CM

## 2022-06-10 DIAGNOSIS — Z791 Long term (current) use of non-steroidal anti-inflammatories (NSAID): Secondary | ICD-10-CM

## 2022-06-10 LAB — CBC WITH DIFFERENTIAL/PLATELET
Abs Immature Granulocytes: 0.02 10*3/uL (ref 0.00–0.07)
Basophils Absolute: 0.1 10*3/uL (ref 0.0–0.1)
Basophils Relative: 1 %
Eosinophils Absolute: 0.1 10*3/uL (ref 0.0–0.5)
Eosinophils Relative: 1 %
HCT: 36.7 % (ref 36.0–46.0)
Hemoglobin: 11.2 g/dL — ABNORMAL LOW (ref 12.0–15.0)
Immature Granulocytes: 0 %
Lymphocytes Relative: 17 %
Lymphs Abs: 1.3 10*3/uL (ref 0.7–4.0)
MCH: 25.2 pg — ABNORMAL LOW (ref 26.0–34.0)
MCHC: 30.5 g/dL (ref 30.0–36.0)
MCV: 82.5 fL (ref 80.0–100.0)
Monocytes Absolute: 0.5 10*3/uL (ref 0.1–1.0)
Monocytes Relative: 6 %
Neutro Abs: 5.5 10*3/uL (ref 1.7–7.7)
Neutrophils Relative %: 75 %
Platelets: 285 10*3/uL (ref 150–400)
RBC: 4.45 MIL/uL (ref 3.87–5.11)
RDW: 18.2 % — ABNORMAL HIGH (ref 11.5–15.5)
WBC: 7.5 10*3/uL (ref 4.0–10.5)
nRBC: 0 % (ref 0.0–0.2)

## 2022-06-10 LAB — COMPREHENSIVE METABOLIC PANEL
ALT: 24 U/L (ref 0–44)
AST: 23 U/L (ref 15–41)
Albumin: 3.2 g/dL — ABNORMAL LOW (ref 3.5–5.0)
Alkaline Phosphatase: 58 U/L (ref 38–126)
Anion gap: 6 (ref 5–15)
BUN: 16 mg/dL (ref 6–20)
CO2: 25 mmol/L (ref 22–32)
Calcium: 8.5 mg/dL — ABNORMAL LOW (ref 8.9–10.3)
Chloride: 107 mmol/L (ref 98–111)
Creatinine, Ser: 0.74 mg/dL (ref 0.44–1.00)
GFR, Estimated: >60 mL/min (ref >60)
Glucose, Bld: 134 mg/dL — ABNORMAL HIGH (ref 70–99)
Potassium: 4.2 mmol/L (ref 3.5–5.1)
Sodium: 138 mmol/L (ref 135–145)
Total Bilirubin: 0.4 mg/dL (ref 0.3–1.2)
Total Protein: 6.2 g/dL — ABNORMAL LOW (ref 6.5–8.1)

## 2022-06-10 LAB — BLOOD GAS, ARTERIAL
Acid-base deficit: 1.9 mmol/L (ref 0.0–2.0)
Bicarbonate: 23.7 mmol/L (ref 20.0–28.0)
O2 Saturation: 96.5 %
Patient temperature: 37
pCO2 arterial: 43 mmHg (ref 32–48)
pH, Arterial: 7.35 (ref 7.35–7.45)
pO2, Arterial: 83 mmHg (ref 83–108)

## 2022-06-10 LAB — SALICYLATE LEVEL: Salicylate Lvl: <7 mg/dL — ABNORMAL LOW (ref 7.0–30.0)

## 2022-06-10 LAB — HCG, QUANTITATIVE, PREGNANCY: hCG, Beta Chain, Quant, S: 3 m[IU]/mL (ref <5)

## 2022-06-10 LAB — ETHANOL: Alcohol, Ethyl (B): <10 mg/dL (ref <10)

## 2022-06-10 LAB — CBG MONITORING, ED: Glucose-Capillary: 134 mg/dL — ABNORMAL HIGH (ref 70–99)

## 2022-06-10 LAB — ACETAMINOPHEN LEVEL: Acetaminophen (Tylenol), Serum: <10 ug/mL — ABNORMAL LOW (ref 10–30)

## 2022-06-10 MED ORDER — ALBUTEROL SULFATE (2.5 MG/3ML) 0.083% IN NEBU: 2.5000 mg | INHALATION_SOLUTION | Freq: Four times a day (QID) | RESPIRATORY_TRACT | Status: DC | PRN

## 2022-06-10 MED ORDER — ENOXAPARIN SODIUM 40 MG/0.4ML IJ SOSY
40.0000 mg | PREFILLED_SYRINGE | INTRAMUSCULAR | Status: DC
  Administered 2022-06-10 – 2022-06-14 (×5): 40 mg via SUBCUTANEOUS
  Filled 2022-06-10 (×5): qty 0.4

## 2022-06-10 MED ORDER — BISACODYL 10 MG RE SUPP: 10.0000 mg | Freq: Every day | RECTAL | Status: DC | PRN

## 2022-06-10 MED ORDER — SODIUM CHLORIDE 0.9 % IV SOLN: INTRAVENOUS | Status: DC

## 2022-06-10 MED ORDER — LACTATED RINGERS IV BOLUS
1000.0000 mL | Freq: Once | INTRAVENOUS | Status: AC
  Administered 2022-06-10: 1000 mL via INTRAVENOUS

## 2022-06-10 MED ORDER — SODIUM CHLORIDE FLUSH 0.9 % IV SOLN
INTRAVENOUS | Status: AC
  Filled 2022-06-10: qty 20

## 2022-06-10 MED ORDER — LACTATED RINGERS IV SOLN: INTRAVENOUS | Status: DC

## 2022-06-10 MED ORDER — LACTATED RINGERS IV BOLUS
1000.0000 mL | Freq: Once | INTRAVENOUS | Status: AC
Start: 2022-06-10 — End: 2022-06-10
  Administered 2022-06-10: 1000 mL via INTRAVENOUS

## 2022-06-10 NOTE — H&P (Signed)
History and Physical    Patient: Natasha [name] SEG:[id number] DOB: 04-Mar-1971 DOA: 06/10/2022 DOS: the patient was seen and examined on 06/10/2022 PCP: Porfirio Oar, PA  Patient coming from: Home  Chief Complaint:  Chief Complaint  Patient presents with   Suicide Attempt   Ingestion   Hypotension   HPI: Natasha [name] is a 51 y.o. female with medical history significant of multiple suicide attempts, seizures presents to the emergency department after tensional benzodiazepine overdose.  Ultimately patient sent an email expressing depression and suicidal thoughts.  He was found unresponsive.  Per EMS there were empty bottles of Klonopin as well as propranolol.  Gradually became less responsive.  At bedside patient is arousable to sternal rub and mumbles when asked questions.  Per EMS patient received Narcan and a nasopharyngeal airway was placed at the field.  On arrival to the ER Poison control was called and recommended supportive care, IV fluids close monitoring of her hemodynamics.  On arrival to the ER patient was responsive to pain. Review of Systems: unable to review all systems due to the inability of the patient to answer questions. Past Medical History:  Diagnosis Date   Anxiety    C6 spinal cord injury (HCC)    Depression    OCD (obsessive compulsive disorder)    PTSD (post-traumatic stress disorder)    Past Surgical History:  Procedure Laterality Date   BACK SURGERY     NECK SURGERY     Social History:  reports that she has quit smoking. Her smoking use included cigarettes. She has never used smokeless tobacco. She reports current alcohol use. She reports that she does not use drugs.  No Known Allergies  Family History  Problem Relation Age of Onset   Alcohol abuse Father    Alcohol abuse Brother    Depression Mother    Alcohol abuse Mother    Parkinson's disease Mother     Prior to Admission medications   Medication Sig Start Date End Date  Taking? Authorizing Provider  Dextromethorphan-buPROPion ER (AUVELITY) 45-105 MG TBCR Take 1 tablet by mouth 2 (two) times daily.   Yes [provider]  baclofen (LIORESAL) 10 MG tablet Take 1 tablet (10 mg total) by mouth 3 (three) times daily. 02/03/22   Clapacs, Jackquline Denmark, MD  buPROPion (WELLBUTRIN XL) 150 MG 24 hr tablet Take 300 mg by mouth daily. 03/17/22   [provider]  busPIRone (BUSPAR) 10 MG tablet Take 1 tablet (10 mg total) by mouth 3 (three) times daily. 02/03/22   Clapacs, Jackquline Denmark, MD  clonazePAM (KLONOPIN) 0.5 MG tablet Take 0.5 mg by mouth 2 (two) times daily as needed for anxiety. 06/07/22   [provider]  diazepam (VALIUM) 5 MG tablet Take 5 mg by mouth every 6 (six) hours as needed for anxiety.    [provider]  DULoxetine (CYMBALTA) 60 MG capsule Take 2 capsules (120 mg total) by mouth daily. 02/04/22   Clapacs, Jackquline Denmark, MD  famotidine (PEPCID) 20 MG tablet Take 1 tablet (20 mg total) by mouth daily. 02/04/22   Clapacs, Jackquline Denmark, MD  hydrOXYzine (ATARAX) 50 MG tablet Take 1 tablet (50 mg total) by mouth every 6 (six) hours as needed for anxiety. 02/03/22   Clapacs, Jackquline Denmark, MD  meloxicam (MOBIC) 15 MG tablet Take 1 tablet (15 mg total) by mouth daily. 02/03/22   Clapacs, Jackquline Denmark, MD  propranolol (INDERAL) 20 MG tablet Take 20 mg by mouth 2 (two) times  daily. 05/03/22   [provider]  QUEtiapine (SEROQUEL) 100 MG tablet Take 1 tablet (100 mg total) by mouth at bedtime. 02/03/22   Clapacs, Jackquline Denmark, MD  SUMAtriptan (IMITREX) 100 MG tablet Take 1 tablet (100 mg total) by mouth every 2 (two) hours as needed for migraine. 02/03/22   Clapacs, Jackquline Denmark, MD  traZODone (DESYREL) 100 MG tablet Take 1 tablet (100 mg total) by mouth at bedtime as needed for sleep. 02/03/22   Clapacs, Jackquline Denmark, MD    Physical Exam:  Physical Exam Vitals and nursing note reviewed.  Constitutional:      General: She is not in acute distress.    Appearance: Normal appearance. She  is well-developed. She is not toxic-appearing.  HENT:     Head: Normocephalic and atraumatic.  Eyes:     General: Lids are normal.     Conjunctiva/sclera: Conjunctivae normal.     Pupils: Pupils are equal, round, and reactive to light.  Neck:     Thyroid: No thyroid mass.     Trachea: No tracheal deviation.  Cardiovascular:     Rate and Rhythm: Normal rate and regular rhythm.     Heart sounds: Normal heart sounds. No murmur heard.    No gallop.  Pulmonary:     Effort: Pulmonary effort is normal. No respiratory distress.     Breath sounds: Normal breath sounds. No stridor. No decreased breath sounds, wheezing, rhonchi or rales.  Abdominal:     General: There is no distension.     Palpations: Abdomen is soft.     Tenderness: There is no abdominal tenderness. There is no rebound.  Musculoskeletal:        General: No tenderness. Normal range of motion.     Cervical back: Normal range of motion and neck supple.  Skin:    General: Skin is warm and dry.     Findings: No abrasion or rash.  Neurological:     Mental Status: She is obtunded    GCS: GCS eye subscore is 1 GCS verbal subscore is 4 GCS motor subscore is 5    Cranial Nerves: No cranial nerve deficit.     Sensory: No sensory deficit.     Comments: Withdraws to pain in all 4 extremities  Psychiatric:        Attention and Perception:  Vitals:   06/10/22 1445 06/10/22 1500 06/10/22 1530 06/10/22 1600  BP: (!) 89/75 (!) 85/62 (!) 87/65 (!) 94/59  Pulse: (!) 52 (!) 56 (!) 56 (!) 56  Resp: 12 14 19 20   SpO2: 97% 96% 98% 100%    Data Reviewed:  There are no new results to review at this time.  Assessment and Plan: Intentional overdose of benzodiazepines.   -Patient attempting suicide with possible clonazepam, diazepam, quetiapine, propranolol (bottles found on scene) reportedly pt only admits to clonazepam.  Hold off on this with diazepam reversal due to patient's history of seizures. Continue IV fluids Monitoring  end-tidal CO2 Consult psychiatry One-on-one observation Aspiration, seizure and suicide precautions. Monitor hemodynamics Hold off on oral medications for now, complete NPO.   Advance Care Planning:   Code Status: Full Code   Consults:   Family Communication:     Author: , MD 06/10/2022 4:33 PM  For on call review [email or web address]/[email or web address].

## 2022-06-10 NOTE — ED Provider Notes (Addendum)
[facility]-EMERGENCY DEPT Provider Note   CSN: [id number] Arrival date & time: 06/10/22  1152     History  Chief Complaint  Patient presents with   Suicide Attempt   Ingestion   Hypotension    Natasha [name] is a 51 y.o. female.  51 year old female with history of depression and suicide attempt x3 in the past who presents after intentional ingestions of benzodiazepines.  Patient had sent an email expressing depression and suicidal thoughts according to EMS.  Please present for a well check and patient found unresponsive.  Per EMS, they found empty pill bottles of Klonopin as well as propranolol.  Patient initially was able to converse with them but then has become less responsive.  Patient's blood sugar above 100.  A nasopharyngeal airway was placed and patient transported here.       Home Medications Prior to Admission medications   Medication Sig Start Date End Date Taking? Authorizing Provider  Dextromethorphan-buPROPion ER (AUVELITY) 45-105 MG TBCR Take 1 tablet by mouth 2 (two) times daily.   Yes [provider]  baclofen (LIORESAL) 10 MG tablet Take 1 tablet (10 mg total) by mouth 3 (three) times daily. 02/03/22   Clapacs, Jackquline Denmark, MD  buPROPion (WELLBUTRIN XL) 150 MG 24 hr tablet Take 300 mg by mouth daily. 03/17/22   [provider]  busPIRone (BUSPAR) 10 MG tablet Take 1 tablet (10 mg total) by mouth 3 (three) times daily. 02/03/22   Clapacs, Jackquline Denmark, MD  clonazePAM (KLONOPIN) 0.5 MG tablet Take 0.5 mg by mouth 2 (two) times daily as needed for anxiety. 06/07/22   [provider]  diazepam (VALIUM) 5 MG tablet Take 5 mg by mouth every 6 (six) hours as needed for anxiety.    [provider]  DULoxetine (CYMBALTA) 60 MG capsule Take 2 capsules (120 mg total) by mouth daily. 02/04/22   Clapacs, Jackquline Denmark, MD  famotidine (PEPCID) 20 MG tablet Take 1 tablet (20 mg total) by mouth daily. 02/04/22   Clapacs, Jackquline Denmark, MD   hydrOXYzine (ATARAX) 50 MG tablet Take 1 tablet (50 mg total) by mouth every 6 (six) hours as needed for anxiety. 02/03/22   Clapacs, Jackquline Denmark, MD  meloxicam (MOBIC) 15 MG tablet Take 1 tablet (15 mg total) by mouth daily. 02/03/22   Clapacs, Jackquline Denmark, MD  propranolol (INDERAL) 20 MG tablet Take 20 mg by mouth 2 (two) times daily. 05/03/22   [provider]  QUEtiapine (SEROQUEL) 100 MG tablet Take 1 tablet (100 mg total) by mouth at bedtime. 02/03/22   Clapacs, Jackquline Denmark, MD  SUMAtriptan (IMITREX) 100 MG tablet Take 1 tablet (100 mg total) by mouth every 2 (two) hours as needed for migraine. 02/03/22   Clapacs, Jackquline Denmark, MD  traZODone (DESYREL) 100 MG tablet Take 1 tablet (100 mg total) by mouth at bedtime as needed for sleep. 02/03/22   Clapacs, Jackquline Denmark, MD      Allergies    Patient has no known allergies.    Review of Systems   Review of Systems  Unable to perform ROS: Patient unresponsive    Physical Exam Updated Vital Signs BP (!) 82/70   Pulse 70   SpO2 92%  Physical Exam Vitals and nursing note reviewed.  Constitutional:      General: She is not in acute distress.    Appearance: Normal appearance. She is well-developed. She is not toxic-appearing.  HENT:     Head: Normocephalic and atraumatic.  Eyes:     General: Lids are normal.     Conjunctiva/sclera: Conjunctivae normal.     Pupils: Pupils are equal, round, and reactive to light.  Neck:     Thyroid: No thyroid mass.     Trachea: No tracheal deviation.  Cardiovascular:     Rate and Rhythm: Normal rate and regular rhythm.     Heart sounds: Normal heart sounds. No murmur heard.    No gallop.  Pulmonary:     Effort: Pulmonary effort is normal. No respiratory distress.     Breath sounds: Normal breath sounds. No stridor. No decreased breath sounds, wheezing, rhonchi or rales.  Abdominal:     General: There is no distension.     Palpations: Abdomen is soft.     Tenderness: There is no abdominal tenderness. There is no  rebound.  Musculoskeletal:        General: No tenderness. Normal range of motion.     Cervical back: Normal range of motion and neck supple.  Skin:    General: Skin is warm and dry.     Findings: No abrasion or rash.  Neurological:     Mental Status: She is lethargic.     GCS: GCS eye subscore is 1. GCS verbal subscore is 4. GCS motor subscore is 5.     Cranial Nerves: No cranial nerve deficit.     Sensory: No sensory deficit.     Comments: Withdraws to pain in all 4 extremities  Psychiatric:        Attention and Perception: She is inattentive.     ED Results / Procedures / Treatments   Labs (all labs ordered are listed, but only abnormal results are displayed) Labs Reviewed  CBG MONITORING, ED - Abnormal; Notable for the following components:      Result Value   Glucose-Capillary 134 (*)    All other components within normal limits  CBC WITH DIFFERENTIAL/PLATELET  COMPREHENSIVE METABOLIC PANEL  RAPID URINE DRUG SCREEN, HOSP PERFORMED  ETHANOL  ACETAMINOPHEN LEVEL  SALICYLATE LEVEL  I-STAT BETA HCG BLOOD, ED ([facility], [facility], [facility] ONLY)  I-STAT ARTERIAL BLOOD GAS, ED    EKG EKG Interpretation  Date/Time:  Thursday June 10 2022 12:14:38 EDT Ventricular Rate:  66 PR Interval:  177 QRS Duration: 92 QT Interval:  437 QTC Calculation: 458 R Axis:   70 Text Interpretation: Sinus rhythm Confirmed by Lorre Nick ([id number]) on 06/10/2022 12:47:24 PM  Radiology No results found.  Procedures Procedures    Medications Ordered in ED Medications  lactated ringers infusion (has no administration in time range)  lactated ringers bolus 1,000 mL (has no administration in time range)    ED Course/ Medical Decision Making/ A&P                           Medical Decision Making Amount and/or Complexity of Data Reviewed Labs: ordered. ECG/medicine tests: ordered.  Risk Prescription drug management.  Patient's EKG per my interpretation shows no QT prolongation. Patient presented  after overdose of benzodiazepines.  Her end-tidal CO2 is in the mid 30s.  She does respond to deep stimulation with sternal rub.  She is protecting her airway at this time.  O2 sats remained appropriate.  Had some transient hypotension which responded well to IV fluids.  Discussed with poison control who recommends supportive care at this time.  Do not feel that patient needs to have airway management at this time.  ABG has  been ordered and is pending but with patient's end-tidal CO2 being appropriate do not feel that she needs intubation. Patient's ABG results noted and no evidence of hypercapnia. CRITICAL CARE Performed by: Toy Baker Total critical care time: 60 minutes Critical care time was exclusive of separately billable procedures and treating other patients. Critical care was necessary to treat or prevent imminent or life-threatening deterioration. Critical care was time spent personally by me on the following activities: development of treatment plan with patient and/or surrogate as well as nursing, discussions with consultants, evaluation of patient's response to treatment, examination of patient, obtaining history from patient or surrogate, ordering and performing treatments and interventions, ordering and review of laboratory studies, ordering and review of radiographic studies, pulse oximetry and re-evaluation of patient's condition.         Final Clinical Impression(s) / ED Diagnoses Final diagnoses:  None    Rx / DC Orders ED Discharge Orders     None         Lorre Nick, MD 06/10/22 1356    Lorre Nick, MD 06/10/22 1422

## 2022-06-10 NOTE — Progress Notes (Signed)
Received to room from ED via stretcher. Monitor applied. Pt remains ubtunded, but responds to voice. Sitter at bedside per order. VSS.

## 2022-06-10 NOTE — ED Notes (Signed)
Poison control notified. Recommend fluid for hypotension and supportive care. Obs for 6-8 hours for responsiveness.

## 2022-06-10 NOTE — Plan of Care (Signed)
  Problem: Education: Goal: Knowledge of General Education information will improve Description: Including pain rating scale, medication(s)/side effects and non-pharmacologic comfort measures Outcome: Not Met (add Reason)   Problem: Health Behavior/Discharge Planning: Goal: Ability to manage health-related needs will improve Outcome: Not Met (add Reason)   Problem: Health Behavior/Discharge Planning: Goal: Ability to manage health-related needs will improve Outcome: Not Met (add Reason)   Problem: Clinical Measurements: Goal: Ability to maintain clinical measurements within normal limits will improve Outcome: Not Met (add Reason) Goal: Will remain free from infection Outcome: Not Met (add Reason) Goal: Diagnostic test results will improve Outcome: Not Met (add Reason) Goal: Respiratory complications will improve Outcome: Not Met (add Reason) Goal: Cardiovascular complication will be avoided Outcome: Not Met (add Reason)   Problem: Activity: Goal: Risk for activity intolerance will decrease Outcome: Not Met (add Reason)   Problem: Nutrition: Goal: Adequate nutrition will be maintained Outcome: Not Met (add Reason)   Problem: Coping: Goal: Level of anxiety will decrease Outcome: Not Met (add Reason)   Problem: Elimination: Goal: Will not experience complications related to bowel motility Outcome: Not Met (add Reason) Goal: Will not experience complications related to urinary retention Outcome: Not Met (add Reason)   Problem: Pain Managment: Goal: General experience of comfort will improve Outcome: Not Met (add Reason)   Problem: Safety: Goal: Ability to remain free from injury will improve Outcome: Not Met (add Reason)   Problem: Skin Integrity: Goal: Risk for impaired skin integrity will decrease Outcome: Not Met (add Reason)

## 2022-06-10 NOTE — ED Triage Notes (Signed)
EMS reports from home, SI attempt with possible clonazepam, diazepam, quetiapine, propranolol (bottles found on scene) Pt only admits to clonazepam. Arrived responsive to pain. Pt Hx of SI attempts.   BP 95/70 HR 70 RR 16 Sp02 94 RA CBG 136  18ga LAC  0.5 Narcan admin on scene  300 ml NS enroute

## 2022-06-10 NOTE — Progress Notes (Signed)
Virtual Visit via Video Note  I connected with Natasha [name] on @TODAY @ at  9:00 AM EDT by a video enabled telemedicine application and verified that I am speaking with the correct person using two identifiers.  Location: Patient: at home Provider: at office   I discussed the limitations of evaluation and management by telemedicine and the availability of in person appointments. The patient expressed understanding and agreed to proceed.  I discussed the assessment and treatment plan with the patient. The patient was provided an opportunity to ask questions and all were answered. The patient agreed with the plan and demonstrated an understanding of the instructions.   The patient was advised to call back or seek an in-person evaluation if the symptoms worsen or if the condition fails to improve as anticipated.  I provided 30 minutes of non-face-to-face time during this encounter.   , RITA, M.Ed, CNA   Patient ID: Natasha [name], female   DOB: 09-05-1971, 51 y.o.   MRN: [id number] Discharge Entry Note:  As per most recent CCA states from 01-26-22:  "Natasha [name] is a 51 year old patient with a PMH dx of Major Depression. Pt presented to [facility] voluntarily, due to complaints of worsening SI with a plan. 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 Per chart review, pt. also expressed a plan to use her at-home scalpel to cut her wrist. Pt reported a previous suicide attempt in 2002 which lead to her having a spinal cord injury and paraplegia. Pt explained that she is in a major depressive episode and was referred by [facility] for ECT by Dr 2003. Pt reported that the referral was never responded to, and that the center advised her to present to the ED. Pt presented with increased psychomotor activity and was tremulous. It  was noted that the pt. was attentive and cooperative throughout the assessment. Pt identified her main stressors as her physical ailments in conjunction to her chronic depression and mental stress. Pt's protective factors are stable housing and having supportive friends. Thought processes were relevant, intact, and goal directed. Pt presented with a depressed mood and a responsive affect. Pt has dangerous judgement and lacking insight. Pt's BAL was 23 and UDS was positive for benzos and tricyclics. The patient denied current HI and AV/H."   Pt restarted in [facility] 06-07-22.  Pt is well known to this 10-24-1999 d/t a previous admit (July 2016) in [facility].  Patient states 01-06-1992, NP referred her to [facility].  Pt admits to worsening depressive symptoms, SI with plan (denies intent) and is able to contract for safety.  Discussed at length, higher levels of care (both inpt and PHP); pt declined both.  States she would like to try this level of care first, since she can contract for safety at this time.  Pt agrees that she is at her baseline.  "I am a purpose driven person and whenever I don't have that, that's when I am suicidal."  Pt states her impulse control has decreased since she's been more depressed lately.  "I'm not good with changes."  Pt admits she is in need of a therapist.  Pt prefers to have a CBT therapist.  Case manager strongly recommended pt having  one who specializes in DBT also.  Continued stressors:  1) Physical condition  2) Grief/loss (  mother died two yrs ago)  3) Limited support.  4) Awaiting to hear back from graduate school re: admission.  Pt denies HI or A/V hallucinations.  Pt has physical therapy, two days per week and is currently working with [facility]. Cc: patient's prior charts for hx  Patient was discharged yesterday (06-09-22) after only attending three days due to worsening symptoms.  Discussed discharge d/t patient needing a higher level of care d/t worsening  presentation/condition yesterday.  According to pt in group, she mentioned she fell twice yesterday after drinking three (ETOH) drinks.  Pt had to call EMS to help her up because she had fallen.  Pt also admits to stockpiling old medications.  These were medications that her NP [place]) had told her to throw away. Consulted with Everlene Balls, RN (manager), Hillery Jacks, NP, and Noralee Stain, LCSW.  Shawn agreed that pt needed a higher level of care and instructed the case manager to follow [facility] plan.  Hillery Jacks, NP requested that the case manager contact pt and discharge her, etc (ie. Instruct pt to go to Urgent Care/ [facility]; recommend [facility] or [facility] for a higher level of care; encourage patient to see her outside provider Horatio Pel, NP) for f/u appt.  Pt stated she understood she needed a higher level of care, but continues to decline recommendations.  Pt stated that she understands [facility]'s policy.  She stated she understood all the recommendations (ie. Referrals for higher levels of care; such as [facility], [facility], [facility]).  Also, pt mentioned she contacted Horatio Pel, NP and will f/u on 07-13-22.  Patient provided verbal consent to speak to Horatio Pel, NP if needed.  Pt reported that she can contract for safety and her plan is to go to [facility] in case of an emergency because she is comfortable with Dr. Toni Amend; since she has seen him before for ECT.  A:  Discharged patient.  Provided pt with support.  Discussed safety options at length with pt.  Pt declined higher levels of care.  Able to contract for safety. Pt was advised of ROI must be obtained prior to any records release in order to collaborate her care with an outside provider.  Pt was advised if she has not already done so to contact the front desk to sign all necessary forms in order for [facility] to release info re: her care.    Pt was advised of ROI must be obtained prior  to any records release in order to collaborate her care with an outside provider.  Pt was advised if she has not already done so to contact the front desk to sign all necessary forms in order for [facility] to release info re: her care.  Consent:  Pt gives verbal consent for tx and assignment of benefits for services provided during this telehealth group process.  Pt provided the case manager with verbal consent to speak to Horatio Pel, NP.  Pt expressed understanding and agreed to proceed. Collaboration of care:  Collaborate with Hillery Jacks, NP AEB and Noralee Stain, LCSW AEB, Horatio Pel, NP,AEB, and will refer pt to a DBT/CBT therapist (provided pt with various names and phone numbers). Strongly encouraged support groups; especially DBT.  R:  Pt receptive.  Jeri Modena, M.Ed,CNA

## 2022-06-11 ENCOUNTER — Encounter (HOSPITAL_COMMUNITY): Payer: Self-pay | Admitting: Family

## 2022-06-11 ENCOUNTER — Ambulatory Visit (HOSPITAL_COMMUNITY): Payer: 59

## 2022-06-11 DIAGNOSIS — T50902A Poisoning by unspecified drugs, medicaments and biological substances, intentional self-harm, initial encounter: Secondary | ICD-10-CM | POA: Diagnosis not present

## 2022-06-11 DIAGNOSIS — Z9151 Personal history of suicidal behavior: Secondary | ICD-10-CM | POA: Diagnosis not present

## 2022-06-11 DIAGNOSIS — F4310 Post-traumatic stress disorder, unspecified: Secondary | ICD-10-CM | POA: Diagnosis present

## 2022-06-11 DIAGNOSIS — Z87891 Personal history of nicotine dependence: Secondary | ICD-10-CM | POA: Diagnosis not present

## 2022-06-11 DIAGNOSIS — G9340 Encephalopathy, unspecified: Secondary | ICD-10-CM

## 2022-06-11 DIAGNOSIS — E669 Obesity, unspecified: Secondary | ICD-10-CM | POA: Diagnosis present

## 2022-06-11 DIAGNOSIS — I959 Hypotension, unspecified: Secondary | ICD-10-CM | POA: Diagnosis present

## 2022-06-11 DIAGNOSIS — Z811 Family history of alcohol abuse and dependence: Secondary | ICD-10-CM | POA: Diagnosis not present

## 2022-06-11 DIAGNOSIS — Z791 Long term (current) use of non-steroidal anti-inflammatories (NSAID): Secondary | ICD-10-CM | POA: Diagnosis not present

## 2022-06-11 DIAGNOSIS — T424X2A Poisoning by benzodiazepines, intentional self-harm, initial encounter: Secondary | ICD-10-CM | POA: Diagnosis present

## 2022-06-11 DIAGNOSIS — D649 Anemia, unspecified: Secondary | ICD-10-CM | POA: Diagnosis present

## 2022-06-11 DIAGNOSIS — T424X1A Poisoning by benzodiazepines, accidental (unintentional), initial encounter: Secondary | ICD-10-CM | POA: Diagnosis present

## 2022-06-11 DIAGNOSIS — Y92009 Unspecified place in unspecified non-institutional (private) residence as the place of occurrence of the external cause: Secondary | ICD-10-CM | POA: Diagnosis not present

## 2022-06-11 DIAGNOSIS — Z818 Family history of other mental and behavioral disorders: Secondary | ICD-10-CM | POA: Diagnosis not present

## 2022-06-11 DIAGNOSIS — Z820 Family history of epilepsy and other diseases of the nervous system: Secondary | ICD-10-CM | POA: Diagnosis not present

## 2022-06-11 DIAGNOSIS — G929 Unspecified toxic encephalopathy: Secondary | ICD-10-CM | POA: Diagnosis present

## 2022-06-11 DIAGNOSIS — T1491XA Suicide attempt, initial encounter: Secondary | ICD-10-CM

## 2022-06-11 DIAGNOSIS — Z79899 Other long term (current) drug therapy: Secondary | ICD-10-CM | POA: Diagnosis not present

## 2022-06-11 DIAGNOSIS — Z6834 Body mass index (BMI) 34.0-34.9, adult: Secondary | ICD-10-CM | POA: Diagnosis not present

## 2022-06-11 LAB — CBC
HCT: 35.5 % — ABNORMAL LOW (ref 36.0–46.0)
Hemoglobin: 10.9 g/dL — ABNORMAL LOW (ref 12.0–15.0)
MCH: 25.7 pg — ABNORMAL LOW (ref 26.0–34.0)
MCHC: 30.7 g/dL (ref 30.0–36.0)
MCV: 83.7 fL (ref 80.0–100.0)
Platelets: 244 10*3/uL (ref 150–400)
RBC: 4.24 MIL/uL (ref 3.87–5.11)
RDW: 18.3 % — ABNORMAL HIGH (ref 11.5–15.5)
WBC: 8.4 10*3/uL (ref 4.0–10.5)
nRBC: 0 % (ref 0.0–0.2)

## 2022-06-11 LAB — COMPREHENSIVE METABOLIC PANEL
ALT: 26 U/L (ref 0–44)
AST: 40 U/L (ref 15–41)
Albumin: 3.2 g/dL — ABNORMAL LOW (ref 3.5–5.0)
Alkaline Phosphatase: 58 U/L (ref 38–126)
Anion gap: 5 (ref 5–15)
BUN: 17 mg/dL (ref 6–20)
CO2: 22 mmol/L (ref 22–32)
Calcium: 8.5 mg/dL — ABNORMAL LOW (ref 8.9–10.3)
Chloride: 113 mmol/L — ABNORMAL HIGH (ref 98–111)
Creatinine, Ser: 0.77 mg/dL (ref 0.44–1.00)
GFR, Estimated: >60 mL/min (ref >60)
Glucose, Bld: 81 mg/dL (ref 70–99)
Potassium: 4.2 mmol/L (ref 3.5–5.1)
Sodium: 140 mmol/L (ref 135–145)
Total Bilirubin: 0.5 mg/dL (ref 0.3–1.2)
Total Protein: 6.2 g/dL — ABNORMAL LOW (ref 6.5–8.1)

## 2022-06-11 MED ORDER — ORAL CARE MOUTH RINSE: 15.0000 mL | OROMUCOSAL | Status: DC | PRN

## 2022-06-11 MED ORDER — HALOPERIDOL LACTATE 5 MG/ML IJ SOLN
2.0000 mg | Freq: Four times a day (QID) | INTRAMUSCULAR | Status: DC | PRN
  Administered 2022-06-12 – 2022-06-14 (×3): 5 mg via INTRAVENOUS
  Filled 2022-06-11 (×3): qty 1

## 2022-06-11 MED ORDER — DIAZEPAM 5 MG PO TABS
5.0000 mg | ORAL_TABLET | Freq: Three times a day (TID) | ORAL | Status: DC
  Administered 2022-06-12 – 2022-06-15 (×10): 5 mg via ORAL
  Filled 2022-06-11 (×10): qty 1

## 2022-06-11 MED ORDER — HALOPERIDOL LACTATE 5 MG/ML IJ SOLN
2.0000 mg | Freq: Four times a day (QID) | INTRAMUSCULAR | Status: DC | PRN
  Administered 2022-06-11: 2 mg via INTRAVENOUS
  Filled 2022-06-11: qty 1

## 2022-06-11 MED ORDER — LORAZEPAM 2 MG/ML IJ SOLN
1.0000 mg | INTRAMUSCULAR | Status: DC | PRN
  Administered 2022-06-12 – 2022-06-14 (×2): 1 mg via INTRAVENOUS
  Filled 2022-06-11 (×2): qty 1

## 2022-06-11 MED ORDER — DIAZEPAM 5 MG PO TABS: 5.0000 mg | ORAL_TABLET | Freq: Three times a day (TID) | ORAL | Status: DC | PRN

## 2022-06-11 NOTE — Consult Note (Signed)
[name] Face-to-Face Psychiatry Consult   Reason for Consult:   Referring Physician: Dr. Hanley Ben Patient Identification: Natasha [name] MRN:  [id number] Principal Diagnosis: Benzodiazepine overdose Diagnosis:  Principal Problem:   Benzodiazepine overdose Active Problems:   Suicidal behavior with attempted self-injury (HCC)   Acute encephalopathy   Total Time spent with patient: 1 hour  Subjective: "I took Propranolol, Valium, Klonopin, and Seroquel. They asked me to be open and honest. I told them I had a plan and no intent. I struggle a lot with being alone and isolated, I have very few friends. I rely on being accomplished some things in life. I felt rejected, it stung home pretty hard after he told me I couldn't participate in the triathlon for disable patients. It was raining and he wouldn't let me do it with a walker and wheelchair. "   Natasha [name] is a 51 y.o. female patient presented to [facility] ED via EMS after suicide attempt by overdose.  The patient is here following a suicide attempt by overdose on most of her medication, after being dismissed from her intensive outpatient program through [facility] behavioral health outpatient.  Patient has had approximately 7 emergency room visits/admissions due to depression and or self-harm injuries that result in medical assistance.  Patient also completed ECT therapy under the services of Dr. Toni Amend at [facility].  Patient has history of multiple suicide attempts, all of high lethality.  Patient suffered a spinal cord injury after jumping from a building.   Patient was receiving outpatient Psychiatric services through [facility] behavioral health outpatient most recently.  Prior to this psychiatric provider she was under the care of [facility]. She has attempted suicide 4 times (jump, (2) OD on Tylenol and hydroxyzine, hanging). She denies any illicit substance use or alcohol use at this time. UDS not obtained on admission.   The  patient was seen face-to-face by this provider; the chart was reviewed and consulted with Dr. Gasper Sells on 06/11/22  due to the patient's care. It was discussed with the Hospitalist that the patient does meet the criteria to be admitted to the psychiatric inpatient unit.  On evaluation, the patient was asleep and did arouse to name calling. She was alert and oriented x2, calm, depressed, cooperative, and mood-congruent with affect.  Patient is observed with increased circumstantiality, emotional, and behavioral dysregulation.  Patient with a history of multiple suicide attempts that have very high lethality.  Patient also with poor control ability, inability to keep self safe due to emotional dysregulation.  Patient's states most of her suicide attempts have been as a result of abandonment, neglect, rejection.  Patient's last hospitalization was in February 2023 following recent suicide attempt and increase in self-harm in which she received ECT under the care of Dr. Toni Amend.  Most recently she was compliant with her medication, and denies suicidal intent 1 days prior to her suicide attempt.  Patient was admitted to the emergency room on June 29 following suicide attempt by overdose.  Pill bottles are still in the pharmacy 4 of 5 are empty.    Patient behaviors are concerning for borderline personality disorder due to chronic suicide attempts, gestures, multiple failed attempts, impulsive behaviors, intense and unstable relationships within her support system.  Patient has demonstrated inability to keep self safe despite being compliant with treatment, and continues to show concerns regarding feeling abandoned.  The patient does not appear to be responding to internal or external stimuli. Neither is the patient presenting with any delusional thinking.  The patient denies auditory or visual hallucinations. The patient admits to suicidal attempt by overdose. She denies any suicidal ideations, homicidal ideations  and or hallucinations. The patient is not presenting with any psychotic or paranoid behaviors.  At this time it does appear that patient could benefit from intensive outpatient therapy in the form of dialectical behavioral therapy, for cluster B personality/borderline; however due to circumstances will need acute hospitalization for suicidal behaviors.  Patient may also benefit from long-term hospitalization and or skill services due to inability to keep self safe consider guardianship.   HPI: Natasha [name] is a 51 y.o. female  with a history of anxiety, depression, OCD, PTSD who presents from home voluntarily for suicidal thoughts.  Patient has a history of receiving ECT approximately 4 months ago with Dr. Toni Amendlapacs, for severe treatment resistant depression.  At that time patient had suicidal thoughts to kill herself with cyanide.  Patient reports decreased oral intake.  She denies history of alcohol withdrawal or dependence.  She denies any other drug use.  Patient denies any medical complaints.     Past Psychiatric History:  OCD (obsessive compulsive disorder)   PTSD (post-traumatic stress disorder) Anxiety Major Depression Disorder   Risk to Self:   Risk to Others:   Prior Inpatient Therapy:   Prior Outpatient Therapy:    Past Medical History:  Past Medical History:  Diagnosis Date   Anxiety    C6 spinal cord injury (HCC)    Depression    OCD (obsessive compulsive disorder)    PTSD (post-traumatic stress disorder)     Past Surgical History:  Procedure Laterality Date   BACK SURGERY     NECK SURGERY     Family History:  Family History  Problem Relation Age of Onset   Alcohol abuse Father    Alcohol abuse Brother    Depression Mother    Alcohol abuse Mother    Parkinson's disease Mother    Family Psychiatric  History: Brother, Gearldine ShownGrandmother and great grandfather all suffered from addiction.  Social History:  Social History   Substance and Sexual Activity   Alcohol Use Yes   Alcohol/week: 0.0 standard drinks of alcohol   Comment: Occasional use     Social History   Substance and Sexual Activity  Drug Use No    Social History   Socioeconomic History   Marital status: Single    Spouse name: Not on file   Number of children: Not on file   Years of education: Not on file   Highest education level: Not on file  Occupational History   Occupation: Disability  Tobacco Use   Smoking status: Former    Packs/day: 0.00    Types: Cigarettes   Smokeless tobacco: Never  Vaping Use   Vaping Use: Never used  Substance and Sexual Activity   Alcohol use: Yes    Alcohol/week: 0.0 standard drinks of alcohol    Comment: Occasional use   Drug use: No   Sexual activity: Not Currently  Other Topics Concern   Not on file  Social History Narrative   Pt lives alone; receives outpatient psychiatry services through [facility].  Pt is on disability due to a spinal cord injury.    Social Determinants of Health   Financial Resource Strain: Not on file  Food Insecurity: Not on file  Transportation Needs: Not on file  Physical Activity: Not on file  Stress: Not on file  Social Connections: Not on file   Additional Social History:  Allergies:  No Known Allergies  Labs:  Results for orders placed or performed during the hospital encounter of 06/10/22 (from the past 48 hour(s))  CBG monitoring, ED     Status: Abnormal   Collection Time: 06/10/22 11:59 AM  Result Value Ref Range   Glucose-Capillary 134 (H) 70 - 99 mg/dL    Comment: Glucose reference range applies only to samples taken after fasting for at least 8 hours.  CBC with Differential/Platelet     Status: Abnormal   Collection Time: 06/10/22 12:08 PM  Result Value Ref Range   WBC 7.5 4.0 - 10.5 K/uL   RBC 4.45 3.87 - 5.11 MIL/uL   Hemoglobin 11.2 (L) 12.0 - 15.0 g/dL   HCT 38.1 01.7 - 51.0 %   MCV 82.5 80.0 - 100.0 fL   MCH 25.2 (L) 26.0 - 34.0 pg   MCHC 30.5 30.0 - 36.0  g/dL   RDW 25.8 (H) 52.7 - 78.2 %   Platelets 285 150 - 400 K/uL   nRBC 0.0 0.0 - 0.2 %   Neutrophils Relative % 75 %   Neutro Abs 5.5 1.7 - 7.7 K/uL   Lymphocytes Relative 17 %   Lymphs Abs 1.3 0.7 - 4.0 K/uL   Monocytes Relative 6 %   Monocytes Absolute 0.5 0.1 - 1.0 K/uL   Eosinophils Relative 1 %   Eosinophils Absolute 0.1 0.0 - 0.5 K/uL   Basophils Relative 1 %   Basophils Absolute 0.1 0.0 - 0.1 K/uL   Immature Granulocytes 0 %   Abs Immature Granulocytes 0.02 0.00 - 0.07 K/uL    Comment: Performed at [facility], [address], [place], [place] [address]  Comprehensive metabolic panel     Status: Abnormal   Collection Time: 06/10/22 12:08 PM  Result Value Ref Range   Sodium 138 135 - 145 mmol/L   Potassium 4.2 3.5 - 5.1 mmol/L   Chloride 107 98 - 111 mmol/L   CO2 25 22 - 32 mmol/L   Glucose, Bld 134 (H) 70 - 99 mg/dL    Comment: Glucose reference range applies only to samples taken after fasting for at least 8 hours.   BUN 16 6 - 20 mg/dL   Creatinine, Ser 6.14 0.44 - 1.00 mg/dL   Calcium 8.5 (L) 8.9 - 10.3 mg/dL   Total Protein 6.2 (L) 6.5 - 8.1 g/dL   Albumin 3.2 (L) 3.5 - 5.0 g/dL   AST 23 15 - 41 U/L   ALT 24 0 - 44 U/L   Alkaline Phosphatase 58 38 - 126 U/L   Total Bilirubin 0.4 0.3 - 1.2 mg/dL   GFR, Estimated >43 >15 mL/min    Comment: (NOTE) Calculated using the CKD-EPI Creatinine Equation (2021)    Anion gap 6 5 - 15    Comment: Performed at [facility], [address] [place], [place] [address]  Ethanol     Status: None   Collection Time: 06/10/22 12:08 PM  Result Value Ref Range   Alcohol, Ethyl (B) <10 <10 mg/dL    Comment: (NOTE) Lowest detectable limit for serum alcohol is 10 mg/dL.  For medical purposes only. Performed at [facility], [address], [place], [place] [address]   Acetaminophen level     Status: Abnormal   Collection Time: 06/10/22 12:08 PM  Result Value Ref Range    Acetaminophen (Tylenol), Serum <10 (L) 10 - 30 ug/mL    Comment: (NOTE) Therapeutic concentrations vary significantly.  A range of 10-30 ug/mL  may be an effective concentration for many patients. However, some  are best treated at concentrations outside of this range. Acetaminophen concentrations >150 ug/mL at 4 hours after ingestion  and >50 ug/mL at 12 hours after ingestion are often associated with  toxic reactions.  Performed at [facility], [address] [place], [place] [address]   Salicylate level     Status: Abnormal   Collection Time: 06/10/22 12:08 PM  Result Value Ref Range   Salicylate Lvl <7.0 (L) 7.0 - 30.0 mg/dL    Comment: Performed at [facility], [address], [place], [place] [address]  hCG, quantitative, pregnancy     Status: None   Collection Time: 06/10/22 12:18 PM  Result Value Ref Range   hCG, Beta Chain, Quant, S 3 <5 mIU/mL    Comment:          GEST. AGE      CONC.  (mIU/mL)   <=1 WEEK        5 - 50     2 WEEKS       50 - 500     3 WEEKS       100 - 10,000     4 WEEKS     1,000 - 30,000     5 WEEKS     3,500 - 115,000   6-8 WEEKS     12,000 - 270,000    12 WEEKS     15,000 - 220,000        FEMALE AND NON-PREGNANT FEMALE:     LESS THAN 5 mIU/mL Performed at [facility], [address], [place], [place] [address]   Blood gas, arterial (at [facility])     Status: None   Collection Time: 06/10/22  1:56 PM  Result Value Ref Range   pH, Arterial 7.35 7.35 - 7.45   pCO2 arterial 43 32 - 48 mmHg   pO2, Arterial 83 83 - 108 mmHg   Bicarbonate 23.7 20.0 - 28.0 mmol/L   Acid-base deficit 1.9 0.0 - 2.0 mmol/L   O2 Saturation 96.5 %   Patient temperature 37.0    Allens test (pass/fail) PASS PASS    Comment: Performed at [facility], [address], [place], [place] [address]  Comprehensive metabolic panel     Status: Abnormal   Collection Time: 06/11/22  5:50 AM  Result Value  Ref Range   Sodium 140 135 - 145 mmol/L   Potassium 4.2 3.5 - 5.1 mmol/L   Chloride 113 (H) 98 - 111 mmol/L   CO2 22 22 - 32 mmol/L   Glucose, Bld 81 70 - 99 mg/dL    Comment: Glucose reference range applies only to samples taken after fasting for at least 8 hours.   BUN 17 6 - 20 mg/dL   Creatinine, Ser 2.95 0.44 - 1.00 mg/dL   Calcium 8.5 (L) 8.9 - 10.3 mg/dL   Total Protein 6.2 (L) 6.5 - 8.1 g/dL   Albumin 3.2 (L) 3.5 - 5.0 g/dL   AST 40 15 - 41 U/L   ALT 26 0 - 44 U/L   Alkaline Phosphatase 58 38 - 126 U/L   Total Bilirubin 0.5 0.3 - 1.2 mg/dL   GFR, Estimated >62 >13 mL/min    Comment: (NOTE) Calculated using the CKD-EPI Creatinine Equation (2021)    Anion gap 5 5 - 15    Comment: Performed at [facility], [address] [place], [place] [address]  CBC     Status: Abnormal   Collection Time: 06/11/22  9:23 AM  Result Value Ref Range   WBC 8.4 4.0 - 10.5 K/uL   RBC 4.24 3.87 - 5.11 MIL/uL   Hemoglobin 10.9 (L) 12.0 - 15.0 g/dL   HCT 60.4 (L) 54.0 - 98.1 %   MCV 83.7 80.0 - 100.0 fL   MCH 25.7 (L) 26.0 - 34.0 pg   MCHC 30.7 30.0 - 36.0 g/dL   RDW 19.1 (H) 47.8 - 29.5 %   Platelets 244 150 - 400 K/uL   nRBC 0.0 0.0 - 0.2 %    Comment: Performed at [facility], [address], [place], [place] [address]    Current Facility-Administered Medications  Medication Dose Route Frequency Provider Last Rate Last Admin   0.9 %  sodium chloride infusion   Intravenous Continuous Dibia, Elgie Collard, MD 75 mL/hr at 06/11/22 0918 New Bag at 06/11/22 0918   albuterol (PROVENTIL) (2.5 MG/3ML) 0.083% nebulizer solution 2.5 mg  2.5 mg Nebulization Q6H PRN Dibia, Elgie Collard, MD       bisacodyl (DULCOLAX) suppository 10 mg  10 mg Rectal Daily PRN Dibia, Elgie Collard, MD       enoxaparin (LOVENOX) injection 40 mg  40 mg Subcutaneous Q24H Dibia, Elgie Collard, MD   40 mg at 06/10/22 2236   haloperidol lactate (HALDOL) injection 2-5 mg  2-5 mg Intravenous Q6H PRN  Glade Lloyd, MD       lactated ringers infusion   Intravenous Continuous Dibia, Elgie Collard, MD   Stopped at 06/10/22 1724   LORazepam (ATIVAN) injection 1 mg  1 mg Intravenous Q4H PRN Glade Lloyd, MD        Musculoskeletal: Strength & Muscle Tone: flaccid, abnormal, and decreased Gait & Station: unsteady, ataxic Patient leans: Right, Left, and Backward  Psychiatric Specialty Exam:  Presentation  General Appearance: Appropriate for Environment  Eye Contact:Good  Speech:Clear and Coherent  Speech Volume:Normal  Handedness:Right   Mood and Affect  Mood:Depressed  Affect:Blunt; Congruent   Thought Process  Thought Processes:Coherent  Descriptions of Associations:Intact  Orientation:Full (Time, Place and Person)  Thought Content:Logical  History of Schizophrenia/Schizoaffective disorder:No Duration of Psychotic Symptoms:No data recorded Hallucinations:No data recorded  Ideas of Reference:None  Suicidal Thoughts:No data recorded  Homicidal Thoughts:No data recorded   Sensorium  Memory:Immediate Good; Recent Good; Remote Good  Judgment:Fair  Insight:Poor   Executive Functions  Concentration:Good  Attention Span:Good  Recall:Good  Fund of Knowledge:Good  Language:Good   Psychomotor Activity  Psychomotor Activity:No data recorded   Assets  Assets:Communication Skills; Physical Health; Resilience; Social Support; Vocational/Educational   Sleep  Sleep:No data recorded   Physical Exam: Physical Exam Vitals and nursing note reviewed.  Constitutional:      Appearance: Normal appearance.  HENT:     Head: Normocephalic and atraumatic.     Right Ear: External ear normal.     Left Ear: External ear normal.     Nose: Nose normal.     Mouth/Throat:     Mouth: Mucous membranes are dry.  Eyes:     Conjunctiva/sclera: Conjunctivae normal.  Cardiovascular:     Rate and Rhythm: Normal rate and regular rhythm.     Pulses: Normal pulses.   Pulmonary:     Effort: Pulmonary effort is normal.  Musculoskeletal:        General: Normal range of motion.     Cervical back:  Normal range of motion and neck supple.  Neurological:     General: No focal deficit present.     Mental Status: She is alert and oriented to person, place, and time.  Psychiatric:        Attention and Perception: Attention and perception normal.        Mood and Affect: Mood is depressed. Affect is blunt and flat.        Speech: Speech normal.        Behavior: Behavior is cooperative.        Thought Content: Thought content includes suicidal ideation. Thought content includes suicidal plan.        Cognition and Memory: Cognition and memory normal.        Judgment: Judgment is impulsive and inappropriate.    Review of Systems  Psychiatric/Behavioral:  Positive for depression and suicidal ideas.   All other systems reviewed and are negative.  Blood pressure (!) 111/93, pulse 69, temperature 98.2 F (36.8 C), temperature source Oral, resp. rate 20, SpO2 100 %. There is no height or weight on file to calculate BMI.  Treatment Plan Summary: Medication management and Plan Patient does meet criteria for psychiatric inpatient admission -Prescriptions confirmed through PDMP shows patient has 2 valid prescriptions for Klonopin 0.5 mg p.o. twice daily and diazepam 5 mg p.o. twice daily, both of which she has been taking for the past 2 years.  Due to most recent overdose, and considering both are central nervous system agents and long acting will only restart Valium.  -We will need to notify prescribing providers most recent overdose, and need to consider benzodiazepine taper for patient safety.                -Patient currently understands she requires admission to inpatient psychiatric hospital due to high lethality of most recent suicide attempt.  In the event patient attempts to leave will need to be placed under involuntary commitment at that time. -Due to  unpredictable behaviors and behavioral dysregulation, will recommend one-to-one safety sitter. -Consider [facility] consult for referral for dialectical behavioral therapy.   Disposition: Recommend psychiatric Inpatient admission when medically cleared. Supportive therapy provided about ongoing stressors.  Maryagnes Amos, FNP 06/11/2022 3:09 PM

## 2022-06-11 NOTE — Progress Notes (Signed)
PROGRESS NOTE    Natasha [name]  URK:[id number] DOB: 1971/07/03 DOA: 06/10/2022 PCP: Porfirio Oar, PA   Brief Narrative:  51 y.o. female with medical history significant of multiple suicide attempts, seizures presented with suicidal attempt after intentional benzodiazepine overdose.  EMS found her with empty bottles of Klonopin and propranolol; she received Narcan prior to EMS.  In the ED, she was maintaining her airway but was almost obtunded.  Poison control recommended supportive care, IV fluids and close monitoring of her hemodynamics.    Assessment & Plan:   Intentional overdose of benzodiazepines Suicidal attempt Acute toxic encephalopathy -Patient presented with probable intentional overdose of benzodiazepines in a suicidal attempt and was almost obtunded on presentation.  She was maintaining airway. -Monitor mental status.  Mental status slightly improving this morning and she talks in sentences which do not make sense.  Currently on room air and hemodynamically stable -Poison control recommended supportive care, IV fluids and close monitoring of her hemodynamics -Psychiatry evaluation.  Continue bedside sitter. -Once mental status improves, will start her on a diet.  Normocytic anemia -Questionable cause.  Hemoglobin stable.   DVT prophylaxis: Lovenox Code Status: Full Family Communication: None at bedside Disposition Plan: Status is: Observation The patient will require care spanning > 2 midnights and should be moved to inpatient because: Of severity of illness.  Might need placement in a psychiatric facility if psych thinks so  Consultants: Psychiatry  Procedures: None  Antimicrobials: None   Subjective: Patient seen and examined at bedside.  Awake, talks in sentences which are hard to be understood.  No overnight fever, seizures, vomiting reported.  Objective: Vitals:   06/10/22 2004 06/10/22 2204 06/11/22 0000 06/11/22 0406  BP: 93/74 99/72 (!)  97/58 (!) 107/91  Pulse: (!) 52 (!) 54 63 68  Resp: 14 14 14 14   Temp: (!) 97.5 F (36.4 C) 97.6 F (36.4 C) 97.6 F (36.4 C) 98 F (36.7 C)  TempSrc: Oral Oral Oral Oral  SpO2: 98% 99% 97% 96%    Intake/Output Summary (Last 24 hours) at 06/11/2022 1101 Last data filed at 06/11/2022 0636 Gross per 24 hour  Intake --  Output 400 ml  Net -400 ml   There were no vitals filed for this visit.  Examination:  General exam: Appears calm and comfortable.  Currently on room air.  Looks chronically ill and deconditioned. Respiratory system: Bilateral decreased breath sounds at bases with some scattered crackles Cardiovascular system: S1 & S2 heard, intermittently bradycardic gastrointestinal system: Abdomen is nondistended, soft and nontender. Normal bowel sounds heard. Extremities: No cyanosis, clubbing, edema  Central nervous system: Awake, talks in sentences which are hard to be understood.   No focal neurological deficits. Moving extremities Skin: No rashes, lesions or ulcers Psychiatry: Could not be assessed because of mental status.  Currently not agitated.   Data Reviewed: I have personally reviewed following labs and imaging studies  CBC: Recent Labs  Lab 06/10/22 1208 06/11/22 0923  WBC 7.5 8.4  NEUTROABS 5.5  --   HGB 11.2* 10.9*  HCT 36.7 35.5*  MCV 82.5 83.7  PLT 285 244   Basic Metabolic Panel: Recent Labs  Lab 06/10/22 1208 06/11/22 0550  NA 138 140  K 4.2 4.2  CL 107 113*  CO2 25 22  GLUCOSE 134* 81  BUN 16 17  CREATININE 0.74 0.77  CALCIUM 8.5* 8.5*   GFR: CrCl cannot be calculated (Unknown ideal weight.). Liver Function Tests: Recent Labs  Lab 06/10/22 1208 06/11/22 0550  AST 23 40  ALT 24 26  ALKPHOS 58 58  BILITOT 0.4 0.5  PROT 6.2* 6.2*  ALBUMIN 3.2* 3.2*   No results for input(s): "LIPASE", "AMYLASE" in the last 168 hours. No results for input(s): "AMMONIA" in the last 168 hours. Coagulation Profile: No results for input(s): "INR",  "PROTIME" in the last 168 hours. Cardiac Enzymes: No results for input(s): "CKTOTAL", "CKMB", "CKMBINDEX", "TROPONINI" in the last 168 hours. BNP (last 3 results) No results for input(s): "PROBNP" in the last 8760 hours. HbA1C: No results for input(s): "HGBA1C" in the last 72 hours. CBG: Recent Labs  Lab 06/10/22 1159  GLUCAP 134*   Lipid Profile: No results for input(s): "CHOL", "HDL", "LDLCALC", "TRIG", "CHOLHDL", "LDLDIRECT" in the last 72 hours. Thyroid Function Tests: No results for input(s): "TSH", "T4TOTAL", "FREET4", "T3FREE", "THYROIDAB" in the last 72 hours. Anemia Panel: No results for input(s): "VITAMINB12", "FOLATE", "FERRITIN", "TIBC", "IRON", "RETICCTPCT" in the last 72 hours. Sepsis Labs: No results for input(s): "PROCALCITON", "LATICACIDVEN" in the last 168 hours.  No results found for this or any previous visit (from the past 240 hour(s)).       Radiology Studies: No results found.      Scheduled Meds:  enoxaparin (LOVENOX) injection  40 mg Subcutaneous Q24H   Continuous Infusions:  sodium chloride 75 mL/hr at 06/11/22 0918   lactated ringers Stopped (06/10/22 1724)          Glade Lloyd, MD [facility] 06/11/2022, 11:01 AM

## 2022-06-11 NOTE — Progress Notes (Signed)
  [facility] Intensive Outpatient Program Discharge Summary  Shonique Pelphrey [id number]  Admission date: 06/07/2022 Discharge date: 06/09/2022  Reason for admission: per admission assessment note: Analyce Tavares is a 51 year old female who was referred by her psychiatric provider due to chronic suicidal ideations.  Patient has a charted history with major depressive disorder, generalized anxiety disorder, suicidal behavior/self injurious behaviors.  Currently she is prescribed Wellbutrin, BuSpar, Cymbalta, Seroquel, trazodone and Inderal.  She reports she has been taking medications as indicated.    Progress in Program Toward Treatment Goals: Not Progressing, patient appears to need a higher level of care.  Patient with advised to follow-up with [facility] urgent care due to reported suicidal plan and intent.  Therapist stated patient was in group session stating that she has " stockpiled medications" for future suicide attempt. Patient is currently awaiting inpatient admission once she is medically Cleared.    Patient/Guardian was advised Release of Information must be obtained prior to any record release in order to collaborate their care with an outside provider. Patient/Guardian was advised if they have not already done so to contact the registration department to sign all necessary forms in order for [place] to release information regarding their care.   Consent: Patient/Guardian gives verbal consent for treatment and assignment of benefits for services provided during this visit. Patient/Guardian expressed understanding and agreed to proceed.   Oneta Rack, NP 06/11/2022

## 2022-06-11 NOTE — Plan of Care (Signed)

## 2022-06-11 NOTE — Progress Notes (Cosign Needed Addendum)
Psychiatric Initial Adult Assessment   Patient Identification: Natasha [name] MRN:  [id number] Date of Evaluation:  06/11/2022 Referral Source: Horatio Pel Chief Complaint: Suicidal ideations Visit Diagnosis:    ICD-10-CM   1. Severe episode of recurrent major depressive disorder, without psychotic features (HCC)  F33.2       History of Present Illness:  Natasha [name] is a 51 year old female who was referred by her psychiatric provider due to chronic suicidal ideations.  Patient has a charted history with major depressive disorder, generalized anxiety disorder, suicidal behavior/self injurious behaviors.  Currently she is prescribed Wellbutrin, BuSpar, Cymbalta, Seroquel, trazodone and Inderal.  She reports she has been taking medications as indicated.   Natasha [name] reported multiple inpatient admissions.  Reported she completed intensive outpatient programming in the past.  Reports previous suicide attempts.  States she is able to contract for safety for intensive outpatient programming.  States she has been taking medications as indicated.  Currently followed by therapy and psychiatry services.  Patient to start intensive outpatient program 06/07/2022   Associated Signs/Symptoms: Depression Symptoms:  depressed mood, anxiety, (Hypo) Manic Symptoms:  Distractibility, Anxiety Symptoms:  Excessive Worry, Social Anxiety, Psychotic Symptoms:  Hallucinations: None PTSD Symptoms: Had a traumatic exposure:  Previous suicide attempts  Past Psychiatric History: She reported multiple inpatient admissions.  Reports previous suicide attempts.  States she is able to contract for safety for intensive outpatient programming.  States she has been taking medications as indicated.  Currently followed by therapy and psychiatry services.  Previous Psychotropic Medications: Yes   Substance Abuse History in the last 12 months:  No.  Consequences of Substance Abuse: NA  Past Medical History:   Past Medical History:  Diagnosis Date   Anxiety    C6 spinal cord injury (HCC)    Depression    OCD (obsessive compulsive disorder)    PTSD (post-traumatic stress disorder)     Past Surgical History:  Procedure Laterality Date   BACK SURGERY     NECK SURGERY      Family Psychiatric History:   Family History:  Family History  Problem Relation Age of Onset   Alcohol abuse Father    Alcohol abuse Brother    Depression Mother    Alcohol abuse Mother    Parkinson's disease Mother     Social History:   Social History   Socioeconomic History   Marital status: Single    Spouse name: Not on file   Number of children: Not on file   Years of education: Not on file   Highest education level: Not on file  Occupational History   Occupation: Disability  Tobacco Use   Smoking status: Former    Packs/day: 0.00    Types: Cigarettes   Smokeless tobacco: Never  Vaping Use   Vaping Use: Never used  Substance and Sexual Activity   Alcohol use: Yes    Alcohol/week: 0.0 standard drinks of alcohol    Comment: Occasional use   Drug use: No   Sexual activity: Not Currently  Other Topics Concern   Not on file  Social History Narrative   Pt lives alone; receives outpatient psychiatry services through [facility].  Pt is on disability due to a spinal cord injury.    Social Determinants of Health   Financial Resource Strain: Not on file  Food Insecurity: Not on file  Transportation Needs: Not on file  Physical Activity: Not on file  Stress: Not on file  Social Connections: Not on file  Additional Social History:   Allergies:  No Known Allergies  Metabolic Disorder Labs: Lab Results  Component Value Date   HGBA1C 5.1 01/27/2022   MPG 99.67 01/27/2022   MPG 85.32 10/16/2020   Lab Results  Component Value Date   PROLACTIN 121.9 (H) 02/03/2017   Lab Results  Component Value Date   CHOL 236 (H) 10/16/2020   TRIG 94 10/16/2020   HDL 78 10/16/2020   CHOLHDL  3.0 10/16/2020   VLDL 19 10/16/2020   LDLCALC 139 (H) 10/16/2020   LDLCALC 160 (H) 05/26/2020   Lab Results  Component Value Date   TSH 3.615 10/16/2020    Therapeutic Level Labs: Lab Results  Component Value Date   LITHIUM 0.88 02/13/2017   No results found for: "CBMZ" No results found for: "VALPROATE"  Current Medications: No current facility-administered medications for this visit.   No current outpatient medications on file.   Facility-Administered Medications Ordered in Other Visits  Medication Dose Route Frequency Provider Last Rate Last Admin   0.9 %  sodium chloride infusion   Intravenous Continuous Dibia, Elgie Collard, MD 75 mL/hr at 06/11/22 0918 New Bag at 06/11/22 0918   albuterol (PROVENTIL) (2.5 MG/3ML) 0.083% nebulizer solution 2.5 mg  2.5 mg Nebulization Q6H PRN Dibia, Elgie Collard, MD       bisacodyl (DULCOLAX) suppository 10 mg  10 mg Rectal Daily PRN Dibia, Elgie Collard, MD       enoxaparin (LOVENOX) injection 40 mg  40 mg Subcutaneous Q24H Dibia, Elgie Collard, MD   40 mg at 06/10/22 2236   lactated ringers infusion   Intravenous Continuous Dibia, Elgie Collard, MD   Stopped at 06/10/22 1724    Musculoskeletal:   Psychiatric Specialty Exam: Review of Systems  Respiratory: Negative.    Endocrine: Negative.   Psychiatric/Behavioral:  Positive for behavioral problems and decreased concentration. The patient is nervous/anxious.   All other systems reviewed and are negative.   There were no vitals taken for this visit.There is no height or weight on file to calculate BMI.  General Appearance: Casual  Eye Contact:  Good  Speech:  Clear and Coherent  Volume:  Normal  Mood:  Anxious and Depressed  Affect:  Congruent  Thought Process:  Coherent  Orientation:  Full (Time, Place, and Person)  Thought Content:  Logical  Suicidal Thoughts:  Yes.  without intent/plan  Homicidal Thoughts:  No  Memory:  Immediate;   Fair Recent;   Fair Remote;   Fair  Judgement:  Good   Insight:  Good  Psychomotor Activity:  Normal  Concentration:  Concentration: Fair  Recall:  Fair  Fund of Knowledge:Good  Language: Good  Akathisia:  No  Handed:  Right  AIMS (if indicated):  not done  Assets:  Communication Skills Desire for Improvement Resilience Social Support  ADL's:  Intact  Cognition: WNL  Sleep:  Fair   Screenings: AIMS    Flowsheet Row Ad[facility] (Discharged) [facility] Ad[facility] (Discharged) from [facility]) [facility] Admission (Discharged) from [facility] Admission (Discharged) from [facility]  AIMS Total Score 0 0 0 0 0      AUDIT    Flowsheet Row Admission (Discharged) from 01/26/2022 in [facility]PAT[facility] BE[facility]AL MEDICINE Admission (  Discharged) from 10/16/2020 in [facility] INPATIENT BEHAVIORAL MEDICINE Admission (Discharged) from [facility] Admission (Discharged) from [facility] Admission (Discharged) from 10/21/2017 in [facility] BE[facility]ICINE  Alcohol Use Disorder Identification Test Final Score (AUDIT) 1 1 0 0 0      ECT-MADRS    Flowsheet Row ECT Treatment from 11/16/2017 in [facility] Admission (Discharged) from 10/21/2017 in [facility] INPATIENT BEHAVIORAL MEDICINE  MADRS Total Score 4 43      GAD-7    Flowsheet Row Counselor from 06/24/2016 in BE[facility]AL [facility]ALTH OUTPATIENT THERAPY [place]  Total GAD-7 Score 15      Mini-Mental    Flowsheet Row ECT Treatment from 11/16/2017 in [facility] Admission (Discharged) from 10/21/2017 in  [facility] INPATIENT BEHAVIORAL MEDICINE  Total Score (max 30 points ) 29 30      PHQ2-9    Flowsheet Row Counselor from 06/07/2022 in BEHAVIORAL HEALTH INTENSIVE PSYCH Counselor from 06/24/2016 in BEHAVIORAL HEALTH OUTPATIENT THERAPY [place] Counselor from 11/04/2015 in BEHAVIORAL HEALTH OUTPATIENT THERAPY  Counselor from 06/27/2015 in BEHAVIORAL HEALTH INTENSIVE PSYCH  PHQ-2 Total Score 6 2 6 6   PHQ-9 Total Score 27 7 16 26       Flowsheet Row Counselor from 06/07/2022 in BEHAV[facility]AL [facility]ALTH INTENSIVE [facility] ED from 04/21/2022 in [facility] ED from 03/31/2022 in [facility]  C-SSRS RISK CATEGORY Error: Question 6 not populated No Risk No Risk       Assessment and Plan:  Patient to start intensive outpatient programming Continue medications as indicated  Collaboration of Care: Psychiatrist AEB keep all follow-up appointments with 06/21/2022  NP  Patient/Guardian was advised Release of Information must be obtained prior to any record release in order to collaborate their care with an outside provider. Patient/Guardian was advised if they have not already done so to contact the registration department to sign all necessary forms in order for 04/02/2022 to release information regarding their care.   Consent: Patient/Guardian gives verbal consent for treatment and assignment of benefits for services provided during this visit. Patient/Guardian expressed understanding and agreed to proceed.   Horatio Pel, NP 6/30/202311:59 AM

## 2022-06-11 NOTE — Progress Notes (Signed)
Remains confused. Argumentative and yelling out in a loud voice and attempting to get out of bed. Unable to redirect. Sitter at bedside. MD notified. New orders received. Haldol given per order.

## 2022-06-11 NOTE — TOC Initial Note (Signed)
Transition of Care [facility]) - Initial/Assessment Note    Patient Details  Name: Natasha [name] MRN: [id number] Date of Birth: Dec 14, 1970  [facility]) CM/SW Contact:    Otelia Santee, LCSW Phone Number: 06/11/2022, 2:21 PM  Clinical Narrative:                 Pt arrived due to intentional overdose on prescribed medications. IVC paperwork has been faxed to the magistrates office. Currently awaiting psychiatry to evaluate for further recommendations for inpatient psychiatric treatment.   Expected Discharge Plan: Psychiatric Hospital Barriers to Discharge: Continued Medical Work up   Patient Goals and CMS Choice Patient states their goals for this hospitalization and ongoing recovery are:: To return home      Expected Discharge Plan and Services Expected Discharge Plan: Psychiatric Hospital In-house Referral: Clinical Social Work Discharge Planning Services: CM Consult   Living arrangements for the past 2 months: Apartment                 DME Arranged: N/A                    Prior Living Arrangements/Services Living arrangements for the past 2 months: Apartment Lives with:: Self Patient language and need for interpreter reviewed:: Yes Do you feel safe going back to the place where you live?: Yes      Need for Family Participation in Patient Care: No (Comment) Care giver support system in place?: No (comment)   Criminal Activity/Legal Involvement Pertinent to Current Situation/Hospitalization: No - Comment as needed  Activities of Daily Living      Permission Sought/Granted                  Emotional Assessment           Psych Involvement: Yes (comment)  Admission diagnosis:  Benzodiazepine overdose [T42.4X1A] Intentional overdose, initial encounter (HCC) [T50.902A] Patient Active Problem List   Diagnosis Date Noted   Suicidal behavior with attempted self-injury (HCC) 06/11/2022   Acute encephalopathy 06/11/2022    Benzodiazepine overdose 06/10/2022   Suicidal ideation 01/26/2022   Leg weakness, bilateral 11/03/2021   Severe recurrent major depression without psychotic features (HCC) 10/16/2020   MDD (major depressive disorder), recurrent episode, severe (HCC) 10/15/2020   Right foot drop 10/13/2020   Muscle spasticity 01/29/2020   History of DVT (deep vein thrombosis) 05/11/2019   Spinal cord injury, cervical region (HCC) 05/11/2019   Paraplegia (HCC) 10/21/2017   PCP:  Porfirio Oar, PA Pharmacy:   [facility] [place], [place] - [address] [place] [address] Phone: [phone] Fax: [phone]     Social Determinants of Health (SDOH) Interventions    Readmission Risk Interventions     No data to display

## 2022-06-12 DIAGNOSIS — T50902A Poisoning by unspecified drugs, medicaments and biological substances, intentional self-harm, initial encounter: Secondary | ICD-10-CM

## 2022-06-12 DIAGNOSIS — G9340 Encephalopathy, unspecified: Secondary | ICD-10-CM

## 2022-06-12 DIAGNOSIS — T1491XA Suicide attempt, initial encounter: Secondary | ICD-10-CM

## 2022-06-12 LAB — RAPID URINE DRUG SCREEN, HOSP PERFORMED
Amphetamines: NOT DETECTED
Barbiturates: NOT DETECTED
Benzodiazepines: POSITIVE — AB
Cocaine: NOT DETECTED
Opiates: NOT DETECTED
Tetrahydrocannabinol: NOT DETECTED

## 2022-06-12 MED ORDER — LIDOCAINE VISCOUS HCL 2 % MT SOLN
15.0000 mL | Freq: Once | OROMUCOSAL | Status: AC
  Administered 2022-06-12: 15 mL via ORAL
  Filled 2022-06-12: qty 15

## 2022-06-12 MED ORDER — ALUM & MAG HYDROXIDE-SIMETH 200-200-20 MG/5ML PO SUSP
30.0000 mL | Freq: Once | ORAL | Status: AC
  Administered 2022-06-12: 30 mL via ORAL
  Filled 2022-06-12: qty 30

## 2022-06-12 MED ORDER — DIAZEPAM 5 MG PO TABS: 5.0000 mg | ORAL_TABLET | Freq: Three times a day (TID) | ORAL | Status: DC

## 2022-06-12 NOTE — Discharge Summary (Signed)
Physician Discharge Summary  Natasha [name] ZTI:[id number] DOB: 1971-03-01 DOA: 06/10/2022  PCP: Porfirio Oar, PA  Admit date: 06/10/2022 Discharge date: 06/12/2022  Admitted From: Home Disposition: [facility]  Recommendations for Outpatient Follow-up:  Follow up with [facility] provider at earliest convenience on discharge   Home Health: No Equipment/Devices: None  Discharge Condition: Stable CODE STATUS: Full Diet recommendation: Regular  Brief/Interim Summary: 51 y.o. female with medical history significant of multiple suicide attempts, seizures presented with suicidal attempt after intentional benzodiazepine overdose.  EMS found her with empty bottles of Klonopin and propranolol; she received Narcan prior to EMS.  In the ED, she was maintaining her airway but was almost obtunded.  Poison control recommended supportive care, IV fluids and close monitoring of her hemodynamics.  Psychiatry recommended inpatient psychiatric hospitalization once medically stable.  She will be discharged to inpatient psychiatric facility once bed is available.  She is currently medically stable for discharge.      Discharge Diagnoses:   Intentional overdose of benzodiazepines Suicidal attempt Acute toxic encephalopathy -Patient presented with probable intentional overdose of benzodiazepines in a suicidal attempt and was almost obtunded on presentation.  She was maintaining airway. -Mental status improving, still slow to respond.  Has had episodes of intermittent agitation requiring Ativan.  Currently on room air and hemodynamically stable -Poison control recommended supportive care, IV fluids and close monitoring of her hemodynamics -Psychiatry  Psychiatry recommended inpatient psychiatric hospitalization once medically stable.  She will be discharged to inpatient psychiatric facility once bed is available.  She is currently medically stable for discharge.  -Continue bedside sitter.    Normocytic  anemia -Questionable cause.  Hemoglobin stable.   Discharge Instructions Allergies as of 06/12/2022   No Known Allergies      Medication List     STOP taking these medications    Auvelity 45-105 MG Tbcr Generic drug: Dextromethorphan-buPROPion ER   baclofen 10 MG tablet Commonly known as: LIORESAL   busPIRone 10 MG tablet Commonly known as: BUSPAR   clonazePAM 0.5 MG tablet Commonly known as: KLONOPIN   DULoxetine 60 MG capsule Commonly known as: CYMBALTA   hydrOXYzine 50 MG tablet Commonly known as: ATARAX   meloxicam 15 MG tablet Commonly known as: MOBIC   propranolol 20 MG tablet Commonly known as: INDERAL   QUEtiapine 100 MG tablet Commonly known as: SEROQUEL   traZODone 100 MG tablet Commonly known as: DESYREL       TAKE these medications    diazepam 5 MG tablet Commonly known as: VALIUM Take 1 tablet (5 mg total) by mouth every 8 (eight) hours.   famotidine 20 MG tablet Commonly known as: PEPCID Take 1 tablet (20 mg total) by mouth daily.   ibuprofen 200 MG tablet Commonly known as: ADVIL Take 800 mg by mouth every 6 (six) hours as needed for moderate pain.   SUMAtriptan 100 MG tablet Commonly known as: IMITREX Take 1 tablet (100 mg total) by mouth every 2 (two) hours as needed for migraine.         No Known Allergies  Consultations: Psychiatry   Procedures/Studies: No results found.    Subjective: Patient seen and examined at bedside.  Poor historian.  Still slow to respond.  Received Ativan overnight as per nursing staff.  No fever, vomiting reported.  Discharge Exam: Vitals:   06/11/22 2026 06/12/22 0511  BP: 115/81 106/73  Pulse: 73 67  Resp: 16 18  Temp: 98.5 F (36.9 C) 98.8 F (37.1 C)  SpO2: 96%  98%    General: Pt is alert, slow to respond.  Poor historian.   Cardiovascular: rate controlled, S1/S2 + Respiratory: bilateral decreased breath sounds at bases Abdominal: Soft, NT, ND, bowel sounds  + Extremities: no edema, no cyanosis    The results of significant diagnostics from this hospitalization (including imaging, microbiology, ancillary and laboratory) are listed below for reference.     Microbiology: No results found for this or any previous visit (from the past 240 hour(s)).   Labs: BNP (last 3 results) No results for input(s): "BNP" in the last 8760 hours. Basic Metabolic Panel: Recent Labs  Lab 06/10/22 1208 06/11/22 0550  NA 138 140  K 4.2 4.2  CL 107 113*  CO2 25 22  GLUCOSE 134* 81  BUN 16 17  CREATININE 0.74 0.77  CALCIUM 8.5* 8.5*   Liver Function Tests: Recent Labs  Lab 06/10/22 1208 06/11/22 0550  AST 23 40  ALT 24 26  ALKPHOS 58 58  BILITOT 0.4 0.5  PROT 6.2* 6.2*  ALBUMIN 3.2* 3.2*   No results for input(s): "LIPASE", "AMYLASE" in the last 168 hours. No results for input(s): "AMMONIA" in the last 168 hours. CBC: Recent Labs  Lab 06/10/22 1208 06/11/22 0923  WBC 7.5 8.4  NEUTROABS 5.5  --   HGB 11.2* 10.9*  HCT 36.7 35.5*  MCV 82.5 83.7  PLT 285 244   Cardiac Enzymes: No results for input(s): "CKTOTAL", "CKMB", "CKMBINDEX", "TROPONINI" in the last 168 hours. BNP: Invalid input(s): "POCBNP" CBG: Recent Labs  Lab 06/10/22 1159  GLUCAP 134*   D-Dimer No results for input(s): "DDIMER" in the last 72 hours. Hgb A1c No results for input(s): "HGBA1C" in the last 72 hours. Lipid Profile No results for input(s): "CHOL", "HDL", "LDLCALC", "TRIG", "CHOLHDL", "LDLDIRECT" in the last 72 hours. Thyroid function studies No results for input(s): "TSH", "T4TOTAL", "T3FREE", "THYROIDAB" in the last 72 hours.  Invalid input(s): "FREET3" Anemia work up No results for input(s): "VITAMINB12", "FOLATE", "FERRITIN", "TIBC", "IRON", "RETICCTPCT" in the last 72 hours. Urinalysis    Component Value Date/Time   COLORURINE YELLOW 11/02/2021 1718   APPEARANCEUR CLEAR 11/02/2021 1718   LABSPEC 1.021 11/02/2021 1718   PHURINE 5.5 11/02/2021  1718   GLUCOSEU NEGATIVE 11/02/2021 1718   HGBUR NEGATIVE 11/02/2021 1718   BILIRUBINUR NEGATIVE 11/02/2021 1718   KETONESUR 15 (A) 11/02/2021 1718   PROTEINUR NEGATIVE 11/02/2021 1718   UROBILINOGEN 1.0 06/19/2015 1532   NITRITE NEGATIVE 11/02/2021 1718   LEUKOCYTESUR NEGATIVE 11/02/2021 1718   Sepsis Labs Recent Labs  Lab 06/10/22 1208 06/11/22 0923  WBC 7.5 8.4   Microbiology No results found for this or any previous visit (from the past 240 hour(s)).   Time coordinating discharge: 35 minutes  SIGNED:   Glade Lloyd, MD  [facility] 06/12/2022, 8:07 AM

## 2022-06-12 NOTE — Evaluation (Signed)
Physical Therapy Evaluation Patient Details Name: Natasha [name] MRN: [id number] DOB: [birth year] Today's Date: 06/12/2022  History of Present Illness  51 yo female admitted with suicide attempt. Hx of C6 SCI 2002, multiple suicide attempts, PTSD, depression, anxiety  Clinical Impression  On eval, pt required Mod A +2 for mobility. She walked ~50 feet with use of EVA walker. Ambulation distance was limited by fatigue and R hip pain. Pt also c/o some dizziness towards end of walker. Pt presents with increased tone in bil LEs, R>L. Per chart review, plan is for d/c to an inpatient psych facility. At this time, PT recommendation is for ST SNF (it pt is agreeable) unless mobility improves significantly and pt is able to demonstrate safe ability to return home alone. At a minimum, pt needs to be able to perform bed mobility and transfer bed<>chair without physical assistance.      Recommendations for follow up therapy are one component of a multi-disciplinary discharge planning process, led by the attending physician.  Recommendations may be updated based on patient status, additional functional criteria and insurance authorization.  Follow Up Recommendations Skilled nursing-short term rehab (<3 hours/day)      Assistance Recommended at Discharge Frequent or constant Supervision/Assistance  Patient can return home with the following  Assist for transportation;Direct supervision/assist for medications management;Assistance with cooking/housework;Help with stairs or ramp for entrance;A lot of help with bathing/dressing/bathroom;A lot of help with walking and/or transfers    Equipment Recommendations None recommended by PT  Recommendations for Other Services  OT consult    Functional Status Assessment Patient has had a recent decline in their functional status and demonstrates the ability to make significant improvements in function in a reasonable and predictable amount of time.      Precautions / Restrictions Precautions Precautions: Fall Restrictions Weight Bearing Restrictions: No      Mobility  Bed Mobility Overal bed mobility: Needs Assistance Bed Mobility: Supine to Sit, Sit to Supine     Supine to sit: Mod assist, +2 for physical assistance, +2 for safety/equipment, HOB elevated Sit to supine: Mod assist, +2 for physical assistance, +2 for safety/equipment, HOB elevated   General bed mobility comments: Asssit for trunk and bil LEs. Increased time. Pt able to sit with Min guard A once positioned at EOB.    Transfers Overall transfer level: Needs assistance Equipment used:  (EVA walker) Transfers: Sit to/from Stand Sit to Stand: Min assist, +2 physical assistance, +2 safety/equipment           General transfer comment: Assist to rise, stabilize, control descent. Cues for safety. Some assistance required for UE positioning on/off walker.    Ambulation/Gait Ambulation/Gait assistance: Min assist, +2 physical assistance, +2 safety/equipment Gait Distance (Feet): 50 Feet Assistive device: Ethelene Hal Gait Pattern/deviations: Decreased stride length, Step-through pattern       General Gait Details: Assist to stabilze pt and walker throughout distance. Distance limited by pt fatigue and report of R hip pain. Fall risk.  Stairs            Wheelchair Mobility    Modified Rankin (Stroke Patients Only)       Balance Overall balance assessment: Needs assistance Sitting-balance support: Feet supported Sitting balance-Leahy Scale: Fair     Standing balance support: Bilateral upper extremity supported Standing balance-Leahy Scale: Poor                               Pertinent  Vitals/Pain Pain Assessment Pain Assessment: 0-10 Pain Score: 7  Pain Location: R hip Pain Descriptors / Indicators: Discomfort, Sore, Aching Pain Intervention(s): Limited activity within patient's tolerance, Monitored during session, Repositioned     Home Living Family/patient expects to be discharged to:: Private residence Living Arrangements: Alone Available Help at Discharge: Family Type of Home: Apartment Home Access: Level entry       Home Layout: One level Home Equipment: Wheelchair - manual;Tub bench;(platform walker ordered per patient) Additional Comments: HHPT services prior to admission    Prior Function Prior Level of Function : Independent/Modified Independent;History of Falls (last six months)             Mobility Comments: uses wc at home, was using R forearm crutch at one point. Pt reports using platform walker with HHPT most recently.  ADLs Comments: independent ADLs, IADLs     Hand Dominance        Extremity/Trunk Assessment   Upper Extremity Assessment Upper Extremity Assessment: Defer to OT evaluation    Lower Extremity Assessment Lower Extremity Assessment:  (bil increased tone, spasticity R>L. With time able to range to at least 90-90. Some inversion of R ankle noted at rest and with WBing)    Cervical / Trunk Assessment Cervical / Trunk Assessment: Normal  Communication   Communication: No difficulties  Cognition Arousal/Alertness: Awake/alert Behavior During Therapy: WFL for tasks assessed/performed Overall Cognitive Status: Within Functional Limits for tasks assessed                                          General Comments      Exercises Other Exercises Other Exercises: passive ROM of UEs and LEs prior to ambulating.   Assessment/Plan    PT Assessment Patient needs continued PT services  PT Problem List Decreased strength;Decreased balance;Decreased range of motion;Decreased activity tolerance;Decreased mobility;Decreased knowledge of use of DME;Pain       PT Treatment Interventions Functional mobility training;DME instruction;Gait training;Stair training;Therapeutic activities;Therapeutic exercise;Balance training;Patient/family education    PT Goals  (Current goals can be found in the Care Plan section)  Acute Rehab PT Goals Patient Stated Goal: to regain PLOF/independence PT Goal Formulation: With patient Time For Goal Achievement: 06/26/22 Potential to Achieve Goals: Good    Frequency Min 2X/week     Co-evaluation               AM-PAC PT "6 Clicks" Mobility  Outcome Measure Help needed turning from your back to your side while in a flat bed without using bedrails?: A Lot Help needed moving from lying on your back to sitting on the side of a flat bed without using bedrails?: A Lot Help needed moving to and from a bed to a chair (including a wheelchair)?: A Lot Help needed standing up from a chair using your arms (e.g., wheelchair or bedside chair)?: A Lot Help needed to walk in hospital room?: A Lot Help needed climbing 3-5 steps with a railing? : Total 6 Click Score: 11    End of Session Equipment Utilized During Treatment: Gait belt Activity Tolerance: Patient tolerated treatment well;Patient limited by pain Patient left: in bed;with call bell/phone within reach;with bed alarm set;with nursing/sitter in room   PT Visit Diagnosis: Muscle weakness (generalized) (M62.81);Difficulty in walking, not elsewhere classified (R26.2);Pain;Other symptoms and signs involving the nervous system (R29.898) Pain - Right/Left: Right Pain - part of body: Hip  Time: 1100-1146 PT Time Calculation (min) (ACUTE ONLY): 46 min   Charges:   PT Evaluation $PT Eval Moderate Complexity: 1 Mod PT Treatments $Gait Training: 8-22 mins $Therapeutic Exercise: 8-22 mins          Faye Ramsay, PT Acute Rehabilitation  Office: [phone] Pager: [phone]

## 2022-06-13 MED ORDER — LORATADINE 10 MG PO TABS
10.0000 mg | ORAL_TABLET | Freq: Every day | ORAL | Status: DC | PRN
  Administered 2022-06-13: 10 mg via ORAL
  Filled 2022-06-13: qty 1

## 2022-06-13 MED ORDER — POLYVINYL ALCOHOL 1.4 % OP SOLN
2.0000 [drp] | OPHTHALMIC | Status: DC | PRN
  Administered 2022-06-13 – 2022-06-15 (×4): 2 [drp] via OPHTHALMIC
  Filled 2022-06-13: qty 15

## 2022-06-13 MED ORDER — ALUM & MAG HYDROXIDE-SIMETH 200-200-20 MG/5ML PO SUSP
30.0000 mL | Freq: Four times a day (QID) | ORAL | Status: DC | PRN
  Administered 2022-06-13 – 2022-06-15 (×4): 30 mL via ORAL
  Filled 2022-06-13 (×4): qty 30

## 2022-06-13 NOTE — Evaluation (Signed)
Occupational Therapy Evaluation Patient Details Name: Natasha [name] MRN: [id number] DOB: 1971/03/30 Today's Date: 06/13/2022   History of Present Illness 51 yo female admitted with suicide attempt. Hx of C6 SCI 2002, multiple suicide attempts, PTSD, depression, anxiety   Clinical Impression   Ms. Natasha [name] is a 51 year old woman with above medical history. On evaluation she presents at her baseline physically - gross functional use of shoulders and elbows but decreased ROM, strength and coordination of wrist and hands bilaterally. She exhibits hypertonicity in her lower extremities. At baseline she modified independent at home with a wheelchair level and an array of DME and compensatory strategies. She is well versed in all equipment and strategies. She is only limited only by hospital environment and lack of wheelchair.  From on OT standpoint she has no OT needs as she is at her baseline. If patient discharges to inpatient psych facility - she WILL NEED her wheelchair to be functional and potentially a tub bench for showering.      Recommendations for follow up therapy are one component of a multi-disciplinary discharge planning process, led by the attending physician.  Recommendations may be updated based on patient status, additional functional criteria and insurance authorization.   Follow Up Recommendations  No OT follow up    Assistance Recommended at Discharge None  Patient can return home with the following Assistance with cooking/housework;Assist for transportation;Help with stairs or ramp for entrance    Functional Status Assessment  Patient has not had a recent decline in their functional status  Equipment Recommendations  None recommended by OT    Recommendations for Other Services       Precautions / Restrictions Precautions Precautions: Fall Precaution Comments: hypertonicity in LEs Restrictions Weight Bearing Restrictions: No      Mobility Bed  Mobility Overal bed mobility: Needs Assistance Bed Mobility: Supine to Sit     Supine to sit: Min assist     General bed mobility comments: Min assist for trunk to transfer to edge of bed. Patient used therapist elbow like she would typically use her bed rail.    Transfers Overall transfer level: Needs assistance   Transfers: Sit to/from Stand Sit to Stand: Min guard           General transfer comment: Min guard to pivot to recliner. Patient has to use hand holds - but physically did it herself      Balance Overall balance assessment: Needs assistance Sitting-balance support: No upper extremity supported, Feet supported Sitting balance-Leahy Scale: Fair     Standing balance support: Reliant on assistive device for balance Standing balance-Leahy Scale: Poor                             ADL either performed or assessed with clinical judgement   ADL Overall ADL's : At baseline                                       General ADL Comments: Patient is at baseline in regards to physical abilities and only limited by hospital environment. Requires her wc for transfers to toilet. She uses multiple compensatory strategies to perform ADLs at home - including use of wc, tub bench, specific clothing, AE and needing increased time. Patient reports she doesn't wear socks, wears slip on shoes and typically wears scrubs.  Vision Patient Visual Report: No change from baseline Vision Assessment?: No apparent visual deficits     Perception     Praxis      Pertinent Vitals/Pain Pain Assessment Pain Assessment: No/denies pain     Hand Dominance Right   Extremity/Trunk Assessment Upper Extremity Assessment Upper Extremity Assessment: RUE deficits/detail;LUE deficits/detail RUE Deficits / Details: Grossly functional shoulder and elbow movement, dcreased wrist and finger movement RUE Coordination: decreased fine motor;decreased gross motor LUE  Deficits / Details: Grossly functional shoulder and elbow movement, dcreased wrist and finger movement LUE Coordination: decreased fine motor;decreased gross motor   Lower Extremity Assessment Lower Extremity Assessment: Defer to PT evaluation   Cervical / Trunk Assessment Cervical / Trunk Assessment: Normal   Communication Communication Communication: No difficulties   Cognition Arousal/Alertness: Awake/alert Behavior During Therapy: WFL for tasks assessed/performed Overall Cognitive Status: Within Functional Limits for tasks assessed                                       General Comments       Exercises     Shoulder Instructions      Home Living Family/patient expects to be discharged to:: Private residence Living Arrangements: Alone Available Help at Discharge: Family Type of Home: Apartment Home Access: Level entry     Home Layout: One level     Bathroom Shower/Tub: Chief Strategy Officer: Handicapped height     Home Equipment: Wheelchair - Careers adviser (comment);Tub bench;Adaptive equipment Adaptive Equipment: Reacher;Sock aid;Long-handled shoe horn;Long-handled sponge;Feeding equipment Additional Comments: button aide      Prior Functioning/Environment Prior Level of Function : Independent/Modified Independent;History of Falls (last six months)             Mobility Comments: uses wc at home, R forearm crutch walking without assist. ADLs Comments: modified independent ADLs, IADLs        OT Problem List: Decreased range of motion;Impaired tone;Impaired UE functional use      OT Treatment/Interventions:      OT Goals(Current goals can be found in the care plan section) Acute Rehab OT Goals OT Goal Formulation: All assessment and education complete, DC therapy  OT Frequency:      Co-evaluation              AM-PAC OT "6 Clicks" Daily Activity     Outcome Measure Help from another person eating meals?: A  Little Help from another person taking care of personal grooming?: A Little Help from another person toileting, which includes using toliet, bedpan, or urinal?: A Little Help from another person bathing (including washing, rinsing, drying)?: A Little Help from another person to put on and taking off regular upper body clothing?: A Little Help from another person to put on and taking off regular lower body clothing?: A Little 6 Click Score: 18   End of Session Nurse Communication: Mobility status  Activity Tolerance: Patient tolerated treatment well Patient left: in chair;with call bell/phone within reach  OT Visit Diagnosis: Other symptoms and signs involving the nervous system (R29.898)                Time: 8469-6295 OT Time Calculation (min): 21 min Charges:  OT General Charges $OT Visit: 1 Visit OT Evaluation $OT Eval Low Complexity: 1 Low  Ebb Carelock, OTR/L Acute Care Rehab Services  Office [phone] Pager: [phone]   Kelli Churn 06/13/2022, 1:22 PM

## 2022-06-13 NOTE — Progress Notes (Signed)
Patient is currently waiting to be discharged to SNF or [facility].  Patient seen and examined at bedside.  She is currently medically stable for discharge.  Please refer to the full discharge summary done by me on 06/12/2022 for full details.  Continue physical therapy evaluation while patient is still inpatient.

## 2022-06-13 NOTE — NC FL2 (Signed)
MEDICAID FL2 LEVEL OF CARE SCREENING TOOL     IDENTIFICATION  Patient Name: Natasha [name] Birthdate: [birth year] Sex: female Admission Date (Current Location): 06/10/2022  [place] and [place] Number:  [facility] Bast [id number] Facility and Address:  [facility],  [address], [place] [address]      Provider Number: [id number]  Attending Physician Name and Address:  Glade Lloyd, MD  Relative Name and Phone Number:  [place],[name] Friend [phone]    Sukhu,tom Brother [phone]  [phone]  Merry Proud   [phone]    Current Level of Care: Hospital Recommended Level of Care: Skilled Nursing Facility Prior Approval Number:    Date Approved/Denied:   PASRR Number: Pending  Discharge Plan: SNF    Current Diagnoses: Patient Active Problem List   Diagnosis Date Noted   Suicidal behavior with attempted self-injury (HCC) 06/11/2022   Acute encephalopathy 06/11/2022   Suicidal overdose (HCC) 06/11/2022   Benzodiazepine overdose 06/10/2022   Suicidal ideation 01/26/2022   Leg weakness, bilateral 11/03/2021   Severe recurrent major depression without psychotic features (HCC) 10/16/2020   MDD (major depressive disorder), recurrent episode, severe (HCC) 10/15/2020   Right foot drop 10/13/2020   Muscle spasticity 01/29/2020   History of DVT (deep vein thrombosis) 05/11/2019   Spinal cord injury, cervical region (HCC) 05/11/2019   Paraplegia (HCC) 10/21/2017    Orientation RESPIRATION BLADDER Height & Weight     Self, Time, Situation, Place  Normal Continent Weight:   Height:     BEHAVIORAL SYMPTOMS/MOOD NEUROLOGICAL BOWEL NUTRITION STATUS      Continent Diet  AMBULATORY STATUS COMMUNICATION OF NEEDS Skin   Limited Assist Verbally Normal                       Personal Care Assistance Level of Assistance  Bathing, Feeding, Dressing Bathing Assistance: Limited assistance Feeding assistance: Independent Dressing  Assistance: Limited assistance     Functional Limitations Info  Sight, Speech, Hearing Sight Info: Adequate Hearing Info: Adequate Speech Info: Adequate    SPECIAL CARE FACTORS FREQUENCY  PT (By licensed PT), OT (By licensed OT)     PT Frequency: Minimum 5x a week OT Frequency: Minimum 5x a week            Contractures Contractures Info: Not present    Additional Factors Info  Code Status, Allergies Code Status Info: Full Code Allergies Info: No Known Allergies           Current Medications (06/13/2022):  This is the current hospital active medication list Current Facility-Administered Medications  Medication Dose Route Frequency Provider Last Rate Last Admin   albuterol (PROVENTIL) (2.5 MG/3ML) 0.083% nebulizer solution 2.5 mg  2.5 mg Nebulization Q6H PRN Dibia, Elgie Collard, MD       alum & mag hydroxide-simeth (MAALOX/MYLANTA) 200-200-20 MG/5ML suspension 30 mL  30 mL Oral Q6H PRN Hanley Ben, Kshitiz, MD   30 mL at 06/13/22 1334   bisacodyl (DULCOLAX) suppository 10 mg  10 mg Rectal Daily PRN Dibia, Elgie Collard, MD       diazepam (VALIUM) tablet 5 mg  5 mg Oral Q8H Starkes-Perry, Juel Burrow, FNP   5 mg at 06/13/22 1303   enoxaparin (LOVENOX) injection 40 mg  40 mg Subcutaneous Q24H Dibia, Elgie Collard, MD   40 mg at 06/12/22 2225   haloperidol lactate (HALDOL) injection 2-5 mg  2-5 mg Intravenous Q6H PRN Glade Lloyd, MD   5 mg at 06/12/22 2340   LORazepam (ATIVAN)  injection 1 mg  1 mg Intravenous Q4H PRN Glade Lloyd, MD   1 mg at 06/12/22 0112   Oral care mouth rinse  15 mL Mouth Rinse PRN Glade Lloyd, MD         Discharge Medications: Please see discharge summary for a list of discharge medications.  Relevant Imaging Results:  Relevant Lab Results:   Additional Information SSN [id number]  Darleene Cleaver, LCSW

## 2022-06-13 NOTE — TOC Progression Note (Addendum)
Transition of Care [facility]) - Progression Note    Patient Details  Name: Natasha [name] MRN: [id number] Date of Birth: 1971-01-09  Transition of Care [facility]) CM/SW Contact  Darleene Cleaver, [place] Phone Number: 06/13/2022, 7:27 PM  Clinical Narrative:     Patient is under IVC, PT recommending SNF for short term rehab.  Patient will have to improve strength in order to go to [facility].  PT and OT following patient.  CSW to continue to follow patient's progress throughout discharge planning.  TOC leadership notified about possible difficult to place patient.  [place] number is also pending.  Expected Discharge Plan: [facility] Barriers to Discharge: Continued Medical Work up  Expected Discharge Plan and Services Expected Discharge Plan: Psychiatric Hospital In-house Referral: Clinical Social Work Discharge Planning Services: CM Consult   Living arrangements for the past 2 months: Apartment Expected Discharge Date: 06/12/22               DME Arranged: N/A                     Social Determinants of Health (SDOH) Interventions    Readmission Risk Interventions     No data to display

## 2022-06-14 ENCOUNTER — Other Ambulatory Visit: Payer: Self-pay

## 2022-06-14 ENCOUNTER — Telehealth (HOSPITAL_COMMUNITY): Payer: Self-pay | Admitting: Psychiatry

## 2022-06-14 ENCOUNTER — Ambulatory Visit (HOSPITAL_COMMUNITY): Payer: 59

## 2022-06-14 NOTE — Consult Note (Signed)
[name] Face-to-Face Psychiatry Consult   Reason for Consult:   Referring Physician: Dr. Hanley Ben Patient Identification: Natasha [name] MRN:  [id number] Principal Diagnosis: Benzodiazepine overdose Diagnosis:  Principal Problem:   Benzodiazepine overdose Active Problems:   Suicidal behavior with attempted self-injury (HCC)   Acute encephalopathy   Suicidal overdose (HCC)   Total Time spent with patient: 1 hour  Subjective: "What's the status of the beds? What are we waiting for? Can I go home and do partial?  "   Natasha [name] is a 51 y.o. female patient presented to [facility] ED via EMS after suicide attempt by overdose.  The patient is here following a suicide attempt by overdose on most of her medication, after being dismissed from her intensive outpatient program through call behavioral health outpatient.  Patient has had approximately 7 emergency room visits/admissions due to depression and or self-harm injuries that result in medical assistance.  Patient also completed ECT therapy under the services of Dr. Toni Amend at [facility].  Patient has history of multiple suicide attempts, all of high lethality.  Patient suffered a spinal cord injury after jumping from a building.   Patient was receiving outpatient Psychiatric services through [facility] behavioral health outpatient most recently.  Prior to this psychiatric provider she was under the care of [facility]. She has attempted suicide 4 times (jump, (2) OD on Tylenol and hydroxyzine, hanging). She denies any illicit substance use or alcohol use at this time. UDS not obtained on admission.   The patient was seen face-to-face by this provider; the chart was reviewed and consulted with Dr. Lucianne Muss on 06/14/22    Today upon evaluation patient is appropriate and alert. She appears to be engaging well with staff and Clinical research associate. She is observed to be participating in physical therapy, and able to participate in psych evaluation at the  same time. She acknowledges her suicide attempt, and how she been able to identify strong coping skills and behavior techniques during her admission to partial hospitalization. Discussed with patient her recent suicide attempt, will require inpatient level of care as she has failed outpatient services. As previously discussed patient with difficulty with rejection and attachment, was dismissed from intensive outpatient programming and not allowed to participate in a triathlon due to weather conditions which resulted in this suicide attempt.   She continues to lack skills to help with emotional dysregulation and rejection , Continues to be impulsive with poor decision making and is unable to remain safe at home.  Will resume home medications at this time to include Cymbalta, Seroquel, and buspirone. Additional medication is not available on current formulary, and will need to be brought in from home.  This has been discussed with patient.  She currently denies any suicidal thoughts, despite her recent suicide attempt that resulted in medical admission.  She currently denies homicidal thoughts and or auditory or visual hallucinations.  She does not appear to be responding to internal stimuli, external stimuli, and or exhibiting delusional thought disorder.  She has had no urges to self-harm while on the unit, and she is able to contract for safety at this time.  Patient appears to be engaged and motivated to seek inpatient psychiatric services, in order to return home and progress with her life.  HPI: Natasha [name] is a 51 y.o. female  with a history of anxiety, depression, OCD, PTSD who presents from home voluntarily for suicidal thoughts.  Patient has a history of receiving ECT approximately 4 months ago with Dr.  Clapacs, for severe treatment resistant depression.  At that time patient had suicidal thoughts to kill herself with cyanide.  Patient reports decreased oral intake.  She denies history of  alcohol withdrawal or dependence.  She denies any other drug use.  Patient denies any medical complaints.     Past Psychiatric History:  OCD (obsessive compulsive disorder)   PTSD (post-traumatic stress disorder) Anxiety Major Depression Disorder   Risk to Self:   Risk to Others:   Prior Inpatient Therapy:   Prior Outpatient Therapy:    Past Medical History:  Past Medical History:  Diagnosis Date   Anxiety    C6 spinal cord injury (HCC)    Depression    OCD (obsessive compulsive disorder)    PTSD (post-traumatic stress disorder)     Past Surgical History:  Procedure Laterality Date   BACK SURGERY     NECK SURGERY     Family History:  Family History  Problem Relation Age of Onset   Alcohol abuse Father    Alcohol abuse Brother    Depression Mother    Alcohol abuse Mother    Parkinson's disease Mother    Family Psychiatric  History: Brother, Gearldine Shown and great grandfather all suffered from addiction.  Social History:  Social History   Substance and Sexual Activity  Alcohol Use Yes   Alcohol/week: 0.0 standard drinks of alcohol   Comment: Occasional use     Social History   Substance and Sexual Activity  Drug Use No    Social History   Socioeconomic History   Marital status: Single    Spouse name: Not on file   Number of children: Not on file   Years of education: Not on file   Highest education level: Not on file  Occupational History   Occupation: Disability  Tobacco Use   Smoking status: Former    Packs/day: 0.00    Types: Cigarettes   Smokeless tobacco: Never  Vaping Use   Vaping Use: Never used  Substance and Sexual Activity   Alcohol use: Yes    Alcohol/week: 0.0 standard drinks of alcohol    Comment: Occasional use   Drug use: No   Sexual activity: Not Currently  Other Topics Concern   Not on file  Social History Narrative   Pt lives alone; receives outpatient psychiatry services through [facility].  Pt is on disability due  to a spinal cord injury.    Social Determinants of Health   Financial Resource Strain: Not on file  Food Insecurity: Not on file  Transportation Needs: Not on file  Physical Activity: Not on file  Stress: Not on file  Social Connections: Not on file   Additional Social History:    Allergies:  No Known Allergies  Labs:  No results found for this or any previous visit (from the past 48 hour(s)).   Current Facility-Administered Medications  Medication Dose Route Frequency Provider Last Rate Last Admin   albuterol (PROVENTIL) (2.5 MG/3ML) 0.083% nebulizer solution 2.5 mg  2.5 mg Nebulization Q6H PRN Dibia, Elgie Collard, MD       alum & mag hydroxide-simeth (MAALOX/MYLANTA) 200-200-20 MG/5ML suspension 30 mL  30 mL Oral Q6H PRN Hanley Ben, Kshitiz, MD   30 mL at 06/14/22 0537   bisacodyl (DULCOLAX) suppository 10 mg  10 mg Rectal Daily PRN Dibia, Elgie Collard, MD       diazepam (VALIUM) tablet 5 mg  5 mg Oral Q8H Maryagnes Amos, FNP   5 mg at 06/14/22  0537   enoxaparin (LOVENOX) injection 40 mg  40 mg Subcutaneous Q24H Dibia, Elgie Collard, MD   40 mg at 06/13/22 2103   haloperidol lactate (HALDOL) injection 2-5 mg  2-5 mg Intravenous Q6H PRN Glade Lloyd, MD   5 mg at 06/13/22 2230   loratadine (CLARITIN) tablet 10 mg  10 mg Oral Daily PRN Glade Lloyd, MD   10 mg at 06/13/22 2230   LORazepam (ATIVAN) injection 1 mg  1 mg Intravenous Q4H PRN Glade Lloyd, MD   1 mg at 06/14/22 0014   Oral care mouth rinse  15 mL Mouth Rinse PRN Glade Lloyd, MD       polyvinyl alcohol (LIQUIFILM TEARS) 1.4 % ophthalmic solution 2 drop  2 drop Both Eyes PRN Glade Lloyd, MD   2 drop at 06/13/22 2240    Musculoskeletal: Strength & Muscle Tone: flaccid, abnormal, and decreased Gait & Station: unsteady, ataxic Patient leans: Right, Left, and Backward  Psychiatric Specialty Exam:  Presentation  General Appearance: Appropriate for Environment; Casual  Eye Contact:Good  Speech:Clear and  Coherent; Normal Rate  Speech Volume:Normal  Handedness:Right   Mood and Affect  Mood:Depressed  Affect:Appropriate; Congruent   Thought Process  Thought Processes:Coherent; Linear  Descriptions of Associations:Intact  Orientation:Full (Time, Place and Person)  Thought Content:Logical  History of Schizophrenia/Schizoaffective disorder:No Duration of Psychotic Symptoms:No data recorded Hallucinations:Hallucinations: None  Ideas of Reference:None  Suicidal Thoughts:Suicidal Thoughts: Yes, Passive (Denies at present, despite recent suicie attempt x 3 days ago.) SI Passive Intent and/or Plan: Without Intent; Without Plan  Homicidal Thoughts:Homicidal Thoughts: No   Sensorium  Memory:Immediate Good; Recent Good; Remote Good  Judgment:Fair  Insight:Fair   Executive Functions  Concentration:Good  Attention Span:Good  Recall:Good  Fund of Knowledge:Good  Language:Good   Psychomotor Activity  Psychomotor Activity:Psychomotor Activity: Normal   Assets  Assets:Communication Skills; Physical Health; Resilience; Social Support; Financial Resources/Insurance   Sleep  Sleep:Sleep: Good   Physical Exam: Physical Exam Vitals and nursing note reviewed.  Constitutional:      Appearance: Normal appearance. She is normal weight.  HENT:     Head: Normocephalic and atraumatic.     Right Ear: External ear normal.     Left Ear: External ear normal.     Nose: Nose normal.     Mouth/Throat:     Mouth: Mucous membranes are dry.  Eyes:     Conjunctiva/sclera: Conjunctivae normal.  Cardiovascular:     Rate and Rhythm: Normal rate and regular rhythm.     Pulses: Normal pulses.  Pulmonary:     Effort: Pulmonary effort is normal.  Musculoskeletal:        General: Normal range of motion.     Cervical back: Normal range of motion and neck supple.  Neurological:     General: No focal deficit present.     Mental Status: She is alert and oriented to person, place,  and time. Mental status is at baseline.  Psychiatric:        Attention and Perception: Attention and perception normal.        Mood and Affect: Mood is anxious. Affect is not blunt or flat.        Speech: Speech normal.        Behavior: Behavior normal. Behavior is cooperative.        Thought Content: Thought content includes suicidal ideation. Thought content does not include suicidal plan.        Cognition and Memory: Cognition and memory normal.  Judgment: Judgment is impulsive. Judgment is not inappropriate.    Review of Systems  Psychiatric/Behavioral:  Positive for depression and suicidal ideas.   All other systems reviewed and are negative.  Blood pressure 131/90, pulse 73, temperature 98.3 F (36.8 C), temperature source Oral, resp. rate 18, SpO2 99 %. There is no height or weight on file to calculate BMI.  Treatment Plan Summary: Medication management and Plan Patient does meet criteria for psychiatric inpatient admission -Continue Valium 5 mg p.o. 3 times daily.   -We will need to notify prescribing providers most recent overdose, and need to consider benzodiazepine taper for patient safety.                -Patient currently understands she requires admission to inpatient psychiatric hospital due to high lethality of most recent suicide attempt.  In the event patient attempts to leave will need to be placed under involuntary commitment at that time. -Due to unpredictable behaviors and behavioral dysregulation, will recommend one-to-one safety sitter. -Consider [facility] consult for referral for dialectical behavioral therapy.  Disposition: Recommend psychiatric Inpatient admission when medically cleared. Supportive therapy provided about ongoing stressors.  Maryagnes Amos, FNP 06/14/2022 11:15 AM

## 2022-06-14 NOTE — TOC Transition Note (Signed)
Transition of Care [facility]) - CM/SW Discharge Note   Patient Details  Name: Natasha [name] MRN: [id number] Date of Birth: 1971/05/25  Transition of Care [facility]) CM/SW Contact:  Otelia Santee, LCSW Phone Number: 06/14/2022, 12:15 PM   Clinical Narrative:    Patient was accepted to [facility] and can arrive at the [facility] on 06/15/2022 at or after 10am.  Attending provider is Dr. Etheleen Nicks. Nurses call report to [phone]. Patient is IVC and will need to be transported by law enforcement- [phone].   Final next level of care: Psychiatric Hospital Barriers to Discharge: Barriers Resolved   Patient Goals and CMS Choice Patient states their goals for this hospitalization and ongoing recovery are:: To return home      Discharge Placement                       Discharge Plan and Services In-house Referral: Clinical Social Work Discharge Planning Services: CM Consult            DME Arranged: N/A DME Agency: NA                  Social Determinants of Health (SDOH) Interventions     Readmission Risk Interventions     No data to display

## 2022-06-14 NOTE — Progress Notes (Addendum)
Physical Therapy Treatment Patient Details Name: Natasha [name] MRN: [id number] DOB: [birth year] Today's Date: 06/14/2022   History of Present Illness 51 yo female admitted with suicide attempt. Hx of C6 SCI 2002, multiple suicide attempts, PTSD, depression, anxiety    PT Comments    Progressing well with mobility. Practiced bed mobility, transfers and some ambulation. *At baseline, pt primarily performs transfers to/from wheelchair. She will require a wheelchair at behavioral health*. Will continue to follow pt during this hospital stay.     Recommendations for follow up therapy are one component of a multi-disciplinary discharge planning process, led by the attending physician.  Recommendations may be updated based on patient status, additional functional criteria and insurance authorization.  Follow Up Recommendations   plan is for inpatient behavioral health     Assistance Recommended at Discharge Frequent or constant Supervision/Assistance  Patient can return home with the following Assist for transportation;Direct supervision/assist for medications management;Assistance with cooking/housework;Help with stairs or ramp for entrance;A little help with walking and/or transfers;A little help with bathing/dressing/bathroom   Equipment Recommendations  None recommended by PT    Recommendations for Other Services       Precautions / Restrictions Precautions Precautions: Fall Restrictions Weight Bearing Restrictions: No     Mobility  Bed Mobility Overal bed mobility: Needs Assistance Bed Mobility: Rolling, Sidelying to Sit Rolling: Supervision Sidelying to sit: Min assist (HOB flat)       General bed mobility comments: Min A to get trunk fully upright. Increased time. Pt requested to have bed flat.    Transfers Overall transfer level: Needs assistance   Transfers: Sit to/from Stand, Bed to chair/wheelchair/BSC Sit to Stand: Min guard Stand pivot transfers: Min  guard         General transfer comment: Min guard A for bed<>recliner transfers x 6 for practice. Pt used armrests for support to steady.    Ambulation/Gait Ambulation/Gait assistance: Min assist Gait Distance (Feet): 15 Feet Assistive device:  (pt held onto therapist's arm and objects in environment) Gait Pattern/deviations: Decreased stride length, Step-through pattern       General Gait Details: Assist to stabilze pt throughout distance. Walked short distance to/from bathroom for activity.   Stairs             Wheelchair Mobility    Modified Rankin (Stroke Patients Only)       Balance Overall balance assessment: Needs assistance         Standing balance support: Reliant on assistive device for balance, During functional activity, Single extremity supported Standing balance-Leahy Scale: Poor                              Cognition Arousal/Alertness: Awake/alert Behavior During Therapy: WFL for tasks assessed/performed Overall Cognitive Status: Within Functional Limits for tasks assessed                                          Exercises      General Comments        Pertinent Vitals/Pain Pain Assessment Pain Assessment: Faces Faces Pain Scale: No hurt    Home Living                          Prior Function            PT  Goals (current goals can now be found in the care plan section) Progress towards PT goals: Progressing toward goals    Frequency    Min 4X/week      PT Plan Discharge plan needs to be updated;Frequency needs to be updated    Co-evaluation              AM-PAC PT "6 Clicks" Mobility   Outcome Measure  Help needed turning from your back to your side while in a flat bed without using bedrails?: A Little Help needed moving from lying on your back to sitting on the side of a flat bed without using bedrails?: A Little Help needed moving to and from a bed to a chair (including  a wheelchair)?: A Little Help needed standing up from a chair using your arms (e.g., wheelchair or bedside chair)?: A Little Help needed to walk in hospital room?: A Little Help needed climbing 3-5 steps with a railing? : Total 6 Click Score: 16    End of Session Equipment Utilized During Treatment: Gait belt Activity Tolerance: Patient tolerated treatment well Patient left: in chair;with call bell/phone within reach   PT Visit Diagnosis: Muscle weakness (generalized) (M62.81);Difficulty in walking, not elsewhere classified (R26.2);Pain;Other symptoms and signs involving the nervous system (X79.390)     Time: 3009-2330 PT Time Calculation (min) (ACUTE ONLY): 24 min  Charges:  $Gait Training: 8-22 mins                         Faye Ramsay, PT Acute Rehabilitation  Office: [phone] Pager: [phone]

## 2022-06-14 NOTE — Plan of Care (Signed)

## 2022-06-14 NOTE — Progress Notes (Signed)
Pt up in the chair.

## 2022-06-14 NOTE — Care Management Important Message (Signed)
Important Message  Patient Details IM Letter given to the P)atient. Name: Natasha [name] MRN: [id number] Date of Birth: Nov 20, 1971   Medicare Important Message Given:  Yes     Caren Macadam 06/14/2022, 11:27 AM

## 2022-06-14 NOTE — TOC Progression Note (Signed)
Transition of Care [facility]) - Progression Note    Patient Details  Name: Natasha [name] MRN: [id number] Date of Birth: [birth year]  Transition of Care [facility]) CM/SW Contact  Otelia Santee, LCSW Phone Number: 06/14/2022, 10:57 AM  Clinical Narrative:    Pt is being recommended for inpatient psychiatric hospitalization following intentional overdose. Patient meets criteria for inpatient treatment per Caryn Bee, NP. [facility] to review pt for possible placement at their facility.  CSW faxed referrals to the following facilities for review:   [facility]   [facility]   [facility]   [facility]   [facility]   [facility]   [facility]   [facility]   [facility]   [facility]   [facility]   [facility]   [facility]   [facility]   [facility]   [facility]   [facility]   [facility]   [facility]   [facility]   [facility]   [facility]   [facility]   [facility]   [facility]   [facility]             Expected Discharge Plan: Psychiatric Hospital Barriers to Discharge: Continued Medical Work up  Expected Discharge Plan and Services Expected Discharge Plan: Psychiatric Hospital In-house Referral: Clinical Social Work Discharge Planning Services: CM Consult   Living arrangements for the past 2 months: Apartment Expected Discharge Date: 06/14/22               DME  Arranged: N/A                     Social Determinants of Health (SDOH) Interventions    Readmission Risk Interventions     No data to display

## 2022-06-14 NOTE — Progress Notes (Signed)
PROGRESS NOTE    Natasha [name]  WSF:[id number] DOB: 04-22-1971 DOA: 06/10/2022 PCP: Porfirio Oar, PA   Brief Narrative:  51 y.o. female with medical history significant of multiple suicide attempts, seizures presented with suicidal attempt after intentional benzodiazepine overdose.  EMS found her with empty bottles of Klonopin and propranolol; she received Narcan prior to EMS.  In the ED, she was maintaining her airway but was almost obtunded.  Poison control recommended supportive care, IV fluids and close monitoring of her hemodynamics. Psychiatry recommended inpatient psychiatric hospitalization once medically stable.  PT recommended SNF placement.  She is currently medically stable for discharge.   Assessment & Plan:   Intentional overdose of benzodiazepines Suicidal attempt Acute toxic encephalopathy -Patient presented with probable intentional overdose of benzodiazepines in a suicidal attempt and was almost obtunded on presentation.  She was maintaining airway. -Mental status improving, still slow to respond.  Has had episodes of intermittent agitation requiring Ativan.  Currently on room air and hemodynamically stable -Poison control recommended supportive care, IV fluids and close monitoring of her hemodynamics -Psychiatry  Psychiatry recommended inpatient psychiatric hospitalization once medically stable.  PT recommended SNF placement.  Social worker following.  She is currently medically stable for discharge.  Continue PT evaluation while inpatient. -Continue bedside sitter.     Normocytic anemia -Questionable cause.  Hemoglobin stable.   DVT prophylaxis: Lovenox Code Status: Full Family Communication: None at bedside Disposition Plan: Status is: inpatient because: Need for SNF/[facility] placement Consultants: Psychiatry  Procedures: None  Antimicrobials: None   Subjective: Patient seen and examined at bedside.  No fever, seizures, agitation  reported. Objective: Vitals:   06/13/22 0617 06/13/22 1441 06/13/22 2233 06/14/22 0528  BP: 123/86 (!) 149/92 137/88 131/90  Pulse: 75 60 63 73  Resp: 17 18 16 18   Temp: 98.8 F (37.1 C) 98.5 F (36.9 C) 98.2 F (36.8 C) 98.3 F (36.8 C)  TempSrc:  Oral Oral Oral  SpO2: 96% 96% 99% 99%    Intake/Output Summary (Last 24 hours) at 06/14/2022 0750 Last data filed at 06/13/2022 1716 Gross per 24 hour  Intake 240 ml  Output 850 ml  Net -610 ml    There were no vitals filed for this visit.  Examination:  General: On room air.  No distress.  More awake and answers some questions appropriately and feels stronger. respiratory: Decreased breath sounds at bases bilaterally with some crackles CVS: Currently rate controlled; S1-S2 heard  abdominal: Soft, nontender, slightly distended, no organomegaly; normal bowel sounds are heard  extremities: Trace lower extremity edema; no clubbing.      Data Reviewed: I have personally reviewed following labs and imaging studies  CBC: Recent Labs  Lab 06/10/22 1208 06/11/22 0923  WBC 7.5 8.4  NEUTROABS 5.5  --   HGB 11.2* 10.9*  HCT 36.7 35.5*  MCV 82.5 83.7  PLT 285 244    Basic Metabolic Panel: Recent Labs  Lab 06/10/22 1208 06/11/22 0550  NA 138 140  K 4.2 4.2  CL 107 113*  CO2 25 22  GLUCOSE 134* 81  BUN 16 17  CREATININE 0.74 0.77  CALCIUM 8.5* 8.5*    GFR: CrCl cannot be calculated (Unknown ideal weight.). Liver Function Tests: Recent Labs  Lab 06/10/22 1208 06/11/22 0550  AST 23 40  ALT 24 26  ALKPHOS 58 58  BILITOT 0.4 0.5  PROT 6.2* 6.2*  ALBUMIN 3.2* 3.2*    No results for input(s): "LIPASE", "AMYLASE" in the last 168 hours. No  results for input(s): "AMMONIA" in the last 168 hours. Coagulation Profile: No results for input(s): "INR", "PROTIME" in the last 168 hours. Cardiac Enzymes: No results for input(s): "CKTOTAL", "CKMB", "CKMBINDEX", "TROPONINI" in the last 168 hours. BNP (last 3 results) No  results for input(s): "PROBNP" in the last 8760 hours. HbA1C: No results for input(s): "HGBA1C" in the last 72 hours. CBG: Recent Labs  Lab 06/10/22 1159  GLUCAP 134*    Lipid Profile: No results for input(s): "CHOL", "HDL", "LDLCALC", "TRIG", "CHOLHDL", "LDLDIRECT" in the last 72 hours. Thyroid Function Tests: No results for input(s): "TSH", "T4TOTAL", "FREET4", "T3FREE", "THYROIDAB" in the last 72 hours. Anemia Panel: No results for input(s): "VITAMINB12", "FOLATE", "FERRITIN", "TIBC", "IRON", "RETICCTPCT" in the last 72 hours. Sepsis Labs: No results for input(s): "PROCALCITON", "LATICACIDVEN" in the last 168 hours.  No results found for this or any previous visit (from the past 240 hour(s)).       Radiology Studies: No results found.      Scheduled Meds:  diazepam  5 mg Oral Q8H   enoxaparin (LOVENOX) injection  40 mg Subcutaneous Q24H   Continuous Infusions:          Glade Lloyd, MD [facility] 06/14/2022, 7:50 AM

## 2022-06-14 NOTE — Telephone Encounter (Signed)
D:  Pt's NP Horatio Pel) returned the case manager's call from last week.  States she was off for a few days last week.  Case manager mentioned that pt had given her verbal consent to talk to her.  A:  Informed [place] about patient's discharge from [facility] and pt's OD.  According to [place], she is currently working on the paperwork to get pt started on [place].  "If pt is admitted to [facility] for ECT; that usually helps patient a lot.  I feel if she could get maintenance ECT afterwards that would really help her." Recommended that pt is in need of a DBT therapist.  Inform Hillery Jacks, NP and Everlene Balls, RN (clinical mgr) of phone conversation.  R:  Horatio Pel, NP is receptive.

## 2022-06-15 DIAGNOSIS — E669 Obesity, unspecified: Secondary | ICD-10-CM

## 2022-06-15 MED ORDER — MELATONIN 5 MG PO TABS
5.0000 mg | ORAL_TABLET | Freq: Once | ORAL | Status: AC
  Administered 2022-06-15: 5 mg via ORAL
  Filled 2022-06-15: qty 1

## 2022-06-15 MED ORDER — ACETAMINOPHEN 325 MG PO TABS
650.0000 mg | ORAL_TABLET | Freq: Four times a day (QID) | ORAL | Status: DC | PRN
  Administered 2022-06-15: 650 mg via ORAL
  Filled 2022-06-15: qty 2

## 2022-06-15 NOTE — Progress Notes (Signed)
06/15/2022  0834  Called report to [phone]. Report given to [facility].

## 2022-06-15 NOTE — Discharge Summary (Signed)
Physician Discharge Summary  Natasha [name] JME:[id number] DOB: [birth year] DOA: 06/10/2022  PCP: Porfirio Oar, PA  Admit date: 06/10/2022 Discharge date: 06/15/2022  Admitted From: Home Disposition: [facility]  Recommendations for Outpatient Follow-up:  Follow up with [facility] provider at earliest convenience on discharge   Home Health: No Equipment/Devices: None  Discharge Condition: Stable CODE STATUS: Full Diet recommendation: Regular  Brief/Interim Summary: 51 y.o. female with medical history significant of multiple suicide attempts, seizures presented with suicidal attempt after intentional benzodiazepine overdose.  EMS found her with empty bottles of Klonopin and propranolol; she received Narcan prior to EMS.  In the ED, she was maintaining her airway but was almost obtunded.  Poison control recommended supportive care, IV fluids and close monitoring of her hemodynamics.  Psychiatry recommended inpatient psychiatric hospitalization once medically stable. PT initially recommended SNF placement but on subsequent evaluation, her mobility had improved and PT did not recommend SNF placement anymore. She will be discharged to inpatient psychiatric facility once bed is available.  She is currently medically stable for discharge.      Discharge Diagnoses:   Intentional overdose of benzodiazepines Suicidal attempt Acute toxic encephalopathy -Patient presented with probable intentional overdose of benzodiazepines in a suicidal attempt and was almost obtunded on presentation.  She was maintaining airway. -Mental status improving, still slow to respond.  Has had episodes of intermittent agitation requiring Ativan.  Currently on room air and hemodynamically stable -Poison control recommended supportive care, IV fluids and close monitoring of her hemodynamics -Psychiatry  Psychiatry recommended inpatient psychiatric hospitalization once medically stable.  PT initially recommended SNF placement but  on subsequent evaluation, her mobility had improved and PT did not recommend SNF placement anymore. She will be discharged to inpatient psychiatric facility once bed is available.  She is currently medically stable for discharge.  -Continue bedside sitter.    Normocytic anemia -Questionable cause.  Hemoglobin stable.  Obesity -Outpatient follow-up   Discharge Instructions Allergies as of 06/15/2022   No Known Allergies      Medication List     STOP taking these medications    Auvelity 45-105 MG Tbcr Generic drug: Dextromethorphan-buPROPion ER   baclofen 10 MG tablet Commonly known as: LIORESAL   busPIRone 10 MG tablet Commonly known as: BUSPAR   clonazePAM 0.5 MG tablet Commonly known as: KLONOPIN   DULoxetine 60 MG capsule Commonly known as: CYMBALTA   hydrOXYzine 50 MG tablet Commonly known as: ATARAX   meloxicam 15 MG tablet Commonly known as: MOBIC   propranolol 20 MG tablet Commonly known as: INDERAL   QUEtiapine 100 MG tablet Commonly known as: SEROQUEL   traZODone 100 MG tablet Commonly known as: DESYREL       TAKE these medications    diazepam 5 MG tablet Commonly known as: VALIUM Take 1 tablet (5 mg total) by mouth every 8 (eight) hours.   famotidine 20 MG tablet Commonly known as: PEPCID Take 1 tablet (20 mg total) by mouth daily.   ibuprofen 200 MG tablet Commonly known as: ADVIL Take 800 mg by mouth every 6 (six) hours as needed for moderate pain.   SUMAtriptan 100 MG tablet Commonly known as: IMITREX Take 1 tablet (100 mg total) by mouth every 2 (two) hours as needed for migraine.         No Known Allergies  Consultations: Psychiatry   Procedures/Studies: No results found.    Subjective: Patient seen and examined at bedside.  Sitter present at bedside.  No overnight vomiting, seizures reported.  Discharge Exam: Vitals:   06/14/22 2115 06/15/22 0539  BP: (!) 130/100 104/70  Pulse: 79 80  Resp: 14 18  Temp: 97.7  F (36.5 C) 98.1 F (36.7 C)  SpO2: 100% 93%    General: No distress.  Currently on room air. Cardiovascular: rate controlled, S1/S2 + Respiratory: Decreased breath sounds at the bases bilaterally, no wheezing abdominal: Soft, obese, NT, ND, bowel sounds + Extremities: no edema, no cyanosis    The results of significant diagnostics from this hospitalization (including imaging, microbiology, ancillary and laboratory) are listed below for reference.     Microbiology: No results found for this or any previous visit (from the past 240 hour(s)).   Labs: BNP (last 3 results) No results for input(s): "BNP" in the last 8760 hours. Basic Metabolic Panel: Recent Labs  Lab 06/10/22 1208 06/11/22 0550  NA 138 140  K 4.2 4.2  CL 107 113*  CO2 25 22  GLUCOSE 134* 81  BUN 16 17  CREATININE 0.74 0.77  CALCIUM 8.5* 8.5*    Liver Function Tests: Recent Labs  Lab 06/10/22 1208 06/11/22 0550  AST 23 40  ALT 24 26  ALKPHOS 58 58  BILITOT 0.4 0.5  PROT 6.2* 6.2*  ALBUMIN 3.2* 3.2*    No results for input(s): "LIPASE", "AMYLASE" in the last 168 hours. No results for input(s): "AMMONIA" in the last 168 hours. CBC: Recent Labs  Lab 06/10/22 1208 06/11/22 0923  WBC 7.5 8.4  NEUTROABS 5.5  --   HGB 11.2* 10.9*  HCT 36.7 35.5*  MCV 82.5 83.7  PLT 285 244    Cardiac Enzymes: No results for input(s): "CKTOTAL", "CKMB", "CKMBINDEX", "TROPONINI" in the last 168 hours. BNP: Invalid input(s): "POCBNP" CBG: Recent Labs  Lab 06/10/22 1159  GLUCAP 134*    D-Dimer No results for input(s): "DDIMER" in the last 72 hours. Hgb A1c No results for input(s): "HGBA1C" in the last 72 hours. Lipid Profile No results for input(s): "CHOL", "HDL", "LDLCALC", "TRIG", "CHOLHDL", "LDLDIRECT" in the last 72 hours. Thyroid function studies No results for input(s): "TSH", "T4TOTAL", "T3FREE", "THYROIDAB" in the last 72 hours.  Invalid input(s): "FREET3" Anemia work up No results for  input(s): "VITAMINB12", "FOLATE", "FERRITIN", "TIBC", "IRON", "RETICCTPCT" in the last 72 hours. Urinalysis    Component Value Date/Time   COLORURINE YELLOW 11/02/2021 1718   APPEARANCEUR CLEAR 11/02/2021 1718   LABSPEC 1.021 11/02/2021 1718   PHURINE 5.5 11/02/2021 1718   GLUCOSEU NEGATIVE 11/02/2021 1718   HGBUR NEGATIVE 11/02/2021 1718   BILIRUBINUR NEGATIVE 11/02/2021 1718   KETONESUR 15 (A) 11/02/2021 1718   PROTEINUR NEGATIVE 11/02/2021 1718   UROBILINOGEN 1.0 06/19/2015 1532   NITRITE NEGATIVE 11/02/2021 1718   LEUKOCYTESUR NEGATIVE 11/02/2021 1718   Sepsis Labs Recent Labs  Lab 06/10/22 1208 06/11/22 0923  WBC 7.5 8.4    Microbiology No results found for this or any previous visit (from the past 240 hour(s)).   Time coordinating discharge: 35 minutes  SIGNED:   Glade Lloyd, MD  [facility] 06/15/2022, 7:46 AM

## 2022-06-15 NOTE — Progress Notes (Signed)
06/15/2022  0853 Called sheriff [phone] to arrange transport to [facility]. Left voice mail with patient information. Waiting for sheriff to call back.

## 2022-06-15 NOTE — Progress Notes (Signed)
06/15/2022  0917  Sheriff callback to setup transport. Paperwork has not been completed for [facility] to transport patient. Patient has not be served IVC paperwork. Section A incomplete on IVC papers.

## 2022-06-16 ENCOUNTER — Ambulatory Visit (HOSPITAL_COMMUNITY): Payer: 59

## 2022-06-17 ENCOUNTER — Ambulatory Visit (HOSPITAL_COMMUNITY): Payer: 59

## 2022-06-18 ENCOUNTER — Ambulatory Visit (HOSPITAL_COMMUNITY): Payer: 59

## 2022-06-21 ENCOUNTER — Ambulatory Visit (HOSPITAL_COMMUNITY): Payer: 59

## 2022-07-26 ENCOUNTER — Ambulatory Visit (HOSPITAL_COMMUNITY)
Admission: EM | Admit: 2022-07-26 | Discharge: 2022-07-26 | Disposition: A | Discharge status: Home / Self Care | Payer: 59 | Attending: Registered Nurse | Admitting: Registered Nurse

## 2022-07-26 ENCOUNTER — Encounter (HOSPITAL_COMMUNITY): Payer: Self-pay | Admitting: Registered Nurse

## 2022-07-26 ENCOUNTER — Emergency Department (EMERGENCY_DEPARTMENT_HOSPITAL)
Admission: EM | Admit: 2022-07-26 | Discharge: 2022-07-28 | Disposition: A | Discharge status: Home / Self Care | Payer: 59 | Source: Home / Self Care | Attending: Emergency Medicine | Admitting: Emergency Medicine

## 2022-07-26 ENCOUNTER — Encounter: Payer: Self-pay | Admitting: Emergency Medicine

## 2022-07-26 ENCOUNTER — Other Ambulatory Visit: Payer: Self-pay

## 2022-07-26 DIAGNOSIS — T424X1A Poisoning by benzodiazepines, accidental (unintentional), initial encounter: Secondary | ICD-10-CM | POA: Insufficient documentation

## 2022-07-26 DIAGNOSIS — F332 Major depressive disorder, recurrent severe without psychotic features: Secondary | ICD-10-CM | POA: Insufficient documentation

## 2022-07-26 DIAGNOSIS — Z9151 Personal history of suicidal behavior: Secondary | ICD-10-CM | POA: Diagnosis not present

## 2022-07-26 DIAGNOSIS — X58XXXA Exposure to other specified factors, initial encounter: Secondary | ICD-10-CM | POA: Insufficient documentation

## 2022-07-26 DIAGNOSIS — Z602 Problems related to living alone: Secondary | ICD-10-CM | POA: Insufficient documentation

## 2022-07-26 DIAGNOSIS — Y900 Blood alcohol level of less than 20 mg/100 ml: Secondary | ICD-10-CM | POA: Insufficient documentation

## 2022-07-26 DIAGNOSIS — S14109A Unspecified injury at unspecified level of cervical spinal cord, initial encounter: Secondary | ICD-10-CM | POA: Insufficient documentation

## 2022-07-26 DIAGNOSIS — R45851 Suicidal ideations: Secondary | ICD-10-CM | POA: Insufficient documentation

## 2022-07-26 DIAGNOSIS — T1491XA Suicide attempt, initial encounter: Secondary | ICD-10-CM | POA: Diagnosis present

## 2022-07-26 DIAGNOSIS — Z20822 Contact with and (suspected) exposure to covid-19: Secondary | ICD-10-CM | POA: Insufficient documentation

## 2022-07-26 DIAGNOSIS — M62838 Other muscle spasm: Secondary | ICD-10-CM | POA: Insufficient documentation

## 2022-07-26 DIAGNOSIS — T50902A Poisoning by unspecified drugs, medicaments and biological substances, intentional self-harm, initial encounter: Secondary | ICD-10-CM | POA: Diagnosis present

## 2022-07-26 DIAGNOSIS — G8220 Paraplegia, unspecified: Secondary | ICD-10-CM | POA: Diagnosis present

## 2022-07-26 LAB — COMPREHENSIVE METABOLIC PANEL
ALT: 25 U/L (ref 0–44)
AST: 24 U/L (ref 15–41)
Albumin: 4 g/dL (ref 3.5–5.0)
Alkaline Phosphatase: 72 U/L (ref 38–126)
Anion gap: 8 (ref 5–15)
BUN: 17 mg/dL (ref 6–20)
CO2: 25 mmol/L (ref 22–32)
Calcium: 9.4 mg/dL (ref 8.9–10.3)
Chloride: 106 mmol/L (ref 98–111)
Creatinine, Ser: 0.73 mg/dL (ref 0.44–1.00)
GFR, Estimated: >60 mL/min (ref >60)
Glucose, Bld: 113 mg/dL — ABNORMAL HIGH (ref 70–99)
Potassium: 4.3 mmol/L (ref 3.5–5.1)
Sodium: 139 mmol/L (ref 135–145)
Total Bilirubin: 0.5 mg/dL (ref 0.3–1.2)
Total Protein: 7.4 g/dL (ref 6.5–8.1)

## 2022-07-26 LAB — URINE DRUG SCREEN, QUALITATIVE (ARMC ONLY)
Amphetamines, Ur Screen: NOT DETECTED
Barbiturates, Ur Screen: NOT DETECTED
Benzodiazepine, Ur Scrn: POSITIVE — AB
Cannabinoid 50 Ng, Ur ~~LOC~~: NOT DETECTED
Cocaine Metabolite,Ur ~~LOC~~: NOT DETECTED
MDMA (Ecstasy)Ur Screen: NOT DETECTED
Methadone Scn, Ur: NOT DETECTED
Opiate, Ur Screen: NOT DETECTED
Phencyclidine (PCP) Ur S: NOT DETECTED
Tricyclic, Ur Screen: POSITIVE — AB

## 2022-07-26 LAB — CBC
HCT: 42.7 % (ref 36.0–46.0)
Hemoglobin: 12.9 g/dL (ref 12.0–15.0)
MCH: 25.1 pg — ABNORMAL LOW (ref 26.0–34.0)
MCHC: 30.2 g/dL (ref 30.0–36.0)
MCV: 83.1 fL (ref 80.0–100.0)
Platelets: 312 10*3/uL (ref 150–400)
RBC: 5.14 MIL/uL — ABNORMAL HIGH (ref 3.87–5.11)
RDW: 17.7 % — ABNORMAL HIGH (ref 11.5–15.5)
WBC: 7.9 10*3/uL (ref 4.0–10.5)
nRBC: 0 % (ref 0.0–0.2)

## 2022-07-26 LAB — RESP PANEL BY RT-PCR (FLU A&B, COVID) ARPGX2
Influenza A by PCR: NEGATIVE
Influenza B by PCR: NEGATIVE
SARS Coronavirus 2 by RT PCR: NEGATIVE

## 2022-07-26 LAB — ACETAMINOPHEN LEVEL: Acetaminophen (Tylenol), Serum: <10 ug/mL — ABNORMAL LOW (ref 10–30)

## 2022-07-26 LAB — SALICYLATE LEVEL: Salicylate Lvl: <7 mg/dL — ABNORMAL LOW (ref 7.0–30.0)

## 2022-07-26 LAB — ETHANOL: Alcohol, Ethyl (B): <10 mg/dL (ref <10)

## 2022-07-26 LAB — POC URINE PREG, ED: Preg Test, Ur: NEGATIVE

## 2022-07-26 NOTE — Progress Notes (Signed)
   07/26/22 1337  [facility] Triage Screening (Walk-ins at [facility] only)  How Did You Hear About [place]? Self  What Is the Reason for Your Visit/Call Today? Pt is a 51 yo female who presented voluntarily accompanied by a friend, Tish Men, who she gave verbal permission to be present and participate in the assessment. Pt stated she is looking to get ECT treatment which she has had previously at [facility]. Pt endorsed SI but stated she has no specific plan and no true intent. Hx of superficial cutting with visible healed wounds on her arm. Pt stated her last cutting episode was in June 2023 just before a suicide attempt. Pt denied HI, NSSH, AVH, paranoia and any substance use. Pt has medication management through [facility] and is on the waitlist for OP Counseling at [facility]. Pt is in a wheelchair and was observed to have limited use of her hands and arms. Pt stated that she has had a spinal cord injury at [facility].  How Long Has This Been Causing You Problems? 1 wk - 1 month  Have You Recently Had Any Thoughts About Hurting Yourself? Yes  How long ago did you have thoughts about hurting yourself? currently has SI without any plan or true intent of action  Are You Planning to Commit Suicide/Harm Yourself At This time? No  Have you Recently Had Thoughts About Hurting Someone Karolee Ohs? No  Are You Planning To Harm Someone At This Time? No  Are you currently experiencing any auditory, visual or other hallucinations? No  Have You Used Any Alcohol or Drugs in the Past 24 Hours? No  Do you have any current medical co-morbidities that require immediate attention? No  Clinician description of patient physical appearance/behavior: Pt was calm, cooperative, alert and appeared oriented. Pt did not appear to be responding to internal stimuli, experiencing delusional thinking or to be intoxicated.  Pt's speech and movement appeared within normal limits and appearance was unremarkable. Pt's mood seemed solemn and depressed,  and pt had a flat affect which was congruent. Pt's judgment and insight seemed within normal limits.  What Do You Feel Would Help You the Most Today? Treatment for Depression or other mood problem  Determination of Need Routine (7 days)   Corrie Dandy T. Jimmye Norman, MS, [facility], [facility] Triage Specialist [facility]

## 2022-07-26 NOTE — ED Notes (Signed)
Pt dressed in wine colored scrubs by this tech and Annette Stable, Charity fundraiser. Pt items placed in belongings bag are the following: Grey and blue sneakers One leg brace Pink shirt Tan bra White hat Small black bag  Pt items placed with belongings bag, labeled with pt's name: Wheelchair Black cane Black suitcase  Pt still in own navy blue scrub pants at this time. Pt calm and cooperative while getting dressed. Pt resting at this time.

## 2022-07-26 NOTE — ED Triage Notes (Signed)
Says she has been suicidal for a month ot a month and a half.  Wqas admitted and then has been worseening since she went home.

## 2022-07-26 NOTE — Consult Note (Signed)
[facility] Face-to-Face Psychiatry Consult   Reason for Consult: Psychiatric Evaluation Referring Physician: Dr. Sidney Ace Patient Identification: Natasha [name] MRN:  [id number] Principal Diagnosis: <principal problem not specified> Diagnosis:  Active Problems:   MDD (major depressive disorder), recurrent severe, without psychosis (HCC)   Paraplegia (HCC)   MDD (major depressive disorder), recurrent episode, severe (HCC)   Muscle spasticity   Spinal cord injury, cervical region (HCC)   Severe recurrent major depression without psychotic features (HCC)   Suicidal ideation   Benzodiazepine overdose   Suicidal behavior with attempted self-injury (HCC)   Suicidal overdose (HCC)   Passive suicidal ideations   Total Time spent with patient: 1 hour  Subjective: "I need to be admitted until I get my ECT  done. Natasha [name] is a 51 y.o. female patient presented to [facility] ED is voluntary. The patient was seen today at [facility]) due to her voicing she needed to be admitted until she could have her ECT done. The patient states she fears what she might do if she is not recognized until her ECT is done. The patient shared from her last visit that she attempted suicide in 2002, which left her with a spinal cord injury. The patient was last at this hospital in February and received her ECT. The patient reported that she had been experiencing worsening symptoms of depression and SI for the previous month. The patient explained that she is starting to feel acutely suicidal and is practically catatonic at home. The patient shared that she fears she might do something and hurt herself. This provider saw The patient face-to-face; the chart was reviewed, and consulted with Dr. Sidney Ace on 07/26/2022 due to the patient's care. It was discussed with the EDP that the patient remained under observation overnight and would be reassessed in the a.m. to have Dr. Toni Amend meet  with the patient to see how soon she could have her ECT done. On evaluation, the patient is alert and oriented x4, calm, depressed, cooperative, and mood-congruent with affect. The patient does not appear to be responding to internal or external stimuli. Neither is the patient presenting with any delusional thinking. The patient denies auditory or visual hallucinations. The patient admits to suicidal ideation with a plan to harm herself but denies homicidal ideations. The patient is not presenting with any psychotic or paranoid behaviors.  HPI:    Past Psychiatric History:  Anxiety Depression OCD (obsessive compulsive disorder) PTSD (post-traumatic stress disorder)  Risk to Self:   Risk to Others:   Prior Inpatient Therapy:   Prior Outpatient Therapy:    Past Medical History:  Past Medical History:  Diagnosis Date   Anxiety    C6 spinal cord injury (HCC)    Depression    OCD (obsessive compulsive disorder)    PTSD (post-traumatic stress disorder)     Past Surgical History:  Procedure Laterality Date   BACK SURGERY     NECK SURGERY     Family History:  Family History  Problem Relation Age of Onset   Alcohol abuse Father    Alcohol abuse Brother    Depression Mother    Alcohol abuse Mother    Parkinson's disease Mother    Family Psychiatric  History:  Social History:  Social History   Substance and Sexual Activity  Alcohol Use Yes   Alcohol/week: 0.0 standard drinks of alcohol   Comment: Occasional use     Social History   Substance and Sexual Activity  Drug  Use No    Social History   Socioeconomic History   Marital status: Single    Spouse name: Not on file   Number of children: Not on file   Years of education: Not on file   Highest education level: Not on file  Occupational History   Occupation: Disability  Tobacco Use   Smoking status: Former    Packs/day: 0.00    Types: Cigarettes   Smokeless tobacco: Never  Vaping Use   Vaping Use: Never used   Substance and Sexual Activity   Alcohol use: Yes    Alcohol/week: 0.0 standard drinks of alcohol    Comment: Occasional use   Drug use: No   Sexual activity: Not Currently  Other Topics Concern   Not on file  Social History Narrative   Pt lives alone; receives outpatient psychiatry services through [facility].  Pt is on disability due to a spinal cord injury.    Social Determinants of Health   Financial Resource Strain: Not on file  Food Insecurity: Not on file  Transportation Needs: Not on file  Physical Activity: Not on file  Stress: Not on file  Social Connections: Not on file   Additional Social History:    Allergies:  No Known Allergies  Labs:  Results for orders placed or performed during the hospital encounter of 07/26/22 (from the past 48 hour(s))  Comprehensive metabolic panel     Status: Abnormal   Collection Time: 07/26/22  4:12 PM  Result Value Ref Range   Sodium 139 135 - 145 mmol/L   Potassium 4.3 3.5 - 5.1 mmol/L   Chloride 106 98 - 111 mmol/L   CO2 25 22 - 32 mmol/L   Glucose, Bld 113 (H) 70 - 99 mg/dL    Comment: Glucose reference range applies only to samples taken after fasting for at least 8 hours.   BUN 17 6 - 20 mg/dL   Creatinine, Ser 0.35 0.44 - 1.00 mg/dL   Calcium 9.4 8.9 - 00.9 mg/dL   Total Protein 7.4 6.5 - 8.1 g/dL   Albumin 4.0 3.5 - 5.0 g/dL   AST 24 15 - 41 U/L   ALT 25 0 - 44 U/L   Alkaline Phosphatase 72 38 - 126 U/L   Total Bilirubin 0.5 0.3 - 1.2 mg/dL   GFR, Estimated >38 >18 mL/min    Comment: (NOTE) Calculated using the CKD-EPI Creatinine Equation (2021)    Anion gap 8 5 - 15    Comment: Performed at [facility], [address] [place], [place] [address]  Ethanol     Status: None   Collection Time: 07/26/22  4:12 PM  Result Value Ref Range   Alcohol, Ethyl (B) <10 <10 mg/dL    Comment: (NOTE) Lowest detectable limit for serum alcohol is 10 mg/dL.  For medical purposes only. Performed at [facility], [address] [place], [place] [address]   Salicylate level     Status: Abnormal   Collection Time: 07/26/22  4:12 PM  Result Value Ref Range   Salicylate Lvl <7.0 (L) 7.0 - 30.0 mg/dL    Comment: Performed at [facility], [address] [place], [place] [address]  Acetaminophen level     Status: Abnormal   Collection Time: 07/26/22  4:12 PM  Result Value Ref Range   Acetaminophen (Tylenol), Serum <10 (L) 10 - 30 ug/mL    Comment: (NOTE) Therapeutic concentrations vary significantly. A range of 10-30 ug/mL  may  be an effective concentration for many patients. However, some  are best treated at concentrations outside of this range. Acetaminophen concentrations >150 ug/mL at 4 hours after ingestion  and >50 ug/mL at 12 hours after ingestion are often associated with  toxic reactions.  Performed at [facility], [address] [place], [place] [address]   cbc     Status: Abnormal   Collection Time: 07/26/22  4:12 PM  Result Value Ref Range   WBC 7.9 4.0 - 10.5 K/uL   RBC 5.14 (H) 3.87 - 5.11 MIL/uL   Hemoglobin 12.9 12.0 - 15.0 g/dL   HCT 32.6 71.2 - 45.8 %   MCV 83.1 80.0 - 100.0 fL   MCH 25.1 (L) 26.0 - 34.0 pg   MCHC 30.2 30.0 - 36.0 g/dL   RDW 09.9 (H) 83.3 - 82.5 %   Platelets 312 150 - 400 K/uL   nRBC 0.0 0.0 - 0.2 %    Comment: Performed at [facility], [address] [place], [place] [address]  Urine Drug Screen, Qualitative     Status: Abnormal   Collection Time: 07/26/22  4:54 PM  Result Value Ref Range   Tricyclic, Ur Screen POSITIVE (A) NONE DETECTED   Amphetamines, Ur Screen NONE DETECTED NONE DETECTED   MDMA (Ecstasy)Ur Screen NONE DETECTED NONE DETECTED   Cocaine Metabolite,Ur [place] NONE DETECTED NONE DETECTED   Opiate, Ur Screen NONE DETECTED NONE DETECTED   Phencyclidine (PCP) Ur S NONE DETECTED NONE DETECTED   Cannabinoid 50 Ng, Ur [place] NONE DETECTED NONE DETECTED   Barbiturates, Ur Screen NONE DETECTED NONE  DETECTED   Benzodiazepine, Ur Scrn POSITIVE (A) NONE DETECTED   Methadone Scn, Ur NONE DETECTED NONE DETECTED    Comment: (NOTE) Tricyclics + metabolites, urine    Cutoff 1000 ng/mL Amphetamines + metabolites, urine  Cutoff 1000 ng/mL MDMA (Ecstasy), urine              Cutoff 500 ng/mL Cocaine Metabolite, urine          Cutoff 300 ng/mL Opiate + metabolites, urine        Cutoff 300 ng/mL Phencyclidine (PCP), urine         Cutoff 25 ng/mL Cannabinoid, urine                 Cutoff 50 ng/mL Barbiturates + metabolites, urine  Cutoff 200 ng/mL Benzodiazepine, urine              Cutoff 200 ng/mL Methadone, urine                   Cutoff 300 ng/mL  The urine drug screen provides only a preliminary, unconfirmed analytical test result and should not be used for non-medical purposes. Clinical consideration and professional judgment should be applied to any positive drug screen result due to possible interfering substances. A more specific alternate chemical method must be used in order to obtain a confirmed analytical result. Gas chromatography / mass spectrometry (GC/MS) is the preferred confirm atory method. Performed at [facility], [address] [place], [place] [address]   POC urine preg, ED     Status: None   Collection Time: 07/26/22  4:57 PM  Result Value Ref Range   Preg Test, Ur NEGATIVE NEGATIVE    Comment:        THE SENSITIVITY OF THIS METHODOLOGY IS >24 mIU/mL   Resp Panel by RT-PCR (Flu A&B, Covid) Anterior Nasal Swab     Status:  None   Collection Time: 07/26/22  6:30 PM   Specimen: Anterior Nasal Swab  Result Value Ref Range   SARS Coronavirus 2 by RT PCR NEGATIVE NEGATIVE    Comment: (NOTE) SARS-CoV-2 target nucleic acids are NOT DETECTED.  The SARS-CoV-2 RNA is generally detectable in upper respiratory specimens during the acute phase of infection. The lowest concentration of SARS-CoV-2 viral copies this assay can detect is 138 copies/mL. A negative  result does not preclude SARS-Cov-2 infection and should not be used as the sole basis for treatment or other patient management decisions. A negative result may occur with  improper specimen collection/handling, submission of specimen other than nasopharyngeal swab, presence of viral mutation(s) within the areas targeted by this assay, and inadequate number of viral copies(<138 copies/mL). A negative result must be combined with clinical observations, patient history, and epidemiological information. The expected result is Negative.  Fact Sheet for Patients:  BloggerCourse.comhttps://www.fda.gov/media/152166/download  Fact Sheet for Healthcare Providers:  SeriousBroker.ithttps://www.fda.gov/media/152162/download  This test is no t yet approved or cleared by the [place] FDA and  has been authorized for detection and/or diagnosis of SARS-CoV-2 by FDA under an Emergency Use Authorization (EUA). This EUA will remain  in effect (meaning this test can be used) for the duration of the COVID-19 declaration under Section 564(b)(1) of the Act, 21 U.S.C.section 360bbb-3(b)(1), unless the authorization is terminated  or revoked sooner.       Influenza A by PCR NEGATIVE NEGATIVE   Influenza B by PCR NEGATIVE NEGATIVE    Comment: (NOTE) The Xpert Xpress SARS-CoV-2/FLU/RSV plus assay is intended as an aid in the diagnosis of influenza from Nasopharyngeal swab specimens and should not be used as a sole basis for treatment. Nasal washings and aspirates are unacceptable for Xpert Xpress SARS-CoV-2/FLU/RSV testing.  Fact Sheet for Patients: BloggerCourse.comhttps://www.fda.gov/media/152166/download  Fact Sheet for Healthcare Providers: SeriousBroker.ithttps://www.fda.gov/media/152162/download  This test is not yet approved or cleared by the [place] FDA and has been authorized for detection and/or diagnosis of SARS-CoV-2 by FDA under an Emergency Use Authorization (EUA). This EUA will remain in effect (meaning this test can be used) for the  duration of the COVID-19 declaration under Section 564(b)(1) of the Act, 21 U.S.C. section 360bbb-3(b)(1), unless the authorization is terminated or revoked.  Performed at [facility], [address] [place], [place] [id number]     No current facility-administered medications for this encounter.   Current Outpatient Medications  Medication Sig Dispense Refill   AUVELITY 45-105 MG TBCR Take 1 tablet by mouth 2 (two) times daily.     baclofen (LIORESAL) 10 MG tablet Take 10-20 mg by mouth 3 (three) times daily.     busPIRone (BUSPAR) 10 MG tablet Take 10 mg by mouth 3 (three) times daily.     DULoxetine (CYMBALTA) 60 MG capsule Take 120 mg by mouth daily.     famotidine (PEPCID) 20 MG tablet Take 1 tablet (20 mg total) by mouth daily. 30 tablet 1   QUEtiapine (SEROQUEL) 100 MG tablet Take 100 mg by mouth at bedtime.     clonazePAM (KLONOPIN) 0.5 MG tablet Take 0.5 mg by mouth 2 (two) times daily as needed.     SUMAtriptan (IMITREX) 100 MG tablet Take 1 tablet (100 mg total) by mouth every 2 (two) hours as needed for migraine. 10 tablet 1    Musculoskeletal: Strength & Muscle Tone: spastic and decreased Gait & Station: unable to stand Patient leans: Front  Psychiatric Specialty Exam:  Presentation  General Appearance:  Appropriate for Environment  Eye Contact:Good  Speech:Clear and Coherent; Normal Rate  Speech Volume:Normal  Handedness:Right   Mood and Affect  Mood:Depressed  Affect:Congruent; Depressed; Flat; Blunt   Thought Process  Thought Processes:Coherent; Goal Directed  Descriptions of Associations:Intact  Orientation:Full (Time, Place and Person)  Thought Content:Logical  History of Schizophrenia/Schizoaffective disorder:No  Duration of Psychotic Symptoms:No data recorded Hallucinations:Hallucinations: None  Ideas of Reference:None  Suicidal Thoughts:Suicidal Thoughts: Yes, Active SI Active Intent and/or Plan: With Intent; With Plan;  With Means to Carry Out; With Access to Means SI Passive Intent and/or Plan: Without Intent; Without Plan  Homicidal Thoughts:Homicidal Thoughts: No   Sensorium  Memory:Immediate Good; Recent Good; Remote Good  Judgment:Intact  Insight:Poor   Executive Functions  Concentration:Good  Attention Span:Good  Recall:Good  Fund of Knowledge:Good  Language:Good   Psychomotor Activity  Psychomotor Activity:Psychomotor Activity: Normal   Assets  Assets:Communication Skills; Desire for Improvement; Resilience   Sleep  Sleep:Sleep: Good Number of Hours of Sleep: 8   Physical Exam: Physical Exam Vitals and nursing note reviewed.  Constitutional:      Appearance: Normal appearance.  HENT:     Head: Normocephalic and atraumatic.  Cardiovascular:     Rate and Rhythm: Normal rate.     Pulses: Normal pulses.  Pulmonary:     Effort: Pulmonary effort is normal.  Musculoskeletal:        General: Normal range of motion.     Cervical back: Normal range of motion and neck supple.  Neurological:     General: No focal deficit present.     Mental Status: She is alert and oriented to person, place, and time.  Psychiatric:        Attention and Perception: Attention and perception normal.        Mood and Affect: Mood is depressed. Affect is blunt and flat.        Speech: Speech normal.        Behavior: Behavior normal. Behavior is cooperative.        Thought Content: Thought content includes suicidal ideation. Thought content includes suicidal plan.        Cognition and Memory: Cognition and memory normal.        Judgment: Judgment is impulsive and inappropriate.    Review of Systems  Psychiatric/Behavioral:  Positive for depression and suicidal ideas.   All other systems reviewed and are negative.  Blood pressure (!) 151/93, pulse 79, temperature 98.2 F (36.8 C), resp. rate 16, weight 77.1 kg, SpO2 100 %. Body mass index is 33.2 kg/m.  Treatment Plan Summary: Daily  contact with patient to assess and evaluate symptoms and progress in treatment and Plan The patient remained under observation overnight and will be reassessed in the a.m. to have Dr. Toni Amend meet with the patient to see how soon she can have her ECT done.  Disposition: Supportive therapy provided about ongoing stressors. The patient remained under observation overnight and will be reassessed in the a.m. to have Dr. Toni Amend meet with the patient to see how soon she can have her ECT done.  Gillermo Murdoch, NP 07/26/2022 10:55 PM

## 2022-07-26 NOTE — ED Notes (Signed)
Pt awake, depressed affect. Pt with hx suicide attempts x 3. Pt contracts for safety at this time. Call bell within reach. Bed in low position. Q15 minute safety rounds, rover and security present.
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
## 2022-07-26 NOTE — ED Provider Notes (Signed)
[facility]al [facility] Medical Screening Exam  Patient Name: Natasha [name] MRN: [id number] Date of Evaluation: 07/26/22 Chief Complaint:   Diagnosis:  Final diagnoses:  MDD (major depressive disorder), recurrent severe, without psychosis (HCC)  Passive suicidal ideations    History of Present illness: Natasha [name] is a 51 y.o. female patient presented to [facility] as a walk in accompanied by a friend with complaints of worsening depression and passive suicidal thoughts  Natasha [name], 51 y.o., female patient seen face to face by this provider, consulted with Dr. Nelly Rout; and chart reviewed on 07/26/22.  On evaluation Natasha [name] reports she has outpatient psychiatric services with [facility] and she recent discharge from psychiatric hospitalization related to suicide attempt.  States she has also finished intensive outpatient program and recommend for higher level of care.  States she has had ECT in the past and it really worked for her.  States it is "about the only thing that really works."  Report depression usually controlled "with usual means for about 6 months to a year.  They recommend that I do it on a outpatient but I don't have transportation or anyone to pick me up and they don't like to discharged to a transportation services.  I usually have to do it inpatient."  At this time patient states that her depression is worsening and she is currently having passive suicidal thoughts with no intent or plan but "I want to get some help before it gets to that point."  Patient also denies self-harm/homicidal ideation, psychosis, and paranoia.  Patient states that she lives alone but does have support.   During evaluation Natasha [name] is sitting up right in wheelchair with no noted distress.  She is alert/oriented x 4; calm/cooperative.  He mood is dysphoric with congruent affect.  She is speaking in a clear tone at moderate  volume, and normal pace; with good eye contact.  Her thought process is coherent and relevant; There is no indication that she is currently responding to internal/external stimuli or experiencing delusional thought content.  She denies self-harm/homicidal ideation, psychosis, and paranoia.  She does endorse passive suicidal thoughts with no intent or plan at this time and is able tor contract for safety; however she does have a history of prior suicide attempt and was recently discharged from psychiatric hospitalization related to suicide attempt via overdose.  Patient has remained calm throughout assessment and has answered questions appropriately.    Secure message sent to Dr. Toni Amend asking about ECT:  This patient is at [facility] as a walk in and wanting to get ECT she was recently discharged from psychiatric hospitalization related to overdose and has completed IOP and referred to higher level of care.  States that ECT is the only thing that really helps and usually last 6 months to a year.  Last Had ECT summer of 2021.  What does she need to do to get back into ECT if she has to have it in patient.  Would she just come to [facility] or would it have to be arranged for her.  Social work/TOC also working on setting up an appointment or referral for patient.      Psychiatric Specialty Exam  Presentation  General Appearance:Appropriate for Environment  Eye Contact:Good  Speech:Clear and Coherent; Normal Rate  Speech Volume:Normal  Handedness:Right   Mood and Affect  Mood:Depressed  Affect:Congruent; Depressed   Thought Process  Thought Processes:Coherent; Goal Directed  Descriptions of Associations:Intact  Orientation:Full (Time, Place and Person)  Thought Content:Logical  Diagnosis of Schizophrenia or Schizoaffective disorder in past: No   Hallucinations:None  Ideas of Reference:None  Suicidal Thoughts:Yes, Passive With Intent; With Plan; With Means to Carry Out; With Access to  Means Without Intent; Without Plan  Homicidal Thoughts:No   Sensorium  Memory:Immediate Good; Recent Good; Remote Good  Judgment:Intact  Insight:Present   Executive Functions  Concentration:Good  Attention Span:Good  Recall:Good  Fund of Knowledge:Good  Language:Good   Psychomotor Activity  Psychomotor Activity:Normal   Assets  Assets:Communication Skills; Desire for Improvement; Financial Resources/Insurance; Housing; Resilience; Social Support   Sleep  Sleep:Good  Number of hours: 8   Nutritional Assessment (For OBS and FBC admissions only) Has the patient had a weight loss or gain of 10 pounds or more in the last 3 months?: No Has the patient had a decrease in food intake/or appetite?: No Does the patient have dental problems?: No Does the patient have eating habits or behaviors that may be indicators of an eating disorder including binging or inducing vomiting?: No Has the patient recently lost weight without trying?: 0 Has the patient been eating poorly because of a decreased appetite?: 0 Malnutrition Screening Tool Score: 0    Physical Exam: Physical Exam Vitals and nursing note reviewed. Exam conducted with a chaperone present.  Constitutional:      General: She is not in acute distress.    Appearance: Normal appearance. She is not ill-appearing.  Cardiovascular:     Rate and Rhythm: Normal rate.  Pulmonary:     Effort: Pulmonary effort is normal.  Skin:    General: Skin is warm and dry.  Neurological:     Mental Status: She is alert and oriented to person, place, and time.  Psychiatric:        Attention and Perception: Attention and perception normal. She does not perceive auditory or visual hallucinations.        Mood and Affect: Mood is depressed.        Speech: Speech normal.        Behavior: Behavior normal. Behavior is cooperative.        Thought Content: Thought content is not paranoid or delusional. Thought content does not include  homicidal ideation. Suicidal: Passive suicidal thoughts.Thought content does not include suicidal plan.        Cognition and Memory: Cognition normal.        Judgment: Judgment is impulsive.   Review of Systems  Constitutional: Negative.   Respiratory: Negative.    Cardiovascular: Negative.   Gastrointestinal: Negative.   Skin: Negative.   Endo/Heme/Allergies: Negative.   Psychiatric/Behavioral:  Positive for depression (Worsening depression). Hallucinations: Denies. Substance abuse: Denies. Suicidal ideas: Passive suicidal thoughts with no intent or plan at this time.The patient does not have insomnia. Nervous/anxious: Stable.   There were no vitals taken for this visit. There is no height or weight on file to calculate BMI.  Musculoskeletal: Strength & Muscle Tone: within normal limits Gait & Station:  Wheelchair Patient leans: N/A   [facility] MSE Discharge Disposition for Follow up and Recommendations: Based on my evaluation the patient does not appear to have an emergency medical condition and can be discharged with resources and follow up care in outpatient services for Medication Management, Partial Hospitalization Program, and Individual Therapy     Discharge Instructions      Good afternoon, [place]!  An appointment was made at Dr. Toni Amend office for 29 July 2022 @ 1:20pm.  Dr. Toni Amend  will contact you for an assessment to make sure this is still a suitable treatment for you.  Please, call the office to confirm a good number for him to call you on.  This appointment is over the phone and only for him to speak with you.  From there, he will make an appointment for you if you are still eligible for ECT.       Follow-up Information     [facility]. Call on 07/29/2022.   Why: Dr. Weber Cooks office from [place] will call you at 1:20pm on 29 July 2022.  Be sure to call the office between now and then to confirm what number you can be reached on so you  will not miss the appointment.  The phone call is only for Dr. Weber Cooks to speak with you for an updated assessment of the presenting issue.  From there, you should be scheduled for another appointment. Contact information: [place] [id number] [place] [phone]                 Earleen Newport, NP 07/26/2022, 1:42 PM

## 2022-07-26 NOTE — ED Notes (Signed)
Patient refused snacks/drink.

## 2022-07-26 NOTE — ED Provider Notes (Signed)
[facility] Provider Note    Event Date/Time   First MD Initiated Contact with Patient 07/26/22 1643     (approximate)   History   Psychiatric Evaluation   HPI  Natasha [name] is a 51 y.o. female past medical history of suicidal ideation and major depressive disorder, PTSD OCD who presents with suicidal ideation.  She notes that she has been feeling quite depressed for the last several months.  She is having thoughts of suicide tells me she has thought of several things but has no active plan currently.  Tells me that she has had several episodes similarly in the past where she becomes quite depressed and this is responded to electroconvulsive therapy in the past which she is interested in.  She lives alone.  Denies any drug or alcohol use denies any medical issues today.     Past Medical History:  Diagnosis Date   Anxiety    C6 spinal cord injury (HCC)    Depression    OCD (obsessive compulsive disorder)    PTSD (post-traumatic stress disorder)     Patient Active Problem List   Diagnosis Date Noted   Passive suicidal ideations 07/26/2022   Obesity (BMI 30-39.9) 06/15/2022   Suicidal behavior with attempted self-injury (HCC) 06/11/2022   Acute encephalopathy 06/11/2022   Suicidal overdose (HCC) 06/11/2022   Benzodiazepine overdose 06/10/2022   Suicidal ideation 01/26/2022   Leg weakness, bilateral 11/03/2021   Severe recurrent major depression without psychotic features (HCC) 10/16/2020   MDD (major depressive disorder), recurrent episode, severe (HCC) 10/15/2020   Right foot drop 10/13/2020   Muscle spasticity 01/29/2020   History of DVT (deep vein thrombosis) 05/11/2019   Spinal cord injury, cervical region (HCC) 05/11/2019   Paraplegia (HCC) 10/21/2017   MDD (major depressive disorder), recurrent severe, without psychosis (HCC) 02/02/2017     Physical Exam  Triage Vital Signs: ED Triage Vitals [07/26/22 1540]  Enc Vitals Group      BP (!) 150/99     Pulse Rate 88     Resp 16     Temp 98.1 F (36.7 C)     Temp Source Oral     SpO2 100 %     Weight 170 lb (77.1 kg)     Height      Head Circumference      Peak Flow      Pain Score 4     Pain Loc      Pain Edu?      Excl. in GC?     Most recent vital signs: Vitals:   07/26/22 1540 07/26/22 2007  BP: (!) 150/99 (!) 151/93  Pulse: 88 79  Resp: 16 16  Temp: 98.1 F (36.7 C) 98.2 F (36.8 C)  SpO2: 100% 100%     General: Awake, no distress.  CV:  Good peripheral perfusion.  Resp:  Normal effort.  Abd:  No distention.  Neuro:             Awake, Alert, Oriented x 3  Other:  Patient is calm and cooperative has good insight no agitation   ED Results / Procedures / Treatments  Labs (all labs ordered are listed, but only abnormal results are displayed) Labs Reviewed  COMPREHENSIVE METABOLIC PANEL - Abnormal; Notable for the following components:      Result Value   Glucose, Bld 113 (*)    All other components within normal limits  SALICYLATE LEVEL - Abnormal; Notable for the following  components:   Salicylate Lvl <7.0 (*)    All other components within normal limits  ACETAMINOPHEN LEVEL - Abnormal; Notable for the following components:   Acetaminophen (Tylenol), Serum <10 (*)    All other components within normal limits  CBC - Abnormal; Notable for the following components:   RBC 5.14 (*)    MCH 25.1 (*)    RDW 17.7 (*)    All other components within normal limits  URINE DRUG SCREEN, QUALITATIVE ([facility] ONLY) - Abnormal; Notable for the following components:   Tricyclic, Ur Screen POSITIVE (*)    Benzodiazepine, Ur Scrn POSITIVE (*)    All other components within normal limits  RESP PANEL BY RT-PCR (FLU A&B, COVID) ARPGX2  ETHANOL  POC URINE PREG, ED  POC URINE PREG, ED     EKG    RADIOLOGY    PROCEDURES:  Critical Care performed: No  Procedures   MEDICATIONS ORDERED IN ED: Medications - No data to display   IMPRESSION  / MDM / ASSESSMENT AND PLAN / ED COURSE  I reviewed the triage vital signs and the nursing notes.                              Patient's presentation is most consistent with acute presentation with potential threat to life or bodily function.  Differential diagnosis includes, but is not limited to, major depressive disorder, adjustment disorder, substance-induced mood disorder  Patient is a 51 year old female with history of major depressive disorder presenting with suicidal ideation.  Has been feeling depressed for several weeks with passive thoughts of SI.  She went to behavioral health urgent care today and was discharged but she presents today because she would really like to see Dr. Toni Amend to discuss ECT.  Patient does have a history of self-harm but has no active plan currently.  She has no other medical complaints.  Vitals are notable for just mild hypertension.  CBC CMP Tylenol salicylate ethanol all levels all reassuring.  Psychiatry has seen the patient and they recommend observing her into the a.m. to see whether they can arrange a plan for ECT and to have her see Dr. Toni Amend as an inpatient versus outpatient.  Patient here voluntarily do not think she needs IVC currently.  The patient has been placed in psychiatric observation due to the need to provide a safe environment for the patient while obtaining psychiatric consultation and evaluation, as well as ongoing medical and medication management to treat the patient's condition.  The patient has not been placed under full IVC at this time.        FINAL CLINICAL IMPRESSION(S) / ED DIAGNOSES   Final diagnoses:  Suicidal ideation     Rx / DC Orders   ED Discharge Orders     None        Note:  This document was prepared using Dragon voice recognition software and may include unintentional dictation errors.   Georga Hacking, MD 07/27/22 [phone]

## 2022-07-26 NOTE — Discharge Instructions (Addendum)
Good afternoon, Natasha [name]!  An appointment was made at Dr. Toni Amend office for 29 July 2022 @ 1:20pm.  Dr. Toni Amend will contact you for an assessment to make sure this is still a suitable treatment for you.  Please, call the office to confirm a good number for him to call you on.  This appointment is over the phone and only for him to speak with you.  From there, he will make an appointment for you if you are still eligible for ECT.

## 2022-07-26 NOTE — ED Notes (Signed)
Vol pending consult 

## 2022-07-26 NOTE — BH Assessment (Signed)
LCSW Progress Note   Per Shuvon Rankin, NP, this pt does not require psychiatric hospitalization at this time.  Pt is psychiatrically cleared.  Discharge instructions include appointment information for ECT at the [facility].  EDP Shuvon Rankin, NP, has been notified.  [name], MSW, LCSW [facility] [phone] or [phone]

## 2022-07-26 NOTE — ED Notes (Signed)
Pt awake, flat affect, denies needs or wants. Q15 minute safety rounds, rover and security present.

## 2022-07-27 DIAGNOSIS — F332 Major depressive disorder, recurrent severe without psychotic features: Secondary | ICD-10-CM

## 2022-07-27 MED ORDER — BUSPIRONE HCL 5 MG PO TABS
10.0000 mg | ORAL_TABLET | Freq: Three times a day (TID) | ORAL | Status: DC
  Administered 2022-07-27 – 2022-07-28 (×4): 10 mg via ORAL
  Filled 2022-07-27 (×4): qty 2

## 2022-07-27 MED ORDER — QUETIAPINE FUMARATE 25 MG PO TABS
100.0000 mg | ORAL_TABLET | Freq: Every day | ORAL | Status: DC
  Administered 2022-07-27: 100 mg via ORAL
  Filled 2022-07-27: qty 4

## 2022-07-27 MED ORDER — BACLOFEN 10 MG PO TABS
10.0000 mg | ORAL_TABLET | Freq: Three times a day (TID) | ORAL | Status: DC
  Administered 2022-07-27 – 2022-07-28 (×4): 10 mg via ORAL
  Filled 2022-07-27 (×5): qty 1

## 2022-07-27 MED ORDER — SUMATRIPTAN SUCCINATE 50 MG PO TABS
100.0000 mg | ORAL_TABLET | ORAL | Status: DC | PRN
Start: 2022-07-27 — End: 2022-07-28

## 2022-07-27 MED ORDER — FAMOTIDINE 20 MG PO TABS
20.0000 mg | ORAL_TABLET | Freq: Every day | ORAL | Status: DC
  Administered 2022-07-27 – 2022-07-28 (×2): 20 mg via ORAL
  Filled 2022-07-27 (×2): qty 1

## 2022-07-27 MED ORDER — CLONAZEPAM 0.5 MG PO TABS
0.5000 mg | ORAL_TABLET | Freq: Two times a day (BID) | ORAL | Status: DC | PRN
  Administered 2022-07-27 – 2022-07-28 (×3): 0.5 mg via ORAL
  Filled 2022-07-27 (×3): qty 1

## 2022-07-27 MED ORDER — DULOXETINE HCL 60 MG PO CPEP
120.0000 mg | ORAL_CAPSULE | Freq: Every day | ORAL | Status: DC
  Administered 2022-07-27 – 2022-07-28 (×2): 120 mg via ORAL
  Filled 2022-07-27 (×2): qty 2

## 2022-07-27 NOTE — ED Notes (Signed)
Pt refused nighttime snack. 

## 2022-07-27 NOTE — Discharge Instructions (Addendum)
Call Dr. Shary Key office [phone] to confirm phone consultation for Thursday at 1:20. Make sure they have your contact information.

## 2022-07-27 NOTE — ED Notes (Signed)
Patient refused breakfast at this time. Pt states she doesn't feel well. RN made aware.

## 2022-07-27 NOTE — ED Notes (Signed)
This RN called pt's requested contact for d/c. No answer at this time. This RN left voicemail for callback

## 2022-07-27 NOTE — ED Notes (Signed)
Pt assisted to the bathroom. This tech assisted pt to wash up. Pt changed into new hospital provided scrub top.

## 2022-07-27 NOTE — ED Notes (Addendum)
This RN on phone with pt's requested contact, Tish Men. Friend expressing concerns of pt being d/c to early and not getting ECT. This RN explaining pt has been cleared by psych and care plan has been agreed to be ongoing in an outpatient setting. NP aware of friend's concern and NP given number for contact.  Friend further states she would be unable to pick pt up pt today and earliest would be tomorrow morning. Friend gave this RN another number to call of friend that is local, Gar Gibbon [phone]. This Rn awaiting instructions from psych NP.

## 2022-07-27 NOTE — ED Provider Notes (Incomplete)
[facility] Provider Note    Event Date/Time   First MD Initiated Contact with Patient 07/26/22 1643     (approximate)   History   Psychiatric Evaluation   HPI  Natasha [name] is a 51 y.o. female past medical history of suicidal ideation and major depressive disorder, PTSD OCD who presents with suicidal ideation.  She notes that she has been feeling quite depressed for the last several months.  She is having thoughts of suicide tells me she has thought of several things but has no active plan currently.  Tells me that she has had several episodes similarly in the past where she becomes quite depressed and this is responded to electroconvulsive therapy in the past which she is interested in.  She lives alone.  Denies any drug or alcohol use denies any medical issues today.     Past Medical History:  Diagnosis Date  . Anxiety   . C6 spinal cord injury (HCC)   . Depression   . OCD (obsessive compulsive disorder)   . PTSD (post-traumatic stress disorder)     Patient Active Problem List   Diagnosis Date Noted  . Passive suicidal ideations 07/26/2022  . Obesity (BMI 30-39.9) 06/15/2022  . Suicidal behavior with attempted self-injury (HCC) 06/11/2022  . Acute encephalopathy 06/11/2022  . Suicidal overdose (HCC) 06/11/2022  . Benzodiazepine overdose 06/10/2022  . Suicidal ideation 01/26/2022  . Leg weakness, bilateral 11/03/2021  . Severe recurrent major depression without psychotic features (HCC) 10/16/2020  . MDD (major depressive disorder), recurrent episode, severe (HCC) 10/15/2020  . Right foot drop 10/13/2020  . Muscle spasticity 01/29/2020  . History of DVT (deep vein thrombosis) 05/11/2019  . Spinal cord injury, cervical region (HCC) 05/11/2019  . Paraplegia (HCC) 10/21/2017  . MDD (major depressive disorder), recurrent severe, without psychosis (HCC) 02/02/2017     Physical Exam  Triage Vital Signs: ED Triage Vitals [07/26/22  1540]  Enc Vitals Group     BP (!) 150/99     Pulse Rate 88     Resp 16     Temp 98.1 F (36.7 C)     Temp Source Oral     SpO2 100 %     Weight 170 lb (77.1 kg)     Height      Head Circumference      Peak Flow      Pain Score 4     Pain Loc      Pain Edu?      Excl. in GC?     Most recent vital signs: Vitals:   07/26/22 1540  BP: (!) 150/99  Pulse: 88  Resp: 16  Temp: 98.1 F (36.7 C)  SpO2: 100%     General: Awake, no distress. *** CV:  Good peripheral perfusion. *** Resp:  Normal effort. *** Abd:  No distention. *** Neuro:             Awake, Alert, Oriented x 3 *** Other:  ***   ED Results / Procedures / Treatments  Labs (all labs ordered are listed, but only abnormal results are displayed) Labs Reviewed  COMPREHENSIVE METABOLIC PANEL - Abnormal; Notable for the following components:      Result Value   Glucose, Bld 113 (*)    All other components within normal limits  CBC - Abnormal; Notable for the following components:   RBC 5.14 (*)    MCH 25.1 (*)    RDW 17.7 (*)  All other components within normal limits  ETHANOL  SALICYLATE LEVEL  ACETAMINOPHEN LEVEL  URINE DRUG SCREEN, QUALITATIVE ([facility] ONLY)  POC URINE PREG, ED     EKG  ***   RADIOLOGY ***   PROCEDURES:  Critical Care performed: {CriticalCareYesNo:19197::"Yes, see critical care procedure note(s)","No"}  Procedures {If the patient is on the monitor, remove the brackets and asterisks. Otherwise delete the sentence below:1} The patient is on the cardiac monitor to evaluate for evidence of arrhythmia and/or significant heart rate changes.   MEDICATIONS ORDERED IN ED: Medications - No data to display   IMPRESSION / MDM / ASSESSMENT AND PLAN / ED COURSE  I reviewed the triage vital signs and the nursing notes.                              Patient's presentation is most consistent with {EM COPA:27473}  Differential diagnosis includes, but is not limited to, ***        FINAL CLINICAL IMPRESSION(S) / ED DIAGNOSES   Final diagnoses:  None     Rx / DC Orders   ED Discharge Orders     None        Note:  This document was prepared using Dragon voice recognition software and may include unintentional dictation errors.

## 2022-07-27 NOTE — ED Notes (Signed)
Pt refused lunch tray. Call bell within reach.

## 2022-07-27 NOTE — ED Notes (Signed)
Report received from Kelly, RN including SBAR. Patient alert and oriented, warm and dry, and in no acute distress. Patient denies SI, HI, AVH and pain. Patient made aware of Q15 minute rounds and Rover and Officer presence for their safety. Patient instructed to come to this nurse with needs or concerns.  

## 2022-07-27 NOTE — Consult Note (Addendum)
[facility] Psych ED Progress Note  07/27/2022 3:37 PM Natasha [name]  MRN:  [id number]   Method of visit?: Face to Face   Subjective: "My outpatient provider is trying to get [place]"  Re-assessment. Patient states that she would like to discharge home. She reports that she has been thinking about it, and feels hope about getting Sprav[place] treatment, which her outpatient provider is trying to get insurance approval for.  Patient also has an appointment to speak with Dr. Weber Cooks on Thursday, August 17 at 1:20 to discuss doing ECT. She feels she will be more comfortable at home. Denies suicidal thoughts at has no plan or intent. She has contacted a friend who has agreed to stay with her for now she says. Patient is speaking in clear, coherent sentences. Does not appear to be  responding to internal stimuli. Denies AVH. Patient reports that she is comfortable returning to her local  ED if self-harm thoughts return.    PATIENT'S FRIEND UNABLE TO PICK HER UP THIS EVENING. Will reassess patient in morning.   Principal Problem: MDD (major depressive disorder), recurrent severe, without psychosis ([place]) Diagnosis:  Principal Problem:   MDD (major depressive disorder), recurrent severe, without psychosis ([place]) Active Problems:   Paraplegia ([place])   MDD (major depressive disorder), recurrent episode, severe ([place])   Muscle spasticity   Spinal cord injury, cervical region ([place])   Severe recurrent major depression without psychotic features ([place])   Suicidal ideation   Benzodiazepine overdose   Suicidal behavior with attempted self-injury ([place])   Suicidal overdose ([place])   Passive suicidal ideations  Total Time spent with patient: 20 minutes  Past Psychiatric History: see previous  Past Medical History:  Past Medical History:  Diagnosis Date   Anxiety    C6 spinal cord injury ([place])    Depression    OCD (obsessive compulsive disorder)    PTSD (post-traumatic stress disorder)     Past  Surgical History:  Procedure Laterality Date   BACK SURGERY     NECK SURGERY     Family History:  Family History  Problem Relation Age of Onset   Alcohol abuse Father    Alcohol abuse Brother    Depression Mother    Alcohol abuse Mother    Parkinson's disease Mother    Family Psychiatric  History:  Social History:  Social History   Substance and Sexual Activity  Alcohol Use Yes   Alcohol/week: 0.0 standard drinks of alcohol   Comment: Occasional use     Social History   Substance and Sexual Activity  Drug Use No    Social History   Socioeconomic History   Marital status: Single    Spouse name: Not on file   Number of children: Not on file   Years of education: Not on file   Highest education level: Not on file  Occupational History   Occupation: Disability  Tobacco Use   Smoking status: Former    Packs/day: 0.00    Types: Cigarettes   Smokeless tobacco: Never  Vaping Use   Vaping Use: Never used  Substance and Sexual Activity   Alcohol use: Yes    Alcohol/week: 0.0 standard drinks of alcohol    Comment: Occasional use   Drug use: No   Sexual activity: Not Currently  Other Topics Concern   Not on file  Social History Narrative   Pt lives alone; receives outpatient psychiatry services through [facility].  Pt is on disability due to a spinal cord injury.  Social Determinants of Health   Financial Resource Strain: Not on file  Food Insecurity: Not on file  Transportation Needs: Not on file  Physical Activity: Not on file  Stress: Not on file  Social Connections: Not on file    Sleep: Fair  Appetite:  Good  Current Medications: Current Facility-Administered Medications  Medication Dose Route Frequency Provider Last Rate Last Admin   baclofen (LIORESAL) tablet 10 mg  10 mg Oral TID Vanetta Mulders, NP   10 mg at 07/27/22 1206   busPIRone (BUSPAR) tablet 10 mg  10 mg Oral TID Vanetta Mulders, NP   10 mg at 07/27/22 1205   clonazePAM  (KLONOPIN) tablet 0.5 mg  0.5 mg Oral BID PRN Gabriel Cirri F, NP   0.5 mg at 07/27/22 1206   DULoxetine (CYMBALTA) DR capsule 120 mg  120 mg Oral Daily Gabriel Cirri F, NP   120 mg at 07/27/22 1205   famotidine (PEPCID) tablet 20 mg  20 mg Oral Daily Phineas Semen, MD   20 mg at 07/27/22 1203   QUEtiapine (SEROQUEL) tablet 100 mg  100 mg Oral QHS Gabriel Cirri F, NP       SUMAtriptan (IMITREX) tablet 100 mg  100 mg Oral Q2H PRN Phineas Semen, MD       Current Outpatient Medications  Medication Sig Dispense Refill   AUVELITY 45-105 MG TBCR Take 1 tablet by mouth 2 (two) times daily.     baclofen (LIORESAL) 10 MG tablet Take 10-20 mg by mouth 3 (three) times daily.     busPIRone (BUSPAR) 10 MG tablet Take 10 mg by mouth 3 (three) times daily.     DULoxetine (CYMBALTA) 60 MG capsule Take 120 mg by mouth daily.     famotidine (PEPCID) 20 MG tablet Take 1 tablet (20 mg total) by mouth daily. 30 tablet 1   QUEtiapine (SEROQUEL) 100 MG tablet Take 100 mg by mouth at bedtime.     clonazePAM (KLONOPIN) 0.5 MG tablet Take 0.5 mg by mouth 2 (two) times daily as needed.     SUMAtriptan (IMITREX) 100 MG tablet Take 1 tablet (100 mg total) by mouth every 2 (two) hours as needed for migraine. 10 tablet 1    Lab Results:  Results for orders placed or performed during the hospital encounter of 07/26/22 (from the past 48 hour(s))  Comprehensive metabolic panel     Status: Abnormal   Collection Time: 07/26/22  4:12 PM  Result Value Ref Range   Sodium 139 135 - 145 mmol/L   Potassium 4.3 3.5 - 5.1 mmol/L   Chloride 106 98 - 111 mmol/L   CO2 25 22 - 32 mmol/L   Glucose, Bld 113 (H) 70 - 99 mg/dL    Comment: Glucose reference range applies only to samples taken after fasting for at least 8 hours.   BUN 17 6 - 20 mg/dL   Creatinine, Ser 8.92 0.44 - 1.00 mg/dL   Calcium 9.4 8.9 - 11.9 mg/dL   Total Protein 7.4 6.5 - 8.1 g/dL   Albumin 4.0 3.5 - 5.0 g/dL   AST 24 15 - 41 U/L   ALT 25 0 - 44  U/L   Alkaline Phosphatase 72 38 - 126 U/L   Total Bilirubin 0.5 0.3 - 1.2 mg/dL   GFR, Estimated >41 >74 mL/min    Comment: (NOTE) Calculated using the CKD-EPI Creatinine Equation (2021)    Anion gap 8 5 - 15    Comment: Performed at  [facility], [address], [place], [place] [address]  Ethanol     Status: None   Collection Time: 07/26/22  4:12 PM  Result Value Ref Range   Alcohol, Ethyl (B) <10 <10 mg/dL    Comment: (NOTE) Lowest detectable limit for serum alcohol is 10 mg/dL.  For medical purposes only. Performed at [facility], [address] [place], [place] [address]   Salicylate level     Status: Abnormal   Collection Time: 07/26/22  4:12 PM  Result Value Ref Range   Salicylate Lvl <7.0 (L) 7.0 - 30.0 mg/dL    Comment: Performed at [facility], [address] [place], [place] [address]  Acetaminophen level     Status: Abnormal   Collection Time: 07/26/22  4:12 PM  Result Value Ref Range   Acetaminophen (Tylenol), Serum <10 (L) 10 - 30 ug/mL    Comment: (NOTE) Therapeutic concentrations vary significantly. A range of 10-30 ug/mL  may be an effective concentration for many patients. However, some  are best treated at concentrations outside of this range. Acetaminophen concentrations >150 ug/mL at 4 hours after ingestion  and >50 ug/mL at 12 hours after ingestion are often associated with  toxic reactions.  Performed at [facility], [address] [place], [place] [address]   cbc     Status: Abnormal   Collection Time: 07/26/22  4:12 PM  Result Value Ref Range   WBC 7.9 4.0 - 10.5 K/uL   RBC 5.14 (H) 3.87 - 5.11 MIL/uL   Hemoglobin 12.9 12.0 - 15.0 g/dL   HCT 29.5 62.1 - 30.8 %   MCV 83.1 80.0 - 100.0 fL   MCH 25.1 (L) 26.0 - 34.0 pg   MCHC 30.2 30.0 - 36.0 g/dL   RDW 65.7 (H) 84.6 - 96.2 %   Platelets 312 150 - 400 K/uL   nRBC 0.0 0.0 - 0.2 %    Comment: Performed at [facility], [address], [place], [place] [address]  Urine Drug Screen, Qualitative     Status: Abnormal   Collection Time: 07/26/22  4:54 PM  Result Value Ref Range   Tricyclic, Ur Screen POSITIVE (A) NONE DETECTED   Amphetamines, Ur Screen NONE DETECTED NONE DETECTED   MDMA (Ecstasy)Ur Screen NONE DETECTED NONE DETECTED   Cocaine Metabolite,Ur [place] NONE DETECTED NONE DETECTED   Opiate, Ur Screen NONE DETECTED NONE DETECTED   Phencyclidine (PCP) Ur S NONE DETECTED NONE DETECTED   Cannabinoid 50 Ng, Ur [place] NONE DETECTED NONE DETECTED   Barbiturates, Ur Screen NONE DETECTED NONE DETECTED   Benzodiazepine, Ur Scrn POSITIVE (A) NONE DETECTED   Methadone Scn, Ur NONE DETECTED NONE DETECTED    Comment: (NOTE) Tricyclics + metabolites, urine    Cutoff 1000 ng/mL Amphetamines + metabolites, urine  Cutoff 1000 ng/mL MDMA (Ecstasy), urine              Cutoff 500 ng/mL Cocaine Metabolite, urine          Cutoff 300 ng/mL Opiate + metabolites, urine        Cutoff 300 ng/mL Phencyclidine (PCP), urine         Cutoff 25 ng/mL Cannabinoid, urine                 Cutoff 50 ng/mL Barbiturates + metabolites, urine  Cutoff 200 ng/mL Benzodiazepine, urine              Cutoff 200 ng/mL Methadone, urine  Cutoff 300 ng/mL  The urine drug screen provides only a preliminary, unconfirmed analytical test result and should not be used for non-medical purposes. Clinical consideration and professional judgment should be applied to any positive drug screen result due to possible interfering substances. A more specific alternate chemical method must be used in order to obtain a confirmed analytical result. Gas chromatography / mass spectrometry (GC/MS) is the preferred confirm atory method. Performed at [facility], [address] [place], [place] [address]   POC urine preg, ED     Status: None   Collection Time: 07/26/22  4:57 PM  Result Value Ref Range   Preg Test, Ur NEGATIVE NEGATIVE    Comment:         THE SENSITIVITY OF THIS METHODOLOGY IS >24 mIU/mL   Resp Panel by RT-PCR (Flu A&B, Covid) Anterior Nasal Swab     Status: None   Collection Time: 07/26/22  6:30 PM   Specimen: Anterior Nasal Swab  Result Value Ref Range   SARS Coronavirus 2 by RT PCR NEGATIVE NEGATIVE    Comment: (NOTE) SARS-CoV-2 target nucleic acids are NOT DETECTED.  The SARS-CoV-2 RNA is generally detectable in upper respiratory specimens during the acute phase of infection. The lowest concentration of SARS-CoV-2 viral copies this assay can detect is 138 copies/mL. A negative result does not preclude SARS-Cov-2 infection and should not be used as the sole basis for treatment or other patient management decisions. A negative result may occur with  improper specimen collection/handling, submission of specimen other than nasopharyngeal swab, presence of viral mutation(s) within the areas targeted by this assay, and inadequate number of viral copies(<138 copies/mL). A negative result must be combined with clinical observations, patient history, and epidemiological information. The expected result is Negative.  Fact Sheet for Patients:  BloggerCourse.com  Fact Sheet for Healthcare Providers:  SeriousBroker.it  This test is no t yet approved or cleared by the [place] FDA and  has been authorized for detection and/or diagnosis of SARS-CoV-2 by FDA under an Emergency Use Authorization (EUA). This EUA will remain  in effect (meaning this test can be used) for the duration of the COVID-19 declaration under Section 564(b)(1) of the Act, 21 U.S.C.section 360bbb-3(b)(1), unless the authorization is terminated  or revoked sooner.       Influenza A by PCR NEGATIVE NEGATIVE   Influenza B by PCR NEGATIVE NEGATIVE    Comment: (NOTE) The Xpert Xpress SARS-CoV-2/FLU/RSV plus assay is intended as an aid in the diagnosis of influenza from Nasopharyngeal swab specimens  and should not be used as a sole basis for treatment. Nasal washings and aspirates are unacceptable for Xpert Xpress SARS-CoV-2/FLU/RSV testing.  Fact Sheet for Patients: BloggerCourse.com  Fact Sheet for Healthcare Providers: SeriousBroker.it  This test is not yet approved or cleared by the [place] FDA and has been authorized for detection and/or diagnosis of SARS-CoV-2 by FDA under an Emergency Use Authorization (EUA). This EUA will remain in effect (meaning this test can be used) for the duration of the COVID-19 declaration under Section 564(b)(1) of the Act, 21 U.S.C. section 360bbb-3(b)(1), unless the authorization is terminated or revoked.  Performed at [facility], [address], [place], [place] [address]     Blood Alcohol level:  Lab Results  Component Value Date   [facility] <10 07/26/2022   ETH <10 06/10/2022    Physical Findings: AIMS:  , ,  ,  ,    CIWA:    COWS:  Musculoskeletal: Strength & Muscle Tone: decreased Gait & Station:  paraplegia Patient leans: N/A  Psychiatric Specialty Exam:  Presentation  General Appearance: Appropriate for Environment  Eye Contact:Good  Speech:Clear and Coherent  Speech Volume:Normal  Handedness:Right   Mood and Affect  Mood:Depressed  Affect:Congruent   Thought Process  Thought Processes:Coherent  Descriptions of Associations:Intact  Orientation:Full (Time, Place and Person)  Thought Content:WDL  History of Schizophrenia/Schizoaffective disorder:No  Duration of Psychotic Symptoms:No data recorded Hallucinations:Hallucinations: None  Ideas of Reference:None  Suicidal Thoughts: No Homicidal Thoughts:Homicidal Thoughts: No   Sensorium  Memory:Immediate Good  Judgment:Good  Insight:Good   Executive Functions  Concentration:Good  Attention Span:Good  [place] of Knowledge:Good  Language:Good   Psychomotor  Activity  Psychomotor Activity:Psychomotor Activity: Normal   Assets  Assets:Communication Skills; Desire for Improvement; Financial Resources/Insurance; Housing; Resilience; Social Support; Physical Health   Sleep  Sleep:Sleep: Fair Number of Hours of Sleep: 8    Physical Exam: Physical Exam Vitals and nursing note reviewed.  HENT:     Head: Normocephalic.     Nose: No congestion or rhinorrhea.  Eyes:     General:        Right eye: No discharge.        Left eye: No discharge.  Cardiovascular:     Rate and Rhythm: Normal rate.  Pulmonary:     Effort: Pulmonary effort is normal.  Musculoskeletal:     Cervical back: Normal range of motion.  Skin:    General: Skin is dry.  Neurological:     Mental Status: She is alert and oriented to person, place, and time.  Psychiatric:        Attention and Perception: Attention normal.        Behavior: Behavior normal.    Review of Systems  Constitutional: Negative.   HENT: Negative.    Eyes: Negative.   Respiratory: Negative.    Cardiovascular: Negative.   Psychiatric/Behavioral:  Positive for depression (chronic). Negative for hallucinations, memory loss, substance abuse and suicidal ideas. The patient is not nervous/anxious and does not have insomnia.    Blood pressure 133/84, pulse 69, temperature 98.3 F (36.8 C), resp. rate 18, weight 77.1 kg, SpO2 100 %. Body mass index is 33.2 kg/m.  Treatment Plan Summary: Patient states that she does not feel suicidal and is requesting to be discharged home. She reports that she will have a friend stay with her and contact her outpatient provider. Patient states that she feels safe for discharge and will be moore comfortable at home. She expresses hope for her future. Discussed with EDP and Dr. Weber Cooks. CAVEAT: Patient's friend can't come to get her tonight. Patient will remain in ED overnight and may be admitted to the inpatient unit for ECT if appropriate and there is a bed.  Sherlon Handing, NP 07/27/2022, 3:37 PM

## 2022-07-27 NOTE — ED Notes (Signed)
PT to be admitted to Impatient admit tonight for ECT

## 2022-07-28 ENCOUNTER — Inpatient Hospital Stay
Admission: AD | Admit: 2022-07-28 | Discharge: 2022-08-05 | DRG: 885 | Disposition: A | Discharge status: Home / Self Care | Payer: 59 | Source: Intra-hospital | Attending: Psychiatry | Admitting: Psychiatry

## 2022-07-28 ENCOUNTER — Encounter: Payer: Self-pay | Admitting: Psychiatry

## 2022-07-28 ENCOUNTER — Other Ambulatory Visit: Payer: Self-pay

## 2022-07-28 DIAGNOSIS — Z79899 Other long term (current) drug therapy: Secondary | ICD-10-CM

## 2022-07-28 DIAGNOSIS — Z87891 Personal history of nicotine dependence: Secondary | ICD-10-CM | POA: Diagnosis not present

## 2022-07-28 DIAGNOSIS — G8250 Quadriplegia, unspecified: Secondary | ICD-10-CM | POA: Diagnosis present

## 2022-07-28 DIAGNOSIS — Z20822 Contact with and (suspected) exposure to covid-19: Secondary | ICD-10-CM | POA: Diagnosis present

## 2022-07-28 DIAGNOSIS — Z820 Family history of epilepsy and other diseases of the nervous system: Secondary | ICD-10-CM

## 2022-07-28 DIAGNOSIS — Z86718 Personal history of other venous thrombosis and embolism: Secondary | ICD-10-CM | POA: Diagnosis not present

## 2022-07-28 DIAGNOSIS — F332 Major depressive disorder, recurrent severe without psychotic features: Secondary | ICD-10-CM | POA: Diagnosis present

## 2022-07-28 DIAGNOSIS — Z811 Family history of alcohol abuse and dependence: Secondary | ICD-10-CM | POA: Diagnosis not present

## 2022-07-28 DIAGNOSIS — F429 Obsessive-compulsive disorder, unspecified: Secondary | ICD-10-CM | POA: Diagnosis present

## 2022-07-28 DIAGNOSIS — Z9151 Personal history of suicidal behavior: Secondary | ICD-10-CM

## 2022-07-28 DIAGNOSIS — K219 Gastro-esophageal reflux disease without esophagitis: Secondary | ICD-10-CM | POA: Diagnosis present

## 2022-07-28 DIAGNOSIS — F4310 Post-traumatic stress disorder, unspecified: Secondary | ICD-10-CM | POA: Diagnosis present

## 2022-07-28 DIAGNOSIS — Z818 Family history of other mental and behavioral disorders: Secondary | ICD-10-CM

## 2022-07-28 DIAGNOSIS — R45851 Suicidal ideations: Secondary | ICD-10-CM | POA: Diagnosis present

## 2022-07-28 MED ORDER — HYDROXYZINE HCL 25 MG PO TABS: 25.0000 mg | ORAL_TABLET | Freq: Three times a day (TID) | ORAL | Status: DC | PRN

## 2022-07-28 MED ORDER — MAGNESIUM HYDROXIDE 400 MG/5ML PO SUSP: 30.0000 mL | Freq: Every day | ORAL | Status: DC | PRN

## 2022-07-28 MED ORDER — BUSPIRONE HCL 5 MG PO TABS
10.0000 mg | ORAL_TABLET | Freq: Three times a day (TID) | ORAL | Status: DC
  Administered 2022-07-28 – 2022-08-05 (×22): 10 mg via ORAL
  Filled 2022-07-28 (×23): qty 2

## 2022-07-28 MED ORDER — CLONAZEPAM 0.5 MG PO TABS
0.5000 mg | ORAL_TABLET | Freq: Two times a day (BID) | ORAL | Status: DC | PRN
  Administered 2022-07-28 – 2022-08-04 (×9): 0.5 mg via ORAL
  Filled 2022-07-28 (×9): qty 1

## 2022-07-28 MED ORDER — ACETAMINOPHEN 325 MG PO TABS
650.0000 mg | ORAL_TABLET | Freq: Once | ORAL | Status: AC
Start: 2022-07-28 — End: 2022-07-28
  Administered 2022-07-28: 650 mg via ORAL
  Filled 2022-07-28: qty 2

## 2022-07-28 MED ORDER — QUETIAPINE FUMARATE 100 MG PO TABS
100.0000 mg | ORAL_TABLET | Freq: Every day | ORAL | Status: DC
  Administered 2022-07-28 – 2022-08-04 (×8): 100 mg via ORAL
  Filled 2022-07-28 (×8): qty 1

## 2022-07-28 MED ORDER — SUMATRIPTAN SUCCINATE 50 MG PO TABS
100.0000 mg | ORAL_TABLET | ORAL | Status: DC | PRN
  Administered 2022-07-29 – 2022-08-04 (×3): 100 mg via ORAL
  Filled 2022-07-28 (×3): qty 2

## 2022-07-28 MED ORDER — BACLOFEN 10 MG PO TABS
10.0000 mg | ORAL_TABLET | Freq: Three times a day (TID) | ORAL | Status: DC
  Administered 2022-07-28 – 2022-08-05 (×22): 10 mg via ORAL
  Filled 2022-07-28 (×25): qty 1

## 2022-07-28 MED ORDER — ACETAMINOPHEN 325 MG PO TABS
650.0000 mg | ORAL_TABLET | Freq: Four times a day (QID) | ORAL | Status: DC | PRN
  Administered 2022-07-28 – 2022-07-29 (×2): 650 mg via ORAL
  Filled 2022-07-28 (×2): qty 2

## 2022-07-28 MED ORDER — ALUM & MAG HYDROXIDE-SIMETH 200-200-20 MG/5ML PO SUSP: 30.0000 mL | ORAL | Status: DC | PRN

## 2022-07-28 MED ORDER — DULOXETINE HCL 30 MG PO CPEP
120.0000 mg | ORAL_CAPSULE | Freq: Every day | ORAL | Status: DC
  Administered 2022-07-29 – 2022-08-05 (×8): 120 mg via ORAL
  Filled 2022-07-28 (×8): qty 4

## 2022-07-28 MED ORDER — FAMOTIDINE 20 MG PO TABS
20.0000 mg | ORAL_TABLET | Freq: Every day | ORAL | Status: DC
  Administered 2022-07-29 – 2022-08-05 (×8): 20 mg via ORAL
  Filled 2022-07-28 (×8): qty 1

## 2022-07-28 NOTE — ED Provider Notes (Signed)
Emergency Medicine Observation Re-evaluation Note  Natasha [name] is a 51 y.o. female, seen on rounds today.  Pt initially presented to the ED for complaints of Psychiatric Evaluation  Currently, the patient is is no acute distress. Denies any concerns at this time.  Physical Exam  Blood pressure (!) 135/101, pulse 83, temperature 98.5 F (36.9 C), resp. rate 17, weight 77.1 kg, SpO2 98 %.  Physical Exam: General: No apparent distress Pulm: Normal WOB Neuro: Moving all extremities Psych: Resting comfortably     ED Course / MDM     I have reviewed the labs performed to date as well as medications administered while in observation.  Recent changes in the last 24 hours include: No acute events overnight.  Plan   Current plan: Patient awaiting psychiatric disposition. Patient is not under full IVC at this time.    Natasha [name], [name], DO 07/28/22 [phone]

## 2022-07-28 NOTE — Tx Team (Signed)
Initial Treatment Plan 07/28/2022 4:37 PM Natasha [name] [id number]    PATIENT STRESSORS: Educational concerns     PATIENT STRENGTHS: Motivation for treatment/growth    PATIENT IDENTIFIED PROBLEMS: Depression, anxiety, stress                     DISCHARGE CRITERIA:  Improved stabilization in mood, thinking, and/or behavior  PRELIMINARY DISCHARGE PLAN: Return to previous living arrangement  PATIENT/FAMILY INVOLVEMENT: This treatment plan has been presented to and reviewed with the patient, Natasha [name] .  The patient and family have been given the opportunity to ask questions and make suggestions.  Hyman Hopes, RN 07/28/2022, 4:37 PM

## 2022-07-28 NOTE — Group Note (Signed)
[facility] LCSW Group Therapy Note   Group Date: 07/28/2022 Start Time: 1330 End Time: 1430   Type of Therapy/Topic:  Group Therapy:  Emotion Regulation  Participation Level:  Did Not Attend    Description of Group:    The purpose of this group is to assist patients in learning to regulate negative emotions and experience positive emotions. Patients will be guided to discuss ways in which they have been vulnerable to their negative emotions. These vulnerabilities will be juxtaposed with experiences of positive emotions or situations, and patients challenged to use positive emotions to combat negative ones. Special emphasis will be placed on coping with negative emotions in conflict situations, and patients will process healthy conflict resolution skills.  Therapeutic Goals: Patient will identify two positive emotions or experiences to reflect on in order to balance out negative emotions:  Patient will label two or more emotions that they find the most difficult to experience:  Patient will be able to demonstrate positive conflict resolution skills through discussion or role plays:   Summary of Patient Progress: X   Therapeutic Modalities:   Cognitive Behavioral Therapy Feelings Identification Dialectical Behavioral Therapy   Natasha [name] R Malcomb Gangemi, LCSW 

## 2022-07-28 NOTE — Progress Notes (Signed)
Patient ID: Natasha [name], female   DOB: [birth year], 51 y.o.   MRN: [id number] Admission note: Patient is a 51 year old female, presents voluntary per report of suicidal ideation. Patient is alert and oriented to unit. Patients affect is sad. Patient is cooperative during assessment questions. Patient states the reason for admission is because her depression has gotten worse. She has a history of prior suicide attempts, her last attempt was in June of this year by overdosing on prescribed medication.  Patient states that she has not had an appetite in about a month or two due to her depression stating "this is the worst it has ever been." Patient rates pain 4/10 for chronic pain in knee, back, hands, and feet.   Patient presents calm and cooperative.  Patient currently denies SI/HI/AVH.  Patient states that during her time on the unit she would like to work on "getting better, and lifting my depression."  Unit policies explained and verbalized understanding. Q15 minute checks maintained and will continue to monitor.

## 2022-07-28 NOTE — ED Notes (Signed)
Pt's friend Tish Men just called this Clinical research associate informing me that she does not feel comfortable with the pt returning home at this time. Friend states she does not want to be responsible if something happens to the pt and would like for her to be admitted for psychiatric help. Will inform psych provider when they are available.

## 2022-07-28 NOTE — BH Assessment (Signed)
Patient is to be admitted to [facility] today 07/28/22 by Dr. Toni Amend.  Attending Physician will be Dr.  Toni Amend .   Patient has been assigned to room 301, by [facility] Charge Nurse Goigi.    ER staff is aware of the admission: Fleet Contras, ER Secretary   Dr. Roxan Hockey, ER MD  Payton Spark, Patient's Nurse  Sue Lush, Patient Access.

## 2022-07-29 ENCOUNTER — Other Ambulatory Visit: Payer: Self-pay | Admitting: Psychiatry

## 2022-07-29 ENCOUNTER — Telehealth: Payer: Medicaid Other | Admitting: Psychiatry

## 2022-07-29 DIAGNOSIS — F332 Major depressive disorder, recurrent severe without psychotic features: Secondary | ICD-10-CM | POA: Diagnosis not present

## 2022-07-29 DIAGNOSIS — G8250 Quadriplegia, unspecified: Secondary | ICD-10-CM

## 2022-07-29 NOTE — Progress Notes (Signed)
Patient is pleasant and cooperative. She has been in the dayroom reading a book since the start of this shift. She interacts well with the other patients on the unit. She reports having chronic worsening depression. She denies si/hi/avh and anxiety at this encounter. She endorses pain, chronic from the back, knees, hands and feet.  She also reports  having a headache rating it 5/10.  Received medication without incident.  Will continue to monitor with q15 minute safety checks.  Encouraged patient to seek staff with any concerns that she may have.   C Butler-Nicholson, LPN

## 2022-07-29 NOTE — H&P (Signed)
Psychiatric Admission Assessment Adult  Patient Identification: Natasha [name] MRN:  [id number] Date of Evaluation:  07/29/2022 Chief Complaint:  Suicidal ideation [R45.851] Principal Diagnosis: MDD (major depressive disorder), recurrent severe, without psychosis (HCC) Diagnosis:  Principal Problem:   MDD (major depressive disorder), recurrent severe, without psychosis (HCC) Active Problems:   Suicidal ideation   Quadriplegia (HCC)  History of Present Illness: Patient seen.  Patient previous encounters.  51 year old woman with a history of recurrent severe depression and recurrent suicidal behavior comes to the emergency room with suicidal ideation and request to return to treatment with ECT.  Since we last saw her patient has had more stress with difficulty getting complete outpatient treatment.  Not currently seeing a therapist.  Limited contact with prescribing provider.  Has not been able to get back into school as she had previously wished.  Patient got very depressed earlier this year had a suicide attempt in June and has returned to some cutting.  She is requesting ECT.  Continues to have suicidal ideation but is lucid and wants to get treatment and has no intention of harming herself in the hospital.  Not having psychotic symptoms. Associated Signs/Symptoms: Depression Symptoms:  depressed mood, anhedonia, psychomotor retardation, hopelessness, suicidal attempt, Duration of Depression Symptoms: Greater than two weeks  (Hypo) Manic Symptoms:  Impulsivity, Anxiety Symptoms:  Excessive Worry, Psychotic Symptoms:   None reported PTSD Symptoms: Negative Total Time spent with patient: 1 hour  Past Psychiatric History: Patient has a long history of recurrent depression.  Multiple suicide attempts.  Patient is partially paralyzed due to a suicide attempt several years ago.  ECT has been very effective for her in the past and she has had several ECT courses with good  response.  Is the patient at risk to self? Yes.    Has the patient been a risk to self in the past 6 months? Yes.    Has the patient been a risk to self within the distant past? Yes.    Is the patient a risk to others? No.  Has the patient been a risk to others in the past 6 months? No.  Has the patient been a risk to others within the distant past? No.   [place] Scale:  Flowsheet Row Admission (Current) from 07[facility]28/2022 [facility] from 07/26/2022 in [facility] to Hosp-Admission (Discharged) from 06/10/2022 in [facility] EAST MEDICAL UNIT  C-SSRS RISK CATEGORY High Risk High Risk High Risk        Prior Inpatient Therapy:   Prior Outpatient Therapy:    Alcohol Screening: Patient refused Alcohol Screening Tool: Yes 1. How often do you have a drink containing alcohol?: Never 2. How many drinks containing alcohol do you have on a typical day when you are drinking?: 1 or 2 3. How often do you have six or more drinks on one occasion?: Never AUDIT-C Score: 0 4. How often during the last year have you found that you were not able to stop drinking once you had started?: Never 5. How often during the last year have you failed to do what was normally expected from you because of drinking?: Never 6. How often during the last year have you needed a first drink in the morning to get yourself going after a heavy drinking session?: Never 7. How often during the last year have you had a feeling of guilt of remorse after drinking?: Never 8. How often during the  last year have you been unable to remember what happened the night before because you had been drinking?: Never 9. Have you or someone else been injured as a result of your drinking?: No 10. Has a relative or friend or a doctor or another health worker been concerned about your drinking or suggested you cut down?: No Alcohol Use Disorder Identification  Test Final Score (AUDIT): 0 Substance Abuse History in the last 12 months:  No. Consequences of Substance Abuse: Negative Previous Psychotropic Medications: Yes  Psychological Evaluations: Yes  Past Medical History:  Past Medical History:  Diagnosis Date   Anxiety    C6 spinal cord injury (HCC)    Depression    OCD (obsessive compulsive disorder)    PTSD (post-traumatic stress disorder)     Past Surgical History:  Procedure Laterality Date   BACK SURGERY     NECK SURGERY     Family History:  Family History  Problem Relation Age of Onset   Alcohol abuse Father    Alcohol abuse Brother    Depression Mother    Alcohol abuse Mother    Parkinson's disease Mother    Family Psychiatric  History: Alcohol abuse in the family Tobacco Screening:   Social History:  Social History   Substance and Sexual Activity  Alcohol Use Yes   Alcohol/week: 0.0 standard drinks of alcohol   Comment: Occasional use     Social History   Substance and Sexual Activity  Drug Use No    Additional Social History:                           Allergies:  No Known Allergies Lab Results: No results found for this or any previous visit (from the past 48 hour(s)).  Blood Alcohol level:  Lab Results  Component Value Date   ETH <10 07/26/2022   ETH <10 06/10/2022    Metabolic Disorder Labs:  Lab Results  Component Value Date   HGBA1C 5.1 01/27/2022   MPG 99.67 01/27/2022   MPG 85.32 10/16/2020   Lab Results  Component Value Date   PROLACTIN 121.9 (H) 02/03/2017   Lab Results  Component Value Date   CHOL 236 (H) 10/16/2020   TRIG 94 10/16/2020   HDL 78 10/16/2020   CHOLHDL 3.0 10/16/2020   VLDL 19 10/16/2020   LDLCALC 139 (H) 10/16/2020   LDLCALC 160 (H) 05/26/2020    Current Medications: Current Facility-Administered Medications  Medication Dose Route Frequency Provider Last Rate Last Admin   acetaminophen (TYLENOL) tablet 650 mg  650 mg Oral Q6H PRN Gabriel Cirri  F, NP   650 mg at 07/28/22 2120   alum & mag hydroxide-simeth (MAALOX/MYLANTA) 200-200-20 MG/5ML suspension 30 mL  30 mL Oral Q4H PRN Gabriel Cirri F, NP       baclofen (LIORESAL) tablet 10 mg  10 mg Oral TID Gabriel Cirri F, NP   10 mg at 07/29/22 1201   busPIRone (BUSPAR) tablet 10 mg  10 mg Oral TID Vanetta Mulders, NP   10 mg at 07/29/22 1201   clonazePAM (KLONOPIN) tablet 0.5 mg  0.5 mg Oral BID PRN Gabriel Cirri F, NP   0.5 mg at 07/28/22 2120   DULoxetine (CYMBALTA) DR capsule 120 mg  120 mg Oral Daily Gabriel Cirri F, NP   120 mg at 07/29/22 0740   famotidine (PEPCID) tablet 20 mg  20 mg Oral Daily Vanetta Mulders, NP   20 mg  at 07/29/22 0740   hydrOXYzine (ATARAX) tablet 25 mg  25 mg Oral TID PRN Vanetta Mulders, NP       magnesium hydroxide (MILK OF MAGNESIA) suspension 30 mL  30 mL Oral Daily PRN Gabriel Cirri F, NP       QUEtiapine (SEROQUEL) tablet 100 mg  100 mg Oral QHS Gabriel Cirri F, NP   100 mg at 07/28/22 2120   SUMAtriptan (IMITREX) tablet 100 mg  100 mg Oral Q2H PRN Vanetta Mulders, NP   100 mg at 07/29/22 1024   PTA Medications: Medications Prior to Admission  Medication Sig Dispense Refill Last Dose   AUVELITY 45-105 MG TBCR Take 1 tablet by mouth 2 (two) times daily.      baclofen (LIORESAL) 10 MG tablet Take 10-20 mg by mouth 3 (three) times daily.      busPIRone (BUSPAR) 10 MG tablet Take 10 mg by mouth 3 (three) times daily.      clonazePAM (KLONOPIN) 0.5 MG tablet Take 0.5 mg by mouth 2 (two) times daily as needed.      DULoxetine (CYMBALTA) 60 MG capsule Take 120 mg by mouth daily.      famotidine (PEPCID) 20 MG tablet Take 1 tablet (20 mg total) by mouth daily. 30 tablet 1    QUEtiapine (SEROQUEL) 100 MG tablet Take 100 mg by mouth at bedtime.      SUMAtriptan (IMITREX) 100 MG tablet Take 1 tablet (100 mg total) by mouth every 2 (two) hours as needed for migraine. 10 tablet 1     Musculoskeletal: Strength & Muscle Tone: within  normal limits Gait & Station: normal Patient leans: N/A            Psychiatric Specialty Exam:  Presentation  General Appearance: Appropriate for Environment  Eye Contact:Good  Speech:Clear and Coherent  Speech Volume:Normal  Handedness:Right   Mood and Affect  Mood:Depressed  Affect:Congruent   Thought Process  Thought Processes:Coherent  Duration of Psychotic Symptoms: No data recorded Past Diagnosis of Schizophrenia or Psychoactive disorder: No  Descriptions of Associations:Intact  Orientation:Full (Time, Place and Person)  Thought Content:WDL  Hallucinations:No data recorded Ideas of Reference:None  Suicidal Thoughts:No data recorded Homicidal Thoughts:No data recorded  Sensorium  Memory:Immediate Good  Judgment:Good  Insight:Good   Executive Functions  Concentration:Good  Attention Span:Good  Recall:Good  Fund of Knowledge:Good  Language:Good   Psychomotor Activity  Psychomotor Activity:No data recorded  Assets  Assets:Communication Skills; Desire for Improvement; Financial Resources/Insurance; Housing; Resilience; Social Support; Physical Health   Sleep  Sleep:No data recorded   Physical Exam: Physical Exam Constitutional:      Appearance: Normal appearance.  HENT:     Head: Normocephalic and atraumatic.     Mouth/Throat:     Pharynx: Oropharynx is clear.  Eyes:     Pupils: Pupils are equal, round, and reactive to light.  Cardiovascular:     Rate and Rhythm: Normal rate and regular rhythm.  Pulmonary:     Effort: Pulmonary effort is normal.     Breath sounds: Normal breath sounds.  Abdominal:     General: Abdomen is flat.     Palpations: Abdomen is soft.  Musculoskeletal:        General: Normal range of motion.  Skin:    General: Skin is warm and dry.  Neurological:     General: No focal deficit present.     Mental Status: She is alert. Mental status is at baseline.     Comments: Patient  has quadriplegia  which is stable.  Not much use in her lower extremities.  Still has strength and some meaningful use of her upper extremities although with limited coordination.  Psychiatric:        Attention and Perception: Attention normal.        Mood and Affect: Mood is depressed.        Speech: Speech normal.        Behavior: Behavior is cooperative.        Thought Content: Thought content includes suicidal ideation. Thought content does not include suicidal plan.        Cognition and Memory: Cognition normal.    Review of Systems  Constitutional: Negative.   HENT: Negative.    Eyes: Negative.   Respiratory: Negative.    Cardiovascular: Negative.   Gastrointestinal: Negative.   Musculoskeletal: Negative.   Skin: Negative.   Neurological: Negative.   Psychiatric/Behavioral:  Positive for depression and suicidal ideas. The patient is nervous/anxious.    Blood pressure (!) 123/94, pulse 93, temperature 98 F (36.7 C), temperature source Oral, resp. rate 18, height 5' (1.524 m), weight 76.7 kg, SpO2 92 %. Body mass index is 33.01 kg/m.  Treatment Plan Summary: Medication management and Plan no change to current medication management.  Labs reviewed.  Plan is likely for ECT starting tomorrow based on previous parameters.  Patient agrees to plan.  Continue involvement daily in groups on the unit.  Observation Level/Precautions:  15 minute checks  Laboratory:  Chemistry Profile  Psychotherapy:    Medications:    Consultations:    Discharge Concerns:    Estimated LOS:  Other:     Physician Treatment Plan for Primary Diagnosis: MDD (major depressive disorder), recurrent severe, without psychosis (HCC) Long Term Goal(s): Improvement in symptoms so as ready for discharge  Short Term Goals: Ability to verbalize feelings will improve, Ability to disclose and discuss suicidal ideas, and Ability to demonstrate self-control will improve  Physician Treatment Plan for Secondary Diagnosis: Principal  Problem:   MDD (major depressive disorder), recurrent severe, without psychosis (HCC) Active Problems:   Suicidal ideation   Quadriplegia (HCC)  Long Term Goal(s): Improvement in symptoms so as ready for discharge  Short Term Goals: Ability to maintain clinical measurements within normal limits will improve  I certify that inpatient services furnished can reasonably be expected to improve the patient's condition.    Mordecai Rasmussen, MD 8/17/20232:36 PM

## 2022-07-29 NOTE — Progress Notes (Signed)
Recreation Therapy Notes      Date: 07/29/2022   Time: 10:45 am     Location: Craft room    Behavioral response: N/A   Intervention Topic: Self-care   Discussion/Intervention: Patient refused to attend group.    Clinical Observations/Feedback:  Patient refused to attend group.    Betty Daidone LRT/CTRS          Makhia Vosler 07/29/2022 11:51 AM        Sun Wilensky 07/29/2022 11:51 AM

## 2022-07-29 NOTE — [facility] Suicide Risk Assessment (Signed)
[facility] INPATIENT:  Family/Significant Other Suicide Prevention Education  Suicide Prevention Education:  Patient Refusal for Family/Significant Other Suicide Prevention Education: The patient Natasha [name] has refused to provide written consent for family/significant other to be provided Family/Significant Other Suicide Prevention Education during admission and/or prior to discharge.  Physician notified.  SPE completed with pt, as pt refused to consent to family contact. SPI pamphlet provided to pt and pt was encouraged to share information with support network, ask questions, and talk about any concerns relating to SPE. Pt denies access to guns/firearms and verbalized understanding of information provided. Mobile Crisis information also provided to pt.  [name] 07/29/2022, 10:25 AM
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
## 2022-07-29 NOTE — Group Note (Signed)
[facility] LCSW Group Therapy Note   Group Date: 07/29/2022 Start Time: 1300 End Time: 1400   Type of Therapy/Topic:  Group Therapy:  Balance in Life  Participation Level:  Did Not Attend   Description of Group:    This group will address the concept of balance and how it feels and looks when one is unbalanced. Patients will be encouraged to process areas in their lives that are out of balance, and identify reasons for remaining unbalanced. Facilitators will guide patients utilizing problem- solving interventions to address and correct the stressor making their life unbalanced. Understanding and applying boundaries will be explored and addressed for obtaining  and maintaining a balanced life. Patients will be encouraged to explore ways to assertively make their unbalanced needs known to significant others in their lives, using other group members and facilitator for support and feedback.  Therapeutic Goals: Patient will identify two or more emotions or situations they have that consume much of in their lives. Patient will identify signs/triggers that life has become out of balance:  Patient will identify two ways to set boundaries in order to achieve balance in their lives:  Patient will demonstrate ability to communicate their needs through discussion and/or role plays  Summary of Patient Progress: X   Therapeutic Modalities:   Cognitive Behavioral Therapy Solution-Focused Therapy Assertiveness Training   Kaelie Henigan R Lezlie Ritchey, LCSW 

## 2022-07-29 NOTE — Plan of Care (Signed)
Patient during assessments this morning endorsed a depressed mood, with a sad and restricted affect, fair eye contact, and a congruent interpersonal style. Patient denied active thoughts of harming herself on the unit, but has passive thoughts of suicide and death and dying. Patient denied HI/AVH. Pt. Orientation appeared grossly intact. Pt. Endorsed chronic physical pain being managed and endorsed toleration of medications thus far. Pt. Endorsed energy has been low and appetite has been poor. Patient and this Clinical research associate reviewed high falls risk safety.    Patient has been complaint with medications and unit procedures thus far. Pt. Has been observed skipping breakfast thus far. Pt. Has been able to remain safe on the unit thus far. Patient thus far this shift has been isolative and withdrawn to her room.   Q x 15 minute observation checks in place/maintained for safety. Patient is provided with education throughout shift when appropriate and able.  Patient is given/offered medications per orders. Patient is encouraged to attend groups, participate in unit activities and continue with plan of care. Pt. Chart and plans of care reviewed. Pt. Given support and encouragement when appropriate and able.        Problem: Safety: Goal: Ability to remain free from injury will improve Outcome: Progressing Note: Patient has verbalized high falls risk safety intervention understanding.    Problem: Health Behavior/Discharge Planning: Goal: Compliance with treatment plan for underlying cause of condition will improve Outcome: Progressing Note: Patient has been complaint with medications.    Problem: Safety: Goal: Periods of time without injury will increase Outcome: Progressing Note: Patient has been able to remain safe on the unit.

## 2022-07-29 NOTE — Plan of Care (Signed)
  Problem: Education: Goal: Knowledge of General Education information will improve Description: Including pain rating scale, medication(s)/side effects and non-pharmacologic comfort measures Outcome: Progressing   Problem: Education: Goal: Knowledge of [place] [facility] Education information/materials will improve Outcome: Progressing Goal: Verbalization of understanding the information provided will improve Outcome: Progressing   Problem: Activity: Goal: Interest or engagement in activities will improve Outcome: Progressing   Problem: Health Behavior/Discharge Planning: Goal: Compliance with treatment plan for underlying cause of condition will improve Outcome: Progressing

## 2022-07-29 NOTE — BHH Suicide Risk Assessment (Signed)
[facility] Admission Suicide Risk Assessment   Nursing information obtained from:  Patient, Review of record Demographic factors:  Caucasian, Living alone, Unemployed Current Mental Status:  NA Loss Factors:  Decline in physical health Historical Factors:  Prior suicide attempts Risk Reduction Factors:  NA  Total Time spent with patient: 1 hour Principal Problem: MDD (major depressive disorder), recurrent severe, without psychosis (HCC) Diagnosis:  Principal Problem:   MDD (major depressive disorder), recurrent severe, without psychosis (HCC) Active Problems:   Suicidal ideation   Quadriplegia (HCC)  Subjective Data: Patient seen and chart reviewed.  51 year old woman with recurrent severe depression comes to the hospital with return of severely depressed mood and suicidal ideation with declining functionality limited outpatient treatment.  Patient is requesting restarting of electroconvulsive therapy.  Continued Clinical Symptoms:  Alcohol Use Disorder Identification Test Final Score (AUDIT): 0 The "Alcohol Use Disorders Identification Test", Guidelines for Use in Primary Care, Second Edition.  World Science writer [facility]). Score between 0-7:  no or low risk or alcohol related problems. Score between 8-15:  moderate risk of alcohol related problems. Score between 16-19:  high risk of alcohol related problems. Score 20 or above:  warrants further diagnostic evaluation for alcohol dependence and treatment.   CLINICAL FACTORS:   Depression:   Anhedonia Impulsivity Insomnia   Musculoskeletal: Strength & Muscle Tone: decreased Gait & Station: unable to stand Patient leans: N/A  Psychiatric Specialty Exam:  Presentation  General Appearance: Appropriate for Environment  Eye Contact:Good  Speech:Clear and Coherent  Speech Volume:Normal  Handedness:Right   Mood and Affect  Mood:Depressed  Affect:Congruent   Thought Process  Thought Processes:Coherent  Descriptions  of Associations:Intact  Orientation:Full (Time, Place and Person)  Thought Content:WDL  History of Schizophrenia/Schizoaffective disorder:No  Duration of Psychotic Symptoms:No data recorded Hallucinations:No data recorded Ideas of Reference:None  Suicidal Thoughts:No data recorded Homicidal Thoughts:No data recorded  Sensorium  Memory:Immediate Good  Judgment:Good  Insight:Good   Executive Functions  Concentration:Good  Attention Span:Good  Recall:Good  Fund of Knowledge:Good  Language:Good   Psychomotor Activity  Psychomotor Activity:No data recorded  Assets  Assets:Communication Skills; Desire for Improvement; Financial Resources/Insurance; Housing; Resilience; Social Support; Physical Health   Sleep  Sleep:No data recorded   Physical Exam: Physical Exam Vitals and nursing note reviewed.  Constitutional:      Appearance: Normal appearance.  HENT:     Head: Normocephalic and atraumatic.     Mouth/Throat:     Pharynx: Oropharynx is clear.  Eyes:     Pupils: Pupils are equal, round, and reactive to light.  Cardiovascular:     Rate and Rhythm: Normal rate and regular rhythm.  Pulmonary:     Effort: Pulmonary effort is normal.     Breath sounds: Normal breath sounds.  Abdominal:     General: Abdomen is flat.     Palpations: Abdomen is soft.  Musculoskeletal:        General: Normal range of motion.  Skin:    General: Skin is warm and dry.  Neurological:     General: No focal deficit present.     Mental Status: She is alert. Mental status is at baseline.     Comments: Quadriplegia which is stable.  Still has some strength and meaningful use of upper extremities  Psychiatric:        Attention and Perception: Attention normal.        Mood and Affect: Mood is depressed.        Speech: Speech normal.  Behavior: Behavior normal.        Thought Content: Thought content includes suicidal ideation. Thought content does not include suicidal plan.         Cognition and Memory: Cognition normal.    Review of Systems  Constitutional: Negative.   HENT: Negative.    Eyes: Negative.   Respiratory: Negative.    Cardiovascular: Negative.   Gastrointestinal: Negative.   Musculoskeletal: Negative.   Skin: Negative.   Neurological: Negative.   Psychiatric/Behavioral:  Positive for depression and suicidal ideas. Negative for hallucinations and substance abuse. The patient is nervous/anxious.    Blood pressure (!) 123/94, pulse 93, temperature 98 F (36.7 C), temperature source Oral, resp. rate 18, height 5' (1.524 m), weight 76.7 kg, SpO2 92 %. Body mass index is 33.01 kg/m.   COGNITIVE FEATURES THAT CONTRIBUTE TO RISK:  Thought constriction (tunnel vision)    SUICIDE RISK:   Mild:  Suicidal ideation of limited frequency, intensity, duration, and specificity.  There are no identifiable plans, no associated intent, mild dysphoria and related symptoms, good self-control (both objective and subjective assessment), few other risk factors, and identifiable protective factors, including available and accessible social support.  PLAN OF CARE: Continue 15-minute checks.  Continue current medicine.  Engage in individual and group therapy and daily assessment while restarting ECT first treatment scheduled for tomorrow  I certify that inpatient services furnished can reasonably be expected to improve the patient's condition.   Mordecai Rasmussen, MD 07/29/2022, 2:33 PM

## 2022-07-29 NOTE — [facility] Group Notes (Signed)
[facility] Group Notes:  (Nursing/MHT/Case Management/Adjunct)  Date:  07/29/2022  Time:  9:44 AM  Type of Therapy:   community meeting  Participation Level:  Did Not Attend   Rodena Goldmann 07/29/2022, 9:44 AM

## 2022-07-30 ENCOUNTER — Inpatient Hospital Stay: Payer: 59 | Admitting: Anesthesiology

## 2022-07-30 ENCOUNTER — Encounter: Payer: Self-pay | Admitting: Psychiatry

## 2022-07-30 DIAGNOSIS — F332 Major depressive disorder, recurrent severe without psychotic features: Secondary | ICD-10-CM | POA: Diagnosis not present

## 2022-07-30 LAB — GLUCOSE, CAPILLARY: Glucose-Capillary: 111 mg/dL — ABNORMAL HIGH (ref 70–99)

## 2022-07-30 MED ORDER — METHOHEXITAL SODIUM 100 MG/10ML IV SOSY
PREFILLED_SYRINGE | INTRAVENOUS | Status: DC | PRN
  Administered 2022-07-30: 70 mg via INTRAVENOUS

## 2022-07-30 MED ORDER — ESMOLOL HCL 100 MG/10ML IV SOLN
INTRAVENOUS | Status: AC
  Filled 2022-07-30: qty 10

## 2022-07-30 MED ORDER — SUCCINYLCHOLINE CHLORIDE 200 MG/10ML IV SOSY
PREFILLED_SYRINGE | INTRAVENOUS | Status: DC | PRN
  Administered 2022-07-30: 80 mg via INTRAVENOUS

## 2022-07-30 MED ORDER — LABETALOL HCL 5 MG/ML IV SOLN
INTRAVENOUS | Status: DC | PRN
  Administered 2022-07-30: 5 mg via INTRAVENOUS

## 2022-07-30 MED ORDER — MIDAZOLAM HCL 2 MG/2ML IJ SOLN
INTRAMUSCULAR | Status: AC
  Filled 2022-07-30: qty 2

## 2022-07-30 MED ORDER — LABETALOL HCL 5 MG/ML IV SOLN
INTRAVENOUS | Status: AC
  Filled 2022-07-30: qty 4

## 2022-07-30 MED ORDER — MIDAZOLAM HCL 2 MG/2ML IJ SOLN
INTRAMUSCULAR | Status: DC | PRN
  Administered 2022-07-30: 2 mg via INTRAVENOUS

## 2022-07-30 MED ORDER — SODIUM CHLORIDE 0.9 % IV SOLN: INTRAVENOUS | Status: DC | PRN

## 2022-07-30 NOTE — Progress Notes (Signed)
Patient reports getting a little better but still suffering from depression. Reports  talking to doctor and is hoping to start ECT soon.  She denies si/hi/avh and anxiety at this encounter. Has chronic pain issues, but is not requesting anything for pain at this encounter.  She is med compliant and received medication without issues.  She is active on the unit and gets along well with her peers. Patient safe with q15 minute safety checks in place. Encouraged her to seek staff with any concerns.     C Butler-Nicholson, LPN

## 2022-07-30 NOTE — Progress Notes (Signed)
D: Patient alert and oriented. Patient rates 4/10  pain for chronic pain. Patient endorses anxiety and depression. Patient denies HI/AVH. Patient endorses passive SI. Patient contracts for safety. Patient did have ECT today  A: Scheduled medications administered to patient, per MD orders, while on the unit.  Support and encouragement provided to patient.  Q15 minute safety checks maintained.   R: Patient compliant with medication administration and treatment plan. No adverse drug reactions noted. Patient remains safe on the unit at this time.

## 2022-07-30 NOTE — Procedures (Signed)
ECT SERVICES Physician's Interval Evaluation & Treatment Note started Andren Bethea  Patient Identification: Natasha [name] MRN:  [id number] Date of Evaluation:  07/30/2022 TX #: 5 by the total standard, first in this hospitalization  MADRS:   MMSE:   P.E. Findings:  Quadriplegic stable strength  Psychiatric Interval Note:  Depressed suicidal ideation but cooperative and rational  Subjective:  Patient is a 50 y.o. female seen for evaluation for Electroconvulsive Therapy. Depressed with suicidal thoughts  Treatment Summary:   [x]   Right Unilateral             []  Bilateral   % Energy : 0.3 ms 55%   Impedance: 2290 ohms  Seizure Energy Index: 11,574 microvolts squared  Postictal Suppression Index: 85% 96%  Seizure Concordance Index: 96%  Medications  Pre Shock: Brevital 60 mg succinylcholine 80 mg  Post Shock: Versed 2 mg  Seizure Duration: 35 seconds EMG 42 seconds EEG   Comments: Next treatment Monday  Lungs:  [x]   Clear to auscultation               []  Other:   Heart:    [x]   Regular rhythm             []  irregular rhythm    [x]   Previous H&P reviewed, patient examined and there are NO CHANGES                 []   Previous H&P reviewed, patient examined and there are changes noted.   , MD 8/18/20232:40 PM

## 2022-07-30 NOTE — BH IP Treatment Plan (Signed)
Interdisciplinary Treatment and Diagnostic Plan Update  07/30/2022 Time of Session: 10:03 Natasha [name] MRN: [id number]  Principal Diagnosis: MDD (major depressive disorder), recurrent severe, without psychosis ([place])  Secondary Diagnoses: Principal Problem:   MDD (major depressive disorder), recurrent severe, without psychosis ([place]) Active Problems:   Suicidal ideation   Quadriplegia ([place])   Current Medications:  Current Facility-Administered Medications  Medication Dose Route Frequency Provider Last Rate Last Admin   acetaminophen (TYLENOL) tablet 650 mg  650 mg Oral Q6H PRN Waldon Merl F, NP   650 mg at 07/29/22 2126   alum & mag hydroxide-simeth (MAALOX/MYLANTA) 200-200-20 MG/5ML suspension 30 mL  30 mL Oral Q4H PRN Waldon Merl F, NP       baclofen (LIORESAL) tablet 10 mg  10 mg Oral TID Waldon Merl F, NP   10 mg at 07/30/22 0816   busPIRone (BUSPAR) tablet 10 mg  10 mg Oral TID Sherlon Handing, NP   10 mg at 07/30/22 0817   clonazePAM (KLONOPIN) tablet 0.5 mg  0.5 mg Oral BID PRN Waldon Merl F, NP   0.5 mg at 07/29/22 2114   DULoxetine (CYMBALTA) DR capsule 120 mg  120 mg Oral Daily Waldon Merl F, NP   120 mg at 07/30/22 0816   famotidine (PEPCID) tablet 20 mg  20 mg Oral Daily Waldon Merl F, NP   20 mg at 07/30/22 0818   hydrOXYzine (ATARAX) tablet 25 mg  25 mg Oral TID PRN Sherlon Handing, NP       magnesium hydroxide (MILK OF MAGNESIA) suspension 30 mL  30 mL Oral Daily PRN Waldon Merl F, NP       QUEtiapine (SEROQUEL) tablet 100 mg  100 mg Oral QHS Waldon Merl F, NP   100 mg at 07/29/22 2114   SUMAtriptan (IMITREX) tablet 100 mg  100 mg Oral Q2H PRN Waldon Merl F, NP   100 mg at 07/29/22 1024   PTA Medications: Medications Prior to Admission  Medication Sig Dispense Refill Last Dose   AUVELITY 45-105 MG TBCR Take 1 tablet by mouth 2 (two) times daily.      baclofen (LIORESAL) 10 MG tablet Take 10-20 mg by mouth 3  (three) times daily.      busPIRone (BUSPAR) 10 MG tablet Take 10 mg by mouth 3 (three) times daily.      clonazePAM (KLONOPIN) 0.5 MG tablet Take 0.5 mg by mouth 2 (two) times daily as needed.      DULoxetine (CYMBALTA) 60 MG capsule Take 120 mg by mouth daily.      famotidine (PEPCID) 20 MG tablet Take 1 tablet (20 mg total) by mouth daily. 30 tablet 1    QUEtiapine (SEROQUEL) 100 MG tablet Take 100 mg by mouth at bedtime.      SUMAtriptan (IMITREX) 100 MG tablet Take 1 tablet (100 mg total) by mouth every 2 (two) hours as needed for migraine. 10 tablet 1     Patient Stressors: Educational concerns    Patient Strengths: Motivation for treatment/growth   Treatment Modalities: Medication Management, Group therapy, Case management,  1 to 1 session with clinician, Psychoeducation, Recreational therapy.   Physician Treatment Plan for Primary Diagnosis: MDD (major depressive disorder), recurrent severe, without psychosis ([place]) Long Term Goal(s): Improvement in symptoms so as ready for discharge   Short Term Goals: Ability to maintain clinical measurements within normal limits will improve Ability to verbalize feelings will improve Ability to disclose and discuss suicidal ideas Ability to demonstrate self-control  will improve  Medication Management: Evaluate patient's response, side effects, and tolerance of medication regimen.  Therapeutic Interventions: 1 to 1 sessions, Unit Group sessions and Medication administration.  Evaluation of Outcomes: Not Met  Physician Treatment Plan for Secondary Diagnosis: Principal Problem:   MDD (major depressive disorder), recurrent severe, without psychosis ([place]) Active Problems:   Suicidal ideation   Quadriplegia ([place])  Long Term Goal(s): Improvement in symptoms so as ready for discharge   Short Term Goals: Ability to maintain clinical measurements within normal limits will improve Ability to verbalize feelings will improve Ability to disclose  and discuss suicidal ideas Ability to demonstrate self-control will improve     Medication Management: Evaluate patient's response, side effects, and tolerance of medication regimen.  Therapeutic Interventions: 1 to 1 sessions, Unit Group sessions and Medication administration.  Evaluation of Outcomes: Not Met   RN Treatment Plan for Primary Diagnosis: MDD (major depressive disorder), recurrent severe, without psychosis ([place]) Long Term Goal(s): Knowledge of disease and therapeutic regimen to maintain health will improve  Short Term Goals: Ability to remain free from injury will improve, Ability to verbalize frustration and anger appropriately will improve, Ability to demonstrate self-control, Ability to participate in decision making will improve, Ability to verbalize feelings will improve, Ability to disclose and discuss suicidal ideas, Ability to identify and develop effective coping behaviors will improve, and Compliance with prescribed medications will improve  Medication Management: RN will administer medications as ordered by provider, will assess and evaluate patient's response and provide education to patient for prescribed medication. RN will report any adverse and/or side effects to prescribing provider.  Therapeutic Interventions: 1 on 1 counseling sessions, Psychoeducation, Medication administration, Evaluate responses to treatment, Monitor vital signs and CBGs as ordered, Perform/monitor CIWA, COWS, AIMS and Fall Risk screenings as ordered, Perform wound care treatments as ordered.  Evaluation of Outcomes: Not Met   LCSW Treatment Plan for Primary Diagnosis: MDD (major depressive disorder), recurrent severe, without psychosis ([place]) Long Term Goal(s): Safe transition to appropriate next level of care at discharge, Engage patient in therapeutic group addressing interpersonal concerns.  Short Term Goals: Engage patient in aftercare planning with referrals and resources, Increase  social support, Increase ability to appropriately verbalize feelings, Increase emotional regulation, Facilitate acceptance of mental health diagnosis and concerns, and Increase skills for wellness and recovery  Therapeutic Interventions: Assess for all discharge needs, 1 to 1 time with Social worker, Explore available resources and support systems, Assess for adequacy in community support network, Educate family and significant other(s) on suicide prevention, Complete Psychosocial Assessment, Interpersonal group therapy.  Evaluation of Outcomes: Not Met   Progress in Treatment: Attending groups: No. Participating in groups: No. Taking medication as prescribed: Yes. Toleration medication: Yes. Family/Significant other contact made: No, will contact:  if given permission. Patient understands diagnosis: Yes. Discussing patient identified problems/goals with staff: Yes. Medical problems stabilized or resolved: Yes. Denies suicidal/homicidal ideation: No. Issues/concerns per patient self-inventory: No. Other: none.  New problem(s) identified: No, Describe:  none identified.  New Short Term/Long Term Goal(s): medication management for mood stabilization; elimination of SI thoughts; development of comprehensive mental wellness/sobriety plan.  Patient Goals:  "First and foremost, I need to feel better. And then with that I anticipate-my other focus is not having the negative symptoms of my depression."  Discharge Plan or Barriers: CSW will assist pt with development of an appropriate aftercare/discharge plan.   Reason for Continuation of Hospitalization: Depression Medication stabilization Suicidal ideation  Estimated Length of Stay: 1-7 days  Last 3 [place] Suicide Severity Risk Score: Flowsheet Row Admission (Current) from 07/28/2022 in [place] ED from 07/26/2022 in [facility] to Hosp-Admission (Discharged) from  06/10/2022 in [place] CATEGORY High Risk High Risk High Risk       Last PHQ 2/9 Scores:    06/07/2022    2:34 PM 06/24/2016    1:32 PM 11/04/2015    1:41 PM  Depression screen PHQ 2/9  Decreased Interest 3 1 3   Down, Depressed, Hopeless 3 1 3   PHQ - 2 Score 6 2 6   Altered sleeping 3 0 0  Tired, decreased energy 3 1 3   Change in appetite 3 0 0  Feeling bad or failure about yourself  3 3 3   Trouble concentrating 3 0 1  Moving slowly or fidgety/restless 3 0 0  Suicidal thoughts 3 1 3   PHQ-9 Score 27 7 16   Difficult doing work/chores Somewhat difficult Not difficult at all Somewhat difficult    Scribe for Treatment Team: Shirl Harris, LCSW 07/30/2022 11:13 AM

## 2022-07-30 NOTE — Transfer of Care (Signed)
Immediate Anesthesia Transfer of Care Note  Patient: Natasha [name]  Procedure(s) Performed: ECT TX  Patient Location: PACU  Anesthesia Type:General  Level of Consciousness: drowsy  Airway & Oxygen Therapy: Patient Spontanous Breathing  Post-op Assessment: Report given to RN and Post -op Vital signs reviewed and stable  Post vital signs: Reviewed and stable  Last Vitals:  Vitals Value Taken Time  BP    Temp    Pulse 82 07/30/22 1215  Resp 18 07/30/22 1215  SpO2 93 % 07/30/22 1215  Vitals shown include unvalidated device data.  Last Pain:  Vitals:   07/30/22 1138  TempSrc: Oral  PainSc: 4       Patients Stated Pain Goal: 0 (07/30/22 1138)  Complications: No notable events documented.

## 2022-07-30 NOTE — Anesthesia Preprocedure Evaluation (Signed)
Anesthesia Evaluation  Patient identified by MRN, date of birth, ID band Patient awake    Reviewed: Allergy & Precautions, NPO status , Patient's Chart, lab work & pertinent test results  History of Anesthesia Complications Negative for: history of anesthetic complications  Airway Mallampati: II  TM Distance: >3 FB Neck ROM: Full    Dental  (+) Poor Dentition   Pulmonary neg sleep apnea, neg COPD, Current Smoker and Patient abstained from smoking., former smoker,    breath sounds clear to auscultation- rhonchi (-) wheezing      Cardiovascular Exercise Tolerance: Good (-) hypertension(-) CAD, (-) Past MI, (-) Cardiac Stents and (-) CABG  Rhythm:Regular Rate:Normal - Systolic murmurs and - Diastolic murmurs    Neuro/Psych neg Seizures PSYCHIATRIC DISORDERS Anxiety Depression negative neurological ROS     GI/Hepatic negative GI ROS, Neg liver ROS,   Endo/Other  negative endocrine ROSneg diabetes  Renal/GU negative Renal ROS     Musculoskeletal negative musculoskeletal ROS (+)   Abdominal (+) - obese,   Peds  Hematology negative hematology ROS (+)   Anesthesia Other Findings Past Medical History: No date: Anxiety No date: C6 spinal cord injury (HCC) No date: Depression   Reproductive/Obstetrics                             Anesthesia Physical  Anesthesia Plan  ASA: II  Anesthesia Plan: General   Post-op Pain Management:    Induction: Intravenous  PONV Risk Score and Plan: 1 and Ondansetron  Airway Management Planned: Mask  Additional Equipment:   Intra-op Plan:   Post-operative Plan:   Informed Consent: I have reviewed the patients History and Physical, chart, labs and discussed the procedure including the risks, benefits and alternatives for the proposed anesthesia with the patient or authorized representative who has indicated his/her understanding and acceptance.      Dental advisory given  Plan Discussed with: CRNA and Anesthesiologist  Anesthesia Plan Comments:         Anesthesia Quick Evaluation

## 2022-07-30 NOTE — Progress Notes (Signed)
Recreation Therapy Notes  INPATIENT RECREATION THERAPY ASSESSMENT  Patient Details Name: Natasha [name] MRN: [id number] DOB: 01-Jun-1971 Today's Date: 07/30/2022       Information Obtained From: Patient  Able to Participate in Assessment/Interview: Yes  Patient Presentation: Responsive  Reason for Admission (Per Patient): Active Symptoms, Suicidal Ideation  Patient Stressors:    Coping Skills:   Isolation, Read, Deep Breathing (sit outside, research)  Leisure Interests (2+):  Individual - Reading, Social - Friends, Music - Listen, Individual - TV  Frequency of Recreation/Participation: Weekly  Awareness of Community Resources:  Yes  Community Resources:  Recreation Center  Current Use: Yes  If no, Barriers?:    Expressed Interest in [employer] Information: Yes  County of Residence:  [place]  Patient Main Form of Transportation: Therapist, music  Patient Strengths:  Automotive engineer, Forensic scientist  Patient Identified Areas of Improvement:  Stay on top of mental health  Patient Goal for Hospitalization:  Feel better  Current SI (including self-harm):  No  Current HI:  No  Current AVH: No  Staff Intervention Plan: Group Attendance, Collaborate with Interdisciplinary Treatment Team  Consent to Intern Participation: N/A  Ladarren Steiner 07/30/2022, 4:26 PM

## 2022-07-30 NOTE — Anesthesia Postprocedure Evaluation (Signed)
Anesthesia Post Note  Patient: Natasha [name]  Procedure(s) Performed: ECT TX  Patient location during evaluation: PACU Anesthesia Type: General Level of consciousness: awake and alert Pain management: pain level controlled Vital Signs Assessment: post-procedure vital signs reviewed and stable Respiratory status: spontaneous breathing, nonlabored ventilation, respiratory function stable and patient connected to nasal cannula oxygen Cardiovascular status: blood pressure returned to baseline and stable Postop Assessment: no apparent nausea or vomiting Anesthetic complications: no   No notable events documented.   Last Vitals:  Vitals:   07/30/22 1250 07/30/22 1312  BP: 125/86 125/83  Pulse: 81 80  Resp: 15 16  Temp: (!) 36.2 C 37.4 C  SpO2: 93% 94%    Last Pain:  Vitals:   07/30/22 1312  TempSrc: Oral  PainSc:                  Cleda Mccreedy Steffi Noviello

## 2022-07-30 NOTE — H&P (Signed)
Natasha [name] is an 51 y.o. female.   Chief Complaint: Severe recurrent depression with suicidal ideation HPI: Longstanding depression recurrent with usually a good response to ECT  Past Medical History:  Diagnosis Date   Anxiety    C6 spinal cord injury (HCC)    Depression    OCD (obsessive compulsive disorder)    PTSD (post-traumatic stress disorder)     Past Surgical History:  Procedure Laterality Date   BACK SURGERY     NECK SURGERY      Family History  Problem Relation Age of Onset   Alcohol abuse Father    Alcohol abuse Brother    Depression Mother    Alcohol abuse Mother    Parkinson's disease Mother    Social History:  reports that she has quit smoking. Her smoking use included cigarettes. She has never used smokeless tobacco. She reports current alcohol use. She reports that she does not use drugs.  Allergies: No Known Allergies  Medications Prior to Admission  Medication Sig Dispense Refill   AUVELITY 45-105 MG TBCR Take 1 tablet by mouth 2 (two) times daily.     baclofen (LIORESAL) 10 MG tablet Take 10-20 mg by mouth 3 (three) times daily.     busPIRone (BUSPAR) 10 MG tablet Take 10 mg by mouth 3 (three) times daily.     clonazePAM (KLONOPIN) 0.5 MG tablet Take 0.5 mg by mouth 2 (two) times daily as needed.     DULoxetine (CYMBALTA) 60 MG capsule Take 120 mg by mouth daily.     famotidine (PEPCID) 20 MG tablet Take 1 tablet (20 mg total) by mouth daily. 30 tablet 1   QUEtiapine (SEROQUEL) 100 MG tablet Take 100 mg by mouth at bedtime.     SUMAtriptan (IMITREX) 100 MG tablet Take 1 tablet (100 mg total) by mouth every 2 (two) hours as needed for migraine. 10 tablet 1    Results for orders placed or performed during the hospital encounter of 07/28/22 (from the past 48 hour(s))  Glucose, capillary     Status: Abnormal   Collection Time: 07/30/22  6:48 AM  Result Value Ref Range   Glucose-Capillary 111 (H) 70 - 99 mg/dL    Comment: Glucose reference  range applies only to samples taken after fasting for at least 8 hours.   No results found.  Review of Systems  Constitutional: Negative.   HENT: Negative.    Eyes: Negative.   Respiratory: Negative.    Cardiovascular: Negative.   Gastrointestinal: Negative.   Musculoskeletal: Negative.   Skin: Negative.   Neurological: Negative.   Psychiatric/Behavioral:  Positive for dysphoric mood and suicidal ideas.     Blood pressure 125/83, pulse 80, temperature 99.3 F (37.4 C), temperature source Oral, resp. rate 16, height 5' (1.524 m), weight 76.7 kg, SpO2 94 %. Physical Exam Constitutional:      Appearance: She is well-developed.  HENT:     Head: Normocephalic and atraumatic.  Eyes:     Conjunctiva/sclera: Conjunctivae normal.     Pupils: Pupils are equal, round, and reactive to light.  Cardiovascular:     Heart sounds: Normal heart sounds.  Pulmonary:     Effort: Pulmonary effort is normal.  Abdominal:     Palpations: Abdomen is soft.  Musculoskeletal:        General: Normal range of motion.     Cervical back: Normal range of motion.  Skin:    General: Skin is warm and dry.  Neurological:  Mental Status: She is alert.     Comments: Quadriplegia with no movement below the waist.  Some preserved movement above the waist but with diminished strength.  Psychiatric:        Attention and Perception: Attention normal.        Mood and Affect: Mood is depressed.        Speech: Speech normal.        Behavior: Behavior is cooperative.        Thought Content: Thought content includes suicidal ideation. Thought content does not include suicidal plan.        Cognition and Memory: Cognition normal.      Assessment/Plan ECT today with continued treatment in the next week  Mordecai Rasmussen, MD 07/30/2022, 2:39 PM

## 2022-07-30 NOTE — Anesthesia Preprocedure Evaluation (Deleted)
Anesthesia Evaluation    Airway        Dental   Pulmonary former smoker,           Cardiovascular      Neuro/Psych    GI/Hepatic   Endo/Other    Renal/GU      Musculoskeletal   Abdominal   Peds  Hematology   Anesthesia Other Findings   Reproductive/Obstetrics                                                              Anesthesia Evaluation  Patient identified by MRN, date of birth, ID band Patient awake    Reviewed: Allergy & Precautions, H&P , NPO status , Patient's Chart, lab work & pertinent test results  History of Anesthesia Complications Negative for: history of anesthetic complications  Airway Mallampati: III  TM Distance: >3 FB Neck ROM: limited    Dental  (+) Chipped, Poor Dentition   Pulmonary neg shortness of breath, Current Smoker,    Pulmonary exam normal        Cardiovascular Exercise Tolerance: Good negative cardio ROS Normal cardiovascular exam     Neuro/Psych negative neurological ROS  negative psych ROS   GI/Hepatic negative GI ROS, Neg liver ROS,   Endo/Other  negative endocrine ROS  Renal/GU negative Renal ROS  negative genitourinary   Musculoskeletal   Abdominal   Peds  Hematology negative hematology ROS (+)   Anesthesia Other Findings Past Medical History: No date: Anxiety No date: C6 spinal cord injury (HCC) No date: Depression  Past Surgical History: No date: BACK SURGERY No date: NECK SURGERY  BMI    Body Mass Index: 28.32 kg/m      Reproductive/Obstetrics negative OB ROS                             Anesthesia Physical Anesthesia Plan  ASA: III  Anesthesia Plan: General   Post-op Pain Management:    Induction: Intravenous  PONV Risk Score and Plan: Propofol infusion and TIVA  Airway Management Planned: Mask  Additional Equipment:   Intra-op Plan:   Post-operative Plan:    Informed Consent: I have reviewed the patients History and Physical, chart, labs and discussed the procedure including the risks, benefits and alternatives for the proposed anesthesia with the patient or authorized representative who has indicated his/her understanding and acceptance.     Dental Advisory Given  Plan Discussed with: Anesthesiologist, CRNA and Surgeon  Anesthesia Plan Comments: (Patient consented for risks of anesthesia including but not limited to:  - adverse reactions to medications - risk of intubation if required - damage to eyes, teeth, lips or other oral mucosa - nerve damage due to positioning  - sore throat or hoarseness - Damage to heart, brain, nerves, lungs, other parts of body or loss of life  Patient voiced understanding.)        Anesthesia Quick Evaluation  Anesthesia Physical Anesthesia Plan Anesthesia Quick Evaluation

## 2022-07-30 NOTE — Progress Notes (Signed)
[facility] MD Progress Note  07/30/2022 2:36 PM Natasha [name]  MRN:  [id number] Subjective: Patient was seen and chart reviewed.  51 year old woman well known to our service who has recurrent major depression.  Came to the hospital voicing suicidal ideation and has had a return of parasuicidal behavior.  Patient today continued to endorse symptoms but has not shown any dangerous behavior and has been cooperative with treatment.  Electroconvulsive treatment was administered today and tolerated well without any complications. Principal Problem: MDD (major depressive disorder), recurrent severe, without psychosis (HCC) Diagnosis: Principal Problem:   MDD (major depressive disorder), recurrent severe, without psychosis (HCC) Active Problems:   Suicidal ideation   Quadriplegia (HCC)  Total Time spent with patient: 30 minutes  Past Psychiatric History: Past history of severe recurrent depression  Past Medical History:  Past Medical History:  Diagnosis Date   Anxiety    C6 spinal cord injury (HCC)    Depression    OCD (obsessive compulsive disorder)    PTSD (post-traumatic stress disorder)     Past Surgical History:  Procedure Laterality Date   BACK SURGERY     NECK SURGERY     Family History:  Family History  Problem Relation Age of Onset   Alcohol abuse Father    Alcohol abuse Brother    Depression Mother    Alcohol abuse Mother    Parkinson's disease Mother    Family Psychiatric  History: See previous Social History:  Social History   Substance and Sexual Activity  Alcohol Use Yes   Alcohol/week: 0.0 standard drinks of alcohol   Comment: Occasional use     Social History   Substance and Sexual Activity  Drug Use No    Social History   Socioeconomic History   Marital status: Single    Spouse name: Not on file   Number of children: Not on file   Years of education: Not on file   Highest education level: Not on file  Occupational History   Occupation:  Disability  Tobacco Use   Smoking status: Former    Packs/day: 0.00    Types: Cigarettes   Smokeless tobacco: Never  Vaping Use   Vaping Use: Never used  Substance and Sexual Activity   Alcohol use: Yes    Alcohol/week: 0.0 standard drinks of alcohol    Comment: Occasional use   Drug use: No   Sexual activity: Not Currently  Other Topics Concern   Not on file  Social History Narrative   Pt lives alone; receives outpatient psychiatry services through [facility].  Pt is on disability due to a spinal cord injury.    Social Determinants of Health   Financial Resource Strain: Not on file  Food Insecurity: Not on file  Transportation Needs: Not on file  Physical Activity: Not on file  Stress: Not on file  Social Connections: Not on file   Additional Social History:                         Sleep: Fair  Appetite:  Fair  Current Medications: Current Facility-Administered Medications  Medication Dose Route Frequency Provider Last Rate Last Admin   acetaminophen (TYLENOL) tablet 650 mg  650 mg Oral Q6H PRN Vanetta Mulders, NP   650 mg at 07/29/22 2126   alum & mag hydroxide-simeth (MAALOX/MYLANTA) 200-200-20 MG/5ML suspension 30 mL  30 mL Oral Q4H PRN Vanetta Mulders, NP       baclofen (LIORESAL)  tablet 10 mg  10 mg Oral TID Waldon Merl F, NP   10 mg at 07/30/22 0816   busPIRone (BUSPAR) tablet 10 mg  10 mg Oral TID Sherlon Handing, NP   10 mg at 07/30/22 [id number]   clonazePAM (KLONOPIN) tablet 0.5 mg  0.5 mg Oral BID PRN Waldon Merl F, NP   0.5 mg at 07/29/22 2114   DULoxetine (CYMBALTA) DR capsule 120 mg  120 mg Oral Daily Waldon Merl F, NP   120 mg at 07/30/22 0816   famotidine (PEPCID) tablet 20 mg  20 mg Oral Daily Waldon Merl F, NP   20 mg at 07/30/22 0818   hydrOXYzine (ATARAX) tablet 25 mg  25 mg Oral TID PRN Sherlon Handing, NP       magnesium hydroxide (MILK OF MAGNESIA) suspension 30 mL  30 mL Oral Daily PRN Waldon Merl  F, NP       QUEtiapine (SEROQUEL) tablet 100 mg  100 mg Oral QHS Waldon Merl F, NP   100 mg at 07/29/22 2114   SUMAtriptan (IMITREX) tablet 100 mg  100 mg Oral Q2H PRN Sherlon Handing, NP   100 mg at 07/29/22 1024    Lab Results:  Results for orders placed or performed during the hospital encounter of 07/28/22 (from the past 48 hour(s))  Glucose, capillary     Status: Abnormal   Collection Time: 07/30/22  6:48 AM  Result Value Ref Range   Glucose-Capillary 111 (H) 70 - 99 mg/dL    Comment: Glucose reference range applies only to samples taken after fasting for at least 8 hours.    Blood Alcohol level:  Lab Results  Component Value Date   ETH <10 07/26/2022   ETH <10 0000000    Metabolic Disorder Labs: Lab Results  Component Value Date   HGBA1C 5.1 01/27/2022   MPG 99.67 01/27/2022   MPG 85.32 10/16/2020   Lab Results  Component Value Date   PROLACTIN 121.9 (H) 02/03/2017   Lab Results  Component Value Date   CHOL 236 (H) 10/16/2020   TRIG 94 10/16/2020   HDL 78 10/16/2020   CHOLHDL 3.0 10/16/2020   VLDL 19 10/16/2020   LDLCALC 139 (H) 10/16/2020   LDLCALC 160 (H) 05/26/2020    Physical Findings: AIMS:  , ,  ,  ,    CIWA:    COWS:     Musculoskeletal: Strength & Muscle Tone: within normal limits Gait & Station: normal Patient leans: N/A  Psychiatric Specialty Exam:  Presentation  General Appearance: Appropriate for Environment  Eye Contact:Good  Speech:Clear and Coherent  Speech Volume:Normal  Handedness:Right   Mood and Affect  Mood:Depressed  Affect:Congruent   Thought Process  Thought Processes:Coherent  Descriptions of Associations:Intact  Orientation:Full (Time, Place and Person)  Thought Content:WDL  History of Schizophrenia/Schizoaffective disorder:No  Duration of Psychotic Symptoms:No data recorded Hallucinations:No data recorded Ideas of Reference:None  Suicidal Thoughts:No data recorded Homicidal Thoughts:No  data recorded  Sensorium  Memory:Immediate Good  Judgment:Good  Insight:Good   Executive Functions  Concentration:Good  Attention Span:Good  Dwight of Knowledge:Good  Language:Good   Psychomotor Activity  Psychomotor Activity:No data recorded  Assets  Assets:Communication Skills; Desire for Improvement; Financial Resources/Insurance; Housing; Resilience; Social Support; Physical Health   Sleep  Sleep:No data recorded   Physical Exam: Physical Exam Vitals and nursing note reviewed.  Constitutional:      Appearance: Normal appearance.  HENT:     Head: Normocephalic and atraumatic.  Mouth/Throat:     Pharynx: Oropharynx is clear.  Eyes:     Pupils: Pupils are equal, round, and reactive to light.  Cardiovascular:     Rate and Rhythm: Normal rate and regular rhythm.  Pulmonary:     Effort: Pulmonary effort is normal.     Breath sounds: Normal breath sounds.  Abdominal:     General: Abdomen is flat.     Palpations: Abdomen is soft.  Musculoskeletal:        General: Normal range of motion.  Skin:    General: Skin is warm and dry.  Neurological:     Mental Status: She is alert. Mental status is at baseline.     Comments: Patient has quadriplegia with no significant movement below the waist and decreased strength and dexterity in the upper extremities.  All of it is stable.  Psychiatric:        Attention and Perception: Attention normal.        Mood and Affect: Mood is depressed.        Speech: Speech normal.        Behavior: Behavior is cooperative.        Thought Content: Thought content includes suicidal ideation.        Cognition and Memory: Cognition normal.        Judgment: Judgment normal.    Review of Systems  Constitutional: Negative.   HENT: Negative.    Eyes: Negative.   Respiratory: Negative.    Cardiovascular: Negative.   Gastrointestinal: Negative.   Musculoskeletal: Negative.   Skin: Negative.   Neurological: Negative.    Psychiatric/Behavioral:  Positive for depression and suicidal ideas.    Blood pressure 125/83, pulse 80, temperature 99.3 F (37.4 C), temperature source Oral, resp. rate 16, height 5' (1.524 m), weight 76.7 kg, SpO2 94 %. Body mass index is 33.01 kg/m.   Treatment Plan Summary: Medication management and Plan continue current medication management with the addition of ECT.  ECT today was done and an affective seeming seizure was received without any difficulty.  Next treatment Monday.  Mordecai Rasmussen, MD 07/30/2022, 2:36 PM

## 2022-07-30 NOTE — Progress Notes (Signed)
Recreation Therapy Notes  INPATIENT RECREATION TR PLAN  Patient Details Name: Natasha [name] MRN: [id number] DOB: Sep 26, 1971 Today's Date: 07/30/2022  Rec Therapy Plan Is patient appropriate for Therapeutic Recreation?: Yes Treatment times per week: at least 3 Estimated Length of Stay: 5-7 days TR Treatment/Interventions: Group participation (Comment)  Discharge Criteria Pt will be discharged from therapy if:: Discharged Treatment plan/goals/alternatives discussed and agreed upon by:: Patient/family  Discharge Summary     Aahil Fredin 07/30/2022, 4:27 PM

## 2022-07-30 NOTE — Progress Notes (Signed)
Recreation Therapy Notes    Date: 07/30/2022   Time: 10:50 am     Location: Court yard       Behavioral response: N/A   Intervention Topic: Values    Discussion/Intervention: Patient refused to attend group.    Clinical Observations/Feedback:  Patient refused to attend group.    Daxen Lanum LRT/CTRS          Adarius Tigges 07/30/2022 11:06 AM

## 2022-07-31 DIAGNOSIS — F332 Major depressive disorder, recurrent severe without psychotic features: Secondary | ICD-10-CM | POA: Diagnosis not present

## 2022-07-31 NOTE — Progress Notes (Signed)
Patient appears flat but brightens upon approach no distress noted interacting with peers and staff, she denies SI/HI/AVH and complaint with medication regimen. 15 minutes safety checks maintained, will continue to monitor.

## 2022-07-31 NOTE — Progress Notes (Signed)
Pt observed to guarded, isolative to room majority of this shift. Out of bed for meals and medications. Denies HI, AVH and pain when assessed. Endorses passive SI without plan. Declined breakfast, ate 25% of lunch and ate 75%. Ambulatory in milieu via wheelchair without incident. Emotional support, encouragement and reassurance offered to pt this shift. Safety checks maintained at Q 15 minutes intervals without issues. Verbal education provided on all medications and effects monitored. Tolerates all PO intake well. Fluids offered, tolerated well. Pt remains safe in milieu.

## 2022-07-31 NOTE — Progress Notes (Signed)
[facility] MD Progress Note  07/31/2022 1:13 PM Natasha [name]  MRN:  [id number] Subjective: Natasha [name] seen on rounds.  Natasha [name] is taking her medications as prescribed and denies any side effects.  Natasha [name] contracts for safety in the hospital.  Natasha [name] does not have any complaints or any issues.  Principal Problem: MDD (major depressive disorder), recurrent severe, without psychosis (HCC) Diagnosis: Principal Problem:   MDD (major depressive disorder), recurrent severe, without psychosis (HCC) Active Problems:   Suicidal ideation   Quadriplegia (HCC)  Total Time spent with patient: 15 minutes  Past Psychiatric History:  Patient has a long history of recurrent depression.  Multiple suicide attempts.  Patient is partially paralyzed due to a suicide attempt several years ago.  ECT has been very effective for her in the past and Natasha [name] has had several ECT courses with good response.  Past Medical History:  Past Medical History:  Diagnosis Date   Anxiety    C6 spinal cord injury (HCC)    Depression    OCD (obsessive compulsive disorder)    PTSD (post-traumatic stress disorder)     Past Surgical History:  Procedure Laterality Date   BACK SURGERY     NECK SURGERY     Family History:  Family History  Problem Relation Age of Onset   Alcohol abuse Father    Alcohol abuse Brother    Depression Mother    Alcohol abuse Mother    Parkinson's disease Mother    Family Psychiatric  History: Alcohol abuse in the family Tobacco Screening:   Social History:  Social History   Substance and Sexual Activity  Alcohol Use Yes   Alcohol/week: 0.0 standard drinks of alcohol   Comment: Occasional use     Social History   Substance and Sexual Activity  Drug Use No    Social History   Socioeconomic History   Marital status: Single    Spouse name: Not on file   Number of children: Not on file   Years of education: Not on file   Highest education level: Not on file  Occupational History    Occupation: Disability  Tobacco Use   Smoking status: Former    Packs/day: 0.00    Types: Cigarettes   Smokeless tobacco: Never  Vaping Use   Vaping Use: Never used  Substance and Sexual Activity   Alcohol use: Yes    Alcohol/week: 0.0 standard drinks of alcohol    Comment: Occasional use   Drug use: No   Sexual activity: Not Currently  Other Topics Concern   Not on file  Social History Narrative   Pt lives alone; receives outpatient psychiatry services through [facility].  Pt is on disability due to a spinal cord injury.    Social Determinants of Health   Financial Resource Strain: Not on file  Food Insecurity: Not on file  Transportation Needs: Not on file  Physical Activity: Not on file  Stress: Not on file  Social Connections: Not on file   Additional Social History:                         Sleep: Good  Appetite:  Good  Current Medications: Current Facility-Administered Medications  Medication Dose Route Frequency Provider Last Rate Last Admin   acetaminophen (TYLENOL) tablet 650 mg  650 mg Oral Q6H PRN Vanetta Mulders, NP   650 mg at 07/29/22 2126   alum & mag hydroxide-simeth (MAALOX/MYLANTA) 200-200-20 MG/5ML suspension 30 mL  30 mL Oral Q4H PRN Gabriel Cirri F, NP       baclofen (LIORESAL) tablet 10 mg  10 mg Oral TID Gabriel Cirri F, NP   10 mg at 07/31/22 1202   busPIRone (BUSPAR) tablet 10 mg  10 mg Oral TID Vanetta Mulders, NP   10 mg at 07/31/22 1202   clonazePAM (KLONOPIN) tablet 0.5 mg  0.5 mg Oral BID PRN Gabriel Cirri F, NP   0.5 mg at 07/31/22 1159   DULoxetine (CYMBALTA) DR capsule 120 mg  120 mg Oral Daily Gabriel Cirri F, NP   120 mg at 07/31/22 0901   famotidine (PEPCID) tablet 20 mg  20 mg Oral Daily Gabriel Cirri F, NP   20 mg at 07/31/22 0901   hydrOXYzine (ATARAX) tablet 25 mg  25 mg Oral TID PRN Vanetta Mulders, NP       magnesium hydroxide (MILK OF MAGNESIA) suspension 30 mL  30 mL Oral Daily PRN  Gabriel Cirri F, NP       QUEtiapine (SEROQUEL) tablet 100 mg  100 mg Oral QHS Gabriel Cirri F, NP   100 mg at 07/30/22 2117   SUMAtriptan (IMITREX) tablet 100 mg  100 mg Oral Q2H PRN Vanetta Mulders, NP   100 mg at 07/29/22 1024    Lab Results:  Results for orders placed or performed during the hospital encounter of 07/28/22 (from the past 48 hour(s))  Glucose, capillary     Status: Abnormal   Collection Time: 07/30/22  6:48 AM  Result Value Ref Range   Glucose-Capillary 111 (H) 70 - 99 mg/dL    Comment: Glucose reference range applies only to samples taken after fasting for at least 8 hours.    Blood Alcohol level:  Lab Results  Component Value Date   ETH <10 07/26/2022   ETH <10 06/10/2022    Metabolic Disorder Labs: Lab Results  Component Value Date   HGBA1C 5.1 01/27/2022   MPG 99.67 01/27/2022   MPG 85.32 10/16/2020   Lab Results  Component Value Date   PROLACTIN 121.9 (H) 02/03/2017   Lab Results  Component Value Date   CHOL 236 (H) 10/16/2020   TRIG 94 10/16/2020   HDL 78 10/16/2020   CHOLHDL 3.0 10/16/2020   VLDL 19 10/16/2020   LDLCALC 139 (H) 10/16/2020   LDLCALC 160 (H) 05/26/2020    Physical Findings: AIMS: Facial and Oral Movements Muscles of Facial Expression: None, normal Lips and Perioral Area: None, normal Jaw: None, normal Tongue: None, normal,Extremity Movements Upper (arms, wrists, hands, fingers): None, normal Lower (legs, knees, ankles, toes): None, normal, Trunk Movements Neck, shoulders, hips: None, normal, Overall Severity Severity of abnormal movements (highest score from questions above): None, normal Incapacitation due to abnormal movements: None, normal Patient's awareness of abnormal movements (rate only patient's report): No Awareness, Dental Status Current problems with teeth and/or dentures?: No Does patient usually wear dentures?: No  CIWA:    COWS:     Musculoskeletal: Strength & Muscle Tone: within normal  limits Gait & Station: normal Patient leans: N/A  Psychiatric Specialty Exam:  Presentation  General Appearance: Appropriate for Environment  Eye Contact:Good  Speech:Clear and Coherent  Speech Volume:Normal  Handedness:Right   Mood and Affect  Mood:Depressed  Affect:Congruent   Thought Process  Thought Processes:Coherent  Descriptions of Associations:Intact  Orientation:Full (Time, Place and Person)  Thought Content:WDL  History of Schizophrenia/Schizoaffective disorder:No  Duration of Psychotic Symptoms:No data recorded Hallucinations:No data recorded Ideas of Reference:None  Suicidal Thoughts:No data recorded Homicidal Thoughts:No data recorded  Sensorium  Memory:Immediate Good  Judgment:Good  Insight:Good   Executive Functions  Concentration:Good  Attention Span:Good  Recall:Good  Fund of Knowledge:Good  Language:Good   Psychomotor Activity  Psychomotor Activity:No data recorded  Assets  Assets:Communication Skills; Desire for Improvement; Financial Resources/Insurance; Housing; Resilience; Social Support; Physical Health   Sleep  Sleep:No data recorded   Physical Exam: Physical Exam ROS Blood pressure 121/80, pulse 83, temperature 98.2 F (36.8 C), temperature source Oral, resp. rate 16, height 5' (1.524 m), weight 76.7 kg, SpO2 100 %. Body mass index is 33.01 kg/m.   Treatment Plan Summary: Daily contact with patient to assess and evaluate symptoms and progress in treatment, Medication management, and Plan continue current medications.  Sarina Ill, DO 07/31/2022, 1:13 PM

## 2022-08-01 DIAGNOSIS — F332 Major depressive disorder, recurrent severe without psychotic features: Secondary | ICD-10-CM | POA: Diagnosis not present

## 2022-08-01 NOTE — Progress Notes (Signed)
[facility] MD Progress Note  08/01/2022 1:33 PM Natasha [name]  MRN:  [id number] Subjective: Natasha [name] is seen on rounds.  She has been compliant with medications and denies any side effects.  She does not have any issues or complaints.  Principal Problem: MDD (major depressive disorder), recurrent severe, without psychosis ([place]) Diagnosis: Principal Problem:   MDD (major depressive disorder), recurrent severe, without psychosis ([place]) Active Problems:   Suicidal ideation   Quadriplegia ([place])  Total Time spent with patient: 15 minutes  Past Psychiatric History:  Patient has a long history of recurrent depression.  Multiple suicide attempts.  Patient is partially paralyzed due to a suicide attempt several years ago.  ECT has been very effective for her in the past and she has had several ECT courses with good response  Past Medical History:  Past Medical History:  Diagnosis Date   Anxiety    C6 spinal cord injury ([place])    Depression    OCD (obsessive compulsive disorder)    PTSD (post-traumatic stress disorder)     Past Surgical History:  Procedure Laterality Date   BACK SURGERY     NECK SURGERY     Family History:  Family History  Problem Relation Age of Onset   Alcohol abuse Father    Alcohol abuse Brother    Depression Mother    Alcohol abuse Mother    Parkinson's disease Mother     Social History:  Social History   Substance and Sexual Activity  Alcohol Use Yes   Alcohol/week: 0.0 standard drinks of alcohol   Comment: Occasional use     Social History   Substance and Sexual Activity  Drug Use No    Social History   Socioeconomic History   Marital status: Single    Spouse name: Not on file   Number of children: Not on file   Years of education: Not on file   Highest education level: Not on file  Occupational History   Occupation: Disability  Tobacco Use   Smoking status: Former    Packs/day: 0.00    Types: Cigarettes   Smokeless tobacco: Never   Vaping Use   Vaping Use: Never used  Substance and Sexual Activity   Alcohol use: Yes    Alcohol/week: 0.0 standard drinks of alcohol    Comment: Occasional use   Drug use: No   Sexual activity: Not Currently  Other Topics Concern   Not on file  Social History Narrative   Pt lives alone; receives outpatient psychiatry services through [facility].  Pt is on disability due to a spinal cord injury.    Social Determinants of Health   Financial Resource Strain: Not on file  Food Insecurity: Not on file  Transportation Needs: Not on file  Physical Activity: Not on file  Stress: Not on file  Social Connections: Not on file   Additional Social History:                         Sleep: Good  Appetite:  Good  Current Medications: Current Facility-Administered Medications  Medication Dose Route Frequency Provider Last Rate Last Admin   acetaminophen (TYLENOL) tablet 650 mg  650 mg Oral Q6H PRN Sherlon Handing, NP   650 mg at 07/29/22 2126   alum & mag hydroxide-simeth (MAALOX/MYLANTA) 200-200-20 MG/5ML suspension 30 mL  30 mL Oral Q4H PRN Waldon Merl F, NP       baclofen (LIORESAL) tablet 10 mg  10 mg Oral TID Vanetta Mulders, NP   10 mg at 08/01/22 1154   busPIRone (BUSPAR) tablet 10 mg  10 mg Oral TID Vanetta Mulders, NP   10 mg at 08/01/22 1155   clonazePAM (KLONOPIN) tablet 0.5 mg  0.5 mg Oral BID PRN Gabriel Cirri F, NP   0.5 mg at 07/31/22 2356   DULoxetine (CYMBALTA) DR capsule 120 mg  120 mg Oral Daily Gabriel Cirri F, NP   120 mg at 08/01/22 0810   famotidine (PEPCID) tablet 20 mg  20 mg Oral Daily Gabriel Cirri F, NP   20 mg at 08/01/22 7412   hydrOXYzine (ATARAX) tablet 25 mg  25 mg Oral TID PRN Vanetta Mulders, NP       magnesium hydroxide (MILK OF MAGNESIA) suspension 30 mL  30 mL Oral Daily PRN Gabriel Cirri F, NP       QUEtiapine (SEROQUEL) tablet 100 mg  100 mg Oral QHS Gabriel Cirri F, NP   100 mg at 07/31/22 2100    SUMAtriptan (IMITREX) tablet 100 mg  100 mg Oral Q2H PRN Vanetta Mulders, NP   100 mg at 07/29/22 1024    Lab Results: No results found for this or any previous visit (from the past 48 hour(s)).  Blood Alcohol level:  Lab Results  Component Value Date   ETH <10 07/26/2022   ETH <10 06/10/2022    Metabolic Disorder Labs: Lab Results  Component Value Date   HGBA1C 5.1 01/27/2022   MPG 99.67 01/27/2022   MPG 85.32 10/16/2020   Lab Results  Component Value Date   PROLACTIN 121.9 (H) 02/03/2017   Lab Results  Component Value Date   CHOL 236 (H) 10/16/2020   TRIG 94 10/16/2020   HDL 78 10/16/2020   CHOLHDL 3.0 10/16/2020   VLDL 19 10/16/2020   LDLCALC 139 (H) 10/16/2020   LDLCALC 160 (H) 05/26/2020    Physical Findings: AIMS: Facial and Oral Movements Muscles of Facial Expression: None, normal Lips and Perioral Area: None, normal Jaw: None, normal Tongue: None, normal,Extremity Movements Upper (arms, wrists, hands, fingers): None, normal Lower (legs, knees, ankles, toes): None, normal, Trunk Movements Neck, shoulders, hips: None, normal, Overall Severity Severity of abnormal movements (highest score from questions above): None, normal Incapacitation due to abnormal movements: None, normal Patient's awareness of abnormal movements (rate only patient's report): No Awareness, Dental Status Current problems with teeth and/or dentures?: No Does patient usually wear dentures?: No  CIWA:    COWS:     Musculoskeletal: Strength & Muscle Tone: within normal limits Gait & Station: normal Patient leans: N/A  Psychiatric Specialty Exam:  Presentation  General Appearance: Appropriate for Environment  Eye Contact:Good  Speech:Clear and Coherent  Speech Volume:Normal  Handedness:Right   Mood and Affect  Mood:Depressed  Affect:Congruent   Thought Process  Thought Processes:Coherent  Descriptions of Associations:Intact  Orientation:Full (Time, Place and  Person)  Thought Content:WDL  History of Schizophrenia/Schizoaffective disorder:No  Duration of Psychotic Symptoms:No data recorded Hallucinations:No data recorded Ideas of Reference:None  Suicidal Thoughts:No data recorded Homicidal Thoughts:No data recorded  Sensorium  Memory:Immediate Good  Judgment:Good  Insight:Good   Executive Functions  Concentration:Good  Attention Span:Good  Recall:Good  Fund of Knowledge:Good  Language:Good   Psychomotor Activity  Psychomotor Activity:No data recorded  Assets  Assets:Communication Skills; Desire for Improvement; Financial Resources/Insurance; Housing; Resilience; Social Support; Physical Health   Sleep  Sleep:No data recorded    Blood pressure 121/80, pulse 83, temperature 98.2 F (  36.8 C), temperature source Oral, resp. rate 16, height 5' (1.524 m), weight 76.7 kg, SpO2 100 %. Body mass index is 33.01 kg/m.   Treatment Plan Summary: Daily contact with patient to assess and evaluate symptoms and progress in treatment, Medication management, and Plan continue current medications.  Sarina Ill, DO 08/01/2022, 1:33 PM

## 2022-08-01 NOTE — Progress Notes (Signed)
D: Patient alert and oriented. Patient rates pain 4/10 for chronic pain. Patient endorses anxiety and depression 6/10. Patient denies SI/HI/AVH. Patient isolative to room during shift with exception to coming out for meals and medication.  A: Scheduled medications administered to patient, per MD orders. Support and encouragement provided to patient.  Q15 minute safety checks maintained.   R: Patient compliant with medication administration and treatment plan. No adverse drug reactions noted. Patient remains safe on the unit at this time.

## 2022-08-01 NOTE — Progress Notes (Signed)
Patient appears less anxious flat and  brightens upon approach no distress noted interacting with peers and staff, she denies SI/HI/AVH and complaint with medication regimen. 15 minutes safety checks maintained, will continue to monitor.

## 2022-08-01 NOTE — Group Note (Signed)
[facility] LCSW Group Therapy Note   Group Date: 08/01/2022 Start Time: 1300 End Time: 1400  Type of Therapy and Topic:  Group Therapy:  Feelings around Relapse and Recovery  Participation Level:  Did Not Attend   Mood:  Description of Group:    Patients in this group will discuss emotions they experience before and after a relapse. They will process how experiencing these feelings, or avoidance of experiencing them, relates to having a relapse. Facilitator will guide patients to explore emotions they have related to recovery. Patients will be encouraged to process which emotions are more powerful. They will be guided to discuss the emotional reaction significant others in their lives may have to patients' relapse or recovery. Patients will be assisted in exploring ways to respond to the emotions of others without this contributing to a relapse.  Therapeutic Goals: Patient will identify two or more emotions that lead to relapse for them:  Patient will identify two emotions that result when they relapse:  Patient will identify two emotions related to recovery:  Patient will demonstrate ability to communicate their needs through discussion and/or role plays.   Summary of Patient Progress: Patient did not attend group despite encouraged participation.    Therapeutic Modalities:   Cognitive Behavioral Therapy Solution-Focused Therapy Assertiveness Training Relapse Prevention Therapy   Natasha [name] W Sanora Cunanan, LCSWA 

## 2022-08-01 NOTE — Plan of Care (Signed)
  Problem: Health Behavior/Discharge Planning: Goal: Compliance with treatment plan for underlying cause of condition will improve Outcome: Progressing   Problem: Physical Regulation: Goal: Ability to maintain clinical measurements within normal limits will improve Outcome: Progressing   Problem: Safety: Goal: Periods of time without injury will increase Outcome: Progressing   Problem: Education: Goal: Knowledge of [place] [facility] Education information/materials will improve Outcome: Progressing Goal: Emotional status will improve Outcome: Progressing Goal: Mental status will improve Outcome: Progressing Goal: Verbalization of understanding the information provided will improve Outcome: Progressing

## 2022-08-02 ENCOUNTER — Encounter: Payer: Self-pay | Admitting: Psychiatry

## 2022-08-02 ENCOUNTER — Inpatient Hospital Stay: Payer: 59 | Admitting: Anesthesiology

## 2022-08-02 ENCOUNTER — Other Ambulatory Visit: Payer: Self-pay | Admitting: Psychiatry

## 2022-08-02 ENCOUNTER — Ambulatory Visit: Payer: 59

## 2022-08-02 DIAGNOSIS — F332 Major depressive disorder, recurrent severe without psychotic features: Secondary | ICD-10-CM | POA: Diagnosis not present

## 2022-08-02 LAB — GLUCOSE, CAPILLARY: Glucose-Capillary: 108 mg/dL — ABNORMAL HIGH (ref 70–99)

## 2022-08-02 MED ORDER — SUCCINYLCHOLINE CHLORIDE 200 MG/10ML IV SOSY
PREFILLED_SYRINGE | INTRAVENOUS | Status: AC
  Filled 2022-08-02: qty 40

## 2022-08-02 MED ORDER — SODIUM CHLORIDE 0.9 % IV SOLN
500.0000 mL | Freq: Once | INTRAVENOUS | Status: AC
Start: 2022-08-02 — End: 2022-08-02

## 2022-08-02 MED ORDER — MIDAZOLAM HCL 2 MG/2ML IJ SOLN
2.0000 mg | Freq: Once | INTRAMUSCULAR | Status: AC
  Administered 2022-08-02: 2 mg via INTRAVENOUS

## 2022-08-02 MED ORDER — SUCCINYLCHOLINE CHLORIDE 200 MG/10ML IV SOSY
PREFILLED_SYRINGE | INTRAVENOUS | Status: DC | PRN
  Administered 2022-08-02: 80 mg via INTRAVENOUS

## 2022-08-02 MED ORDER — MIDAZOLAM HCL 2 MG/2ML IJ SOLN
INTRAMUSCULAR | Status: AC
  Filled 2022-08-02: qty 2

## 2022-08-02 MED ORDER — METHOHEXITAL SODIUM 100 MG/10ML IV SOSY
PREFILLED_SYRINGE | INTRAVENOUS | Status: DC | PRN
  Administered 2022-08-02: 70 mg via INTRAVENOUS

## 2022-08-02 MED ORDER — LABETALOL HCL 5 MG/ML IV SOLN
INTRAVENOUS | Status: DC | PRN
  Administered 2022-08-02: 5 mg via INTRAVENOUS

## 2022-08-02 MED ORDER — ONDANSETRON HCL 4 MG/2ML IJ SOLN: 4.0000 mg | Freq: Once | INTRAMUSCULAR | Status: DC | PRN

## 2022-08-02 NOTE — Progress Notes (Signed)
Patient pleasant and cooperative with care. Noted in bed most of shift thus far with complaints of chronic pain. Prn given for sleep, Clonazepam given at patients request.  Denies SI, HI, AVH. Endorses anxiety and depression. No complaints voiced post ECT.  Encouragement and support provided. Safety checks maintained. Medications given as prescribed. Pt receptive and remains safe on unit with q 15 min checks.

## 2022-08-02 NOTE — Progress Notes (Signed)
[facility] MD Progress Note  08/02/2022 2:36 PM Natasha [name]  MRN:  [id number] Subjective: Follow-up patient with depression who has had 1 ECT treatment.  Mood is already a little better.  No suicidal thought.  No new complaints. Principal Problem: MDD (major depressive disorder), recurrent severe, without psychosis ([facility]) Diagnosis: Principal Problem:   MDD (major depressive disorder), recurrent severe, without psychosis ([facility]) Active Problems:   Suicidal ideation   Quadriplegia ([facility])  Total Time spent with patient: 30 minutes  Past Psychiatric History: Past history of recurrent severe depression with good response to ECT  Past Medical History:  Past Medical History:  Diagnosis Date   Anxiety    C6 spinal cord injury ([facility])    Depression    OCD (obsessive compulsive disorder)    PTSD (post-traumatic stress disorder)     Past Surgical History:  Procedure Laterality Date   BACK SURGERY     NECK SURGERY     Family History:  Family History  Problem Relation Age of Onset   Alcohol abuse Father    Alcohol abuse Brother    Depression Mother    Alcohol abuse Mother    Parkinson's disease Mother    Family Psychiatric  History: See previous Social History:  Social History   Substance and Sexual Activity  Alcohol Use Yes   Alcohol/week: 0.0 standard drinks of alcohol   Comment: Occasional use     Social History   Substance and Sexual Activity  Drug Use No    Social History   Socioeconomic History   Marital status: Single    Spouse name: Not on file   Number of children: Not on file   Years of education: Not on file   Highest education level: Not on file  Occupational History   Occupation: Disability  Tobacco Use   Smoking status: Former    Packs/day: 0.00    Types: Cigarettes   Smokeless tobacco: Never  Vaping Use   Vaping Use: Never used  Substance and Sexual Activity   Alcohol use: Yes    Alcohol/week: 0.0 standard drinks of alcohol    Comment:  Occasional use   Drug use: No   Sexual activity: Not Currently  Other Topics Concern   Not on file  Social History Narrative   Pt lives alone; receives outpatient psychiatry services through [facility].  Pt is on disability due to a spinal cord injury.    Social Determinants of Health   Financial Resource Strain: Not on file  Food Insecurity: Not on file  Transportation Needs: Not on file  Physical Activity: Not on file  Stress: Not on file  Social Connections: Not on file   Additional Social History:                         Sleep: Fair  Appetite:  Fair  Current Medications: Current Facility-Administered Medications  Medication Dose Route Frequency Provider Last Rate Last Admin   acetaminophen (TYLENOL) tablet 650 mg  650 mg Oral Q6H PRN Gabriel Cirri F, NP   650 mg at 07/29/22 2126   alum & mag hydroxide-simeth (MAALOX/MYLANTA) 200-200-20 MG/5ML suspension 30 mL  30 mL Oral Q4H PRN Gabriel Cirri F, NP       baclofen (LIORESAL) tablet 10 mg  10 mg Oral TID Vanetta Mulders, NP   10 mg at 08/02/22 0811   busPIRone (BUSPAR) tablet 10 mg  10 mg Oral TID Vanetta Mulders, NP  10 mg at 08/02/22 0810   clonazePAM (KLONOPIN) tablet 0.5 mg  0.5 mg Oral BID PRN Waldon Merl F, NP   0.5 mg at 08/01/22 2109   DULoxetine (CYMBALTA) DR capsule 120 mg  120 mg Oral Daily Waldon Merl F, NP   120 mg at 08/02/22 0810   famotidine (PEPCID) tablet 20 mg  20 mg Oral Daily Waldon Merl F, NP   20 mg at 08/02/22 [id number]   hydrOXYzine (ATARAX) tablet 25 mg  25 mg Oral TID PRN Sherlon Handing, NP       magnesium hydroxide (MILK OF MAGNESIA) suspension 30 mL  30 mL Oral Daily PRN Sherlon Handing, NP       QUEtiapine (SEROQUEL) tablet 100 mg  100 mg Oral QHS Waldon Merl F, NP   100 mg at 08/01/22 2109   SUMAtriptan (IMITREX) tablet 100 mg  100 mg Oral Q2H PRN Sherlon Handing, NP   100 mg at 07/29/22 1024    Lab Results:  Results for orders placed or  performed during the hospital encounter of 07/28/22 (from the past 48 hour(s))  Glucose, capillary     Status: Abnormal   Collection Time: 08/02/22  7:24 AM  Result Value Ref Range   Glucose-Capillary 108 (H) 70 - 99 mg/dL    Comment: Glucose reference range applies only to samples taken after fasting for at least 8 hours.   Comment 1 Notify RN     Blood Alcohol level:  Lab Results  Component Value Date   ETH <10 07/26/2022   ETH <10 0000000    Metabolic Disorder Labs: Lab Results  Component Value Date   HGBA1C 5.1 01/27/2022   MPG 99.67 01/27/2022   MPG 85.32 10/16/2020   Lab Results  Component Value Date   PROLACTIN 121.9 (H) 02/03/2017   Lab Results  Component Value Date   CHOL 236 (H) 10/16/2020   TRIG 94 10/16/2020   HDL 78 10/16/2020   CHOLHDL 3.0 10/16/2020   VLDL 19 10/16/2020   LDLCALC 139 (H) 10/16/2020   LDLCALC 160 (H) 05/26/2020    Physical Findings: AIMS: Facial and Oral Movements Muscles of Facial Expression: None, normal Lips and Perioral Area: None, normal Jaw: None, normal Tongue: None, normal,Extremity Movements Upper (arms, wrists, hands, fingers): None, normal Lower (legs, knees, ankles, toes): None, normal, Trunk Movements Neck, shoulders, hips: None, normal, Overall Severity Severity of abnormal movements (highest score from questions above): None, normal Incapacitation due to abnormal movements: None, normal Patient's awareness of abnormal movements (rate only patient's report): No Awareness, Dental Status Current problems with teeth and/or dentures?: No Does patient usually wear dentures?: No  CIWA:    COWS:     Musculoskeletal: Strength & Muscle Tone: within normal limits Gait & Station: unable to stand Patient leans: N/A  Psychiatric Specialty Exam:  Presentation  General Appearance: Appropriate for Environment  Eye Contact:Good  Speech:Clear and Coherent  Speech Volume:Normal  Handedness:Right   Mood and Affect   Mood:Depressed  Affect:Congruent   Thought Process  Thought Processes:Coherent  Descriptions of Associations:Intact  Orientation:Full (Time, Place and Person)  Thought Content:WDL  History of Schizophrenia/Schizoaffective disorder:No  Duration of Psychotic Symptoms:No data recorded Hallucinations:No data recorded Ideas of Reference:None  Suicidal Thoughts:No data recorded Homicidal Thoughts:No data recorded  Sensorium  Memory:Immediate Good  Judgment:Good  Insight:Good   Executive Functions  Concentration:Good  Attention Span:Good  Recall:Good  Fund of Knowledge:Good  Language:Good   Psychomotor Activity  Psychomotor Activity:No data recorded  Assets  Assets:Communication Skills; Desire for Improvement; Financial Resources/Insurance; Housing; Resilience; Social Support; Physical Health   Sleep  Sleep:No data recorded   Physical Exam: Physical Exam Vitals and nursing note reviewed.  Constitutional:      Appearance: Normal appearance.  HENT:     Head: Normocephalic and atraumatic.     Mouth/Throat:     Pharynx: Oropharynx is clear.  Eyes:     Pupils: Pupils are equal, round, and reactive to light.  Cardiovascular:     Rate and Rhythm: Normal rate and regular rhythm.  Pulmonary:     Effort: Pulmonary effort is normal.     Breath sounds: Normal breath sounds.  Abdominal:     General: Abdomen is flat.     Palpations: Abdomen is soft.  Musculoskeletal:        General: Normal range of motion.  Skin:    General: Skin is warm and dry.  Neurological:     Mental Status: She is alert.     Motor: Weakness present.  Psychiatric:        Attention and Perception: Attention normal.        Mood and Affect: Mood is anxious.        Speech: Speech normal.        Behavior: Behavior is cooperative.        Thought Content: Thought content normal.    Review of Systems  Constitutional: Negative.   HENT: Negative.    Eyes: Negative.   Respiratory:  Negative.    Cardiovascular: Negative.   Gastrointestinal: Negative.   Musculoskeletal: Negative.   Skin: Negative.   Neurological: Negative.   Psychiatric/Behavioral:  Positive for depression. Negative for suicidal ideas.    Blood pressure 130/87, pulse 80, temperature 97.6 F (36.4 C), resp. rate 19, height 5' (1.524 m), weight 76.7 kg, SpO2 93 %. Body mass index is 33.01 kg/m.   Treatment Plan Summary: Medication management and Plan continue current medication and ECT.  Second ECT treatment today.  We discussed options and she anticipates as did I that probably about 3 treatments in the hospital will be necessary.  Mordecai Rasmussen, MD 08/02/2022, 2:36 PM

## 2022-08-02 NOTE — Progress Notes (Signed)
Patient alert and oriented x 4, she appears less anxious flat and brightens upon approach no distress noted interacting with peers and staff, she denies SI/HI/AVH and complaint with medication regimen. 15 minutes safety checks maintained, will continue to monitor.

## 2022-08-02 NOTE — Group Note (Signed)
[facility] LCSW Group Therapy Note    Group Date: 08/02/2022 Start Time: 1300 End Time: 1400  Type of Therapy and Topic:  Group Therapy:  Overcoming Obstacles  Participation Level:  [facility] PARTICIPATION LEVEL: Did Not Attend   Description of Group:   In this group patients will be encouraged to explore what they see as obstacles to their own wellness and recovery. They will be guided to discuss their thoughts, feelings, and behaviors related to these obstacles. The group will process together ways to cope with barriers, with attention given to specific choices patients can make. Each patient will be challenged to identify changes they are motivated to make in order to overcome their obstacles. This group will be process-oriented, with patients participating in exploration of their own experiences as well as giving and receiving support and challenge from other group members.  Therapeutic Goals: 1. Patient will identify personal and current obstacles as they relate to admission. 2. Patient will identify barriers that currently interfere with their wellness or overcoming obstacles.  3. Patient will identify feelings, thought process and behaviors related to these barriers. 4. Patient will identify two changes they are willing to make to overcome these obstacles:    Summary of Patient Progress X   Therapeutic Modalities:   Cognitive Behavioral Therapy Solution Focused Therapy Motivational Interviewing Relapse Prevention Therapy   [name] R [name], LCSW 

## 2022-08-02 NOTE — H&P (Signed)
Natasha [name] is an 51 y.o. female.   Chief Complaint: Ongoing depression with passive suicidal ideation HPI: History of recurrent severe depression  Past Medical History:  Diagnosis Date   Anxiety    C6 spinal cord injury ([facility])    Depression    OCD (obsessive compulsive disorder)    PTSD (post-traumatic stress disorder)     Past Surgical History:  Procedure Laterality Date   BACK SURGERY     NECK SURGERY      Family History  Problem Relation Age of Onset   Alcohol abuse Father    Alcohol abuse Brother    Depression Mother    Alcohol abuse Mother    Parkinson's disease Mother    Social History:  reports that she has quit smoking. Her smoking use included cigarettes. She has never used smokeless tobacco. She reports current alcohol use. She reports that she does not use drugs.  Allergies: No Known Allergies  Medications Prior to Admission  Medication Sig Dispense Refill   AUVELITY 45-105 MG TBCR Take 1 tablet by mouth 2 (two) times daily.     baclofen (LIORESAL) 10 MG tablet Take 10-20 mg by mouth 3 (three) times daily.     busPIRone (BUSPAR) 10 MG tablet Take 10 mg by mouth 3 (three) times daily.     clonazePAM (KLONOPIN) 0.5 MG tablet Take 0.5 mg by mouth 2 (two) times daily as needed.     DULoxetine (CYMBALTA) 60 MG capsule Take 120 mg by mouth daily.     famotidine (PEPCID) 20 MG tablet Take 1 tablet (20 mg total) by mouth daily. 30 tablet 1   QUEtiapine (SEROQUEL) 100 MG tablet Take 100 mg by mouth at bedtime.     SUMAtriptan (IMITREX) 100 MG tablet Take 1 tablet (100 mg total) by mouth every 2 (two) hours as needed for migraine. 10 tablet 1    Results for orders placed or performed during the hospital encounter of 07/28/22 (from the past 48 hour(s))  Glucose, capillary     Status: Abnormal   Collection Time: 08/02/22  7:24 AM  Result Value Ref Range   Glucose-Capillary 108 (H) 70 - 99 mg/dL    Comment: Glucose reference range applies only to samples  taken after fasting for at least 8 hours.   Comment 1 Notify RN    No results found.  Review of Systems  Constitutional: Negative.   HENT: Negative.    Eyes: Negative.   Respiratory: Negative.    Cardiovascular: Negative.   Gastrointestinal: Negative.   Musculoskeletal: Negative.   Skin: Negative.   Neurological:  Positive for weakness.  Psychiatric/Behavioral:  Positive for dysphoric mood.   All other systems reviewed and are negative.   Blood pressure 119/73, pulse 88, temperature 98.2 F (36.8 C), temperature source Oral, resp. rate 18, height 5' (1.524 m), weight 76.7 kg, SpO2 100 %. Physical Exam Vitals reviewed.  Constitutional:      Appearance: She is well-developed.  HENT:     Head: Normocephalic and atraumatic.  Eyes:     Conjunctiva/sclera: Conjunctivae normal.     Pupils: Pupils are equal, round, and reactive to light.  Cardiovascular:     Heart sounds: Normal heart sounds.  Pulmonary:     Effort: Pulmonary effort is normal.  Abdominal:     Palpations: Abdomen is soft.  Musculoskeletal:        General: Normal range of motion.     Cervical back: Normal range of motion.  Skin:  General: Skin is warm and dry.  Neurological:     Mental Status: She is alert. Mental status is at baseline.  Psychiatric:        Attention and Perception: Attention normal.        Mood and Affect: Affect is blunt.        Thought Content: Thought content includes suicidal ideation. Thought content does not include suicidal plan.      Assessment/Plan Continue ECT course today next scheduled for Wednesday  Mordecai Rasmussen, MD 08/02/2022, 11:30 AM

## 2022-08-02 NOTE — Progress Notes (Signed)
D: Patient alert and oriented. Patient rates pain 4/10 for chronic pain. Patient endorses anxiety and depression, with a rating of 6/10. Patient denies SI/HI/AVH. Patient tolerated ECT well.  Patient remains isolative to room after returning for ECT, with the exception to coming out for meals and medication.   A: Scheduled medications administered to patient, per MD orders.  Support and encouragement provided to patient.  Q15 minute safety checks maintained.   R: Patient compliant with medication administration and treatment plan. No adverse drug reactions noted. Patient remains safe on the unit at this time.

## 2022-08-02 NOTE — Progress Notes (Signed)
Recreation Therapy Notes  Date: 08/02/2022   Time: 10:05 am     Location: Court yard       Behavioral response: N/A   Intervention Topic: Strengths     Discussion/Intervention: Patient refused to attend group.    Clinical Observations/Feedback:  Patient refused to attend group.    Darina Hartwell LRT/CTRS        Clearence Vitug 08/02/2022 10:16 AM

## 2022-08-02 NOTE — Transfer of Care (Signed)
Immediate Anesthesia Transfer of Care Note  Patient: Natasha [name]  Procedure(s) Performed: ECT TX  Patient Location: PACU  Anesthesia Type:General  Level of Consciousness: drowsy  Airway & Oxygen Therapy: Patient Spontanous Breathing  Post-op Assessment: Report given to RN and Post -op Vital signs reviewed and stable  Post vital signs: Reviewed and stable  Last Vitals:  Vitals Value Taken Time  BP 143/90 08/02/22 1410  Temp 36.4 C 08/02/22 1410  Pulse    Resp 17 08/02/22 1414  SpO2 96 % 08/02/22 1410  Vitals shown include unvalidated device data.  Last Pain:  Vitals:   08/02/22 1410  TempSrc:   PainSc: 0-No pain      Patients Stated Pain Goal: 0 (07/30/22 1138)  Complications: No notable events documented.

## 2022-08-02 NOTE — Plan of Care (Signed)
  Problem: Safety: Goal: Ability to remain free from injury will improve Outcome: Progressing   Problem: Education: Goal: Knowledge of [facility] Education information/materials will improve Outcome: Progressing Goal: Emotional status will improve Outcome: Progressing   

## 2022-08-02 NOTE — Anesthesia Preprocedure Evaluation (Signed)
Anesthesia Evaluation  Patient identified by MRN, date of birth, ID band Patient awake    Reviewed: Allergy & Precautions, NPO status , Patient's Chart, lab work & pertinent test results  History of Anesthesia Complications Negative for: history of anesthetic complications  Airway Mallampati: II  TM Distance: >3 FB Neck ROM: Full    Dental  (+) Poor Dentition   Pulmonary neg sleep apnea, neg COPD, Current Smoker and Patient abstained from smoking., former smoker,    breath sounds clear to auscultation- rhonchi (-) wheezing      Cardiovascular Exercise Tolerance: Good (-) hypertension(-) CAD, (-) Past MI, (-) Cardiac Stents and (-) CABG  Rhythm:Regular Rate:Normal - Systolic murmurs and - Diastolic murmurs    Neuro/Psych neg Seizures PSYCHIATRIC DISORDERS Anxiety Depression negative neurological ROS     GI/Hepatic negative GI ROS, Neg liver ROS,   Endo/Other  negative endocrine ROSneg diabetes  Renal/GU negative Renal ROS     Musculoskeletal negative musculoskeletal ROS (+)   Abdominal (+) - obese,   Peds  Hematology negative hematology ROS (+)   Anesthesia Other Findings Past Medical History: No date: Anxiety No date: C6 spinal cord injury (HCC) No date: Depression   Reproductive/Obstetrics                             Anesthesia Physical  Anesthesia Plan  ASA: 2  Anesthesia Plan: General   Post-op Pain Management:    Induction: Intravenous  PONV Risk Score and Plan: 3 and Ondansetron  Airway Management Planned: Mask  Additional Equipment: None  Intra-op Plan:   Post-operative Plan:   Informed Consent: I have reviewed the patients History and Physical, chart, labs and discussed the procedure including the risks, benefits and alternatives for the proposed anesthesia with the patient or authorized representative who has indicated his/her understanding and acceptance.      Dental advisory given  Plan Discussed with: CRNA and Anesthesiologist  Anesthesia Plan Comments: (Discussed risks of anesthesia with patient, including PONV, muscle aches. Rare risks discussed as well, such as cardiorespiratory sequelae, need for airway intervention and its associated risks including lip/dental/eye damage and sore throat, and allergic reactions. Discussed the role of CRNA in patient's perioperative care. Patient understands.)        Anesthesia Quick Evaluation  

## 2022-08-02 NOTE — Anesthesia Postprocedure Evaluation (Signed)
Anesthesia Post Note  Patient: Natasha [name]  Procedure(s) Performed: ECT TX  Patient location during evaluation: PACU Anesthesia Type: General Level of consciousness: awake and alert Pain management: pain level controlled Vital Signs Assessment: post-procedure vital signs reviewed and stable Respiratory status: spontaneous breathing, nonlabored ventilation, respiratory function stable and patient connected to nasal cannula oxygen Cardiovascular status: blood pressure returned to baseline and stable Postop Assessment: no apparent nausea or vomiting Anesthetic complications: no   No notable events documented.   Last Vitals:  Vitals:   08/02/22 1415 08/02/22 1428  BP: (!) 142/100 130/87  Pulse:  80  Resp: 13 19  Temp:    SpO2: 91% 93%    Last Pain:  Vitals:   08/02/22 1428  TempSrc:   PainSc: 0-No pain                 Corinda Gubler

## 2022-08-02 NOTE — Procedures (Signed)
ECT SERVICES Physician's Interval Evaluation & Treatment Note  Patient Identification: Natasha [name] MRN:  [id number] Date of Evaluation:  08/02/2022 TX #: 6  MADRS:   MMSE:   P.E. Findings:  No change to physical exam  Psychiatric Interval Note:  Mood slightly better  Subjective:  Patient is a 51 y.o. female seen for evaluation for Electroconvulsive Therapy. Less depressed  Treatment Summary:   [x]   Right Unilateral             []  Bilateral   % Energy : 0.3 ms 55%   Impedance: 1610 ohms  Seizure Energy Index: 9234 V squared  Postictal Suppression Index: 92%  Seizure Concordance Index: 97%  Medications  Pre Shock: Brevital 60 mg succinylcholine 80 mg  Post Shock: Versed 2 mg  Seizure Duration: EMG 27 seconds EEG 44 seconds   Comments: Follow-up and 2 days  Lungs:  [x]   Clear to auscultation               []  Other:   Heart:    [x]   Regular rhythm             []  irregular rhythm    [x]   Previous H&P reviewed, patient examined and there are NO CHANGES                 []   Previous H&P reviewed, patient examined and there are changes noted.   , MD 8/21/20233:04 PM

## 2022-08-02 NOTE — Plan of Care (Signed)
  Problem: Education: Goal: Knowledge of [facility] Education information/materials will improve Outcome: Progressing Goal: Verbalization of understanding the information provided will improve Outcome: Progressing   Problem: Health Behavior/Discharge Planning: Goal: Compliance with treatment plan for underlying cause of condition will improve Outcome: Progressing   Problem: Physical Regulation: Goal: Ability to maintain clinical measurements within normal limits will improve Outcome: Progressing   Problem: Safety: Goal: Periods of time without injury will increase Outcome: Progressing   

## 2022-08-03 DIAGNOSIS — F332 Major depressive disorder, recurrent severe without psychotic features: Secondary | ICD-10-CM | POA: Diagnosis not present

## 2022-08-03 NOTE — [facility] Group Notes (Signed)
Group Therapy***wrap up   Participation Level:  Minimal  Participation Quality:  Appropriate  Affect:  Flat  Cognitive:  Alert, Appropriate, and Oriented  Insight:  Appropriate  Engagement in Group:  None  Modes of Intervention:  Discussion  Summary of Progress/Problems: The focus of this group is to help patients review their daily goal of treatment and discuss progress on daily workbooks. Maglione,Blossie Raffel E 08/03/2022, 11:20 PM

## 2022-08-03 NOTE — Plan of Care (Signed)
Patient during assessments this morning endorsed a continuing to improve mood and affect was relaxed. Pt. Denied si/hi/avh and endorsed ability to continue to remain safe on the unit. Pt. Orientation appeared grossly intact. Pt. Endorsed chronic physical pain under good control and endorsed toleration of medications thus far. Pt. Endorsed no problems with sleeping, reported eating has improved. Pt. 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   Patient has been complaint with medications and unit procedures thus far. Pt. Has been observed eating good thus far. Pt. Has been able to remain safe on the unit thus far.   Q x 15 minute observation checks in place/maintained for safety. Patient is provided with education throughout shift when appropriate and able.  Patient is given/offered medications per orders. Patient is encouraged to attend groups, participate in unit activities and continue with plan of care. Pt. Chart and plans of care reviewed. Pt. Given support and encouragement when appropriate and able.       Problem: Education: Goal: Knowledge of General Education information will improve Description: Including pain rating scale, medication(s)/side effects and non-pharmacologic comfort measures Outcome: Progressing Note: Verbalized understanding of information provided.   Problem: Safety: Goal: Ability to remain free from injury will improve Outcome: Progressing Note: Reports no si/hi/avh. Reports pain under control. Reports no falls.

## 2022-08-03 NOTE — Progress Notes (Signed)
[facility] MD Progress Note  08/03/2022 5:28 PM Natasha [name]  MRN:  [id number] Subjective: Follow-up patient with depression.  Mood better.  No active suicidal thoughts.  No complications from ECT Principal Problem: MDD (major depressive disorder), recurrent severe, without psychosis (HCC) Diagnosis: Principal Problem:   MDD (major depressive disorder), recurrent severe, without psychosis (HCC) Active Problems:   Suicidal ideation   Quadriplegia (HCC)  Total Time spent with patient: 20 minutes  Past Psychiatric History: Past history of recurrent severe depression  Past Medical History:  Past Medical History:  Diagnosis Date   Anxiety    C6 spinal cord injury (HCC)    Depression    OCD (obsessive compulsive disorder)    PTSD (post-traumatic stress disorder)     Past Surgical History:  Procedure Laterality Date   BACK SURGERY     NECK SURGERY     Family History:  Family History  Problem Relation Age of Onset   Alcohol abuse Father    Alcohol abuse Brother    Depression Mother    Alcohol abuse Mother    Parkinson's disease Mother    Family Psychiatric  History: See previous Social History:  Social History   Substance and Sexual Activity  Alcohol Use Yes   Alcohol/week: 0.0 standard drinks of alcohol   Comment: Occasional use     Social History   Substance and Sexual Activity  Drug Use No    Social History   Socioeconomic History   Marital status: Single    Spouse name: Not on file   Number of children: Not on file   Years of education: Not on file   Highest education level: Not on file  Occupational History   Occupation: Disability  Tobacco Use   Smoking status: Former    Packs/day: 0.00    Types: Cigarettes   Smokeless tobacco: Never  Vaping Use   Vaping Use: Never used  Substance and Sexual Activity   Alcohol use: Yes    Alcohol/week: 0.0 standard drinks of alcohol    Comment: Occasional use   Drug use: No   Sexual activity: Not Currently   Other Topics Concern   Not on file  Social History Narrative   Pt lives alone; receives outpatient psychiatry services through [facility].  Pt is on disability due to a spinal cord injury.    Social Determinants of Health   Financial Resource Strain: Not on file  Food Insecurity: Not on file  Transportation Needs: Not on file  Physical Activity: Not on file  Stress: Not on file  Social Connections: Not on file   Additional Social History:                         Sleep: Fair  Appetite:  Fair  Current Medications: Current Facility-Administered Medications  Medication Dose Route Frequency Provider Last Rate Last Admin   acetaminophen (TYLENOL) tablet 650 mg  650 mg Oral Q6H PRN Gabriel Cirri F, NP   650 mg at 07/29/22 2126   alum & mag hydroxide-simeth (MAALOX/MYLANTA) 200-200-20 MG/5ML suspension 30 mL  30 mL Oral Q4H PRN Gabriel Cirri F, NP       baclofen (LIORESAL) tablet 10 mg  10 mg Oral TID Gabriel Cirri F, NP   10 mg at 08/03/22 1617   busPIRone (BUSPAR) tablet 10 mg  10 mg Oral TID Vanetta Mulders, NP   10 mg at 08/03/22 1617   clonazePAM (KLONOPIN) tablet 0.5 mg  0.5 mg Oral BID PRN Gabriel Cirri F, NP   0.5 mg at 08/02/22 2104   DULoxetine (CYMBALTA) DR capsule 120 mg  120 mg Oral Daily Gabriel Cirri F, NP   120 mg at 08/03/22 0720   famotidine (PEPCID) tablet 20 mg  20 mg Oral Daily Gabriel Cirri F, NP   20 mg at 08/03/22 6160   hydrOXYzine (ATARAX) tablet 25 mg  25 mg Oral TID PRN Vanetta Mulders, NP       magnesium hydroxide (MILK OF MAGNESIA) suspension 30 mL  30 mL Oral Daily PRN Vanetta Mulders, NP       ondansetron [facility]) injection 4 mg  4 mg Intravenous Once PRN Corinda Gubler, MD       QUEtiapine (SEROQUEL) tablet 100 mg  100 mg Oral QHS Gabriel Cirri F, NP   100 mg at 08/02/22 2100   SUMAtriptan (IMITREX) tablet 100 mg  100 mg Oral Q2H PRN Vanetta Mulders, NP   100 mg at 08/02/22 1811    Lab Results:  Results  for orders placed or performed during the hospital encounter of 07/28/22 (from the past 48 hour(s))  Glucose, capillary     Status: Abnormal   Collection Time: 08/02/22  7:24 AM  Result Value Ref Range   Glucose-Capillary 108 (H) 70 - 99 mg/dL    Comment: Glucose reference range applies only to samples taken after fasting for at least 8 hours.   Comment 1 Notify RN     Blood Alcohol level:  Lab Results  Component Value Date   ETH <10 07/26/2022   ETH <10 06/10/2022    Metabolic Disorder Labs: Lab Results  Component Value Date   HGBA1C 5.1 01/27/2022   MPG 99.67 01/27/2022   MPG 85.32 10/16/2020   Lab Results  Component Value Date   PROLACTIN 121.9 (H) 02/03/2017   Lab Results  Component Value Date   CHOL 236 (H) 10/16/2020   TRIG 94 10/16/2020   HDL 78 10/16/2020   CHOLHDL 3.0 10/16/2020   VLDL 19 10/16/2020   LDLCALC 139 (H) 10/16/2020   LDLCALC 160 (H) 05/26/2020    Physical Findings: AIMS: Facial and Oral Movements Muscles of Facial Expression: None, normal Lips and Perioral Area: None, normal Jaw: None, normal Tongue: None, normal,Extremity Movements Upper (arms, wrists, hands, fingers): None, normal Lower (legs, knees, ankles, toes): None, normal, Trunk Movements Neck, shoulders, hips: None, normal, Overall Severity Severity of abnormal movements (highest score from questions above): None, normal Incapacitation due to abnormal movements: None, normal Patient's awareness of abnormal movements (rate only patient's report): No Awareness, Dental Status Current problems with teeth and/or dentures?: No Does patient usually wear dentures?: No  CIWA:    COWS:     Musculoskeletal: Strength & Muscle Tone: within normal limits Gait & Station: normal Patient leans: N/A  Psychiatric Specialty Exam:  Presentation  General Appearance: Appropriate for Environment  Eye Contact:Good  Speech:Clear and Coherent  Speech Volume:Normal  Handedness:Right   Mood  and Affect  Mood:Depressed  Affect:Congruent   Thought Process  Thought Processes:Coherent  Descriptions of Associations:Intact  Orientation:Full (Time, Place and Person)  Thought Content:WDL  History of Schizophrenia/Schizoaffective disorder:No  Duration of Psychotic Symptoms:No data recorded Hallucinations:No data recorded Ideas of Reference:None  Suicidal Thoughts:No data recorded Homicidal Thoughts:No data recorded  Sensorium  Memory:Immediate Good  Judgment:Good  Insight:Good   Executive Functions  Concentration:Good  Attention Span:Good  Recall:Good  Fund of Knowledge:Good  Language:Good   Psychomotor Activity  Psychomotor  Activity:No data recorded  Assets  Assets:Communication Skills; Desire for Improvement; Financial Resources/Insurance; Housing; Resilience; Social Support; Physical Health   Sleep  Sleep:No data recorded   Physical Exam: Physical Exam Vitals and nursing note reviewed.  Constitutional:      Appearance: Normal appearance.  HENT:     Head: Normocephalic and atraumatic.     Mouth/Throat:     Pharynx: Oropharynx is clear.  Eyes:     Pupils: Pupils are equal, round, and reactive to light.  Cardiovascular:     Rate and Rhythm: Normal rate and regular rhythm.  Pulmonary:     Effort: Pulmonary effort is normal.     Breath sounds: Normal breath sounds.  Abdominal:     General: Abdomen is flat.     Palpations: Abdomen is soft.  Musculoskeletal:        General: Normal range of motion.  Skin:    General: Skin is warm and dry.  Neurological:     Mental Status: She is alert. Mental status is at baseline.  Psychiatric:        Mood and Affect: Mood normal.        Thought Content: Thought content normal.    Review of Systems  Constitutional: Negative.   HENT: Negative.    Eyes: Negative.   Respiratory: Negative.    Cardiovascular: Negative.   Gastrointestinal: Negative.   Musculoskeletal: Negative.   Skin: Negative.    Neurological: Negative.   Psychiatric/Behavioral: Negative.     Blood pressure (!) 136/93, pulse 80, temperature 97.9 F (36.6 C), temperature source Oral, resp. rate 18, height 5' (1.524 m), weight 76.7 kg, SpO2 100 %. Body mass index is 33.01 kg/m.   Treatment Plan Summary: Medication management and Plan no change to medication.  ECT tomorrow followed by discussion about discharge planning  Alethia Berthold, MD 08/03/2022, 5:28 PM

## 2022-08-03 NOTE — Group Note (Signed)
[facility] LCSW Group Therapy Note   Group Date: 08/03/2022 Start Time: 1300 End Time: 1400  Type of Therapy/Topic:  Group Therapy:  Feelings about Diagnosis  Participation Level:  Active    Description of Group:    This group will allow patients to explore their thoughts and feelings about diagnoses they have received. Patients will be guided to explore their level of understanding and acceptance of these diagnoses. Facilitator will encourage patients to process their thoughts and feelings about the reactions of others to their diagnosis, and will guide patients in identifying ways to discuss their diagnosis with significant others in their lives. This group will be process-oriented, with patients participating in exploration of their own experiences as well as giving and receiving support and challenge from other group members.   Therapeutic Goals: 1. Patient will demonstrate understanding of diagnosis as evidence by identifying two or more symptoms of the disorder:  2. Patient will be able to express two feelings regarding the diagnosis 3. Patient will demonstrate ability to communicate their needs through discussion and/or role plays  Summary of Patient Progress: Patient was present for the entirety of group. She was actively involved in the conversation. Pt spoke about finding her diagnosis helpful because it gave her a direction and course of treatment to address it. She stated that it gave her a frame of reference. Pt acknowledged that sometimes people within her support system can be worn down when they know what she is facing and some even just leave her. She did share that knowing one's mental health diagnosis can give them a sense of dread, fear, and hopelessness. Pt talked a bit about the stigma that surrounds mental health and her hopes that it will begin to get better, acknowledging that the field has become more acceptable even now. Her comments and behavior were appropriate for the  group discussion. She appeared open and receptive to feedback/comments from both peers and facilitator.    Therapeutic Modalities:   Cognitive Behavioral Therapy Brief Therapy Feelings Identification    [name], LCSW

## 2022-08-04 ENCOUNTER — Other Ambulatory Visit: Payer: Self-pay | Admitting: Psychiatry

## 2022-08-04 ENCOUNTER — Inpatient Hospital Stay: Payer: 59 | Admitting: Certified Registered"

## 2022-08-04 ENCOUNTER — Ambulatory Visit: Payer: 59

## 2022-08-04 ENCOUNTER — Encounter: Payer: Self-pay | Admitting: Psychiatry

## 2022-08-04 DIAGNOSIS — F332 Major depressive disorder, recurrent severe without psychotic features: Secondary | ICD-10-CM | POA: Diagnosis not present

## 2022-08-04 MED ORDER — MIDAZOLAM HCL 2 MG/2ML IJ SOLN
INTRAMUSCULAR | Status: AC
  Filled 2022-08-04: qty 2

## 2022-08-04 MED ORDER — ONDANSETRON HCL 4 MG/2ML IJ SOLN
4.0000 mg | Freq: Once | INTRAMUSCULAR | Status: DC | PRN
Start: 2022-08-04 — End: 2022-08-05

## 2022-08-04 MED ORDER — METHOHEXITAL SODIUM 100 MG/10ML IV SOSY
PREFILLED_SYRINGE | INTRAVENOUS | Status: DC | PRN
  Administered 2022-08-04: 70 mg via INTRAVENOUS

## 2022-08-04 MED ORDER — MIDAZOLAM HCL 2 MG/2ML IJ SOLN
INTRAMUSCULAR | Status: DC | PRN
  Administered 2022-08-04: 2 mg via INTRAVENOUS

## 2022-08-04 MED ORDER — LABETALOL HCL 5 MG/ML IV SOLN
INTRAVENOUS | Status: DC | PRN
  Administered 2022-08-04: 5 mg via INTRAVENOUS

## 2022-08-04 MED ORDER — SUCCINYLCHOLINE CHLORIDE 200 MG/10ML IV SOSY
PREFILLED_SYRINGE | INTRAVENOUS | Status: DC | PRN
  Administered 2022-08-04: 80 mg via INTRAVENOUS

## 2022-08-04 MED ORDER — SODIUM CHLORIDE 0.9 % IV SOLN: 500.0000 mL | Freq: Once | INTRAVENOUS | Status: AC

## 2022-08-04 NOTE — Progress Notes (Signed)
Recreation Therapy Notes   Date: 08/04/2022  Time: 10:20 am    Location: Courtyard    Behavioral response: N/A   Intervention Topic: Wellness   Discussion/Intervention: Patient refused to attend group.   Clinical Observations/Feedback:  Patient refused to attend group.    Haliegh Khurana LRT/CTRS        Lochlan Grygiel 08/04/2022 12:22 PM

## 2022-08-04 NOTE — Progress Notes (Signed)
Patient is active on the unit,  In the dayroom reading or interacting with peer close in age.  She is  pleasant and cooperative.  Reports getting better. No longer endorsing si. She is med compliant and received meds without incident.  Minor aches and pains, but no medication requested.  Encouraged to seel staff with any concerns.  Safe on the unit with q15 safety checks.     C Butler-Nicholson, LPN

## 2022-08-04 NOTE — Progress Notes (Signed)
[facility] MD Progress Note  08/04/2022 11:14 AM Natasha [name]  MRN:  [id number] Subjective: Follow-up patient with depression.  Scheduled for ECT treatment #3 of this series.  Patient feels mood is improved.  Still a little anxious and uncertain Principal Problem: MDD (major depressive disorder), recurrent severe, without psychosis (HCC) Diagnosis: Principal Problem:   MDD (major depressive disorder), recurrent severe, without psychosis (HCC) Active Problems:   Suicidal ideation   Quadriplegia (HCC)  Total Time spent with patient: 30 minutes  Past Psychiatric History: Past history of recurrent depression and suicidality but good response to ECT  Past Medical History:  Past Medical History:  Diagnosis Date   Anxiety    C6 spinal cord injury (HCC)    Depression    OCD (obsessive compulsive disorder)    PTSD (post-traumatic stress disorder)     Past Surgical History:  Procedure Laterality Date   BACK SURGERY     NECK SURGERY     Family History:  Family History  Problem Relation Age of Onset   Alcohol abuse Father    Alcohol abuse Brother    Depression Mother    Alcohol abuse Mother    Parkinson's disease Mother    Family Psychiatric  History: See previous Social History:  Social History   Substance and Sexual Activity  Alcohol Use Yes   Alcohol/week: 0.0 standard drinks of alcohol   Comment: Occasional use     Social History   Substance and Sexual Activity  Drug Use No    Social History   Socioeconomic History   Marital status: Single    Spouse name: Not on file   Number of children: Not on file   Years of education: Not on file   Highest education level: Not on file  Occupational History   Occupation: Disability  Tobacco Use   Smoking status: Former    Packs/day: 0.00    Types: Cigarettes   Smokeless tobacco: Never  Vaping Use   Vaping Use: Never used  Substance and Sexual Activity   Alcohol use: Yes    Alcohol/week: 0.0 standard drinks of  alcohol    Comment: Occasional use   Drug use: No   Sexual activity: Not Currently  Other Topics Concern   Not on file  Social History Narrative   Pt lives alone; receives outpatient psychiatry services through [facility].  Pt is on disability due to a spinal cord injury.    Social Determinants of Health   Financial Resource Strain: Not on file  Food Insecurity: Not on file  Transportation Needs: Not on file  Physical Activity: Not on file  Stress: Not on file  Social Connections: Not on file   Additional Social History:                         Sleep: Fair  Appetite:  Fair  Current Medications: Current Facility-Administered Medications  Medication Dose Route Frequency Provider Last Rate Last Admin   0.9 %  sodium chloride infusion  500 mL Intravenous Once Jaydalyn Demattia, Jackquline Denmark, MD       acetaminophen (TYLENOL) tablet 650 mg  650 mg Oral Q6H PRN Gabriel Cirri F, NP   650 mg at 07/29/22 2126   alum & mag hydroxide-simeth (MAALOX/MYLANTA) 200-200-20 MG/5ML suspension 30 mL  30 mL Oral Q4H PRN Gabriel Cirri F, NP       baclofen (LIORESAL) tablet 10 mg  10 mg Oral TID Vanetta Mulders, NP  10 mg at 08/04/22 0807   busPIRone (BUSPAR) tablet 10 mg  10 mg Oral TID Vanetta Mulders, NP   10 mg at 08/04/22 1448   clonazePAM (KLONOPIN) tablet 0.5 mg  0.5 mg Oral BID PRN Gabriel Cirri F, NP   0.5 mg at 08/03/22 2133   DULoxetine (CYMBALTA) DR capsule 120 mg  120 mg Oral Daily Gabriel Cirri F, NP   120 mg at 08/04/22 0805   famotidine (PEPCID) tablet 20 mg  20 mg Oral Daily Gabriel Cirri F, NP   20 mg at 08/04/22 1856   hydrOXYzine (ATARAX) tablet 25 mg  25 mg Oral TID PRN Vanetta Mulders, NP       magnesium hydroxide (MILK OF MAGNESIA) suspension 30 mL  30 mL Oral Daily PRN Vanetta Mulders, NP       ondansetron [facility]) injection 4 mg  4 mg Intravenous Once PRN Corinda Gubler, MD       QUEtiapine (SEROQUEL) tablet 100 mg  100 mg Oral QHS Gabriel Cirri  F, NP   100 mg at 08/03/22 2133   SUMAtriptan (IMITREX) tablet 100 mg  100 mg Oral Q2H PRN Vanetta Mulders, NP   100 mg at 08/02/22 1811    Lab Results: No results found for this or any previous visit (from the past 48 hour(s)).  Blood Alcohol level:  Lab Results  Component Value Date   ETH <10 07/26/2022   ETH <10 06/10/2022    Metabolic Disorder Labs: Lab Results  Component Value Date   HGBA1C 5.1 01/27/2022   MPG 99.67 01/27/2022   MPG 85.32 10/16/2020   Lab Results  Component Value Date   PROLACTIN 121.9 (H) 02/03/2017   Lab Results  Component Value Date   CHOL 236 (H) 10/16/2020   TRIG 94 10/16/2020   HDL 78 10/16/2020   CHOLHDL 3.0 10/16/2020   VLDL 19 10/16/2020   LDLCALC 139 (H) 10/16/2020   LDLCALC 160 (H) 05/26/2020    Physical Findings: AIMS: Facial and Oral Movements Muscles of Facial Expression: None, normal Lips and Perioral Area: None, normal Jaw: None, normal Tongue: None, normal,Extremity Movements Upper (arms, wrists, hands, fingers): None, normal Lower (legs, knees, ankles, toes): None, normal, Trunk Movements Neck, shoulders, hips: None, normal, Overall Severity Severity of abnormal movements (highest score from questions above): None, normal Incapacitation due to abnormal movements: None, normal Patient's awareness of abnormal movements (rate only patient's report): No Awareness, Dental Status Current problems with teeth and/or dentures?: No Does patient usually wear dentures?: No  CIWA:    COWS:     Musculoskeletal: Strength & Muscle Tone: within normal limits Gait & Station: normal Patient leans: N/A  Psychiatric Specialty Exam:  Presentation  General Appearance: Appropriate for Environment  Eye Contact:Good  Speech:Clear and Coherent  Speech Volume:Normal  Handedness:Right   Mood and Affect  Mood:Depressed  Affect:Congruent   Thought Process  Thought Processes:Coherent  Descriptions of  Associations:Intact  Orientation:Full (Time, Place and Person)  Thought Content:WDL  History of Schizophrenia/Schizoaffective disorder:No  Duration of Psychotic Symptoms:No data recorded Hallucinations:No data recorded Ideas of Reference:None  Suicidal Thoughts:No data recorded Homicidal Thoughts:No data recorded  Sensorium  Memory:Immediate Good  Judgment:Good  Insight:Good   Executive Functions  Concentration:Good  Attention Span:Good  Recall:Good  Fund of Knowledge:Good  Language:Good   Psychomotor Activity  Psychomotor Activity:No data recorded  Assets  Assets:Communication Skills; Desire for Improvement; Financial Resources/Insurance; Housing; Resilience; Social Support; Physical Health   Sleep  Sleep:No data recorded  Physical Exam: Physical Exam Vitals reviewed.  Constitutional:      Appearance: Normal appearance.  HENT:     Head: Normocephalic and atraumatic.     Mouth/Throat:     Pharynx: Oropharynx is clear.  Eyes:     Pupils: Pupils are equal, round, and reactive to light.  Cardiovascular:     Rate and Rhythm: Normal rate and regular rhythm.  Pulmonary:     Effort: Pulmonary effort is normal.     Breath sounds: Normal breath sounds.  Abdominal:     General: Abdomen is flat.     Palpations: Abdomen is soft.  Musculoskeletal:        General: Normal range of motion.  Skin:    General: Skin is warm and dry.  Neurological:     Mental Status: She is alert. Mental status is at baseline.  Psychiatric:        Mood and Affect: Mood normal.        Thought Content: Thought content normal.    Review of Systems  Constitutional: Negative.   HENT: Negative.    Eyes: Negative.   Respiratory: Negative.    Cardiovascular: Negative.   Gastrointestinal: Negative.   Musculoskeletal: Negative.   Skin: Negative.   Neurological:  Positive for weakness.  Psychiatric/Behavioral: Negative.     Blood pressure (!) 132/90, pulse 92, temperature  98.7 F (37.1 C), temperature source Oral, resp. rate 18, height 5' (1.524 m), weight 76.7 kg, SpO2 98 %. Body mass index is 33.01 kg/m.   Treatment Plan Summary: Plan no change to treatment plan.  ECT today.  Reassess afterwards with possible discharge by tomorrow.  Mordecai Rasmussen, MD 08/04/2022, 11:14 AM

## 2022-08-04 NOTE — Plan of Care (Signed)
  Problem: Education: Goal: Knowledge of General Education information will improve Description: Including pain rating scale, medication(s)/side effects and non-pharmacologic comfort measures Outcome: Progressing   Problem: Health Behavior/Discharge Planning: Goal: Ability to manage health-related needs will improve Outcome: Progressing   Problem: Clinical Measurements: Goal: Ability to maintain clinical measurements within normal limits will improve Outcome: Progressing Goal: Will remain free from infection Outcome: Progressing Goal: Diagnostic test results will improve Outcome: Progressing Goal: Respiratory complications will improve Outcome: Progressing Goal: Cardiovascular complication will be avoided Outcome: Progressing   Problem: Safety: Goal: Periods of time without injury will increase Outcome: Progressing   Problem: Physical Regulation: Goal: Ability to maintain clinical measurements within normal limits will improve Outcome: Progressing   Problem: Health Behavior/Discharge Planning: Goal: Identification of resources available to assist in meeting health care needs will improve Outcome: Progressing Goal: Compliance with treatment plan for underlying cause of condition will improve Outcome: Progressing   Problem: Coping: Goal: Ability to verbalize frustrations and anger appropriately will improve Outcome: Progressing Goal: Ability to demonstrate self-control will improve Outcome: Progressing   

## 2022-08-04 NOTE — BH IP Treatment Plan (Signed)
Interdisciplinary Treatment and Diagnostic Plan Update  08/04/2022 Time of Session: 8:30AM Natasha [name] MRN: [id number]  Principal Diagnosis: MDD (major depressive disorder), recurrent severe, without psychosis (HCC)  Secondary Diagnoses: Principal Problem:   MDD (major depressive disorder), recurrent severe, without psychosis (HCC) Active Problems:   Suicidal ideation   Quadriplegia (HCC)   Current Medications:  Current Facility-Administered Medications  Medication Dose Route Frequency Provider Last Rate Last Admin   acetaminophen (TYLENOL) tablet 650 mg  650 mg Oral Q6H PRN Gabriel Cirri F, NP   650 mg at 07/29/22 2126   alum & mag hydroxide-simeth (MAALOX/MYLANTA) 200-200-20 MG/5ML suspension 30 mL  30 mL Oral Q4H PRN Gabriel Cirri F, NP       baclofen (LIORESAL) tablet 10 mg  10 mg Oral TID Gabriel Cirri F, NP   10 mg at 08/04/22 0623   busPIRone (BUSPAR) tablet 10 mg  10 mg Oral TID Vanetta Mulders, NP   10 mg at 08/04/22 7628   clonazePAM (KLONOPIN) tablet 0.5 mg  0.5 mg Oral BID PRN Gabriel Cirri F, NP   0.5 mg at 08/03/22 2133   DULoxetine (CYMBALTA) DR capsule 120 mg  120 mg Oral Daily Gabriel Cirri F, NP   120 mg at 08/04/22 0805   famotidine (PEPCID) tablet 20 mg  20 mg Oral Daily Gabriel Cirri F, NP   20 mg at 08/04/22 3151   hydrOXYzine (ATARAX) tablet 25 mg  25 mg Oral TID PRN Vanetta Mulders, NP       magnesium hydroxide (MILK OF MAGNESIA) suspension 30 mL  30 mL Oral Daily PRN Vanetta Mulders, NP       ondansetron (ZOFRAN) injection 4 mg  4 mg Intravenous Once PRN Corinda Gubler, MD       QUEtiapine (SEROQUEL) tablet 100 mg  100 mg Oral QHS Gabriel Cirri F, NP   100 mg at 08/03/22 2133   SUMAtriptan (IMITREX) tablet 100 mg  100 mg Oral Q2H PRN Gabriel Cirri F, NP   100 mg at 08/02/22 1811   PTA Medications: Medications Prior to Admission  Medication Sig Dispense Refill Last Dose   AUVELITY 45-105 MG TBCR Take 1 tablet by mouth  2 (two) times daily.      baclofen (LIORESAL) 10 MG tablet Take 10-20 mg by mouth 3 (three) times daily.      busPIRone (BUSPAR) 10 MG tablet Take 10 mg by mouth 3 (three) times daily.      clonazePAM (KLONOPIN) 0.5 MG tablet Take 0.5 mg by mouth 2 (two) times daily as needed.      DULoxetine (CYMBALTA) 60 MG capsule Take 120 mg by mouth daily.      famotidine (PEPCID) 20 MG tablet Take 1 tablet (20 mg total) by mouth daily. 30 tablet 1    QUEtiapine (SEROQUEL) 100 MG tablet Take 100 mg by mouth at bedtime.      SUMAtriptan (IMITREX) 100 MG tablet Take 1 tablet (100 mg total) by mouth every 2 (two) hours as needed for migraine. 10 tablet 1     Patient Stressors: Educational concerns    Patient Strengths: Motivation for treatment/growth   Treatment Modalities: Medication Management, Group therapy, Case management,  1 to 1 session with clinician, Psychoeducation, Recreational therapy.   Physician Treatment Plan for Primary Diagnosis: MDD (major depressive disorder), recurrent severe, without psychosis (HCC) Long Term Goal(s): Improvement in symptoms so as ready for discharge   Short Term Goals: Ability to maintain clinical measurements within normal  limits will improve Ability to verbalize feelings will improve Ability to disclose and discuss suicidal ideas Ability to demonstrate self-control will improve  Medication Management: Evaluate patient's response, side effects, and tolerance of medication regimen.  Therapeutic Interventions: 1 to 1 sessions, Unit Group sessions and Medication administration.  Evaluation of Outcomes: Progressing  Physician Treatment Plan for Secondary Diagnosis: Principal Problem:   MDD (major depressive disorder), recurrent severe, without psychosis (HCC) Active Problems:   Suicidal ideation   Quadriplegia (HCC)  Long Term Goal(s): Improvement in symptoms so as ready for discharge   Short Term Goals: Ability to maintain clinical measurements within  normal limits will improve Ability to verbalize feelings will improve Ability to disclose and discuss suicidal ideas Ability to demonstrate self-control will improve     Medication Management: Evaluate patient's response, side effects, and tolerance of medication regimen.  Therapeutic Interventions: 1 to 1 sessions, Unit Group sessions and Medication administration.  Evaluation of Outcomes: Progressing   RN Treatment Plan for Primary Diagnosis: MDD (major depressive disorder), recurrent severe, without psychosis (HCC) Long Term Goal(s): Knowledge of disease and therapeutic regimen to maintain health will improve  Short Term Goals: Ability to remain free from injury will improve, Ability to verbalize frustration and anger appropriately will improve, Ability to demonstrate self-control, Ability to participate in decision making will improve, Ability to verbalize feelings will improve, Ability to disclose and discuss suicidal ideas, Ability to identify and develop effective coping behaviors will improve, and Compliance with prescribed medications will improve  Medication Management: RN will administer medications as ordered by provider, will assess and evaluate patient's response and provide education to patient for prescribed medication. RN will report any adverse and/or side effects to prescribing provider.  Therapeutic Interventions: 1 on 1 counseling sessions, Psychoeducation, Medication administration, Evaluate responses to treatment, Monitor vital signs and CBGs as ordered, Perform/monitor CIWA, COWS, AIMS and Fall Risk screenings as ordered, Perform wound care treatments as ordered.  Evaluation of Outcomes: Progressing   LCSW Treatment Plan for Primary Diagnosis: MDD (major depressive disorder), recurrent severe, without psychosis (HCC) Long Term Goal(s): Safe transition to appropriate next level of care at discharge, Engage patient in therapeutic group addressing interpersonal  concerns.  Short Term Goals: Engage patient in aftercare planning with referrals and resources, Increase social support, Increase ability to appropriately verbalize feelings, Increase emotional regulation, Facilitate acceptance of mental health diagnosis and concerns, and Increase skills for wellness and recovery  Therapeutic Interventions: Assess for all discharge needs, 1 to 1 time with Social worker, Explore available resources and support systems, Assess for adequacy in community support network, Educate family and significant other(s) on suicide prevention, Complete Psychosocial Assessment, Interpersonal group therapy.  Evaluation of Outcomes: Progressing   Progress in Treatment: Attending groups: Yes. Participating in groups: Yes. Taking medication as prescribed: Yes. Toleration medication: Yes. Family/Significant other contact made: Yes, individual(s) contacted:  SPE completed with pt. Patient understands diagnosis: Yes. Discussing patient identified problems/goals with staff: Yes. Medical problems stabilized or resolved: Yes. Denies suicidal/homicidal ideation: Yes. Issues/concerns per patient self-inventory: No. Other: none.  New problem(s) identified: No, Describe:  none identified. 08/04/22 Update: No changes at this time.   New Short Term/Long Term Goal(s): medication management for mood stabilization; elimination of SI thoughts; development of comprehensive mental wellness/sobriety plan. 08/04/22 Update: No changes at this time.   Patient Goals:  "First and foremost, I need to feel better. And then with that I anticipate-my other focus is not having the negative symptoms of my depression." 08/04/22 Update:  No changes at this time.   Discharge Plan or Barriers: CSW will assist pt with development of an appropriate aftercare/discharge plan. 08/04/22 Update: No changes at this time.   Reason for Continuation of Hospitalization: Depression Medication stabilization Suicidal  ideation   Estimated Length of Stay: 1-7 days 08/04/22 Update: No changes at this time.  Last 3 [place] Suicide Severity Risk Score: Flowsheet Row Admission (Current) from 07/28/2022 in [facility] INPATIENT BEH[facility] MEDICINE ED from 07/26/2022 in [facility] to Hosp-Admission (Discharged) from 06/10/2022 in [facility] E[facility] MEDICAL UNIT  C-SSRS RISK CATEGORY Error: Q3, 4, or 5 should not be populated when Q2 is No High Risk High Risk       Last PHQ 2/9 Scores:    06/07/2022    2:34 PM 06/24/2016    1:32 PM 11/04/2015    1:41 PM  Depression screen PHQ 2/9  Decreased Interest 3 1 3   Down, Depressed, Hopeless 3 1 3   PHQ - 2 Score 6 2 6   Altered sleeping 3 0 0  Tired, decreased energy 3 1 3   Change in appetite 3 0 0  Feeling bad or failure about yourself  3 3 3   Trouble concentrating 3 0 1  Moving slowly or fidgety/restless 3 0 0  Suicidal thoughts 3 1 3   PHQ-9 Score 27 7 16   Difficult doing work/chores Somewhat difficult Not difficult at all Somewhat difficult    Scribe for Treatment Team: , LCSW 08/04/2022 8:49 AM

## 2022-08-04 NOTE — Procedures (Signed)
ECT SERVICES Physician's Interval Evaluation & Treatment Note  Patient Identification: Natasha [name] MRN:  [id number] Date of Evaluation:  08/04/2022 TX #: 7 (3 of this series)  MADRS:   MMSE:   P.E. Findings:  No change to baseline physical exam  Psychiatric Interval Note:  Mood improved.  Subjective:  Patient is a 51 y.o. female seen for evaluation for Electroconvulsive Therapy. No longer depressed or suicidal  Treatment Summary:   [x]   Right Unilateral             []  Bilateral   % Energy : 0.3 ms 55%   Impedance: 2260 ohms  Seizure Energy Index: No reading  Postictal Suppression Index: No reading  Seizure Concordance Index: 75%  Medications  Pre Shock: Brevital 60 mg succinylcholine 80 mg  Post Shock: Versed 2 mg  Seizure Duration: 17 seconds EMG 43 seconds EEG   Comments: Last of this series likely discharge tomorrow  Lungs:  [x]   Clear to auscultation               []  Other:   Heart:    [x]   Regular rhythm             []  irregular rhythm    [x]   Previous H&P reviewed, patient examined and there are NO CHANGES                 []   Previous H&P reviewed, patient examined and there are changes noted.   , MD 8/23/20234:44 PM

## 2022-08-04 NOTE — Group Note (Signed)
[facility] LCSW Group Therapy Note   Group Date: 08/04/2022 Start Time: 1300 End Time: 1400   Type of Therapy/Topic:  Group Therapy:  Emotion Regulation  Participation Level:  Did Not Attend    Description of Group:    The purpose of this group is to assist patients in learning to regulate negative emotions and experience positive emotions. Patients will be guided to discuss ways in which they have been vulnerable to their negative emotions. These vulnerabilities will be juxtaposed with experiences of positive emotions or situations, and patients challenged to use positive emotions to combat negative ones. Special emphasis will be placed on coping with negative emotions in conflict situations, and patients will process healthy conflict resolution skills.  Therapeutic Goals: Patient will identify two positive emotions or experiences to reflect on in order to balance out negative emotions:  Patient will label two or more emotions that they find the most difficult to experience:  Patient will be able to demonstrate positive conflict resolution skills through discussion or role plays:   Summary of Patient Progress: X   Therapeutic Modalities:   Cognitive Behavioral Therapy Feelings Identification Dialectical Behavioral Therapy   Sabrea Sankey R Luann Aspinwall, LCSW 

## 2022-08-04 NOTE — Anesthesia Preprocedure Evaluation (Signed)
Anesthesia Evaluation  Patient identified by MRN, date of birth, ID band Patient awake    Reviewed: Allergy & Precautions, NPO status , Patient's Chart, lab work & pertinent test results  History of Anesthesia Complications Negative for: history of anesthetic complications  Airway Mallampati: II  TM Distance: >3 FB Neck ROM: Full    Dental  (+) Poor Dentition   Pulmonary neg sleep apnea, neg COPD, Current Smoker and Patient abstained from smoking., former smoker,    breath sounds clear to auscultation- rhonchi (-) wheezing      Cardiovascular Exercise Tolerance: Good (-) hypertension(-) CAD, (-) Past MI, (-) Cardiac Stents and (-) CABG  Rhythm:Regular Rate:Normal - Systolic murmurs and - Diastolic murmurs    Neuro/Psych neg Seizures PSYCHIATRIC DISORDERS Anxiety Depression negative neurological ROS     GI/Hepatic negative GI ROS, Neg liver ROS,   Endo/Other  negative endocrine ROSneg diabetes  Renal/GU negative Renal ROS     Musculoskeletal negative musculoskeletal ROS (+)   Abdominal (+) - obese,   Peds  Hematology negative hematology ROS (+)   Anesthesia Other Findings Past Medical History: No date: Anxiety No date: C6 spinal cord injury (HCC) No date: Depression   Reproductive/Obstetrics                             Anesthesia Physical  Anesthesia Plan  ASA: 2  Anesthesia Plan: General   Post-op Pain Management:    Induction: Intravenous  PONV Risk Score and Plan: 3 and Ondansetron  Airway Management Planned: Mask  Additional Equipment: None  Intra-op Plan:   Post-operative Plan:   Informed Consent: I have reviewed the patients History and Physical, chart, labs and discussed the procedure including the risks, benefits and alternatives for the proposed anesthesia with the patient or authorized representative who has indicated his/her understanding and acceptance.      Dental advisory given  Plan Discussed with: CRNA and Anesthesiologist  Anesthesia Plan Comments: (Discussed risks of anesthesia with patient, including PONV, muscle aches. Rare risks discussed as well, such as cardiorespiratory sequelae, need for airway intervention and its associated risks including lip/dental/eye damage and sore throat, and allergic reactions. Discussed the role of CRNA in patient's perioperative care. Patient understands.)        Anesthesia Quick Evaluation

## 2022-08-04 NOTE — Progress Notes (Signed)
D: Patient alert and oriented. Patient denies pain. Patient rates anxiety and depression 4/10. Patient denies SI/HI/AVH. Patient tolerated ECT well. Patient says she it went well.  Patient has been isolative to room returning from ECT with exception to coming out for dinner and medication.Patient did have some complaints of a mild headache. PRN imitrex has been given to patient.  A: Scheduled medications administered to patient, per MD orders.  Support and encouragement provided to patient.  Q15 minute safety checks maintained.   R: Patient compliant with medication administration and treatment plan. No adverse drug reactions noted. Patient remains safe on the unit at this time.

## 2022-08-04 NOTE — H&P (Signed)
Deoni Ernesteen Mihalic is an 51 y.o. female.   Chief Complaint: No new complaint.  Mood improving HPI: Recurrent severe depression  Past Medical History:  Diagnosis Date   Anxiety    C6 spinal cord injury (HCC)    Depression    OCD (obsessive compulsive disorder)    PTSD (post-traumatic stress disorder)     Past Surgical History:  Procedure Laterality Date   BACK SURGERY     NECK SURGERY      Family History  Problem Relation Age of Onset   Alcohol abuse Father    Alcohol abuse Brother    Depression Mother    Alcohol abuse Mother    Parkinson's disease Mother    Social History:  reports that she has quit smoking. Her smoking use included cigarettes. She has never used smokeless tobacco. She reports current alcohol use. She reports that she does not use drugs.  Allergies: No Known Allergies  Medications Prior to Admission  Medication Sig Dispense Refill   AUVELITY 45-105 MG TBCR Take 1 tablet by mouth 2 (two) times daily.     baclofen (LIORESAL) 10 MG tablet Take 10-20 mg by mouth 3 (three) times daily.     busPIRone (BUSPAR) 10 MG tablet Take 10 mg by mouth 3 (three) times daily.     clonazePAM (KLONOPIN) 0.5 MG tablet Take 0.5 mg by mouth 2 (two) times daily as needed.     DULoxetine (CYMBALTA) 60 MG capsule Take 120 mg by mouth daily.     famotidine (PEPCID) 20 MG tablet Take 1 tablet (20 mg total) by mouth daily. 30 tablet 1   QUEtiapine (SEROQUEL) 100 MG tablet Take 100 mg by mouth at bedtime.     SUMAtriptan (IMITREX) 100 MG tablet Take 1 tablet (100 mg total) by mouth every 2 (two) hours as needed for migraine. 10 tablet 1    No results found for this or any previous visit (from the past 48 hour(s)). No results found.  Review of Systems  Constitutional: Negative.   HENT: Negative.    Eyes: Negative.   Respiratory: Negative.    Cardiovascular: Negative.   Gastrointestinal: Negative.   Musculoskeletal: Negative.   Skin: Negative.   Neurological:  Positive  for weakness.  Psychiatric/Behavioral: Negative.      Blood pressure (!) 132/90, pulse 92, temperature 98.7 F (37.1 C), temperature source Oral, resp. rate 18, height 5' (1.524 m), weight 76.7 kg, SpO2 98 %. Physical Exam Constitutional:      Appearance: She is well-developed.  HENT:     Head: Normocephalic and atraumatic.  Eyes:     Conjunctiva/sclera: Conjunctivae normal.     Pupils: Pupils are equal, round, and reactive to light.  Cardiovascular:     Heart sounds: Normal heart sounds.  Pulmonary:     Effort: Pulmonary effort is normal.  Abdominal:     Palpations: Abdomen is soft.  Musculoskeletal:        General: Normal range of motion.     Cervical back: Normal range of motion.  Skin:    General: Skin is warm and dry.  Neurological:     Mental Status: She is alert. Mental status is at baseline.  Psychiatric:        Mood and Affect: Mood normal.        Thought Content: Thought content normal.      Assessment/Plan Treatment today after which we may be at maximum benefit for index course and will reassess possible discharge soon  Jonny Ruiz  Riham Polyakov, MD 08/04/2022, 10:59 AM

## 2022-08-04 NOTE — Plan of Care (Signed)
  Problem: Education: Goal: Knowledge of [facility] Education information/materials will improve Outcome: Progressing Goal: Verbalization of understanding the information provided will improve Outcome: Progressing   Problem: Health Behavior/Discharge Planning: Goal: Identification of resources available to assist in meeting health care needs will improve Outcome: Progressing Goal: Compliance with treatment plan for underlying cause of condition will improve Outcome: Progressing   Problem: Physical Regulation: Goal: Ability to maintain clinical measurements within normal limits will improve Outcome: Progressing   Problem: Safety: Goal: Periods of time without injury will increase Outcome: Progressing

## 2022-08-04 NOTE — Anesthesia Postprocedure Evaluation (Signed)
Anesthesia Post Note  Patient: Natasha [name]  Procedure(s) Performed: ECT TX  Patient location during evaluation: PACU Anesthesia Type: General Level of consciousness: awake and alert Pain management: pain level controlled Vital Signs Assessment: post-procedure vital signs reviewed and stable Respiratory status: spontaneous breathing, nonlabored ventilation, respiratory function stable and patient connected to nasal cannula oxygen Cardiovascular status: blood pressure returned to baseline and stable Postop Assessment: no apparent nausea or vomiting Anesthetic complications: no   No notable events documented.   Last Vitals:  Vitals:   08/04/22 1315 08/04/22 1320  BP: (!) 144/81   Pulse: 80   Resp: 20   Temp: 36.4 C 36.4 C  SpO2: 96%     Last Pain:  Vitals:   08/04/22 1320  TempSrc: Temporal  PainSc: 0-No pain                 Corinda Gubler

## 2022-08-04 NOTE — Transfer of Care (Signed)
Immediate Anesthesia Transfer of Care Note  Patient: Natasha [name]  Procedure(s) Performed: ECT TX  Patient Location: PACU  Anesthesia Type:General  Level of Consciousness: drowsy  Airway & Oxygen Therapy: Patient Spontanous Breathing and Patient connected to nasal cannula oxygen  Post-op Assessment: Report given to RN and Post -op Vital signs reviewed and stable  Post vital signs: Reviewed and stable  Last Vitals:  Vitals Value Taken Time  BP    Temp    Pulse    Resp 13 08/04/22 1247  SpO2    Vitals shown include unvalidated device data.  Last Pain:  Vitals:   08/04/22 1110  TempSrc:   PainSc: 0-No pain      Patients Stated Pain Goal: 4 (08/03/22 0721)  Complications: No notable events documented.

## 2022-08-04 NOTE — [facility] Group Notes (Signed)
[facility] Group Notes:  (Nursing/MHT/Case Management/Adjunct)  Date:  08/04/2022  Time:  9:21 PM  Type of Therapy:   Wrap up Gr.  Participation Level:  Did Not Attend  Summary of Progress/Problems:  Natasha [name] 08/04/2022, 9:21 PM

## 2022-08-05 DIAGNOSIS — F332 Major depressive disorder, recurrent severe without psychotic features: Secondary | ICD-10-CM | POA: Diagnosis not present

## 2022-08-05 MED ORDER — QUETIAPINE FUMARATE 100 MG PO TABS: 100.0000 mg | ORAL_TABLET | Freq: Every day | ORAL | 0 refills | Status: AC

## 2022-08-05 MED ORDER — BACLOFEN 10 MG PO TABS
10.0000 mg | ORAL_TABLET | Freq: Three times a day (TID) | ORAL | 0 refills | Status: AC
Start: 2022-08-05 — End: ?

## 2022-08-05 MED ORDER — CLONAZEPAM 0.5 MG PO TABS: 0.5000 mg | ORAL_TABLET | Freq: Two times a day (BID) | ORAL | 0 refills | Status: AC | PRN

## 2022-08-05 MED ORDER — DULOXETINE HCL 60 MG PO CPEP: 120.0000 mg | ORAL_CAPSULE | Freq: Every day | ORAL | 0 refills | Status: AC

## 2022-08-05 MED ORDER — FAMOTIDINE 20 MG PO TABS: 20.0000 mg | ORAL_TABLET | Freq: Every day | ORAL | 0 refills | Status: AC

## 2022-08-05 MED ORDER — BUSPIRONE HCL 10 MG PO TABS: 10.0000 mg | ORAL_TABLET | Freq: Three times a day (TID) | ORAL | 0 refills | Status: AC

## 2022-08-05 MED ORDER — SUMATRIPTAN SUCCINATE 100 MG PO TABS: 100.0000 mg | ORAL_TABLET | ORAL | 0 refills | Status: AC | PRN

## 2022-08-05 NOTE — Plan of Care (Signed)
D: Pt alert and oriented. Pt rates depression 2/10, and anxiety 2/10. Pt goal: "Discharge plan." Pt reports energy level as low and concentration as being good. Pt reports sleep last night as being good. Pt did receive medications for sleep and did find them helpful. Pt denies experiencing any pain at this time. Pt denies experiencing any SI/HI, or AVH at this time.   A: Scheduled medications administered to pt, per MD orders. Support and encouragement provided. Frequent verbal contact made. Routine safety checks conducted q15 minutes.   R: No adverse drug reactions noted. Pt verbally contracts for safety at this time. Pt compliant with medications and treatment plan. Pt interacts well with others on the unit. Pt remains safe at this time. Plan of care ongoing.   Problem: Education: Goal: Knowledge of General Education information will improve Description: Including pain rating scale, medication(s)/side effects and non-pharmacologic comfort measures Outcome: Progressing   Problem: Nutrition: Goal: Adequate nutrition will be maintained Outcome: Progressing

## 2022-08-05 NOTE — [facility] Suicide Risk Assessment (Signed)
[facility] Discharge Suicide Risk Assessment   Principal Problem: MDD (major depressive disorder), recurrent severe, without psychosis (HCC) Discharge Diagnoses: Principal Problem:   MDD (major depressive disorder), recurrent severe, without psychosis (HCC) Active Problems:   Suicidal ideation   Quadriplegia (HCC)   Total Time spent with patient: 30 minutes  Musculoskeletal: Strength & Muscle Tone: within normal limits Gait & Station: normal Patient leans: N/A  Psychiatric Specialty Exam  Presentation  General Appearance: Appropriate for Environment  Eye Contact:Good  Speech:Clear and Coherent  Speech Volume:Normal  Handedness:Right   Mood and Affect  Mood:Depressed  Duration of Depression Symptoms: Greater than two weeks  Affect:Congruent   Thought Process  Thought Processes:Coherent  Descriptions of Associations:Intact  Orientation:Full (Time, Place and Person)  Thought Content:WDL  History of Schizophrenia/Schizoaffective disorder:No  Duration of Psychotic Symptoms:No data recorded Hallucinations:No data recorded Ideas of Reference:None  Suicidal Thoughts:No data recorded Homicidal Thoughts:No data recorded  Sensorium  Memory:Immediate Good  Judgment:Good  Insight:Good   Executive Functions  Concentration:Good  Attention Span:Good  Recall:Good  Fund of Knowledge:Good  Language:Good   Psychomotor Activity  Psychomotor Activity:No data recorded  Assets  Assets:Communication Skills; Desire for Improvement; Financial Resources/Insurance; Housing; Resilience; Social Support; Physical Health   Sleep  Sleep:No data recorded  Physical Exam: Physical Exam Vitals and nursing note reviewed.  Constitutional:      Appearance: Normal appearance.  HENT:     Head: Normocephalic and atraumatic.     Mouth/Throat:     Pharynx: Oropharynx is clear.  Eyes:     Pupils: Pupils are equal, round, and reactive to light.  Cardiovascular:     Rate  and Rhythm: Normal rate and regular rhythm.  Pulmonary:     Effort: Pulmonary effort is normal.     Breath sounds: Normal breath sounds.  Abdominal:     General: Abdomen is flat.     Palpations: Abdomen is soft.  Musculoskeletal:        General: Normal range of motion.  Skin:    General: Skin is warm and dry.  Neurological:     General: No focal deficit present.     Mental Status: She is alert. Mental status is at baseline.  Psychiatric:        Attention and Perception: Attention normal.        Mood and Affect: Mood normal.        Speech: Speech normal.        Behavior: Behavior normal.        Thought Content: Thought content normal.        Cognition and Memory: Cognition normal.        Judgment: Judgment normal.    Review of Systems  Constitutional: Negative.   HENT: Negative.    Eyes: Negative.   Respiratory: Negative.    Cardiovascular: Negative.   Gastrointestinal: Negative.   Musculoskeletal: Negative.   Skin: Negative.   Neurological: Negative.   Psychiatric/Behavioral: Negative.     Blood pressure 125/86, pulse 88, temperature 98.6 F (37 C), temperature source Oral, resp. rate 18, height 5' (1.524 m), weight 76.7 kg, SpO2 100 %. Body mass index is 33.01 kg/m.  Mental Status Per Nursing Assessment::   On Admission:  NA  Demographic Factors:  Caucasian and Living alone  Loss Factors: Difficulty getting in to school which has been a major life goal for her recently  Historical Factors: Prior suicide attempts and Impulsivity  Risk Reduction Factors:   Positive therapeutic relationship and Positive coping skills or problem  solving skills  Continued Clinical Symptoms:  Depression:   Impulsivity  Cognitive Features That Contribute To Risk:  None    Suicide Risk:  Minimal: No identifiable suicidal ideation.  Patients presenting with no risk factors but with morbid ruminations; may be classified as minimal risk based on the severity of the depressive  symptoms    Plan Of Care/Follow-up recommendations:  Other:  Patient is displaying upbeat affect appropriate affect and reports her mood is much better.  She denies any suicidal thoughts at all.  She has tolerated treatment well.  She shows good insight into her illness and has an appropriate outpatient treatment plan.  Plan is for discharge today with follow-up in the community.  Patient is aware she can contact us if she believes ECT would be helpful again in the future.  At this point does not meet commitment criteria and seems appropriate and safe for discharge  Mordecai Rasmussen, MD 08/05/2022, 10:56 AM

## 2022-08-05 NOTE — Discharge Summary (Signed)
Physician Discharge Summary Note  Patient:  Natasha [name] is an 51 y.o., female MRN:  [id number] DOB:  03-08-1971 Patient phone:  [phone] (home)  Patient address:   [address] [facility] [address],  Total Time spent with patient: 45 minutes  Date of Admission:  07/28/2022 Date of Discharge: 08/05/2022  Reason for Admission: Patient was admitted after presenting to the emergency room with a return of severe major depression with intrusive suicidal thoughts and request to resume ECT treatment  Principal Problem: MDD (major depressive disorder), recurrent severe, without psychosis ([place]) Discharge Diagnoses: Principal Problem:   MDD (major depressive disorder), recurrent severe, without psychosis ([place]) Active Problems:   Suicidal ideation   Quadriplegia ([place])   Past Psychiatric History: Patient has a long history of chronic recurrent episodes of severe depression accompanied by previous suicide attempts.  Chronic anxiety disorders as well.  Has had good response in the past to electroconvulsive therapy.  Past Medical History:  Past Medical History:  Diagnosis Date   Anxiety    C6 spinal cord injury ([place])    Depression    OCD (obsessive compulsive disorder)    PTSD (post-traumatic stress disorder)     Past Surgical History:  Procedure Laterality Date   BACK SURGERY     NECK SURGERY     Family History:  Family History  Problem Relation Age of Onset   Alcohol abuse Father    Alcohol abuse Brother    Depression Mother    Alcohol abuse Mother    Parkinson's disease Mother    Family Psychiatric  History: See previous Social History:  Social History   Substance and Sexual Activity  Alcohol Use Yes   Alcohol/week: 0.0 standard drinks of alcohol   Comment: Occasional use     Social History   Substance and Sexual Activity  Drug Use No    Social History   Socioeconomic History   Marital status: Single    Spouse name: Not on file    Number of children: Not on file   Years of education: Not on file   Highest education level: Not on file  Occupational History   Occupation: Disability  Tobacco Use   Smoking status: Former    Packs/day: 0.00    Types: Cigarettes   Smokeless tobacco: Never  Vaping Use   Vaping Use: Never used  Substance and Sexual Activity   Alcohol use: Yes    Alcohol/week: 0.0 standard drinks of alcohol    Comment: Occasional use   Drug use: No   Sexual activity: Not Currently  Other Topics Concern   Not on file  Social History Narrative   Pt lives alone; receives outpatient psychiatry services through [facility].  Pt is on disability due to a spinal cord injury.    Social Determinants of Health   Financial Resource Strain: Not on file  Food Insecurity: Not on file  Transportation Needs: Not on file  Physical Activity: Not on file  Stress: Not on file  Social Connections: Not on file    Hospital Course: Admitted to psychiatric unit.  Patient had no behavior problems on the unit.  She did not display any dangerous aggressive or suicidal behavior.  She was cooperative with intake and treatment and met with treatment team.  Patient was requesting to resume electroconvulsive therapy based on her previous good response to this treatment.  She received 3 right unilateral ECT treatments on the normal schedule while she was in the hospital and  showed great improvement in her mood.  This is consistent with previous encounters we have had.  Her mood is much better and she denies any suicidal thoughts.  She appears to be lucid with good insight.  No significant changes have been made to medicines.  Patient has outpatient prescriber arranged and will be continuing to work on arranging for outpatient therapy.  She knows that she can contact our service if needed in the future or return to the emergency room if depression returns to the point that she needs ECT.  Maintenance ECT has been discussed but  has not been possible and still was not possible because of her lack of transportation options.  Physical Findings: AIMS: Facial and Oral Movements Muscles of Facial Expression: None, normal Lips and Perioral Area: None, normal Jaw: None, normal Tongue: None, normal,Extremity Movements Upper (arms, wrists, hands, fingers): None, normal Lower (legs, knees, ankles, toes): None, normal, Trunk Movements Neck, shoulders, hips: None, normal, Overall Severity Severity of abnormal movements (highest score from questions above): None, normal Incapacitation due to abnormal movements: None, normal Patient's awareness of abnormal movements (rate only patient's report): No Awareness, Dental Status Current problems with teeth and/or dentures?: No Does patient usually wear dentures?: No  CIWA:    COWS:     Musculoskeletal: Strength & Muscle Tone: within normal limits Gait & Station: normal Patient leans: N/A   Psychiatric Specialty Exam:  Presentation  General Appearance: Appropriate for Environment  Eye Contact:Good  Speech:Clear and Coherent  Speech Volume:Normal  Handedness:Right   Mood and Affect  Mood:Depressed  Affect:Congruent   Thought Process  Thought Processes:Coherent  Descriptions of Associations:Intact  Orientation:Full (Time, Place and Person)  Thought Content:WDL  History of Schizophrenia/Schizoaffective disorder:No  Duration of Psychotic Symptoms:No data recorded Hallucinations:No data recorded Ideas of Reference:None  Suicidal Thoughts:No data recorded Homicidal Thoughts:No data recorded  Sensorium  Memory:Immediate Good  Judgment:Good  Insight:Good   Executive Functions  Concentration:Good  Attention Span:Good  Recall:Good  Fund of Knowledge:Good  Language:Good   Psychomotor Activity  Psychomotor Activity:No data recorded  Assets  Assets:Communication Skills; Desire for Improvement; Financial Resources/Insurance; Housing;  Resilience; Social Support; Physical Health   Sleep  Sleep:No data recorded   Physical Exam: Physical Exam Vitals and nursing note reviewed.  Constitutional:      Appearance: Normal appearance.  HENT:     Head: Normocephalic and atraumatic.     Mouth/Throat:     Pharynx: Oropharynx is clear.  Eyes:     Pupils: Pupils are equal, round, and reactive to light.  Cardiovascular:     Rate and Rhythm: Normal rate and regular rhythm.  Pulmonary:     Effort: Pulmonary effort is normal.     Breath sounds: Normal breath sounds.  Abdominal:     General: Abdomen is flat.     Palpations: Abdomen is soft.  Musculoskeletal:        General: Normal range of motion.  Skin:    General: Skin is warm and dry.  Neurological:     Mental Status: She is alert. Mental status is at baseline.  Psychiatric:        Attention and Perception: Attention normal.        Mood and Affect: Mood normal.        Speech: Speech normal.        Behavior: Behavior normal.        Thought Content: Thought content normal.        Cognition and Memory: Cognition normal.  Judgment: Judgment normal.    Review of Systems  Constitutional: Negative.   HENT: Negative.    Eyes: Negative.   Respiratory: Negative.    Cardiovascular: Negative.   Gastrointestinal: Negative.   Musculoskeletal: Negative.   Skin: Negative.   Neurological:  Positive for weakness.  Psychiatric/Behavioral: Negative.     Blood pressure 125/86, pulse 88, temperature 98.6 F (37 C), temperature source Oral, resp. rate 18, height 5' (1.524 m), weight 76.7 kg, SpO2 100 %. Body mass index is 33.01 kg/m.   Social History   Tobacco Use  Smoking Status Former   Packs/day: 0.00   Types: Cigarettes  Smokeless Tobacco Never   Tobacco Cessation:  N/A, patient does not currently use tobacco products   Blood Alcohol level:  Lab Results  Component Value Date   ETH <10 07/26/2022   ETH <10 59/56/3875    Metabolic Disorder Labs:  Lab  Results  Component Value Date   HGBA1C 5.1 01/27/2022   MPG 99.67 01/27/2022   MPG 85.32 10/16/2020   Lab Results  Component Value Date   PROLACTIN 121.9 (H) 02/03/2017   Lab Results  Component Value Date   CHOL 236 (H) 10/16/2020   TRIG 94 10/16/2020   HDL 78 10/16/2020   CHOLHDL 3.0 10/16/2020   VLDL 19 10/16/2020   LDLCALC 139 (H) 10/16/2020   LDLCALC 160 (H) 05/26/2020    See Psychiatric Specialty Exam and Suicide Risk Assessment completed by Attending Physician prior to discharge.  Discharge destination:  Home  Is patient on multiple antipsychotic therapies at discharge:  No   Has Patient had three or more failed trials of antipsychotic monotherapy by history:  No  Recommended Plan for Multiple Antipsychotic Therapies: NA  Discharge Instructions     Diet - low sodium heart healthy   Complete by: As directed    Increase activity slowly   Complete by: As directed       Allergies as of 08/05/2022   No Known Allergies      Medication List     STOP taking these medications    Auvelity 45-105 MG Tbcr Generic drug: Dextromethorphan-buPROPion ER       TAKE these medications      Indication  baclofen 10 MG tablet Commonly known as: LIORESAL Take 1 tablet (10 mg total) by mouth 3 (three) times daily. What changed: how much to take  Indication: Muscle Spasticity   busPIRone 10 MG tablet Commonly known as: BUSPAR Take 1 tablet (10 mg total) by mouth 3 (three) times daily.  Indication: Major Depressive Disorder   clonazePAM 0.5 MG tablet Commonly known as: KLONOPIN Take 1 tablet (0.5 mg total) by mouth 2 (two) times daily as needed.  Indication: Feeling Anxious   DULoxetine 60 MG capsule Commonly known as: CYMBALTA Take 2 capsules (120 mg total) by mouth daily. Start taking on: August 06, 2022  Indication: Major Depressive Disorder   famotidine 20 MG tablet Commonly known as: PEPCID Take 1 tablet (20 mg total) by mouth daily.  Indication:  Gastroesophageal Reflux Disease   QUEtiapine 100 MG tablet Commonly known as: SEROQUEL Take 1 tablet (100 mg total) by mouth at bedtime.  Indication: Major Depressive Disorder   SUMAtriptan 100 MG tablet Commonly known as: IMITREX Take 1 tablet (100 mg total) by mouth every 2 (two) hours as needed for migraine. May repeat in 2 hours if headache persists or recurs. What changed: additional instructions  Indication: Migraine Headache  Follow-up recommendations: Continue follow-up with local providers in [place].  Contact us for return of ECT treatment if needed.  Comments: Prescriptions provided  Signed: Alethia Berthold, MD 08/05/2022, 11:01 AM

## 2022-08-05 NOTE — Care Management Important Message (Signed)
Important Message  Patient Details  Name: Natasha [name] MRN: [id number] Date of Birth: July 12, 1971   Medicare Important Message Given:  Yes  Pt given IM but denied interest in appealing discharge.    [name], LCSW 08/05/2022, 3:58 PM

## 2022-08-05 NOTE — Plan of Care (Signed)
  Problem: Communication Goal: STG - Patient will demonstrate improved communication skills by spontaneously contributing to 2 group discussions within 5 recreation therapy group sessions Description: STG - Patient will demonstrate improved communication skills by spontaneously contributing to 2 group discussions within 5 recreation therapy group sessions 08/05/2022 1213 by Ernest Haber, LRT Outcome: Not Applicable [id number] by Ernest Haber, LRT Outcome: Not Met (add Reason) Note: Patient did not attend any groups.

## 2022-08-05 NOTE — Plan of Care (Signed)
  Problem: Education: Goal: Knowledge of General Education information will improve Description: Including pain rating scale, medication(s)/side effects and non-pharmacologic comfort measures Outcome: Progressing   Problem: Health Behavior/Discharge Planning: Goal: Ability to manage health-related needs will improve Outcome: Progressing   Problem: Clinical Measurements: Goal: Ability to maintain clinical measurements within normal limits will improve Outcome: Progressing Goal: Will remain free from infection Outcome: Progressing Goal: Diagnostic test results will improve Outcome: Progressing Goal: Respiratory complications will improve Outcome: Progressing Goal: Cardiovascular complication will be avoided Outcome: Progressing   Problem: Safety: Goal: Periods of time without injury will increase Outcome: Progressing   Problem: Physical Regulation: Goal: Ability to maintain clinical measurements within normal limits will improve Outcome: Progressing   Problem: Health Behavior/Discharge Planning: Goal: Identification of resources available to assist in meeting health care needs will improve Outcome: Progressing Goal: Compliance with treatment plan for underlying cause of condition will improve Outcome: Progressing   Problem: Coping: Goal: Ability to verbalize frustrations and anger appropriately will improve Outcome: Progressing Goal: Ability to demonstrate self-control will improve Outcome: Progressing   Problem: Activity: Goal: Interest or engagement in activities will improve Outcome: Progressing Goal: Sleeping patterns will improve Outcome: Progressing   

## 2022-08-05 NOTE — [facility] Group Notes (Signed)
[facility] Group Notes:  (Nursing/MHT/Case Management/Adjunct)  Date:  08/05/2022  Time:  10:29 AM  Type of Therapy:   community meeting  Participation Level:  Did Not Attend    Rodena Goldmann 08/05/2022, 10:29 AM

## 2022-08-05 NOTE — Progress Notes (Signed)
Recreation Therapy Notes  INPATIENT RECREATION TR PLAN  Patient Details Name: Natasha [name] MRN: [id number] DOB: [birth year] Today's Date: 08/05/2022  Rec Therapy Plan Is patient appropriate for Therapeutic Recreation?: Yes Treatment times per week: at least 3 Estimated Length of Stay: 5-7 days TR Treatment/Interventions: Group participation (Comment)  Discharge Criteria Pt will be discharged from therapy if:: Discharged Treatment plan/goals/alternatives discussed and agreed upon by:: Patient/family  Discharge Summary Short term goals set: Patient will demonstrate improved communication skills by spontaneously contributing to 2 group discussions within 5 recreation therapy group sessions Short term goals met: Not met Progress toward goals comments:  (None) Reason goals not met: Patient did not attend any groups Therapeutic equipment acquired: N/A Reason patient discharged from therapy: Discharge from hospital Pt/family agrees with progress & goals achieved: Yes Date patient discharged from therapy: 08/05/22   Natasha [name] 08/05/2022, 12:15 PM

## 2022-08-05 NOTE — Progress Notes (Addendum)
Recreation Therapy Notes  Date: 08/05/2022  Time: 10:35 am    Location: Craft room    Behavioral response: N/A   Intervention Topic: Stress Management   Discussion/Intervention: Patient refused to attend group.   Clinical Observations/Feedback:  Patient refused to attend group.    Kaytlen Lightsey LRT/CTRS        Mads Borgmeyer 08/05/2022 11:38 AM

## 2022-08-05 NOTE — [facility] Counselor (Signed)
CSW spoke with pt regarding discharge plans. She stated that she plans to return home and has someone who will come get her. Pt plans to continue to follow up with her provider, Sharon Seller at [facility]. She denied use of tobacco products or any substance use. Pt and CSW discussed her desire for a therapist. She stated that she was on an waitlist for therapy. CSW asked if she would like for team to try to get her a therapy appointment before she leaves. She agreed. CSW explained that she would be given a listing of therapist/facilities that accept her insurance. She was informed that she could review the list and CSW will contact that places in which she is interested. She agreed. No other concerns expressed. Contact ended without incident.   Pt received resources for therapists who accept her insurance for review.   CSW met with pt to follow up regarding therapists/facilities in which she was interested in having CSW contact. She stated that she would like Human resources officer, [place], and Veverly Fells, [facility] contacted. CSW agreed. No other concerns expressed. Contact ended without incident.   CSW contacted [facility] [phone]) at 09:34. Contact was unable to established and HIPPA compliant voicemail.   CSW contacted [facility] [phone]. [phone]) at 09:44. Contact was unable to established and HIPPA compliant voicemail.   CSW contacted Veverly Fells, [facility] [phone]) at 09:46. Contact was unable to established and HIPPA compliant voicemail.   CSW contacted [facility] [phone]) at 09:48. Contact was unable to established and HIPPA compliant voicemail.   CSW was contacted by [facility] and informed that they do not accept [employer] for therapy.   CSW was contacted by Veverly Fells, [facility] and informed that she is accepting new pts but does not  accept [employer].   CSW contacted [facility] [phone]) at 14:37. Contact was unable to established and HIPPA compliant voicemail.   CSW contacted [facility] [phone]) at 15:29. CSW was informed that pt would need to be seen by psychiatrist there first before she could get connected with a therapist. CSW inquired was this the same if pt has a psychiatrist at another facility. CSW was informed that it has to be their psychiatrist in order to sign off on pt getting a therapist. CSW inquired about how to go about setting up an appointment. CSW was informed that referral could be sent or pt could call and schedule her own appointment. No other concerns expressed. Contact ended without incident.  CSW contacted [facility] [phone]) at 15:36. CSW was informed that they do take [employer] for therapy, however, they are not taking any new pts as they are booked out until November. No other concerns expressed. Contact ended without incident.   CSW updated pt about contacts. She stated that she would like there information and voiced plans to follow up on her own.   Chalmers Guest. Guerry Bruin, MSW, [place], [place] 08/05/2022 3:48 PM

## 2022-08-05 NOTE — Progress Notes (Signed)
D: Pt alert and oriented. Pt denies experiencing any pain, SI/HI, or AVH at this time. Pt reports she will be able to keep herself safe when she returns home.   A: Pt received discharge and medication education/information. Pt belongings were returned and signed for at this time to include specialized crutch and printed prescriptions.    R: Pt verbalized understanding of discharge and medication education/information.  Pt escorted  by staff to medical mall front lobby where pt was picked up by friend.

## 2022-08-05 NOTE — Group Note (Signed)
[facility] LCSW Group Therapy Note   Group Date: 08/05/2022 Start Time: 1300 End Time: 1400   Type of Therapy/Topic:  Group Therapy:  Balance in Life  Participation Level:  Did Not Attend   Description of Group:    This group will address the concept of balance and how it feels and looks when one is unbalanced. Patients will be encouraged to process areas in their lives that are out of balance, and identify reasons for remaining unbalanced. Facilitators will guide patients utilizing problem- solving interventions to address and correct the stressor making their life unbalanced. Understanding and applying boundaries will be explored and addressed for obtaining  and maintaining a balanced life. Patients will be encouraged to explore ways to assertively make their unbalanced needs known to significant others in their lives, using other group members and facilitator for support and feedback.  Therapeutic Goals: Patient will identify two or more emotions or situations they have that consume much of in their lives. Patient will identify signs/triggers that life has become out of balance:  Patient will identify two ways to set boundaries in order to achieve balance in their lives:  Patient will demonstrate ability to communicate their needs through discussion and/or role plays  Summary of Patient Progress: X   Therapeutic Modalities:   Cognitive Behavioral Therapy Solution-Focused Therapy Assertiveness Training   Anjelina Dung R Jeancarlo Leffler, LCSW 

## 2022-08-05 NOTE — Progress Notes (Signed)
Patient pleasant and cooperative.  Easy to engage in conversation.  Had ECT today and reports that ECT is working for her.  She does report getting better. Denies si/hi/avh. Endorse mild anxiety and depression. Q15 minute safety checks in place. Support and encouragement provided. Will continue to monitor.      C Butler-Nicholson, LPN

## 2022-08-05 NOTE — Progress Notes (Signed)
  [facility] Adult Case Management Discharge Plan :  Will you be returning to the same living situation after discharge:  Yes,  pt to return home. At discharge, do you have transportation home?: Yes,  pt support system to transport. Do you have the ability to pay for your medications: Yes,  [employer].  Release of information consent forms completed and in the chart;  Patient's signature needed at discharge.  Patient to Follow up at:  Follow-up Information     Center, [facility]. Schedule an appointment as soon as possible for a visit.   Specialty: Behavioral Health Why: Contact to schedule follow up appointment. Thanks! Contact information: [address] [place] [address] [phone]         [facility] Schedule an appointment as soon as possible for a visit.   Why: Contact to schedule follow up appointment. Thanks! Contact information: [address] [place] [address] [phone]                 Next level of care provider has access to [facility] Link:no  Safety Planning and Suicide Prevention discussed: Yes,  SPE completed with pt.     Has patient been referred to the Quitline?: Patient refused referral  Patient has been referred for addiction treatment: N/A  [name], LCSW 08/05/2022, 3:59 PM

## 2022-08-10 MED ORDER — SODIUM CHLORIDE FLUSH 0.9 % IV SOLN
INTRAVENOUS | Status: AC
  Filled 2022-08-10: qty 10

## 2022-08-12 MED ORDER — SODIUM CHLORIDE FLUSH 0.9 % IV SOLN
INTRAVENOUS | Status: AC
  Filled 2022-08-12: qty 10

## 2022-08-13 MED ORDER — PENTAFLUOROPROP-TETRAFLUOROETH EX AERO
INHALATION_SPRAY | CUTANEOUS | Status: AC
  Filled 2022-08-13: qty 30

## 2022-08-18 MED ORDER — SODIUM CHLORIDE FLUSH 0.9 % IV SOLN
INTRAVENOUS | Status: AC
  Filled 2022-08-18: qty 10

## 2022-09-15 MED ORDER — SUCCINYLCHOLINE CHLORIDE 200 MG/10ML IV SOSY
PREFILLED_SYRINGE | INTRAVENOUS | Status: AC
  Filled 2022-09-15: qty 10

## 2022-09-15 MED ORDER — ONDANSETRON HCL 4 MG/2ML IJ SOLN
INTRAMUSCULAR | Status: AC
  Filled 2022-09-15: qty 2

## 2022-09-17 MED ORDER — EPHEDRINE 5 MG/ML INJ
INTRAVENOUS | Status: AC
  Filled 2022-09-17: qty 5

## 2022-09-17 MED ORDER — PHENYLEPHRINE 80 MCG/ML (10ML) SYRINGE FOR IV PUSH (FOR BLOOD PRESSURE SUPPORT)
PREFILLED_SYRINGE | INTRAVENOUS | Status: AC
  Filled 2022-09-17: qty 10

## 2022-09-18 MED ORDER — METOCLOPRAMIDE HCL 5 MG/ML IJ SOLN
INTRAMUSCULAR | Status: AC
  Filled 2022-09-18: qty 2

## 2022-09-22 MED ORDER — LIDOCAINE HCL (PF) 2 % IJ SOLN
INTRAMUSCULAR | Status: AC
  Filled 2022-09-22: qty 20

## 2022-11-08 MED ORDER — PROPOFOL 1000 MG/100ML IV EMUL
INTRAVENOUS | Status: AC
  Filled 2022-11-08: qty 300

## 2022-11-08 MED ORDER — LIDOCAINE HCL (PF) 2 % IJ SOLN
INTRAMUSCULAR | Status: AC
  Filled 2022-11-08: qty 10

## 2022-11-09 MED ORDER — PROPOFOL 10 MG/ML IV BOLUS
INTRAVENOUS | Status: AC
  Filled 2022-11-09: qty 20

## 2022-11-16 MED ORDER — PROPOFOL 10 MG/ML IV BOLUS
INTRAVENOUS | Status: AC
  Filled 2022-11-16: qty 20

## 2022-11-16 MED ORDER — GLYCOPYRROLATE 0.2 MG/ML IJ SOLN
INTRAMUSCULAR | Status: AC
  Filled 2022-11-16: qty 1

## 2022-11-17 MED ORDER — PROPOFOL 1000 MG/100ML IV EMUL
INTRAVENOUS | Status: AC
  Filled 2022-11-17: qty 100

## 2022-11-17 MED ORDER — LIDOCAINE HCL (PF) 2 % IJ SOLN
INTRAMUSCULAR | Status: AC
  Filled 2022-11-17: qty 5

## 2022-11-24 MED ORDER — PROPOFOL 1000 MG/100ML IV EMUL
INTRAVENOUS | Status: AC
  Filled 2022-11-24: qty 100

## 2022-11-25 MED ORDER — PROPOFOL 1000 MG/100ML IV EMUL
INTRAVENOUS | Status: AC
  Filled 2022-11-25: qty 200

## 2022-11-28 MED ORDER — BUPIVACAINE HCL (PF) 0.25 % IJ SOLN
INTRAMUSCULAR | Status: AC
  Filled 2022-11-28: qty 30

## 2022-11-30 MED ORDER — LIDOCAINE HCL (PF) 2 % IJ SOLN
INTRAMUSCULAR | Status: AC
  Filled 2022-11-30: qty 10

## 2022-11-30 MED ORDER — EPHEDRINE 5 MG/ML INJ
INTRAVENOUS | Status: AC
  Filled 2022-11-30: qty 5

## 2022-11-30 MED ORDER — PROPOFOL 1000 MG/100ML IV EMUL
INTRAVENOUS | Status: AC
  Filled 2022-11-30: qty 200

## 2022-11-30 MED ORDER — PROPOFOL 1000 MG/100ML IV EMUL
INTRAVENOUS | Status: AC
  Filled 2022-11-30: qty 100

## 2022-12-01 MED ORDER — ONDANSETRON HCL 4 MG/2ML IJ SOLN
INTRAMUSCULAR | Status: AC
  Filled 2022-12-01: qty 2

## 2022-12-01 MED ORDER — PHENYLEPHRINE 80 MCG/ML (10ML) SYRINGE FOR IV PUSH (FOR BLOOD PRESSURE SUPPORT)
PREFILLED_SYRINGE | INTRAVENOUS | Status: AC
  Filled 2022-12-01: qty 10

## 2022-12-10 MED ORDER — PHENYLEPHRINE 80 MCG/ML (10ML) SYRINGE FOR IV PUSH (FOR BLOOD PRESSURE SUPPORT)
PREFILLED_SYRINGE | INTRAVENOUS | Status: AC
  Filled 2022-12-10: qty 20

## 2022-12-27 MED ORDER — LIDOCAINE HCL (PF) 2 % IJ SOLN
INTRAMUSCULAR | Status: AC
  Filled 2022-12-27: qty 30

## 2022-12-27 MED ORDER — LIDOCAINE HCL (PF) 2 % IJ SOLN
INTRAMUSCULAR | Status: AC
  Filled 2022-12-27: qty 20

## 2022-12-30 MED ORDER — SODIUM CHLORIDE FLUSH 0.9 % IV SOLN
INTRAVENOUS | Status: AC
  Filled 2022-12-30: qty 10

## 2022-12-31 MED ORDER — SODIUM CHLORIDE (PF) 0.9 % IJ SOLN
INTRAMUSCULAR | Status: AC
  Filled 2022-12-31: qty 10

## 2022-12-31 MED ORDER — PENTAFLUOROPROP-TETRAFLUOROETH EX AERO
INHALATION_SPRAY | CUTANEOUS | Status: AC
  Filled 2022-12-31: qty 30

## 2022-12-31 MED ORDER — PROPOFOL 1000 MG/100ML IV EMUL
INTRAVENOUS | Status: AC
  Filled 2022-12-31: qty 100

## 2023-01-18 MED ORDER — PROPOFOL 1000 MG/100ML IV EMUL
INTRAVENOUS | Status: AC
  Filled 2023-01-18: qty 100

## 2023-01-18 MED ORDER — PROPOFOL 1000 MG/100ML IV EMUL
INTRAVENOUS | Status: AC
  Filled 2023-01-18: qty 600

## 2023-01-18 MED ORDER — MIDAZOLAM HCL 2 MG/2ML IJ SOLN
INTRAMUSCULAR | Status: AC
  Filled 2023-01-18: qty 2

## 2023-01-21 MED ORDER — LIDOCAINE HCL (PF) 2 % IJ SOLN
INTRAMUSCULAR | Status: AC
  Filled 2023-01-21: qty 15

## 2023-01-21 MED ORDER — SEVOFLURANE IN SOLN
RESPIRATORY_TRACT | Status: AC
  Filled 2023-01-21: qty 250

## 2023-01-21 MED ORDER — PROPOFOL 1000 MG/100ML IV EMUL
INTRAVENOUS | Status: AC
  Filled 2023-01-21: qty 300

## 2023-01-21 MED ORDER — PHENYLEPHRINE 80 MCG/ML (10ML) SYRINGE FOR IV PUSH (FOR BLOOD PRESSURE SUPPORT)
PREFILLED_SYRINGE | INTRAVENOUS | Status: AC
  Filled 2023-01-21: qty 10

## 2023-01-21 MED ORDER — ALBUTEROL SULFATE HFA 108 (90 BASE) MCG/ACT IN AERS
INHALATION_SPRAY | RESPIRATORY_TRACT | Status: AC
  Filled 2023-01-21: qty 6.7

## 2023-01-21 MED ORDER — PROPOFOL 1000 MG/100ML IV EMUL
INTRAVENOUS | Status: AC
  Filled 2023-01-21: qty 200

## 2023-01-21 MED ORDER — PROPOFOL 1000 MG/100ML IV EMUL
INTRAVENOUS | Status: AC
  Filled 2023-01-21: qty 100

## 2023-01-25 ENCOUNTER — Emergency Department (EMERGENCY_DEPARTMENT_HOSPITAL)
Admission: EM | Admit: 2023-01-25 | Discharge: 2023-01-26 | Disposition: A | Discharge status: Other Acute Inpatient Hospital | Payer: 59 | Source: Home / Self Care | Attending: Emergency Medicine | Admitting: Emergency Medicine

## 2023-01-25 ENCOUNTER — Telehealth (INDEPENDENT_AMBULATORY_CARE_PROVIDER_SITE_OTHER): Payer: 59 | Admitting: Psychiatry

## 2023-01-25 DIAGNOSIS — R45851 Suicidal ideations: Secondary | ICD-10-CM | POA: Insufficient documentation

## 2023-01-25 DIAGNOSIS — Z87891 Personal history of nicotine dependence: Secondary | ICD-10-CM | POA: Insufficient documentation

## 2023-01-25 DIAGNOSIS — Z20822 Contact with and (suspected) exposure to covid-19: Secondary | ICD-10-CM | POA: Insufficient documentation

## 2023-01-25 DIAGNOSIS — G8250 Quadriplegia, unspecified: Secondary | ICD-10-CM | POA: Insufficient documentation

## 2023-01-25 DIAGNOSIS — F332 Major depressive disorder, recurrent severe without psychotic features: Secondary | ICD-10-CM

## 2023-01-25 DIAGNOSIS — M62838 Other muscle spasm: Secondary | ICD-10-CM | POA: Diagnosis present

## 2023-01-25 DIAGNOSIS — T424X2A Poisoning by benzodiazepines, intentional self-harm, initial encounter: Secondary | ICD-10-CM | POA: Insufficient documentation

## 2023-01-25 DIAGNOSIS — X838XXA Intentional self-harm by other specified means, initial encounter: Secondary | ICD-10-CM | POA: Insufficient documentation

## 2023-01-25 DIAGNOSIS — S14109A Unspecified injury at unspecified level of cervical spinal cord, initial encounter: Secondary | ICD-10-CM | POA: Insufficient documentation

## 2023-01-25 DIAGNOSIS — T50902A Poisoning by unspecified drugs, medicaments and biological substances, intentional self-harm, initial encounter: Secondary | ICD-10-CM | POA: Diagnosis present

## 2023-01-25 DIAGNOSIS — M6281 Muscle weakness (generalized): Secondary | ICD-10-CM | POA: Insufficient documentation

## 2023-01-25 DIAGNOSIS — F32A Depression, unspecified: Secondary | ICD-10-CM | POA: Diagnosis not present

## 2023-01-25 DIAGNOSIS — T1491XA Suicide attempt, initial encounter: Secondary | ICD-10-CM | POA: Diagnosis present

## 2023-01-25 DIAGNOSIS — T424X1A Poisoning by benzodiazepines, accidental (unintentional), initial encounter: Secondary | ICD-10-CM | POA: Diagnosis present

## 2023-01-25 DIAGNOSIS — R29898 Other symptoms and signs involving the musculoskeletal system: Secondary | ICD-10-CM | POA: Diagnosis present

## 2023-01-25 LAB — COMPREHENSIVE METABOLIC PANEL
ALT: 23 U/L (ref 0–44)
AST: 24 U/L (ref 15–41)
Albumin: 4 g/dL (ref 3.5–5.0)
Alkaline Phosphatase: 75 U/L (ref 38–126)
Anion gap: 11 (ref 5–15)
BUN: 19 mg/dL (ref 6–20)
CO2: 24 mmol/L (ref 22–32)
Calcium: 9.6 mg/dL (ref 8.9–10.3)
Chloride: 102 mmol/L (ref 98–111)
Creatinine, Ser: 0.83 mg/dL (ref 0.44–1.00)
GFR, Estimated: >60 mL/min (ref >60)
Glucose, Bld: 119 mg/dL — ABNORMAL HIGH (ref 70–99)
Potassium: 4.2 mmol/L (ref 3.5–5.1)
Sodium: 137 mmol/L (ref 135–145)
Total Bilirubin: 0.7 mg/dL (ref 0.3–1.2)
Total Protein: 7.4 g/dL (ref 6.5–8.1)

## 2023-01-25 LAB — RESP PANEL BY RT-PCR (RSV, FLU A&B, COVID)  RVPGX2
Influenza A by PCR: NEGATIVE
Influenza B by PCR: NEGATIVE
Resp Syncytial Virus by PCR: NEGATIVE
SARS Coronavirus 2 by RT PCR: NEGATIVE

## 2023-01-25 LAB — CBC
HCT: 42.4 % (ref 36.0–46.0)
Hemoglobin: 13.4 g/dL (ref 12.0–15.0)
MCH: 25.8 pg — ABNORMAL LOW (ref 26.0–34.0)
MCHC: 31.6 g/dL (ref 30.0–36.0)
MCV: 81.7 fL (ref 80.0–100.0)
Platelets: 333 10*3/uL (ref 150–400)
RBC: 5.19 MIL/uL — ABNORMAL HIGH (ref 3.87–5.11)
RDW: 15.8 % — ABNORMAL HIGH (ref 11.5–15.5)
WBC: 8.6 10*3/uL (ref 4.0–10.5)
nRBC: 0 % (ref 0.0–0.2)

## 2023-01-25 LAB — ETHANOL: Alcohol, Ethyl (B): <10 mg/dL (ref <10)

## 2023-01-25 LAB — SALICYLATE LEVEL: Salicylate Lvl: <7 mg/dL — ABNORMAL LOW (ref 7.0–30.0)

## 2023-01-25 LAB — ACETAMINOPHEN LEVEL: Acetaminophen (Tylenol), Serum: <10 ug/mL — ABNORMAL LOW (ref 10–30)

## 2023-01-25 MED ORDER — DULOXETINE HCL 60 MG PO CPEP
120.0000 mg | ORAL_CAPSULE | Freq: Every day | ORAL | Status: DC
  Administered 2023-01-25: 120 mg via ORAL
  Filled 2023-01-25: qty 2

## 2023-01-25 MED ORDER — BUSPIRONE HCL 5 MG PO TABS
10.0000 mg | ORAL_TABLET | Freq: Three times a day (TID) | ORAL | Status: DC
  Administered 2023-01-25: 10 mg via ORAL
  Filled 2023-01-25: qty 2

## 2023-01-25 MED ORDER — FAMOTIDINE 20 MG PO TABS
20.0000 mg | ORAL_TABLET | Freq: Every day | ORAL | Status: DC
  Administered 2023-01-25: 20 mg via ORAL
  Filled 2023-01-25: qty 1

## 2023-01-25 MED ORDER — BACLOFEN 10 MG PO TABS
10.0000 mg | ORAL_TABLET | Freq: Three times a day (TID) | ORAL | Status: DC
  Administered 2023-01-25: 10 mg via ORAL
  Filled 2023-01-25: qty 1

## 2023-01-25 MED ORDER — CLONAZEPAM 0.5 MG PO TABS: 0.5000 mg | ORAL_TABLET | Freq: Two times a day (BID) | ORAL | Status: DC | PRN

## 2023-01-25 MED ORDER — QUETIAPINE FUMARATE 25 MG PO TABS
100.0000 mg | ORAL_TABLET | Freq: Every day | ORAL | Status: DC
  Administered 2023-01-25: 100 mg via ORAL
  Filled 2023-01-25: qty 4

## 2023-01-25 NOTE — ED Triage Notes (Signed)
Pt sts that she goes to Dr. Weber Cooks and has ECT done. Pt sts that she has been very depressed and he advised her to come to the ED and he would see her.

## 2023-01-25 NOTE — Consult Note (Signed)
[place] Psychiatry Consult   Reason for Consult: Psychiatric Evaluation  Referring Physician:  Patient Identification: Natasha [name] MRN:  [id number] Principal Diagnosis: <principal problem not specified> Diagnosis:  Active Problems:   MDD (major depressive disorder), recurrent severe, without psychosis ([place])   Muscle spasticity   Spinal cord injury, cervical region ([place])   Severe recurrent major depression without psychotic features ([place])   Leg weakness, bilateral   Suicidal ideation   Benzodiazepine overdose   Suicidal behavior with attempted self-injury ([place])   Suicidal overdose ([place])   Passive suicidal ideations   Quadriplegia ([place])   Total Time spent with patient: 1 hour  Subjective: "I am very depress and I need my ECT to be done." Natasha [name] is a 52 y.o. female patient presented to [facility] ED via POV voluntary. Who came in today after speaking with Dr. Weber Cooks, who suggested she come in to get her ECT. The patient shared that she was very depressed during her assessment. She states that she is not suicidal, but she has been dealing with declining functionality in her day-to-day activities. She shared that she is very depressed and needs her ECT for her to begin to feel better. This provider saw the patient face-to-face, reviewed the chart, and consulted Dr. Ellender Hose on 01/25/2023 due to the patient's care. The EDP discussed with the patient that the patient meets the criteria for admission to the psychiatric inpatient unit.  On evaluation, the patient is alert and oriented x 4, calm, depressed but cooperative, and mood-congruent with affect. The patient does not appear to be responding to internal or external stimuli. Neither is the patient presenting with any delusional thinking. The patient denies auditory or visual hallucinations. The patient denies any suicidal, homicidal, or self-harm ideations. The patient is not presenting with any psychotic or  paranoid behaviors. During an encounter with the patient, she could answer questions appropriately.  HPI: Per Dr Ellender Hose- Natasha [name] is a 52 y.o. female  with h/o spinal cord injury, severe depression with intermittent ect here with depression. Pt reports she has been having significant worsening of her depression lately. This comes in waves intermittently and repsonds to ECT. She called Dr. Weber Cooks and she was advised to come in for treatment/admission. Denies any other medical complaints.    Past Psychiatric History:  Anxiety Depression OCD (obsessive compulsive disorder) PTSD (post-traumatic stress disorder)   Risk to Self:   Risk to Others:   Prior Inpatient Therapy:   Prior Outpatient Therapy:    Past Medical History:  Past Medical History:  Diagnosis Date   Anxiety    C6 spinal cord injury ([place])    Depression    OCD (obsessive compulsive disorder)    PTSD (post-traumatic stress disorder)     Past Surgical History:  Procedure Laterality Date   BACK SURGERY     NECK SURGERY     Family History:  Family History  Problem Relation Age of Onset   Alcohol abuse Father    Alcohol abuse Brother    Depression Mother    Alcohol abuse Mother    Parkinson's disease Mother    Family Psychiatric  History: Mother-Alcohol abuse, depression and Parkinson disease. Brother and father-alcohol abuse Social History:  Social History   Substance and Sexual Activity  Alcohol Use Yes   Alcohol/week: 0.0 standard drinks of alcohol   Comment: Occasional use     Social History   Substance and Sexual Activity  Drug Use No    Social History  Socioeconomic History   Marital status: Single    Spouse name: Not on file   Number of children: Not on file   Years of education: Not on file   Highest education level: Not on file  Occupational History   Occupation: Disability  Tobacco Use   Smoking status: Former    Packs/day: 0.00    Types: Cigarettes   Smokeless tobacco:  Never  Vaping Use   Vaping Use: Never used  Substance and Sexual Activity   Alcohol use: Yes    Alcohol/week: 0.0 standard drinks of alcohol    Comment: Occasional use   Drug use: No   Sexual activity: Not Currently  Other Topics Concern   Not on file  Social History Narrative   Pt lives alone; receives outpatient psychiatry services through [facility].  Pt is on disability due to a spinal cord injury.    Social Determinants of Health   Financial Resource Strain: Not on file  Food Insecurity: Not on file  Transportation Needs: Not on file  Physical Activity: Not on file  Stress: Not on file  Social Connections: Not on file   Additional Social History:    Allergies:  No Known Allergies  Labs:  Results for orders placed or performed during the hospital encounter of 01/25/23 (from the past 48 hour(s))  Comprehensive metabolic panel     Status: Abnormal   Collection Time: 01/25/23  3:16 PM  Result Value Ref Range   Sodium 137 135 - 145 mmol/L   Potassium 4.2 3.5 - 5.1 mmol/L   Chloride 102 98 - 111 mmol/L   CO2 24 22 - 32 mmol/L   Glucose, Bld 119 (H) 70 - 99 mg/dL    Comment: Glucose reference range applies only to samples taken after fasting for at least 8 hours.   BUN 19 6 - 20 mg/dL   Creatinine, Ser 0.83 0.44 - 1.00 mg/dL   Calcium 9.6 8.9 - 10.3 mg/dL   Total Protein 7.4 6.5 - 8.1 g/dL   Albumin 4.0 3.5 - 5.0 g/dL   AST 24 15 - 41 U/L   ALT 23 0 - 44 U/L   Alkaline Phosphatase 75 38 - 126 U/L   Total Bilirubin 0.7 0.3 - 1.2 mg/dL   GFR, Estimated >60 >60 mL/min    Comment: (NOTE) Calculated using the CKD-EPI Creatinine Equation (2021)    Anion gap 11 5 - 15    Comment: Performed at [facility], [address] [place], [place] [address]  Ethanol     Status: None   Collection Time: 01/25/23  3:16 PM  Result Value Ref Range   Alcohol, Ethyl (B) <10 <10 mg/dL    Comment: (NOTE) Lowest detectable limit for serum alcohol is 10 mg/dL.  For  medical purposes only. Performed at [facility], [address] [place], [place] [id number]   Salicylate level     Status: Abnormal   Collection Time: 01/25/23  3:16 PM  Result Value Ref Range   Salicylate Lvl Q000111Q (L) 7.0 - 30.0 mg/dL    Comment: Performed at [facility], [address] [place], [place] [address]  Acetaminophen level     Status: Abnormal   Collection Time: 01/25/23  3:16 PM  Result Value Ref Range   Acetaminophen (Tylenol), Serum <10 (L) 10 - 30 ug/mL    Comment: (NOTE) Therapeutic concentrations vary significantly. A range of 10-30 ug/mL  may be an effective concentration for many patients. However, some  are best treated at concentrations outside of this range. Acetaminophen concentrations >150 ug/mL at 4 hours after ingestion  and >50 ug/mL at 12 hours after ingestion are often associated with  toxic reactions.  Performed at [facility], [address] [place], [place] [address]   cbc     Status: Abnormal   Collection Time: 01/25/23  3:16 PM  Result Value Ref Range   WBC 8.6 4.0 - 10.5 K/uL   RBC 5.19 (H) 3.87 - 5.11 MIL/uL   Hemoglobin 13.4 12.0 - 15.0 g/dL   HCT 42.4 36.0 - 46.0 %   MCV 81.7 80.0 - 100.0 fL   MCH 25.8 (L) 26.0 - 34.0 pg   MCHC 31.6 30.0 - 36.0 g/dL   RDW 15.8 (H) 11.5 - 15.5 %   Platelets 333 150 - 400 K/uL   nRBC 0.0 0.0 - 0.2 %    Comment: Performed at [facility], [address], [place], [place] [address]    Current Facility-Administered Medications  Medication Dose Route Frequency Provider Last Rate Last Admin   baclofen (LIORESAL) tablet 10 mg  10 mg Oral TID Duffy Bruce, MD   10 mg at 01/25/23 2110   busPIRone (BUSPAR) tablet 10 mg  10 mg Oral TID Duffy Bruce, MD   10 mg at 01/25/23 2109   clonazePAM (KLONOPIN) tablet 0.5 mg  0.5 mg Oral BID PRN Duffy Bruce, MD       DULoxetine (CYMBALTA) DR capsule 120 mg  120 mg Oral Daily Duffy Bruce, MD   120 mg at 01/25/23  2017   famotidine (PEPCID) tablet 20 mg  20 mg Oral Daily Duffy Bruce, MD   20 mg at 01/25/23 2017   QUEtiapine (SEROQUEL) tablet 100 mg  100 mg Oral Gloriajean Dell, MD   100 mg at 01/25/23 2109   Current Outpatient Medications  Medication Sig Dispense Refill   AUVELITY 45-105 MG TBCR Take 1 tablet by mouth in the morning and at bedtime.     baclofen (LIORESAL) 10 MG tablet Take 1 tablet (10 mg total) by mouth 3 (three) times daily. 90 each 0   busPIRone (BUSPAR) 10 MG tablet Take 1 tablet (10 mg total) by mouth 3 (three) times daily. 90 tablet 0   clonazePAM (KLONOPIN) 0.5 MG tablet Take 1 tablet (0.5 mg total) by mouth 2 (two) times daily as needed. 30 tablet 0   DULoxetine (CYMBALTA) 60 MG capsule Take 2 capsules (120 mg total) by mouth daily. 60 capsule 0   famotidine (PEPCID) 20 MG tablet Take 1 tablet (20 mg total) by mouth daily. 30 tablet 0   meclizine (ANTIVERT) 25 MG tablet Take 25 mg by mouth 3 (three) times daily as needed.     QUEtiapine (SEROQUEL) 100 MG tablet Take 1 tablet (100 mg total) by mouth at bedtime. 30 tablet 0   SUMAtriptan (IMITREX) 100 MG tablet Take 1 tablet (100 mg total) by mouth every 2 (two) hours as needed for migraine. May repeat in 2 hours if headache persists or recurs. 10 tablet 0   gabapentin (NEURONTIN) 300 MG capsule Take 300 mg by mouth 3 (three) times daily. (Patient not taking: Reported on 01/25/2023)     traMADol (ULTRAM) 50 MG tablet Take 50 mg by mouth daily as needed. (Patient not taking: Reported on 01/25/2023)      Musculoskeletal: Strength & Muscle Tone: spastic and decreased Gait & Station: unsteady Patient leans: Backward  Psychiatric Specialty Exam:  Presentation  General  Appearance:  Appropriate for Environment  Eye Contact: Good  Speech: Clear and Coherent  Speech Volume: Normal  Handedness: Right   Mood and Affect  Mood: Depressed; Hopeless  Affect: Congruent   Thought Process  Thought  Processes: Coherent  Descriptions of Associations:Intact  Orientation:Full (Time, Place and Person)  Thought Content:WDL  History of Schizophrenia/Schizoaffective disorder:No  Duration of Psychotic Symptoms:No data recorded Hallucinations:Hallucinations: None  Ideas of Reference:None  Suicidal Thoughts:Suicidal Thoughts: No  Homicidal Thoughts:Homicidal Thoughts: No   Sensorium  Memory: Immediate Good; Recent Good; Remote Good  Judgment: Good  Insight: Good   Executive Functions  Concentration: Good  Attention Span: Good  Recall: Good  Fund of Knowledge: Good  Language: Good   Psychomotor Activity  Psychomotor Activity: Psychomotor Activity: Normal   Assets  Assets: Communication Skills; Desire for Improvement   Sleep  Sleep: Sleep: Fair Number of Hours of Sleep: 6   Physical Exam: Physical Exam Vitals and nursing note reviewed.  Constitutional:      Appearance: Normal appearance. She is normal weight.  HENT:     Head: Normocephalic and atraumatic.     Right Ear: External ear normal.     Left Ear: External ear normal.     Nose: Nose normal.     Mouth/Throat:     Mouth: Mucous membranes are moist.  Cardiovascular:     Rate and Rhythm: Normal rate.     Pulses: Normal pulses.  Pulmonary:     Effort: Pulmonary effort is normal.  Musculoskeletal:        General: Normal range of motion.     Cervical back: Rigidity present.  Lymphadenopathy:     Cervical: Cervical adenopathy present.  Neurological:     Mental Status: She is alert and oriented to person, place, and time.     Sensory: Sensory deficit present.     Motor: Weakness present.     Gait: Gait abnormal.  Psychiatric:        Attention and Perception: Attention and perception normal.        Mood and Affect: Mood is depressed. Affect is blunt and flat.        Speech: Speech normal.        Behavior: Behavior normal. Behavior is cooperative.        Thought Content: Thought  content normal.        Cognition and Memory: Cognition and memory normal.        Judgment: Judgment normal.    Review of Systems  Psychiatric/Behavioral:  Positive for depression.   All other systems reviewed and are negative.  Blood pressure (!) 128/96, pulse 76, temperature 98.1 F (36.7 C), temperature source Oral, resp. rate 17, weight 79.4 kg, SpO2 94 %. Body mass index is 34.18 kg/m.  Treatment Plan Summary: Plan   -Patient does meet the criteria for psychiatric inpatient admission  Disposition: Recommend psychiatric Inpatient admission when medically cleared. Supportive therapy provided about ongoing stressors. Per Dr. Weber Cooks patient needs to be admitted to receive her ECT treatment.  Caroline Sauger, NP 01/25/2023 10:46 PM

## 2023-01-25 NOTE — ED Notes (Signed)
Pt sts that with her spinal cord injury she is not able to give a urine sample at this time. Pt sts that she is not able to urinate on command.

## 2023-01-25 NOTE — ED Notes (Signed)
KEY PLACED IN [facility] FOR PERSONAL BELONGINGS

## 2023-01-25 NOTE — ED Provider Notes (Signed)
[facility] Provider Note    Event Date/Time   First MD Initiated Contact with Patient 01/25/23 1735     (approximate)   History   Psychiatric Evaluation   HPI  Natasha [name] is a 52 y.o. female  with h/o spinal cord injury, severe depression with intermittent ect here with depression. Pt reports she has been having significant worsening of her depression lately. This comes in waves intermittently and repsonds to ECT. She called Dr. Weber Cooks and she was advised to come in for treatment/admission. Denies any other medical complaints.       Physical Exam   Triage Vital Signs: ED Triage Vitals  Enc Vitals Group     BP 01/25/23 1513 (!) 142/91     Pulse Rate 01/25/23 1513 98     Resp 01/25/23 1513 17     Temp 01/25/23 1513 97.9 F (36.6 C)     Temp Source 01/25/23 1513 Oral     SpO2 01/25/23 1513 97 %     Weight 01/25/23 1514 175 lb (79.4 kg)     Height --      Head Circumference --      Peak Flow --      Pain Score 01/25/23 1514 5     Pain Loc --      Pain Edu? --      Excl. in [place]? --     Most recent vital signs: Vitals:   01/25/23 1513  BP: (!) 142/91  Pulse: 98  Resp: 17  Temp: 97.9 F (36.6 C)  SpO2: 97%     General: Awake, no distress.  CV:  Good peripheral perfusion. RRR. Resp:  Normal effort. Lungs clear. Abd:  No distention.  Other:  Calm, cooperative, Depressed mood with Si.   ED Results / Procedures / Treatments   Labs (all labs ordered are listed, but only abnormal results are displayed) Labs Reviewed  COMPREHENSIVE METABOLIC PANEL - Abnormal; Notable for the following components:      Result Value   Glucose, Bld 119 (*)    All other components within normal limits  SALICYLATE LEVEL - Abnormal; Notable for the following components:   Salicylate Lvl [id number] (*)    All other components within normal limits  ACETAMINOPHEN LEVEL - Abnormal; Notable for the following components:   Acetaminophen (Tylenol),  Serum <10 (*)    All other components within normal limits  CBC - Abnormal; Notable for the following components:   RBC 5.19 (*)    MCH 25.8 (*)    RDW 15.8 (*)    All other components within normal limits  ETHANOL  URINE DRUG SCREEN, QUALITATIVE ([facility] ONLY)  POC URINE PREG, ED     EKG    RADIOLOGY    I also independently reviewed and agree with radiologist interpretations.   PROCEDURES:  Critical Care performed: No   MEDICATIONS ORDERED IN ED: Medications - No data to display   IMPRESSION / MDM / [place] / ED COURSE  I reviewed the triage vital signs and the nursing notes.                              Differential diagnosis includes, but is not limited to, worsening underlying depression, adjustment d/o, situational anxiety  Patient's presentation is most consistent with acute presentation with potential threat to life or bodily function.  Pleasant 52 yo F with h/o prior spinal cord  injury, severe chronic depression here with SI, worsening depression. Dr. Weber Cooks advised her to come in for ECT/admission. Labs unremarkable. No apparent medical emergency. Stable for psych dispo.  FINAL CLINICAL IMPRESSION(S) / ED DIAGNOSES   Final diagnoses:  Depression, unspecified depression type     Rx / DC Orders   ED Discharge Orders     None        Note:  This document was prepared using Dragon voice recognition software and may include unintentional dictation errors.   Duffy Bruce, MD 01/25/23 [phone]

## 2023-01-25 NOTE — ED Notes (Signed)
VOL/pending consult

## 2023-01-25 NOTE — ED Notes (Signed)
Snack: Patient refused any snacks.

## 2023-01-25 NOTE — ED Notes (Signed)
Pt reports she is severely depressed.  Last ECT treamtent approx 6 months ago.  Pt states she spoke with dr clapacs and was advised to come to ER for admission.  Pt denies etoh use or drug use.  Pt calm and cooperative.  Pt in recliner in hallway.

## 2023-01-25 NOTE — ED Notes (Signed)
Patient belongings:  A pink scrub top A pair of navy blue scrub pants A pair of brown bedroom shoes A pocketbook A black suitcase  *Patient will have a key in pyxis for more personal belongings in safe downstairs.

## 2023-01-25 NOTE — BH Assessment (Signed)
Comprehensive Clinical Assessment (CCA) Note  01/25/2023 Natasha [name] [id number]  Chief Complaint: Patient is a 52 year old female presenting to [facility] ED voluntarily. Per triage note Pt sts that she goes to Dr. Weber Cooks and has ECT done. Pt sts that she has been very depressed and he advised her to come to the ED and he would see her. During assessment patient appears alert and oriented x4, calm and cooperative, mood appears depressed. Patient reports continued depression and denies HI/AH/VH.  Per Psyc NP Ysidro Evert patient is recommended for Inpatient Chief Complaint  Patient presents with   Psychiatric Evaluation   Visit Diagnosis: Major Depressive Disorder, recurrent episode, severe    CCA Screening, Triage and Referral (STR)  Patient Reported Information How did you hear about [place]? Self  Referral name: No data recorded Referral phone number: No data recorded  Whom do you see for routine medical problems? No data recorded Practice/Facility Name: No data recorded Practice/Facility Phone Number: No data recorded Name of Contact: No data recorded Contact Number: No data recorded Contact Fax Number: No data recorded Prescriber Name: No data recorded Prescriber Address (if known): No data recorded  What Is the Reason for Your Visit/Call Today? Pt sts that she goes to Dr. Weber Cooks and has ECT done. Pt sts that she has been very depressed and he advised her to come to the ED and he would see her.  How Long Has This Been Causing You Problems? > than 6 months  What Do You Feel Would Help You the Most Today? Treatment for Depression or other mood problem   Have You Recently Been in Any Inpatient Treatment (Hospital/Detox/Crisis Center/28-Day Program)? No data recorded Name/Location of Program/Hospital:No data recorded How Long Were You There? No data recorded When Were You Discharged? No data recorded  Have You Ever Received Services From [facility] Before? No data  recorded Who Do You See at [facility]? No data recorded  Have You Recently Had Any Thoughts About Hurting Yourself? No  Are You Planning to Commit Suicide/Harm Yourself At This time? No   Have you Recently Had Thoughts About [place]? No  Explanation: No data recorded  Have You Used Any Alcohol or Drugs in the Past 24 Hours? No  How Long Ago Did You Use Drugs or Alcohol? No data recorded What Did You Use and How Much? [place] consumption; pt consumed 1 beer at 16:30 on 01/25/22.   Do You Currently Have a Therapist/Psychiatrist? Yes  Name of Therapist/Psychiatrist: Dr. Vinnie Langton   Have You Been Recently Discharged From Any Office Practice or Programs? No  Explanation of Discharge From Practice/Program: No data recorded    CCA Screening Triage Referral Assessment Type of Contact: Face-to-Face  Is this Initial or Reassessment? No data recorded Date Telepsych consult ordered in [facility]:  No data recorded Time Telepsych consult ordered in [facility]L:  No data recorded  Patient Reported Information Reviewed? No data recorded Patient Left Without Being Seen? No data recorded Reason for Not Completing Assessment: No data recorded  Collateral Involvement: None   Does Patient Have a Court Appointed Legal Guardian? No data recorded Name and Contact of Legal Guardian: No data recorded If Minor and Not Living with Parent(s), Who has Custody? n/a  Is CPS involved or ever been involved? Never  Is APS involved or ever been involved? Never   Patient Determined To Be At Risk for Harm To Self or Others Based on Review of Patient Reported Information or Presenting Complaint? No  Method: No data recorded Availability of Means: No data recorded Intent: No data recorded Notification Required: No data recorded Additional Information for Danger to Others Potential: No data recorded Additional Comments for Danger to Others Potential: No data recorded Are There Guns or Other Weapons  in Your Home? No  Types of Guns/Weapons: No data recorded Are These Weapons Safely Secured?                            No data recorded Who Could Verify You Are Able To Have These Secured: No data recorded Do You Have any Outstanding Charges, Pending Court Dates, Parole/Probation? No data recorded Contacted To Inform of Risk of Harm To Self or Others: Other: Comment   [facility] of Assessment: [facility] ED   Does Patient Present under Involuntary Commitment? No  IVC Papers Initial File Date: 01/26/22   [place] of Residence:    Patient Currently Receiving the Following Services: ECT; Medication Management   Determination of Need: Emergent (2 hours)   Options For Referral: Inpatient Hospitalization     CCA Biopsychosocial Intake/Chief Complaint:  No data recorded Current Symptoms/Problems: No data recorded  Patient Reported Schizophrenia/Schizoaffective Diagnosis in Past: No   Strengths: Patient is able to communicate her needs  Preferences: No data recorded Abilities: No data recorded  Type of Services Patient Feels are Needed: No data recorded  Initial Clinical Notes/Concerns: No data recorded  Mental Health Symptoms Depression:   Change in energy/activity; Difficulty Concentrating; Fatigue; Hopelessness   Duration of Depressive symptoms:  Greater than two weeks   Mania:   None   Anxiety:    Difficulty concentrating; Fatigue   Psychosis:   None   Duration of Psychotic symptoms: No data recorded  Trauma:   None   Obsessions:   None   Compulsions:   None   Inattention:   None   Hyperactivity/Impulsivity:   None   Oppositional/Defiant Behaviors:   None   Emotional Irregularity:   None   Other Mood/Personality Symptoms:   Current spinal cord injury contributes to her mental decline.    Mental Status Exam Appearance and self-care  Stature:   Average   Weight:   Average weight   Clothing:   Casual   Grooming:   Normal    Cosmetic use:   None   Posture/gait:   Normal   Motor activity:   Not Remarkable   Sensorium  Attention:   Normal   Concentration:   Normal   Orientation:   X5   Recall/memory:   Normal   Affect and Mood  Affect:   Depressed   Mood:   Depressed   Relating  Eye contact:   Normal   Facial expression:   Responsive   Attitude toward examiner:   Cooperative   Thought and Language  Speech flow:  Clear and Coherent   Thought content:   Appropriate to Mood and Circumstances   Preoccupation:   None   Hallucinations:   None   Organization:  No data recorded  [employer] of Knowledge:   Fair   Intelligence:   Average   Abstraction:   Normal   Judgement:   Fair   Art therapist:   Adequate   Insight:   Good   Decision Making:   Normal   Social Functioning  Social Maturity:   Responsible   Social Judgement:   Normal   Stress  Stressors:   Other (Comment)  Coping Ability:   Exhausted   Skill Deficits:   None   Supports:   Friends/Service system     Religion: Religion/Spirituality Are You A Religious Person?: No  Leisure/Recreation: Leisure / Recreation Do You Have Hobbies?: No  Exercise/Diet: Exercise/Diet Do You Exercise?: No Have You Gained or Lost A Significant Amount of Weight in the Past Six Months?: No Do You Follow a Special Diet?: No Do You Have Any Trouble Sleeping?: No   CCA Employment/Education Employment/Work Situation: Employment / Work Technical sales engineer: On disability Why is Patient on Disability: Mental Health How Long has Patient Been on Disability: Unknown Has Patient ever Been in the [employer]?: No  Education: Education Is Patient Currently Attending School?: No Did You Have An Individualized Education Program (IIEP): No Did You Have Any Difficulty At [employer]?: No Patient's Education Has Been Impacted by Current Illness: No   CCA Family/Childhood  History Family and Relationship History: Family history Marital status: Single Does patient have children?: No  Childhood History:  Childhood History By whom was/is the patient raised?: Both parents Did patient suffer any verbal/emotional/physical/sexual abuse as a child?: No Did patient suffer from severe childhood neglect?: No Has patient ever been sexually abused/assaulted/raped as an adolescent or adult?: No Was the patient ever a victim of a crime or a disaster?: No Witnessed domestic violence?: No Has patient been affected by domestic violence as an adult?: No  Child/Adolescent Assessment:     CCA Substance Use Alcohol/Drug Use: Alcohol / Drug Use Pain Medications: See MAR Prescriptions: See MAR Over the Counter: See MAR History of alcohol / drug use?: Yes Longest period of sobriety (when/how long): Unknown Negative Consequences of Use: Personal relationships Withdrawal Symptoms: None                         ASAM's:  Six Dimensions of Multidimensional Assessment  Dimension 1:  Acute Intoxication and/or Withdrawal Potential:   Dimension 1:  Description of individual's past and current experiences of substance use and withdrawal: Pt reported alchol use  Dimension 2:  Biomedical Conditions and Complications:   Dimension 2:  Description of patient's biomedical conditions and  complications: 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  Dimension 3:  Emotional, Behavioral, or Cognitive Conditions and Complications:  Dimension 3:  Description of emotional, behavioral, or cognitive conditions and complications: Pt has a hx of major depression and suicide attempts  Dimension 4:  Readiness to Change:     Dimension 5:  Relapse, Continued use, or Continued Problem Potential:     Dimension 6:  Recovery/Living Environment:     ASAM Severity Score: ASAM's Severity Rating Score: 20  ASAM Recommended Level of Treatment: ASAM Recommended Level of  Treatment: Level II Intensive Outpatient Treatment   Substance use Disorder (SUD) Substance Use Disorder (SUD)  Checklist Symptoms of Substance Use: Continued use despite having a persistent/recurrent physical/psychological problem caused/exacerbated by use  Recommendations for Services/Supports/Treatments: Recommendations for Services/Supports/Treatments Recommendations For Services/Supports/Treatments: Individual Therapy, Medication Management  DSM5 Diagnoses: Patient Active Problem List   Diagnosis Date Noted   Quadriplegia ([place]) 07/29/2022   Passive suicidal ideations 07/26/2022   Obesity (BMI 30-39.9) 06/15/2022   Suicidal behavior with attempted self-injury ([place]) 06/11/2022   Acute encephalopathy 06/11/2022   Suicidal overdose ([place]) 06/11/2022   Benzodiazepine overdose 06/10/2022   Suicidal ideation 01/26/2022   Leg weakness, bilateral 11/03/2021   Severe recurrent major depression without psychotic features ([place]) 10/16/2020   MDD (  major depressive disorder), recurrent episode, severe ([place]) 10/15/2020   Right foot drop 10/13/2020   Muscle spasticity 01/29/2020   History of DVT (deep vein thrombosis) 05/11/2019   Spinal cord injury, cervical region [facility]) 05/11/2019   MDD (major depressive disorder), recurrent severe, without psychosis ([place]) 02/02/2017    Patient Centered Plan: Patient is on the following Treatment Plan(s):  Depression   Referrals to Alternative Service(s): Referred to Alternative Service(s):   Place:   Date:   Time:    Referred to Alternative Service(s):   Place:   Date:   Time:    Referred to Alternative Service(s):   Place:   Date:   Time:    Referred to Alternative Service(s):   Place:   Date:   Time:      @BHCOLLABOFCARE$ @  [employer], LCAS-A

## 2023-01-26 ENCOUNTER — Other Ambulatory Visit: Payer: Self-pay | Admitting: Psychiatry

## 2023-01-26 ENCOUNTER — Inpatient Hospital Stay: Payer: 59

## 2023-01-26 ENCOUNTER — Inpatient Hospital Stay
Admission: RE | Admit: 2023-01-26 | Discharge: 2023-01-26 | DRG: 885 | Disposition: A | Discharge status: Other Acute Inpatient Hospital | Payer: 59 | Source: Ambulatory Visit | Attending: Psychiatry | Admitting: Psychiatry

## 2023-01-26 ENCOUNTER — Ambulatory Visit: Payer: 59

## 2023-01-26 ENCOUNTER — Inpatient Hospital Stay: Payer: 59 | Admitting: Certified Registered"

## 2023-01-26 ENCOUNTER — Inpatient Hospital Stay
Admission: AD | Admit: 2023-01-26 | Discharge: 2023-01-28 | DRG: 177 | Disposition: A | Discharge status: Home / Self Care | Payer: 59 | Source: Ambulatory Visit | Attending: Internal Medicine | Admitting: Internal Medicine

## 2023-01-26 ENCOUNTER — Other Ambulatory Visit: Payer: Self-pay

## 2023-01-26 ENCOUNTER — Encounter: Payer: Self-pay | Admitting: Internal Medicine

## 2023-01-26 ENCOUNTER — Encounter (HOSPITAL_COMMUNITY): Payer: Self-pay

## 2023-01-26 DIAGNOSIS — R053 Chronic cough: Secondary | ICD-10-CM | POA: Diagnosis present

## 2023-01-26 DIAGNOSIS — Z9151 Personal history of suicidal behavior: Secondary | ICD-10-CM

## 2023-01-26 DIAGNOSIS — F4310 Post-traumatic stress disorder, unspecified: Secondary | ICD-10-CM | POA: Diagnosis present

## 2023-01-26 DIAGNOSIS — S14109A Unspecified injury at unspecified level of cervical spinal cord, initial encounter: Secondary | ICD-10-CM | POA: Diagnosis present

## 2023-01-26 DIAGNOSIS — T424X2A Poisoning by benzodiazepines, intentional self-harm, initial encounter: Secondary | ICD-10-CM | POA: Diagnosis present

## 2023-01-26 DIAGNOSIS — Z66 Do not resuscitate: Secondary | ICD-10-CM | POA: Diagnosis not present

## 2023-01-26 DIAGNOSIS — Z820 Family history of epilepsy and other diseases of the nervous system: Secondary | ICD-10-CM | POA: Diagnosis not present

## 2023-01-26 DIAGNOSIS — Z818 Family history of other mental and behavioral disorders: Secondary | ICD-10-CM

## 2023-01-26 DIAGNOSIS — X58XXXS Exposure to other specified factors, sequela: Secondary | ICD-10-CM | POA: Diagnosis present

## 2023-01-26 DIAGNOSIS — Z6834 Body mass index (BMI) 34.0-34.9, adult: Secondary | ICD-10-CM | POA: Diagnosis not present

## 2023-01-26 DIAGNOSIS — Z811 Family history of alcohol abuse and dependence: Secondary | ICD-10-CM

## 2023-01-26 DIAGNOSIS — F332 Major depressive disorder, recurrent severe without psychotic features: Secondary | ICD-10-CM | POA: Diagnosis present

## 2023-01-26 DIAGNOSIS — E669 Obesity, unspecified: Secondary | ICD-10-CM | POA: Diagnosis not present

## 2023-01-26 DIAGNOSIS — M6281 Muscle weakness (generalized): Secondary | ICD-10-CM | POA: Diagnosis present

## 2023-01-26 DIAGNOSIS — K219 Gastro-esophageal reflux disease without esophagitis: Secondary | ICD-10-CM | POA: Diagnosis present

## 2023-01-26 DIAGNOSIS — Z87891 Personal history of nicotine dependence: Secondary | ICD-10-CM | POA: Diagnosis not present

## 2023-01-26 DIAGNOSIS — Z6839 Body mass index (BMI) 39.0-39.9, adult: Secondary | ICD-10-CM | POA: Diagnosis not present

## 2023-01-26 DIAGNOSIS — M797 Fibromyalgia: Secondary | ICD-10-CM | POA: Diagnosis present

## 2023-01-26 DIAGNOSIS — S14109S Unspecified injury at unspecified level of cervical spinal cord, sequela: Secondary | ICD-10-CM | POA: Diagnosis not present

## 2023-01-26 DIAGNOSIS — M62838 Other muscle spasm: Secondary | ICD-10-CM | POA: Diagnosis present

## 2023-01-26 DIAGNOSIS — X838XXA Intentional self-harm by other specified means, initial encounter: Secondary | ICD-10-CM | POA: Diagnosis not present

## 2023-01-26 DIAGNOSIS — R45851 Suicidal ideations: Secondary | ICD-10-CM | POA: Diagnosis present

## 2023-01-26 DIAGNOSIS — F429 Obsessive-compulsive disorder, unspecified: Secondary | ICD-10-CM | POA: Diagnosis present

## 2023-01-26 DIAGNOSIS — Z20822 Contact with and (suspected) exposure to covid-19: Secondary | ICD-10-CM | POA: Diagnosis present

## 2023-01-26 DIAGNOSIS — Z79899 Other long term (current) drug therapy: Secondary | ICD-10-CM | POA: Diagnosis not present

## 2023-01-26 DIAGNOSIS — J9601 Acute respiratory failure with hypoxia: Secondary | ICD-10-CM | POA: Diagnosis present

## 2023-01-26 DIAGNOSIS — J690 Pneumonitis due to inhalation of food and vomit: Principal | ICD-10-CM | POA: Diagnosis present

## 2023-01-26 DIAGNOSIS — G8250 Quadriplegia, unspecified: Secondary | ICD-10-CM | POA: Diagnosis present

## 2023-01-26 LAB — CBC WITH DIFFERENTIAL/PLATELET
Abs Immature Granulocytes: 0.08 10*3/uL — ABNORMAL HIGH (ref 0.00–0.07)
Basophils Absolute: 0.1 10*3/uL (ref 0.0–0.1)
Basophils Relative: 0 %
Eosinophils Absolute: 0 10*3/uL (ref 0.0–0.5)
Eosinophils Relative: 0 %
HCT: 42.2 % (ref 36.0–46.0)
Hemoglobin: 13.1 g/dL (ref 12.0–15.0)
Immature Granulocytes: 1 %
Lymphocytes Relative: 4 %
Lymphs Abs: 0.7 10*3/uL (ref 0.7–4.0)
MCH: 25.6 pg — ABNORMAL LOW (ref 26.0–34.0)
MCHC: 31 g/dL (ref 30.0–36.0)
MCV: 82.6 fL (ref 80.0–100.0)
Monocytes Absolute: 0.9 10*3/uL (ref 0.1–1.0)
Monocytes Relative: 6 %
Neutro Abs: 13.8 10*3/uL — ABNORMAL HIGH (ref 1.7–7.7)
Neutrophils Relative %: 89 %
Platelets: 340 10*3/uL (ref 150–400)
RBC: 5.11 MIL/uL (ref 3.87–5.11)
RDW: 15.7 % — ABNORMAL HIGH (ref 11.5–15.5)
WBC: 15.6 10*3/uL — ABNORMAL HIGH (ref 4.0–10.5)
nRBC: 0 % (ref 0.0–0.2)

## 2023-01-26 LAB — COMPREHENSIVE METABOLIC PANEL
ALT: 25 U/L (ref 0–44)
AST: 29 U/L (ref 15–41)
Albumin: 4 g/dL (ref 3.5–5.0)
Alkaline Phosphatase: 81 U/L (ref 38–126)
Anion gap: 9 (ref 5–15)
BUN: 22 mg/dL — ABNORMAL HIGH (ref 6–20)
CO2: 23 mmol/L (ref 22–32)
Calcium: 9 mg/dL (ref 8.9–10.3)
Chloride: 103 mmol/L (ref 98–111)
Creatinine, Ser: 0.88 mg/dL (ref 0.44–1.00)
GFR, Estimated: >60 mL/min (ref >60)
Glucose, Bld: 144 mg/dL — ABNORMAL HIGH (ref 70–99)
Potassium: 4.3 mmol/L (ref 3.5–5.1)
Sodium: 135 mmol/L (ref 135–145)
Total Bilirubin: 1 mg/dL (ref 0.3–1.2)
Total Protein: 7.2 g/dL (ref 6.5–8.1)

## 2023-01-26 LAB — GLUCOSE, CAPILLARY: Glucose-Capillary: 95 mg/dL (ref 70–99)

## 2023-01-26 MED ORDER — IPRATROPIUM-ALBUTEROL 0.5-2.5 (3) MG/3ML IN SOLN: 3.0000 mL | RESPIRATORY_TRACT | Status: DC

## 2023-01-26 MED ORDER — METHOHEXITAL SODIUM 0.5 G IJ SOLR
INTRAMUSCULAR | Status: AC
  Filled 2023-01-26: qty 500

## 2023-01-26 MED ORDER — ZIPRASIDONE MESYLATE 20 MG IM SOLR: 20.0000 mg | INTRAMUSCULAR | Status: DC | PRN

## 2023-01-26 MED ORDER — ACETAMINOPHEN 325 MG PO TABS: 650.0000 mg | ORAL_TABLET | Freq: Four times a day (QID) | ORAL | Status: DC | PRN

## 2023-01-26 MED ORDER — DULOXETINE HCL 30 MG PO CPEP
120.0000 mg | ORAL_CAPSULE | Freq: Every day | ORAL | Status: DC
  Administered 2023-01-27 – 2023-01-28 (×2): 120 mg via ORAL
  Filled 2023-01-26 (×2): qty 4

## 2023-01-26 MED ORDER — FENTANYL CITRATE (PF) 100 MCG/2ML IJ SOLN
INTRAMUSCULAR | Status: AC
  Filled 2023-01-26: qty 2

## 2023-01-26 MED ORDER — HYDROMORPHONE HCL 1 MG/ML IJ SOLN
0.5000 mg | INTRAMUSCULAR | Status: DC | PRN
  Administered 2023-01-26 – 2023-01-28 (×5): 0.5 mg via INTRAVENOUS
  Filled 2023-01-26 (×3): qty 1
  Filled 2023-01-26: qty 0.5
  Filled 2023-01-26: qty 1

## 2023-01-26 MED ORDER — FAMOTIDINE 20 MG PO TABS
20.0000 mg | ORAL_TABLET | Freq: Every day | ORAL | Status: DC
  Filled 2023-01-26: qty 1

## 2023-01-26 MED ORDER — LACTATED RINGERS IV BOLUS: 1000.0000 mL | Freq: Once | INTRAVENOUS | Status: DC

## 2023-01-26 MED ORDER — SUCCINYLCHOLINE CHLORIDE 200 MG/10ML IV SOSY
PREFILLED_SYRINGE | INTRAVENOUS | Status: DC | PRN
  Administered 2023-01-26: 80 mg via INTRAVENOUS

## 2023-01-26 MED ORDER — QUETIAPINE FUMARATE 100 MG PO TABS: 100.0000 mg | ORAL_TABLET | Freq: Every day | ORAL | Status: DC

## 2023-01-26 MED ORDER — ALUM & MAG HYDROXIDE-SIMETH 200-200-20 MG/5ML PO SUSP: 30.0000 mL | ORAL | Status: DC | PRN

## 2023-01-26 MED ORDER — CLONAZEPAM 0.5 MG PO TABS
0.5000 mg | ORAL_TABLET | Freq: Two times a day (BID) | ORAL | Status: DC | PRN
  Administered 2023-01-27: 0.5 mg via ORAL
  Filled 2023-01-26: qty 1

## 2023-01-26 MED ORDER — FENTANYL CITRATE (PF) 100 MCG/2ML IJ SOLN
25.0000 ug | INTRAMUSCULAR | Status: DC | PRN
  Administered 2023-01-26: 25 ug via INTRAVENOUS

## 2023-01-26 MED ORDER — ONDANSETRON HCL 4 MG/2ML IJ SOLN: 4.0000 mg | Freq: Once | INTRAMUSCULAR | Status: DC | PRN

## 2023-01-26 MED ORDER — MIDAZOLAM HCL 2 MG/2ML IJ SOLN
INTRAMUSCULAR | Status: AC
  Filled 2023-01-26: qty 2

## 2023-01-26 MED ORDER — IPRATROPIUM-ALBUTEROL 0.5-2.5 (3) MG/3ML IN SOLN
RESPIRATORY_TRACT | Status: AC
  Filled 2023-01-26: qty 3

## 2023-01-26 MED ORDER — METHOHEXITAL SODIUM 100 MG/10ML IV SOSY
PREFILLED_SYRINGE | INTRAVENOUS | Status: DC | PRN
  Administered 2023-01-26 (×2): 70 mg via INTRAVENOUS

## 2023-01-26 MED ORDER — ACETAMINOPHEN 325 MG PO TABS
650.0000 mg | ORAL_TABLET | Freq: Four times a day (QID) | ORAL | Status: DC | PRN
  Administered 2023-01-27: 650 mg via ORAL
  Filled 2023-01-26 (×2): qty 2

## 2023-01-26 MED ORDER — LACTATED RINGERS IV SOLN: INTRAVENOUS | Status: AC

## 2023-01-26 MED ORDER — LORAZEPAM 1 MG PO TABS: 1.0000 mg | ORAL_TABLET | ORAL | Status: DC | PRN

## 2023-01-26 MED ORDER — MAGNESIUM HYDROXIDE 400 MG/5ML PO SUSP: 30.0000 mL | Freq: Every day | ORAL | Status: DC | PRN

## 2023-01-26 MED ORDER — IPRATROPIUM-ALBUTEROL 0.5-2.5 (3) MG/3ML IN SOLN: 3.0000 mL | Freq: Once | RESPIRATORY_TRACT | Status: DC

## 2023-01-26 MED ORDER — DULOXETINE HCL 30 MG PO CPEP
120.0000 mg | ORAL_CAPSULE | Freq: Every day | ORAL | Status: DC
  Filled 2023-01-26: qty 4

## 2023-01-26 MED ORDER — LABETALOL HCL 5 MG/ML IV SOLN
INTRAVENOUS | Status: DC | PRN
  Administered 2023-01-26: 10 mg via INTRAVENOUS

## 2023-01-26 MED ORDER — BUSPIRONE HCL 5 MG PO TABS
10.0000 mg | ORAL_TABLET | Freq: Three times a day (TID) | ORAL | Status: DC
  Filled 2023-01-26: qty 2

## 2023-01-26 MED ORDER — SUCCINYLCHOLINE CHLORIDE 200 MG/10ML IV SOSY: PREFILLED_SYRINGE | INTRAVENOUS | Status: DC | PRN

## 2023-01-26 MED ORDER — SODIUM CHLORIDE 0.9 % IV SOLN: 500.0000 mL | Freq: Once | INTRAVENOUS | Status: DC

## 2023-01-26 MED ORDER — BACLOFEN 10 MG PO TABS
10.0000 mg | ORAL_TABLET | Freq: Three times a day (TID) | ORAL | Status: DC
  Administered 2023-01-26 – 2023-01-28 (×6): 10 mg via ORAL
  Filled 2023-01-26 (×6): qty 1

## 2023-01-26 MED ORDER — LABETALOL HCL 5 MG/ML IV SOLN: INTRAVENOUS | Status: DC | PRN

## 2023-01-26 MED ORDER — CLONAZEPAM 0.5 MG PO TABS: 0.5000 mg | ORAL_TABLET | Freq: Two times a day (BID) | ORAL | Status: DC | PRN

## 2023-01-26 MED ORDER — BACLOFEN 10 MG PO TABS
10.0000 mg | ORAL_TABLET | Freq: Three times a day (TID) | ORAL | Status: DC
  Filled 2023-01-26: qty 1

## 2023-01-26 MED ORDER — RISPERIDONE 1 MG PO TBDP: 2.0000 mg | ORAL_TABLET | Freq: Three times a day (TID) | ORAL | Status: DC | PRN

## 2023-01-26 MED ORDER — BUSPIRONE HCL 10 MG PO TABS
10.0000 mg | ORAL_TABLET | Freq: Three times a day (TID) | ORAL | Status: DC
  Administered 2023-01-26 – 2023-01-28 (×6): 10 mg via ORAL
  Filled 2023-01-26 (×6): qty 1

## 2023-01-26 MED ORDER — MIDAZOLAM HCL 2 MG/2ML IJ SOLN
INTRAMUSCULAR | Status: DC | PRN
  Administered 2023-01-26: 2 mg via INTRAVENOUS

## 2023-01-26 MED ORDER — ENOXAPARIN SODIUM 40 MG/0.4ML IJ SOSY
40.0000 mg | PREFILLED_SYRINGE | Freq: Every day | INTRAMUSCULAR | Status: DC
  Administered 2023-01-26 – 2023-01-27 (×2): 40 mg via SUBCUTANEOUS
  Filled 2023-01-26 (×2): qty 0.4

## 2023-01-26 MED ORDER — MIDAZOLAM HCL 2 MG/2ML IJ SOLN: 2.0000 mg | Freq: Once | INTRAMUSCULAR | Status: DC

## 2023-01-26 MED ORDER — QUETIAPINE FUMARATE 25 MG PO TABS
100.0000 mg | ORAL_TABLET | Freq: Every day | ORAL | Status: DC
  Administered 2023-01-26 – 2023-01-27 (×2): 100 mg via ORAL
  Filled 2023-01-26 (×2): qty 4

## 2023-01-26 MED ORDER — SODIUM CHLORIDE 0.9 % IV SOLN
3.0000 g | Freq: Four times a day (QID) | INTRAVENOUS | Status: DC
  Administered 2023-01-26 – 2023-01-28 (×7): 3 g via INTRAVENOUS
  Filled 2023-01-26 (×11): qty 8

## 2023-01-26 NOTE — Progress Notes (Incomplete)
D- Patient alert and oriented. Affect/mood. Denies SI, HI, AVH, and pain. Went for ECT @ 10:00am A- Scheduled medications administered to patient, per MD orders. Support and encouragement provided.  Routine safety checks conducted every 15 minutes.  Patient informed to notify staff with problems or concerns. R- No adverse drug reactions noted. Patient contracts for safety at this time. Patient compliant with medications and treatment plan. Patient receptive, calm, and cooperative. Patient interacts well with others on the unit.  Patient remains safe at this time.

## 2023-01-26 NOTE — Assessment & Plan Note (Addendum)
Patient denies any prior history of dysphagia or difficulty with choking.  No history of asthma or COPD.  Chest x-ray and CT both note Multi lobar pneumonia.  Initially on IV antibiotics.  Procalcitonin mildly elevated.  By day of discharge, discharged on p.o. Augmentin to complete 5 days of therapy

## 2023-01-26 NOTE — Group Note (Signed)
Recreation Therapy Group Note   Group Topic:Self-Esteem  Group Date: 01/26/2023 Start Time: 1000 End Time: 1035 Facilitators: Adonis Brook Location:  Dayroom  Group Description: Patients and LRT discussed the importance of self-love and self-esteem. Pt completed a worksheet that helps them identify 24 different strengths and qualities about themselves. Pt encouraged to read aloud at least 3 off their sheet to the group. Pt's then had the choice to play "Positive Affirmation Bingo" afterwards, with journals or stress balls as bingo prizes.   Affect/Mood: N/A   Participation Level: Did not attend    Clinical Observations/Individualized Feedback: Natasha [name] if not attend group due to resting in their room.   Plan: Continue to engage patient in RT group sessions 2-3x/week.   [name], LRT, CTRS 01/26/2023 10:54 AM

## 2023-01-26 NOTE — Plan of Care (Signed)
Patient new to the unit tonight, hasn't had time to progress  Problem: Education: Goal: Knowledge of General Education information will improve Description: Including pain rating scale, medication(s)/side effects and non-pharmacologic comfort measures Outcome: Not Progressing   Problem: Health Behavior/Discharge Planning: Goal: Ability to manage health-related needs will improve Outcome: Not Progressing   Problem: Clinical Measurements: Goal: Ability to maintain clinical measurements within normal limits will improve Outcome: Not Progressing Goal: Will remain free from infection Outcome: Not Progressing Goal: Diagnostic test results will improve Outcome: Not Progressing Goal: Respiratory complications will improve Outcome: Not Progressing Goal: Cardiovascular complication will be avoided Outcome: Not Progressing   Problem: Activity: Goal: Risk for activity intolerance will decrease Outcome: Not Progressing   Problem: Nutrition: Goal: Adequate nutrition will be maintained Outcome: Not Progressing   Problem: Coping: Goal: Level of anxiety will decrease Outcome: Not Progressing   Problem: Elimination: Goal: Will not experience complications related to bowel motility Outcome: Not Progressing Goal: Will not experience complications related to urinary retention Outcome: Not Progressing   Problem: Pain Managment: Goal: General experience of comfort will improve Outcome: Not Progressing   Problem: Safety: Goal: Ability to remain free from injury will improve Outcome: Not Progressing   Problem: Skin Integrity: Goal: Risk for impaired skin integrity will decrease Outcome: Not Progressing

## 2023-01-26 NOTE — Anesthesia Preprocedure Evaluation (Signed)
Anesthesia Evaluation  Patient identified by MRN, date of birth, ID band Patient awake    Reviewed: Allergy & Precautions, NPO status , Patient's Chart, lab work & pertinent test results  Airway Mallampati: III  TM Distance: >3 FB Neck ROM: full    Dental  (+) Teeth Intact   Pulmonary neg pulmonary ROS, former smoker   Pulmonary exam normal  + decreased breath sounds      Cardiovascular Exercise Tolerance: Good negative cardio ROS Normal cardiovascular exam Rhythm:Regular     Neuro/Psych   Anxiety Depression    negative neurological ROS  negative psych ROS   GI/Hepatic negative GI ROS, Neg liver ROS,,,  Endo/Other  negative endocrine ROS  Morbid obesity  Renal/GU negative Renal ROS  negative genitourinary   Musculoskeletal   Abdominal  (+) + obese  Peds negative pediatric ROS (+)  Hematology negative hematology ROS (+)   Anesthesia Other Findings Past Medical History: No date: Anxiety No date: C6 spinal cord injury ([place]) No date: Depression No date: OCD (obsessive compulsive disorder) No date: PTSD (post-traumatic stress disorder)  Past Surgical History: No date: BACK SURGERY No date: NECK SURGERY  BMI    Body Mass Index: 34.19 kg/m      Reproductive/Obstetrics negative OB ROS                             Anesthesia Physical Anesthesia Plan  ASA: 3  Anesthesia Plan: General   Post-op Pain Management:    Induction: Intravenous  PONV Risk Score and Plan: Propofol infusion and TIVA  Airway Management Planned: Natural Airway and Nasal Cannula  Additional Equipment:   Intra-op Plan:   Post-operative Plan:   Informed Consent: I have reviewed the patients History and Physical, chart, labs and discussed the procedure including the risks, benefits and alternatives for the proposed anesthesia with the patient or authorized representative who has indicated his/her  understanding and acceptance.     Dental Advisory Given  Plan Discussed with: CRNA and Surgeon  Anesthesia Plan Comments:        Anesthesia Quick Evaluation

## 2023-01-26 NOTE — Anesthesia Postprocedure Evaluation (Signed)
Anesthesia Post Note  Patient: Natasha [name]  Procedure(s) Performed: ECT TX  Patient location during evaluation: PACU Anesthesia Type: General Level of consciousness: awake Pain management: pain level controlled Vital Signs Assessment: post-procedure vital signs reviewed and stable Respiratory status: nonlabored ventilation Cardiovascular status: stable Anesthetic complications: no   No notable events documented.   Last Vitals:  Vitals:   01/26/23 1315 01/26/23 1330  BP: 118/85 115/80  Pulse: 88 82  Resp: (!) 23 18  Temp:    SpO2: 90% 93%    Last Pain:  Vitals:   01/26/23 1330  TempSrc:   PainSc: 0-No pain                 VAN STAVEREN,Lama Narayanan

## 2023-01-26 NOTE — Progress Notes (Signed)
Patient from ECT treatment, O@ sats low throughout stay. Dr Weber Cooks aware and once CXR done, patient has pneumonia according to him and needs to be admitted. Patient stable but sats low, ABle to use IS and get to 200=250 with it. Patient to be discharged from Behavioral health and readmitted to 2A, Patient transferred to 236.

## 2023-01-26 NOTE — Progress Notes (Incomplete)
10:00 Patient off the floor to ECT. At 2:45pm call from Radiology with CXR report Pneumonia, right lower lung, consideration aspiration. She remains in PACU at this time secondary to low O2 sat's.

## 2023-01-26 NOTE — Plan of Care (Signed)

## 2023-01-26 NOTE — BHH Group Notes (Signed)
[place] Group Notes:  (Nursing/MHT/Case Management/Adjunct)  Date:  01/26/2023  Time:  9:55 AM  Type of Therapy:   Community Group  Participation Level:  Did Not Attend  Participation Quality:    Affect:    Cognitive:    Insight:    Engagement in Group:    Modes of Intervention:    Summary of Progress/Problems:  Natasha [name] 01/26/2023, 9:55 AM

## 2023-01-26 NOTE — BHH Suicide Risk Assessment (Signed)
[facility] Discharge Suicide Risk Assessment   Principal Problem: MDD (major depressive disorder), recurrent severe, without psychosis ([place]) Discharge Diagnoses: Principal Problem:   MDD (major depressive disorder), recurrent severe, without psychosis ([place]) Active Problems:   Quadriplegia ([place])   Total Time spent with patient: 30 minutes  Musculoskeletal: Strength & Muscle Tone: decreased and atrophy Gait & Station: unable to stand Patient leans: N/A  Psychiatric Specialty Exam  Presentation  General Appearance:  Appropriate for Environment  Eye Contact: Good  Speech: Clear and Coherent  Speech Volume: Normal  Handedness: Right   Mood and Affect  Mood: Depressed; Hopeless  Duration of Depression Symptoms: Greater than two weeks  Affect: Congruent   Thought Process  Thought Processes: Coherent  Descriptions of Associations:Intact  Orientation:Full (Time, Place and Person)  Thought Content:WDL  History of Schizophrenia/Schizoaffective disorder:No  Duration of Psychotic Symptoms:No data recorded Hallucinations:Hallucinations: None  Ideas of Reference:None  Suicidal Thoughts:Suicidal Thoughts: No  Homicidal Thoughts:Homicidal Thoughts: No   Sensorium  Memory: Immediate Good; Recent Good; Remote Good  Judgment: Good  Insight: Good   Executive Functions  Concentration: Good  Attention Span: Good  Recall: Good  Fund of Knowledge: Good  Language: Good   Psychomotor Activity  Psychomotor Activity: Psychomotor Activity: Normal   Assets  Assets: Communication Skills; Desire for Improvement   Sleep  Sleep: Sleep: Fair Number of Hours of Sleep: 6   Physical Exam: Physical Exam Vitals and nursing note reviewed.  Constitutional:      Appearance: Normal appearance.  HENT:     Head: Normocephalic and atraumatic.     Mouth/Throat:     Pharynx: Oropharynx is clear.  Eyes:     Pupils: Pupils are equal, round, and reactive  to light.  Cardiovascular:     Rate and Rhythm: Normal rate and regular rhythm.  Pulmonary:     Effort: Pulmonary effort is normal.     Breath sounds: Normal breath sounds.  Abdominal:     General: Abdomen is flat.     Palpations: Abdomen is soft.  Musculoskeletal:        General: Normal range of motion.  Skin:    General: Skin is warm and dry.  Neurological:     General: No focal deficit present.     Mental Status: She is alert. Mental status is at baseline.     Comments: Patient is a quadriplegic but is at baseline currently  Psychiatric:        Attention and Perception: Attention normal.        Mood and Affect: Mood is depressed. Affect is blunt.        Speech: Speech is delayed.        Behavior: Behavior is slowed.        Thought Content: Thought content normal. Thought content does not include suicidal ideation.        Cognition and Memory: Cognition normal.    Review of Systems  Constitutional: Negative.   HENT: Negative.    Eyes: Negative.   Respiratory: Negative.    Cardiovascular: Negative.   Gastrointestinal: Negative.   Musculoskeletal: Negative.   Skin: Negative.   Neurological:  Positive for focal weakness and weakness.  Psychiatric/Behavioral:  Positive for depression. Negative for hallucinations, substance abuse and suicidal ideas. The patient is nervous/anxious.    Blood pressure 104/81, pulse 84, temperature (!) 97 F (36.1 C), resp. rate (!) 31, height 5' (1.524 m), weight 79.4 kg, SpO2 92 %. Body mass index is 34.19 kg/m.  Mental Status  Per Nursing Assessment::   On Admission:  Self-harm thoughts  Demographic Factors:  Living alone  Loss Factors: Decline in physical health  Historical Factors: Prior suicide attempts and Family history of mental illness or substance abuse  Risk Reduction Factors:   Positive social support, Positive therapeutic relationship, and Positive coping skills or problem solving skills  Continued Clinical Symptoms:   Depression:   Severe  Cognitive Features That Contribute To Risk:  None    Suicide Risk:  Minimal: No identifiable suicidal ideation.  Patients presenting with no risk factors but with morbid ruminations; may be classified as minimal risk based on the severity of the depressive symptoms    Plan Of Care/Follow-up recommendations:  Other:  Patient is being discharged from psychiatry with the plan that she will be admitted to the medical service.  She continues to be depressed but without any suicidal ideation.  We will follow-up with her after transfer.  Alethia Berthold, MD 01/26/2023, 3:17 PM

## 2023-01-26 NOTE — Procedures (Signed)
ECT SERVICES Physician's Interval Evaluation & Treatment Note  Patient Identification: Natasha [name] MRN:  [id number] Date of Evaluation:  01/26/2023 TX #: 1  MADRS:   MMSE:   P.E. Findings:  No change to physical exam.  Chronic quadriplegia  Psychiatric Interval Note:  Depression and fatigue  Subjective:  Patient is a 52 y.o. female seen for evaluation for Electroconvulsive Therapy. Feeling run down fatigued and dysphoric  Treatment Summary:   [x]$   Right Unilateral             []$  Bilateral   % Energy : 0.3 ms 55%   Impedance: 1330 ohms  Seizure Energy Index: 8896 V squared  Postictal Suppression Index: 47% 99%  Seizure Concordance Index: 99%  Medications  Pre Shock: Brevital 60 mg succinylcholine 80 mg  Post Shock: Versed 2 mg  Seizure Duration: EMG 26 seconds EEG 54 seconds   Comments: Follow-up likely Friday  Lungs:  [x]$   Clear to auscultation               []$  Other:   Heart:    [x]$   Regular rhythm             []$  irregular rhythm    [x]$   Previous H&P reviewed, patient examined and there are NO CHANGES                 []$   Previous H&P reviewed, patient examined and there are changes noted.   Alethia Berthold, MD 2/14/20243:27 PM

## 2023-01-26 NOTE — Assessment & Plan Note (Signed)
-   Continue home baclofen

## 2023-01-26 NOTE — Transfer of Care (Signed)
Immediate Anesthesia Transfer of Care Note  Patient: Natasha [name]  Procedure(s) Performed: ECT TX  Patient Location: PACU  Anesthesia Type:General  Level of Consciousness: awake, alert , and oriented  Airway & Oxygen Therapy: Patient Spontanous Breathing and Patient connected to face mask oxygen  Post-op Assessment: Report given to RN and Post -op Vital signs reviewed and stable  Post vital signs: Reviewed and stable  Last Vitals:  Vitals Value Taken Time  BP 110/79 01/26/23 1312  Temp 36.1 C 01/26/23 1312  Pulse 88 01/26/23 1314  Resp 23 01/26/23 1314  SpO2 90 % 01/26/23 1314  Vitals shown include unvalidated device data.  Last Pain:  Vitals:   01/26/23 1312  TempSrc:   PainSc: 0-No pain         Complications: No notable events documented.

## 2023-01-26 NOTE — H&P (Signed)
History and Physical    Patient: Natasha [name] DOB: 19-Feb-1971 DOA: 01/26/2023 DOS: the patient was seen and examined on 01/26/2023 PCP: Harrison Mons, PA  Patient coming from: Home  Chief Complaint: No chief complaint on file.  HPI: Natasha [name] is a 52 y.o. female with medical history significant of severe depression on ECT, spastic quadriplegia s/p traumatic spinal injury, who presents to the hospital for scheduled ECT therapy.  Postoperatively, TRH contacted due to shortness of breath.  Natasha [name] states that for the last 1 month, she has been experiencing a nonproductive cough that she describes as mild to moderate in nature.  Then approximately 3 weeks ago, she developed fatigue that she was initially concerned may be due to her depression.  She denies any fevers chills, nasal congestion, nausea, vomiting, diarrhea, abdominal pain before coming in today.  She denies any history of choking with food or fluids.  After her procedure, she noted shortness of breath and pleuritic chest pain.  Review of Systems: As mentioned in the history of present illness. All other systems reviewed and are negative.  Past Medical History:  Diagnosis Date   Anxiety    C6 spinal cord injury ([place])    Depression    OCD (obsessive compulsive disorder)    PTSD (post-traumatic stress disorder)    Past Surgical History:  Procedure Laterality Date   BACK SURGERY     NECK SURGERY     Social History:  reports that she has quit smoking. Her smoking use included cigarettes. She has never used smokeless tobacco. She reports current alcohol use. She reports that she does not use drugs.  No Known Allergies  Family History  Problem Relation Age of Onset   Alcohol abuse Father    Alcohol abuse Brother    Depression Mother    Alcohol abuse Mother    Parkinson's disease Mother     Prior to Admission medications   Medication Sig Start Date End Date Taking? Authorizing  Provider  AUVELITY 45-105 MG TBCR Take 1 tablet by mouth in the morning and at bedtime.    [provider]  baclofen (LIORESAL) 10 MG tablet Take 1 tablet (10 mg total) by mouth 3 (three) times daily. 08/05/22   Clapacs, Madie Reno, MD  busPIRone (BUSPAR) 10 MG tablet Take 1 tablet (10 mg total) by mouth 3 (three) times daily. 08/05/22   Clapacs, Madie Reno, MD  clonazePAM (KLONOPIN) 0.5 MG tablet Take 1 tablet (0.5 mg total) by mouth 2 (two) times daily as needed. 08/05/22   Clapacs, Madie Reno, MD  DULoxetine (CYMBALTA) 60 MG capsule Take 2 capsules (120 mg total) by mouth daily. 08/06/22   Clapacs, Madie Reno, MD  famotidine (PEPCID) 20 MG tablet Take 1 tablet (20 mg total) by mouth daily. 08/05/22   Clapacs, Madie Reno, MD  gabapentin (NEURONTIN) 300 MG capsule Take 300 mg by mouth 3 (three) times daily. Patient not taking: Reported on 01/25/2023 10/12/22   [provider]  meclizine (ANTIVERT) 25 MG tablet Take 25 mg by mouth 3 (three) times daily as needed. 10/12/22   [provider]  QUEtiapine (SEROQUEL) 100 MG tablet Take 1 tablet (100 mg total) by mouth at bedtime. 08/05/22   Clapacs, Madie Reno, MD  SUMAtriptan (IMITREX) 100 MG tablet Take 1 tablet (100 mg total) by mouth every 2 (two) hours as needed for migraine. May repeat in 2 hours if headache persists or recurs. 08/05/22   Clapacs, Madie Reno, MD  traMADol (ULTRAM) 50 MG tablet Take 50 mg by mouth daily as needed. Patient not taking: Reported on 01/25/2023 09/01/22   [provider]   Physical Exam: Vitals:   01/26/23 1753  BP: (!) 131/96  Pulse: 99  Resp: 18  Temp: 97.7 F (36.5 C)  TempSrc: Oral  SpO2: 96%    Physical Exam Vitals and nursing note reviewed.  Constitutional:      Appearance: She is normal weight. She is ill-appearing. She is not toxic-appearing.  HENT:     Head: Normocephalic and atraumatic.     Mouth/Throat:     Mouth: Mucous membranes are dry.     Pharynx: Oropharynx is clear.  Eyes:      Conjunctiva/sclera: Conjunctivae normal.     Pupils: Pupils are equal, round, and reactive to light.  Cardiovascular:     Rate and Rhythm: Regular rhythm. Tachycardia present.     Heart sounds: No murmur heard.    No gallop.  Pulmonary:     Effort: Tachypnea present. No accessory muscle usage.     Breath sounds: Wheezing (Diffuse expiratory wheezing) and rales (Bibasilar extending up to mid lung fields bilaterally) present. No decreased breath sounds or rhonchi.  Abdominal:     General: Bowel sounds are normal. There is no distension.     Palpations: Abdomen is soft.  Musculoskeletal:     Right lower leg: No edema.     Left lower leg: No edema.  Skin:    General: Skin is warm and dry.  Neurological:     Mental Status: She is alert and oriented to person, place, and time. Mental status is at baseline.  Psychiatric:        Mood and Affect: Mood normal.        Behavior: Behavior normal.    Data Reviewed: CBC and CMP from today pending  Blood work obtained on 01/25/2023: CBC with WBC of 8.6, hemoglobin of 13.4.  CMP with potassium of 4.2, bicarb 24, glucose 119, creatinine 0.83 and GFR above 60.  Respiratory viral panel negative for COVID-19, influenza and RSV.  X-ray chest PA or AP  Result Date: 01/26/2023 CLINICAL DATA:  Postoperative radiograph for ECT. EXAM: CHEST  1 VIEW COMPARISON:  November 02, 2021 FINDINGS: EKG leads project over the chest. Cardiomediastinal contours and hilar structures are stable. There is near confluent airspace disease in the RIGHT mid and lower chest and perihilar opacities in the LEFT mid chest and lower chest. No sign of pneumothorax. On limited assessment there is no acute skeletal process. IMPRESSION: Near confluent airspace disease in the RIGHT mid and lower chest and perihilar opacities in the LEFT mid chest and lower chest. Findings are suspicious for pneumonia particularly at the RIGHT lung base, consider aspiration. These results will be called to the  ordering clinician or representative by the Radiologist Assistant, and communication documented in the PACS or [employer]. Electronically Signed   By: Zetta Bills M.D.   On: 01/26/2023 14:25    Results are pending, will review when available.  Assessment and Plan:  * Aspiration pneumonia [facility]) Patient presenting with 1 month history of nonproductive cough and fatigue with sudden shortness of breath and acute hypoxia after ECT today.  Chest x-ray was obtained that demonstrated significant bilateral multifocal opacities concerning for aspiration pneumonia.  Patient confirms that she has been n.p.o. since midnight on 2/13.  She denies any prior history of dysphagia or difficulty with choking.  No history of asthma or COPD  -  CT chest ordered for better characterization of extent - Continue supplemental oxygen to maintain oxygen saturation above 88% - Wean as tolerated - Start Unasyn 3 g every 6 hours - Urinary antigens including strep and Legionella ordered - Sputum cultures ordered - N.p.o. - SLP evaluation - Dilaudid as needed for pleuritic chest pain - Incentive spirometry - Trial of DuoNeb given wheezing - IV fluids  Spinal cord injury, cervical region ([place]) - Continue home baclofen  MDD (major depressive disorder), recurrent severe, without psychosis ([place]) - Continue home duloxetine, buspirone  Advance Care Planning:   Code Status: Full Code.  Patient states that she previously considered a DNR, but would like to remain full code at this time as she is still hesitant and unsure.  She notes that she would not want to be on long-term life support under any circumstances though.  Consults: None  Family Communication: No family at bedside  Severity of Illness: The appropriate patient status for this patient is INPATIENT. Inpatient status is judged to be reasonable and necessary in order to provide the required intensity of service to ensure the patient's safety. The  patient's presenting symptoms, physical exam findings, and initial radiographic and laboratory data in the context of their chronic comorbidities is felt to place them at high risk for further clinical deterioration. Furthermore, it is not anticipated that the patient will be medically stable for discharge from the hospital within 2 midnights of admission.   * I certify that at the point of admission it is my clinical judgment that the patient will require inpatient hospital care spanning beyond 2 midnights from the point of admission due to high intensity of service, high risk for further deterioration and high frequency of surveillance required.*  Author: Jose Persia, MD 01/26/2023 5:58 PM  For on call review [email or web address].

## 2023-01-26 NOTE — Tx Team (Signed)
Initial Treatment Plan 01/26/2023 12:47 AM [name] [id number]    PATIENT STRESSORS: Health problems   Medication change or noncompliance     PATIENT STRENGTHS: Ability for insight  Motivation for treatment/growth    PATIENT IDENTIFIED PROBLEMS: Depression  Anxiety                   DISCHARGE CRITERIA:  Improved stabilization in mood, thinking, and/or behavior Verbal commitment to aftercare and medication compliance  PRELIMINARY DISCHARGE PLAN: Outpatient therapy Return to previous living arrangement  PATIENT/FAMILY INVOLVEMENT: This treatment plan has been presented to and reviewed with the patient, Natasha [name]. The patient has been given the opportunity to ask questions and make suggestions.  Mallie Darting, RN 01/26/2023, 12:47 AM

## 2023-01-26 NOTE — Group Note (Signed)
LCSW Group Therapy Note  Group Date: 01/26/2023 Start Time: 1300 End Time: 1400   Type of Therapy and Topic:  Group Therapy - Healthy vs Unhealthy Coping Skills  Participation Level:  Did Not Attend   Description of Group The focus of this group was to determine what unhealthy coping techniques typically are used by group members and what healthy coping techniques would be helpful in coping with various problems. Patients were guided in becoming aware of the differences between healthy and unhealthy coping techniques. Patients were asked to identify 2-3 healthy coping skills they would like to learn to use more effectively.  Therapeutic Goals Patients learned that coping is what human beings do all day long to deal with various situations in their lives Patients defined and discussed healthy vs unhealthy coping techniques Patients identified their preferred coping techniques and identified whether these were healthy or unhealthy Patients determined 2-3 healthy coping skills they would like to become more familiar with and use more often. Patients provided support and ideas to each other   Summary of Patient Progress:   Patient off unit for ECT during time of group.    Therapeutic Modalities Cognitive Behavioral Therapy Motivational Interviewing  Durenda Hurt, Latanya Presser 01/26/2023  2:25 PM

## 2023-01-26 NOTE — BHH Group Notes (Signed)
[place] Group Notes:  (Nursing/MHT/Case Management/Adjunct)  Date:  01/26/2023  Time:  4:25 PM  Type of Therapy:  Psychoeducational Skills  Participation Level:  Did Not Attend   Natasha [name] [facility] 01/26/2023, 4:25 PM

## 2023-01-26 NOTE — Progress Notes (Signed)
Virtual Visit via Telephone Note  I connected with Natasha [name] on 01/26/23 at  1:00 PM EST by telephone and verified that I am speaking with the correct person using two identifiers.  Location: Patient: Home Provider: Hospital   I discussed the limitations, risks, security and privacy concerns of performing an evaluation and management service by telephone and the availability of in person appointments. I also discussed with the patient that there may be a patient responsible charge related to this service. The patient expressed understanding and agreed to proceed.   History of Present Illness: Patient reached by telephone.  52 year old woman with history of severe recurrent depression.  She is wanting to restart ECT.  She denies current suicidal thoughts denies psychosis.  Patient has no way to do outpatient ECT    Observations/Objective: Seems tired and run down but lucid no reported suicidal thoughts  Assessment and Plan: Agree that we can restart ECT but she needs to come into the hospital.  I have informed the admission team to prioritize her admission if she comes in in which case we can start ECT as early as tomorrow  Follow Up Instructions:    I discussed the assessment and treatment plan with the patient. The patient was provided an opportunity to ask questions and all were answered. The patient agreed with the plan and demonstrated an understanding of the instructions.   The patient was advised to call back or seek an in-person evaluation if the symptoms worsen or if the condition fails to improve as anticipated.  I provided 20 minutes of non-face-to-face time during this encounter.   Alethia Berthold, MD

## 2023-01-26 NOTE — Discharge Summary (Signed)
Physician Discharge Summary Note  Patient:  Natasha [name] is an 52 y.o., female MRN:  [id number] DOB:  1971/09/09 Patient phone:  [phone] (home)  Patient address:   [address] [place] [address],  Total Time spent with patient: 30 minutes  Date of Admission:  01/26/2023 Date of Discharge: 01/26/2023  Reason for Admission: Admitted voluntarily after presenting to the emergency room with worsening depression and request to resume ECT treatment.  Principal Problem: MDD (major depressive disorder), recurrent severe, without psychosis ([place]) Discharge Diagnoses: Principal Problem:   MDD (major depressive disorder), recurrent severe, without psychosis ([place]) Active Problems:   Quadriplegia ([place])   Past Psychiatric History: Patient has a longstanding history of recurrent episodes of severe depression with prior suicide attempts.  Patient responds very well to electroconvulsive therapy and has requested admission so that she can have treatment again.  Past Medical History:  Past Medical History:  Diagnosis Date   Anxiety    C6 spinal cord injury ([place])    Depression    OCD (obsessive compulsive disorder)    PTSD (post-traumatic stress disorder)     Past Surgical History:  Procedure Laterality Date   BACK SURGERY     NECK SURGERY     Family History:  Family History  Problem Relation Age of Onset   Alcohol abuse Father    Alcohol abuse Brother    Depression Mother    Alcohol abuse Mother    Parkinson's disease Mother    Family Psychiatric  History: See previous Social History:  Social History   Substance and Sexual Activity  Alcohol Use Yes   Alcohol/week: 0.0 standard drinks of alcohol   Comment: Occasional use     Social History   Substance and Sexual Activity  Drug Use No    Social History   Socioeconomic History   Marital status: Single    Spouse name: Not on file   Number of children: Not on file   Years of education:  Not on file   Highest education level: Not on file  Occupational History   Occupation: Disability  Tobacco Use   Smoking status: Former    Packs/day: 0.00    Types: Cigarettes   Smokeless tobacco: Never  Vaping Use   Vaping Use: Never used  Substance and Sexual Activity   Alcohol use: Yes    Alcohol/week: 0.0 standard drinks of alcohol    Comment: Occasional use   Drug use: No   Sexual activity: Not Currently  Other Topics Concern   Not on file  Social History Narrative   Pt lives alone; receives outpatient psychiatry services through [facility].  Pt is on disability due to a spinal cord injury.    Social Determinants of Health   Financial Resource Strain: Not on file  Food Insecurity: No Food Insecurity (01/26/2023)   Hunger Vital Sign    Worried About Running Out of Food in the Last Year: Never true    Ran Out of Food in the Last Year: Never true  Transportation Needs: No Transportation Needs (01/26/2023)   PRAPARE - Hydrologist (Medical): No    Lack of Transportation (Non-Medical): No  Physical Activity: Not on file  Stress: Not on file  Social Connections: Not on file    Hospital Course: Patient was admitted to the psychiatric unit.  Her usual medicines were continued.  She remained n.p.o. overnight and was seen first thing this morning.  Patient was agreeable to  plan to restart ECT starting today.  Patient came to ECT and was awake and alert and cooperative able to give appropriate history.  She was not complaining of cough or fever but was complaining of feeling very run down and tired and sleepy recently.  Patient had ECT treatment which went fine without complication.  In the PACU afterwards she remained hypoxic with a requirement for face mask oxygen much longer than usually expected.  She was given a DuoNeb treatment without much improvement.  Lungs were wheezing sounded like poor air movement.  Chest x-ray was obtained.  Has patchy  consolidation on both sides suggesting of pneumonia possibly aspiration.  Consulted hospitalist service requested consideration of transfer to medical service for resolution of the current symptoms.  We will follow up with her tomorrow and see whether she is suitable to have ECT on Friday or not depending on how she is feeling and her medical condition.  Physical Findings: AIMS:  , ,  ,  ,    CIWA:    COWS:     Musculoskeletal: Strength & Muscle Tone: decreased and atrophy Gait & Station: unable to stand Patient leans: N/A   Psychiatric Specialty Exam:  Presentation  General Appearance:  Appropriate for Environment  Eye Contact: Good  Speech: Clear and Coherent  Speech Volume: Normal  Handedness: Right   Mood and Affect  Mood: Depressed; Hopeless  Affect: Congruent   Thought Process  Thought Processes: Coherent  Descriptions of Associations:Intact  Orientation:Full (Time, Place and Person)  Thought Content:WDL  History of Schizophrenia/Schizoaffective disorder:No  Duration of Psychotic Symptoms:No data recorded Hallucinations:Hallucinations: None  Ideas of Reference:None  Suicidal Thoughts:Suicidal Thoughts: No  Homicidal Thoughts:Homicidal Thoughts: No   Sensorium  Memory: Immediate Good; Recent Good; Remote Good  Judgment: Good  Insight: Good   Executive Functions  Concentration: Good  Attention Span: Good  Recall: Good  Fund of Knowledge: Good  Language: Good   Psychomotor Activity  Psychomotor Activity: Psychomotor Activity: Normal   Assets  Assets: Communication Skills; Desire for Improvement   Sleep  Sleep: Sleep: Fair Number of Hours of Sleep: 6    Physical Exam: Physical Exam Vitals and nursing note reviewed.  Constitutional:      Appearance: Normal appearance.  HENT:     Head: Normocephalic and atraumatic.     Mouth/Throat:     Pharynx: Oropharynx is clear.  Eyes:     Pupils: Pupils are  equal, round, and reactive to light.  Cardiovascular:     Rate and Rhythm: Normal rate and regular rhythm.  Pulmonary:     Effort: Pulmonary effort is normal.     Breath sounds: Normal breath sounds.  Abdominal:     General: Abdomen is flat.     Palpations: Abdomen is soft.  Musculoskeletal:        General: Normal range of motion.  Skin:    General: Skin is warm and dry.  Neurological:     General: No focal deficit present.     Mental Status: She is alert. Mental status is at baseline.  Psychiatric:        Attention and Perception: Attention normal.        Mood and Affect: Mood is depressed. Affect is blunt.        Speech: Speech is delayed.        Behavior: Behavior is slowed.        Thought Content: Thought content normal. Thought content does not include suicidal ideation.  Cognition and Memory: Cognition normal.    Review of Systems  Constitutional: Negative.   HENT: Negative.    Eyes: Negative.   Respiratory: Negative.    Cardiovascular: Negative.   Gastrointestinal: Negative.   Musculoskeletal: Negative.   Skin: Negative.   Neurological: Negative.   Psychiatric/Behavioral:  Positive for depression. Negative for hallucinations, substance abuse and suicidal ideas. The patient is nervous/anxious.    Blood pressure 104/81, pulse 84, temperature (!) 97 F (36.1 C), resp. rate (!) 31, height 5' (1.524 m), weight 79.4 kg, SpO2 92 %. Body mass index is 34.19 kg/m.   Social History   Tobacco Use  Smoking Status Former   Packs/day: 0.00   Types: Cigarettes  Smokeless Tobacco Never   Tobacco Cessation:  N/A, patient does not currently use tobacco products   Blood Alcohol level:  Lab Results  Component Value Date   ETH <10 01/25/2023   ETH <10 0000000    Metabolic Disorder Labs:  Lab Results  Component Value Date   HGBA1C 5.1 01/27/2022   MPG 99.67 01/27/2022   MPG 85.32 10/16/2020   Lab Results  Component Value Date   PROLACTIN 121.9 (H)  02/03/2017   Lab Results  Component Value Date   CHOL 236 (H) 10/16/2020   TRIG 94 10/16/2020   HDL 78 10/16/2020   CHOLHDL 3.0 10/16/2020   VLDL 19 10/16/2020   LDLCALC 139 (H) 10/16/2020   LDLCALC 160 (H) 05/26/2020    See Psychiatric Specialty Exam and Suicide Risk Assessment completed by Attending Physician prior to discharge.  Discharge destination:  Other:  Transferred to the medical unit with our hospital  Is patient on multiple antipsychotic therapies at discharge:  No   Has Patient had three or more failed trials of antipsychotic monotherapy by history:  No  Recommended Plan for Multiple Antipsychotic Therapies: NA  Discharge Instructions     Diet - low sodium heart healthy   Complete by: As directed    Increase activity slowly   Complete by: As directed       Allergies as of 01/26/2023   No Known Allergies      Medication List     STOP taking these medications    Auvelity 45-105 MG Tbcr Generic drug: Dextromethorphan-buPROPion ER   traMADol 50 MG tablet Commonly known as: ULTRAM       TAKE these medications      Indication  baclofen 10 MG tablet Commonly known as: LIORESAL Take 1 tablet (10 mg total) by mouth 3 (three) times daily.  Indication: Muscle Spasticity   busPIRone 10 MG tablet Commonly known as: BUSPAR Take 1 tablet (10 mg total) by mouth 3 (three) times daily.  Indication: Major Depressive Disorder   clonazePAM 0.5 MG tablet Commonly known as: KLONOPIN Take 1 tablet (0.5 mg total) by mouth 2 (two) times daily as needed.  Indication: Feeling Anxious   DULoxetine 60 MG capsule Commonly known as: CYMBALTA Take 2 capsules (120 mg total) by mouth daily.  Indication: Major Depressive Disorder   famotidine 20 MG tablet Commonly known as: PEPCID Take 1 tablet (20 mg total) by mouth daily.  Indication: Gastroesophageal Reflux Disease   gabapentin 300 MG capsule Commonly known as: NEURONTIN Take 300 mg by mouth 3 (three) times  daily.  Indication: Fibromyalgia Syndrome   meclizine 25 MG tablet Commonly known as: ANTIVERT Take 25 mg by mouth 3 (three) times daily as needed.  Indication: Nausea   QUEtiapine 100 MG tablet Commonly known as: SEROQUEL  Take 1 tablet (100 mg total) by mouth at bedtime.  Indication: Major Depressive Disorder   SUMAtriptan 100 MG tablet Commonly known as: IMITREX Take 1 tablet (100 mg total) by mouth every 2 (two) hours as needed for migraine. May repeat in 2 hours if headache persists or recurs.  Indication: Migraine Headache         Follow-up recommendations:  Other:  Recommend continuing basic psychiatric medicine as currently ordered.  Attempted to leave things in place as she is currently taking them right now.  Please contact psychiatric service and we will follow-up with her.  Call me for any questions.  Comments: See above  Signed: Alethia Berthold, MD 01/26/2023, 3:20 PM

## 2023-01-26 NOTE — Assessment & Plan Note (Signed)
-   Continue home duloxetine, buspirone.  Status post session of ECT on 2/14.  Psychiatry followed up in the hospital who recommended no ECT on 2/16 as patient is still recovering.  Outpatient follow-up

## 2023-01-26 NOTE — [facility] Suicide Risk Assessment (Signed)
[facility] Admission Suicide Risk Assessment   Nursing information obtained from:  Patient Demographic factors:  Low socioeconomic status, Caucasian Current Mental Status:  Self-harm thoughts Loss Factors:  NA Historical Factors:  Impulsivity Risk Reduction Factors:  Positive therapeutic relationship, Positive social support  Total Time spent with patient: 45 minutes Principal Problem: MDD (major depressive disorder), recurrent severe, without psychosis ([place]) Diagnosis:  Principal Problem:   MDD (major depressive disorder), recurrent severe, without psychosis ([place]) Active Problems:   Quadriplegia ([place] )  Subjective Data: Patient well-known to [place] with a long history of recurrent severe depression.  Reports depressed mood fatigue overly sleepy no concentration poor function for the last 2 weeks or so.  Denies suicidal ideation.  No change in medications.  No change in social situation  Continued Clinical Symptoms:  Alcohol Use Disorder Identification Test Final Score (AUDIT): 0 The "Alcohol Use Disorders Identification Test", Guidelines for Use in Primary Care, Second Edition.  World Pharmacologist [facility]). Score between 0-7:  no or low risk or alcohol related problems. Score between 8-15:  moderate risk of alcohol related problems. Score between 16-19:  high risk of alcohol related problems. Score 20 or above:  warrants further diagnostic evaluation for alcohol dependence and treatment.   CLINICAL FACTORS:   Depression:   Severe   Musculoskeletal: Strength & Muscle Tone: decreased Gait & Station: unable to stand Patient leans: N/A  Psychiatric Specialty Exam:  Presentation  General Appearance:  Appropriate for Environment  Eye Contact: Good  Speech: Clear and Coherent  Speech Volume: Normal  Handedness: Right   Mood and Affect  Mood: Depressed; Hopeless  Affect: Congruent   Thought Process  Thought Processes: Coherent  Descriptions of  Associations:Intact  Orientation:Full (Time, Place and Person)  Thought Content:WDL  History of Schizophrenia/Schizoaffective disorder:No  Duration of Psychotic Symptoms:No data recorded Hallucinations:Hallucinations: None  Ideas of Reference:None  Suicidal Thoughts:Suicidal Thoughts: No  Homicidal Thoughts:Homicidal Thoughts: No   Sensorium  Memory: Immediate Good; Recent Good; Remote Good  Judgment: Good  Insight: Good   Executive Functions  Concentration: Good  Attention Span: Good  Recall: Good  Fund of Knowledge: Good  Language: Good   Psychomotor Activity  Psychomotor Activity: Psychomotor Activity: Normal   Assets  Assets: Communication Skills; Desire for Improvement   Sleep  Sleep: Sleep: Fair Number of Hours of Sleep: 6    Physical Exam: Physical Exam Vitals and nursing note reviewed.  Constitutional:      Appearance: Normal appearance.  HENT:     Head: Normocephalic and atraumatic.     Mouth/Throat:     Pharynx: Oropharynx is clear.  Eyes:     Pupils: Pupils are equal, round, and reactive to light.  Cardiovascular:     Rate and Rhythm: Normal rate and regular rhythm.  Pulmonary:     Effort: Pulmonary effort is normal.     Breath sounds: Normal breath sounds.  Abdominal:     General: Abdomen is flat.     Palpations: Abdomen is soft.  Musculoskeletal:        General: Normal range of motion.  Skin:    General: Skin is warm and dry.  Neurological:     General: No focal deficit present.     Mental Status: She is alert. Mental status is at baseline.     Comments: Patient is quadriplegic with very very limited motion in her lower extremities.  She has useful function but significant weakness in her upper extremities.  Psychiatric:  Attention and Perception: Attention normal.        Mood and Affect: Mood is depressed.        Speech: Speech normal.        Behavior: Behavior is cooperative.        Thought Content:  Thought content normal.        Cognition and Memory: Cognition normal.        Judgment: Judgment normal.    Review of Systems  Constitutional: Negative.   HENT: Negative.    Eyes: Negative.   Respiratory: Negative.    Cardiovascular: Negative.   Gastrointestinal: Negative.   Musculoskeletal: Negative.   Skin: Negative.   Neurological: Negative.   Psychiatric/Behavioral:  Positive for depression. Negative for hallucinations, substance abuse and suicidal ideas. The patient does not have insomnia.    Blood pressure 104/81, pulse 84, temperature (!) 97 F (36.1 C), resp. rate (!) 31, height 5' (1.524 m), weight 79.4 kg, SpO2 92 %. Body mass index is 34.19 kg/m.   COGNITIVE FEATURES THAT CONTRIBUTE TO RISK:  None    SUICIDE RISK:   Minimal: No identifiable suicidal ideation.  Patients presenting with no risk factors but with morbid ruminations; may be classified as minimal risk based on the severity of the depressive symptoms  PLAN OF CARE: Patient is requesting resumption of ECT which historically has been highly effective.  She will be on 15-minute checks and we will plan to resume ECT as soon as possible.  Continue other medicine.  Ongoing assessment of dangerousness prior to discharge  I certify that inpatient services furnished can reasonably be expected to improve the patient's condition.   Alethia Berthold, MD 01/26/2023, 3:09 PM

## 2023-01-26 NOTE — H&P (Signed)
Psychiatric Admission Assessment Adult  Patient Identification: Natasha [name] MRN:  [id number] Date of Evaluation:  01/26/2023 Chief Complaint:  MDD (major depressive disorder), recurrent severe, without psychosis ([place]) [F33.2] Principal Diagnosis: MDD (major depressive disorder), recurrent severe, without psychosis ([place]) Diagnosis:  Principal Problem:   MDD (major depressive disorder), recurrent severe, without psychosis ([place]) Active Problems:   Quadriplegia ([place])  History of Present Illness: 52 year old woman with a history of recurrent severe depression presented to the emergency room after calling and requesting ECT treatment.  Patient well-known to our service.  She reports that for the past 2 weeks approximately her mood has been more down and she has been feeling fatigued and sleepy all the time with decreased concentration and poor functioning.  She denies having suicidal thoughts.  Denies psychosis.  States that she is still taking all of her regular medications.  Does not report any new specific focal medical issue.  Denies substance abuse.  Specifically requesting a return of ECT treatment Associated Signs/Symptoms: Depression Symptoms:  depressed mood, hypersomnia, psychomotor retardation, fatigue, anxiety, (Hypo) Manic Symptoms:   None Anxiety Symptoms:  Excessive Worry, Psychotic Symptoms:   None PTSD Symptoms: Negative Total Time spent with patient: 45 minutes  Past Psychiatric History: Long history of severe recurrent depression.  Patient has a history of serious suicide attempts 1 of which resulted in her quadriplegia.  She has uniquely responded well to electroconvulsive therapy and has had multiple episodes of treatment.  Does have an outpatient provider as well.  Is the patient at risk to self? Yes.    Has the patient been a risk to self in the past 6 months? Yes.    Has the patient been a risk to self within the distant past? Yes.    Is the patient a risk  to others? No.  Has the patient been a risk to others in the past 6 months? No.  Has the patient been a risk to others within the distant past? No.   [place] Scale:  [place] Admission (Current) from 01/26/2023 in [place] ED from 01/25/2023 in [facility] Emergency Department at [facility] Admission (Discharged) from 07/28/2022 in [place] Risk No Risk No Risk        Prior Inpatient Therapy: Yes.   If yes, describe multiple episodes for depression Prior Outpatient Therapy: Yes.   If yes, describe has outpatient treatment  Alcohol Screening: 1. How often do you have a drink containing alcohol?: Never 2. How many drinks containing alcohol do you have on a typical day when you are drinking?: 1 or 2 3. How often do you have six or more drinks on one occasion?: Never AUDIT-C Score: 0 4. How often during the last year have you found that you were not able to stop drinking once you had started?: Never 5. How often during the last year have you failed to do what was normally expected from you because of drinking?: Never 6. How often during the last year have you needed a first drink in the morning to get yourself going after a heavy drinking session?: Never 7. How often during the last year have you had a feeling of guilt of remorse after drinking?: Never 8. How often during the last year have you been unable to remember what happened the night before because you had been drinking?: Never 9. Have you or someone else been injured as a result of your drinking?: No 10.  Has a relative or friend or a doctor or another health worker been concerned about your drinking or suggested you cut down?: No Alcohol Use Disorder Identification Test Final Score (AUDIT): 0 Substance Abuse History in the last 12 months:  No. Consequences of Substance Abuse: Negative Previous Psychotropic Medications: Yes  Psychological  Evaluations: Yes  Past Medical History:  Past Medical History:  Diagnosis Date   Anxiety    C6 spinal cord injury ([place])    Depression    OCD (obsessive compulsive disorder)    PTSD (post-traumatic stress disorder)     Past Surgical History:  Procedure Laterality Date   BACK SURGERY     NECK SURGERY     Family History:  Family History  Problem Relation Age of Onset   Alcohol abuse Father    Alcohol abuse Brother    Depression Mother    Alcohol abuse Mother    Parkinson's disease Mother    Family Psychiatric  History: History of alcohol abuse and depression in the family Tobacco Screening:  Social History   Tobacco Use  Smoking Status Former   Packs/day: 0.00   Types: Cigarettes  Smokeless Tobacco Never    BH Tobacco Counseling     Are you interested in Tobacco Cessation Medications?  No, patient refused Counseled patient on smoking cessation:  Refused/Declined practical counseling Reason Tobacco Screening Not Completed: Patient Refused Screening       Social History:  Social History   Substance and Sexual Activity  Alcohol Use Yes   Alcohol/week: 0.0 standard drinks of alcohol   Comment: Occasional use     Social History   Substance and Sexual Activity  Drug Use No    Additional Social History:                           Allergies:  No Known Allergies Lab Results:  Results for orders placed or performed during the hospital encounter of 01/26/23 (from the past 48 hour(s))  Glucose, capillary     Status: None   Collection Time: 01/26/23  6:37 AM  Result Value Ref Range   Glucose-Capillary 95 70 - 99 mg/dL    Comment: Glucose reference range applies only to samples taken after fasting for at least 8 hours.   Comment 1 Notify RN     Blood Alcohol level:  Lab Results  Component Value Date   ETH <10 01/25/2023   ETH <10 0000000    Metabolic Disorder Labs:  Lab Results  Component Value Date   HGBA1C 5.1 01/27/2022   MPG 99.67  01/27/2022   MPG 85.32 10/16/2020   Lab Results  Component Value Date   PROLACTIN 121.9 (H) 02/03/2017   Lab Results  Component Value Date   CHOL 236 (H) 10/16/2020   TRIG 94 10/16/2020   HDL 78 10/16/2020   CHOLHDL 3.0 10/16/2020   VLDL 19 10/16/2020   LDLCALC 139 (H) 10/16/2020   LDLCALC 160 (H) 05/26/2020    Current Medications: Current Facility-Administered Medications  Medication Dose Route Frequency Provider Last Rate Last Admin   0.9 %  sodium chloride infusion  500 mL Intravenous Once Ande Therrell T, MD       acetaminophen (TYLENOL) tablet 650 mg  650 mg Oral Q6H PRN Caroline Sauger, NP       alum & mag hydroxide-simeth (MAALOX/MYLANTA) 200-200-20 MG/5ML suspension 30 mL  30 mL Oral Q4H PRN Caroline Sauger, NP  baclofen (LIORESAL) tablet 10 mg  10 mg Oral TID Caroline Sauger, NP       busPIRone (BUSPAR) tablet 10 mg  10 mg Oral TID Caroline Sauger, NP       clonazePAM Bobbye [place]) tablet 0.5 mg  0.5 mg Oral BID PRN Caroline Sauger, NP       DULoxetine (CYMBALTA) DR capsule 120 mg  120 mg Oral Daily Caroline Sauger, NP       famotidine (PEPCID) tablet 20 mg  20 mg Oral Daily Caroline Sauger, NP       ipratropium-albuterol (DUONEB) 0.5-2.5 (3) MG/3ML nebulizer solution 3 mL  3 mL Nebulization Q4H [facility], Gijsbertus F, MD       risperiDONE (RISPERDAL M-TABS) disintegrating tablet 2 mg  2 mg Oral Q8H PRN Caroline Sauger, NP       And   LORazepam (ATIVAN) tablet 1 mg  1 mg Oral PRN Caroline Sauger, NP       And   ziprasidone (GEODON) injection 20 mg  20 mg Intramuscular PRN Caroline Sauger, NP       magnesium hydroxide (MILK OF MAGNESIA) suspension 30 mL  30 mL Oral Daily PRN Caroline Sauger, NP       midazolam (VERSED) injection 2 mg  2 mg Intravenous Once Stephanos Fan, Madie Reno, MD       QUEtiapine (SEROQUEL) tablet 100 mg  100 mg Oral QHS Caroline Sauger, NP       Facility-Administered Medications Ordered in  Other Encounters  Medication Dose Route Frequency Provider Last Rate Last Admin   ipratropium-albuterol (DUONEB) 0.5-2.5 (3) MG/3ML nebulizer solution            PTA Medications: Medications Prior to Admission  Medication Sig Dispense Refill Last Dose   AUVELITY 45-105 MG TBCR Take 1 tablet by mouth in the morning and at bedtime.      baclofen (LIORESAL) 10 MG tablet Take 1 tablet (10 mg total) by mouth 3 (three) times daily. 90 each 0    busPIRone (BUSPAR) 10 MG tablet Take 1 tablet (10 mg total) by mouth 3 (three) times daily. 90 tablet 0    clonazePAM (KLONOPIN) 0.5 MG tablet Take 1 tablet (0.5 mg total) by mouth 2 (two) times daily as needed. 30 tablet 0    DULoxetine (CYMBALTA) 60 MG capsule Take 2 capsules (120 mg total) by mouth daily. 60 capsule 0    famotidine (PEPCID) 20 MG tablet Take 1 tablet (20 mg total) by mouth daily. 30 tablet 0    gabapentin (NEURONTIN) 300 MG capsule Take 300 mg by mouth 3 (three) times daily. (Patient not taking: Reported on 01/25/2023)      meclizine (ANTIVERT) 25 MG tablet Take 25 mg by mouth 3 (three) times daily as needed.      QUEtiapine (SEROQUEL) 100 MG tablet Take 1 tablet (100 mg total) by mouth at bedtime. 30 tablet 0    SUMAtriptan (IMITREX) 100 MG tablet Take 1 tablet (100 mg total) by mouth every 2 (two) hours as needed for migraine. May repeat in 2 hours if headache persists or recurs. 10 tablet 0    traMADol (ULTRAM) 50 MG tablet Take 50 mg by mouth daily as needed. (Patient not taking: Reported on 01/25/2023)       Musculoskeletal: Strength & Muscle Tone: decreased and atrophy Gait & Station: unable to stand Patient leans: N/A            Psychiatric Specialty Exam:  Presentation  General Appearance:  Appropriate  for Environment  Eye Contact: Good  Speech: Clear and Coherent  Speech Volume: Normal  Handedness: Right   Mood and Affect  Mood: Depressed; Hopeless  Affect: Congruent   Thought Process  Thought  Processes: Coherent  Duration of Psychotic Symptoms:N/A Past Diagnosis of Schizophrenia or Psychoactive disorder: No  Descriptions of Associations:Intact  Orientation:Full (Time, Place and Person)  Thought Content:WDL  Hallucinations:Hallucinations: None  Ideas of Reference:None  Suicidal Thoughts:Suicidal Thoughts: No  Homicidal Thoughts:Homicidal Thoughts: No   Sensorium  Memory: Immediate Good; Recent Good; Remote Good  Judgment: Good  Insight: Good   Executive Functions  Concentration: Good  Attention Span: Good  Recall: Good  Fund of Knowledge: Good  Language: Good   Psychomotor Activity  Psychomotor Activity: Psychomotor Activity: Normal   Assets  Assets: Communication Skills; Desire for Improvement   Sleep  Sleep: Sleep: Fair Number of Hours of Sleep: 6    Physical Exam: Physical Exam Vitals and nursing note reviewed.  Constitutional:      Appearance: Normal appearance.  HENT:     Head: Normocephalic and atraumatic.     Mouth/Throat:     Pharynx: Oropharynx is clear.  Eyes:     Pupils: Pupils are equal, round, and reactive to light.  Cardiovascular:     Rate and Rhythm: Normal rate and regular rhythm.  Pulmonary:     Effort: Pulmonary effort is normal.     Breath sounds: Normal breath sounds.  Abdominal:     General: Abdomen is flat.     Palpations: Abdomen is soft.  Musculoskeletal:        General: Normal range of motion.  Skin:    General: Skin is warm and dry.  Neurological:     Mental Status: She is alert.     Comments: Patient is a quadriplegic which is a chronic condition.  She has extremely limited movement in her lower extremities but has some useful function in her upper extremities albeit with significant weakness.  She is at her baseline currently and in terms of motor neurologic function  Psychiatric:        Attention and Perception: Attention normal.        Mood and Affect: Mood is depressed. Affect is  blunt.        Speech: Speech is delayed.        Behavior: Behavior is slowed and withdrawn.        Thought Content: Thought content does not include suicidal ideation.        Cognition and Memory: Cognition normal.        Judgment: Judgment normal.    Review of Systems  Constitutional:  Positive for malaise/fatigue.  HENT: Negative.    Eyes: Negative.   Respiratory: Negative.    Cardiovascular: Negative.   Gastrointestinal: Negative.   Musculoskeletal: Negative.   Skin: Negative.   Neurological:  Positive for weakness.  Psychiatric/Behavioral:  Positive for depression. Negative for hallucinations, substance abuse and suicidal ideas. The patient is not nervous/anxious and does not have insomnia.    Blood pressure 104/81, pulse 84, temperature (!) 97 F (36.1 C), resp. rate (!) 31, height 5' (1.524 m), weight 79.4 kg, SpO2 92 %. Body mass index is 34.19 kg/m.  Treatment Plan Summary: Daily contact with patient to assess and evaluate symptoms and progress in treatment, Medication management, and Plan continue current medicine.  Patient had specifically requested to resume ECT treatment.  She is unable to do outpatient treatment because of her medical condition and lack  of transportation.  She is being admitted with the plan to restart ECT starting today.  Observation Level/Precautions:  15 minute checks  Laboratory:  Chemistry Profile  Psychotherapy:    Medications:    Consultations:    Discharge Concerns:    Estimated LOS:  Other:     Physician Treatment Plan for Primary Diagnosis: MDD (major depressive disorder), recurrent severe, without psychosis ([place]) Long Term Goal(s): Improvement in symptoms so as ready for discharge  Short Term Goals: Ability to verbalize feelings will improve, Ability to disclose and discuss suicidal ideas, and Ability to demonstrate self-control will improve  Physician Treatment Plan for Secondary Diagnosis: Principal Problem:   MDD (major depressive  disorder), recurrent severe, without psychosis ([place]) Active Problems:   Quadriplegia ([place])  Long Term Goal(s): Improvement in symptoms so as ready for discharge  Short Term Goals: Ability to disclose and discuss suicidal ideas, Ability to demonstrate self-control will improve, and Ability to identify and develop effective coping behaviors will improve  I certify that inpatient services furnished can reasonably be expected to improve the patient's condition.    Alethia Berthold, MD 2/14/20243:12 PM

## 2023-01-26 NOTE — BH IP Treatment Plan (Signed)
Interdisciplinary Treatment and Diagnostic Plan Update  01/26/2023 Time of Session: 9:10AM Natasha [name] MRN: [id number]  Principal Diagnosis: <principal problem not specified>  Secondary Diagnoses: Active Problems:   MDD (major depressive disorder), recurrent severe, without psychosis ([place])   Current Medications:  Current Facility-Administered Medications  Medication Dose Route Frequency Provider Last Rate Last Admin   acetaminophen (TYLENOL) tablet 650 mg  650 mg Oral Q6H PRN Caroline Sauger, NP       alum & mag hydroxide-simeth (MAALOX/MYLANTA) 200-200-20 MG/5ML suspension 30 mL  30 mL Oral Q4H PRN Caroline Sauger, NP       baclofen (LIORESAL) tablet 10 mg  10 mg Oral TID Caroline Sauger, NP       busPIRone (BUSPAR) tablet 10 mg  10 mg Oral TID Caroline Sauger, NP       clonazePAM Bobbye [place]) tablet 0.5 mg  0.5 mg Oral BID PRN Caroline Sauger, NP       DULoxetine (CYMBALTA) DR capsule 120 mg  120 mg Oral Daily Caroline Sauger, NP       famotidine (PEPCID) tablet 20 mg  20 mg Oral Daily Caroline Sauger, NP       risperiDONE (RISPERDAL M-TABS) disintegrating tablet 2 mg  2 mg Oral Q8H PRN Caroline Sauger, NP       And   LORazepam (ATIVAN) tablet 1 mg  1 mg Oral PRN Caroline Sauger, NP       And   ziprasidone (GEODON) injection 20 mg  20 mg Intramuscular PRN Caroline Sauger, NP       magnesium hydroxide (MILK OF MAGNESIA) suspension 30 mL  30 mL Oral Daily PRN Caroline Sauger, NP       QUEtiapine (SEROQUEL) tablet 100 mg  100 mg Oral QHS Caroline Sauger, NP       PTA Medications: Medications Prior to Admission  Medication Sig Dispense Refill Last Dose   AUVELITY 45-105 MG TBCR Take 1 tablet by mouth in the morning and at bedtime.      baclofen (LIORESAL) 10 MG tablet Take 1 tablet (10 mg total) by mouth 3 (three) times daily. 90 each 0    busPIRone (BUSPAR) 10 MG tablet Take 1 tablet (10 mg total) by mouth 3  (three) times daily. 90 tablet 0    clonazePAM (KLONOPIN) 0.5 MG tablet Take 1 tablet (0.5 mg total) by mouth 2 (two) times daily as needed. 30 tablet 0    DULoxetine (CYMBALTA) 60 MG capsule Take 2 capsules (120 mg total) by mouth daily. 60 capsule 0    famotidine (PEPCID) 20 MG tablet Take 1 tablet (20 mg total) by mouth daily. 30 tablet 0    gabapentin (NEURONTIN) 300 MG capsule Take 300 mg by mouth 3 (three) times daily. (Patient not taking: Reported on 01/25/2023)      meclizine (ANTIVERT) 25 MG tablet Take 25 mg by mouth 3 (three) times daily as needed.      QUEtiapine (SEROQUEL) 100 MG tablet Take 1 tablet (100 mg total) by mouth at bedtime. 30 tablet 0    SUMAtriptan (IMITREX) 100 MG tablet Take 1 tablet (100 mg total) by mouth every 2 (two) hours as needed for migraine. May repeat in 2 hours if headache persists or recurs. 10 tablet 0    traMADol (ULTRAM) 50 MG tablet Take 50 mg by mouth daily as needed. (Patient not taking: Reported on 01/25/2023)       Patient Stressors: Health problems   Medication change or noncompliance    Patient  Strengths: Ability for insight  Motivation for treatment/growth   Treatment Modalities: Medication Management, Group therapy, Case management,  1 to 1 session with clinician, Psychoeducation, Recreational therapy.   Physician Treatment Plan for Primary Diagnosis: <principal problem not specified> Long Term Goal(s):     Short Term Goals:    Medication Management: Evaluate patient's response, side effects, and tolerance of medication regimen.  Therapeutic Interventions: 1 to 1 sessions, Unit Group sessions and Medication administration.  Evaluation of Outcomes: Not Met  Physician Treatment Plan for Secondary Diagnosis: Active Problems:   MDD (major depressive disorder), recurrent severe, without psychosis ([place])  Long Term Goal(s):     Short Term Goals:       Medication Management: Evaluate patient's response, side effects, and tolerance of  medication regimen.  Therapeutic Interventions: 1 to 1 sessions, Unit Group sessions and Medication administration.  Evaluation of Outcomes: Not Met   RN Treatment Plan for Primary Diagnosis: <principal problem not specified> Long Term Goal(s): Knowledge of disease and therapeutic regimen to maintain health will improve  Short Term Goals: Ability to demonstrate self-control, Ability to participate in decision making will improve, Ability to verbalize feelings will improve, Ability to disclose and discuss suicidal ideas, Ability to identify and develop effective coping behaviors will improve, and Compliance with prescribed medications will improve  Medication Management: RN will administer medications as ordered by provider, will assess and evaluate patient's response and provide education to patient for prescribed medication. RN will report any adverse and/or side effects to prescribing provider.  Therapeutic Interventions: 1 on 1 counseling sessions, Psychoeducation, Medication administration, Evaluate responses to treatment, Monitor vital signs and CBGs as ordered, Perform/monitor CIWA, COWS, AIMS and Fall Risk screenings as ordered, Perform wound care treatments as ordered.  Evaluation of Outcomes: Not Met   LCSW Treatment Plan for Primary Diagnosis: <principal problem not specified> Long Term Goal(s): Safe transition to appropriate next level of care at discharge, Engage patient in therapeutic group addressing interpersonal concerns.  Short Term Goals: Engage patient in aftercare planning with referrals and resources, Increase social support, Increase ability to appropriately verbalize feelings, Increase emotional regulation, Facilitate acceptance of mental health diagnosis and concerns, and Increase skills for wellness and recovery  Therapeutic Interventions: Assess for all discharge needs, 1 to 1 time with Social worker, Explore available resources and support systems, Assess for  adequacy in community support network, Educate family and significant other(s) on suicide prevention, Complete Psychosocial Assessment, Interpersonal group therapy.  Evaluation of Outcomes: Not Met   Progress in Treatment: Attending groups: No. Participating in groups: No. Taking medication as prescribed: Yes. Toleration medication: Yes. Family/Significant other contact made: No, will contact:  once permission is given. Patient understands diagnosis: Yes. Discussing patient identified problems/goals with staff: Yes. Medical problems stabilized or resolved: Yes. Denies suicidal/homicidal ideation: Yes. Issues/concerns per patient self-inventory: No. Other: none  New problem(s) identified: No, Describe:  none  New Short Term/Long Term Goal(s): detox, elimination of symptoms of psychosis, medication management for mood stabilization; elimination of SI thoughts; development of comprehensive mental wellness/sobriety plan.   Patient Goals:  "I just need to feel better and feel functional"  Discharge Plan or Barriers: Patient admitted for ECT.  CSW to assist in development of an appropriate discharge plan.    Reason for Continuation of Hospitalization: Anxiety Depression Medication stabilization Suicidal ideation  Estimated Length of Stay:  1-5 days   Last 3 [place] Suicide Severity Risk Score: Flowsheet Row Admission (Current) from 01/26/2023 in [place] ED from  01/25/2023 in [facility] at [facility] Admission (Discharged) from 07/28/2022 in [place] Low Risk No Risk No Risk       Last PHQ 2/9 Scores:    06/07/2022    2:34 PM 06/24/2016    1:32 PM 11/04/2015    1:41 PM  Depression screen PHQ 2/9  Decreased Interest 3 1 3  $ Down, Depressed, Hopeless 3 1 3  $ PHQ - 2 Score 6 2 6  $ Altered sleeping 3 0 0  Tired, decreased energy 3 1 3  $ Change in appetite 3 0 0  Feeling bad or  failure about yourself  3 3 3  $ Trouble concentrating 3 0 1  Moving slowly or fidgety/restless 3 0 0  Suicidal thoughts 3 1 3  $ PHQ-9 Score 27 7 16  $ Difficult doing work/chores Somewhat difficult Not difficult at all Somewhat difficult    Scribe for Treatment Team: Rozann Lesches, Marlinda Mike 01/26/2023 9:36 AM

## 2023-01-26 NOTE — Progress Notes (Signed)
Patient cam and pleasant during admission stating that she has been depressed and wanted to come in for ECT. Pt well known by staff from previous admissions for ECT. Patient has ECT scheduled in the morning, education given to patient. Pt oriented to the unit and her room. Pt given education, support, and encouragement to be active in her treatment plan. Pt being monitored Q 15 minutes for safety per unit protocol, remains safe on the unit.

## 2023-01-26 NOTE — H&P (Signed)
Natasha [name] is an 52 y.o. female.   Chief Complaint: Complains of mood being worse and having severe fatigue for 2 to 3 weeks. HPI: History of recurrent severe depression with previous suicide attempts resulting in chronic serious medical conditions  Past Medical History:  Diagnosis Date   Anxiety    C6 spinal cord injury ([place])    Depression    OCD (obsessive compulsive disorder)    PTSD (post-traumatic stress disorder)     Past Surgical History:  Procedure Laterality Date   BACK SURGERY     NECK SURGERY      Family History  Problem Relation Age of Onset   Alcohol abuse Father    Alcohol abuse Brother    Depression Mother    Alcohol abuse Mother    Parkinson's disease Mother    Social History:  reports that she has quit smoking. Her smoking use included cigarettes. She has never used smokeless tobacco. She reports current alcohol use. She reports that she does not use drugs.  Allergies: No Known Allergies  Medications Prior to Admission  Medication Sig Dispense Refill   AUVELITY 45-105 MG TBCR Take 1 tablet by mouth in the morning and at bedtime.     baclofen (LIORESAL) 10 MG tablet Take 1 tablet (10 mg total) by mouth 3 (three) times daily. 90 each 0   busPIRone (BUSPAR) 10 MG tablet Take 1 tablet (10 mg total) by mouth 3 (three) times daily. 90 tablet 0   clonazePAM (KLONOPIN) 0.5 MG tablet Take 1 tablet (0.5 mg total) by mouth 2 (two) times daily as needed. 30 tablet 0   DULoxetine (CYMBALTA) 60 MG capsule Take 2 capsules (120 mg total) by mouth daily. 60 capsule 0   famotidine (PEPCID) 20 MG tablet Take 1 tablet (20 mg total) by mouth daily. 30 tablet 0   gabapentin (NEURONTIN) 300 MG capsule Take 300 mg by mouth 3 (three) times daily. (Patient not taking: Reported on 01/25/2023)     meclizine (ANTIVERT) 25 MG tablet Take 25 mg by mouth 3 (three) times daily as needed.     QUEtiapine (SEROQUEL) 100 MG tablet Take 1 tablet (100 mg total) by mouth at bedtime. 30  tablet 0   SUMAtriptan (IMITREX) 100 MG tablet Take 1 tablet (100 mg total) by mouth every 2 (two) hours as needed for migraine. May repeat in 2 hours if headache persists or recurs. 10 tablet 0   traMADol (ULTRAM) 50 MG tablet Take 50 mg by mouth daily as needed. (Patient not taking: Reported on 01/25/2023)      Results for orders placed or performed during the hospital encounter of 01/26/23 (from the past 48 hour(s))  Glucose, capillary     Status: None   Collection Time: 01/26/23  6:37 AM  Result Value Ref Range   Glucose-Capillary 95 70 - 99 mg/dL    Comment: Glucose reference range applies only to samples taken after fasting for at least 8 hours.   Comment 1 Notify RN    X-ray chest PA or AP  Result Date: 01/26/2023 CLINICAL DATA:  Postoperative radiograph for ECT. EXAM: CHEST  1 VIEW COMPARISON:  November 02, 2021 FINDINGS: EKG leads project over the chest. Cardiomediastinal contours and hilar structures are stable. There is near confluent airspace disease in the RIGHT mid and lower chest and perihilar opacities in the LEFT mid chest and lower chest. No sign of pneumothorax. On limited assessment there is no acute skeletal process. IMPRESSION: Near confluent airspace disease  in the RIGHT mid and lower chest and perihilar opacities in the LEFT mid chest and lower chest. Findings are suspicious for pneumonia particularly at the RIGHT lung base, consider aspiration. These results will be called to the ordering clinician or representative by the Radiologist Assistant, and communication documented in the PACS or [employer]. Electronically Signed   By: Zetta Bills M.D.   On: 01/26/2023 14:25    Review of Systems  Constitutional: Negative.   HENT: Negative.    Eyes: Negative.   Respiratory: Negative.    Cardiovascular: Negative.   Gastrointestinal: Negative.   Musculoskeletal: Negative.   Skin: Negative.   Neurological:  Positive for weakness.       Patient has chronic quadriplegia  with severe deficits in all limbs.  Currently at baseline.  Psychiatric/Behavioral:  Positive for dysphoric mood.     Blood pressure 104/81, pulse 84, temperature (!) 97 F (36.1 C), resp. rate (!) 31, height 5' (1.524 m), weight 79.4 kg, SpO2 92 %. Physical Exam Vitals and nursing note reviewed.  Constitutional:      Appearance: She is well-developed.  HENT:     Head: Normocephalic and atraumatic.  Eyes:     Conjunctiva/sclera: Conjunctivae normal.     Pupils: Pupils are equal, round, and reactive to light.  Cardiovascular:     Heart sounds: Normal heart sounds.  Pulmonary:     Effort: Pulmonary effort is normal.  Abdominal:     Palpations: Abdomen is soft.  Musculoskeletal:        General: Normal range of motion.     Cervical back: Normal range of motion.  Skin:    General: Skin is warm and dry.  Neurological:     Mental Status: She is alert.     Comments: Chronic quadriplegia all symptoms the same as usual.  Psychiatric:        Attention and Perception: Attention normal.        Mood and Affect: Mood is depressed. Affect is blunt.        Speech: Speech is delayed.        Behavior: Behavior is slowed.        Thought Content: Thought content does not include suicidal ideation.      Assessment/Plan Patient is back in the hospital to receive ECT because she is not able to do it as an outpatient.  Treat today and then plan likely for Friday.  Alethia Berthold, MD 01/26/2023, 3:25 PM

## 2023-01-26 NOTE — Progress Notes (Signed)
Unable to give discharge instructions- transferred to medical floor.

## 2023-01-27 DIAGNOSIS — J9601 Acute respiratory failure with hypoxia: Secondary | ICD-10-CM

## 2023-01-27 DIAGNOSIS — S14109S Unspecified injury at unspecified level of cervical spinal cord, sequela: Secondary | ICD-10-CM

## 2023-01-27 DIAGNOSIS — E669 Obesity, unspecified: Secondary | ICD-10-CM

## 2023-01-27 DIAGNOSIS — J690 Pneumonitis due to inhalation of food and vomit: Secondary | ICD-10-CM

## 2023-01-27 LAB — CBC WITH DIFFERENTIAL/PLATELET
Abs Immature Granulocytes: 0.1 10*3/uL — ABNORMAL HIGH (ref 0.00–0.07)
Basophils Absolute: 0 10*3/uL (ref 0.0–0.1)
Basophils Relative: 0 %
Eosinophils Absolute: 0.1 10*3/uL (ref 0.0–0.5)
Eosinophils Relative: 1 %
HCT: 38.3 % (ref 36.0–46.0)
Hemoglobin: 12 g/dL (ref 12.0–15.0)
Immature Granulocytes: 1 %
Lymphocytes Relative: 13 %
Lymphs Abs: 2.2 10*3/uL (ref 0.7–4.0)
MCH: 25.8 pg — ABNORMAL LOW (ref 26.0–34.0)
MCHC: 31.3 g/dL (ref 30.0–36.0)
MCV: 82.4 fL (ref 80.0–100.0)
Monocytes Absolute: 1.1 10*3/uL — ABNORMAL HIGH (ref 0.1–1.0)
Monocytes Relative: 6 %
Neutro Abs: 13.8 10*3/uL — ABNORMAL HIGH (ref 1.7–7.7)
Neutrophils Relative %: 79 %
Platelets: 314 10*3/uL (ref 150–400)
RBC: 4.65 MIL/uL (ref 3.87–5.11)
RDW: 15.9 % — ABNORMAL HIGH (ref 11.5–15.5)
WBC: 17.4 10*3/uL — ABNORMAL HIGH (ref 4.0–10.5)
nRBC: 0 % (ref 0.0–0.2)

## 2023-01-27 LAB — BASIC METABOLIC PANEL
Anion gap: 7 (ref 5–15)
BUN: 21 mg/dL — ABNORMAL HIGH (ref 6–20)
CO2: 25 mmol/L (ref 22–32)
Calcium: 8.9 mg/dL (ref 8.9–10.3)
Chloride: 103 mmol/L (ref 98–111)
Creatinine, Ser: 0.86 mg/dL (ref 0.44–1.00)
GFR, Estimated: >60 mL/min (ref >60)
Glucose, Bld: 102 mg/dL — ABNORMAL HIGH (ref 70–99)
Potassium: 3.9 mmol/L (ref 3.5–5.1)
Sodium: 135 mmol/L (ref 135–145)

## 2023-01-27 LAB — PROCALCITONIN: Procalcitonin: 0.53 ng/mL

## 2023-01-27 LAB — TROPONIN I (HIGH SENSITIVITY): Troponin I (High Sensitivity): 7 ng/L (ref <18)

## 2023-01-27 LAB — HIV ANTIBODY (ROUTINE TESTING W REFLEX): HIV Screen 4th Generation wRfx: NONREACTIVE

## 2023-01-27 MED ORDER — TRAMADOL HCL 50 MG PO TABS
50.0000 mg | ORAL_TABLET | Freq: Four times a day (QID) | ORAL | Status: DC | PRN
  Administered 2023-01-27 (×2): 50 mg via ORAL
  Filled 2023-01-27 (×2): qty 1

## 2023-01-27 NOTE — Assessment & Plan Note (Addendum)
Initially patient requiring 4 L.  Has been able to be weaned down to 2 L.  By day of discharge, patient able to ambulate on room air and keep oxygen saturations well above 90%.

## 2023-01-27 NOTE — Progress Notes (Signed)
Made pt aware of transfer to [facility]. Pt agreeable to shared room. Pt belongings in 3 pt belongings bags, 1 suitcase and 1 wheelchair. Pt wallet and cell phone to be transported in the bed with her per pt preference.

## 2023-01-27 NOTE — Progress Notes (Addendum)
[facility] Progress Note  Patient: Natasha [name]    [id number]  DOA: 01/26/2023    Date of Service: the patient was seen and examined on 01/27/2023  Brief hospital course: 52 year old female with past medical history of severe depression on ECT, spastic quadriplegia status post traumatic spine injury who presented initially for ECT treatment, however upon completion, noted to have shortness of breath and hypoxia and admitted for aspiration pneumonia.  Patient started on antibiotics and supplemental oxygen, initially requiring 4 L nasal cannula.  Assessment and Plan: * Aspiration pneumonia [facility]) Patient denies any prior history of dysphagia or difficulty with choking.  No history of asthma or COPD.  Chest x-ray and CT both note Multi lobar pneumonia.  Continue on IV antibiotics.  Speech therapy to see.  Procalcitonin mildly elevated.  Acute respiratory failure with hypoxia ([place]) Initially patient requiring 4 L.  Has been able to be weaned down to 2 L.  Will continue to wean and check ambulatory pulse ox.  Obesity (BMI 30-39.9) Meets criteria with BMI greater than 30  Spinal cord injury, cervical region ([place]) - Continue home baclofen  MDD (major depressive disorder), recurrent severe, without psychosis ([place]) - Continue home duloxetine, buspirone.  Status post session of ECT on 2/14.  Psychiatry followed up in the hospital who recommended no ECT on 2/16 as patient is still recovering.  Outpatient follow-up       There is no height or weight on file to calculate BMI.        Consultants: Psychiatry  Procedures: None  Antimicrobials: IV Unasyn 2/14-present  Code Status: Full code   Subjective: Breathing a little easier  Objective: Vital signs were reviewed and unremarkable. Vitals:   01/27/23 1323 01/27/23 1637  BP:  123/74  Pulse:  88  Resp: 20 15  Temp:  98.2 F (36.8 C)  SpO2:  96%    Intake/Output Summary (Last 24 hours) at 01/27/2023  1729 Last data filed at 01/27/2023 0740 Gross per 24 hour  Intake 800.72 ml  Output --  Net 800.72 ml   There were no vitals filed for this visit. There is no height or weight on file to calculate BMI.  Exam:  General: Alert and oriented x 3, no acute distress HEENT: Normocephalic, atraumatic, mucous membranes are moist Cardiovascular: Regular rate and rhythm, S1-S2 Respiratory: Mild end expiratory wheeze bilaterally Abdomen: Soft, nontender, nondistended, positive bowel sound Musculoskeletal: No clubbing or cyanosis or edema Skin: Skin breaks, tears or lesions Psychiatry: Appropriate, slightly flattened affect, no evidence of acute psychoses Neurology: No focal deficits  Data Reviewed: Procalcitonin is 0.53.  White blood cell count slightly increased to 17.4  Disposition:  Status is: Inpatient Remains inpatient appropriate because:  -Treatment of infection -Improvement in hypoxia    Anticipated discharge date: 2/16  Family Communication: Declined for me to call anyone DVT Prophylaxis: enoxaparin (LOVENOX) injection 40 mg Start: 01/26/23 2200    Author: Annita Brod ,MD 01/27/2023 5:29 PM  To reach On-call, see care teams to locate the attending and reach out via [email or web address]. Between 7PM-7AM, please contact night-coverage If you still have difficulty reaching the attending provider, please page the [facility] (Director on Call) for [facility] on amion for assistance.

## 2023-01-27 NOTE — Assessment & Plan Note (Signed)
Meets criteria with BMI greater than 30 ?

## 2023-01-27 NOTE — Consult Note (Signed)
[place] [facility] Consult   Reason for Consult: Consult follow-up this 52 year old woman with severe recurrent depression who was admitted to the medical service for aspiration pneumonia Referring Physician: [place] Pink Patient Identification: Natasha [name] MRN:  [id number] Principal Diagnosis: Aspiration pneumonia [facility]) Diagnosis:  Principal Problem:   Aspiration pneumonia ([place]) Active Problems:   MDD (major depressive disorder), recurrent severe, without psychosis ([place])   Spinal cord injury, cervical region [facility])   Total Time spent with patient: 30 minutes  Subjective:   Natasha [name] is a 52 y.o. female patient admitted with "I am feeling better".  HPI: Patient is well-known to the psychiatric service with a history of longstanding recurrent to severe depression.  She had been admitted to the psychiatric unit with a specific request to resume ECT treatment.  Patient had reported feeling fatigued and run down for some time.  ECT treatment was done without any complication during the procedure but afterwards patient's oxygen saturations remained low in recovery.  Chest x-ray showed consolidation consistent with aspiration pneumonia.  Patient seen today on the medical floor she was awake alert and pleasant.  Stated that she was feeling physically better and that her mood was improved as well.  Denied suicidal thought.  Past Psychiatric History: History of severe recurrent depression with past history of suicide attempts.  Responds well to ECT  Risk to Self:   Risk to Others:   Prior Inpatient Therapy:   Prior Outpatient Therapy:    Past Medical History:  Past Medical History:  Diagnosis Date   Anxiety    C6 spinal cord injury ([place])    Depression    OCD (obsessive compulsive disorder)    PTSD (post-traumatic stress disorder)     Past Surgical History:  Procedure Laterality Date   BACK SURGERY     NECK SURGERY     Family History:  Family History   Problem Relation Age of Onset   Alcohol abuse Father    Alcohol abuse Brother    Depression Mother    Alcohol abuse Mother    Parkinson's disease Mother    Family Psychiatric  History: See previous Social History:  Social History   Substance and Sexual Activity  Alcohol Use Yes   Alcohol/week: 0.0 standard drinks of alcohol   Comment: Occasional use     Social History   Substance and Sexual Activity  Drug Use No    Social History   Socioeconomic History   Marital status: Single    Spouse name: Not on file   Number of children: Not on file   Years of education: Not on file   Highest education level: Not on file  Occupational History   Occupation: Disability  Tobacco Use   Smoking status: Former    Packs/day: 0.00    Types: Cigarettes   Smokeless tobacco: Never  Vaping Use   Vaping Use: Never used  Substance and Sexual Activity   Alcohol use: Yes    Alcohol/week: 0.0 standard drinks of alcohol    Comment: Occasional use   Drug use: No   Sexual activity: Not Currently  Other Topics Concern   Not on file  Social History Narrative   Pt lives alone; receives outpatient psychiatry services through [facility].  Pt is on disability due to a spinal cord injury.    Social Determinants of Health   Financial Resource Strain: Not on file  Food Insecurity: No Food Insecurity (01/26/2023)   Hunger Vital Sign    Worried About  Running Out of Food in the Last Year: Never true    Ran Out of Food in the Last Year: Never true  Transportation Needs: No Transportation Needs (01/26/2023)   PRAPARE - Administrator, Civil Service (Medical): No    Lack of Transportation (Non-Medical): No  Physical Activity: Not on file  Stress: Not on file  Social Connections: Not on file   Additional Social History:    Allergies:  No Known Allergies  Labs:  Results for orders placed or performed during the hospital encounter of 01/26/23 (from the past 48 hour(s))  CBC  with Differential/Platelet     Status: Abnormal   Collection Time: 01/26/23  5:30 PM  Result Value Ref Range   WBC 15.6 (H) 4.0 - 10.5 K/uL   RBC 5.11 3.87 - 5.11 MIL/uL   Hemoglobin 13.1 12.0 - 15.0 g/dL   HCT 73.4 19.3 - 79.0 %   MCV 82.6 80.0 - 100.0 fL   MCH 25.6 (L) 26.0 - 34.0 pg   MCHC 31.0 30.0 - 36.0 g/dL   RDW 24.0 (H) 97.3 - 53.2 %   Platelets 340 150 - 400 K/uL   nRBC 0.0 0.0 - 0.2 %   Neutrophils Relative % 89 %   Neutro Abs 13.8 (H) 1.7 - 7.7 K/uL   Lymphocytes Relative 4 %   Lymphs Abs 0.7 0.7 - 4.0 K/uL   Monocytes Relative 6 %   Monocytes Absolute 0.9 0.1 - 1.0 K/uL   Eosinophils Relative 0 %   Eosinophils Absolute 0.0 0.0 - 0.5 K/uL   Basophils Relative 0 %   Basophils Absolute 0.1 0.0 - 0.1 K/uL   Immature Granulocytes 1 %   Abs Immature Granulocytes 0.08 (H) 0.00 - 0.07 K/uL    Comment: Performed at [facility], [address] [place], [place] [address]  Comprehensive metabolic panel     Status: Abnormal   Collection Time: 01/26/23  5:30 PM  Result Value Ref Range   Sodium 135 135 - 145 mmol/L   Potassium 4.3 3.5 - 5.1 mmol/L   Chloride 103 98 - 111 mmol/L   CO2 23 22 - 32 mmol/L   Glucose, Bld 144 (H) 70 - 99 mg/dL    Comment: Glucose reference range applies only to samples taken after fasting for at least 8 hours.   BUN 22 (H) 6 - 20 mg/dL   Creatinine, Ser 6.83 0.44 - 1.00 mg/dL   Calcium 9.0 8.9 - 41.9 mg/dL   Total Protein 7.2 6.5 - 8.1 g/dL   Albumin 4.0 3.5 - 5.0 g/dL   AST 29 15 - 41 U/L   ALT 25 0 - 44 U/L   Alkaline Phosphatase 81 38 - 126 U/L   Total Bilirubin 1.0 0.3 - 1.2 mg/dL   GFR, Estimated >62 >22 mL/min    Comment: (NOTE) Calculated using the CKD-EPI Creatinine Equation (2021)    Anion gap 9 5 - 15    Comment: Performed at [facility], [address] [place], [place] [address]  Procalcitonin - Baseline     Status: None   Collection Time: 01/27/23  2:52 AM  Result Value Ref Range   Procalcitonin 0.53  ng/mL    Comment:        Interpretation: PCT > 0.5 ng/mL and <= 2 ng/mL: Systemic infection (sepsis) is possible, but other conditions are known to elevate PCT as well. (NOTE)       Sepsis PCT Algorithm  Lower Respiratory Tract                                      Infection PCT Algorithm    ----------------------------     ----------------------------         PCT < 0.25 ng/mL                PCT < 0.10 ng/mL          Strongly encourage             Strongly discourage   discontinuation of antibiotics    initiation of antibiotics    ----------------------------     -----------------------------       PCT 0.25 - 0.50 ng/mL            PCT 0.10 - 0.25 ng/mL               OR       >80% decrease in PCT            Discourage initiation of                                            antibiotics      Encourage discontinuation           of antibiotics    ----------------------------     -----------------------------         PCT >= 0.50 ng/mL              PCT 0.26 - 0.50 ng/mL                AND       <80% decrease in PCT             Encourage initiation of                                             antibiotics       Encourage continuation           of antibiotics    ----------------------------     -----------------------------        PCT >= 0.50 ng/mL                  PCT > 0.50 ng/mL               AND         increase in PCT                  Strongly encourage                                      initiation of antibiotics    Strongly encourage escalation           of antibiotics                                     -----------------------------  PCT <= 0.25 ng/mL                                                 OR                                        > 80% decrease in PCT                                      Discontinue / Do not initiate                                             antibiotics  Performed at [facility], [address] [place], [place] [address]   HIV Antibody (routine testing w rflx)     Status: None   Collection Time: 01/27/23  2:55 AM  Result Value Ref Range   HIV Screen 4th Generation wRfx Non Reactive Non Reactive    Comment: Performed at [facility], [place] [address], [place], [place] [address]  CBC with Differential/Platelet     Status: Abnormal   Collection Time: 01/27/23  2:55 AM  Result Value Ref Range   WBC 17.4 (H) 4.0 - 10.5 K/uL   RBC 4.65 3.87 - 5.11 MIL/uL   Hemoglobin 12.0 12.0 - 15.0 g/dL   HCT 38.3 36.0 - 46.0 %   MCV 82.4 80.0 - 100.0 fL   MCH 25.8 (L) 26.0 - 34.0 pg   MCHC 31.3 30.0 - 36.0 g/dL   RDW 15.9 (H) 11.5 - 15.5 %   Platelets 314 150 - 400 K/uL   nRBC 0.0 0.0 - 0.2 %   Neutrophils Relative % 79 %   Neutro Abs 13.8 (H) 1.7 - 7.7 K/uL   Lymphocytes Relative 13 %   Lymphs Abs 2.2 0.7 - 4.0 K/uL   Monocytes Relative 6 %   Monocytes Absolute 1.1 (H) 0.1 - 1.0 K/uL   Eosinophils Relative 1 %   Eosinophils Absolute 0.1 0.0 - 0.5 K/uL   Basophils Relative 0 %   Basophils Absolute 0.0 0.0 - 0.1 K/uL   Immature Granulocytes 1 %   Abs Immature Granulocytes 0.10 (H) 0.00 - 0.07 K/uL    Comment: Performed at [facility], [address] [place],  [address]  Basic metabolic panel     Status: Abnormal   Collection Time: 01/27/23  2:55 AM  Result Value Ref Range   Sodium 135 135 - 145 mmol/L   Potassium 3.9 3.5 - 5.1 mmol/L   Chloride 103 98 - 111 mmol/L   CO2 25 22 - 32 mmol/L   Glucose, Bld 102 (H) 70 - 99 mg/dL    Comment: Glucose reference range applies only to samples taken after fasting for at least 8 hours.   BUN 21 (H) 6 - 20 mg/dL   Creatinine, Ser 0.86 0.44 - 1.00 mg/dL   Calcium 8.9 8.9 - 10.3 mg/dL   GFR, Estimated >60 >60 mL/min    Comment: (NOTE) Calculated using the CKD-EPI Creatinine Equation (2021)  Anion gap 7 5 - 15    Comment: Performed at [facility], [place], [place] [address]   Troponin I (High Sensitivity)     Status: None   Collection Time: 01/27/23 10:50 AM  Result Value Ref Range   Troponin I (High Sensitivity) 7 <18 ng/L    Comment: (NOTE) Elevated high sensitivity troponin I (hsTnI) values and significant  changes across serial measurements may suggest ACS but many other  chronic and acute conditions are known to elevate hsTnI results.  Refer to the "Links" section for chest pain algorithms and additional  guidance. Performed at [facility], [address] [place], [place] [address]     Current Facility-Administered Medications  Medication Dose Route Frequency Provider Last Rate Last Admin   acetaminophen (TYLENOL) tablet 650 mg  650 mg Oral Q6H PRN Jose Persia, MD   650 mg at 01/27/23 1323   Ampicillin-Sulbactam (UNASYN) 3 g in sodium chloride 0.9 % 100 mL IVPB  3 g Intravenous Q6H Jose Persia, MD 200 mL/hr at 01/27/23 1009 3 g at 01/27/23 1009   baclofen (LIORESAL) tablet 10 mg  10 mg Oral TID Jose Persia, MD   10 mg at 01/27/23 1001   busPIRone (BUSPAR) tablet 10 mg  10 mg Oral TID Jose Persia, MD   10 mg at 01/27/23 1001   clonazePAM (KLONOPIN) tablet 0.5 mg  0.5 mg Oral BID PRN Jose Persia, MD       DULoxetine (CYMBALTA) DR capsule 120 mg  120 mg Oral Daily Jose Persia, MD   120 mg at 01/27/23 1001   enoxaparin (LOVENOX) injection 40 mg  40 mg Subcutaneous QHS Jose Persia, MD   40 mg at 01/26/23 2119   HYDROmorphone (DILAUDID) injection 0.5 mg  0.5 mg Intravenous Q4H PRN Jose Persia, MD   0.5 mg at 01/27/23 0420   ipratropium-albuterol (DUONEB) 0.5-2.5 (3) MG/3ML nebulizer solution 3 mL  3 mL Nebulization Once Jose Persia, MD       lactated ringers bolus 1,000 mL  1,000 mL Intravenous Once Jose Persia, MD       QUEtiapine (SEROQUEL) tablet 100 mg  100 mg Oral QHS Jose Persia, MD   100 mg at 01/26/23 2118   traMADol (ULTRAM) tablet 50 mg  50 mg Oral Q6H PRN Annita Brod, MD   50 mg at  01/27/23 0981    Musculoskeletal: Strength & Muscle Tone: within normal limits Gait & Station: unable to stand Patient leans: N/A            Psychiatric Specialty Exam:  Presentation  General Appearance:  Appropriate for Environment  Eye Contact: Good  Speech: Clear and Coherent  Speech Volume: Normal  Handedness: Right   Mood and Affect  Mood: Depressed; Hopeless  Affect: Congruent   Thought Process  Thought Processes: Coherent  Descriptions of Associations:Intact  Orientation:Full (Time, Place and Person)  Thought Content:WDL  History of Schizophrenia/Schizoaffective disorder:No  Duration of Psychotic Symptoms:No data recorded Hallucinations:No data recorded Ideas of Reference:None  Suicidal Thoughts:No data recorded Homicidal Thoughts:No data recorded  Sensorium  Memory: Immediate Good; Recent Good; Remote Good  Judgment: Good  Insight: Good   Executive Functions  Concentration: Good  Attention Span: Good  Recall: Good  Fund of Knowledge: Good  Language: Good   Psychomotor Activity  Psychomotor Activity:No data recorded  Assets  Assets: Communication Skills; Desire for Improvement   Sleep  Sleep:No data recorded  Physical Exam: Physical Exam Vitals and nursing  note reviewed.  Constitutional:      Appearance: Normal appearance.  HENT:     Head: Normocephalic and atraumatic.     Mouth/Throat:     Pharynx: Oropharynx is clear.  Eyes:     Pupils: Pupils are equal, round, and reactive to light.  Cardiovascular:     Rate and Rhythm: Normal rate and regular rhythm.  Pulmonary:     Effort: Respiratory distress present.     Breath sounds: Normal breath sounds.  Abdominal:     General: Abdomen is flat.     Palpations: Abdomen is soft.  Musculoskeletal:        General: Normal range of motion.  Skin:    General: Skin is warm and dry.  Neurological:     Mental Status: She is alert. Mental status is at  baseline.  Psychiatric:        Attention and Perception: Attention normal.        Mood and Affect: Mood normal.        Speech: Speech normal.        Behavior: Behavior normal.        Thought Content: Thought content normal.    Review of Systems  Constitutional:  Positive for malaise/fatigue.  HENT: Negative.    Eyes: Negative.   Respiratory: Negative.    Cardiovascular: Negative.   Gastrointestinal: Negative.   Musculoskeletal: Negative.   Skin: Negative.   Neurological: Negative.   Psychiatric/Behavioral:  Positive for depression. Negative for hallucinations and suicidal ideas. The patient is not nervous/anxious.    Blood pressure 124/76, pulse 85, temperature 98.5 F (36.9 C), resp. rate 20, SpO2 100 %. There is no height or weight on file to calculate BMI.  Treatment Plan Summary: Medication management and Plan patient appears to be doing better.  I proposed to her that we not plan for ECT tomorrow as she is still on oxygen and still recovering and also her mood is already improved.  I will follow up regularly with her.  No change to medication indicated.  Disposition:  Unclear at this point if she needs to go back to the psychiatric service or not we will have to follow up regularly.  Mordecai Rasmussen, MD 01/27/2023 2:46 PM

## 2023-01-27 NOTE — Hospital Course (Addendum)
52 year old female with past medical history of severe depression on ECT, spastic quadriplegia status post traumatic spine injury who presented initially for ECT treatment, however upon completion, noted to have shortness of breath and hypoxia and admitted for aspiration pneumonia.  Patient started on antibiotics and supplemental oxygen, initially requiring 4 L nasal cannula.

## 2023-01-28 DIAGNOSIS — F332 Major depressive disorder, recurrent severe without psychotic features: Secondary | ICD-10-CM

## 2023-01-28 LAB — CBC
HCT: 35.4 % — ABNORMAL LOW (ref 36.0–46.0)
Hemoglobin: 11.1 g/dL — ABNORMAL LOW (ref 12.0–15.0)
MCH: 25.9 pg — ABNORMAL LOW (ref 26.0–34.0)
MCHC: 31.4 g/dL (ref 30.0–36.0)
MCV: 82.7 fL (ref 80.0–100.0)
Platelets: 275 10*3/uL (ref 150–400)
RBC: 4.28 MIL/uL (ref 3.87–5.11)
RDW: 15.7 % — ABNORMAL HIGH (ref 11.5–15.5)
WBC: 9.6 10*3/uL (ref 4.0–10.5)
nRBC: 0 % (ref 0.0–0.2)

## 2023-01-28 LAB — PROCALCITONIN: Procalcitonin: 0.39 ng/mL

## 2023-01-28 MED ORDER — AMOXICILLIN-POT CLAVULANATE 875-125 MG PO TABS: 1.0000 | ORAL_TABLET | Freq: Two times a day (BID) | ORAL | 0 refills | Status: AC

## 2023-01-28 NOTE — TOC Progression Note (Signed)
Transition of Care [facility]) - Progression Note    Patient Details  Name: Natasha [name] MRN: [id number] Date of Birth: 02-03-1971  Transition of Care [facility]) CM/SW Contact  Laurena Slimmer, RN Phone Number: 01/28/2023, 3:36 PM  Clinical Narrative:    Spoke with patient at bedside regarding [place] [facility].Patient is agreeable. Patient would prefer [facility].  Referral sent and received by Merleen Nicely at [facility]. TOC signing off         Expected Discharge Plan and Services         Expected Discharge Date: 01/28/23                                     Social Determinants of Health (SDOH) Interventions SDOH Screenings   Food Insecurity: No Food Insecurity (01/26/2023)  Housing: Low Risk  (01/26/2023)  Transportation Needs: No Transportation Needs (01/26/2023)  Utilities: Not At Risk (01/26/2023)  Alcohol Screen: Low Risk  (01/26/2023)  Depression (PHQ2-9): High Risk (06/07/2022)  Tobacco Use: Medium Risk (01/26/2023)    Readmission Risk Interventions     No data to display

## 2023-01-28 NOTE — Discharge Summary (Signed)
Physician Discharge Summary   Patient: Natasha [name] MRN: [id number] DOB: 12-08-1971  Admit date:     01/26/2023  Discharge date: {dischdate:26783}  Discharge Physician: Annita Brod   PCP: Harrison Mons, PA   Recommendations at discharge:  {Tip this will not be part of the note when signed- Example include specific recommendations for outpatient follow-up, pending tests to follow-up on. (Optional):26781}  ***  Discharge Diagnoses: Principal Problem:   Aspiration pneumonia ([place]) Active Problems:   Acute respiratory failure with hypoxia (H[facility])   MDD (major depressive disorder), recurrent severe, without psychosis ([place])   Spinal cord injury, cervical region ([place])   Obesity (BMI 30-39.9)  Resolved Problems:   * No resolved hospital problems. *  Hospital Course: 52 year old female with past medical history of severe depression on ECT, spastic quadriplegia status post traumatic spine injury who presented initially for ECT treatment, however upon completion, noted to have shortness of breath and hypoxia and admitted for aspiration pneumonia.  Patient started on antibiotics and supplemental oxygen, initially requiring 4 L nasal cannula.  Assessment and Plan: * Aspiration pneumonia [facility]) Patient denies any prior history of dysphagia or difficulty with choking.  No history of asthma or COPD.  Chest x-ray and CT both note Multi lobar pneumonia.  Continue on IV antibiotics.  Speech therapy to see.  Procalcitonin mildly elevated.  Acute respiratory failure with hypoxia ([place]) Initially patient requiring 4 L.  Has been able to be weaned down to 2 L.  Will continue to wean and check ambulatory pulse ox.  Obesity (BMI 30-39.9) Meets criteria with BMI greater than 30  Spinal cord injury, cervical region ([place]) - Continue home baclofen  MDD (major depressive disorder), recurrent severe, without psychosis ([place]) - Continue home duloxetine, buspirone.  Status post session of ECT  on 2/14.  Psychiatry followed up in the hospital who recommended no ECT on 2/16 as patient is still recovering.  Outpatient follow-up      {Tip this will not be part of the note when signed There is no height or weight on file to calculate BMI. , ,  (Optional):26781}  {(NOTE) Pain control PDMP Statment (Optional):26782} Consultants: *** Procedures performed: ***  Disposition: {Plan; Disposition:26390} Diet recommendation:  Discharge Diet Orders (From admission, onward)     Start     Ordered   01/28/23 0000  Diet - low sodium heart healthy        01/28/23 1341           {Diet_Plan:26776} DISCHARGE MEDICATION: Allergies as of 01/28/2023   No Known Allergies      Medication List     TAKE these medications    amoxicillin-clavulanate 875-125 MG tablet Commonly known as: AUGMENTIN Take 1 tablet by mouth 2 (two) times daily.   baclofen 10 MG tablet Commonly known as: LIORESAL Take 1 tablet (10 mg total) by mouth 3 (three) times daily.   busPIRone 10 MG tablet Commonly known as: BUSPAR Take 1 tablet (10 mg total) by mouth 3 (three) times daily.   clonazePAM 0.5 MG tablet Commonly known as: KLONOPIN Take 1 tablet (0.5 mg total) by mouth 2 (two) times daily as needed.   DULoxetine 60 MG capsule Commonly known as: CYMBALTA Take 2 capsules (120 mg total) by mouth daily.   famotidine 20 MG tablet Commonly known as: PEPCID Take 1 tablet (20 mg total) by mouth daily.   meclizine 25 MG tablet Commonly known as: ANTIVERT Take 25 mg by mouth 3 (three) times daily as needed.  QUEtiapine 100 MG tablet Commonly known as: SEROQUEL Take 1 tablet (100 mg total) by mouth at bedtime.   SUMAtriptan 100 MG tablet Commonly known as: IMITREX Take 1 tablet (100 mg total) by mouth every 2 (two) hours as needed for migraine. May repeat in 2 hours if headache persists or recurs.        Discharge Exam: There were no vitals filed for this visit. ***  Condition at  discharge: {DC Condition:26389}  The results of significant diagnostics from this hospitalization (including imaging, microbiology, ancillary and laboratory) are listed below for reference.   Imaging Studies: CT CHEST WO CONTRAST  Result Date: 01/26/2023 CLINICAL DATA:  Respiratory illness with nondiagnostic x-ray. EXAM: CT CHEST WITHOUT CONTRAST TECHNIQUE: Multidetector CT imaging of the chest was performed following the standard protocol without IV contrast. RADIATION DOSE REDUCTION: This exam was performed according to the departmental dose-optimization program which includes automated exposure control, adjustment of the mA and/or kV according to patient size and/or use of iterative reconstruction technique. COMPARISON:  Chest radiograph 01/26/2023 and 11/02/2021 FINDINGS: Cardiovascular: Normal heart size. No pericardial effusions. Normal caliber thoracic aorta. Mediastinum/Nodes: Esophagus is decompressed. No significant lymphadenopathy. Thyroid gland is unremarkable. Lungs/Pleura: There are areas of atelectasis or consolidation in both lung bases with patchy airspace disease throughout the right lung with peribronchial distribution. Changes are likely multifocal pneumonia. No pleural effusions. No pneumothorax. Upper Abdomen: No acute abnormalities. Musculoskeletal: Postoperative changes in the lower cervical spine. No acute bony abnormalities are suggested. IMPRESSION: Consolidation in both lung bases with patchy infiltrates throughout the right lung. Changes likely to represent multifocal pneumonia. Electronically Signed   By: Lucienne Capers M.D.   On: 01/26/2023 19:54   X-ray chest PA or AP  Result Date: 01/26/2023 CLINICAL DATA:  Postoperative radiograph for ECT. EXAM: CHEST  1 VIEW COMPARISON:  November 02, 2021 FINDINGS: EKG leads project over the chest. Cardiomediastinal contours and hilar structures are stable. There is near confluent airspace disease in the RIGHT mid and lower chest and  perihilar opacities in the LEFT mid chest and lower chest. No sign of pneumothorax. On limited assessment there is no acute skeletal process. IMPRESSION: Near confluent airspace disease in the RIGHT mid and lower chest and perihilar opacities in the LEFT mid chest and lower chest. Findings are suspicious for pneumonia particularly at the RIGHT lung base, consider aspiration. These results will be called to the ordering clinician or representative by the Radiologist Assistant, and communication documented in the PACS or [employer]. Electronically Signed   By: Zetta Bills M.D.   On: 01/26/2023 14:25    Microbiology: Results for orders placed or performed during the hospital encounter of 01/25/23  Resp panel by RT-PCR (RSV, Flu A&B, Covid) Anterior Nasal Swab     Status: None   Collection Time: 01/25/23  9:44 PM   Specimen: Anterior Nasal Swab  Result Value Ref Range Status   SARS Coronavirus 2 by RT PCR NEGATIVE NEGATIVE Final    Comment: (NOTE) SARS-CoV-2 target nucleic acids are NOT DETECTED.  The SARS-CoV-2 RNA is generally detectable in upper respiratory specimens during the acute phase of infection. The lowest concentration of SARS-CoV-2 viral copies this assay can detect is 138 copies/mL. A negative result does not preclude SARS-Cov-2 infection and should not be used as the sole basis for treatment or other patient management decisions. A negative result may occur with  improper specimen collection/handling, submission of specimen other than nasopharyngeal swab, presence of viral mutation(s) within the areas targeted  by this assay, and inadequate number of viral copies(<138 copies/mL). A negative result must be combined with clinical observations, patient history, and epidemiological information. The expected result is Negative.  Fact Sheet for Patients:  EntrepreneurPulse.com.au  Fact Sheet for Healthcare Providers:   IncredibleEmployment.be  This test is no t yet approved or cleared by the [place] FDA and  has been authorized for detection and/or diagnosis of SARS-CoV-2 by FDA under an Emergency Use Authorization (EUA). This EUA will remain  in effect (meaning this test can be used) for the duration of the COVID-19 declaration under Section 564(b)(1) of the Act, 21 U.S.C.section 360bbb-3(b)(1), unless the authorization is terminated  or revoked sooner.       Influenza A by PCR NEGATIVE NEGATIVE Final   Influenza B by PCR NEGATIVE NEGATIVE Final    Comment: (NOTE) The Xpert Xpress SARS-CoV-2/FLU/RSV plus assay is intended as an aid in the diagnosis of influenza from Nasopharyngeal swab specimens and should not be used as a sole basis for treatment. Nasal washings and aspirates are unacceptable for Xpert Xpress SARS-CoV-2/FLU/RSV testing.  Fact Sheet for Patients: EntrepreneurPulse.com.au  Fact Sheet for Healthcare Providers: IncredibleEmployment.be  This test is not yet approved or cleared by the [place] FDA and has been authorized for detection and/or diagnosis of SARS-CoV-2 by FDA under an Emergency Use Authorization (EUA). This EUA will remain in effect (meaning this test can be used) for the duration of the COVID-19 declaration under Section 564(b)(1) of the Act, 21 U.S.C. section 360bbb-3(b)(1), unless the authorization is terminated or revoked.     Resp Syncytial Virus by PCR NEGATIVE NEGATIVE Final    Comment: (NOTE) Fact Sheet for Patients: EntrepreneurPulse.com.au  Fact Sheet for Healthcare Providers: IncredibleEmployment.be  This test is not yet approved or cleared by the [place] FDA and has been authorized for detection and/or diagnosis of SARS-CoV-2 by FDA under an Emergency Use Authorization (EUA). This EUA will remain in effect (meaning this test can be used) for  the duration of the COVID-19 declaration under Section 564(b)(1) of the Act, 21 U.S.C. section 360bbb-3(b)(1), unless the authorization is terminated or revoked.  Performed at [facility], [address] [place], [place] [address]     Labs: CBC: Recent Labs  Lab 01/25/23 1516 01/26/23 1730 01/27/23 0255 01/28/23 0325  WBC 8.6 15.6* 17.4* 9.6  NEUTROABS  --  13.8* 13.8*  --   HGB 13.4 13.1 12.0 11.1*  HCT 42.4 42.2 38.3 35.4*  MCV 81.7 82.6 82.4 82.7  PLT 333 340 314 [id number]   Basic Metabolic Panel: Recent Labs  Lab 01/25/23 1516 01/26/23 1730 01/27/23 0255  NA 137 135 135  K 4.2 4.3 3.9  CL 102 103 103  CO2 24 23 25  $ GLUCOSE 119* 144* 102*  BUN 19 22* 21*  CREATININE 0.83 0.88 0.86  CALCIUM 9.6 9.0 8.9   Liver Function Tests: Recent Labs  Lab 01/25/23 1516 01/26/23 1730  AST 24 29  ALT 23 25  ALKPHOS 75 81  BILITOT 0.7 1.0  PROT 7.4 7.2  ALBUMIN 4.0 4.0   CBG: Recent Labs  Lab 01/26/23 0637  GLUCAP 95    Discharge time spent: {LESS THAN/GREATER THAN:26388} 30 minutes.  Signed: Annita Brod, MD [facility] 01/28/2023

## 2023-01-28 NOTE — Progress Notes (Signed)
Discharged to home, AVS reviewed, prescriptions sent electronically. IV removed, tele-box returned. Patient confirmed all belongings were returned. NT assisted patient to the exit, her friend provided transportation.

## 2023-01-28 NOTE — Evaluation (Signed)
Physical Therapy Evaluation Patient Details Name: Natasha [name] MRN: [id number] DOB: 05-12-1971 Today's Date: 01/28/2023  History of Present Illness  Natasha [name] is a 52 y.o. female with medical history significant of severe depression on ECT, spastic quadriplegia s/p traumatic spinal injury, who presents to the hospital for scheduled ECT therapy.  Postoperatively, [facility] contacted due to shortness of breath.     Ms. Datcher states that for the last 1 month, she has been experiencing a nonproductive cough that she describes as mild to moderate in nature.  Then approximately 3 weeks ago, she developed fatigue that she was initially concerned may be due to her depression.  She denies any fevers chills, nasal congestion, nausea, vomiting, diarrhea, abdominal pain before coming in today.  She denies any history of choking with food or fluids.  After her procedure, she noted shortness of breath and pleuritic chest pain.  Clinical Impression  Pt received in bed pleasant, A& O x 4. Pt has pre existing sensation impairments and gait deviations 2/2 to SCI. Pt at baseline uses B platform walker to get around her Apartment and uses W/C for simple cooking. Pt verbalized to return home as she has help as she needs and uses insta-cart for grocery shopping. No C/O pain or dizziness. SPO2 remained between 97 to 100 % at RA through out the session. Pt's nurse present during the and trained with ambulating pt to bathroom using B platform walker. Nurse demonstrated good understanding. Pt Independent with bed mobility, STS transfer with sup and ambulated 100 ft with min guard to sup using platform walker with steady gait, decrease stride and heel strike with genu valgum which is possibly pt's baseline 2/2 to SCI. Pt sitting balance normal and standing is good with A. Pt will continue to walk to the bathroom with nursing. [place] removed to increase opportunity to ambulate outside of PT session. Pt tol tx well.  PT will continue in acute care and will benefit form HHPT after acute care.       Recommendations for follow up therapy are one component of a multi-disciplinary discharge planning process, led by the attending physician.  Recommendations may be updated based on patient status, additional functional criteria and insurance authorization.  Follow Up Recommendations Home health PT      Assistance Recommended at Discharge PRN  Patient can return home with the following  A little help with walking and/or transfers;Assistance with cooking/housework;Assist for transportation;Help with stairs or ramp for entrance    Equipment Recommendations None recommended by PT (pt has all equipment.)  Recommendations for Other Services       Functional Status Assessment Patient has had a recent decline in their functional status and demonstrates the ability to make significant improvements in function in a reasonable and predictable amount of time.     Precautions / Restrictions Precautions Precautions: Fall Restrictions Weight Bearing Restrictions: No      Mobility  Bed Mobility Overal bed mobility: Independent                  Transfers Overall transfer level: Needs assistance Equipment used: Bilateral platform walker Transfers: Sit to/from Stand Sit to Stand: Supervision                Ambulation/Gait Ambulation/Gait assistance: Min guard, Supervision Gait Distance (Feet): 100 Feet Assistive device: Bilateral platform walker Gait Pattern/deviations: Decreased stride length, Step-through pattern, Decreased dorsiflexion - right, Decreased dorsiflexion - left Gait velocity: dec  Stairs: N/A            Wheelchair Mobility    Modified Rankin (Stroke Patients Only)       Balance Overall balance assessment: Needs assistance Sitting-balance support: Feet supported Sitting balance-Leahy Scale: Normal     Standing balance support: Reliant on assistive  device for balance Standing balance-Leahy Scale: Good Standing balance comment: fatigues with exertion but steady with AD                             Pertinent Vitals/Pain Pain Assessment Pain Assessment: No/denies pain    Home Living Family/patient expects to be discharged to:: Private residence Living Arrangements: Alone Available Help at Discharge: Friend(s) Type of Home: Apartment Home Access: Level entry       Home Layout: One level Home Equipment: Wheelchair - Education administrator (comment);Tub bench;Adaptive equipment (platform walker) Additional Comments: button aide    Prior Function Prior Level of Function : Independent/Modified Independent;History of Falls (last six months)             Mobility Comments:  (uses W/C and platform walker at home.) ADLs Comments: modified independent ADLs, IADLs     Hand Dominance   Dominant Hand: Right    Extremity/Trunk Assessment   Upper Extremity Assessment Upper Extremity Assessment: Overall WFL for tasks assessed    Lower Extremity Assessment Lower Extremity Assessment: Generalized weakness       Communication   Communication: No difficulties  Cognition Arousal/Alertness: Awake/alert Behavior During Therapy: WFL for tasks assessed/performed Overall Cognitive Status: Within Functional Limits for tasks assessed                                          General Comments      Exercises     Assessment/Plan    PT Assessment Patient needs continued PT services  PT Problem List Decreased strength;Decreased activity tolerance       PT Treatment Interventions Gait training;Functional mobility training;Therapeutic exercise;Therapeutic activities;Balance training;Patient/family education;Neuromuscular re-education    PT Goals (Current goals can be found in the Care Plan section)  Acute Rehab PT Goals Patient Stated Goal: " I want to go home. I have help from friends." PT Goal Formulation:  With patient Time For Goal Achievement: 02/11/23 Potential to Achieve Goals: Good    Frequency Min 2X/week     Co-evaluation               AM-PAC PT "6 Clicks" Mobility  Outcome Measure Help needed turning from your back to your side while in a flat bed without using bedrails?: None Help needed moving from lying on your back to sitting on the side of a flat bed without using bedrails?: None Help needed moving to and from a bed to a chair (including a wheelchair)?: A Little Help needed standing up from a chair using your arms (e.g., wheelchair or bedside chair)?: A Little Help needed to walk in hospital room?: A Little Help needed climbing 3-5 steps with a railing? : A Little 6 Click Score: 20    End of Session Equipment Utilized During Treatment: Gait belt Activity Tolerance: Patient tolerated treatment well;Patient limited by fatigue Patient left: in bed;with call bell/phone within reach;with chair alarm set;with nursing/sitter in room Nurse Communication: Mobility status PT Visit Diagnosis: Muscle weakness (generalized) (M62.81);Difficulty in walking, not elsewhere classified (R26.2)  Time: 1250-1313 PT Time Calculation (min) (ACUTE ONLY): 23 min   Charges:   PT Evaluation $PT Eval Low Complexity: 1 Low PT Treatments $Gait Training: 8-22 mins   Christell Steinmiller PT DPT 1:34 PM,01/28/23

## 2023-02-09 MED ORDER — PROPOFOL 10 MG/ML IV BOLUS
INTRAVENOUS | Status: AC
  Filled 2023-02-09: qty 40

## 2023-02-10 MED ORDER — PHENYLEPHRINE 80 MCG/ML (10ML) SYRINGE FOR IV PUSH (FOR BLOOD PRESSURE SUPPORT)
PREFILLED_SYRINGE | INTRAVENOUS | Status: AC
  Filled 2023-02-10: qty 10

## 2023-02-10 MED ORDER — ROCURONIUM BROMIDE 10 MG/ML (PF) SYRINGE
PREFILLED_SYRINGE | INTRAVENOUS | Status: AC
  Filled 2023-02-10: qty 10

## 2023-02-10 MED ORDER — PROPOFOL 10 MG/ML IV BOLUS
INTRAVENOUS | Status: AC
  Filled 2023-02-10: qty 20

## 2023-02-10 MED ORDER — PROPOFOL 1000 MG/100ML IV EMUL
INTRAVENOUS | Status: AC
  Filled 2023-02-10: qty 200

## 2023-02-16 MED ORDER — PROPOFOL 1000 MG/100ML IV EMUL
INTRAVENOUS | Status: AC
  Filled 2023-02-16: qty 200

## 2023-02-17 MED ORDER — BUPIVACAINE HCL (PF) 0.5 % IJ SOLN
INTRAMUSCULAR | Status: AC
  Filled 2023-02-17: qty 30

## 2023-02-21 MED ORDER — TRANEXAMIC ACID 1000 MG/10ML IV SOLN
INTRAVENOUS | Status: AC
  Filled 2023-02-21: qty 10

## 2023-03-16 MED ORDER — NOREPINEPHRINE 4 MG/250ML-% IV SOLN
INTRAVENOUS | Status: AC
  Filled 2023-03-16: qty 250

## 2023-03-16 MED ORDER — PROPOFOL 1000 MG/100ML IV EMUL
INTRAVENOUS | Status: AC
  Filled 2023-03-16: qty 100

## 2023-03-16 MED ORDER — VASOPRESSIN 20 UNIT/ML IV SOLN
INTRAVENOUS | Status: AC
  Filled 2023-03-16: qty 1

## 2023-03-16 MED ORDER — NITROGLYCERIN IN D5W 200-5 MCG/ML-% IV SOLN
INTRAVENOUS | Status: AC
  Filled 2023-03-16: qty 250

## 2023-03-18 MED ORDER — LIDOCAINE HCL (PF) 2 % IJ SOLN
INTRAMUSCULAR | Status: AC
  Filled 2023-03-18: qty 5

## 2023-03-18 MED ORDER — PROPOFOL 1000 MG/100ML IV EMUL
INTRAVENOUS | Status: AC
  Filled 2023-03-18: qty 100

## 2023-03-18 MED ORDER — PHENYLEPHRINE 80 MCG/ML (10ML) SYRINGE FOR IV PUSH (FOR BLOOD PRESSURE SUPPORT)
PREFILLED_SYRINGE | INTRAVENOUS | Status: AC
  Filled 2023-03-18: qty 40

## 2023-03-18 MED ORDER — PROPOFOL 10 MG/ML IV BOLUS
INTRAVENOUS | Status: AC
  Filled 2023-03-18: qty 20

## 2023-03-25 MED ORDER — LIDOCAINE HCL (PF) 2 % IJ SOLN
INTRAMUSCULAR | Status: AC
  Filled 2023-03-25: qty 30

## 2023-03-29 ENCOUNTER — Other Ambulatory Visit: Payer: Self-pay | Admitting: Psychiatry

## 2023-03-30 ENCOUNTER — Inpatient Hospital Stay: Admission: RE | Admit: 2023-03-30 | Payer: 59 | Source: Ambulatory Visit

## 2023-03-31 MED ORDER — LIDOCAINE-EPINEPHRINE (PF) 1 %-1:200000 IJ SOLN
INTRAMUSCULAR | Status: AC
  Filled 2023-03-31: qty 30

## 2023-04-12 MED ORDER — EPHEDRINE 5 MG/ML INJ
INTRAVENOUS | Status: AC
  Filled 2023-04-12: qty 5

## 2023-04-12 MED ORDER — PHENYLEPHRINE 80 MCG/ML (10ML) SYRINGE FOR IV PUSH (FOR BLOOD PRESSURE SUPPORT)
PREFILLED_SYRINGE | INTRAVENOUS | Status: AC
  Filled 2023-04-12: qty 10

## 2023-04-12 MED ORDER — SUCCINYLCHOLINE CHLORIDE 200 MG/10ML IV SOSY
PREFILLED_SYRINGE | INTRAVENOUS | Status: AC
  Filled 2023-04-12: qty 10

## 2023-04-12 MED ORDER — PROPOFOL 1000 MG/100ML IV EMUL
INTRAVENOUS | Status: AC
  Filled 2023-04-12: qty 100

## 2023-04-13 DEATH — deceased

## 2023-04-18 ENCOUNTER — Encounter (HOSPITAL_COMMUNITY): Payer: Self-pay

## 2023-04-27 MED ORDER — NOREPINEPHRINE 4 MG/250ML-% IV SOLN
INTRAVENOUS | Status: AC
  Filled 2023-04-27: qty 500

## 2023-04-27 MED ORDER — NITROGLYCERIN IN D5W 200-5 MCG/ML-% IV SOLN
INTRAVENOUS | Status: AC
  Filled 2023-04-27: qty 500

## 2023-04-28 MED ORDER — PROPOFOL 10 MG/ML IV BOLUS
INTRAVENOUS | Status: AC
  Filled 2023-04-28: qty 20

## 2023-04-28 MED ORDER — PHENYLEPHRINE HCL-NACL 20-0.9 MG/250ML-% IV SOLN
INTRAVENOUS | Status: AC
  Filled 2023-04-28: qty 250

## 2023-05-02 MED ORDER — PHENYLEPHRINE HCL-NACL 20-0.9 MG/250ML-% IV SOLN
INTRAVENOUS | Status: AC
  Filled 2023-05-02: qty 250

## 2023-05-04 MED ORDER — PROMETHAZINE HCL 25 MG PO TABS
ORAL_TABLET | ORAL | Status: AC
  Filled 2023-05-04: qty 1

## 2023-05-04 MED ORDER — NITROGLYCERIN IN D5W 200-5 MCG/ML-% IV SOLN
INTRAVENOUS | Status: AC
  Filled 2023-05-04: qty 250

## 2023-05-04 MED ORDER — NOREPINEPHRINE 4 MG/250ML-% IV SOLN
INTRAVENOUS | Status: AC
  Filled 2023-05-04: qty 250

## 2023-05-04 MED ORDER — OXYMETAZOLINE HCL 0.05 % NA SOLN
NASAL | Status: AC
  Filled 2023-05-04: qty 120

## 2023-05-04 MED ORDER — ALBUMIN HUMAN 5 % IV SOLN
INTRAVENOUS | Status: AC
  Filled 2023-05-04: qty 250

## 2023-05-06 MED ORDER — PROPOFOL 1000 MG/100ML IV EMUL
INTRAVENOUS | Status: AC
  Filled 2023-05-06: qty 300

## 2023-05-06 MED ORDER — GLYCOPYRROLATE 0.2 MG/ML IJ SOLN
INTRAMUSCULAR | Status: AC
  Filled 2023-05-06: qty 1

## 2023-05-06 MED ORDER — DEXMEDETOMIDINE HCL IN NACL 80 MCG/20ML IV SOLN
INTRAVENOUS | Status: AC
  Filled 2023-05-06: qty 20

## 2023-05-13 MED ORDER — EPHEDRINE 5 MG/ML INJ
INTRAVENOUS | Status: AC
  Filled 2023-05-13: qty 5

## 2023-05-13 MED ORDER — PHENYLEPHRINE 80 MCG/ML (10ML) SYRINGE FOR IV PUSH (FOR BLOOD PRESSURE SUPPORT)
PREFILLED_SYRINGE | INTRAVENOUS | Status: AC
  Filled 2023-05-13: qty 10

## 2023-05-13 MED ORDER — PROPOFOL 1000 MG/100ML IV EMUL
INTRAVENOUS | Status: AC
  Filled 2023-05-13: qty 100

## 2023-05-13 MED ORDER — SUCCINYLCHOLINE CHLORIDE 200 MG/10ML IV SOSY
PREFILLED_SYRINGE | INTRAVENOUS | Status: AC
  Filled 2023-05-13: qty 10

## 2023-05-15 MED ORDER — PROPOFOL 1000 MG/100ML IV EMUL
INTRAVENOUS | Status: AC
  Filled 2023-05-15: qty 100

## 2023-05-15 MED ORDER — PHENYLEPHRINE 80 MCG/ML (10ML) SYRINGE FOR IV PUSH (FOR BLOOD PRESSURE SUPPORT)
PREFILLED_SYRINGE | INTRAVENOUS | Status: AC
  Filled 2023-05-15: qty 20

## 2023-05-15 MED ORDER — EPHEDRINE 5 MG/ML INJ
INTRAVENOUS | Status: AC
  Filled 2023-05-15: qty 5

## 2023-05-15 MED ORDER — DEXMEDETOMIDINE HCL IN NACL 80 MCG/20ML IV SOLN
INTRAVENOUS | Status: AC
  Filled 2023-05-15: qty 20

## 2023-05-16 MED ORDER — PHENYLEPHRINE HCL-NACL 20-0.9 MG/250ML-% IV SOLN
INTRAVENOUS | Status: AC
  Filled 2023-05-16: qty 250

## 2023-05-16 MED ORDER — PROPOFOL 1000 MG/100ML IV EMUL
INTRAVENOUS | Status: AC
  Filled 2023-05-16: qty 500

## 2023-05-16 MED ORDER — PROPOFOL 10 MG/ML IV BOLUS
INTRAVENOUS | Status: AC
  Filled 2023-05-16: qty 20

## 2023-05-17 MED ORDER — SODIUM CHLORIDE (PF) 0.9 % IJ SOLN
INTRAMUSCULAR | Status: AC
  Filled 2023-05-17: qty 10

## 2023-05-19 MED ORDER — ESMOLOL HCL 100 MG/10ML IV SOLN
INTRAVENOUS | Status: AC
  Filled 2023-05-19: qty 10

## 2023-05-19 MED ORDER — EPINEPHRINE PF 1 MG/ML IJ SOLN
INTRAMUSCULAR | Status: AC
  Filled 2023-05-19: qty 1

## 2023-05-19 MED ORDER — NOREPINEPHRINE 4 MG/250ML-% IV SOLN
INTRAVENOUS | Status: AC
  Filled 2023-05-19: qty 250

## 2023-05-19 MED ORDER — PROPOFOL 1000 MG/100ML IV EMUL
INTRAVENOUS | Status: AC
  Filled 2023-05-19: qty 100

## 2023-05-19 MED ORDER — EPINEPHRINE 1 MG/10ML IJ SOSY
PREFILLED_SYRINGE | INTRAMUSCULAR | Status: AC
  Filled 2023-05-19: qty 10

## 2023-05-19 MED ORDER — SUCCINYLCHOLINE CHLORIDE 200 MG/10ML IV SOSY
PREFILLED_SYRINGE | INTRAVENOUS | Status: AC
  Filled 2023-05-19: qty 10

## 2023-05-19 MED ORDER — EPHEDRINE 5 MG/ML INJ
INTRAVENOUS | Status: AC
  Filled 2023-05-19: qty 5

## 2023-05-19 MED ORDER — PROPOFOL 10 MG/ML IV BOLUS
INTRAVENOUS | Status: AC
  Filled 2023-05-19: qty 40

## 2023-05-19 MED ORDER — ALBUMIN HUMAN 5 % IV SOLN
INTRAVENOUS | Status: AC
  Filled 2023-05-19: qty 250

## 2023-05-19 MED ORDER — PHENYLEPHRINE 80 MCG/ML (10ML) SYRINGE FOR IV PUSH (FOR BLOOD PRESSURE SUPPORT)
PREFILLED_SYRINGE | INTRAVENOUS | Status: AC
  Filled 2023-05-19: qty 10

## 2023-05-20 MED ORDER — PROPOFOL 1000 MG/100ML IV EMUL
INTRAVENOUS | Status: AC
  Filled 2023-05-20: qty 100

## 2023-05-26 MED ORDER — PROPOFOL 1000 MG/100ML IV EMUL
INTRAVENOUS | Status: AC
  Filled 2023-05-26: qty 700

## 2023-05-26 MED ORDER — LIDOCAINE HCL (PF) 2 % IJ SOLN
INTRAMUSCULAR | Status: AC
  Filled 2023-05-26: qty 20

## 2023-06-03 MED ORDER — HYDRALAZINE HCL 20 MG/ML IJ SOLN
INTRAMUSCULAR | Status: AC
  Filled 2023-06-03: qty 1

## 2023-06-15 MED ORDER — LIDOCAINE HCL (PF) 2 % IJ SOLN
INTRAMUSCULAR | Status: AC
  Filled 2023-06-15: qty 5

## 2023-06-15 MED ORDER — GLYCOPYRROLATE 0.2 MG/ML IJ SOLN
INTRAMUSCULAR | Status: AC
  Filled 2023-06-15: qty 2

## 2023-06-15 MED ORDER — DEXMEDETOMIDINE HCL IN NACL 80 MCG/20ML IV SOLN
INTRAVENOUS | Status: AC
  Filled 2023-06-15: qty 20

## 2023-06-15 MED ORDER — PHENYLEPHRINE 80 MCG/ML (10ML) SYRINGE FOR IV PUSH (FOR BLOOD PRESSURE SUPPORT)
PREFILLED_SYRINGE | INTRAVENOUS | Status: AC
  Filled 2023-06-15: qty 10

## 2023-06-15 MED ORDER — PROPOFOL 1000 MG/100ML IV EMUL
INTRAVENOUS | Status: AC
  Filled 2023-06-15: qty 200

## 2023-06-15 MED ORDER — SUCCINYLCHOLINE CHLORIDE 200 MG/10ML IV SOSY
PREFILLED_SYRINGE | INTRAVENOUS | Status: AC
  Filled 2023-06-15: qty 10

## 2023-07-07 MED ORDER — PROPOFOL 10 MG/ML IV BOLUS
INTRAVENOUS | Status: AC
  Filled 2023-07-07: qty 40

## 2023-07-07 MED ORDER — PHENYLEPHRINE HCL-NACL 20-0.9 MG/250ML-% IV SOLN
INTRAVENOUS | Status: AC
  Filled 2023-07-07: qty 250

## 2023-07-14 MED ORDER — PROPOFOL 10 MG/ML IV BOLUS
INTRAVENOUS | Status: AC
  Filled 2023-07-14: qty 20

## 2023-07-20 MED ORDER — METOCLOPRAMIDE HCL 5 MG/ML IJ SOLN
INTRAMUSCULAR | Status: AC
  Filled 2023-07-20: qty 2

## 2023-08-11 MED ORDER — PROPOFOL 10 MG/ML IV BOLUS
INTRAVENOUS | Status: AC
  Filled 2023-08-11: qty 20

## 2023-08-12 MED ORDER — CEFAZOLIN SODIUM-DEXTROSE 2-4 GM/100ML-% IV SOLN
INTRAVENOUS | Status: AC
  Filled 2023-08-12: qty 100

## 2023-08-17 MED ORDER — PROPOFOL 10 MG/ML IV BOLUS
INTRAVENOUS | Status: AC
  Filled 2023-08-17: qty 20

## 2023-08-19 MED ORDER — PROPOFOL 10 MG/ML IV BOLUS
INTRAVENOUS | Status: AC
  Filled 2023-08-19: qty 20

## 2023-08-29 MED ORDER — PHENYLEPHRINE HCL-NACL 20-0.9 MG/250ML-% IV SOLN
INTRAVENOUS | Status: AC
  Filled 2023-08-29: qty 250

## 2023-08-31 MED ORDER — PROPOFOL 10 MG/ML IV BOLUS
INTRAVENOUS | Status: AC
  Filled 2023-08-31: qty 20

## 2023-09-10 MED ORDER — NOREPINEPHRINE 4 MG/250ML-% IV SOLN
INTRAVENOUS | Status: AC
  Filled 2023-09-10: qty 250

## 2023-09-10 MED ORDER — ALBUMIN HUMAN 5 % IV SOLN
INTRAVENOUS | Status: AC
  Filled 2023-09-10: qty 500

## 2023-09-13 MED ORDER — PHENYLEPHRINE HCL-NACL 20-0.9 MG/250ML-% IV SOLN
INTRAVENOUS | Status: AC
  Filled 2023-09-13: qty 250

## 2023-09-21 MED ORDER — PROPOFOL 10 MG/ML IV BOLUS
INTRAVENOUS | Status: AC
  Filled 2023-09-21: qty 20

## 2023-09-21 MED ORDER — PROPOFOL 1000 MG/100ML IV EMUL
INTRAVENOUS | Status: AC
  Filled 2023-09-21: qty 100

## 2023-09-28 MED ORDER — EPHEDRINE 5 MG/ML INJ
INTRAVENOUS | Status: AC
  Filled 2023-09-28: qty 5

## 2023-09-28 MED ORDER — PHENYLEPHRINE 80 MCG/ML (10ML) SYRINGE FOR IV PUSH (FOR BLOOD PRESSURE SUPPORT)
PREFILLED_SYRINGE | INTRAVENOUS | Status: AC
  Filled 2023-09-28: qty 10

## 2023-09-28 MED ORDER — PROPOFOL 1000 MG/100ML IV EMUL
INTRAVENOUS | Status: AC
  Filled 2023-09-28: qty 100

## 2023-09-28 MED ORDER — PROPOFOL 1000 MG/100ML IV EMUL
INTRAVENOUS | Status: AC
  Filled 2023-09-28: qty 300

## 2023-09-28 MED ORDER — CHLORHEXIDINE GLUCONATE 0.12 % MT SOLN
OROMUCOSAL | Status: AC
  Filled 2023-09-28: qty 15

## 2023-09-28 MED ORDER — LIDOCAINE HCL (PF) 2 % IJ SOLN
INTRAMUSCULAR | Status: AC
  Filled 2023-09-28: qty 10

## 2023-09-29 MED ORDER — PROPOFOL 10 MG/ML IV BOLUS
INTRAVENOUS | Status: AC
  Filled 2023-09-29: qty 20
